# Patient Record
Sex: Male | Born: 1960
Health system: Southern US, Community
[De-identification: ages and names within clinical notes are randomized; demographics above are authoritative.]

## PROBLEM LIST (undated history)

## (undated) DIAGNOSIS — F0390 Unspecified dementia without behavioral disturbance: Secondary | ICD-10-CM

## (undated) DIAGNOSIS — L97509 Non-pressure chronic ulcer of other part of unspecified foot with unspecified severity: Secondary | ICD-10-CM

## (undated) DIAGNOSIS — I10 Essential (primary) hypertension: Secondary | ICD-10-CM

## (undated) DIAGNOSIS — L84 Corns and callosities: Secondary | ICD-10-CM

## (undated) DIAGNOSIS — Z973 Presence of spectacles and contact lenses: Secondary | ICD-10-CM

## (undated) DIAGNOSIS — E1165 Type 2 diabetes mellitus with hyperglycemia: Secondary | ICD-10-CM

## (undated) DIAGNOSIS — J309 Allergic rhinitis, unspecified: Principal | ICD-10-CM

## (undated) DIAGNOSIS — N185 Chronic kidney disease, stage 5: Secondary | ICD-10-CM

## (undated) DIAGNOSIS — E785 Hyperlipidemia, unspecified: Secondary | ICD-10-CM

## (undated) DIAGNOSIS — F1010 Alcohol abuse, uncomplicated: Secondary | ICD-10-CM

## (undated) DIAGNOSIS — D649 Anemia, unspecified: Secondary | ICD-10-CM

## (undated) DIAGNOSIS — J189 Pneumonia, unspecified organism: Secondary | ICD-10-CM

## (undated) DIAGNOSIS — K5730 Diverticulosis of large intestine without perforation or abscess without bleeding: Secondary | ICD-10-CM

## (undated) DIAGNOSIS — K529 Noninfective gastroenteritis and colitis, unspecified: Secondary | ICD-10-CM

## (undated) DIAGNOSIS — R7302 Impaired glucose tolerance (oral): Secondary | ICD-10-CM

## (undated) DIAGNOSIS — I639 Cerebral infarction, unspecified: Secondary | ICD-10-CM

## (undated) DIAGNOSIS — E7800 Pure hypercholesterolemia, unspecified: Secondary | ICD-10-CM

## (undated) DIAGNOSIS — M1990 Unspecified osteoarthritis, unspecified site: Secondary | ICD-10-CM

## (undated) DIAGNOSIS — J45909 Unspecified asthma, uncomplicated: Secondary | ICD-10-CM

## (undated) HISTORY — DX: Pneumonia, unspecified organism: J18.9

## (undated) HISTORY — DX: Alcohol abuse, uncomplicated: F10.10

## (undated) HISTORY — DX: Impaired glucose tolerance (oral): R73.02

## (undated) HISTORY — DX: Noninfective gastroenteritis and colitis, unspecified: K52.9

## (undated) HISTORY — DX: Diverticulosis of large intestine without perforation or abscess without bleeding: K57.30

## (undated) HISTORY — DX: Type 2 diabetes mellitus with hyperglycemia: E11.65

## (undated) HISTORY — DX: Corns and callosities: L84

## (undated) HISTORY — DX: Hyperlipidemia, unspecified: E78.5

## (undated) HISTORY — DX: Unspecified asthma, uncomplicated: J45.909

## (undated) HISTORY — DX: Unspecified dementia without behavioral disturbance: F03.90

## (undated) HISTORY — DX: Essential (primary) hypertension: I10

## (undated) HISTORY — DX: Anemia, unspecified: D64.9

## (undated) HISTORY — DX: Non-pressure chronic ulcer of other part of unspecified foot with unspecified severity: L97.509

## (undated) HISTORY — DX: Allergic rhinitis, unspecified: J30.9

---

## 1983-07-13 HISTORY — PX: TONSILLECTOMY: SUR1361

## 2009-10-28 ENCOUNTER — Inpatient Hospital Stay (HOSPITAL_COMMUNITY): Admission: EM | Admit: 2009-10-28 | Discharge: 2009-11-11 | Payer: Self-pay | Admitting: Emergency Medicine

## 2010-09-24 ENCOUNTER — Emergency Department: Payer: Self-pay | Admitting: Emergency Medicine

## 2010-09-29 LAB — GLUCOSE, CAPILLARY
Glucose-Capillary: 126 mg/dL — ABNORMAL HIGH (ref 70–99)
Glucose-Capillary: 130 mg/dL — ABNORMAL HIGH (ref 70–99)
Glucose-Capillary: 142 mg/dL — ABNORMAL HIGH (ref 70–99)
Glucose-Capillary: 146 mg/dL — ABNORMAL HIGH (ref 70–99)
Glucose-Capillary: 95 mg/dL (ref 70–99)
Glucose-Capillary: 100 mg/dL — ABNORMAL HIGH (ref 70–99)
Glucose-Capillary: 102 mg/dL — ABNORMAL HIGH (ref 70–99)
Glucose-Capillary: 107 mg/dL — ABNORMAL HIGH (ref 70–99)
Glucose-Capillary: 108 mg/dL — ABNORMAL HIGH (ref 70–99)
Glucose-Capillary: 109 mg/dL — ABNORMAL HIGH (ref 70–99)
Glucose-Capillary: 112 mg/dL — ABNORMAL HIGH (ref 70–99)
Glucose-Capillary: 114 mg/dL — ABNORMAL HIGH (ref 70–99)
Glucose-Capillary: 123 mg/dL — ABNORMAL HIGH (ref 70–99)
Glucose-Capillary: 129 mg/dL — ABNORMAL HIGH (ref 70–99)
Glucose-Capillary: 130 mg/dL — ABNORMAL HIGH (ref 70–99)
Glucose-Capillary: 133 mg/dL — ABNORMAL HIGH (ref 70–99)
Glucose-Capillary: 142 mg/dL — ABNORMAL HIGH (ref 70–99)
Glucose-Capillary: 143 mg/dL — ABNORMAL HIGH (ref 70–99)
Glucose-Capillary: 168 mg/dL — ABNORMAL HIGH (ref 70–99)
Glucose-Capillary: 187 mg/dL — ABNORMAL HIGH (ref 70–99)
Glucose-Capillary: 188 mg/dL — ABNORMAL HIGH (ref 70–99)
Glucose-Capillary: 62 mg/dL — ABNORMAL LOW (ref 70–99)
Glucose-Capillary: 93 mg/dL (ref 70–99)
Glucose-Capillary: 95 mg/dL (ref 70–99)
Glucose-Capillary: 96 mg/dL (ref 70–99)
Glucose-Capillary: 96 mg/dL (ref 70–99)
Glucose-Capillary: 97 mg/dL (ref 70–99)
Glucose-Capillary: 97 mg/dL (ref 70–99)
Glucose-Capillary: 99 mg/dL (ref 70–99)
Glucose-Capillary: 99 mg/dL (ref 70–99)

## 2010-09-29 LAB — LIPID PANEL
Cholesterol: 144 mg/dL (ref 0–200)
HDL: 27 mg/dL — ABNORMAL LOW (ref >39)
Total CHOL/HDL Ratio: 5.3 RATIO
Triglycerides: 145 mg/dL (ref <150)

## 2010-09-29 LAB — BASIC METABOLIC PANEL
BUN: 6 mg/dL (ref 6–23)
BUN: 7 mg/dL (ref 6–23)
BUN: 8 mg/dL (ref 6–23)
BUN: 8 mg/dL (ref 6–23)
CO2: 25 mEq/L (ref 19–32)
CO2: 25 mEq/L (ref 19–32)
CO2: 28 mEq/L (ref 19–32)
Calcium: 9.1 mg/dL (ref 8.4–10.5)
Calcium: 8.3 mg/dL — ABNORMAL LOW (ref 8.4–10.5)
Calcium: 9 mg/dL (ref 8.4–10.5)
Chloride: 105 mEq/L (ref 96–112)
Chloride: 101 mEq/L (ref 96–112)
Chloride: 102 mEq/L (ref 96–112)
Chloride: 104 mEq/L (ref 96–112)
Chloride: 105 mEq/L (ref 96–112)
Creatinine, Ser: 1.16 mg/dL (ref 0.4–1.5)
Creatinine, Ser: 1.37 mg/dL (ref 0.4–1.5)
Creatinine, Ser: 1.43 mg/dL (ref 0.4–1.5)
GFR calc Af Amer: >60 mL/min (ref >60)
GFR calc non Af Amer: 52 mL/min — ABNORMAL LOW (ref >60)
GFR calc non Af Amer: >60 mL/min (ref >60)
Glucose, Bld: 88 mg/dL (ref 70–99)
Glucose, Bld: 103 mg/dL — ABNORMAL HIGH (ref 70–99)
Glucose, Bld: 127 mg/dL — ABNORMAL HIGH (ref 70–99)
Glucose, Bld: 91 mg/dL (ref 70–99)
Potassium: 3.4 mEq/L — ABNORMAL LOW (ref 3.5–5.1)
Potassium: 3.3 mEq/L — ABNORMAL LOW (ref 3.5–5.1)
Potassium: 3.9 mEq/L (ref 3.5–5.1)
Sodium: 135 mEq/L (ref 135–145)
Sodium: 137 mEq/L (ref 135–145)

## 2010-09-29 LAB — FERRITIN: Ferritin: 1607 ng/mL — ABNORMAL HIGH (ref 22–322)

## 2010-09-29 LAB — RETICULOCYTES
RBC.: 3.01 MIL/uL — ABNORMAL LOW (ref 4.22–5.81)
Retic Count, Absolute: 39.1 10*3/uL (ref 19.0–186.0)
Retic Ct Pct: 1.3 % (ref 0.4–3.1)

## 2010-09-29 LAB — CULTURE, BLOOD (ROUTINE X 2)

## 2010-09-29 LAB — URINALYSIS, ROUTINE W REFLEX MICROSCOPIC
Glucose, UA: NEGATIVE mg/dL
Ketones, ur: 15 mg/dL — AB
pH: 5.5 (ref 5.0–8.0)

## 2010-09-29 LAB — CBC
HCT: 25.6 % — ABNORMAL LOW (ref 39.0–52.0)
HCT: 28 % — ABNORMAL LOW (ref 39.0–52.0)
HCT: 28.2 % — ABNORMAL LOW (ref 39.0–52.0)
Hemoglobin: 8.9 g/dL — ABNORMAL LOW (ref 13.0–17.0)
Hemoglobin: 9.8 g/dL — ABNORMAL LOW (ref 13.0–17.0)
MCHC: 34.4 g/dL (ref 30.0–36.0)
MCV: 104 fL — ABNORMAL HIGH (ref 78.0–100.0)
MCV: 104.7 fL — ABNORMAL HIGH (ref 78.0–100.0)
MCV: 105 fL — ABNORMAL HIGH (ref 78.0–100.0)
MCV: 106 fL — ABNORMAL HIGH (ref 78.0–100.0)
Platelets: 413 10*3/uL — ABNORMAL HIGH (ref 150–400)
Platelets: 459 10*3/uL — ABNORMAL HIGH (ref 150–400)
RBC: 2.44 MIL/uL — ABNORMAL LOW (ref 4.22–5.81)
RBC: 2.66 MIL/uL — ABNORMAL LOW (ref 4.22–5.81)
RDW: 15.6 % — ABNORMAL HIGH (ref 11.5–15.5)
WBC: 10.7 10*3/uL — ABNORMAL HIGH (ref 4.0–10.5)
WBC: 12.4 10*3/uL — ABNORMAL HIGH (ref 4.0–10.5)
WBC: 12.7 10*3/uL — ABNORMAL HIGH (ref 4.0–10.5)

## 2010-09-29 LAB — DIFFERENTIAL
Basophils Absolute: 0.1 10*3/uL (ref 0.0–0.1)
Eosinophils Absolute: 0.5 10*3/uL (ref 0.0–0.7)
Eosinophils Absolute: 0.5 10*3/uL (ref 0.0–0.7)
Lymphocytes Relative: 20 % (ref 12–46)
Lymphs Abs: 2.4 10*3/uL (ref 0.7–4.0)
Lymphs Abs: 2.4 10*3/uL (ref 0.7–4.0)
Monocytes Absolute: 1.1 10*3/uL — ABNORMAL HIGH (ref 0.1–1.0)
Monocytes Relative: 9 % (ref 3–12)
Neutro Abs: 7.2 10*3/uL (ref 1.7–7.7)
Neutro Abs: 8.3 10*3/uL — ABNORMAL HIGH (ref 1.7–7.7)
Neutrophils Relative %: 67 % (ref 43–77)
Neutrophils Relative %: 67 % (ref 43–77)

## 2010-09-29 LAB — URINE MICROSCOPIC-ADD ON

## 2010-09-29 LAB — HEMOGLOBIN A1C: Mean Plasma Glucose: 94 mg/dL (ref <117)

## 2010-09-29 LAB — FOLATE: Folate: 7.1 ng/mL

## 2010-09-29 LAB — IRON AND TIBC
Iron: 72 ug/dL (ref 42–135)
UIBC: <55 ug/dL

## 2011-08-16 ENCOUNTER — Encounter (HOSPITAL_COMMUNITY): Payer: Self-pay

## 2011-08-16 ENCOUNTER — Emergency Department (INDEPENDENT_AMBULATORY_CARE_PROVIDER_SITE_OTHER)
Admission: EM | Admit: 2011-08-16 | Discharge: 2011-08-16 | Disposition: A | Discharge status: Home / Self Care | Payer: Self-pay | Source: Home / Self Care | Attending: Family Medicine | Admitting: Family Medicine

## 2011-08-16 DIAGNOSIS — I10 Essential (primary) hypertension: Secondary | ICD-10-CM

## 2011-08-16 HISTORY — DX: Essential (primary) hypertension: I10

## 2011-08-16 MED ORDER — LISINOPRIL 40 MG PO TABS: 40.0000 mg | ORAL_TABLET | Freq: Every day | ORAL | Status: DC

## 2011-08-16 NOTE — ED Provider Notes (Signed)
History     CSN: [id number]  Arrival date & time 08/16/11  45   First MD Initiated Contact with Patient 08/16/11 1410      Chief Complaint  Patient presents with  . Medication Refill    (Consider location/radiation/quality/duration/timing/severity/associated sxs/prior treatment) Patient is a 51 y.o. male presenting with hypertension. The history is provided by the patient and a relative.  Hypertension This is a chronic problem. Episode onset: released from extended care and needs med refill until seeing lmd in 1 mo., has been on med for 2 yrs. The problem has not changed since onset.Pertinent negatives include no chest pain, no headaches and no shortness of breath.    Past Medical History  Diagnosis Date  . Diabetes mellitus   . Hypertension     History reviewed. No pertinent past surgical history.  No family history on file.  History  Substance Use Topics  . Smoking status: Current Everyday Smoker  . Smokeless tobacco: Not on file  . Alcohol Use: Yes      Review of Systems  Constitutional: Negative.   Respiratory: Negative for shortness of breath.   Cardiovascular: Negative for chest pain, palpitations and leg swelling.  Neurological: Negative for headaches.    Allergies  Review of patient's allergies indicates no known allergies.  Home Medications   Current Outpatient Rx  Name Route Sig Dispense Refill  . DILTIAZEM HCL 30 MG PO TABS Oral Take 30 mg by mouth 3 (three) times daily.    Marland Kitchen LISINOPRIL 40 MG PO TABS Oral Take 40 mg by mouth daily.    Marland Kitchen METFORMIN HCL 500 MG PO TABS Oral Take 500 mg by mouth 2 (two) times daily with a meal.    . ONE-DAILY MULTI VITAMINS PO TABS Oral Take 1 tablet by mouth daily.    Marland Kitchen LISINOPRIL 40 MG PO TABS Oral Take 1 tablet (40 mg total) by mouth daily. 30 tablet 1    BP 162/90  Pulse 83  Temp(Src) 98.4 F (36.9 C) (Oral)  Resp 20  SpO2 98%  Physical Exam  Nursing note and vitals reviewed. Constitutional: He is  oriented to person, place, and time. He appears well-developed and well-nourished.  Cardiovascular: Normal rate, regular rhythm, normal heart sounds and intact distal pulses.   Pulmonary/Chest: Effort normal and breath sounds normal.  Musculoskeletal: He exhibits no edema.  Neurological: He is alert and oriented to person, place, and time.  Skin: Skin is warm and dry.    ED Course  Procedures (including critical care time)  Labs Reviewed - No data to display No results found.   1. Hypertension       MDM          Pauline Good, MD 08/16/11 [phone]

## 2011-08-16 NOTE — ED Notes (Signed)
Needs refill on lisinopril.  Has appt with Dr Jenny Reichmann on March 11th- just moving out of assisted living facility.

## 2011-09-18 ENCOUNTER — Encounter: Payer: Self-pay | Admitting: Internal Medicine

## 2011-09-18 DIAGNOSIS — Z0000 Encounter for general adult medical examination without abnormal findings: Secondary | ICD-10-CM | POA: Insufficient documentation

## 2011-09-18 DIAGNOSIS — I10 Essential (primary) hypertension: Secondary | ICD-10-CM

## 2011-09-18 DIAGNOSIS — R7302 Impaired glucose tolerance (oral): Secondary | ICD-10-CM

## 2011-09-18 DIAGNOSIS — Z111 Encounter for screening for respiratory tuberculosis: Secondary | ICD-10-CM | POA: Insufficient documentation

## 2011-09-18 DIAGNOSIS — D649 Anemia, unspecified: Secondary | ICD-10-CM

## 2011-09-18 DIAGNOSIS — K529 Noninfective gastroenteritis and colitis, unspecified: Secondary | ICD-10-CM

## 2011-09-18 DIAGNOSIS — L97509 Non-pressure chronic ulcer of other part of unspecified foot with unspecified severity: Secondary | ICD-10-CM

## 2011-09-18 DIAGNOSIS — J189 Pneumonia, unspecified organism: Secondary | ICD-10-CM

## 2011-09-18 DIAGNOSIS — F1010 Alcohol abuse, uncomplicated: Secondary | ICD-10-CM

## 2011-09-18 DIAGNOSIS — F0390 Unspecified dementia without behavioral disturbance: Secondary | ICD-10-CM

## 2011-09-18 HISTORY — DX: Pneumonia, unspecified organism: J18.9

## 2011-09-18 HISTORY — DX: Unspecified dementia, unspecified severity, without behavioral disturbance, psychotic disturbance, mood disturbance, and anxiety: F03.90

## 2011-09-18 HISTORY — DX: Essential (primary) hypertension: I10

## 2011-09-18 HISTORY — DX: Noninfective gastroenteritis and colitis, unspecified: K52.9

## 2011-09-18 HISTORY — DX: Non-pressure chronic ulcer of other part of unspecified foot with unspecified severity: L97.509

## 2011-09-18 HISTORY — DX: Impaired glucose tolerance (oral): R73.02

## 2011-09-18 HISTORY — DX: Alcohol abuse, uncomplicated: F10.10

## 2011-09-18 HISTORY — DX: Anemia, unspecified: D64.9

## 2011-09-20 ENCOUNTER — Encounter: Payer: Self-pay | Admitting: Internal Medicine

## 2011-09-20 ENCOUNTER — Other Ambulatory Visit (INDEPENDENT_AMBULATORY_CARE_PROVIDER_SITE_OTHER): Payer: Self-pay

## 2011-09-20 ENCOUNTER — Ambulatory Visit (INDEPENDENT_AMBULATORY_CARE_PROVIDER_SITE_OTHER): Payer: Self-pay | Admitting: Internal Medicine

## 2011-09-20 VITALS — BP 132/82 | HR 83 | Temp 97.7°F | Ht 72.0 in | Wt 164.0 lb

## 2011-09-20 DIAGNOSIS — J309 Allergic rhinitis, unspecified: Secondary | ICD-10-CM

## 2011-09-20 DIAGNOSIS — J45909 Unspecified asthma, uncomplicated: Secondary | ICD-10-CM | POA: Insufficient documentation

## 2011-09-20 DIAGNOSIS — IMO0001 Reserved for inherently not codable concepts without codable children: Secondary | ICD-10-CM

## 2011-09-20 DIAGNOSIS — E1129 Type 2 diabetes mellitus with other diabetic kidney complication: Secondary | ICD-10-CM | POA: Insufficient documentation

## 2011-09-20 DIAGNOSIS — I10 Essential (primary) hypertension: Secondary | ICD-10-CM

## 2011-09-20 HISTORY — DX: Allergic rhinitis, unspecified: J30.9

## 2011-09-20 HISTORY — DX: Unspecified asthma, uncomplicated: J45.909

## 2011-09-20 HISTORY — DX: Reserved for inherently not codable concepts without codable children: IMO0001

## 2011-09-20 LAB — BASIC METABOLIC PANEL
Chloride: 100 mEq/L (ref 96–112)
Potassium: 4.2 mEq/L (ref 3.5–5.1)

## 2011-09-20 LAB — LIPID PANEL
Total CHOL/HDL Ratio: 8
VLDL: 44.6 mg/dL — ABNORMAL HIGH (ref 0.0–40.0)

## 2011-09-20 MED ORDER — LISINOPRIL 40 MG PO TABS: 40.0000 mg | ORAL_TABLET | Freq: Every day | ORAL | Status: DC

## 2011-09-20 MED ORDER — DILTIAZEM HCL 30 MG PO TABS: 30.0000 mg | ORAL_TABLET | Freq: Three times a day (TID) | ORAL | Status: DC

## 2011-09-20 MED ORDER — METFORMIN HCL 500 MG PO TABS: 500.0000 mg | ORAL_TABLET | Freq: Two times a day (BID) | ORAL | Status: DC

## 2011-09-20 NOTE — Assessment & Plan Note (Signed)
stable overall by hx and exam, most recent data reviewed with pt, and pt to continue medical treatment as before  BP Readings from Last 3 Encounters:  09/20/11 132/82  08/16/11 162/90

## 2011-09-20 NOTE — Assessment & Plan Note (Signed)
stable overall by hx and exam, most recent data reviewed with pt, and pt to continue medical treatment as before Lab Results  Component Value Date   HGBA1C  Value: 4.9 (NOTE)                                                                       According to the ADA Clinical Practice Recommendations for 2011, when HbA1c is used as a screening test:   >=6.5%   Diagnostic of Diabetes Mellitus           (if abnormal result  is confirmed)  5.7-6.4%   Increased risk of developing Diabetes Mellitus  References:Diagnosis and Classification of Diabetes Mellitus,Diabetes [id number] 1):S62-S69 and Standards of Medical Care in         Diabetes - 2011,Diabetes [id number]  (Suppl 1):S11-S61. 11/02/2009

## 2011-09-20 NOTE — Patient Instructions (Signed)
Please go to the Disability office on [facility], to see if you are eligible for Medicare  Please try the Dymista samples for nasal allergies at one spray to each side of the nose  - Twice per day After that , please take Allegra OTC 180 mg per day for allergies Continue all other medications as before Your metformin was sent to the [facility] Please go to LAB in the Basement for the blood tests to be done today Please call the phone number [phone] (the [facility]) for results of testing in 2-3 days;  When calling, simply dial the number, and when prompted enter the MRN number above (the Medical Record Number) and the # key, then the message should start. Please return in 6 months, or sooner if needed

## 2011-09-20 NOTE — Progress Notes (Signed)
  Subjective:    Patient ID: Christopher Lopez, male    DOB: [birth year], 51 y.o.   MRN: [id number]  HPI  Here as new pt to establish, hx limited due to dementia, here alone;  Does have several wks ongoing nasal allergy symptoms with clear congestion, itch and sneeze, without fever, pain, ST, cough or wheezing.  Pt denies chest pain, increased sob or doe, wheezing, orthopnea, PND, increased LE swelling, palpitations, dizziness or syncope.  Pt denies new neurological symptoms such as new headache, or facial or extremity weakness or numbness   Pt denies polydipsia, polyuria, or low sugar symptoms such as weakness or confusion improved with po intake.  Pt states overall good compliance with meds, trying to follow lower cholesterol, diabetic diet, wt overall stable but little exercise however.    Past Medical History  Diagnosis Date  . Diabetes mellitus   . Hypertension   . Pneumonia 09/18/2011  . Anemia, unspecified 09/18/2011  . HTN (hypertension) 09/18/2011  . Impaired glucose tolerance 09/18/2011  . Colitis 09/18/2011  . Dementia 09/18/2011  . Alcohol abuse 09/18/2011  . Foot ulcer 09/18/2011  . Allergic rhinitis, cause unspecified 09/20/2011  . Type II or unspecified type diabetes mellitus without mention of complication, uncontrolled 09/20/2011  . Childhood asthma 09/20/2011   Past Surgical History  Procedure Date  . Tonsillectomy     reports that he has been smoking.  He does not have any smokeless tobacco history on file. He reports that he drinks alcohol. He reports that he does not use illicit drugs. family history is not on file. No Known Allergies Current Outpatient Prescriptions on File Prior to Visit  Medication Sig Dispense Refill  . Multiple Vitamin (MULTIVITAMIN) tablet Take 1 tablet by mouth daily.       Review of Systems Review of Systems  Constitutional: Negative for diaphoresis and unexpected weight change.  HENT: Negative for drooling and tinnitus.   Eyes: Negative for photophobia and  visual disturbance.  Respiratory: Negative for choking and stridor.   Gastrointestinal: Negative for vomiting and blood in stool.  Genitourinary: Negative for hematuria and decreased urine volume.       Objective:   Physical Exam BP 132/82  Pulse 83  Temp(Src) 97.7 F (36.5 C) (Oral)  Ht 6' (1.829 m)  Wt 164 lb (74.39 kg)  BMI 22.24 kg/m2  SpO2 99% Physical Exam  VS noted Constitutional: Pt appears well-developed and well-nourished.  HENT: Head: Normocephalic.  Right Ear: External ear normal.  Left Ear: External ear normal.  Bilat tm's mild erythema.  Sinus nontender.  Pharynx mild erythema Eyes: Conjunctivae and EOM are normal. Pupils are equal, round, and reactive to light.  Neck: Normal range of motion. Neck supple.  Cardiovascular: Normal rate and regular rhythm.   Pulmonary/Chest: Effort normal and breath sounds normal.  Abd:  Soft, NT, non-distended, + BS Neurological: Pt is alert. No cranial nerve deficit.  Skin: Skin is warm. No erythema.     Assessment & Plan:

## 2011-09-20 NOTE — Assessment & Plan Note (Signed)
Gave sample dymista, and to use allegra after that,  to f/u any worsening symptoms or concerns

## 2011-09-21 ENCOUNTER — Telehealth: Payer: Self-pay | Admitting: Internal Medicine

## 2011-09-21 LAB — LDL CHOLESTEROL, DIRECT: Direct LDL: 214.6 mg/dL

## 2011-09-21 MED ORDER — LOVASTATIN 40 MG PO TABS: 40.0000 mg | ORAL_TABLET | Freq: Every day | ORAL | Status: DC

## 2011-09-21 MED ORDER — GLIPIZIDE 5 MG PO TABS: 5.0000 mg | ORAL_TABLET | Freq: Two times a day (BID) | ORAL | Status: DC

## 2011-09-21 NOTE — Telephone Encounter (Signed)
Message copied by Biagio Borg on Tue Sep 21, 2011  5:20 PM ------      Message from: Jamesetta Orleans      Created: Tue Sep 21, 2011  3:14 PM       Called the patient informed of results.  The patient informed he has been taking his metformin daily

## 2011-09-21 NOTE — Telephone Encounter (Signed)
Called informed the patient of medication additions we well as ROV in 2 months with labs.

## 2011-09-21 NOTE — Telephone Encounter (Signed)
If he has been taking all of his medication, he will need further med, both for DM and elevated cholesterol     1)  To start glipizide 5 mg twice per day    2)   Start lovastatin 40 mg per day  ROV 2 months to re-check labs

## 2011-11-08 ENCOUNTER — Encounter: Payer: Self-pay | Admitting: Internal Medicine

## 2011-11-08 ENCOUNTER — Ambulatory Visit (INDEPENDENT_AMBULATORY_CARE_PROVIDER_SITE_OTHER): Payer: Self-pay | Admitting: Internal Medicine

## 2011-11-08 ENCOUNTER — Other Ambulatory Visit: Payer: Self-pay | Admitting: Internal Medicine

## 2011-11-08 ENCOUNTER — Other Ambulatory Visit (INDEPENDENT_AMBULATORY_CARE_PROVIDER_SITE_OTHER): Payer: Self-pay

## 2011-11-08 VITALS — BP 142/92 | HR 81 | Ht 72.0 in | Wt 167.8 lb

## 2011-11-08 DIAGNOSIS — IMO0001 Reserved for inherently not codable concepts without codable children: Secondary | ICD-10-CM

## 2011-11-08 DIAGNOSIS — I10 Essential (primary) hypertension: Secondary | ICD-10-CM

## 2011-11-08 DIAGNOSIS — E785 Hyperlipidemia, unspecified: Secondary | ICD-10-CM

## 2011-11-08 HISTORY — DX: Hyperlipidemia, unspecified: E78.5

## 2011-11-08 LAB — BASIC METABOLIC PANEL
CO2: 29 mEq/L (ref 19–32)
Calcium: 9.4 mg/dL (ref 8.4–10.5)
Creatinine, Ser: 1.2 mg/dL (ref 0.4–1.5)
Glucose, Bld: 195 mg/dL — ABNORMAL HIGH (ref 70–99)

## 2011-11-08 LAB — HEPATIC FUNCTION PANEL
Albumin: 3.6 g/dL (ref 3.5–5.2)
Alkaline Phosphatase: 77 U/L (ref 39–117)
Total Protein: 8.1 g/dL (ref 6.0–8.3)

## 2011-11-08 LAB — HEMOGLOBIN A1C: Hgb A1c MFr Bld: 9.7 % — ABNORMAL HIGH (ref 4.6–6.5)

## 2011-11-08 LAB — LIPID PANEL
Cholesterol: 175 mg/dL (ref 0–200)
HDL: 45.6 mg/dL (ref >39.00)
Triglycerides: 91 mg/dL (ref 0.0–149.0)

## 2011-11-08 MED ORDER — HYDROCHLOROTHIAZIDE 12.5 MG PO TABS: 12.5000 mg | ORAL_TABLET | Freq: Every day | ORAL | Status: DC

## 2011-11-08 MED ORDER — ASPIRIN 81 MG PO TBEC: 81.0000 mg | DELAYED_RELEASE_TABLET | Freq: Every day | ORAL | Status: AC

## 2011-11-08 MED ORDER — METFORMIN HCL 1000 MG PO TABS: 1000.0000 mg | ORAL_TABLET | Freq: Two times a day (BID) | ORAL | Status: DC

## 2011-11-08 NOTE — Assessment & Plan Note (Signed)
Severe uncontroled last visit, now with better diet, on mevacor, to cont as is, goal ldl < 70, for lipids today

## 2011-11-08 NOTE — Patient Instructions (Addendum)
Take all new medications as prescribed  - the hydrochlorothiazide 12.5 mg per day, for Blood Pressure Please also start Aspirin 81 mg - 1 per day - COATED only, to reduce risk of stroke and heart attack Continue all other medications as before Please go to LAB in the Basement for the blood and/or urine tests to be done today You will be contacted by phone if any changes need to be made immediately.  Otherwise, you will receive a letter about your results with an explanation. Please return in 6 months with Lab testing done 3-5 days before

## 2011-11-08 NOTE — Progress Notes (Signed)
Subjective:    Patient ID: Christopher Lopez, male    DOB: [birth year], 51 y.o.   MRN: [id number]  HPI  Here to f/u with severe uncontrolled DM/chol/BP last visit when re-established;  Now back to usual meds and Overall good compliance with treatment, and good medicine tolerability, including the statin.  Pt denies chest pain, increased sob or doe, wheezing, orthopnea, PND, increased LE swelling, palpitations, dizziness or syncope.  Pt denies new neurological symptoms such as new headache, or facial or extremity weakness or numbness   Pt denies polydipsia, polyuria, and no low sugars.   Pt states overall good compliance with meds, trying to follow lower cholesterol, diabetic diet, wt overall stable but little exercise however. No new complaints.  Not taking ASA, No recent fever Past Medical History  Diagnosis Date  . Diabetes mellitus   . Hypertension   . Pneumonia 09/18/2011  . Anemia, unspecified 09/18/2011  . HTN (hypertension) 09/18/2011  . Impaired glucose tolerance 09/18/2011  . Colitis 09/18/2011  . Dementia 09/18/2011  . Alcohol abuse 09/18/2011  . Foot ulcer 09/18/2011  . Allergic rhinitis, cause unspecified 09/20/2011  . Type II or unspecified type diabetes mellitus without mention of complication, uncontrolled 09/20/2011  . Childhood asthma 09/20/2011   Past Surgical History  Procedure Date  . Tonsillectomy     reports that he has been smoking.  He does not have any smokeless tobacco history on file. He reports that he drinks alcohol. He reports that he does not use illicit drugs. family history is not on file. No Known Allergies Current Outpatient Prescriptions on File Prior to Visit  Medication Sig Dispense Refill  . diltiazem (CARDIZEM) 30 MG tablet Take 1 tablet (30 mg total) by mouth 3 (three) times daily.  270 tablet  3  . glipiZIDE (GLUCOTROL) 5 MG tablet Take 1 tablet (5 mg total) by mouth 2 (two) times daily before a meal.  180 tablet  3  . lisinopril (PRINIVIL,ZESTRIL) 40 MG tablet Take  1 tablet (40 mg total) by mouth daily.  90 tablet  3  . lovastatin (MEVACOR) 40 MG tablet Take 1 tablet (40 mg total) by mouth daily.  90 tablet  3  . metFORMIN (GLUCOPHAGE) 500 MG tablet Take 1 tablet (500 mg total) by mouth 2 (two) times daily with a meal.  180 tablet  3  . Multiple Vitamin (MULTIVITAMIN) tablet Take 1 tablet by mouth daily.       Review of Systems Review of Systems  Constitutional: Negative for diaphoresis and unexpected weight change.  Eyes: Negative for photophobia and visual disturbance.  Respiratory: Negative for choking and stridor.   Gastrointestinal: Negative for vomiting and blood in stool.  Genitourinary: Negative for hematuria and decreased urine volume.  Musculoskeletal: Negative for gait problem.  Skin: Negative for color change and wound.  Neurological: Negative for tremors and numbness.  Psychiatric/Behavioral: Negative for decreased concentration. The patient is not hyperactive.       Objective:   Physical Exam BP 142/92  Pulse 81  Ht 6' (1.829 m)  Wt 167 lb 12.8 oz (76.114 kg)  BMI 22.76 kg/m2  SpO2 98% Physical Exam  VS noted Constitutional: Pt appears well-developed and well-nourished.  HENT: Head: Normocephalic.  Right Ear: External ear normal.  Left Ear: External ear normal.  Eyes: Conjunctivae and EOM are normal. Pupils are equal, round, and reactive to light.  Neck: Normal range of motion. Neck supple.  Cardiovascular: Normal rate and regular rhythm.   Pulmonary/Chest: Effort normal  and breath sounds normal.  Neurological: Pt is alert. Not confused Skin: Skin is warm. No erythema.  Psychiatric: Pt behavior is normal. Thought content normal. not depressed affect or nervous    Assessment & Plan:

## 2011-11-08 NOTE — Assessment & Plan Note (Signed)
stable overall by hx and exam, most recent data reviewed with pt, and pt to continue medical treatment as before  Lab Results  Component Value Date   HGBA1C 11.5* 09/20/2011   For f/u lab today

## 2011-11-08 NOTE — Assessment & Plan Note (Addendum)
Still mild uncontrolled, to add HCTZ 12.5 qd, follwup  BP at home and next visit, to also start asa 81 mg per day  BP Readings from Last 3 Encounters:  09/20/11 132/82  08/16/11 162/90

## 2011-11-19 ENCOUNTER — Ambulatory Visit (INDEPENDENT_AMBULATORY_CARE_PROVIDER_SITE_OTHER): Payer: Self-pay | Admitting: Internal Medicine

## 2011-11-19 VITALS — BP 150/90 | HR 93 | Temp 98.1°F | Wt 164.0 lb

## 2011-11-19 DIAGNOSIS — L84 Corns and callosities: Secondary | ICD-10-CM

## 2011-11-19 DIAGNOSIS — E785 Hyperlipidemia, unspecified: Secondary | ICD-10-CM

## 2011-11-19 DIAGNOSIS — IMO0001 Reserved for inherently not codable concepts without codable children: Secondary | ICD-10-CM

## 2011-11-19 DIAGNOSIS — I10 Essential (primary) hypertension: Secondary | ICD-10-CM

## 2011-11-19 DIAGNOSIS — M79662 Pain in left lower leg: Secondary | ICD-10-CM

## 2011-11-19 MED ORDER — HYDROCHLOROTHIAZIDE 25 MG PO TABS: 25.0000 mg | ORAL_TABLET | Freq: Every day | ORAL | Status: DC

## 2011-11-19 MED ORDER — SITAGLIPTIN PHOSPHATE 100 MG PO TABS: 100.0000 mg | ORAL_TABLET | Freq: Every day | ORAL | Status: DC

## 2011-11-19 MED ORDER — GLIPIZIDE 10 MG PO TABS: 10.0000 mg | ORAL_TABLET | Freq: Two times a day (BID) | ORAL | Status: DC

## 2011-11-19 NOTE — Patient Instructions (Addendum)
OK to stop the hydrochlorothiazide 12.5 mg fluid pill Please start the hydrochlorothiazide 25 mg fluid pill Increase the glipizide to 10 mg twice per day Please start the Januvia 100 mg - 1 per day; start with the samples today, and the 30 and 90 day prescriptions are given as well as the form filled out for the [facility] patient assistance program If you are not approved for Januvia, please let us know so that we can another direction such as Lantus with their assistance program Continue all other medications as before Please return in 3 mo with Lab testing done 3-5 days before Please consider going to the [facility] for your foot exam and care

## 2011-11-20 ENCOUNTER — Encounter: Payer: Self-pay | Admitting: Internal Medicine

## 2011-11-20 DIAGNOSIS — M79662 Pain in left lower leg: Secondary | ICD-10-CM | POA: Insufficient documentation

## 2011-11-20 DIAGNOSIS — L84 Corns and callosities: Secondary | ICD-10-CM

## 2011-11-20 HISTORY — DX: Corns and callosities: L84

## 2011-11-20 NOTE — Assessment & Plan Note (Signed)
Improved but a1c still 9.7, for glipizide incr to 10 bid, add januvia 100 qd (with form filled out for [place] pt assist program, cont all other meds, gave 3 wks sample januvia as well Lab Results  Component Value Date   HGBA1C 9.7* 11/08/2011

## 2011-11-20 NOTE — Assessment & Plan Note (Signed)
No ulcer, ideally needs referral to podiatry but declines due to cost

## 2011-11-20 NOTE — Assessment & Plan Note (Addendum)
stable overall by hx and exam, most recent data reviewed with pt, and pt to increase the hctz to 25 mg qd  BP Readings from Last 3 Encounters:  11/20/11 150/90  11/08/11 142/92  09/20/11 132/82

## 2011-11-20 NOTE — Progress Notes (Signed)
Subjective:    Patient ID: Christopher Lopez, male    DOB: Jul 26, 1960, 51 y.o.   MRN: [id number]  HPI  Here to f/u with family member present;  Pt c/o pain to left calf for 1 wk, mild, sharp, persistent but worse to walk, better to sit, not assoc with trauma, fever,redness or swelling, not sure how started. Also has very dry skin to the LE's with severe dryness and cracking of skin but no redness, swelling, drainage.  Has callous to the left great toe but no ulcers.  Pt denies chest pain, increased sob or doe, wheezing, orthopnea, PND, increased LE swelling, palpitations, dizziness or syncope.  Pt denies new neurological symptoms such as new headache, or facial or extremity weakness or numbness  Pt denies polydipsia, polyuria, or low sugar symptoms such as weakness or confusion improved with po intake.  Pt states overall good compliance with meds, trying to follow lower cholesterol, diabetic diet, wt overall stable but little exercise however.    Past Medical History  Diagnosis Date  . Diabetes mellitus   . Hypertension   . Pneumonia 09/18/2011  . Anemia, unspecified 09/18/2011  . HTN (hypertension) 09/18/2011  . Impaired glucose tolerance 09/18/2011  . Colitis 09/18/2011  . Dementia 09/18/2011  . Alcohol abuse 09/18/2011  . Foot ulcer 09/18/2011  . Allergic rhinitis, cause unspecified 09/20/2011  . Type II or unspecified type diabetes mellitus without mention of complication, uncontrolled 09/20/2011  . Childhood asthma 09/20/2011  . Hyperlipidemia 11/08/2011   Past Surgical History  Procedure Date  . Tonsillectomy     reports that he has been smoking.  He does not have any smokeless tobacco history on file. He reports that he drinks alcohol. He reports that he does not use illicit drugs. family history is not on file. No Known Allergies Current Outpatient Prescriptions on File Prior to Visit  Medication Sig Dispense Refill  . aspirin 81 MG EC tablet Take 1 tablet (81 mg total) by mouth daily. Swallow whole.   30 tablet  12  . diltiazem (CARDIZEM) 30 MG tablet Take 1 tablet (30 mg total) by mouth 3 (three) times daily.  270 tablet  3  . lisinopril (PRINIVIL,ZESTRIL) 40 MG tablet Take 1 tablet (40 mg total) by mouth daily.  90 tablet  3  . lovastatin (MEVACOR) 40 MG tablet Take 1 tablet (40 mg total) by mouth daily.  90 tablet  3  . metFORMIN (GLUCOPHAGE) 1000 MG tablet Take 1 tablet (1,000 mg total) by mouth 2 (two) times daily with a meal.  180 tablet  3  . Multiple Vitamin (MULTIVITAMIN) tablet Take 1 tablet by mouth daily.      Marland Kitchen glipiZIDE (GLUCOTROL) 10 MG tablet Take 1 tablet (10 mg total) by mouth 2 (two) times daily before a meal.  180 tablet  3  . sitaGLIPtin (JANUVIA) 100 MG tablet Take 1 tablet (100 mg total) by mouth daily.  30 tablet  11   Review of Systems Review of Systems  Constitutional: Negative for diaphoresis and unexpected weight change.  HENT: Negative for drooling and tinnitus.   Eyes: Negative for photophobia and visual disturbance.  Respiratory: Negative for choking and stridor.   Gastrointestinal: Negative for vomiting and blood in stool.  Genitourinary: Negative for hematuria and decreased urine volume.  Musculoskeletal: Negative for gait problem.  Neurological: Negative for tremors and numbness.     Objective:   Physical Exam BP 150/90  Pulse 93  Temp 98.1 F (36.7 C)  Wt  164 lb (74.39 kg)  SpO2 95% Physical Exam  VS noted, not ill appearing Constitutional: Pt appears well-developed and well-nourished.  HENT: Head: Normocephalic.  Right Ear: External ear normal.  Left Ear: External ear normal.  Eyes: Conjunctivae and EOM are normal. Pupils are equal, round, and reactive to light.  Neck: Normal range of motion. Neck supple.  Cardiovascular: Normal rate and regular rhythm.   Pulmonary/Chest: Effort normal and breath sounds normal.  Neurological: Pt is alert. Left calf mild tender without cord, neg homans, no swelling, no skin change Skin: Skin is warm. No  erythema.Marked callous to right medial great toe, nontender , no ulcer , marked dryness to distal LE's    Assessment & Plan:

## 2011-11-20 NOTE — Assessment & Plan Note (Signed)
C/w strain, for tylenol prn,  to f/u any worsening symptoms or concerns

## 2011-11-20 NOTE — Assessment & Plan Note (Signed)
Lab Results  Component Value Date   LDLCALC 111* 11/08/2011   Still elev, to cont statin as is, should improve as well with better DM control

## 2012-01-14 ENCOUNTER — Other Ambulatory Visit: Payer: Self-pay | Admitting: *Deleted

## 2012-01-14 MED ORDER — LOVASTATIN 40 MG PO TABS: 40.0000 mg | ORAL_TABLET | Freq: Every day | ORAL | Status: DC

## 2012-01-14 NOTE — Telephone Encounter (Signed)
Medication Rx refilled losartent sent to [facility]

## 2012-02-17 ENCOUNTER — Other Ambulatory Visit (INDEPENDENT_AMBULATORY_CARE_PROVIDER_SITE_OTHER): Payer: Self-pay

## 2012-02-17 LAB — BASIC METABOLIC PANEL
Chloride: 100 mEq/L (ref 96–112)
GFR: 64.58 mL/min (ref >60.00)
Potassium: 3.4 mEq/L — ABNORMAL LOW (ref 3.5–5.1)
Sodium: 138 mEq/L (ref 135–145)

## 2012-02-17 LAB — LIPID PANEL
LDL Cholesterol: 90 mg/dL (ref 0–99)
Total CHOL/HDL Ratio: 4

## 2012-02-22 ENCOUNTER — Encounter: Payer: Self-pay | Admitting: Internal Medicine

## 2012-02-22 ENCOUNTER — Ambulatory Visit (INDEPENDENT_AMBULATORY_CARE_PROVIDER_SITE_OTHER): Payer: Self-pay | Admitting: Internal Medicine

## 2012-02-22 VITALS — BP 120/92 | HR 81 | Temp 98.6°F | Ht 72.0 in | Wt 168.2 lb

## 2012-02-22 DIAGNOSIS — M79662 Pain in left lower leg: Secondary | ICD-10-CM

## 2012-02-22 DIAGNOSIS — M79609 Pain in unspecified limb: Secondary | ICD-10-CM

## 2012-02-22 DIAGNOSIS — E785 Hyperlipidemia, unspecified: Secondary | ICD-10-CM

## 2012-02-22 DIAGNOSIS — IMO0001 Reserved for inherently not codable concepts without codable children: Secondary | ICD-10-CM

## 2012-02-22 DIAGNOSIS — I10 Essential (primary) hypertension: Secondary | ICD-10-CM

## 2012-02-22 NOTE — Patient Instructions (Addendum)
Continue all other medications as before You will be contacted regarding the referral for: leg circulation test (Bilateral lower extremity Dopplers) Please stop smoking Please return in 6 months, or sooner if needed

## 2012-02-22 NOTE — Assessment & Plan Note (Signed)
stable overall by hx and exam, most recent data reviewed with pt, and pt to continue medical treatment as before Lab Results  Component Value Date   LDLCALC 90 02/17/2012

## 2012-02-22 NOTE — Assessment & Plan Note (Signed)
?   cluadication vs [facility] - for LE art dopplers after sept 1 due to insurance

## 2012-02-22 NOTE — Assessment & Plan Note (Signed)
stable overall by hx and exam, most recent data reviewed with pt, and pt to continue medical treatment as before BP Readings from Last 3 Encounters:  02/22/12 120/92  11/20/11 150/90  11/08/11 142/92

## 2012-02-22 NOTE — Progress Notes (Signed)
Subjective:    Patient ID: Christopher Lopez, male    DOB: [birth year], 51 y.o.   MRN: [id number]  HPI  Here to f/u; overall doing ok,  Pt denies chest pain, increased sob or doe, wheezing, orthopnea, PND, increased LE swelling, palpitations, dizziness or syncope.  Pt denies new neurological symptoms such as new headache, or facial or extremity weakness or numbness   Pt denies polydipsia, polyuria, or low sugar symptoms such as weakness or confusion improved with po intake.  Pt states overall good compliance with meds, trying to follow lower cholesterol, diabetic diet, wt overall stable but little exercise however  Here with mother. Also mentions recurring maybe reproducible pain to the left calf on walking, without back, knee or other pain/radicular.  No falls, but maybe improved with walking with the 4 prong cane he brings today, obtained by his mother.  No leg swelling, red, tender, or trauma. Does not have insurance at this time, but mother states will have medicare after sept 1, cannot afford LE dopplers un til that time Past Medical History  Diagnosis Date  . Diabetes mellitus   . Hypertension   . Pneumonia 09/18/2011  . Anemia, unspecified 09/18/2011  . HTN (hypertension) 09/18/2011  . Impaired glucose tolerance 09/18/2011  . Colitis 09/18/2011  . Dementia 09/18/2011  . Alcohol abuse 09/18/2011  . Foot ulcer 09/18/2011  . Allergic rhinitis, cause unspecified 09/20/2011  . Type II or unspecified type diabetes mellitus without mention of complication, uncontrolled 09/20/2011  . Childhood asthma 09/20/2011  . Hyperlipidemia 11/08/2011  . Pre-ulcerative corn or callous 11/20/2011   Past Surgical History  Procedure Date  . Tonsillectomy     reports that he has been smoking.  He does not have any smokeless tobacco history on file. He reports that he drinks alcohol. He reports that he does not use illicit drugs. family history is not on file. No Known Allergies Current Outpatient Prescriptions on File Prior to  Visit  Medication Sig Dispense Refill  . aspirin 81 MG EC tablet Take 1 tablet (81 mg total) by mouth daily. Swallow whole.  30 tablet  12  . diltiazem (CARDIZEM) 30 MG tablet Take 1 tablet (30 mg total) by mouth 3 (three) times daily.  270 tablet  3  . glipiZIDE (GLUCOTROL) 10 MG tablet Take 1 tablet (10 mg total) by mouth 2 (two) times daily before a meal.  180 tablet  3  . hydrochlorothiazide (HYDRODIURIL) 25 MG tablet Take 1 tablet (25 mg total) by mouth daily.  90 tablet  3  . lisinopril (PRINIVIL,ZESTRIL) 40 MG tablet Take 1 tablet (40 mg total) by mouth daily.  90 tablet  3  . lovastatin (MEVACOR) 40 MG tablet Take 1 tablet (40 mg total) by mouth daily.  90 tablet  3  . metFORMIN (GLUCOPHAGE) 1000 MG tablet Take 1 tablet (1,000 mg total) by mouth 2 (two) times daily with a meal.  180 tablet  3  . Multiple Vitamin (MULTIVITAMIN) tablet Take 1 tablet by mouth daily.      . sitaGLIPtin (JANUVIA) 100 MG tablet Take 1 tablet (100 mg total) by mouth daily.  30 tablet  11   Review of Systems Constitutional: Negative for diaphoresis and unexpected weight change.  HENT: Negative for drooling and tinnitus.   Eyes: Negative for photophobia and visual disturbance.  Respiratory: Negative for stridor.   Gastrointestinal: Negative for vomiting and blood in stool.  Genitourinary: Negative for hematuria and decreased urine volume.  Musculoskeletal: Negative for  acute joint swelling Skin: Negative for color change and wound.  Neurological: Negative for tremors and numbness.  Psychiatric/Behavioral: Negative for decreased concentration. The patient is not hyperactive.      Objective:   Physical Exam BP 120/92  Pulse 81  Temp 98.6 F (37 C) (Oral)  Ht 6' (1.829 m)  Wt 168 lb 4 oz (76.318 kg)  BMI 22.82 kg/m2  SpO2 99% Physical Exam  VS noted Constitutional: Pt appears well-developed and well-nourished.  HENT: Head: Normocephalic.  Right Ear: External ear normal.  Left Ear: External ear  normal.  Eyes: Conjunctivae and EOM are normal. Pupils are equal, round, and reactive to light.  Neck: Normal range of motion. Neck supple.  Cardiovascular: Normal rate and regular rhythm.   Pulmonary/Chest: Effort normal and breath sounds normal.  Neurological: Pt is alert. Not confused.  Skin: Skin is warm. No erythema. No rash Psychiatric: Pt behavior is normal.    Assessment & Plan:

## 2012-02-22 NOTE — Assessment & Plan Note (Signed)
stable overall by hx and exam, most recent data reviewed with pt, and pt to continue medical treatment as before Lab Results  Component Value Date   HGBA1C 6.5 02/17/2012

## 2012-03-16 ENCOUNTER — Other Ambulatory Visit: Payer: Self-pay | Admitting: *Deleted

## 2012-03-16 DIAGNOSIS — I739 Peripheral vascular disease, unspecified: Secondary | ICD-10-CM

## 2012-03-17 ENCOUNTER — Encounter (INDEPENDENT_AMBULATORY_CARE_PROVIDER_SITE_OTHER): Payer: Medicare Other

## 2012-03-17 DIAGNOSIS — I739 Peripheral vascular disease, unspecified: Secondary | ICD-10-CM

## 2012-03-22 ENCOUNTER — Encounter: Payer: Self-pay | Admitting: Internal Medicine

## 2012-05-08 ENCOUNTER — Ambulatory Visit: Payer: Self-pay | Admitting: Internal Medicine

## 2012-08-24 ENCOUNTER — Ambulatory Visit: Payer: Self-pay | Admitting: Internal Medicine

## 2012-09-05 ENCOUNTER — Other Ambulatory Visit (INDEPENDENT_AMBULATORY_CARE_PROVIDER_SITE_OTHER): Payer: Medicare Other

## 2012-09-05 ENCOUNTER — Ambulatory Visit (INDEPENDENT_AMBULATORY_CARE_PROVIDER_SITE_OTHER): Payer: Medicare Other | Admitting: Internal Medicine

## 2012-09-05 ENCOUNTER — Encounter: Payer: Self-pay | Admitting: Internal Medicine

## 2012-09-05 VITALS — BP 142/90 | HR 93 | Temp 97.9°F | Ht 69.5 in | Wt 173.1 lb

## 2012-09-05 DIAGNOSIS — I10 Essential (primary) hypertension: Secondary | ICD-10-CM

## 2012-09-05 DIAGNOSIS — Z0000 Encounter for general adult medical examination without abnormal findings: Secondary | ICD-10-CM

## 2012-09-05 DIAGNOSIS — IMO0001 Reserved for inherently not codable concepts without codable children: Secondary | ICD-10-CM

## 2012-09-05 LAB — BASIC METABOLIC PANEL
BUN: 11 mg/dL (ref 6–23)
Chloride: 101 mEq/L (ref 96–112)
Glucose, Bld: 79 mg/dL (ref 70–99)
Potassium: 4.2 mEq/L (ref 3.5–5.1)

## 2012-09-05 LAB — HEPATIC FUNCTION PANEL
ALT: 15 U/L (ref 0–53)
AST: 18 U/L (ref 0–37)
Albumin: 3.6 g/dL (ref 3.5–5.2)
Alkaline Phosphatase: 73 U/L (ref 39–117)

## 2012-09-05 LAB — URINALYSIS, ROUTINE W REFLEX MICROSCOPIC
Leukocytes, UA: NEGATIVE
Nitrite: NEGATIVE
Urobilinogen, UA: 1 (ref 0.0–1.0)
pH: 6 (ref 5.0–8.0)

## 2012-09-05 LAB — LIPID PANEL
HDL: 44.7 mg/dL (ref >39.00)
Total CHOL/HDL Ratio: 4
Triglycerides: 87 mg/dL (ref 0.0–149.0)

## 2012-09-05 LAB — CBC WITH DIFFERENTIAL/PLATELET
Basophils Absolute: 0 10*3/uL (ref 0.0–0.1)
Eosinophils Relative: 1.6 % (ref 0.0–5.0)
MCV: 94.8 fl (ref 78.0–100.0)
Monocytes Absolute: 0.5 10*3/uL (ref 0.1–1.0)
Monocytes Relative: 4.2 % (ref 3.0–12.0)
Neutrophils Relative %: 76.9 % (ref 43.0–77.0)
Platelets: 269 10*3/uL (ref 150.0–400.0)
RDW: 13.9 % (ref 11.5–14.6)
WBC: 11.6 10*3/uL — ABNORMAL HIGH (ref 4.5–10.5)

## 2012-09-05 LAB — MICROALBUMIN / CREATININE URINE RATIO: Microalb Creat Ratio: 163.4 mg/g — ABNORMAL HIGH (ref 0.0–30.0)

## 2012-09-05 LAB — HEMOGLOBIN A1C: Hgb A1c MFr Bld: 6.5 % (ref 4.6–6.5)

## 2012-09-05 LAB — TSH: TSH: 0.93 u[IU]/mL (ref 0.35–5.50)

## 2012-09-05 MED ORDER — SITAGLIPTIN PHOSPHATE 100 MG PO TABS: 100.0000 mg | ORAL_TABLET | Freq: Every day | ORAL | Status: DC

## 2012-09-05 MED ORDER — GLIPIZIDE 10 MG PO TABS: 10.0000 mg | ORAL_TABLET | Freq: Two times a day (BID) | ORAL | Status: DC

## 2012-09-05 MED ORDER — METFORMIN HCL 1000 MG PO TABS: 1000.0000 mg | ORAL_TABLET | Freq: Two times a day (BID) | ORAL | Status: DC

## 2012-09-05 MED ORDER — LOVASTATIN 40 MG PO TABS: 40.0000 mg | ORAL_TABLET | Freq: Every day | ORAL | Status: DC

## 2012-09-05 MED ORDER — HYDROCHLOROTHIAZIDE 25 MG PO TABS: 25.0000 mg | ORAL_TABLET | Freq: Every day | ORAL | Status: DC

## 2012-09-05 MED ORDER — DILTIAZEM HCL 30 MG PO TABS: 30.0000 mg | ORAL_TABLET | Freq: Three times a day (TID) | ORAL | Status: DC

## 2012-09-05 MED ORDER — LISINOPRIL 40 MG PO TABS: 40.0000 mg | ORAL_TABLET | Freq: Every day | ORAL | Status: DC

## 2012-09-05 NOTE — Assessment & Plan Note (Signed)
stable overall by history and exam, recent data reviewed with pt, and pt to continue medical treatment as before,  to f/u any worsening symptoms or concerns BP Readings from Last 3 Encounters:  09/05/12 142/90  02/22/12 120/92  11/20/11 150/90

## 2012-09-05 NOTE — Progress Notes (Signed)
Subjective:    Patient ID: Christopher Lopez, male    DOB: 09-03-1960, 52 y.o.   MRN: [id number]  HPI  Pt here with mother, has hx of dementia and hx difficult, but Here for wellness and f/u;  Overall doing ok;  Pt denies CP, worsening SOB, DOE, wheezing, orthopnea, PND, worsening LE edema, palpitations, dizziness or syncope.  Pt denies neurological change such as new headache, facial or extremity weakness.  Pt denies polydipsia, polyuria, or low sugar symptoms. Pt states overall good compliance with treatment and medications, good tolerability, and has been trying to follow lower cholesterol diet.  Pt denies worsening depressive symptoms, suicidal ideation or panic. No fever, night sweats, wt loss, loss of appetite, or other constitutional symptoms.  Pt states good ability with ADL's, has low fall risk, home safety reviewed and adequate, no other significant changes in hearing or vision, and only occasionally active with exercise.  No acute complaints Past Medical History  Diagnosis Date  . Diabetes mellitus   . Hypertension   . Pneumonia 09/18/2011  . Anemia, unspecified 09/18/2011  . HTN (hypertension) 09/18/2011  . Impaired glucose tolerance 09/18/2011  . Colitis 09/18/2011  . Dementia 09/18/2011  . Alcohol abuse 09/18/2011  . Foot ulcer 09/18/2011  . Allergic rhinitis, cause unspecified 09/20/2011  . Type II or unspecified type diabetes mellitus without mention of complication, uncontrolled 09/20/2011  . Childhood asthma 09/20/2011  . Hyperlipidemia 11/08/2011  . Pre-ulcerative corn or callous 11/20/2011   Past Surgical History  Procedure Laterality Date  . Tonsillectomy      reports that he has been smoking.  He does not have any smokeless tobacco history on file. He reports that  drinks alcohol. He reports that he does not use illicit drugs. family history is not on file. No Known Allergies Current Outpatient Prescriptions on File Prior to Visit  Medication Sig Dispense Refill  . aspirin 81 MG EC  tablet Take 1 tablet (81 mg total) by mouth daily. Swallow whole.  30 tablet  12  . Multiple Vitamin (MULTIVITAMIN) tablet Take 1 tablet by mouth daily.       No current facility-administered medications on file prior to visit.   Review of Systems Constitutional: Negative for diaphoresis, activity change, appetite change or unexpected weight change.  HENT: Negative for hearing loss, ear pain, facial swelling, mouth sores and neck stiffness.   Eyes: Negative for pain, redness and visual disturbance.  Respiratory: Negative for shortness of breath and wheezing.   Cardiovascular: Negative for chest pain and palpitations.  Gastrointestinal: Negative for diarrhea, blood in stool, abdominal distention or other pain Genitourinary: Negative for hematuria, flank pain or change in urine volume.  Musculoskeletal: Negative for myalgias and joint swelling.  Skin: Negative for color change and wound.  Neurological: Negative for syncope and numbness. other than noted Hematological: Negative for adenopathy.  Psychiatric/Behavioral: Negative for hallucinations, self-injury, decreased concentration and agitation.      Objective:   Physical Exam BP 142/90  Pulse 93  Temp(Src) 97.9 F (36.6 C) (Oral)  Ht 5' 9.5" (1.765 m)  Wt 173 lb 2 oz (78.529 kg)  BMI 25.21 kg/m2  SpO2 98% VS noted,  Constitutional: Pt is oriented to person, place, and time. Appears well-developed and well-nourished.  Head: Normocephalic and atraumatic.  Right Ear: External ear normal.  Left Ear: External ear normal.  Nose: Nose normal.  Mouth/Throat: Oropharynx is clear and moist.  Eyes: Conjunctivae and EOM are normal. Pupils are equal, round, and reactive to light.  Neck: Normal range of motion. Neck supple. No JVD present. No tracheal deviation present.  Cardiovascular: Normal rate, regular rhythm, normal heart sounds and intact distal pulses.   Pulmonary/Chest: Effort normal and breath sounds normal.  Abdominal: Soft.  Bowel sounds are normal. There is no tenderness. No HSM  Musculoskeletal: Normal range of motion. Exhibits no edema.  Lymphadenopathy:  Has no cervical adenopathy.  Neurological: Pt is alert and oriented to person, place, and time. Pt has normal reflexes. No cranial nerve deficit.  Skin: Skin is warm and dry. No rash noted.  Psychiatric:  Has  normal mood and affect. Behavior is normal.     Assessment & Plan:

## 2012-09-05 NOTE — Assessment & Plan Note (Signed)

## 2012-09-05 NOTE — Assessment & Plan Note (Signed)
stable overall by history and exam, recent data reviewed with pt, and pt to continue medical treatment as before,  to f/u any worsening symptoms or concerns Lab Results  Component Value Date   HGBA1C 6.5 09/05/2012

## 2012-09-05 NOTE — Patient Instructions (Addendum)
You had the tetanus shot today Please continue all other medications as before, and refills have been done if requested. Please have the pharmacy call with any other refills you may need. Please go to the LAB in the Basement (turn left off the elevator) for the tests to be done today You will be contacted by phone if any changes need to be made immediately.  Otherwise, you will receive a letter about your results with an explanation, but please check with MyChart first. Please continue your efforts at being more active, low cholesterol diet, and weight control. You will be contacted regarding the referral for: colonoscopy You are otherwise up to date with prevention measures today. Thank you for enrolling in [facility]. Please follow the instructions below to securely access your online medical record. MyChart allows you to send messages to your doctor, view your test results, renew your prescriptions, schedule appointments, and more. To Log into My Chart online, please go by [employer] or [employer] to [email or web address], or download the MyChart App from the [employer].  Your Username is: Media planner (password; [email or web address]) Please return in 6 months, or sooner if needed, with Lab testing done 3-5 days before

## 2013-01-04 ENCOUNTER — Telehealth: Payer: Self-pay | Admitting: *Deleted

## 2013-01-04 MED ORDER — SITAGLIPTIN PHOSPHATE 100 MG PO TABS: 100.0000 mg | ORAL_TABLET | Freq: Every day | ORAL | Status: DC

## 2013-01-04 NOTE — Telephone Encounter (Signed)
Enid Derry, pt's mother called requesting Januvia Rx be changed to 30 day supply instead of 90 day supply.  Change updated and sent to pharmacy on file.

## 2013-02-13 ENCOUNTER — Ambulatory Visit (INDEPENDENT_AMBULATORY_CARE_PROVIDER_SITE_OTHER): Payer: Medicare Other | Admitting: Internal Medicine

## 2013-02-13 ENCOUNTER — Encounter: Payer: Self-pay | Admitting: Internal Medicine

## 2013-02-13 ENCOUNTER — Other Ambulatory Visit (INDEPENDENT_AMBULATORY_CARE_PROVIDER_SITE_OTHER): Payer: Medicare Other

## 2013-02-13 VITALS — BP 132/90 | HR 106 | Temp 97.9°F | Wt 174.4 lb

## 2013-02-13 DIAGNOSIS — Z0000 Encounter for general adult medical examination without abnormal findings: Secondary | ICD-10-CM

## 2013-02-13 DIAGNOSIS — IMO0001 Reserved for inherently not codable concepts without codable children: Secondary | ICD-10-CM

## 2013-02-13 DIAGNOSIS — K5900 Constipation, unspecified: Secondary | ICD-10-CM

## 2013-02-13 DIAGNOSIS — R6881 Early satiety: Secondary | ICD-10-CM

## 2013-02-13 DIAGNOSIS — R110 Nausea: Secondary | ICD-10-CM

## 2013-02-13 LAB — BASIC METABOLIC PANEL
CO2: 28 mEq/L (ref 19–32)
Chloride: 101 mEq/L (ref 96–112)
Sodium: 138 mEq/L (ref 135–145)

## 2013-02-13 LAB — H. PYLORI ANTIBODY, IGG: H Pylori IgG: NEGATIVE

## 2013-02-13 LAB — LIPID PANEL: Total CHOL/HDL Ratio: 4

## 2013-02-13 LAB — HEPATIC FUNCTION PANEL
ALT: 16 U/L (ref 0–53)
Alkaline Phosphatase: 51 U/L (ref 39–117)
Bilirubin, Direct: 0.1 mg/dL (ref 0.0–0.3)
Total Protein: 7.9 g/dL (ref 6.0–8.3)

## 2013-02-13 NOTE — Assessment & Plan Note (Signed)
?   Peptic vs constipation vs other - for prilosec 40 qd trial x 1 wk, wife to call for rx if helps

## 2013-02-13 NOTE — Assessment & Plan Note (Signed)
For miralax dialy asd

## 2013-02-13 NOTE — Assessment & Plan Note (Signed)
stable overall by history and exam, recent data reviewed with pt, and pt to continue medical treatment as before,  to f/u any worsening symptoms or concerns Lab Results  Component Value Date   HGBA1C 6.5 09/05/2012

## 2013-02-13 NOTE — Assessment & Plan Note (Signed)
No recent wt loss or vomiting, ok to follow for now, tx o/w as above

## 2013-02-13 NOTE — Patient Instructions (Signed)
Please take all new medication as prescribed  - the prilosec otc at 40 mg per day (the 1 wk samples given today) Please call if this helps for prescription OK to take Daily Miralax as directed Please go to the LAB in the Basement (turn left off the elevator) for the tests to be done today You will be contacted by phone if any changes need to be made immediately.  Otherwise, you will receive a letter about your results with an explanation, but please check with MyChart first.  Please remember to sign up for My Chart if you have not done so, as this will be important to you in the future with finding out test results, communicating by private email, and scheduling acute appointments online when needed.  Please call for vomiting, fever, weight loss or blood, or pain as you may need to see Gastroenterology  Please return in 6 months, or sooner if needed, with Lab testing done 3-5 days before

## 2013-02-13 NOTE — Progress Notes (Signed)
  Subjective:    Patient ID: Christopher Lopez, male    DOB: 01-20-1961, 52 y.o.   MRN: [id number]  HPI   Here to f/u; overall doing ok,  Pt denies chest pain, increased sob or doe, wheezing, orthopnea, PND, increased LE swelling, palpitations, dizziness or syncope.  Pt denies polydipsia, polyuria, or low sugar symptoms such as weakness or confusion improved with po intake.  Pt denies new neurological symptoms such as new headache, or facial or extremity weakness or numbness.   Pt states overall good compliance with meds, has been trying to follow lower cholesterol, diabetic diet, with wt overall stable,  but little exercise however.  Has intermittent constipation, as well 1 wk worsening nausea without vomiting, wt loss, dysphagia, pain, fever or blood.   Past Medical History  Diagnosis Date  . Diabetes mellitus   . Hypertension   . Pneumonia 09/18/2011  . Anemia, unspecified 09/18/2011  . HTN (hypertension) 09/18/2011  . Impaired glucose tolerance 09/18/2011  . Colitis 09/18/2011  . Dementia 09/18/2011  . Alcohol abuse 09/18/2011  . Foot ulcer 09/18/2011  . Allergic rhinitis, cause unspecified 09/20/2011  . Type II or unspecified type diabetes mellitus without mention of complication, uncontrolled 09/20/2011  . Childhood asthma 09/20/2011  . Hyperlipidemia 11/08/2011  . Pre-ulcerative corn or callous 11/20/2011   Past Surgical History  Procedure Laterality Date  . Tonsillectomy      reports that he has been smoking.  He does not have any smokeless tobacco history on file. He reports that  drinks alcohol. He reports that he does not use illicit drugs. family history is not on file. No Known Allergies  Review of Systems  Constitutional: Negative for unexpected weight change, or unusual diaphoresis  HENT: Negative for tinnitus.   Eyes: Negative for photophobia and visual disturbance.  Respiratory: Negative for choking and stridor.   Gastrointestinal: Negative for vomiting and blood in stool.  Genitourinary:  Negative for hematuria and decreased urine volume.  Musculoskeletal: Negative for acute joint swelling Skin: Negative for color change and wound.  Neurological: Negative for tremors and numbness other than noted  Psychiatric/Behavioral: Negative for decreased concentration or  hyperactivity.       Objective:   Physical Exam BP 132/90  Pulse 106  Temp(Src) 97.9 F (36.6 C) (Oral)  Wt 174 lb 6 oz (79.096 kg)  BMI 25.39 kg/m2  SpO2 98% VS noted, not ill but mild uncomfortable Constitutional: Pt appears well-developed and well-nourished.  HENT: Head: NCAT.  Right Ear: External ear normal.  Left Ear: External ear normal.  Eyes: Conjunctivae and EOM are normal. Pupils are equal, round, and reactive to light.  Neck: Normal range of motion. Neck supple.  Cardiovascular: Normal rate and regular rhythm.   Pulmonary/Chest: Effort normal and breath sounds normal.  Abd:  Soft, NT, non-distended, + BS- benign exam Neurological: Pt is alert.At baseline confused  Skin: Skin is warm. No erythema.  Psychiatric: Pt behavior is normal. Not nervous or depressed affect    Assessment & Plan:

## 2013-03-07 ENCOUNTER — Ambulatory Visit: Payer: Medicare Other | Admitting: Internal Medicine

## 2013-05-17 ENCOUNTER — Other Ambulatory Visit: Payer: Self-pay

## 2013-06-11 ENCOUNTER — Ambulatory Visit: Payer: Self-pay | Admitting: Podiatry

## 2013-07-23 ENCOUNTER — Ambulatory Visit: Payer: Self-pay | Admitting: Podiatry

## 2013-08-01 ENCOUNTER — Ambulatory Visit (INDEPENDENT_AMBULATORY_CARE_PROVIDER_SITE_OTHER): Payer: Medicare Other | Admitting: Podiatrist

## 2013-08-01 ENCOUNTER — Encounter: Payer: Self-pay | Admitting: Podiatrist

## 2013-08-01 VITALS — BP 165/106 | HR 95 | Resp 18

## 2013-08-01 DIAGNOSIS — M216X9 Other acquired deformities of unspecified foot: Secondary | ICD-10-CM

## 2013-08-01 DIAGNOSIS — M79609 Pain in unspecified limb: Secondary | ICD-10-CM

## 2013-08-01 DIAGNOSIS — B351 Tinea unguium: Secondary | ICD-10-CM

## 2013-08-01 DIAGNOSIS — L84 Corns and callosities: Secondary | ICD-10-CM

## 2013-08-01 DIAGNOSIS — E1149 Type 2 diabetes mellitus with other diabetic neurological complication: Secondary | ICD-10-CM

## 2013-08-01 DIAGNOSIS — Q828 Other specified congenital malformations of skin: Secondary | ICD-10-CM

## 2013-08-01 NOTE — Patient Instructions (Signed)
Diabetes and Foot Care Diabetes may cause you to have problems because of poor blood supply (circulation) to your feet and legs. This may cause the skin on your feet to become thinner, break easier, and heal more slowly. Your skin may become dry, and the skin may peel and crack. You may also have nerve damage in your legs and feet causing decreased feeling in them. You may not notice minor injuries to your feet that could lead to infections or more serious problems. Taking care of your feet is one of the most important things you can do for yourself.  HOME CARE INSTRUCTIONS  Wear shoes at all times, even in the house. Do not go barefoot. Bare feet are easily injured.  Check your feet daily for blisters, cuts, and redness. If you cannot see the bottom of your feet, use a mirror or ask someone for help.  Wash your feet with warm water (do not use hot water) and mild soap. Then pat your feet and the areas between your toes until they are completely dry. Do not soak your feet as this can dry your skin.  Apply a moisturizing lotion or petroleum jelly (that does not contain alcohol and is unscented) to the skin on your feet and to dry, brittle toenails. Do not apply lotion between your toes.  Trim your toenails straight across. Do not dig under them or around the cuticle. File the edges of your nails with an emery board or nail file.  Do not cut corns or calluses or try to remove them with medicine.  Wear clean socks or stockings every day. Make sure they are not too tight. Do not wear knee-high stockings since they may decrease blood flow to your legs.  Wear shoes that fit properly and have enough cushioning. To break in new shoes, wear them for just a few hours a day. This prevents you from injuring your feet. Always look in your shoes before you put them on to be sure there are no objects inside.  Do not cross your legs. This may decrease the blood flow to your feet.  If you find a minor scrape,  cut, or break in the skin on your feet, keep it and the skin around it clean and dry. These areas may be cleansed with mild soap and water. Do not cleanse the area with peroxide, alcohol, or iodine.  When you remove an adhesive bandage, be sure not to damage the skin around it.  If you have a wound, look at it several times a day to make sure it is healing.  Do not use heating pads or hot water bottles. They may burn your skin. If you have lost feeling in your feet or legs, you may not know it is happening until it is too late.  Make sure your health care provider performs a complete foot exam at least annually or more often if you have foot problems. Report any cuts, sores, or bruises to your health care provider immediately. SEEK MEDICAL CARE IF:   You have an injury that is not healing.  You have cuts or breaks in the skin.  You have an ingrown nail.  You notice redness on your legs or feet.  You feel burning or tingling in your legs or feet.  You have pain or cramps in your legs and feet.  Your legs or feet are numb.  Your feet always feel cold. SEEK IMMEDIATE MEDICAL CARE IF:   There is increasing redness,   swelling, or pain in or around a wound.  There is a red line that goes up your leg.  Pus is coming from a wound.  You develop a fever or as directed by your health care provider.  You notice a bad smell coming from an ulcer or wound. Document Released: 06/25/2000 Document Revised: 02/28/2013 Document Reviewed: 12/05/2012 Exit[employer] Patient Information 2014 [employer].  

## 2013-08-01 NOTE — Progress Notes (Signed)
  HPI: Patient presents today for follow up of diabetic foot and nail care. No new complaints today  Objective:  Neurovascular status unchanged with palpable pedal pulses and neurological sensation decreased to bilateral feet.  Severely Thick  nails  Which are elongated, thickened, discolored, dystrophic with ingrown deformity present x 10.  + hyperkeratotic and pre-ulcerative lesions present submetatarsal 4 bilateral.  Significant scaley xerosis is noted to bilateral feet.  + interdigital maceration present   Assessment: Diabetes with Neuropathy , Ingrown nail deformity, hyperkeratotic lesion x 2  Plan: Discussed treatment options and alternatives. Debrided nails as best as I could considering the severity and thickness.  Debrided hyperkeratotic lesions without complication.  Return appointment recommended at routine intervals of 3 months.or per patients request   Trudie Buckler, DPM

## 2013-08-02 ENCOUNTER — Ambulatory Visit: Payer: Self-pay | Admitting: Podiatry

## 2013-08-16 ENCOUNTER — Other Ambulatory Visit (INDEPENDENT_AMBULATORY_CARE_PROVIDER_SITE_OTHER): Payer: Medicare Other

## 2013-08-16 ENCOUNTER — Encounter: Payer: Self-pay | Admitting: Internal Medicine

## 2013-08-16 ENCOUNTER — Ambulatory Visit (INDEPENDENT_AMBULATORY_CARE_PROVIDER_SITE_OTHER): Payer: Medicare Other | Admitting: Internal Medicine

## 2013-08-16 VITALS — BP 160/98 | HR 102 | Temp 98.5°F | Wt 181.2 lb

## 2013-08-16 DIAGNOSIS — E1165 Type 2 diabetes mellitus with hyperglycemia: Secondary | ICD-10-CM

## 2013-08-16 DIAGNOSIS — IMO0001 Reserved for inherently not codable concepts without codable children: Secondary | ICD-10-CM

## 2013-08-16 DIAGNOSIS — Z0000 Encounter for general adult medical examination without abnormal findings: Secondary | ICD-10-CM

## 2013-08-16 LAB — CBC WITH DIFFERENTIAL/PLATELET
Basophils Absolute: 0 10*3/uL (ref 0.0–0.1)
Basophils Relative: 0.3 % (ref 0.0–3.0)
Eosinophils Absolute: 0.2 10*3/uL (ref 0.0–0.7)
Eosinophils Relative: 2 % (ref 0.0–5.0)
HEMATOCRIT: 44.2 % (ref 39.0–52.0)
Hemoglobin: 14.3 g/dL (ref 13.0–17.0)
LYMPHS ABS: 3.3 10*3/uL (ref 0.7–4.0)
Lymphocytes Relative: 25.8 % (ref 12.0–46.0)
MCHC: 32.5 g/dL (ref 30.0–36.0)
MCV: 97.6 fl (ref 78.0–100.0)
MONO ABS: 0.5 10*3/uL (ref 0.1–1.0)
Monocytes Relative: 4.3 % (ref 3.0–12.0)
NEUTROS PCT: 67.6 % (ref 43.0–77.0)
Neutro Abs: 8.6 10*3/uL — ABNORMAL HIGH (ref 1.4–7.7)
Platelets: 230 10*3/uL (ref 150.0–400.0)
RBC: 4.53 Mil/uL (ref 4.22–5.81)
RDW: 13.7 % (ref 11.5–14.6)
WBC: 12.6 10*3/uL — ABNORMAL HIGH (ref 4.5–10.5)

## 2013-08-16 LAB — TSH: TSH: 1.06 u[IU]/mL (ref 0.35–5.50)

## 2013-08-16 LAB — PSA: PSA: 0.49 ng/mL (ref 0.10–4.00)

## 2013-08-16 MED ORDER — SITAGLIPTIN PHOSPHATE 100 MG PO TABS: 100.0000 mg | ORAL_TABLET | Freq: Every day | ORAL | Status: DC

## 2013-08-16 NOTE — Progress Notes (Signed)
Subjective:    Patient ID: Christopher Lopez, male    DOB: 01-20-1961, 53 y.o.   MRN: [id number]  HPI  Here for wellness and f/u;  Overall doing ok;  Pt denies CP, worsening SOB, DOE, wheezing, orthopnea, PND, worsening LE edema, palpitations, dizziness or syncope.  Pt denies neurological change such as new headache, facial or extremity weakness.  Pt denies polydipsia, polyuria, or low sugar symptoms. Pt states overall good compliance with treatment and medications, good tolerability, and has been trying to follow lower cholesterol diet.  Pt denies worsening depressive symptoms, suicidal ideation or panic. No fever, night sweats, wt loss, loss of appetite, or other constitutional symptoms.  Pt states good ability with ADL's, has low fall risk, home safety reviewed and adequate, no other significant changes in hearing or vision, and only occasionally active with exercise. Wlalks with cane for balance. No acute complaints Past Medical History  Diagnosis Date  . Diabetes mellitus   . Hypertension   . Pneumonia 09/18/2011  . Anemia, unspecified 09/18/2011  . HTN (hypertension) 09/18/2011  . Impaired glucose tolerance 09/18/2011  . Colitis 09/18/2011  . Dementia 09/18/2011  . Alcohol abuse 09/18/2011  . Foot ulcer 09/18/2011  . Allergic rhinitis, cause unspecified 09/20/2011  . Type II or unspecified type diabetes mellitus without mention of complication, uncontrolled 09/20/2011  . Childhood asthma 09/20/2011  . Hyperlipidemia 11/08/2011  . Pre-ulcerative corn or callous 11/20/2011   Past Surgical History  Procedure Laterality Date  . Tonsillectomy      reports that he has been smoking.  He has never used smokeless tobacco. He reports that he drinks alcohol. He reports that he does not use illicit drugs. family history is not on file. No Known Allergies Current Outpatient Prescriptions on File Prior to Visit  Medication Sig Dispense Refill  . diltiazem (CARDIZEM) 30 MG tablet Take 1 tablet (30 mg total) by  mouth 3 (three) times daily.  270 tablet  3  . glipiZIDE (GLUCOTROL) 10 MG tablet Take 1 tablet (10 mg total) by mouth 2 (two) times daily before a meal.  180 tablet  3  . hydrochlorothiazide (HYDRODIURIL) 25 MG tablet Take 1 tablet (25 mg total) by mouth daily.  90 tablet  3  . lisinopril (PRINIVIL,ZESTRIL) 40 MG tablet Take 1 tablet (40 mg total) by mouth daily.  90 tablet  3  . lovastatin (MEVACOR) 40 MG tablet Take 1 tablet (40 mg total) by mouth daily.  90 tablet  3  . metFORMIN (GLUCOPHAGE) 1000 MG tablet Take 1 tablet (1,000 mg total) by mouth 2 (two) times daily with a meal.  180 tablet  3  . Multiple Vitamin (MULTIVITAMIN) tablet Take 1 tablet by mouth daily.       No current facility-administered medications on file prior to visit.   Out of januvia for one month due to cost - over $300 Review of Systems - hx limited due to dementia Constitutional: Negative for diaphoresis, activity change, appetite change or unexpected weight change.  HENT: Negative for hearing loss, ear pain, facial swelling, mouth sores and neck stiffness.   Eyes: Negative for pain, redness and visual disturbance.  Respiratory: Negative for shortness of breath and wheezing.   Cardiovascular: Negative for chest pain and palpitations.  Gastrointestinal: Negative for diarrhea, blood in stool, abdominal distention or other pain Genitourinary: Negative for hematuria, flank pain or change in urine volume.  Musculoskeletal: Negative for myalgias and joint swelling.  Skin: Negative for color change and wound.  Neurological: Negative for syncope and numbness. other than noted Hematological: Negative for adenopathy.  Psychiatric/Behavioral: Negative for hallucinations, self-injury, decreased concentration and agitation.      Objective:   Physical Exam BP 160/98  Pulse 102  Temp(Src) 98.5 F (36.9 C) (Oral)  Wt 181 lb 4 oz (82.214 kg)  SpO2 98% VS noted,  Constitutional: Pt is oriented to person, place, and time.  Appears well-developed and well-nourished.  Head: Normocephalic and atraumatic.  Right Ear: External ear normal.  Left Ear: External ear normal.  Nose: Nose normal.  Mouth/Throat: Oropharynx is clear and moist.  Eyes: Conjunctivae and EOM are normal. Pupils are equal, round, and reactive to light.  Neck: Normal range of motion. Neck supple. No JVD present. No tracheal deviation present.  Cardiovascular: Normal rate, regular rhythm, normal heart sounds and intact distal pulses.   Pulmonary/Chest: Effort normal and breath sounds normal.  Abdominal: Soft. Bowel sounds are normal. There is no tenderness. No HSM  Musculoskeletal: Normal range of motion. Exhibits no edema.  Lymphadenopathy:  Has no cervical adenopathy.  Neurological: Pt is alert and oriented to person, place, and time. Pt has normal reflexes. No cranial nerve deficit.  Skin: Skin is warm and dry. No rash noted.  Psychiatric:  Has  normal mood and affect. Behavior is normal.     Assessment & Plan:

## 2013-08-16 NOTE — Progress Notes (Signed)
Pre-visit discussion using our clinic review tool. No additional management support is needed unless otherwise documented below in the visit note.  

## 2013-08-16 NOTE — Patient Instructions (Addendum)
Please continue all other medications as before, and refills have been done if requested. Please have the pharmacy call with any other refills you may need. We will try to fill out the [facility] Patient Assistance form so that hopefully you can get the [place] for free If this does not work out, please let me know at [phone] as we will need to try a different medication Please go to the LAB in the Basement (turn left off the elevator) for the tests to be done today You will be contacted by phone if any changes need to be made immediately.  Otherwise, you will receive a letter about your results with an explanation, but please check with MyChart first.  You will be contacted regarding the referral for: colonoscopy  Please remember to sign up for MyChart if you have not done so, as this will be important to you in the future with finding out test results, communicating by private email, and scheduling acute appointments online when needed.  Please return in 6 months, or sooner if needed

## 2013-08-16 NOTE — Assessment & Plan Note (Signed)

## 2013-08-17 LAB — URINALYSIS, ROUTINE W REFLEX MICROSCOPIC
BILIRUBIN URINE: NEGATIVE
Hgb urine dipstick: NEGATIVE
LEUKOCYTES UA: NEGATIVE
Nitrite: NEGATIVE
Specific Gravity, Urine: >=1.03 — AB (ref 1.000–1.030)
Total Protein, Urine: >=300 — AB
UROBILINOGEN UA: 0.2 (ref 0.0–1.0)
Urine Glucose: >=1000 — AB
pH: 5.5 (ref 5.0–8.0)

## 2013-09-03 ENCOUNTER — Encounter: Payer: Self-pay | Admitting: Gastroenterology

## 2013-09-12 ENCOUNTER — Other Ambulatory Visit: Payer: Self-pay | Admitting: Internal Medicine

## 2013-09-26 ENCOUNTER — Ambulatory Visit (AMBULATORY_SURGERY_CENTER): Payer: Self-pay | Admitting: *Deleted

## 2013-09-26 VITALS — Ht 72.0 in | Wt 181.8 lb

## 2013-09-26 DIAGNOSIS — Z1211 Encounter for screening for malignant neoplasm of colon: Secondary | ICD-10-CM

## 2013-09-26 MED ORDER — NA SULFATE-K SULFATE-MG SULF 17.5-3.13-1.6 GM/177ML PO SOLN: 1.0000 | Freq: Once | ORAL | Status: DC

## 2013-09-26 NOTE — Progress Notes (Signed)
No allergies to eggs or soy. No problems with anesthesia.  Pt has dementia; mother with pt during PV.  Pt verbalizes understanding of why he is here and is able to tell me that he is having a colonoscopy

## 2013-10-05 ENCOUNTER — Encounter: Payer: Self-pay | Admitting: Gastroenterology

## 2013-10-11 ENCOUNTER — Ambulatory Visit (AMBULATORY_SURGERY_CENTER): Payer: Medicare Other | Admitting: Gastroenterology

## 2013-10-11 ENCOUNTER — Encounter: Payer: Self-pay | Admitting: Gastroenterology

## 2013-10-11 VITALS — BP 155/96 | HR 77 | Temp 99.3°F | Resp 12 | Ht 72.0 in | Wt 181.0 lb

## 2013-10-11 DIAGNOSIS — K5730 Diverticulosis of large intestine without perforation or abscess without bleeding: Secondary | ICD-10-CM

## 2013-10-11 DIAGNOSIS — Z1211 Encounter for screening for malignant neoplasm of colon: Secondary | ICD-10-CM

## 2013-10-11 LAB — GLUCOSE, CAPILLARY
Glucose-Capillary: 193 mg/dL — ABNORMAL HIGH (ref 70–99)
Glucose-Capillary: 217 mg/dL — ABNORMAL HIGH (ref 70–99)

## 2013-10-11 MED ORDER — SODIUM CHLORIDE 0.9 % IV SOLN: 500.0000 mL | INTRAVENOUS | Status: DC

## 2013-10-11 NOTE — Patient Instructions (Signed)
Discharge instructions given with verbal understanding. Handouts on diverticulosis and a high fiber diet. \resume previous medications. YOU HAD AN ENDOSCOPIC PROCEDURE TODAY AT [facility]: Refer to the procedure report that was given to you for any specific questions about what was found during the examination.  If the procedure report does not answer your questions, please call your gastroenterologist to clarify.  If you requested that your care partner not be given the details of your procedure findings, then the procedure report has been included in a sealed envelope for you to review at your convenience later.  YOU SHOULD EXPECT: Some feelings of bloating in the abdomen. Passage of more gas than usual.  Walking can help get rid of the air that was put into your GI tract during the procedure and reduce the bloating. If you had a lower endoscopy (such as a colonoscopy or flexible sigmoidoscopy) you may notice spotting of blood in your stool or on the toilet paper. If you underwent a bowel prep for your procedure, then you may not have a normal bowel movement for a few days.  DIET: Your first meal following the procedure should be a light meal and then it is ok to progress to your normal diet.  A half-sandwich or bowl of soup is an example of a good first meal.  Heavy or fried foods are harder to digest and may make you feel nauseous or bloated.  Likewise meals heavy in dairy and vegetables can cause extra gas to form and this can also increase the bloating.  Drink plenty of fluids but you should avoid alcoholic beverages for 24 hours.  ACTIVITY: Your care partner should take you home directly after the procedure.  You should plan to take it easy, moving slowly for the rest of the day.  You can resume normal activity the day after the procedure however you should NOT DRIVE or use heavy machinery for 24 hours (because of the sedation medicines used during the test).    SYMPTOMS TO REPORT  IMMEDIATELY: A gastroenterologist can be reached at any hour.  During normal business hours, 8:30 AM to 5:00 PM Monday through Friday, call [phone].  After hours and on weekends, please call the GI answering service at [phone] who will take a message and have the physician on call contact you.   Following lower endoscopy (colonoscopy or flexible sigmoidoscopy):  Excessive amounts of blood in the stool  Significant tenderness or worsening of abdominal pains  Swelling of the abdomen that is new, acute  Fever of 100F or higher FOLLOW UP: If any biopsies were taken you will be contacted by phone or by letter within the next 1-3 weeks.  Call your gastroenterologist if you have not heard about the biopsies in 3 weeks.  Our staff will call the home number listed on your records the next business day following your procedure to check on you and address any questions or concerns that you may have at that time regarding the information given to you following your procedure. This is a courtesy call and so if there is no answer at the home number and we have not heard from you through the emergency physician on call, we will assume that you have returned to your regular daily activities without incident.  SIGNATURES/CONFIDENTIALITY: You and/or your care partner have signed paperwork which will be entered into your electronic medical record.  These signatures attest to the fact that that the information above on your After  Visit Summary has been reviewed and is understood.  Full responsibility of the confidentiality of this discharge information lies with you and/or your care-partner. 

## 2013-10-11 NOTE — Op Note (Signed)
[place]  [address] [place], [id number]   COLONOSCOPY PROCEDURE REPORT  PATIENT: Christopher Lopez, Christopher Lopez  MR#: [id number] BIRTHDATE: 12-Jan-1961 , 40  yrs. old GENDER: Male ENDOSCOPIST: Inda Castle, MD REFERRED [id number] Braidon, M.D. PROCEDURE DATE:  10/11/2013 PROCEDURE:   Colonoscopy, diagnostic First Screening Colonoscopy - Avg.  risk and is 50 yrs.  old or older Yes.  Prior Negative Screening - Now for repeat screening. N/A  History of Adenoma - Now for follow-up colonoscopy & has been > or = to 3 yrs.  N/A  Polyps Removed Today? No.  Recommend repeat exam, <10 yrs? No. ASA CLASS:   Class II INDICATIONS:average risk screening. MEDICATIONS: MAC sedation, administered by CRNA and Propofol (Diprivan) 230 mg IV , labetolol 10mg IV  DESCRIPTION OF PROCEDURE:   After the risks benefits and alternatives of the procedure were thoroughly explained, informed consent was obtained.  A digital rectal exam revealed no abnormalities of the rectum.   The LB [id number]  endoscope was introduced through the anus and advanced to the cecum, which was identified by both the appendix and ileocecal valve. No adverse events experienced.   The quality of the prep was excellent using Suprep  The instrument was then slowly withdrawn as the colon was fully examined.      COLON FINDINGS: Moderate diverticulosis was noted in the ascending colon and transverse colon.   The colon was otherwise normal. There was no diverticulosis, inflammation, polyps or cancers unless previously stated.  Retroflexed views revealed no abnormalities. The time to cecum=5 minutes 49 seconds.  Withdrawal time=8 minutes 44 seconds.  The scope was withdrawn and the procedure completed. COMPLICATIONS: There were no complications.  ENDOSCOPIC IMPRESSION: 1.   Moderate diverticulosis was noted in the ascending colon and transverse colon 2.   The colon was otherwise normal  RECOMMENDATIONS: Continue  current colorectal screening recommendations for "routine risk" patients with a repeat colonoscopy in 10 years. Followup with primary care physician for hypertension management   eSigned:  Inda Castle, MD 10/11/2013 11:45 AM   cc:   PATIENT NAME:  Christopher Lopez, Christopher Lopez MR#: [id number]

## 2013-10-11 NOTE — Progress Notes (Signed)
Report to RN, in pacu, vss, BBS=clear

## 2013-10-11 NOTE — Progress Notes (Signed)
I spoke to the CRNA and Dr. Deatra Ina regarding the patient's BP  160/115L  R 160/118R.  They both stated that he is okay to go through with the procedure.

## 2013-10-11 NOTE — Progress Notes (Signed)
1120 Discussed HTN with Dr. Deatra Ina, patient anxious, but with dementia, given Labetalol, bp responding well, along with anesthetic controlled well for procedure. Will monitor closely postop and Dr.kaplan will refer back to PCP.

## 2013-10-14 ENCOUNTER — Other Ambulatory Visit: Payer: Self-pay | Admitting: Internal Medicine

## 2013-10-15 ENCOUNTER — Telehealth: Payer: Self-pay | Admitting: *Deleted

## 2013-10-15 NOTE — Telephone Encounter (Signed)
  Follow up Call-  Call back number 10/11/2013  Post procedure Call Back phone  # [phone]  Permission to leave phone message Yes     Patient questions:  Do you have a fever, pain , or abdominal swelling? no Pain Score  0 *  Have you tolerated food without any problems? yes  Have you been able to return to your normal activities? yes  Do you have any questions about your discharge instructions: Diet   no Medications  no Follow up visit  no  Do you have questions or concerns about your Care? no  Actions: * If pain score is 4 or above: No action needed, pain <4.

## 2013-10-19 ENCOUNTER — Ambulatory Visit: Payer: Medicare Other | Admitting: Podiatry

## 2013-10-22 ENCOUNTER — Encounter: Payer: Self-pay | Admitting: Internal Medicine

## 2013-10-30 ENCOUNTER — Ambulatory Visit: Payer: Medicare Other | Admitting: Podiatry

## 2013-11-12 ENCOUNTER — Other Ambulatory Visit: Payer: Self-pay | Admitting: Internal Medicine

## 2013-11-20 ENCOUNTER — Encounter: Payer: Self-pay | Admitting: Podiatry

## 2013-11-20 ENCOUNTER — Ambulatory Visit (INDEPENDENT_AMBULATORY_CARE_PROVIDER_SITE_OTHER): Payer: Medicare Other | Admitting: Podiatry

## 2013-11-20 VITALS — BP 168/100 | HR 108 | Resp 16

## 2013-11-20 DIAGNOSIS — B351 Tinea unguium: Secondary | ICD-10-CM

## 2013-11-20 DIAGNOSIS — E1149 Type 2 diabetes mellitus with other diabetic neurological complication: Secondary | ICD-10-CM

## 2013-11-20 DIAGNOSIS — L84 Corns and callosities: Secondary | ICD-10-CM

## 2013-11-20 DIAGNOSIS — M79609 Pain in unspecified limb: Secondary | ICD-10-CM

## 2013-11-20 NOTE — Progress Notes (Signed)
To chief complaint of painful elongated toenails one through 5 bilateral.  Objective: Nails are thick yellow dystrophic with mycotic and painful palpation. Pulses are palpable bilateral.  Assessment: Pain in limb secondary to onychomycosis 1 through 5 bilateral.  Plan: Debridement nails 1 through 5 bilateral covered service secondary to pain.

## 2013-11-20 NOTE — Patient Instructions (Signed)
Diabetes and Foot Care Diabetes may cause you to have problems because of poor blood supply (circulation) to your feet and legs. This may cause the skin on your feet to become thinner, break easier, and heal more slowly. Your skin may become dry, and the skin may peel and crack. You may also have nerve damage in your legs and feet causing decreased feeling in them. You may not notice minor injuries to your feet that could lead to infections or more serious problems. Taking care of your feet is one of the most important things you can do for yourself.  HOME CARE INSTRUCTIONS  Wear shoes at all times, even in the house. Do not go barefoot. Bare feet are easily injured.  Check your feet daily for blisters, cuts, and redness. If you cannot see the bottom of your feet, use a mirror or ask someone for help.  Wash your feet with warm water (do not use hot water) and mild soap. Then pat your feet and the areas between your toes until they are completely dry. Do not soak your feet as this can dry your skin.  Apply a moisturizing lotion or petroleum jelly (that does not contain alcohol and is unscented) to the skin on your feet and to dry, brittle toenails. Do not apply lotion between your toes.  Trim your toenails straight across. Do not dig under them or around the cuticle. File the edges of your nails with an emery board or nail file.  Do not cut corns or calluses or try to remove them with medicine.  Wear clean socks or stockings every day. Make sure they are not too tight. Do not wear knee-high stockings since they may decrease blood flow to your legs.  Wear shoes that fit properly and have enough cushioning. To break in new shoes, wear them for just a few hours a day. This prevents you from injuring your feet. Always look in your shoes before you put them on to be sure there are no objects inside.  Do not cross your legs. This may decrease the blood flow to your feet.  If you find a minor scrape,  cut, or break in the skin on your feet, keep it and the skin around it clean and dry. These areas may be cleansed with mild soap and water. Do not cleanse the area with peroxide, alcohol, or iodine.  When you remove an adhesive bandage, be sure not to damage the skin around it.  If you have a wound, look at it several times a day to make sure it is healing.  Do not use heating pads or hot water bottles. They may burn your skin. If you have lost feeling in your feet or legs, you may not know it is happening until it is too late.  Make sure your health care provider performs a complete foot exam at least annually or more often if you have foot problems. Report any cuts, sores, or bruises to your health care provider immediately. SEEK MEDICAL CARE IF:   You have an injury that is not healing.  You have cuts or breaks in the skin.  You have an ingrown nail.  You notice redness on your legs or feet.  You feel burning or tingling in your legs or feet.  You have pain or cramps in your legs and feet.  Your legs or feet are numb.  Your feet always feel cold. SEEK IMMEDIATE MEDICAL CARE IF:   There is increasing redness,   swelling, or pain in or around a wound.  There is a red line that goes up your leg.  Pus is coming from a wound.  You develop a fever or as directed by your health care provider.  You notice a bad smell coming from an ulcer or wound. Document Released: 06/25/2000 Document Revised: 02/28/2013 Document Reviewed: 12/05/2012 Exit[employer] Patient Information 2014 [employer].  

## 2013-12-02 ENCOUNTER — Other Ambulatory Visit: Payer: Self-pay | Admitting: Internal Medicine

## 2013-12-25 ENCOUNTER — Other Ambulatory Visit: Payer: Self-pay | Admitting: Internal Medicine

## 2014-01-14 ENCOUNTER — Telehealth: Payer: Self-pay | Admitting: *Deleted

## 2014-01-14 DIAGNOSIS — IMO0001 Reserved for inherently not codable concepts without codable children: Secondary | ICD-10-CM

## 2014-01-14 DIAGNOSIS — E1165 Type 2 diabetes mellitus with hyperglycemia: Principal | ICD-10-CM

## 2014-01-14 NOTE — Telephone Encounter (Signed)
Patient has an upcoming appointment. Lipid, a1c, bmet ordered. Diabetic bundle

## 2014-01-16 ENCOUNTER — Other Ambulatory Visit: Payer: Self-pay | Admitting: Internal Medicine

## 2014-01-29 ENCOUNTER — Ambulatory Visit: Payer: Medicare Other | Admitting: Podiatry

## 2014-02-14 ENCOUNTER — Encounter: Payer: Self-pay | Admitting: Internal Medicine

## 2014-02-14 ENCOUNTER — Ambulatory Visit (INDEPENDENT_AMBULATORY_CARE_PROVIDER_SITE_OTHER): Payer: Medicare Other | Admitting: Internal Medicine

## 2014-02-14 ENCOUNTER — Other Ambulatory Visit (INDEPENDENT_AMBULATORY_CARE_PROVIDER_SITE_OTHER): Payer: Medicare Other

## 2014-02-14 VITALS — BP 132/86 | HR 99 | Temp 98.6°F | Wt 185.5 lb

## 2014-02-14 DIAGNOSIS — E1165 Type 2 diabetes mellitus with hyperglycemia: Principal | ICD-10-CM

## 2014-02-14 DIAGNOSIS — F0390 Unspecified dementia without behavioral disturbance: Secondary | ICD-10-CM

## 2014-02-14 DIAGNOSIS — I10 Essential (primary) hypertension: Secondary | ICD-10-CM

## 2014-02-14 DIAGNOSIS — M25562 Pain in left knee: Secondary | ICD-10-CM

## 2014-02-14 DIAGNOSIS — M25569 Pain in unspecified knee: Secondary | ICD-10-CM

## 2014-02-14 DIAGNOSIS — Z0000 Encounter for general adult medical examination without abnormal findings: Secondary | ICD-10-CM

## 2014-02-14 DIAGNOSIS — E785 Hyperlipidemia, unspecified: Secondary | ICD-10-CM

## 2014-02-14 DIAGNOSIS — IMO0001 Reserved for inherently not codable concepts without codable children: Secondary | ICD-10-CM

## 2014-02-14 DIAGNOSIS — Z125 Encounter for screening for malignant neoplasm of prostate: Secondary | ICD-10-CM

## 2014-02-14 LAB — LIPID PANEL
CHOLESTEROL: 180 mg/dL (ref 0–200)
HDL: 33.8 mg/dL — AB (ref >39.00)
LDL Cholesterol: 116 mg/dL — ABNORMAL HIGH (ref 0–99)
NonHDL: 146.2
Total CHOL/HDL Ratio: 5
Triglycerides: 151 mg/dL — ABNORMAL HIGH (ref 0.0–149.0)
VLDL: 30.2 mg/dL (ref 0.0–40.0)

## 2014-02-14 LAB — CBC WITH DIFFERENTIAL/PLATELET
Basophils Absolute: 0.1 10*3/uL (ref 0.0–0.1)
Basophils Relative: 0.5 % (ref 0.0–3.0)
EOS PCT: 1.9 % (ref 0.0–5.0)
Eosinophils Absolute: 0.2 10*3/uL (ref 0.0–0.7)
HCT: 42.4 % (ref 39.0–52.0)
Hemoglobin: 14.5 g/dL (ref 13.0–17.0)
LYMPHS PCT: 19.6 % (ref 12.0–46.0)
Lymphs Abs: 2.5 10*3/uL (ref 0.7–4.0)
MCHC: 34.3 g/dL (ref 30.0–36.0)
MCV: 93.3 fl (ref 78.0–100.0)
MONO ABS: 0.5 10*3/uL (ref 0.1–1.0)
Monocytes Relative: 3.7 % (ref 3.0–12.0)
NEUTROS PCT: 74.3 % (ref 43.0–77.0)
Neutro Abs: 9.3 10*3/uL — ABNORMAL HIGH (ref 1.4–7.7)
PLATELETS: 262 10*3/uL (ref 150.0–400.0)
RBC: 4.55 Mil/uL (ref 4.22–5.81)
RDW: 13.7 % (ref 11.5–15.5)
WBC: 12.5 10*3/uL — ABNORMAL HIGH (ref 4.0–10.5)

## 2014-02-14 LAB — HEPATIC FUNCTION PANEL
ALT: 14 U/L (ref 0–53)
AST: 20 U/L (ref 0–37)
Albumin: 4.2 g/dL (ref 3.5–5.2)
Alkaline Phosphatase: 62 U/L (ref 39–117)
Bilirubin, Direct: 0.1 mg/dL (ref 0.0–0.3)
TOTAL PROTEIN: 8.2 g/dL (ref 6.0–8.3)
Total Bilirubin: 0.8 mg/dL (ref 0.2–1.2)

## 2014-02-14 LAB — BASIC METABOLIC PANEL
BUN: 18 mg/dL (ref 6–23)
CALCIUM: 9.7 mg/dL (ref 8.4–10.5)
CO2: 27 meq/L (ref 19–32)
CREATININE: 2 mg/dL — AB (ref 0.4–1.5)
Chloride: 98 mEq/L (ref 96–112)
GFR: 44.67 mL/min — ABNORMAL LOW (ref >60.00)
GLUCOSE: 160 mg/dL — AB (ref 70–99)
Potassium: 3.8 mEq/L (ref 3.5–5.1)
Sodium: 134 mEq/L — ABNORMAL LOW (ref 135–145)

## 2014-02-14 LAB — VITAMIN B12: Vitamin B-12: 387 pg/mL (ref 211–911)

## 2014-02-14 LAB — TSH: TSH: 1.01 u[IU]/mL (ref 0.35–4.50)

## 2014-02-14 LAB — PSA: PSA: 0.5 ng/mL (ref 0.10–4.00)

## 2014-02-14 LAB — HEMOGLOBIN A1C: Hgb A1c MFr Bld: 8.4 % — ABNORMAL HIGH (ref 4.6–6.5)

## 2014-02-14 NOTE — Progress Notes (Signed)
Pre visit review using our clinic review tool, if applicable. No additional management support is needed unless otherwise documented below in the visit note. 

## 2014-02-14 NOTE — Patient Instructions (Signed)
Please continue all other medications as before, and refills have been done if requested.  Please have the pharmacy call with any other refills you may need.  Please continue your efforts at being more active, low cholesterol diet, and weight control.  You are otherwise up to date with prevention measures today.  Please keep your appointments with your specialists as you may have planned  .You will be contacted regarding the referral for: Dr Tamala Julian, Sports medicine  Please go to the LAB in the Basement (turn left off the elevator) for the tests to be done today  You will be contacted by phone if any changes need to be made immediately.  Otherwise, you will receive a letter about your results with an explanation, but please check with MyChart first.  Please remember to sign up for MyChart if you have not done so, as this will be important to you in the future with finding out test results, communicating by private email, and scheduling acute appointments online when needed.  Please return in 6 months, or sooner if needed, with Lab testing done 3-5 days before

## 2014-02-14 NOTE — Assessment & Plan Note (Signed)
Overall stable, for B12 f/u

## 2014-02-14 NOTE — Progress Notes (Signed)
Subjective:    Patient ID: Dagoberto Reef, male    DOB: [birth year], 53 y.o.   MRN: [id number]  HPI  Here to f/u; overall doing ok,  Pt denies chest pain, increased sob or doe, wheezing, orthopnea, PND, increased LE swelling, palpitations, dizziness or syncope.  Pt denies polydipsia, polyuria, or low sugar symptoms such as weakness or confusion improved with po intake.  Pt denies new neurological symptoms such as new headache, or facial or extremity weakness or numbness.   Pt states overall good compliance with meds, has been trying to follow lower cholesterol, diabetic diet, with wt overall stable,  but little exercise however.  Wife mentions memory ability seems to be up and down, wax and wane, but Dementia overall stable symptomatically,, and not assoc with behavioral changes such as hallucinations, paranoia, or agitation.   No recent b12 check. Also with ongoing ? Worse left knee pain, has been walking with cane for several yrs, no prior evaluation, but no prior evaluation or tx, makes him off balance at times to ambulate, and higher risk of fall with standing. Past Medical History  Diagnosis Date  . Diabetes mellitus   . Hypertension   . Pneumonia 09/18/2011  . Anemia, unspecified 09/18/2011  . HTN (hypertension) 09/18/2011  . Impaired glucose tolerance 09/18/2011  . Colitis 09/18/2011  . Dementia 09/18/2011  . Alcohol abuse 09/18/2011  . Foot ulcer 09/18/2011  . Allergic rhinitis, cause unspecified 09/20/2011  . Type II or unspecified type diabetes mellitus without mention of complication, uncontrolled 09/20/2011  . Childhood asthma 09/20/2011  . Hyperlipidemia 11/08/2011  . Pre-ulcerative corn or callous 11/20/2011   Past Surgical History  Procedure Laterality Date  . Tonsillectomy  1985    reports that he has been smoking Cigarettes.  He has been smoking about 0.50 packs per day. He has never used smokeless tobacco. He reports that he drinks about 1.2 ounces of alcohol per week. He reports that he  does not use illicit drugs. family history includes Diabetes in his maternal grandfather, mother, and sister; Heart disease in his mother; Prostate cancer in his paternal grandfather; Transient ischemic attack in his mother. There is no history of Colon cancer. No Known Allergies Current Outpatient Prescriptions on File Prior to Visit  Medication Sig Dispense Refill  . diltiazem (CARDIZEM) 30 MG tablet TAKE ONE TABLET BY MOUTH THREE TIMES DAILY  270 tablet  3  . glipiZIDE (GLUCOTROL) 10 MG tablet Take 10 mg by mouth 2 (two) times daily before a meal.      . glipiZIDE (GLUCOTROL) 10 MG tablet TAKE ONE TABLET BY MOUTH TWICE DAILY BEFORE MEALS  180 tablet  2  . hydrochlorothiazide (HYDRODIURIL) 25 MG tablet       . hydrochlorothiazide (HYDRODIURIL) 25 MG tablet TAKE ONE TABLET BY MOUTH ONCE DAILY  90 tablet  3  . lisinopril (PRINIVIL,ZESTRIL) 20 MG tablet TAKE TWO TABLETS BY MOUTH ONCE DAILY  180 tablet  1  . lisinopril (PRINIVIL,ZESTRIL) 40 MG tablet Take 40 mg by mouth daily.      Marland Kitchen lovastatin (MEVACOR) 20 MG tablet TAKE TWO TABLETS BY MOUTH ONCE DAILY  180 tablet  3  . lovastatin (MEVACOR) 40 MG tablet Take 40 mg by mouth at bedtime.      . metFORMIN (GLUCOPHAGE) 1000 MG tablet       . metFORMIN (GLUCOPHAGE) 1000 MG tablet Take 1,000 mg by mouth 2 (two) times daily with a meal.      . metFORMIN (GLUCOPHAGE)  1000 MG tablet TAKE ONE TABLET BY MOUTH TWICE DAILY WITH MEALS  180 tablet  2  . Multiple Vitamin (MULTIVITAMIN) tablet Take 1 tablet by mouth daily.      . sitaGLIPtin (JANUVIA) 100 MG tablet Take 1 tablet (100 mg total) by mouth daily.  90 tablet  3   No current facility-administered medications on file prior to visit.   Review of Systems  Constitutional: Negative for unusual diaphoresis or other sweats  HENT: Negative for ringing in ear Eyes: Negative for double vision or worsening visual disturbance.  Respiratory: Negative for choking and stridor.   Gastrointestinal: Negative for  vomiting or other signifcant bowel change Genitourinary: Negative for hematuria or decreased urine volume.  Musculoskeletal: Negative for other MSK pain or swelling Skin: Negative for color change and worsening wound.  Neurological: Negative for tremors and numbness other than noted  Psychiatric/Behavioral: Negative for decreased concentration or agitation other than above       Objective:   Physical Exam BP 132/86  Pulse 99  Temp(Src) 98.6 F (37 C) (Oral)  Wt 185 lb 8 oz (84.142 kg)  SpO2 98% VS noted,  Constitutional: Pt appears well-developed, well-nourished.  HENT: Head: NCAT.  Right Ear: External ear normal.  Left Ear: External ear normal.  Eyes: . Pupils are equal, round, and reactive to light. Conjunctivae and EOM are normal Neck: Normal range of motion. Neck supple.  Cardiovascular: Normal rate and regular rhythm.   Pulmonary/Chest: Effort normal and breath sounds normal.  Abd:  Soft, NT, ND, + BS Neurological: Pt is alert. Not confused , motor grossly intact Skin: Skin is warm. No rash Psychiatric: Pt behavior is normal. No agitation. + dementia moderate Left knee with bony deg changes    Assessment & Plan:

## 2014-02-16 NOTE — Assessment & Plan Note (Signed)
stable overall by history and exam, recent data reviewed with pt, and pt to continue medical treatment as before,  to f/u any worsening symptoms or concerns Lab Results  Component Value Date   HGBA1C 6.2 02/13/2013

## 2014-02-16 NOTE — Assessment & Plan Note (Signed)
For Dr Smith/sport med referral for further eval and tx

## 2014-02-16 NOTE — Assessment & Plan Note (Signed)
stable overall by history and exam, recent data reviewed with pt, and pt to continue medical treatment as before,  to f/u any worsening symptoms or concerns BP Readings from Last 3 Encounters:  02/14/14 132/86  11/20/13 168/100  10/11/13 155/96

## 2014-02-16 NOTE — Assessment & Plan Note (Signed)
stable overall by history and exam, recent data reviewed with pt, and pt to continue medical treatment as before,  to f/u any worsening symptoms or concerns Lab Results  Component Value Date   LDLCALC 106* 02/13/2013

## 2014-02-19 ENCOUNTER — Other Ambulatory Visit: Payer: Self-pay | Admitting: Internal Medicine

## 2014-02-19 DIAGNOSIS — N289 Disorder of kidney and ureter, unspecified: Secondary | ICD-10-CM

## 2014-02-27 ENCOUNTER — Telehealth: Payer: Self-pay | Admitting: Internal Medicine

## 2014-02-27 ENCOUNTER — Ambulatory Visit (INDEPENDENT_AMBULATORY_CARE_PROVIDER_SITE_OTHER): Payer: Medicare Other | Admitting: Internal Medicine

## 2014-02-27 ENCOUNTER — Encounter: Payer: Self-pay | Admitting: Internal Medicine

## 2014-02-27 VITALS — BP 152/90 | HR 101 | Temp 98.2°F | Wt 185.0 lb

## 2014-02-27 DIAGNOSIS — N185 Chronic kidney disease, stage 5: Secondary | ICD-10-CM | POA: Insufficient documentation

## 2014-02-27 DIAGNOSIS — I10 Essential (primary) hypertension: Secondary | ICD-10-CM

## 2014-02-27 DIAGNOSIS — N183 Chronic kidney disease, stage 3 unspecified: Secondary | ICD-10-CM

## 2014-02-27 DIAGNOSIS — K5730 Diverticulosis of large intestine without perforation or abscess without bleeding: Secondary | ICD-10-CM

## 2014-02-27 DIAGNOSIS — E785 Hyperlipidemia, unspecified: Secondary | ICD-10-CM

## 2014-02-27 DIAGNOSIS — Z23 Encounter for immunization: Secondary | ICD-10-CM

## 2014-02-27 DIAGNOSIS — E1165 Type 2 diabetes mellitus with hyperglycemia: Secondary | ICD-10-CM

## 2014-02-27 DIAGNOSIS — IMO0001 Reserved for inherently not codable concepts without codable children: Secondary | ICD-10-CM

## 2014-02-27 HISTORY — DX: Diverticulosis of large intestine without perforation or abscess without bleeding: K57.30

## 2014-02-27 MED ORDER — ASPIRIN EC 81 MG PO TBEC: 81.0000 mg | DELAYED_RELEASE_TABLET | Freq: Every day | ORAL | Status: DC

## 2014-02-27 NOTE — Assessment & Plan Note (Signed)
Mild elev today, worried about starting insulin, declines med change for now BP Readings from Last 3 Encounters:  02/27/14 152/90  02/14/14 132/86  11/20/13 168/100

## 2014-02-27 NOTE — Assessment & Plan Note (Signed)
For better DM diet, cont same meds for now,  to f/u any worsening symptoms or concerns

## 2014-02-27 NOTE — Addendum Note (Signed)
Addended by: Sharon Seller B on: 02/27/2014 10:21 AM   Modules accepted: Orders

## 2014-02-27 NOTE — Progress Notes (Signed)
Pre visit review using our clinic review tool, if applicable. No additional management support is needed unless otherwise documented below in the visit note. 

## 2014-02-27 NOTE — Telephone Encounter (Signed)
Relevant patient education assigned to patient using Emmi. ° °

## 2014-02-27 NOTE — Progress Notes (Signed)
Subjective:    Patient ID: Christopher Lopez, male    DOB: [birth year], 53 y.o.   MRN: [id number]  HPI  Here to f/u; overall doing ok,  Pt denies chest pain, increased sob or doe, wheezing, orthopnea, PND, increased LE swelling, palpitations, dizziness or syncope.  Pt denies polydipsia, polyuria, or low sugar symptoms such as weakness or confusion improved with po intake.  Pt denies new neurological symptoms such as new headache, or facial or extremity weakness or numbness.   Pt states overall good compliance with meds, but now admits not been trying to follow lower cholesterol, diabetic diet prior to recent labs. Wife has allowed him to eat much sweets recent, but they both state they will work on diet, as this is preferable to insulin shots.  Declines DM education class for now.  Due for flu shot Past Medical History  Diagnosis Date  . Diabetes mellitus   . Hypertension   . Pneumonia 09/18/2011  . Anemia, unspecified 09/18/2011  . HTN (hypertension) 09/18/2011  . Impaired glucose tolerance 09/18/2011  . Colitis 09/18/2011  . Dementia 09/18/2011  . Alcohol abuse 09/18/2011  . Foot ulcer 09/18/2011  . Allergic rhinitis, cause unspecified 09/20/2011  . Type II or unspecified type diabetes mellitus without mention of complication, uncontrolled 09/20/2011  . Childhood asthma 09/20/2011  . Hyperlipidemia 11/08/2011  . Pre-ulcerative corn or callous 11/20/2011  . Diverticulosis of colon without hemorrhage 02/27/2014   Past Surgical History  Procedure Laterality Date  . Tonsillectomy  1985    reports that he has been smoking Cigarettes.  He has been smoking about 0.50 packs per day. He has never used smokeless tobacco. He reports that he drinks about 1.2 ounces of alcohol per week. He reports that he does not use illicit drugs. family history includes Diabetes in his maternal grandfather, mother, and sister; Heart disease in his mother; Prostate cancer in his paternal grandfather; Transient ischemic attack in his  mother. There is no history of Colon cancer. No Known Allergies Current Outpatient Prescriptions on File Prior to Visit  Medication Sig Dispense Refill  . diltiazem (CARDIZEM) 30 MG tablet TAKE ONE TABLET BY MOUTH THREE TIMES DAILY  270 tablet  3  . glipiZIDE (GLUCOTROL) 10 MG tablet TAKE ONE TABLET BY MOUTH TWICE DAILY BEFORE MEALS  180 tablet  2  . hydrochlorothiazide (HYDRODIURIL) 25 MG tablet TAKE ONE TABLET BY MOUTH ONCE DAILY  90 tablet  3  . lisinopril (PRINIVIL,ZESTRIL) 40 MG tablet Take 40 mg by mouth daily.      Marland Kitchen lovastatin (MEVACOR) 40 MG tablet Take 40 mg by mouth at bedtime.      . metFORMIN (GLUCOPHAGE) 1000 MG tablet Take 1,000 mg by mouth 2 (two) times daily with a meal.      . Multiple Vitamin (MULTIVITAMIN) tablet Take 1 tablet by mouth daily.      . sitaGLIPtin (JANUVIA) 100 MG tablet Take 1 tablet (100 mg total) by mouth daily.  90 tablet  3   No current facility-administered medications on file prior to visit.   Review of Systems  Constitutional: Negative for unusual diaphoresis or other sweats  HENT: Negative for ringing in ear Eyes: Negative for double vision or worsening visual disturbance.  Respiratory: Negative for choking and stridor.   Gastrointestinal: Negative for vomiting or other signifcant bowel change Genitourinary: Negative for hematuria or decreased urine volume.  Musculoskeletal: Negative for other MSK pain or swelling Skin: Negative for color change and worsening wound.  Neurological:  Negative for tremors and numbness other than noted  Psychiatric/Behavioral: Negative for decreased concentration or agitation other than above       Objective:   Physical Exam BP 152/90  Pulse 101  Temp(Src) 98.2 F (36.8 C) (Oral)  Wt 185 lb (83.915 kg)  SpO2 97% VS noted,  Constitutional: Pt appears well-developed, well-nourished.  HENT: Head: NCAT.  Right Ear: External ear normal.  Left Ear: External ear normal.  Eyes: . Pupils are equal, round, and  reactive to light. Conjunctivae and EOM are normal Neck: Normal range of motion. Neck supple.  Cardiovascular: Normal rate and regular rhythm.   Pulmonary/Chest: Effort normal and breath sounds normal.  Neurological: Pt is alert. Not confused , motor grossly intact Skin: Skin is warm. No rash Psychiatric: Pt behavior is normal. No agitation.     Assessment & Plan:   BP Readings from Last 3 Encounters:  02/27/14 152/90  02/14/14 132/86  11/20/13 168/100

## 2014-02-27 NOTE — Patient Instructions (Signed)
You had the flu shot today  Please continue all other medications as before, and refills have been done if requested.  Please have the pharmacy call with any other refills you may need.  Please continue your efforts at being more active, low cholesterol diet, and weight control.  You are otherwise up to date with prevention measures today.  Please keep your appointments with your specialists as you may have planned - nephrology (you should be called soon)  Please call if you change your mind about the Diabetes Education Class

## 2014-02-27 NOTE — Assessment & Plan Note (Signed)
Lab Results  Component Value Date   LDLCALC 116* 02/14/2014   Goal ldl < 70, delcines change in statin for now, wants to try diet

## 2014-02-27 NOTE — Assessment & Plan Note (Signed)
voilume stable, for renal referral new pt soon

## 2014-03-01 ENCOUNTER — Other Ambulatory Visit: Payer: Self-pay | Admitting: Nephrology

## 2014-03-01 DIAGNOSIS — N183 Chronic kidney disease, stage 3 unspecified: Secondary | ICD-10-CM

## 2014-03-05 ENCOUNTER — Encounter: Payer: Self-pay | Admitting: Family Medicine

## 2014-03-05 ENCOUNTER — Ambulatory Visit (INDEPENDENT_AMBULATORY_CARE_PROVIDER_SITE_OTHER)
Admission: RE | Admit: 2014-03-05 | Discharge: 2014-03-05 | Disposition: A | Discharge status: Home / Self Care | Payer: Medicare Other | Source: Ambulatory Visit | Attending: Family Medicine | Admitting: Family Medicine

## 2014-03-05 ENCOUNTER — Other Ambulatory Visit (INDEPENDENT_AMBULATORY_CARE_PROVIDER_SITE_OTHER): Payer: Medicare Other

## 2014-03-05 ENCOUNTER — Ambulatory Visit (INDEPENDENT_AMBULATORY_CARE_PROVIDER_SITE_OTHER): Payer: Medicare Other | Admitting: Family Medicine

## 2014-03-05 VITALS — BP 132/76 | HR 106 | Ht 72.0 in | Wt 186.0 lb

## 2014-03-05 DIAGNOSIS — M25562 Pain in left knee: Secondary | ICD-10-CM

## 2014-03-05 DIAGNOSIS — M216X9 Other acquired deformities of unspecified foot: Secondary | ICD-10-CM

## 2014-03-05 DIAGNOSIS — M25569 Pain in unspecified knee: Secondary | ICD-10-CM

## 2014-03-05 DIAGNOSIS — M21372 Foot drop, left foot: Secondary | ICD-10-CM | POA: Insufficient documentation

## 2014-03-05 NOTE — Progress Notes (Signed)
[facility] [address] [place], [place] [address] Phone: [phone] Subjective:    I'm seeing this patient by the request  of:  Cathlean Cower, MD   CC: Left knee pain  [id number] Christopher Lopez is a 53 y.o. male coming in with complaint of left knee pain. Patient states that he has had this pain since 2011 but it seems to be getting worse especially over the course last week. Patient does not remember any fall but states that the pain seems to be worse especially with extension of the knee. Patient has been walking and has noticed some mild weakness overall. Denies though any following, any numbness, any radiation down the leg. Patient is accompanied with his mother who helps him on a daily basis. Patient has been walking with a cane for the last 4 years since returning from [place] states his mother. Patient describes the pain as more of a dull aching pain. States that the pain is 5/10. Patient states that he was an alcoholic but he has not been drinking any more so he denies any type of falls. Denies any nighttime awakening.     Past medical history, social, surgical and family history all reviewed in electronic medical record.   Review of Systems: No headache, visual changes, nausea, vomiting, diarrhea, constipation, dizziness, abdominal pain, skin rash, fevers, chills, night sweats, weight loss, swollen lymph nodes, body aches, joint swelling, muscle aches, chest pain, shortness of breath, mood changes.   Objective Blood pressure 132/76, pulse 106, height 6' (1.829 m), weight 186 lb (84.369 kg), SpO2 98.00%.  General: No apparent distress alert and oriented x3 mood and affect normal, dressed appropriately.  HEENT: Pupils equal, extraocular movements intact  Respiratory: Patient's speak in full sentences and does not appear short of breath  Cardiovascular: No lower extremity edema, non tender, no erythema  Skin: Warm dry intact with no signs of  infection or rash on extremities or on axial skeleton.  Abdomen: Soft nontender  Neuro: Cranial nerves II through XII are intact, neurovascularly intact in all extremities with 2+ DTRs and 2+ pulses.  Lymph: No lymphadenopathy of posterior or anterior cervical chain or axillae bilaterally.  Gait ambulates with a cane and does have foot drop on the left side MSK:  Non tender with full range of motion and good stability and symmetric strength and tone of shoulders, elbows, wrist, hip, and ankles bilaterally.  Knee: Left Normal to inspection with no erythema or effusion or obvious bony abnormalities. Mild atrophy of the quadriceps compared to the contralateral side Minimal joint line tenderness ROM full in flexion and extension and lower leg rotation. Ligaments with solid consistent endpoints including ACL, PCL, LCL, MCL. Minorly positive Mcmurray's, Apley's, and Thessalonian tests. Non painful patellar compression. Patellar glide with minimal crepitus. Patellar and quadriceps tendons unremarkable. Hamstring and quadriceps strength is normal.  Patient though does have weakness with ankle extension mild foot drop noted Contralateral knee unremarkable  MSK US performed of: Left This study was ordered, performed, and interpreted by Charlann Boxer D.O.  Knee: All structures visualized. Anteromedial, anterolateral, posteromedial, and posterolateral menisci unremarkable without tearing, fraying, effusion, or displacement. Minimal narrowing of the medial and lateral joint lines Patellar Tendon unremarkable on long and transverse views without effusion. No abnormality of prepatellar bursa. LCL and MCL unremarkable on long and transverse views. No abnormality of origin of medial or lateral head of the gastrocnemius.  IMPRESSION:  Relatively normal with minimal arthritis.  After informed  written and verbal consent, patient was seated on exam table. Right knee was prepped with alcohol swab and  utilizing anterolateral approach, patient's right knee space was injected with 4:1  marcaine 0.5%: Kenalog 40mg /dL. Patient tolerated the procedure well without immediate complications.      Impression and Recommendations:     This case required medical decision making of moderate complexity.

## 2014-03-05 NOTE — Assessment & Plan Note (Signed)
Patient does have foot drop and weakness of the S1 nerve root. Patient denies any back pain that is associated with a. Patient does have labs have normalized over the years but continues to have a mildly increased white blood cell count. Patient states that he is no longer to drinking. Patient does have chronic kidney disease. We may want to consider at some point further workup and will discuss with primary care provider. Other etiology would include his history of alcohol abuse been the most likely but also considering workup for HIV and autoimmune diseases.

## 2014-03-05 NOTE — Patient Instructions (Signed)
Good to meet you You have weakness in your left leg we will  Monitor.  B12 159mcg daily Vitamin D 2000 IU daily.  Ice 10 minutes 2 times a day to the knee.  Exercises 3 times a week.  Xrays downstairs today Come back in 3-4 weeks to make sure you are doing better.

## 2014-03-05 NOTE — Assessment & Plan Note (Signed)
Patient's left knee pain seems to be nondescript button patient is having significant pain over all. Patient is having difficulty with ambulation and is unable to take oral medications secondary to his other comorbidities. Injection was done today which I hope will be helpful. Patient was given a brace as well but I hope also be helpful. Patient does have some mild weakness of the lower extremity that were going to monitor. Think this is likely secondary to his alcohol abuse and possibly exam neurologic or status post CVA has not been worked up. Patient is already on secondary prevention some no need to change care for that though. Patient will try home exercise program, vitamin supplementation, and come back again in 3 weeks.

## 2014-03-08 ENCOUNTER — Ambulatory Visit
Admission: RE | Admit: 2014-03-08 | Discharge: 2014-03-08 | Disposition: A | Discharge status: Home / Self Care | Payer: Medicare Other | Source: Ambulatory Visit | Attending: Nephrology | Admitting: Nephrology

## 2014-03-08 DIAGNOSIS — N183 Chronic kidney disease, stage 3 unspecified: Secondary | ICD-10-CM

## 2014-04-02 ENCOUNTER — Ambulatory Visit: Payer: Medicare Other | Admitting: Family Medicine

## 2014-04-04 ENCOUNTER — Ambulatory Visit: Payer: Medicare Other | Admitting: Family Medicine

## 2014-04-22 ENCOUNTER — Telehealth: Payer: Self-pay

## 2014-04-22 NOTE — Telephone Encounter (Signed)
Spoke with patient's mother, Ms. Shella Spearing, who stated that patient has not had an eye exam this year, but she plans to schedule one before the end of the year.

## 2014-04-25 ENCOUNTER — Ambulatory Visit (INDEPENDENT_AMBULATORY_CARE_PROVIDER_SITE_OTHER): Payer: Medicare Other | Admitting: Podiatry

## 2014-04-25 DIAGNOSIS — E119 Type 2 diabetes mellitus without complications: Secondary | ICD-10-CM

## 2014-04-25 DIAGNOSIS — M79673 Pain in unspecified foot: Secondary | ICD-10-CM

## 2014-04-25 DIAGNOSIS — Q828 Other specified congenital malformations of skin: Secondary | ICD-10-CM

## 2014-04-25 DIAGNOSIS — B351 Tinea unguium: Secondary | ICD-10-CM

## 2014-04-25 NOTE — Progress Notes (Signed)
   Subjective:    Patient ID: Christopher Lopez, male    DOB: 26-Apr-1961, 53 y.o.   MRN: [id number]  HPI  Pt presents for nail debridement  Review of Systems     Objective:   Physical Exam: I have reviewed his past medical history medications allergies surgeries social history and review of systems. Pulses are strongly palpable nails are thick yellow dystrophic clinically mycotic and painful palpation. Porokeratotic lesions plantar aspect of the bilateral foot present.        Assessment & Plan:  Assessment: Pain in limb secondary to diabetes. Pain in limb secondary to onychomycosis 1 through 5 bilateral. Painful porokeratosis bilateral.  Plan: Debridement of nails 1 through 5 bilateral covered service secondary to pain and debridement of lesions 1 through 5 bilateral.

## 2014-04-26 ENCOUNTER — Other Ambulatory Visit: Payer: Self-pay

## 2014-06-02 ENCOUNTER — Other Ambulatory Visit: Payer: Self-pay | Admitting: Internal Medicine

## 2014-07-30 ENCOUNTER — Ambulatory Visit (INDEPENDENT_AMBULATORY_CARE_PROVIDER_SITE_OTHER): Payer: Medicare Other | Admitting: Podiatrist

## 2014-07-30 ENCOUNTER — Encounter: Payer: Self-pay | Admitting: Podiatrist

## 2014-07-30 DIAGNOSIS — M79676 Pain in unspecified toe(s): Secondary | ICD-10-CM | POA: Diagnosis not present

## 2014-07-30 DIAGNOSIS — B351 Tinea unguium: Secondary | ICD-10-CM

## 2014-07-30 DIAGNOSIS — E119 Type 2 diabetes mellitus without complications: Secondary | ICD-10-CM

## 2014-07-30 DIAGNOSIS — Q828 Other specified congenital malformations of skin: Secondary | ICD-10-CM | POA: Diagnosis not present

## 2014-07-30 MED ORDER — UREA 40 % EX CREA: 1.0000 "application " | TOPICAL_CREAM | Freq: Two times a day (BID) | CUTANEOUS | Status: DC

## 2014-07-30 NOTE — Patient Instructions (Signed)
Diabetes and Foot Care Diabetes may cause you to have problems because of poor blood supply (circulation) to your feet and legs. This may cause the skin on your feet to become thinner, break easier, and heal more slowly. Your skin may become dry, and the skin may peel and crack. You may also have nerve damage in your legs and feet causing decreased feeling in them. You may not notice minor injuries to your feet that could lead to infections or more serious problems. Taking care of your feet is one of the most important things you can do for yourself.  HOME CARE INSTRUCTIONS  Wear shoes at all times, even in the house. Do not go barefoot. Bare feet are easily injured.  Check your feet daily for blisters, cuts, and redness. If you cannot see the bottom of your feet, use a mirror or ask someone for help.  Wash your feet with warm water (do not use hot water) and mild soap. Then pat your feet and the areas between your toes until they are completely dry. Do not soak your feet as this can dry your skin.  Apply a moisturizing lotion or petroleum jelly (that does not contain alcohol and is unscented) to the skin on your feet and to dry, brittle toenails. Do not apply lotion between your toes.  Trim your toenails straight across. Do not dig under them or around the cuticle. File the edges of your nails with an emery board or nail file.  Do not cut corns or calluses or try to remove them with medicine.  Wear clean socks or stockings every day. Make sure they are not too tight. Do not wear knee-high stockings since they may decrease blood flow to your legs.  Wear shoes that fit properly and have enough cushioning. To break in new shoes, wear them for just a few hours a day. This prevents you from injuring your feet. Always look in your shoes before you put them on to be sure there are no objects inside.  Do not cross your legs. This may decrease the blood flow to your feet.  If you find a minor scrape,  cut, or break in the skin on your feet, keep it and the skin around it clean and dry. These areas may be cleansed with mild soap and water. Do not cleanse the area with peroxide, alcohol, or iodine.  When you remove an adhesive bandage, be sure not to damage the skin around it.  If you have a wound, look at it several times a day to make sure it is healing.  Do not use heating pads or hot water bottles. They may burn your skin. If you have lost feeling in your feet or legs, you may not know it is happening until it is too late.  Make sure your health care provider performs a complete foot exam at least annually or more often if you have foot problems. Report any cuts, sores, or bruises to your health care provider immediately. SEEK MEDICAL CARE IF:   You have an injury that is not healing.  You have cuts or breaks in the skin.  You have an ingrown nail.  You notice redness on your legs or feet.  You feel burning or tingling in your legs or feet.  You have pain or cramps in your legs and feet.  Your legs or feet are numb.  Your feet always feel cold. SEEK IMMEDIATE MEDICAL CARE IF:   There is increasing redness,   swelling, or pain in or around a wound.  There is a red line that goes up your leg.  Pus is coming from a wound.  You develop a fever or as directed by your health care provider.  You notice a bad smell coming from an ulcer or wound. Document Released: 06/25/2000 Document Revised: 02/28/2013 Document Reviewed: 12/05/2012 ExitCare Patient Information 2015 [employer]. This information is not intended to replace advice given to you by your health care provider. Make sure you discuss any questions you have with your health care provider.  

## 2014-07-30 NOTE — Progress Notes (Signed)
  HPI: Patient presents today for follow up of diabetic foot and nail care. No new complaints today  Objective:  Neurovascular status unchanged with palpable pedal pulses and neurological sensation decreased to bilateral feet.  Severely Thick  nails  Which are elongated, thickened, discolored, dystrophic with ingrown deformity present x 10.  + hyperkeratotic and pre-ulcerative lesions present submetatarsal 4 bilateral.  Significant scaley xerosis is noted to bilateral feet.  + interdigital maceration present   Assessment: Diabetes with Neuropathy , Ingrown nail deformity, hyperkeratotic lesion x 2  Plan: Discussed treatment options and alternatives. Debrided nails as best as I could considering the severity and thickness.  Debrided hyperkeratotic lesions without complication.  Return appointment recommended at routine intervals of 3 months.or per patients request   Trudie Buckler, DPM

## 2014-08-01 ENCOUNTER — Other Ambulatory Visit: Payer: Medicare Other

## 2014-08-15 ENCOUNTER — Other Ambulatory Visit (INDEPENDENT_AMBULATORY_CARE_PROVIDER_SITE_OTHER): Payer: Self-pay

## 2014-08-15 ENCOUNTER — Other Ambulatory Visit: Payer: Self-pay | Admitting: Nephrology

## 2014-08-15 DIAGNOSIS — N183 Chronic kidney disease, stage 3 (moderate): Secondary | ICD-10-CM | POA: Diagnosis not present

## 2014-08-15 DIAGNOSIS — D631 Anemia in chronic kidney disease: Secondary | ICD-10-CM | POA: Diagnosis not present

## 2014-08-15 DIAGNOSIS — I129 Hypertensive chronic kidney disease with stage 1 through stage 4 chronic kidney disease, or unspecified chronic kidney disease: Secondary | ICD-10-CM | POA: Diagnosis not present

## 2014-08-15 DIAGNOSIS — E785 Hyperlipidemia, unspecified: Secondary | ICD-10-CM | POA: Diagnosis not present

## 2014-08-15 DIAGNOSIS — Z0000 Encounter for general adult medical examination without abnormal findings: Secondary | ICD-10-CM

## 2014-08-15 DIAGNOSIS — N189 Chronic kidney disease, unspecified: Secondary | ICD-10-CM | POA: Diagnosis not present

## 2014-08-15 LAB — LIPID PANEL
Cholesterol: 162 mg/dL (ref 0–200)
HDL: 40.6 mg/dL (ref >39.00)
LDL CALC: 101 mg/dL — AB (ref 0–99)
NONHDL: 121.4
Total CHOL/HDL Ratio: 4
Triglycerides: 101 mg/dL (ref 0.0–149.0)
VLDL: 20.2 mg/dL (ref 0.0–40.0)

## 2014-08-15 LAB — BASIC METABOLIC PANEL
BUN: 20 mg/dL (ref 6–23)
CHLORIDE: 104 meq/L (ref 96–112)
CO2: 25 mEq/L (ref 19–32)
Calcium: 9.4 mg/dL (ref 8.4–10.5)
Creatinine, Ser: 1.88 mg/dL — ABNORMAL HIGH (ref 0.40–1.50)
GFR: 48.17 mL/min — AB (ref >60.00)
GLUCOSE: 94 mg/dL (ref 70–99)
POTASSIUM: 3.6 meq/L (ref 3.5–5.1)
Sodium: 137 mEq/L (ref 135–145)

## 2014-08-15 LAB — HEPATIC FUNCTION PANEL
ALBUMIN: 4.1 g/dL (ref 3.5–5.2)
ALK PHOS: 66 U/L (ref 39–117)
ALT: 13 U/L (ref 0–53)
AST: 14 U/L (ref 0–37)
BILIRUBIN DIRECT: 0 mg/dL (ref 0.0–0.3)
Total Bilirubin: 0.3 mg/dL (ref 0.2–1.2)
Total Protein: 8 g/dL (ref 6.0–8.3)

## 2014-08-20 ENCOUNTER — Ambulatory Visit (INDEPENDENT_AMBULATORY_CARE_PROVIDER_SITE_OTHER): Payer: Medicare Other | Admitting: Internal Medicine

## 2014-08-20 ENCOUNTER — Encounter: Payer: Self-pay | Admitting: Internal Medicine

## 2014-08-20 VITALS — BP 140/102 | HR 98 | Temp 98.3°F | Ht 72.0 in | Wt 182.0 lb

## 2014-08-20 DIAGNOSIS — Z0000 Encounter for general adult medical examination without abnormal findings: Secondary | ICD-10-CM | POA: Diagnosis not present

## 2014-08-20 DIAGNOSIS — I10 Essential (primary) hypertension: Secondary | ICD-10-CM

## 2014-08-20 DIAGNOSIS — Z23 Encounter for immunization: Secondary | ICD-10-CM | POA: Diagnosis not present

## 2014-08-20 MED ORDER — LISINOPRIL 20 MG PO TABS
20.0000 mg | ORAL_TABLET | Freq: Two times a day (BID) | ORAL | Status: DC
Start: 2014-08-20 — End: 2014-11-28

## 2014-08-20 MED ORDER — LISINOPRIL 20 MG PO TABS: 40.0000 mg | ORAL_TABLET | Freq: Every day | ORAL | Status: DC

## 2014-08-20 MED ORDER — SITAGLIPTIN PHOSPHATE 100 MG PO TABS: 100.0000 mg | ORAL_TABLET | Freq: Every day | ORAL | Status: DC

## 2014-08-20 NOTE — Progress Notes (Signed)
Subjective:    Patient ID: Christopher Lopez, male    DOB: 1961-04-20, 54 y.o.   MRN: [id number]  HPI  Here for wellness and f/u with wife who helps with hx due to patient dementia;  Overall doing ok;  Pt denies CP, worsening SOB, DOE, wheezing, orthopnea, PND, worsening LE edema, palpitations, dizziness or syncope.  Pt denies neurological change such as new headache, facial or extremity weakness.  Pt denies polydipsia, polyuria, or low sugar symptoms. Pt states overall good compliance with treatment and medications, good tolerability, and has been trying to follow lower cholesterol diet.  Pt denies worsening depressive symptoms, suicidal ideation or panic. No fever, night sweats, wt loss, loss of appetite, or other constitutional symptoms.  Pt states good ability with ADL's, has low fall risk, home safety reviewed and adequate, no other significant changes in hearing or vision, and only occasionally active with exercise. BP being tx per renal - Med changed per renal to dilt ER 240 qd,  BP 140/100 initially last wk, then 132/82 after.   No acute complaints Past Medical History  Diagnosis Date  . Diabetes mellitus   . Hypertension   . Pneumonia 09/18/2011  . Anemia, unspecified 09/18/2011  . HTN (hypertension) 09/18/2011  . Impaired glucose tolerance 09/18/2011  . Colitis 09/18/2011  . Dementia 09/18/2011  . Alcohol abuse 09/18/2011  . Foot ulcer 09/18/2011  . Allergic rhinitis, cause unspecified 09/20/2011  . Type II or unspecified type diabetes mellitus without mention of complication, uncontrolled 09/20/2011  . Childhood asthma 09/20/2011  . Hyperlipidemia 11/08/2011  . Pre-ulcerative corn or callous 11/20/2011  . Diverticulosis of colon without hemorrhage 02/27/2014   Past Surgical History  Procedure Laterality Date  . Tonsillectomy  1985    reports that he has been smoking Cigarettes.  He has been smoking about 0.50 packs per day. He has never used smokeless tobacco. He reports that he drinks about 1.2 oz  of alcohol per week. He reports that he does not use illicit drugs. family history includes Diabetes in his maternal grandfather, mother, and sister; Heart disease in his mother; Prostate cancer in his paternal grandfather; Transient ischemic attack in his mother. There is no history of Colon cancer. No Known Allergies Current Outpatient Prescriptions on File Prior to Visit  Medication Sig Dispense Refill  . aspirin EC 81 MG tablet Take 1 tablet (81 mg total) by mouth daily. 90 tablet 11  . glipiZIDE (GLUCOTROL) 10 MG tablet TAKE ONE TABLET BY MOUTH TWICE DAILY BEFORE MEALS 180 tablet 2  . hydrochlorothiazide (HYDRODIURIL) 25 MG tablet TAKE ONE TABLET BY MOUTH ONCE DAILY 90 tablet 3  . lovastatin (MEVACOR) 40 MG tablet Take 40 mg by mouth at bedtime.    . metFORMIN (GLUCOPHAGE) 1000 MG tablet Take 1,000 mg by mouth 2 (two) times daily with a meal.    . Multiple Vitamin (MULTIVITAMIN) tablet Take 1 tablet by mouth daily.    . urea (CARMOL) 40 % CREA Apply 1 application topically 2 (two) times daily. 1 each 3   No current facility-administered medications on file prior to visit.    Review of Systems Constitutional: Negative for increased diaphoresis, other activity, appetite or other siginficant weight change  HENT: Negative for worsening hearing loss, ear pain, facial swelling, mouth sores and neck stiffness.   Eyes: Negative for other worsening pain, redness or visual disturbance.  Respiratory: Negative for shortness of breath and wheezing.   Cardiovascular: Negative for chest pain and palpitations.  Gastrointestinal: Negative  for diarrhea, blood in stool, abdominal distention or other pain Genitourinary: Negative for hematuria, flank pain or change in urine volume.  Musculoskeletal: Negative for myalgias or other joint complaints.  Skin: Negative for color change and wound.  Neurological: Negative for syncope and numbness. other than noted Hematological: Negative for adenopathy. or other  swelling Psychiatric/Behavioral: Negative for hallucinations, self-injury, decreased concentration or other worsening agitation.      Objective:   Physical Exam BP 140/102 mmHg  Pulse 98  Temp(Src) 98.3 F (36.8 C) (Oral)  Ht 6' (1.829 m)  Wt 182 lb (82.555 kg)  BMI 24.68 kg/m2 VS noted,  Constitutional: Pt is oriented to person, place, and time. Appears well-developed and well-nourished.  Head: Normocephalic and atraumatic.  Right Ear: External ear normal.  Left Ear: External ear normal.  Nose: Nose normal.  Mouth/Throat: Oropharynx is clear and moist.  Eyes: Conjunctivae and EOM are normal. Pupils are equal, round, and reactive to light.  Neck: Normal range of motion. Neck supple. No JVD present. No tracheal deviation present.  Cardiovascular: Normal rate, regular rhythm, normal heart sounds and intact distal pulses.   Pulmonary/Chest: Effort normal and breath sounds without rales or wheezing  Abdominal: Soft. Bowel sounds are normal. NT. No HSM  Musculoskeletal: Normal range of motion. Exhibits no edema.  Lymphadenopathy:  Has no cervical adenopathy.  Neurological: Pt is alert and oriented to person,  Pt has normal reflexes. No cranial nerve deficit. Motor grossly intact, no change in left foot drop, walks with cane Skin: Skin is warm and dry. No rash noted.  Psychiatric:  Has normal mood and affect. Behavior is normal.     Assessment & Plan:

## 2014-08-20 NOTE — Progress Notes (Signed)
Pre visit review using our clinic review tool, if applicable. No additional management support is needed unless otherwise documented below in the visit note. 

## 2014-08-20 NOTE — Patient Instructions (Addendum)
You had the Pneumovax shot today  Please continue all other medications as before, and refills have been done if requested - the enalapril   You are given the hardcopy for the Januvia  Please have the pharmacy call with any other refills you may need.  Please continue your efforts at being more active, low cholesterol diet, and weight control.  You are otherwise up to date with prevention measures today.  Please keep your appointments with your specialists as you may have planned  Your lab work was OK today  Please return in 6 months, or sooner if needed

## 2014-08-21 ENCOUNTER — Telehealth: Payer: Self-pay | Admitting: Internal Medicine

## 2014-08-21 NOTE — Telephone Encounter (Signed)
emmi mailed  °

## 2014-08-26 NOTE — Assessment & Plan Note (Signed)

## 2014-08-26 NOTE — Assessment & Plan Note (Signed)
D/w wife, pt and wife prefer to cont current meds, state they believe he has white coat HTN, will cont to monitor at home and provider visits

## 2014-09-13 ENCOUNTER — Telehealth: Payer: Self-pay | Admitting: Internal Medicine

## 2014-09-13 NOTE — Telephone Encounter (Signed)
Pt' mother called stated he never got his medication for Januvia from pt assistant. Please check

## 2014-09-13 NOTE — Telephone Encounter (Signed)
Mailed form back to [employer] Patient assistance.

## 2014-10-31 ENCOUNTER — Other Ambulatory Visit: Payer: Self-pay

## 2014-10-31 ENCOUNTER — Ambulatory Visit: Payer: Medicare Other

## 2014-11-01 ENCOUNTER — Other Ambulatory Visit: Payer: Self-pay | Admitting: Internal Medicine

## 2014-11-01 MED ORDER — ONETOUCH ULTRA 2 W/DEVICE KIT: PACK | Status: DC

## 2014-11-01 MED ORDER — ONETOUCH ULTRASOFT LANCETS MISC: 1.0000 | Freq: Two times a day (BID) | Status: DC

## 2014-11-01 MED ORDER — GLUCOSE BLOOD VI STRP: 1.0000 | ORAL_STRIP | Freq: Two times a day (BID) | Status: DC

## 2014-11-01 NOTE — Telephone Encounter (Signed)
Faxed BS monitor & supplies to [facility]...Christopher Lopez

## 2014-11-28 ENCOUNTER — Other Ambulatory Visit: Payer: Self-pay

## 2014-11-28 MED ORDER — LISINOPRIL 20 MG PO TABS: 20.0000 mg | ORAL_TABLET | Freq: Two times a day (BID) | ORAL | Status: DC

## 2014-11-28 MED ORDER — METFORMIN HCL 1000 MG PO TABS: 1000.0000 mg | ORAL_TABLET | Freq: Two times a day (BID) | ORAL | Status: DC

## 2014-11-28 MED ORDER — GLIPIZIDE 10 MG PO TABS: ORAL_TABLET | ORAL | Status: DC

## 2014-11-28 MED ORDER — LOVASTATIN 20 MG PO TABS: 40.0000 mg | ORAL_TABLET | Freq: Every day | ORAL | Status: DC

## 2014-11-28 MED ORDER — HYDROCHLOROTHIAZIDE 25 MG PO TABS: 25.0000 mg | ORAL_TABLET | Freq: Every day | ORAL | Status: DC

## 2014-11-28 NOTE — Telephone Encounter (Signed)
Received a fax from [facility] stating that the patient wanted to start receiving his medication from them. I sent over the medications that were requested.

## 2014-11-29 ENCOUNTER — Other Ambulatory Visit: Payer: Self-pay

## 2014-11-29 MED ORDER — LOVASTATIN 40 MG PO TABS: 40.0000 mg | ORAL_TABLET | Freq: Every day | ORAL | Status: DC

## 2014-11-29 NOTE — Telephone Encounter (Signed)
Tried to get the lovastatin 20mg  twice a day approved, insurance denied it. Insurance stated they are willing to cover the 40mg  once a day. New prescription has been sent to pharmacy. PA for the 20mg  has been cancelled.

## 2015-01-06 ENCOUNTER — Other Ambulatory Visit: Payer: Self-pay

## 2015-01-24 DIAGNOSIS — D631 Anemia in chronic kidney disease: Secondary | ICD-10-CM | POA: Diagnosis not present

## 2015-01-24 DIAGNOSIS — I129 Hypertensive chronic kidney disease with stage 1 through stage 4 chronic kidney disease, or unspecified chronic kidney disease: Secondary | ICD-10-CM | POA: Diagnosis not present

## 2015-01-24 DIAGNOSIS — N183 Chronic kidney disease, stage 3 (moderate): Secondary | ICD-10-CM | POA: Diagnosis not present

## 2015-01-24 DIAGNOSIS — E785 Hyperlipidemia, unspecified: Secondary | ICD-10-CM | POA: Diagnosis not present

## 2015-02-20 ENCOUNTER — Encounter: Payer: Self-pay | Admitting: Internal Medicine

## 2015-02-20 ENCOUNTER — Ambulatory Visit (INDEPENDENT_AMBULATORY_CARE_PROVIDER_SITE_OTHER): Payer: Medicare Other | Admitting: Internal Medicine

## 2015-02-20 ENCOUNTER — Other Ambulatory Visit (INDEPENDENT_AMBULATORY_CARE_PROVIDER_SITE_OTHER): Payer: Medicare Other

## 2015-02-20 VITALS — BP 122/86 | HR 95 | Temp 98.1°F | Ht 72.0 in | Wt 167.0 lb

## 2015-02-20 DIAGNOSIS — E119 Type 2 diabetes mellitus without complications: Secondary | ICD-10-CM | POA: Diagnosis not present

## 2015-02-20 DIAGNOSIS — N183 Chronic kidney disease, stage 3 (moderate): Secondary | ICD-10-CM | POA: Diagnosis not present

## 2015-02-20 DIAGNOSIS — I10 Essential (primary) hypertension: Secondary | ICD-10-CM | POA: Diagnosis not present

## 2015-02-20 DIAGNOSIS — E785 Hyperlipidemia, unspecified: Secondary | ICD-10-CM | POA: Diagnosis not present

## 2015-02-20 DIAGNOSIS — Z0189 Encounter for other specified special examinations: Secondary | ICD-10-CM

## 2015-02-20 DIAGNOSIS — Z0000 Encounter for general adult medical examination without abnormal findings: Secondary | ICD-10-CM

## 2015-02-20 LAB — BASIC METABOLIC PANEL
BUN: 26 mg/dL — AB (ref 6–23)
CO2: 28 meq/L (ref 19–32)
CREATININE: 2.18 mg/dL — AB (ref 0.40–1.50)
Calcium: 10.3 mg/dL (ref 8.4–10.5)
Chloride: 102 mEq/L (ref 96–112)
GFR: 40.52 mL/min — ABNORMAL LOW (ref >60.00)
GLUCOSE: 95 mg/dL (ref 70–99)
Potassium: 3.8 mEq/L (ref 3.5–5.1)
Sodium: 141 mEq/L (ref 135–145)

## 2015-02-20 LAB — HEPATIC FUNCTION PANEL
ALK PHOS: 63 U/L (ref 39–117)
ALT: 17 U/L (ref 0–53)
AST: 15 U/L (ref 0–37)
Albumin: 4.4 g/dL (ref 3.5–5.2)
Bilirubin, Direct: 0 mg/dL (ref 0.0–0.3)
Total Bilirubin: 0.3 mg/dL (ref 0.2–1.2)
Total Protein: 8.8 g/dL — ABNORMAL HIGH (ref 6.0–8.3)

## 2015-02-20 LAB — LIPID PANEL
CHOLESTEROL: 175 mg/dL (ref 0–200)
HDL: 43.5 mg/dL (ref >39.00)
LDL Cholesterol: 107 mg/dL — ABNORMAL HIGH (ref 0–99)
NONHDL: 131.65
Total CHOL/HDL Ratio: 4
Triglycerides: 125 mg/dL (ref 0.0–149.0)
VLDL: 25 mg/dL (ref 0.0–40.0)

## 2015-02-20 LAB — HEMOGLOBIN A1C: HEMOGLOBIN A1C: 6.1 % (ref 4.6–6.5)

## 2015-02-20 NOTE — Assessment & Plan Note (Signed)
stable overall by history and exam, recent data reviewed with pt, and pt to continue medical treatment as before,  to f/u any worsening symptoms or concerns Lab Results  Component Value Date   HGBA1C 8.4* 02/14/2014

## 2015-02-20 NOTE — Assessment & Plan Note (Signed)
stable overall by history and exam, recent data reviewed with pt, and pt to continue medical treatment as before,  to f/u any worsening symptoms or concerns BP Readings from Last 3 Encounters:  02/20/15 122/86  08/20/14 140/102  03/05/14 132/76

## 2015-02-20 NOTE — Assessment & Plan Note (Signed)
stable overall by history and exam, recent data reviewed with pt, and pt to continue medical treatment as before,  to f/u any worsening symptoms or concerns Lab Results  Component Value Date   LDLCALC 101* 08/15/2014

## 2015-02-20 NOTE — Assessment & Plan Note (Signed)
stable overall by history and exam, recent data reviewed with pt, and pt to continue medical treatment as before,  to f/u any worsening symptoms or concerns Lab Results  Component Value Date   CREATININE 1.88* 08/15/2014   To f;u with renal as planned

## 2015-02-20 NOTE — Progress Notes (Signed)
Pre visit review using our clinic review tool, if applicable. No additional management support is needed unless otherwise documented below in the visit note. 

## 2015-02-20 NOTE — Patient Instructions (Signed)
Please continue all other medications as before, and refills have been done if requested.  Please have the pharmacy call with any other refills you may need.  Please continue your efforts at being more active, low cholesterol diet, and weight control.  You are otherwise up to date with prevention measures today.  Please keep your appointments with your specialists as you may have planned - Dr Shan Levans  OK to take the tylenol arthritis for the neck pain  Please go to the LAB in the Basement (turn left off the elevator) for the tests to be done today  You will be contacted by phone if any changes need to be made immediately.  Otherwise, you will receive a letter about your results with an explanation, but please check with MyChart first.  Please remember to sign up for MyChart if you have not done so, as this will be important to you in the future with finding out test results, communicating by private email, and scheduling acute appointments online when needed.  Please return in 6 months, or sooner if needed, with Lab testing done 3-5 days before

## 2015-02-20 NOTE — Progress Notes (Signed)
Subjective:    Patient ID: Christopher Lopez, male    DOB: 1961/06/17, 54 y.o.   MRN: [id number]  HPI  Here to f/u; overall doing ok,  Pt denies chest pain, increasing sob or doe, wheezing, orthopnea, PND, increased LE swelling, palpitations, dizziness or syncope.  Pt denies new neurological symptoms such as new headache, or facial or extremity weakness or numbness.  Pt denies polydipsia, polyuria, or low sugar episode.   Pt denies new neurological symptoms such as new headache, or facial or extremity weakness or numbness.   Pt states overall good compliance with meds, mostly trying to follow appropriate diet, with wt overall stable,  but little exercise however.  Sees renal regularly,  Card CD increased to 360 qd per renal/Dr Lorrene Reid.  Does have occas right neck pain in the AM when sleeping on his side overnight, without radicular symptoms or weakness Past Medical History  Diagnosis Date  . Diabetes mellitus   . Hypertension   . Pneumonia 09/18/2011  . Anemia, unspecified 09/18/2011  . HTN (hypertension) 09/18/2011  . Impaired glucose tolerance 09/18/2011  . Colitis 09/18/2011  . Dementia 09/18/2011  . Alcohol abuse 09/18/2011  . Foot ulcer 09/18/2011  . Allergic rhinitis, cause unspecified 09/20/2011  . Type II or unspecified type diabetes mellitus without mention of complication, uncontrolled 09/20/2011  . Childhood asthma 09/20/2011  . Hyperlipidemia 11/08/2011  . Pre-ulcerative corn or callous 11/20/2011  . Diverticulosis of colon without hemorrhage 02/27/2014   Past Surgical History  Procedure Laterality Date  . Tonsillectomy  1985    reports that he has been smoking Cigarettes.  He has been smoking about 0.50 packs per day. He has never used smokeless tobacco. He reports that he drinks about 1.2 oz of alcohol per week. He reports that he does not use illicit drugs. family history includes Diabetes in his maternal grandfather, mother, and sister; Heart disease in his mother; Prostate cancer in his  paternal grandfather; Transient ischemic attack in his mother. There is no history of Colon cancer. No Known Allergies Current Outpatient Prescriptions on File Prior to Visit  Medication Sig Dispense Refill  . aspirin EC 81 MG tablet Take 1 tablet (81 mg total) by mouth daily. 90 tablet 11  . Blood Glucose Monitoring Suppl (ONE TOUCH ULTRA 2) W/DEVICE KIT Use to check blood sugars daily Dx E11.9 1 each 0  . diltiazem (DILACOR XR) 240 MG 24 hr capsule Take 240 mg by mouth daily.    Marland Kitchen glipiZIDE (GLUCOTROL) 10 MG tablet TAKE ONE TABLET BY MOUTH TWICE DAILY BEFORE MEALS 180 tablet 3  . glucose blood test strip 1 each by Other route 2 (two) times daily. Use to check blood sugars twice a day Dx E11.9 100 each 3  . hydrochlorothiazide (HYDRODIURIL) 25 MG tablet Take 1 tablet (25 mg total) by mouth daily. 90 tablet 3  . Lancets (ONETOUCH ULTRASOFT) lancets 1 each by Other route 2 (two) times daily. Use to check blood sugars twice a day Dx e11.9 100 each 3  . lisinopril (PRINIVIL,ZESTRIL) 20 MG tablet Take 1 tablet (20 mg total) by mouth 2 (two) times daily. 180 tablet 3  . lovastatin (MEVACOR) 40 MG tablet Take 1 tablet (40 mg total) by mouth daily. 90 tablet 3  . metFORMIN (GLUCOPHAGE) 1000 MG tablet Take 1 tablet (1,000 mg total) by mouth 2 (two) times daily with a meal. 90 tablet 3  . Multiple Vitamin (MULTIVITAMIN) tablet Take 1 tablet by mouth daily.    Marland Kitchen  sitaGLIPtin (JANUVIA) 100 MG tablet Take 1 tablet (100 mg total) by mouth daily. 90 tablet 3  . urea (CARMOL) 40 % CREA Apply 1 application topically 2 (two) times daily. 1 each 3   No current facility-administered medications on file prior to visit.     Review of Systems  Constitutional: Negative for unusual diaphoresis or night sweats HENT: Negative for ringing in ear or discharge Eyes: Negative for double vision or worsening visual disturbance.  Respiratory: Negative for choking and stridor.   Gastrointestinal: Negative for vomiting or  other signifcant bowel change Genitourinary: Negative for hematuria or change in urine volume.  Musculoskeletal: Negative for other MSK pain or swelling Skin: Negative for color change and worsening wound.  Neurological: Negative for tremors and numbness other than noted  Psychiatric/Behavioral: Negative for decreased concentration or agitation other than above       Objective:   Physical Exam BP 122/86 mmHg  Pulse 95  Temp(Src) 98.1 F (36.7 C) (Oral)  Ht 6' (1.829 m)  Wt 167 lb (75.751 kg)  BMI 22.64 kg/m2  SpO2 98% VS noted,  Constitutional: Pt appears in no significant distress HENT: Head: NCAT.  Right Ear: External ear normal.  Left Ear: External ear normal.  Eyes: . Pupils are equal, round, and reactive to light. Conjunctivae and EOM are normal Neck: Normal range of motion. Neck supple.  Cardiovascular: Normal rate and regular rhythm.   Pulmonary/Chest: Effort normal and breath sounds without rales or wheezing.  Abd:  Soft, NT, ND, + BS Neurological: Pt is alert. Not confused , motor grossly intact Skin: Skin is warm. No rash, no LE edema Psychiatric: Pt behavior is normal. No agitation.     Assessment & Plan:

## 2015-04-18 ENCOUNTER — Other Ambulatory Visit: Payer: Self-pay | Admitting: Internal Medicine

## 2015-06-24 DIAGNOSIS — F0390 Unspecified dementia without behavioral disturbance: Secondary | ICD-10-CM | POA: Diagnosis not present

## 2015-06-24 DIAGNOSIS — N189 Chronic kidney disease, unspecified: Secondary | ICD-10-CM | POA: Diagnosis not present

## 2015-06-24 DIAGNOSIS — D631 Anemia in chronic kidney disease: Secondary | ICD-10-CM | POA: Diagnosis not present

## 2015-06-24 DIAGNOSIS — I129 Hypertensive chronic kidney disease with stage 1 through stage 4 chronic kidney disease, or unspecified chronic kidney disease: Secondary | ICD-10-CM | POA: Diagnosis not present

## 2015-06-24 DIAGNOSIS — E785 Hyperlipidemia, unspecified: Secondary | ICD-10-CM | POA: Diagnosis not present

## 2015-06-24 DIAGNOSIS — N183 Chronic kidney disease, stage 3 (moderate): Secondary | ICD-10-CM | POA: Diagnosis not present

## 2015-07-30 ENCOUNTER — Other Ambulatory Visit: Payer: Self-pay | Admitting: Internal Medicine

## 2015-08-18 ENCOUNTER — Encounter: Payer: Self-pay | Admitting: Sports Medicine

## 2015-08-18 ENCOUNTER — Ambulatory Visit (INDEPENDENT_AMBULATORY_CARE_PROVIDER_SITE_OTHER): Payer: Medicare Other | Admitting: Sports Medicine

## 2015-08-18 VITALS — BP 165/80 | HR 82 | Resp 18

## 2015-08-18 DIAGNOSIS — E119 Type 2 diabetes mellitus without complications: Secondary | ICD-10-CM

## 2015-08-18 DIAGNOSIS — M79676 Pain in unspecified toe(s): Secondary | ICD-10-CM

## 2015-08-18 DIAGNOSIS — E1142 Type 2 diabetes mellitus with diabetic polyneuropathy: Secondary | ICD-10-CM

## 2015-08-18 DIAGNOSIS — Q828 Other specified congenital malformations of skin: Secondary | ICD-10-CM

## 2015-08-18 DIAGNOSIS — B351 Tinea unguium: Secondary | ICD-10-CM | POA: Diagnosis not present

## 2015-08-18 DIAGNOSIS — L280 Lichen simplex chronicus: Secondary | ICD-10-CM

## 2015-08-18 NOTE — Progress Notes (Signed)
Patient ID: Christopher Lopez, male   DOB: 1961-07-05, 55 y.o.   MRN: [id number] Subjective: Christopher Lopez is a 55 y.o. male patient with history of type2 diabetes who presents to office today complaining of callus and long, painful nails  while ambulating in shoes; unable to trim. Patient states that the glucose reading this morning was not recorded; patient is assisted by brother who states that patient suffers from dementia and its difficult to get brother to check sugar or care for himself. Patient denies any new changes in medication or new problems. Patient denies any new cramping, numbness, burning or tingling in the legs.  Patient Active Problem List   Diagnosis Date Noted  . Left foot drop 03/05/2014  . Diverticulosis of colon without hemorrhage 02/27/2014  . CKD (chronic kidney disease) 02/27/2014  . Left knee pain 02/14/2014  . Nausea alone 02/13/2013  . Early satiety 02/13/2013  . Unspecified constipation 02/13/2013  . Pain of left calf 02/22/2012  . Calf pain, left 11/20/2011  . Pre-ulcerative corn or callous 11/20/2011  . Hyperlipidemia 11/08/2011  . Allergic rhinitis, cause unspecified 09/20/2011  . Diabetes ([place]) 09/20/2011  . Childhood asthma 09/20/2011  . Pneumonia 09/18/2011  . Anemia, unspecified 09/18/2011  . HTN (hypertension) 09/18/2011  . Colitis 09/18/2011  . Preventative health care 09/18/2011  . Dementia 09/18/2011  . Alcohol abuse 09/18/2011  . Foot ulcer ([place]) 09/18/2011   Current Outpatient Prescriptions on File Prior to Visit  Medication Sig Dispense Refill  . aspirin EC 81 MG tablet Take 1 tablet (81 mg total) by mouth daily. 90 tablet 11  . Blood Glucose Monitoring Suppl (ONE TOUCH ULTRA 2) W/DEVICE KIT Use to check blood sugars daily Dx E11.9 1 each 0  . diltiazem (DILACOR XR) 240 MG 24 hr capsule Take 240 mg by mouth daily.    Marland Kitchen glipiZIDE (GLUCOTROL) 10 MG tablet TAKE ONE TABLET BY MOUTH TWICE DAILY BEFORE MEALS 180 tablet 3  . glucose blood test  strip 1 each by Other route 2 (two) times daily. Use to check blood sugars twice a day Dx E11.9 100 each 3  . hydrochlorothiazide (HYDRODIURIL) 25 MG tablet Take 1 tablet by mouth  daily 90 tablet 1  . Lancets (ONETOUCH ULTRASOFT) lancets 1 each by Other route 2 (two) times daily. Use to check blood sugars twice a day Dx e11.9 100 each 3  . lisinopril (PRINIVIL,ZESTRIL) 20 MG tablet Take 1 tablet by mouth two  times daily 180 tablet 1  . lovastatin (MEVACOR) 40 MG tablet Take 1 tablet (40 mg total) by mouth daily. 90 tablet 3  . metFORMIN (GLUCOPHAGE) 1000 MG tablet TAKE 1 TABLET BY MOUTH 2  TIMES DAILY WITH  MEALS 180 tablet 1  . Multiple Vitamin (MULTIVITAMIN) tablet Take 1 tablet by mouth daily.    . sitaGLIPtin (JANUVIA) 100 MG tablet Take 1 tablet (100 mg total) by mouth daily. 90 tablet 3  . TAZTIA XT 360 MG 24 hr capsule     . urea (CARMOL) 40 % CREA Apply 1 application topically 2 (two) times daily. 1 each 3   No current facility-administered medications on file prior to visit.   No Known Allergies   Objective: General: Patient is awake, alert, and oriented x 3 and in no acute distress.  Integument: Skin is warm, severely drybilateral. Nails are tender, severely long, thickened and  dystrophic with subungual debris, consistent with onychomycosis, 1-5 bilateral.  + diffuse callus skin with focal keratotic horn like proliferations  of skin plantar aspects of both feet bilateral sub met 4. No signs of infection. No open lesions present bilateral. Remaining integument unremarkable.  Vasculature:  Dorsalis Pedis pulse 1/4 bilateral. Posterior Tibial pulse  1/4 bilateral.  Capillary fill time <4 sec 1-5 bilateral. No hair growth to the level of the digits. Temperature gradient within normal limits. No varicosities present bilateral. No edema present bilateral.   Neurology: The patient has diminished sensation measured with a 5.07/10g Semmes Weinstein Monofilament at all pedal sites  bilateral . Vibratory sensation diminished bilateral with tuning fork. No Babinski sign present bilateral.   Musculoskeletal: Asymptomatic hammertoe pedal deformities noted bilateral. Muscular strength 5/5 in all lower extremity muscular groups bilateral without pain on range of motion . No tenderness with calf compression bilateral.  Assessment and Plan: Problem List Items Addressed This Visit    None    Visit Diagnoses    Pain due to onychomycosis of toenail    -  Primary    Keratosis lichenoides chronica        Diabetic polyneuropathy associated with type 2 diabetes mellitus ([place])          -Examined patient. -Discussed and educated patient on diabetic foot care, especially with  regards to the vascular, neurological and musculoskeletal systems.  -Stressed the importance of good glycemic control and the detriment of not  controlling glucose levels in relation to the foot. -Mechanically debrided severe horn like keratosis x 2 using sterile blade and all nails 1-5 bilateral using sterile nail nipper and filed with dremel without incident  -Recommend daily skin emollients and vinegar soaks; Will consider derm consult if no improvement -Answered all patient questions -Patient to return  in 3 months for foot care -Patient advised to call the office if any problems or questions arise in the meantime.  Landis Martins, DPM

## 2015-08-22 ENCOUNTER — Encounter: Payer: Self-pay | Admitting: Internal Medicine

## 2015-08-22 ENCOUNTER — Ambulatory Visit (INDEPENDENT_AMBULATORY_CARE_PROVIDER_SITE_OTHER): Payer: Medicare Other | Admitting: Internal Medicine

## 2015-08-22 ENCOUNTER — Other Ambulatory Visit: Payer: Self-pay | Admitting: Internal Medicine

## 2015-08-22 ENCOUNTER — Other Ambulatory Visit (INDEPENDENT_AMBULATORY_CARE_PROVIDER_SITE_OTHER): Payer: Medicare Other

## 2015-08-22 VITALS — BP 142/72 | HR 90 | Temp 98.6°F | Resp 20 | Wt 179.0 lb

## 2015-08-22 DIAGNOSIS — Z1159 Encounter for screening for other viral diseases: Secondary | ICD-10-CM

## 2015-08-22 DIAGNOSIS — E119 Type 2 diabetes mellitus without complications: Secondary | ICD-10-CM

## 2015-08-22 DIAGNOSIS — I10 Essential (primary) hypertension: Secondary | ICD-10-CM | POA: Diagnosis not present

## 2015-08-22 DIAGNOSIS — Z0000 Encounter for general adult medical examination without abnormal findings: Secondary | ICD-10-CM | POA: Diagnosis not present

## 2015-08-22 DIAGNOSIS — N183 Chronic kidney disease, stage 3 (moderate): Secondary | ICD-10-CM | POA: Diagnosis not present

## 2015-08-22 DIAGNOSIS — Z23 Encounter for immunization: Secondary | ICD-10-CM | POA: Diagnosis not present

## 2015-08-22 LAB — URINALYSIS, ROUTINE W REFLEX MICROSCOPIC
BILIRUBIN URINE: NEGATIVE
KETONES UR: NEGATIVE
LEUKOCYTES UA: NEGATIVE
NITRITE: NEGATIVE
PH: 5.5 (ref 5.0–8.0)
Specific Gravity, Urine: 1.025 (ref 1.000–1.030)
URINE GLUCOSE: NEGATIVE
UROBILINOGEN UA: 0.2 (ref 0.0–1.0)

## 2015-08-22 LAB — BASIC METABOLIC PANEL
BUN: 17 mg/dL (ref 6–23)
CALCIUM: 9.9 mg/dL (ref 8.4–10.5)
CHLORIDE: 99 meq/L (ref 96–112)
CO2: 30 mEq/L (ref 19–32)
CREATININE: 1.94 mg/dL — AB (ref 0.40–1.50)
GFR: 46.28 mL/min — ABNORMAL LOW (ref >60.00)
Glucose, Bld: 155 mg/dL — ABNORMAL HIGH (ref 70–99)
Potassium: 4.5 mEq/L (ref 3.5–5.1)
Sodium: 137 mEq/L (ref 135–145)

## 2015-08-22 LAB — HEPATIC FUNCTION PANEL
ALT: 16 U/L (ref 0–53)
AST: 16 U/L (ref 0–37)
Albumin: 4.2 g/dL (ref 3.5–5.2)
Alkaline Phosphatase: 73 U/L (ref 39–117)
BILIRUBIN TOTAL: 0.5 mg/dL (ref 0.2–1.2)
Bilirubin, Direct: 0.1 mg/dL (ref 0.0–0.3)
Total Protein: 8.3 g/dL (ref 6.0–8.3)

## 2015-08-22 LAB — LIPID PANEL
CHOL/HDL RATIO: 4
Cholesterol: 201 mg/dL — ABNORMAL HIGH (ref 0–200)
HDL: 52.6 mg/dL (ref >39.00)
LDL Cholesterol: 118 mg/dL — ABNORMAL HIGH (ref 0–99)
NONHDL: 148.35
Triglycerides: 153 mg/dL — ABNORMAL HIGH (ref 0.0–149.0)
VLDL: 30.6 mg/dL (ref 0.0–40.0)

## 2015-08-22 LAB — MICROALBUMIN / CREATININE URINE RATIO
CREATININE, U: 151.2 mg/dL
MICROALB UR: 319.5 mg/dL — AB (ref 0.0–1.9)
MICROALB/CREAT RATIO: 211.3 mg/g — AB (ref 0.0–30.0)

## 2015-08-22 LAB — HEMOGLOBIN A1C: HEMOGLOBIN A1C: 6.2 % (ref 4.6–6.5)

## 2015-08-22 LAB — PSA: PSA: 0.61 ng/mL (ref 0.10–4.00)

## 2015-08-22 LAB — TSH: TSH: 1.14 u[IU]/mL (ref 0.35–4.50)

## 2015-08-22 NOTE — Progress Notes (Signed)
Subjective:    Patient ID: Christopher Lopez, male    DOB: 1960-08-30, 55 y.o.   MRN: [id number]  HPI  Here for wellness and f/u with wife;  Overall doing ok;  Pt denies Chest pain, worsening SOB, DOE, wheezing, orthopnea, PND, worsening LE edema, palpitations, dizziness or syncope.  Pt denies neurological change such as new headache, facial or extremity weakness.  Pt denies polydipsia, polyuria, or low sugar symptoms. Pt states overall good compliance with treatment and medications, good tolerability, and has been trying to follow appropriate diet.  Pt denies worsening depressive symptoms, suicidal ideation or panic. No fever, night sweats, wt loss, loss of appetite, or other constitutional symptoms.  Pt states good ability with ADL's, has low fall risk, home safety reviewed and adequate, no other significant changes in hearing or vision, and only occasionally active with exercise. Walks with cane. Due for flu shot and pneumonia shots.   Past Medical History  Diagnosis Date  . Diabetes mellitus   . Hypertension   . Pneumonia 09/18/2011  . Anemia, unspecified 09/18/2011  . HTN (hypertension) 09/18/2011  . Impaired glucose tolerance 09/18/2011  . Colitis 09/18/2011  . Dementia 09/18/2011  . Alcohol abuse 09/18/2011  . Foot ulcer ([place]) 09/18/2011  . Allergic rhinitis, cause unspecified 09/20/2011  . Type II or unspecified type diabetes mellitus without mention of complication, uncontrolled 09/20/2011  . Childhood asthma 09/20/2011  . Hyperlipidemia 11/08/2011  . Pre-ulcerative corn or callous 11/20/2011  . Diverticulosis of colon without hemorrhage 02/27/2014   Past Surgical History  Procedure Laterality Date  . Tonsillectomy  1985    reports that he has been smoking Cigarettes.  He has been smoking about 0.50 packs per day. He has never used smokeless tobacco. He reports that he drinks about 1.2 oz of alcohol per week. He reports that he does not use illicit drugs. family history includes Diabetes in his  maternal grandfather, mother, and sister; Heart disease in his mother; Prostate cancer in his paternal grandfather; Transient ischemic attack in his mother. There is no history of Colon cancer. No Known Allergies Current Outpatient Prescriptions on File Prior to Visit  Medication Sig Dispense Refill  . aspirin EC 81 MG tablet Take 1 tablet (81 mg total) by mouth daily. 90 tablet 11  . Blood Glucose Monitoring Suppl (ONE TOUCH ULTRA 2) W/DEVICE KIT Use to check blood sugars daily Dx E11.9 1 each 0  . diltiazem (DILACOR XR) 240 MG 24 hr capsule Take 240 mg by mouth daily.    Marland Kitchen glipiZIDE (GLUCOTROL) 10 MG tablet TAKE ONE TABLET BY MOUTH TWICE DAILY BEFORE MEALS 180 tablet 3  . glucose blood test strip 1 each by Other route 2 (two) times daily. Use to check blood sugars twice a day Dx E11.9 100 each 3  . hydrochlorothiazide (HYDRODIURIL) 25 MG tablet Take 1 tablet by mouth  daily 90 tablet 1  . Lancets (ONETOUCH ULTRASOFT) lancets 1 each by Other route 2 (two) times daily. Use to check blood sugars twice a day Dx e11.9 100 each 3  . lisinopril (PRINIVIL,ZESTRIL) 20 MG tablet Take 1 tablet by mouth two  times daily 180 tablet 1  . lovastatin (MEVACOR) 40 MG tablet Take 1 tablet (40 mg total) by mouth daily. 90 tablet 3  . metFORMIN (GLUCOPHAGE) 1000 MG tablet TAKE 1 TABLET BY MOUTH 2  TIMES DAILY WITH  MEALS 180 tablet 1  . Multiple Vitamin (MULTIVITAMIN) tablet Take 1 tablet by mouth daily.    Marland Kitchen  sitaGLIPtin (JANUVIA) 100 MG tablet Take 1 tablet (100 mg total) by mouth daily. 90 tablet 3  . TAZTIA XT 360 MG 24 hr capsule     . urea (CARMOL) 40 % CREA Apply 1 application topically 2 (two) times daily. 1 each 3   No current facility-administered medications on file prior to visit.    Review of Systems Constitutional: Negative for increased diaphoresis, other activity, appetite or siginficant weight change other than noted HENT: Negative for worsening hearing loss, ear pain, facial swelling, mouth  sores and neck stiffness.   Eyes: Negative for other worsening pain, redness or visual disturbance.  Respiratory: Negative for shortness of breath and wheezing  Cardiovascular: Negative for chest pain and palpitations.  Gastrointestinal: Negative for diarrhea, blood in stool, abdominal distention or other pain Genitourinary: Negative for hematuria, flank pain or change in urine volume.  Musculoskeletal: Negative for myalgias or other joint complaints.  Skin: Negative for color change and wound or drainage.  Neurological: Negative for syncope and numbness. other than noted Hematological: Negative for adenopathy. or other swelling Psychiatric/Behavioral: Negative for hallucinations, SI, self-injury, decreased concentration or other worsening agitation.      Objective:   Physical Exam BP 142/72 mmHg  Pulse 90  Temp(Src) 98.6 F (37 C) (Oral)  Resp 20  Wt 179 lb (81.194 kg)  SpO2 98% VS noted,  Constitutional: Pt is oriented to person, place, and time. Appears well-developed and well-nourished, in no significant distress Head: Normocephalic and atraumatic.  Right Ear: External ear normal.  Left Ear: External ear normal.  Nose: Nose normal.  Mouth/Throat: Oropharynx is clear and moist.  Eyes: Conjunctivae and EOM are normal. Pupils are equal, round, and reactive to light.  Neck: Normal range of motion. Neck supple. No JVD present. No tracheal deviation present or significant neck LA or mass Cardiovascular: Normal rate, regular rhythm, normal heart sounds and intact distal pulses.   Pulmonary/Chest: Effort normal and breath sounds without rales or wheezing  Abdominal: Soft. Bowel sounds are normal. NT. No HSM  Musculoskeletal: Normal range of motion. Exhibits no edema.  Lymphadenopathy:  Has no cervical adenopathy.  Neurological: Pt is alert and oriented to person . Pt has normal reflexes. No cranial nerve deficit. Motor grossly intact Skin: Skin is warm and dry. No rash noted.    Psychiatric:  Has normal mood and affect. Behavior is normal.     Assessment & Plan:

## 2015-08-22 NOTE — Telephone Encounter (Signed)
I have not seen this document  Corrine has it crossed your desk?

## 2015-08-22 NOTE — Patient Instructions (Addendum)
You had the flu shot today, and the Pneumovax shots today  Please continue all other medications as before, and refills have been done if requested.  Please have the pharmacy call with any other refills you may need.  Please continue your efforts at being more active, low cholesterol diet, and weight control.  You are otherwise up to date with prevention measures today.  Please keep your appointments with your specialists as you may have planned  Please go to the LAB in the Basement (turn left off the elevator) for the tests to be done today  You will be contacted by phone if any changes need to be made immediately.  Otherwise, you will receive a letter about your results with an explanation, but please check with MyChart first.  Please remember to sign up for MyChart if you have not done so, as this will be important to you in the future with finding out test results, communicating by private email, and scheduling acute appointments online when needed.  Please return in 6 months, or sooner if needed, with Lab testing done 3-5 days before

## 2015-08-22 NOTE — Telephone Encounter (Signed)
Called pt for the # to the patient assistance program.  Spoke to pt mother. The mother does not have any information.   Do either of you have any information regarding the request for pt assistance

## 2015-08-22 NOTE — Progress Notes (Signed)
Pre visit review using our clinic review tool, if applicable. No additional management support is needed unless otherwise documented below in the visit note. 

## 2015-08-22 NOTE — Telephone Encounter (Signed)
Patient gets Januvia through [employer].  He has been doing this for two years.  He is needing script sent to them.

## 2015-08-23 LAB — CBC WITH DIFFERENTIAL/PLATELET
BASOS ABS: 0.1 10*3/uL (ref 0.0–0.1)
BASOS PCT: 0.5 % (ref 0.0–3.0)
EOS ABS: 0.2 10*3/uL (ref 0.0–0.7)
Eosinophils Relative: 1.7 % (ref 0.0–5.0)
HEMATOCRIT: 42.7 % (ref 39.0–52.0)
HEMOGLOBIN: 13.4 g/dL (ref 13.0–17.0)
LYMPHS PCT: 22.2 % (ref 12.0–46.0)
Lymphs Abs: 2.7 10*3/uL (ref 0.7–4.0)
MCHC: 31.4 g/dL (ref 30.0–36.0)
MCV: 99.1 fl (ref 78.0–100.0)
MONOS PCT: 5.3 % (ref 3.0–12.0)
Monocytes Absolute: 0.6 10*3/uL (ref 0.1–1.0)
Neutro Abs: 8.5 10*3/uL — ABNORMAL HIGH (ref 1.4–7.7)
Neutrophils Relative %: 70.3 % (ref 43.0–77.0)
Platelets: 287 10*3/uL (ref 150.0–400.0)
RBC: 4.31 Mil/uL (ref 4.22–5.81)
RDW: 14.6 % (ref 11.5–15.5)
WBC: 12 10*3/uL — AB (ref 4.0–10.5)

## 2015-08-23 LAB — HEPATITIS C ANTIBODY: HCV AB: NEGATIVE

## 2015-08-24 NOTE — Assessment & Plan Note (Signed)

## 2015-08-24 NOTE — Assessment & Plan Note (Signed)
stable overall by history and exam, recent data reviewed with pt, and pt to continue medical treatment as before,  to f/u any worsening symptoms or concerns Lab Results  Component Value Date   HGBA1C 6.2 08/22/2015

## 2015-08-24 NOTE — Assessment & Plan Note (Signed)
stable overall by history and exam, recent data reviewed with pt, and pt to continue medical treatment as before,  to f/u any worsening symptoms or concerns BP Readings from Last 3 Encounters:  08/22/15 142/72  08/18/15 165/80  02/20/15 122/86

## 2015-08-24 NOTE — Assessment & Plan Note (Signed)
stable overall by history and exam, recent data reviewed with pt, and pt to continue medical treatment as before,  to f/u any worsening symptoms or concerns Lab Results  Component Value Date   CREATININE 1.94* 08/22/2015

## 2015-08-27 MED ORDER — SITAGLIPTIN PHOSPHATE 100 MG PO TABS: 100.0000 mg | ORAL_TABLET | Freq: Every day | ORAL | Status: DC

## 2015-09-17 ENCOUNTER — Other Ambulatory Visit: Payer: Self-pay | Admitting: Internal Medicine

## 2015-10-17 ENCOUNTER — Telehealth: Payer: Self-pay | Admitting: Internal Medicine

## 2015-10-17 DIAGNOSIS — D631 Anemia in chronic kidney disease: Secondary | ICD-10-CM | POA: Diagnosis not present

## 2015-10-17 DIAGNOSIS — E785 Hyperlipidemia, unspecified: Secondary | ICD-10-CM | POA: Diagnosis not present

## 2015-10-17 DIAGNOSIS — I129 Hypertensive chronic kidney disease with stage 1 through stage 4 chronic kidney disease, or unspecified chronic kidney disease: Secondary | ICD-10-CM | POA: Diagnosis not present

## 2015-10-17 DIAGNOSIS — N183 Chronic kidney disease, stage 3 (moderate): Secondary | ICD-10-CM | POA: Diagnosis not present

## 2015-10-17 MED ORDER — METFORMIN HCL 1000 MG PO TABS: ORAL_TABLET | ORAL | Status: DC

## 2015-10-17 NOTE — Telephone Encounter (Signed)
Pt called in and needs refill of  metFORMIN (GLUCOPHAGE) 1000 MG tablet [[id number]]       metFORMIN (GLUCOPHAGE) 1000 MG tablet [id number]     Can this be called in for him today.  He is completely out

## 2015-10-17 NOTE — Telephone Encounter (Signed)
Calling to see which pharmacy to send to since mail order will take a few days

## 2015-10-27 ENCOUNTER — Telehealth: Payer: Self-pay | Admitting: Internal Medicine

## 2015-10-27 NOTE — Telephone Encounter (Signed)
Enid Derry called to advise that she was told that a patient assistance form was going to be sent to [facility] for [place] assistance. States that she has not heard anything at this point and that he is out of the medication. i do not see anything listed about this in that chart. Please review and follow up with shirley

## 2015-10-28 ENCOUNTER — Other Ambulatory Visit: Payer: Self-pay | Admitting: Internal Medicine

## 2015-10-30 ENCOUNTER — Telehealth: Payer: Self-pay | Admitting: Internal Medicine

## 2015-10-30 NOTE — Telephone Encounter (Signed)
[employer] is requesting approval for fill of MEVACOR 90 day supply

## 2015-10-31 NOTE — Telephone Encounter (Signed)
Patient is aware that they need to bring form in for [place] to fill out

## 2015-10-31 NOTE — Telephone Encounter (Signed)
Enid Derry is calling back in regards.  States patient is down to six pills of Januvia.  Can you call mother back in regards at [phone]?

## 2015-11-12 ENCOUNTER — Other Ambulatory Visit: Payer: Self-pay | Admitting: Internal Medicine

## 2015-11-12 MED ORDER — SITAGLIPTIN PHOSPHATE 100 MG PO TABS: 100.0000 mg | ORAL_TABLET | Freq: Every day | ORAL | Status: DC

## 2015-11-12 NOTE — Telephone Encounter (Signed)
Done hardcopy for pt assist program application

## 2015-11-17 ENCOUNTER — Ambulatory Visit: Payer: Medicare Other | Admitting: Sports Medicine

## 2015-11-25 DIAGNOSIS — I129 Hypertensive chronic kidney disease with stage 1 through stage 4 chronic kidney disease, or unspecified chronic kidney disease: Secondary | ICD-10-CM | POA: Diagnosis not present

## 2015-11-25 DIAGNOSIS — E785 Hyperlipidemia, unspecified: Secondary | ICD-10-CM | POA: Diagnosis not present

## 2015-11-25 DIAGNOSIS — N183 Chronic kidney disease, stage 3 (moderate): Secondary | ICD-10-CM | POA: Diagnosis not present

## 2015-11-25 DIAGNOSIS — D631 Anemia in chronic kidney disease: Secondary | ICD-10-CM | POA: Diagnosis not present

## 2015-12-01 ENCOUNTER — Telehealth: Payer: Self-pay | Admitting: *Deleted

## 2015-12-01 ENCOUNTER — Encounter: Payer: Self-pay | Admitting: Sports Medicine

## 2015-12-01 ENCOUNTER — Ambulatory Visit (INDEPENDENT_AMBULATORY_CARE_PROVIDER_SITE_OTHER): Payer: Medicare Other | Admitting: Sports Medicine

## 2015-12-01 DIAGNOSIS — B351 Tinea unguium: Secondary | ICD-10-CM

## 2015-12-01 DIAGNOSIS — Q828 Other specified congenital malformations of skin: Secondary | ICD-10-CM | POA: Diagnosis not present

## 2015-12-01 DIAGNOSIS — R234 Changes in skin texture: Secondary | ICD-10-CM | POA: Diagnosis not present

## 2015-12-01 DIAGNOSIS — L858 Other specified epidermal thickening: Secondary | ICD-10-CM | POA: Diagnosis not present

## 2015-12-01 DIAGNOSIS — E1142 Type 2 diabetes mellitus with diabetic polyneuropathy: Secondary | ICD-10-CM | POA: Diagnosis not present

## 2015-12-01 DIAGNOSIS — L280 Lichen simplex chronicus: Secondary | ICD-10-CM

## 2015-12-01 DIAGNOSIS — M79676 Pain in unspecified toe(s): Secondary | ICD-10-CM

## 2015-12-01 DIAGNOSIS — L818 Other specified disorders of pigmentation: Secondary | ICD-10-CM | POA: Diagnosis not present

## 2015-12-01 NOTE — Progress Notes (Signed)
Patient ID: Christopher Lopez, male   DOB: 22-Feb-1961, 55 y.o.   MRN: [id number]   Subjective: Christopher Lopez is a 55 y.o. male patient with history of type2 diabetes who returns to office today complaining of callus and long, painful nails while ambulating in shoes; unable to trim. Patient states that the glucose reading this morning was not recorded; patient is assisted by brother. Patient denies any new changes in medication or new problems. Patient denies any new cramping, numbness, burning or tingling in the legs.  Patient Active Problem List   Diagnosis Date Noted  . Left foot drop 03/05/2014  . Diverticulosis of colon without hemorrhage 02/27/2014  . CKD (chronic kidney disease) 02/27/2014  . Left knee pain 02/14/2014  . Nausea alone 02/13/2013  . Early satiety 02/13/2013  . Unspecified constipation 02/13/2013  . Pain of left calf 02/22/2012  . Calf pain, left 11/20/2011  . Pre-ulcerative corn or callous 11/20/2011  . Hyperlipidemia 11/08/2011  . Allergic rhinitis, cause unspecified 09/20/2011  . Diabetes ([place]) 09/20/2011  . Childhood asthma 09/20/2011  . Pneumonia 09/18/2011  . Anemia, unspecified 09/18/2011  . HTN (hypertension) 09/18/2011  . Colitis 09/18/2011  . Preventative health care 09/18/2011  . Dementia 09/18/2011  . Alcohol abuse 09/18/2011  . Foot ulcer ([place]) 09/18/2011   Current Outpatient Prescriptions on File Prior to Visit  Medication Sig Dispense Refill  . aspirin EC 81 MG tablet Take 1 tablet (81 mg total) by mouth daily. 90 tablet 11  . Blood Glucose Monitoring Suppl (ONE TOUCH ULTRA 2) W/DEVICE KIT Use to check blood sugars daily Dx E11.9 1 each 0  . diltiazem (DILACOR XR) 240 MG 24 hr capsule Take 240 mg by mouth daily.    Marland Kitchen glipiZIDE (GLUCOTROL) 10 MG tablet TAKE ONE TABLET BY MOUTH  TWICE DAILY BEFORE MEALS 180 tablet 0  . glucose blood test strip 1 each by Other route 2 (two) times daily. Use to check blood sugars twice a day Dx E11.9 100 each 3  .  hydrochlorothiazide (HYDRODIURIL) 25 MG tablet Take 1 tablet by mouth  daily 90 tablet 1  . Lancets (ONETOUCH ULTRASOFT) lancets 1 each by Other route 2 (two) times daily. Use to check blood sugars twice a day Dx e11.9 100 each 3  . lisinopril (PRINIVIL,ZESTRIL) 20 MG tablet Take 1 tablet by mouth two  times daily 180 tablet 1  . lovastatin (MEVACOR) 40 MG tablet Take 1 tablet by mouth  daily 90 tablet 0  . metFORMIN (GLUCOPHAGE) 1000 MG tablet TAKE 1 TABLET BY MOUTH 2  TIMES DAILY WITH  MEALS 180 tablet 1  . Multiple Vitamin (MULTIVITAMIN) tablet Take 1 tablet by mouth daily.    . sitaGLIPtin (JANUVIA) 100 MG tablet Take 1 tablet (100 mg total) by mouth daily. 90 tablet 3  . TAZTIA XT 360 MG 24 hr capsule     . urea (CARMOL) 40 % CREA Apply 1 application topically 2 (two) times daily. 1 each 3   No current facility-administered medications on file prior to visit.   No Known Allergies   Objective: General: Patient is awake, alert, and oriented x 3 and in no acute distress.  Integument: Skin is warm, severely drybilateral. Nails are tender, severely long, thickened and  dystrophic with subungual debris, consistent with onychomycosis, 1-5 bilateral.  + diffuse hyperpigmentation in scaly plaques with callus skin with focal keratotic horn like proliferations of skin plantar aspects of both feet bilateral sub met 4. No signs of infection.  No open lesions present bilateral. Remaining integument unremarkable.  Vasculature:  Dorsalis Pedis pulse 1/4 bilateral. Posterior Tibial pulse  1/4 bilateral.  Capillary fill time <4 sec 1-5 bilateral. No hair growth to the level of the digits. Temperature gradient within normal limits. No varicosities present bilateral. No edema present bilateral.   Neurology: The patient has diminished sensation measured with a 5.07/10g Semmes Weinstein Monofilament at all pedal sites bilateral . Vibratory sensation diminished bilateral with tuning fork. No Babinski sign  present bilateral.   Musculoskeletal: Asymptomatic hammertoe pedal deformities noted bilateral. Muscular strength 5/5 in all lower extremity muscular groups bilateral without pain on range of motion . No tenderness with calf compression bilateral.  Assessment and Plan: Problem List Items Addressed This Visit    None    Visit Diagnoses    Pain due to onychomycosis of toenail    -  Primary    Keratosis lichenoides chronica        Relevant Orders    Fungus Culture with Smear    Diabetic polyneuropathy associated with type 2 diabetes mellitus (HCC)        Porokeratosis        Dermatophytosis of nail          -Examined patient. -Discussed and educated patient on diabetic foot care, especially with  regards to the vascular, neurological and musculoskeletal systems.  -Stressed the importance of good glycemic control and the detriment of not  controlling glucose levels in relation to the foot. -Mechanically debrided severe horn like keratosis x 2 using sterile blade and all nails 1-5 bilateral using sterile nail nipper and filed with dremel without incident  -Keratin sample of skin obtained and sent to [place] placed as well due to severity of skin and nail involvement  -Recommend daily skin emollients and vinegar soaks -Answered all patient questions -Patient to return  in 3 months for foot care -Patient advised to call the office if any problems or questions arise in the meantime.  Landis Martins, DPM

## 2015-12-01 NOTE — Telephone Encounter (Signed)
Patient Assistance application required MD Licence number and additional information from patient.  Message left with pt's mother Enid Derry informing her that application has been placed in cabinet for patient pick up

## 2015-12-01 NOTE — Telephone Encounter (Addendum)
-----   Message from Landis Martins, [place] sent at 12/01/2015  1:49 PM EDT ----- Regarding: Derm Consult Derm Consult Bilateral lower extremity rash black with flakes suggestive of lichen chronica Thanks Dr. Cannon Kettle.  Appt with [facility] - Dr. Pablo Ledger 01/16/2016 at #30pm arrive 300pm.  Appt called to pt. 12/02/2015-Faxed referral, pt clinical and demographic to [facility].

## 2016-01-09 ENCOUNTER — Ambulatory Visit (INDEPENDENT_AMBULATORY_CARE_PROVIDER_SITE_OTHER): Payer: Medicare Other | Admitting: Internal Medicine

## 2016-01-09 ENCOUNTER — Encounter: Payer: Self-pay | Admitting: Internal Medicine

## 2016-01-09 ENCOUNTER — Other Ambulatory Visit (INDEPENDENT_AMBULATORY_CARE_PROVIDER_SITE_OTHER): Payer: Medicare Other

## 2016-01-09 VITALS — BP 126/80 | HR 105 | Temp 98.0°F | Resp 20 | Wt 165.0 lb

## 2016-01-09 DIAGNOSIS — I10 Essential (primary) hypertension: Secondary | ICD-10-CM

## 2016-01-09 DIAGNOSIS — E119 Type 2 diabetes mellitus without complications: Secondary | ICD-10-CM

## 2016-01-09 DIAGNOSIS — R6889 Other general symptoms and signs: Secondary | ICD-10-CM

## 2016-01-09 DIAGNOSIS — Z0001 Encounter for general adult medical examination with abnormal findings: Secondary | ICD-10-CM

## 2016-01-09 DIAGNOSIS — F0390 Unspecified dementia without behavioral disturbance: Secondary | ICD-10-CM

## 2016-01-09 DIAGNOSIS — N183 Chronic kidney disease, stage 3 (moderate): Secondary | ICD-10-CM | POA: Diagnosis not present

## 2016-01-09 LAB — BASIC METABOLIC PANEL
BUN: 28 mg/dL — AB (ref 6–23)
CALCIUM: 10.1 mg/dL (ref 8.4–10.5)
CO2: 26 mEq/L (ref 19–32)
Chloride: 103 mEq/L (ref 96–112)
Creatinine, Ser: 2.11 mg/dL — ABNORMAL HIGH (ref 0.40–1.50)
GFR: 41.94 mL/min — AB (ref >60.00)
GLUCOSE: 102 mg/dL — AB (ref 70–99)
Potassium: 4.5 mEq/L (ref 3.5–5.1)
SODIUM: 138 meq/L (ref 135–145)

## 2016-01-09 LAB — HEPATIC FUNCTION PANEL
ALBUMIN: 4.5 g/dL (ref 3.5–5.2)
ALK PHOS: 62 U/L (ref 39–117)
ALT: 12 U/L (ref 0–53)
AST: 12 U/L (ref 0–37)
Bilirubin, Direct: 0.1 mg/dL (ref 0.0–0.3)
Total Bilirubin: 0.4 mg/dL (ref 0.2–1.2)
Total Protein: 8.4 g/dL — ABNORMAL HIGH (ref 6.0–8.3)

## 2016-01-09 LAB — LIPID PANEL
CHOLESTEROL: 160 mg/dL (ref 0–200)
HDL: 38.2 mg/dL — ABNORMAL LOW (ref >39.00)
LDL Cholesterol: 83 mg/dL (ref 0–99)
NonHDL: 121.87
Total CHOL/HDL Ratio: 4
Triglycerides: 193 mg/dL — ABNORMAL HIGH (ref 0.0–149.0)
VLDL: 38.6 mg/dL (ref 0.0–40.0)

## 2016-01-09 LAB — CBC WITH DIFFERENTIAL/PLATELET
Basophils Absolute: 0 10*3/uL (ref 0.0–0.1)
Basophils Relative: 0.2 % (ref 0.0–3.0)
EOS PCT: 1.6 % (ref 0.0–5.0)
Eosinophils Absolute: 0.2 10*3/uL (ref 0.0–0.7)
HEMATOCRIT: 37.4 % — AB (ref 39.0–52.0)
HEMOGLOBIN: 12.7 g/dL — AB (ref 13.0–17.0)
Lymphocytes Relative: 13.3 % (ref 12.0–46.0)
Lymphs Abs: 1.8 10*3/uL (ref 0.7–4.0)
MCHC: 33.9 g/dL (ref 30.0–36.0)
MCV: 93.8 fl (ref 78.0–100.0)
MONOS PCT: 4.5 % (ref 3.0–12.0)
Monocytes Absolute: 0.6 10*3/uL (ref 0.1–1.0)
Neutro Abs: 11.1 10*3/uL — ABNORMAL HIGH (ref 1.4–7.7)
Neutrophils Relative %: 80.4 % — ABNORMAL HIGH (ref 43.0–77.0)
Platelets: 369 10*3/uL (ref 150.0–400.0)
RBC: 3.99 Mil/uL — AB (ref 4.22–5.81)
RDW: 14.4 % (ref 11.5–15.5)
WBC: 13.8 10*3/uL — AB (ref 4.0–10.5)

## 2016-01-09 LAB — TSH: TSH: 1.35 u[IU]/mL (ref 0.35–4.50)

## 2016-01-09 LAB — HEMOGLOBIN A1C: Hgb A1c MFr Bld: 5.8 % (ref 4.6–6.5)

## 2016-01-09 LAB — PSA: PSA: 0.67 ng/mL (ref 0.10–4.00)

## 2016-01-09 NOTE — Assessment & Plan Note (Signed)
stable overall by history and exam, recent data reviewed with pt, and pt to continue medical treatment as before,  to f/u any worsening symptoms or concerns BP Readings from Last 3 Encounters:  01/09/16 126/80  08/22/15 142/72  08/18/15 165/80

## 2016-01-09 NOTE — Assessment & Plan Note (Signed)
stable overall by history and exam, recent data reviewed with pt, and pt to continue medical treatment as before,  to f/u any worsening symptoms or concerns Lab Results  Component Value Date   CREATININE 2.11* 01/09/2016  for f/u nephrology as planned

## 2016-01-09 NOTE — Progress Notes (Signed)
Subjective:    Patient ID: Christopher Lopez, male    DOB: 1961/03/13, 55 y.o.   MRN: [id number]  HPI  Here to f/u; overall doing ok,  Pt denies chest pain, increasing sob or doe, wheezing, orthopnea, PND, increased LE swelling, palpitations, dizziness or syncope.  Pt denies new neurological symptoms such as new headache, or facial or extremity weakness or numbness.  Pt denies polydipsia, polyuria, or low sugar episode.   Pt denies new neurological symptoms such as new headache, or facial or extremity weakness or numbness.   Pt states overall good compliance with meds, mostly trying to follow appropriate diet, with wt overall stable,  but little exercise however. Trying to apply for medicaid, and possibly assissted living. Dementia overall stable symptomatically with gradual worsening at best, and not assoc with behavioral changes such as hallucinations, paranoia, or agitation. Past Medical History  Diagnosis Date  . Diabetes mellitus   . Hypertension   . Pneumonia 09/18/2011  . Anemia, unspecified 09/18/2011  . HTN (hypertension) 09/18/2011  . Impaired glucose tolerance 09/18/2011  . Colitis 09/18/2011  . Dementia 09/18/2011  . Alcohol abuse 09/18/2011  . Foot ulcer ([place]) 09/18/2011  . Allergic rhinitis, cause unspecified 09/20/2011  . Type II or unspecified type diabetes mellitus without mention of complication, uncontrolled 09/20/2011  . Childhood asthma 09/20/2011  . Hyperlipidemia 11/08/2011  . Pre-ulcerative corn or callous 11/20/2011  . Diverticulosis of colon without hemorrhage 02/27/2014   Past Surgical History  Procedure Laterality Date  . Tonsillectomy  1985    reports that he has been smoking Cigarettes.  He has been smoking about 0.50 packs per day. He has never used smokeless tobacco. He reports that he drinks about 1.2 oz of alcohol per week. He reports that he does not use illicit drugs. family history includes Diabetes in his maternal grandfather, mother, and sister; Heart disease in his  mother; Prostate cancer in his paternal grandfather; Transient ischemic attack in his mother. There is no history of Colon cancer. No Known Allergies Current Outpatient Prescriptions on File Prior to Visit  Medication Sig Dispense Refill  . aspirin EC 81 MG tablet Take 1 tablet (81 mg total) by mouth daily. 90 tablet 11  . Blood Glucose Monitoring Suppl (ONE TOUCH ULTRA 2) W/DEVICE KIT Use to check blood sugars daily Dx E11.9 1 each 0  . diltiazem (DILACOR XR) 240 MG 24 hr capsule Take 240 mg by mouth daily.    Marland Kitchen glipiZIDE (GLUCOTROL) 10 MG tablet TAKE ONE TABLET BY MOUTH  TWICE DAILY BEFORE MEALS 180 tablet 0  . glucose blood test strip 1 each by Other route 2 (two) times daily. Use to check blood sugars twice a day Dx E11.9 100 each 3  . hydrochlorothiazide (HYDRODIURIL) 25 MG tablet Take 1 tablet by mouth  daily 90 tablet 1  . Lancets (ONETOUCH ULTRASOFT) lancets 1 each by Other route 2 (two) times daily. Use to check blood sugars twice a day Dx e11.9 100 each 3  . lisinopril (PRINIVIL,ZESTRIL) 20 MG tablet Take 1 tablet by mouth two  times daily 180 tablet 1  . lovastatin (MEVACOR) 40 MG tablet Take 1 tablet by mouth  daily 90 tablet 0  . metFORMIN (GLUCOPHAGE) 1000 MG tablet TAKE 1 TABLET BY MOUTH 2  TIMES DAILY WITH  MEALS 180 tablet 1  . Multiple Vitamin (MULTIVITAMIN) tablet Take 1 tablet by mouth daily.    . sitaGLIPtin (JANUVIA) 100 MG tablet Take 1 tablet (100 mg total) by  mouth daily. 90 tablet 3  . TAZTIA XT 360 MG 24 hr capsule     . urea (CARMOL) 40 % CREA Apply 1 application topically 2 (two) times daily. 1 each 3   No current facility-administered medications on file prior to visit.   Review of Systems - limited due to dementia, mother helps  Constitutional: Negative for unusual diaphoresis or night sweats HENT: Negative for ear swelling or discharge Eyes: Negative for worsening visual haziness  Respiratory: Negative for choking and stridor.   Gastrointestinal: Negative  for distension or worsening eructation Genitourinary: Negative for retention or change in urine volume.  Musculoskeletal: Negative for other MSK pain or swelling Skin: Negative for color change and worsening wound Neurological: Negative for tremors and numbness other than noted  Psychiatric/Behavioral: Negative for decreased concentration or agitation other than above       Objective:   Physical Exam BP 126/80 mmHg  Pulse 105  Temp(Src) 98 F (36.7 C) (Oral)  Resp 20  Wt 165 lb (74.844 kg)  SpO2 98% VS noted,  Constitutional: Pt appears in no apparent distress HENT: Head: NCAT.  Right Ear: External ear normal.  Left Ear: External ear normal.  Eyes: . Pupils are equal, round, and reactive to light. Conjunctivae and EOM are normal Neck: Normal range of motion. Neck supple.  Cardiovascular: Normal rate and regular rhythm.   Pulmonary/Chest: Effort normal and breath sounds without rales or wheezing.  Abd:  Soft, NT, ND, + BS Neurological: Pt is alert. At baseline confused , motor grossly intact Skin: Skin is warm. No rash, no LE edema Psychiatric: Pt behavior is normal. No agitation.     Assessment & Plan:

## 2016-01-09 NOTE — Progress Notes (Signed)
Pre visit review using our clinic review tool, if applicable. No additional management support is needed unless otherwise documented below in the visit note. 

## 2016-01-09 NOTE — Assessment & Plan Note (Signed)
stable overall by history and exam, recent data reviewed with pt, and pt to continue medical treatment as before,  to f/u any worsening symptoms or concerns Lab Results  Component Value Date   HGBA1C 5.8 01/09/2016

## 2016-01-09 NOTE — Assessment & Plan Note (Signed)
stable overall by history and exam, recent data reviewed with pt, and pt to continue medical treatment as before,  to f/u any worsening symptoms or concerns, forms to be completed related to [facility] application, to help with transition to assisted living

## 2016-01-09 NOTE — Patient Instructions (Signed)
Your forms were partially filled out today; the rest will be given the [facility] to help finish the forms  Please continue all other medications as before, and refills have been done if requested.  Please have the pharmacy call with any other refills you may need.  Please continue your efforts at being more active, low cholesterol diet, and weight control.  You are otherwise up to date with prevention measures today.  Please keep your appointments with your specialists as you may have planned  Please go to the LAB in the Basement (turn left off the elevator) for the tests to be done today  You will be contacted by phone if any changes need to be made immediately.  Otherwise, you will receive a letter about your results with an explanation, but please check with MyChart first.  Please remember to sign up for MyChart if you have not done so, as this will be important to you in the future with finding out test results, communicating by private email, and scheduling acute appointments online when needed.  Please return in Jan 2018, or sooner if needed, with Lab testing done 3-5 days before

## 2016-01-12 ENCOUNTER — Encounter: Payer: Self-pay | Admitting: Sports Medicine

## 2016-01-14 ENCOUNTER — Telehealth: Payer: Self-pay

## 2016-01-14 NOTE — Telephone Encounter (Signed)
[facility] Long Term and Adult Care form received, completed,and placed on MD's desk for signature

## 2016-01-16 NOTE — Telephone Encounter (Signed)
Paperwork signed, copy sent to scan, original mailed per pt's mother's request

## 2016-01-20 DIAGNOSIS — B351 Tinea unguium: Secondary | ICD-10-CM | POA: Diagnosis not present

## 2016-01-20 DIAGNOSIS — B353 Tinea pedis: Secondary | ICD-10-CM | POA: Diagnosis not present

## 2016-01-23 ENCOUNTER — Telehealth: Payer: Self-pay | Admitting: Internal Medicine

## 2016-01-23 NOTE — Telephone Encounter (Signed)
Pt wife called and said that [facility] is needing the last labs for this pt.  He is needing to give me a medication today.    Fax number [phone]

## 2016-01-23 NOTE — Telephone Encounter (Signed)
Labs faxed

## 2016-02-19 ENCOUNTER — Ambulatory Visit: Payer: Medicare Other | Admitting: Internal Medicine

## 2016-02-23 DIAGNOSIS — B353 Tinea pedis: Secondary | ICD-10-CM | POA: Diagnosis not present

## 2016-02-24 ENCOUNTER — Ambulatory Visit: Payer: Medicare Other | Admitting: Internal Medicine

## 2016-03-08 ENCOUNTER — Ambulatory Visit (INDEPENDENT_AMBULATORY_CARE_PROVIDER_SITE_OTHER): Payer: Medicare Other | Admitting: Sports Medicine

## 2016-03-08 ENCOUNTER — Encounter: Payer: Self-pay | Admitting: Sports Medicine

## 2016-03-08 DIAGNOSIS — Q828 Other specified congenital malformations of skin: Secondary | ICD-10-CM

## 2016-03-08 DIAGNOSIS — M79676 Pain in unspecified toe(s): Secondary | ICD-10-CM

## 2016-03-08 DIAGNOSIS — E1142 Type 2 diabetes mellitus with diabetic polyneuropathy: Secondary | ICD-10-CM

## 2016-03-08 DIAGNOSIS — L280 Lichen simplex chronicus: Secondary | ICD-10-CM

## 2016-03-08 DIAGNOSIS — B351 Tinea unguium: Secondary | ICD-10-CM | POA: Diagnosis not present

## 2016-03-08 NOTE — Progress Notes (Signed)
Patient ID: TEDFORD BERG, male   DOB: January 09, 1961, 55 y.o.   MRN: [id number]   Subjective: Christopher Lopez is a 55 y.o. male patient with history of type2 diabetes who returns to office today complaining of callus and long, painful nails while ambulating in shoes; unable to trim. Patient states that the glucose reading this morning was not recorded; patient is assisted by brother who reports that he fails to soak his feet and uses cream from dermatologist but does not wash or shower. Patient denies any new changes in medication or new problems. Patient denies any new cramping, numbness, burning or tingling in the legs.  Patient Active Problem List   Diagnosis Date Noted  . Left foot drop 03/05/2014  . Diverticulosis of colon without hemorrhage 02/27/2014  . CKD (chronic kidney disease) 02/27/2014  . Left knee pain 02/14/2014  . Nausea alone 02/13/2013  . Early satiety 02/13/2013  . Unspecified constipation 02/13/2013  . Pain of left calf 02/22/2012  . Calf pain, left 11/20/2011  . Pre-ulcerative corn or callous 11/20/2011  . Hyperlipidemia 11/08/2011  . Allergic rhinitis, cause unspecified 09/20/2011  . Diabetes ([place]) 09/20/2011  . Childhood asthma 09/20/2011  . Pneumonia 09/18/2011  . Anemia, unspecified 09/18/2011  . HTN (hypertension) 09/18/2011  . Colitis 09/18/2011  . Preventative health care 09/18/2011  . Dementia 09/18/2011  . Alcohol abuse 09/18/2011  . Foot ulcer ([place]) 09/18/2011   Current Outpatient Prescriptions on File Prior to Visit  Medication Sig Dispense Refill  . aspirin EC 81 MG tablet Take 1 tablet (81 mg total) by mouth daily. 90 tablet 11  . Blood Glucose Monitoring Suppl (ONE TOUCH ULTRA 2) W/DEVICE KIT Use to check blood sugars daily Dx E11.9 1 each 0  . diltiazem (DILACOR XR) 240 MG 24 hr capsule Take 240 mg by mouth daily.    Marland Kitchen glipiZIDE (GLUCOTROL) 10 MG tablet TAKE ONE TABLET BY MOUTH  TWICE DAILY BEFORE MEALS 180 tablet 0  . glucose blood test strip 1  each by Other route 2 (two) times daily. Use to check blood sugars twice a day Dx E11.9 100 each 3  . hydrochlorothiazide (HYDRODIURIL) 25 MG tablet Take 1 tablet by mouth  daily 90 tablet 1  . Lancets (ONETOUCH ULTRASOFT) lancets 1 each by Other route 2 (two) times daily. Use to check blood sugars twice a day Dx e11.9 100 each 3  . lisinopril (PRINIVIL,ZESTRIL) 20 MG tablet Take 1 tablet by mouth two  times daily 180 tablet 1  . lovastatin (MEVACOR) 40 MG tablet Take 1 tablet by mouth  daily 90 tablet 0  . metFORMIN (GLUCOPHAGE) 1000 MG tablet TAKE 1 TABLET BY MOUTH 2  TIMES DAILY WITH  MEALS 180 tablet 1  . Multiple Vitamin (MULTIVITAMIN) tablet Take 1 tablet by mouth daily.    . sitaGLIPtin (JANUVIA) 100 MG tablet Take 1 tablet (100 mg total) by mouth daily. 90 tablet 3  . TAZTIA XT 360 MG 24 hr capsule     . urea (CARMOL) 40 % CREA Apply 1 application topically 2 (two) times daily. 1 each 3   No current facility-administered medications on file prior to visit.    No Known Allergies   Objective: General: Patient is awake, alert, and oriented x 3 and in no acute distress.  Integument: Skin is warm, severely dry bilateral. Nails are tender, severely long, thickened and dystrophic with subungual debris, consistent with onychomycosis, 1-5 bilateral.  + diffuse hyperpigmentation in scaly plaques with callus skin  with focal keratotic horn like proliferations of skin plantar aspects of both feet bilateral sub met 4. No signs of infection. No open lesions present bilateral. Remaining integument unremarkable.  Vasculature:  Dorsalis Pedis pulse 1/4 bilateral. Posterior Tibial pulse  1/4 bilateral. Capillary fill time <4 sec 1-5 bilateral. No hair growth to the level of the digits.Temperature gradient within normal limits. No varicosities present bilateral. No edema present bilateral.   Neurology: The patient has diminished sensation measured with a 5.07/10g Semmes Weinstein Monofilament at all  pedal sites bilateral . Vibratory sensation diminished bilateral with tuning fork. No Babinski sign present bilateral.   Musculoskeletal: Asymptomatic hammertoe pedal deformities noted bilateral. Muscular strength 5/5 in all lower extremity muscular groups bilateral without pain on range of motion . No tenderness with calf compression bilateral.  Assessment and Plan: Problem List Items Addressed This Visit    None    Visit Diagnoses    Keratosis lichenoides chronica    -  Primary   Pain due to onychomycosis of toenail       Diabetic polyneuropathy associated with type 2 diabetes mellitus ([place])       Porokeratosis         -Examined patient. -Discussed and educated patient on diabetic foot care, especially with  regards to the vascular, neurological and musculoskeletal systems.  -Stressed the importance of good glycemic control and the detriment of not  controlling glucose levels in relation to the foot. -Mechanically debrided severe horn like keratosis x 2 using sterile blade and all nails 1-5 bilateral using sterile nail nipper and filed with dremel without incident  -Continue with creams recommended by dermatologist; May consider Fluconazole  -Recommend daily skin emollients and vinegar soaks -Recommend good hygiene habits -Answered all patient questions -Patient to return  in 3 months for foot care -Patient advised to call the office if any problems or questions arise in the meantime.  Landis Martins, DPM

## 2016-03-10 DIAGNOSIS — N183 Chronic kidney disease, stage 3 (moderate): Secondary | ICD-10-CM | POA: Diagnosis not present

## 2016-03-10 DIAGNOSIS — D631 Anemia in chronic kidney disease: Secondary | ICD-10-CM | POA: Diagnosis not present

## 2016-03-10 DIAGNOSIS — E1129 Type 2 diabetes mellitus with other diabetic kidney complication: Secondary | ICD-10-CM | POA: Diagnosis not present

## 2016-03-10 DIAGNOSIS — I129 Hypertensive chronic kidney disease with stage 1 through stage 4 chronic kidney disease, or unspecified chronic kidney disease: Secondary | ICD-10-CM | POA: Diagnosis not present

## 2016-04-13 ENCOUNTER — Other Ambulatory Visit: Payer: Self-pay | Admitting: *Deleted

## 2016-04-13 MED ORDER — SITAGLIPTIN PHOSPHATE 100 MG PO TABS: 100.0000 mg | ORAL_TABLET | Freq: Every day | ORAL | 1 refills | Status: DC

## 2016-04-13 MED ORDER — LOVASTATIN 40 MG PO TABS: 40.0000 mg | ORAL_TABLET | Freq: Every day | ORAL | 1 refills | Status: DC

## 2016-04-13 MED ORDER — LISINOPRIL 20 MG PO TABS: 20.0000 mg | ORAL_TABLET | Freq: Two times a day (BID) | ORAL | 1 refills | Status: DC

## 2016-04-13 MED ORDER — GLIPIZIDE 10 MG PO TABS: ORAL_TABLET | ORAL | 1 refills | Status: DC

## 2016-04-13 MED ORDER — METFORMIN HCL 1000 MG PO TABS: ORAL_TABLET | ORAL | 1 refills | Status: DC

## 2016-04-13 MED ORDER — HYDROCHLOROTHIAZIDE 25 MG PO TABS: 25.0000 mg | ORAL_TABLET | Freq: Every day | ORAL | 1 refills | Status: DC

## 2016-04-13 NOTE — Telephone Encounter (Signed)
Pt mom left msg on triage stating [facility] has fax over refill request for medication last week, and they have not receive response back. Per chanrt no request has came electronically. Sent all pt maintenance meds to [facility]...Johny Chess

## 2016-05-17 DIAGNOSIS — B351 Tinea unguium: Secondary | ICD-10-CM | POA: Diagnosis not present

## 2016-05-17 DIAGNOSIS — Z79899 Other long term (current) drug therapy: Secondary | ICD-10-CM | POA: Diagnosis not present

## 2016-05-17 DIAGNOSIS — B353 Tinea pedis: Secondary | ICD-10-CM | POA: Diagnosis not present

## 2016-06-15 ENCOUNTER — Encounter: Payer: Self-pay | Admitting: Sports Medicine

## 2016-06-15 ENCOUNTER — Ambulatory Visit (INDEPENDENT_AMBULATORY_CARE_PROVIDER_SITE_OTHER): Payer: Medicare Other | Admitting: Sports Medicine

## 2016-06-15 DIAGNOSIS — E1142 Type 2 diabetes mellitus with diabetic polyneuropathy: Secondary | ICD-10-CM

## 2016-06-15 DIAGNOSIS — Q828 Other specified congenital malformations of skin: Secondary | ICD-10-CM

## 2016-06-15 DIAGNOSIS — B351 Tinea unguium: Secondary | ICD-10-CM

## 2016-06-15 DIAGNOSIS — M79676 Pain in unspecified toe(s): Secondary | ICD-10-CM

## 2016-06-15 DIAGNOSIS — L280 Lichen simplex chronicus: Secondary | ICD-10-CM

## 2016-06-15 NOTE — Progress Notes (Signed)
Patient ID: Christopher Lopez, male   DOB: Jun 18, 1961, 55 y.o.   MRN: [id number]   Subjective: Christopher Lopez is a 55 y.o. male patient Lopez history of type2 diabetes who returns to office today complaining of callus and long, painful nails while ambulating in shoes; unable to trim. Patient states that the glucose reading this morning was not recorded; patient is assisted by brother who reports that he has been more compliant Lopez soaking and apply skin cream. Reports has nephrology appt tomorrow. Patient denies any new changes in medication or new problems. Patient denies any new cramping, numbness, burning or tingling in the legs.  Patient Active Problem List   Diagnosis Date Noted  . Left foot drop 03/05/2014  . Diverticulosis of colon without hemorrhage 02/27/2014  . CKD (chronic kidney disease) 02/27/2014  . Left knee pain 02/14/2014  . Nausea alone 02/13/2013  . Early satiety 02/13/2013  . Unspecified constipation 02/13/2013  . Pain of left calf 02/22/2012  . Calf pain, left 11/20/2011  . Pre-ulcerative corn or callous 11/20/2011  . Hyperlipidemia 11/08/2011  . Allergic rhinitis, cause unspecified 09/20/2011  . Diabetes ([place]) 09/20/2011  . Childhood asthma 09/20/2011  . Pneumonia 09/18/2011  . Anemia, unspecified 09/18/2011  . HTN (hypertension) 09/18/2011  . Colitis 09/18/2011  . Preventative health care 09/18/2011  . Dementia 09/18/2011  . Alcohol abuse 09/18/2011  . Foot ulcer ([place]) 09/18/2011   Current Outpatient Prescriptions on File Prior to Visit  Medication Sig Dispense Refill  . aspirin EC 81 MG tablet Take 1 tablet (81 mg total) by mouth daily. 90 tablet 11  . Blood Glucose Monitoring Suppl (ONE TOUCH ULTRA 2) W/DEVICE KIT Use to check blood sugars daily Dx E11.9 1 each 0  . diltiazem (DILACOR XR) 240 MG 24 hr capsule Take 240 mg by mouth daily.    Marland Kitchen glipiZIDE (GLUCOTROL) 10 MG tablet TAKE ONE TABLET BY MOUTH  TWICE DAILY BEFORE MEALS 180 tablet 1  . glucose blood  test strip 1 each by Other route 2 (two) times daily. Use to check blood sugars twice a day Dx E11.9 100 each 3  . hydrochlorothiazide (HYDRODIURIL) 25 MG tablet Take 1 tablet (25 mg total) by mouth daily. 90 tablet 1  . Lancets (ONETOUCH ULTRASOFT) lancets 1 each by Other route 2 (two) times daily. Use to check blood sugars twice a day Dx e11.9 100 each 3  . lisinopril (PRINIVIL,ZESTRIL) 20 MG tablet Take 1 tablet (20 mg total) by mouth 2 (two) times daily. 180 tablet 1  . lovastatin (MEVACOR) 40 MG tablet Take 1 tablet (40 mg total) by mouth daily. 90 tablet 1  . metFORMIN (GLUCOPHAGE) 1000 MG tablet TAKE 1 TABLET BY MOUTH 2  TIMES DAILY Lopez  MEALS 180 tablet 1  . Multiple Vitamin (MULTIVITAMIN) tablet Take 1 tablet by mouth daily.    . sitaGLIPtin (JANUVIA) 100 MG tablet Take 1 tablet (100 mg total) by mouth daily. 90 tablet 1  . TAZTIA XT 360 MG 24 hr capsule     . urea (CARMOL) 40 % CREA Apply 1 application topically 2 (two) times daily. 1 each 3   No current facility-administered medications on file prior to visit.    No Known Allergies   Objective: General: Patient is awake, alert, and oriented x 3 and in no acute distress.  Integument: Skin is warm, severely dry bilateral. Nails are tender, severely long, thickened and dystrophic Lopez subungual debris, consistent Lopez onychomycosis and onychogyphosis, 1-5 bilateral.  +  diffuse hyperpigmentation in scaly plaques Lopez callus skin Lopez focal keratotic horn like proliferations of skin plantar aspects of both feet bilateral sub met 4. No signs of infection. No open lesions present bilateral. Remaining integument unremarkable.  Vasculature:  Dorsalis Pedis pulse 1/4 bilateral. Posterior Tibial pulse  1/4 bilateral. Capillary fill time <4 sec 1-5 bilateral. No hair growth to the level of the digits.Temperature gradient within normal limits. No varicosities present bilateral. No edema present bilateral.   Neurology: The patient has  diminished sensation measured Lopez a 5.07/10g Semmes Weinstein Monofilament at all pedal sites bilateral . Vibratory sensation diminished bilateral Lopez tuning fork. No Babinski sign present bilateral.   Musculoskeletal: Asymptomatic hammertoe pedal deformities noted bilateral. Muscular strength 5/5 in all lower extremity muscular groups bilateral without pain on range of motion . No tenderness Lopez calf compression bilateral.  Assessment and Plan: Problem List Items Addressed This Visit    None    Visit Diagnoses    Pain due to onychomycosis of toenail    -  Primary   Porokeratosis       Keratosis lichenoides chronica       Diabetic polyneuropathy associated Lopez type 2 diabetes mellitus ([place])         -Examined patient. -Discussed and educated patient on diabetic foot care, especially Lopez  regards to the vascular, neurological and musculoskeletal systems.  -Stressed the importance of good glycemic control and the detriment of not  controlling glucose levels in relation to the foot. -Mechanically debrided severe horn like keratosis x 2 using sterile blade and all nails 1-5 bilateral using sterile nail nipper and filed Lopez dremel without incident  -Continue Lopez creams recommended by dermatologist and urea; May consider Fluconazole if fails to continue to improve -Recommend continue Lopez daily skin emollients and vinegar soaks -Recommend continue Lopez good hygiene habits -Answered all patient questions -Patient to return  in 3 months for foot care -Patient advised to call the office if any problems or questions arise in the meantime.  Landis Martins, DPM

## 2016-06-16 DIAGNOSIS — I129 Hypertensive chronic kidney disease with stage 1 through stage 4 chronic kidney disease, or unspecified chronic kidney disease: Secondary | ICD-10-CM | POA: Diagnosis not present

## 2016-06-16 DIAGNOSIS — N183 Chronic kidney disease, stage 3 (moderate): Secondary | ICD-10-CM | POA: Diagnosis not present

## 2016-06-16 DIAGNOSIS — D631 Anemia in chronic kidney disease: Secondary | ICD-10-CM | POA: Diagnosis not present

## 2016-06-16 DIAGNOSIS — E1129 Type 2 diabetes mellitus with other diabetic kidney complication: Secondary | ICD-10-CM | POA: Diagnosis not present

## 2016-07-16 ENCOUNTER — Other Ambulatory Visit: Payer: Self-pay | Admitting: Internal Medicine

## 2016-08-05 ENCOUNTER — Other Ambulatory Visit: Payer: Self-pay | Admitting: Internal Medicine

## 2016-08-05 ENCOUNTER — Other Ambulatory Visit (INDEPENDENT_AMBULATORY_CARE_PROVIDER_SITE_OTHER): Payer: Medicare Other

## 2016-08-05 ENCOUNTER — Ambulatory Visit (INDEPENDENT_AMBULATORY_CARE_PROVIDER_SITE_OTHER): Payer: Medicare Other | Admitting: Internal Medicine

## 2016-08-05 ENCOUNTER — Encounter: Payer: Self-pay | Admitting: Internal Medicine

## 2016-08-05 VITALS — BP 138/80 | HR 85 | Temp 98.4°F | Resp 20 | Wt 165.0 lb

## 2016-08-05 DIAGNOSIS — E119 Type 2 diabetes mellitus without complications: Secondary | ICD-10-CM

## 2016-08-05 DIAGNOSIS — Z0001 Encounter for general adult medical examination with abnormal findings: Secondary | ICD-10-CM

## 2016-08-05 DIAGNOSIS — N179 Acute kidney failure, unspecified: Secondary | ICD-10-CM

## 2016-08-05 DIAGNOSIS — I10 Essential (primary) hypertension: Secondary | ICD-10-CM | POA: Diagnosis not present

## 2016-08-05 DIAGNOSIS — H9191 Unspecified hearing loss, right ear: Secondary | ICD-10-CM | POA: Diagnosis not present

## 2016-08-05 LAB — HEPATIC FUNCTION PANEL
ALK PHOS: 63 U/L (ref 39–117)
ALT: 17 U/L (ref 0–53)
AST: 15 U/L (ref 0–37)
Albumin: 4.4 g/dL (ref 3.5–5.2)
BILIRUBIN DIRECT: 0.1 mg/dL (ref 0.0–0.3)
TOTAL PROTEIN: 8.2 g/dL (ref 6.0–8.3)
Total Bilirubin: 0.3 mg/dL (ref 0.2–1.2)

## 2016-08-05 LAB — CBC WITH DIFFERENTIAL/PLATELET
BASOS ABS: 0 10*3/uL (ref 0.0–0.1)
Basophils Relative: 0.2 % (ref 0.0–3.0)
EOS ABS: 0.2 10*3/uL (ref 0.0–0.7)
Eosinophils Relative: 1.4 % (ref 0.0–5.0)
HCT: 38.6 % — ABNORMAL LOW (ref 39.0–52.0)
Hemoglobin: 13.1 g/dL (ref 13.0–17.0)
LYMPHS ABS: 1.9 10*3/uL (ref 0.7–4.0)
LYMPHS PCT: 15.4 % (ref 12.0–46.0)
MCHC: 34 g/dL (ref 30.0–36.0)
MCV: 94.4 fl (ref 78.0–100.0)
MONO ABS: 0.3 10*3/uL (ref 0.1–1.0)
Monocytes Relative: 2.7 % — ABNORMAL LOW (ref 3.0–12.0)
NEUTROS ABS: 9.9 10*3/uL — AB (ref 1.4–7.7)
Neutrophils Relative %: 80.3 % — ABNORMAL HIGH (ref 43.0–77.0)
PLATELETS: 283 10*3/uL (ref 150.0–400.0)
RBC: 4.09 Mil/uL — ABNORMAL LOW (ref 4.22–5.81)
RDW: 14.4 % (ref 11.5–15.5)
WBC: 12.3 10*3/uL — ABNORMAL HIGH (ref 4.0–10.5)

## 2016-08-05 LAB — LIPID PANEL
Cholesterol: 150 mg/dL (ref 0–200)
HDL: 40.8 mg/dL (ref >39.00)
LDL Cholesterol: 84 mg/dL (ref 0–99)
NONHDL: 108.71
Total CHOL/HDL Ratio: 4
Triglycerides: 125 mg/dL (ref 0.0–149.0)
VLDL: 25 mg/dL (ref 0.0–40.0)

## 2016-08-05 LAB — URINALYSIS, ROUTINE W REFLEX MICROSCOPIC
BILIRUBIN URINE: NEGATIVE
Hgb urine dipstick: NEGATIVE
KETONES UR: NEGATIVE
Leukocytes, UA: NEGATIVE
Nitrite: NEGATIVE
PH: 6 (ref 5.0–8.0)
Specific Gravity, Urine: 1.02 (ref 1.000–1.030)
TOTAL PROTEIN, URINE-UPE24: 100 — AB
UROBILINOGEN UA: 0.2 (ref 0.0–1.0)
Urine Glucose: NEGATIVE

## 2016-08-05 LAB — TSH: TSH: 1.24 u[IU]/mL (ref 0.35–4.50)

## 2016-08-05 LAB — MICROALBUMIN / CREATININE URINE RATIO
Creatinine,U: 129.1 mg/dL
MICROALB/CREAT RATIO: 49.4 mg/g — AB (ref 0.0–30.0)
Microalb, Ur: 63.8 mg/dL — ABNORMAL HIGH (ref 0.0–1.9)

## 2016-08-05 LAB — BASIC METABOLIC PANEL
BUN: 31 mg/dL — ABNORMAL HIGH (ref 6–23)
CHLORIDE: 107 meq/L (ref 96–112)
CO2: 23 meq/L (ref 19–32)
CREATININE: 2.64 mg/dL — AB (ref 0.40–1.50)
Calcium: 9.5 mg/dL (ref 8.4–10.5)
GFR: 32.32 mL/min — ABNORMAL LOW (ref >60.00)
Glucose, Bld: 179 mg/dL — ABNORMAL HIGH (ref 70–99)
Potassium: 4.5 mEq/L (ref 3.5–5.1)
Sodium: 139 mEq/L (ref 135–145)

## 2016-08-05 LAB — PSA: PSA: 0.64 ng/mL (ref 0.10–4.00)

## 2016-08-05 LAB — HEMOGLOBIN A1C: HEMOGLOBIN A1C: 5.5 % (ref 4.6–6.5)

## 2016-08-05 MED ORDER — SITAGLIPTIN PHOSPHATE 100 MG PO TABS: 100.0000 mg | ORAL_TABLET | Freq: Every day | ORAL | 3 refills | Status: DC

## 2016-08-05 NOTE — Patient Instructions (Addendum)
You had Prevnar pneumonia shot today  Your right ear was cleared of wax today  You are given the [place] patient assist form filled out today with the prescription  Please continue all other medications as before, and refills have been done if requested.  Please have the pharmacy call with any other refills you may need.  Please continue your efforts at being more active, low cholesterol diet, and weight control.  You are otherwise up to date with prevention measures today.  Please keep your appointments with your specialists as you may have planned  Please go to the LAB in the Basement (turn left off the elevator) for the tests to be done today  You will be contacted by phone if any changes need to be made immediately.  Otherwise, you will receive a letter about your results with an explanation, but please check with MyChart first.  Please remember to sign up for MyChart if you have not done so, as this will be important to you in the future with finding out test results, communicating by private email, and scheduling acute appointments online when needed.  If you have Medicare related insurance (such as traditoinal Medicare, [employer] or [employer], or similar), Please make an appointment at the Scheduling desk with Maudie Mercury, the [employer], for your Wellness Visit in this office, which is a benefit with your insurance.  Please return in 6 months, or sooner if needed, with Lab testing done 3-5 days before

## 2016-08-05 NOTE — Progress Notes (Signed)
Subjective:    Patient ID: Christopher Lopez, male    DOB: October 31, 1960, 56 y.o.   MRN: [id number]  HPI  Here for wellness and f/u;  Overall doing ok;  Pt denies Chest pain, worsening SOB, DOE, wheezing, orthopnea, PND, worsening LE edema, palpitations, dizziness or syncope.  Pt denies neurological change such as new headache, facial or extremity weakness.  Pt denies polydipsia, polyuria, or low sugar symptoms. Pt states overall good compliance with treatment and medications, good tolerability, and has been trying to follow appropriate diet.  Pt denies worsening depressive symptoms, suicidal ideation or panic. No fever, night sweats, wt loss, loss of appetite, or other constitutional symptoms.  Pt states good ability with ADL's, has low fall risk, home safety reviewed and adequate, no other significant changes in hearing or vision, and only occasionally active with exercise. Sees podiatry for tinea pedis.  Mother plans to help with eye doctor appt - Dr Katy Fitch.  Decliens flu shot but ok for prevnar.  Right ear hearing loss worse in the past wk - ? Wax impaction.  No other new hx Past Medical History:  Diagnosis Date  . Alcohol abuse 09/18/2011  . Allergic rhinitis, cause unspecified 09/20/2011  . Anemia, unspecified 09/18/2011  . Childhood asthma 09/20/2011  . Colitis 09/18/2011  . Dementia 09/18/2011  . Diabetes mellitus   . Diverticulosis of colon without hemorrhage 02/27/2014  . Foot ulcer ([place]) 09/18/2011  . HTN (hypertension) 09/18/2011  . Hyperlipidemia 11/08/2011  . Hypertension   . Impaired glucose tolerance 09/18/2011  . Pneumonia 09/18/2011  . Pre-ulcerative corn or callous 11/20/2011  . Type II or unspecified type diabetes mellitus without mention of complication, uncontrolled 09/20/2011   Past Surgical History:  Procedure Laterality Date  . TONSILLECTOMY  1985    reports that he has been smoking Cigarettes.  He has been smoking about 0.50 packs per day. He has never used smokeless tobacco. He reports  that he drinks about 1.2 oz of alcohol per week . He reports that he does not use drugs. family history includes Diabetes in his maternal grandfather, mother, and sister; Heart disease in his mother; Prostate cancer in his paternal grandfather; Transient ischemic attack in his mother. No Known Allergies Current Outpatient Prescriptions on File Prior to Visit  Medication Sig Dispense Refill  . aspirin EC 81 MG tablet Take 1 tablet (81 mg total) by mouth daily. 90 tablet 11  . Blood Glucose Monitoring Suppl (ONE TOUCH ULTRA 2) W/DEVICE KIT Use to check blood sugars daily Dx E11.9 1 each 0  . diltiazem (DILACOR XR) 240 MG 24 hr capsule Take 240 mg by mouth daily.    Marland Kitchen glipiZIDE (GLUCOTROL) 10 MG tablet Take 1 tablet (10 mg total) by mouth 2 (two) times daily before a meal. Yearly physical w/labs due in February must see MD for refills 180 tablet 0  . glucose blood test strip 1 each by Other route 2 (two) times daily. Use to check blood sugars twice a day Dx E11.9 100 each 3  . hydrochlorothiazide (HYDRODIURIL) 25 MG tablet Take 1 tablet (25 mg total) by mouth daily. Yearly physical due in February must see MD for refills 90 tablet 0  . Lancets (ONETOUCH ULTRASOFT) lancets 1 each by Other route 2 (two) times daily. Use to check blood sugars twice a day Dx e11.9 100 each 3  . lisinopril (PRINIVIL,ZESTRIL) 20 MG tablet Take 1 tablet (20 mg total) by mouth 2 (two) times daily. Yearly physical due in  February must see MD for future refill 180 tablet 0  . lovastatin (MEVACOR) 40 MG tablet Take 1 tablet (40 mg total) by mouth daily. Yearly physical due in Feb must see MD for future refills 90 tablet 0  . metFORMIN (GLUCOPHAGE) 1000 MG tablet TAKE 1 TABLET BY MOUTH TWO  TIMES DAILY WITH MEALS 180 tablet 0  . Multiple Vitamin (MULTIVITAMIN) tablet Take 1 tablet by mouth daily.    Marland Kitchen TAZTIA XT 360 MG 24 hr capsule     . urea (CARMOL) 40 % CREA Apply 1 application topically 2 (two) times daily. 1 each 3   No  current facility-administered medications on file prior to visit.     Review of Systems Constitutional: Negative for increased diaphoresis, or other activity, appetite or siginficant weight change other than noted HENT: Negative for worsening hearing loss, ear pain, facial swelling, mouth sores and neck stiffness.   Eyes: Negative for other worsening pain, redness or visual disturbance.  Respiratory: Negative for choking or stridor Cardiovascular: Negative for other chest pain and palpitations.  Gastrointestinal: Negative for worsening diarrhea, blood in stool, or abdominal distention Genitourinary: Negative for hematuria, flank pain or change in urine volume.  Musculoskeletal: Negative for myalgias or other joint complaints.  Skin: Negative for other color change and wound or drainage.  Neurological: Negative for syncope and numbness. other than noted Hematological: Negative for adenopathy. or other swelling Psychiatric/Behavioral: Negative for hallucinations, SI, self-injury, decreased concentration or other worsening agitation.  All other system neg per pt, limited due to dementia    Objective:   Physical Exam BP 138/80   Pulse 85   Temp 98.4 F (36.9 C) (Oral)   Resp 20   Wt 165 lb (74.8 kg)   SpO2 98%   BMI 22.38 kg/m  VS noted,  Constitutional: Pt is oriented to person, place, and time. Appears well-developed and well-nourished, in no significant distress Head: Normocephalic and atraumatic  Eyes: Conjunctivae and EOM are normal. Pupils are equal, round, and reactive to light Right Ear: External ear normal.  Left Ear: External ear normal Nose: Nose normal.  Mouth/Throat: Oropharynx is clear and moist  Neck: Normal range of motion. Neck supple. No JVD present. No tracheal deviation present or significant neck LA or mass Cardiovascular: Normal rate, regular rhythm, normal heart sounds and intact distal pulses.   Pulmonary/Chest: Effort normal and breath sounds without rales  or wheezing  Abdominal: Soft. Bowel sounds are normal. NT. No HSM  Musculoskeletal: Normal range of motion. Exhibits no edema Lymphadenopathy: Has no cervical adenopathy.  Neurological: Pt is alert and oriented to person only, at baseline confused. Pt has normal reflexes. No cranial nerve deficit. Motor grossly intact Skin: Skin is warm and dry. No rash noted or new ulcers Psychiatric:  Has normal mood and affect. Behavior is normal.  No other new exam findings    Assessment & Plan:

## 2016-08-05 NOTE — Progress Notes (Signed)
Pre visit review using our clinic review tool, if applicable. No additional management support is needed unless otherwise documented below in the visit note. 

## 2016-08-08 NOTE — Assessment & Plan Note (Signed)

## 2016-08-08 NOTE — Assessment & Plan Note (Signed)
stable overall by history and exam, recent data reviewed with pt, and pt to continue medical treatment as before,  to f/u any worsening symptoms or concerns BP Readings from Last 3 Encounters:  08/05/16 138/80  01/09/16 126/80  08/22/15 (!) 142/72

## 2016-08-08 NOTE — Assessment & Plan Note (Signed)
Improved with irrigation, cont to follow

## 2016-08-08 NOTE — Assessment & Plan Note (Signed)
stable overall by history and exam, recent data reviewed with pt, and pt to continue medical treatment as before,  to f/u any worsening symptoms or concerns Lab Results  Component Value Date   HGBA1C 5.5 08/05/2016

## 2016-08-10 ENCOUNTER — Ambulatory Visit
Admission: RE | Admit: 2016-08-10 | Discharge: 2016-08-10 | Disposition: A | Discharge status: Home / Self Care | Payer: Medicare Other | Source: Ambulatory Visit | Attending: Internal Medicine | Admitting: Internal Medicine

## 2016-08-10 DIAGNOSIS — N189 Chronic kidney disease, unspecified: Secondary | ICD-10-CM | POA: Diagnosis not present

## 2016-08-10 DIAGNOSIS — C189 Malignant neoplasm of colon, unspecified: Secondary | ICD-10-CM | POA: Diagnosis not present

## 2016-08-10 DIAGNOSIS — N179 Acute kidney failure, unspecified: Secondary | ICD-10-CM

## 2016-08-12 ENCOUNTER — Other Ambulatory Visit (INDEPENDENT_AMBULATORY_CARE_PROVIDER_SITE_OTHER): Payer: Medicare Other

## 2016-08-12 ENCOUNTER — Encounter: Payer: Self-pay | Admitting: Internal Medicine

## 2016-08-12 ENCOUNTER — Ambulatory Visit (INDEPENDENT_AMBULATORY_CARE_PROVIDER_SITE_OTHER): Payer: Medicare Other | Admitting: Internal Medicine

## 2016-08-12 VITALS — BP 140/80 | HR 99 | Temp 98.0°F | Resp 20 | Wt 166.0 lb

## 2016-08-12 DIAGNOSIS — N189 Chronic kidney disease, unspecified: Secondary | ICD-10-CM

## 2016-08-12 DIAGNOSIS — N184 Chronic kidney disease, stage 4 (severe): Secondary | ICD-10-CM | POA: Diagnosis not present

## 2016-08-12 DIAGNOSIS — N179 Acute kidney failure, unspecified: Secondary | ICD-10-CM

## 2016-08-12 DIAGNOSIS — I10 Essential (primary) hypertension: Secondary | ICD-10-CM | POA: Diagnosis not present

## 2016-08-12 DIAGNOSIS — E119 Type 2 diabetes mellitus without complications: Secondary | ICD-10-CM

## 2016-08-12 LAB — BASIC METABOLIC PANEL
BUN: 27 mg/dL — ABNORMAL HIGH (ref 6–23)
CALCIUM: 9.4 mg/dL (ref 8.4–10.5)
CHLORIDE: 107 meq/L (ref 96–112)
CO2: 23 meq/L (ref 19–32)
Creatinine, Ser: 2.42 mg/dL — ABNORMAL HIGH (ref 0.40–1.50)
GFR: 35.73 mL/min — ABNORMAL LOW (ref >60.00)
Glucose, Bld: 191 mg/dL — ABNORMAL HIGH (ref 70–99)
Potassium: 4.8 mEq/L (ref 3.5–5.1)
SODIUM: 137 meq/L (ref 135–145)

## 2016-08-12 MED ORDER — METOPROLOL SUCCINATE ER 50 MG PO TB24: 50.0000 mg | ORAL_TABLET | Freq: Every day | ORAL | 3 refills | Status: DC

## 2016-08-12 NOTE — Progress Notes (Signed)
Pre visit review using our clinic review tool, if applicable. No additional management support is needed unless otherwise documented below in the visit note. 

## 2016-08-12 NOTE — Assessment & Plan Note (Signed)
Ok to start toprol XL 50 qd for better control, o/w stable overall by history and exam, recent data reviewed with pt, and pt to continue medical treatment as before,  to f/u any worsening symptoms or concerns BP Readings from Last 3 Encounters:  08/12/16 140/80  08/05/16 138/80  01/09/16 126/80

## 2016-08-12 NOTE — Assessment & Plan Note (Signed)
stable overall by history and exam, recent data reviewed with pt, and pt to continue medical treatment as before,  to f/u any worsening symptoms or concerns Lab Results  Component Value Date   HGBA1C 5.5 08/05/2016

## 2016-08-12 NOTE — Assessment & Plan Note (Signed)
[place] for f/u lab today, f/u renal as planned, avoid nephrotoxic meds, to stay off the hct and ACE for now

## 2016-08-12 NOTE — Progress Notes (Signed)
Subjective:    Patient ID: Christopher Lopez, male    DOB: 08/17/1960, 56 y.o.   MRN: [id number]  HPI  Here to fu with mother present, with recent mild AKI on CKD with GFR to 32 last wk.  I had him stop the hct and lsinopril, renal u/s proved neg for hydronephrosis, and asked him to return today for fu. Sees Dr Lorrene Reid as well, last seen dec 2017.  Pt denies chest pain, increased sob or doe, wheezing, orthopnea, PND, increased LE swelling, palpitations, dizziness or syncope.  Pt denies new neurological symptoms such as new headache, or facial or extremity weakness or numbness   Pt denies polydipsia, polyuria  No new complaints  , no new history Past Medical History:  Diagnosis Date  . Alcohol abuse 09/18/2011  . Allergic rhinitis, cause unspecified 09/20/2011  . Anemia, unspecified 09/18/2011  . Childhood asthma 09/20/2011  . Colitis 09/18/2011  . Dementia 09/18/2011  . Diabetes mellitus   . Diverticulosis of colon without hemorrhage 02/27/2014  . Foot ulcer ([place]) 09/18/2011  . HTN (hypertension) 09/18/2011  . Hyperlipidemia 11/08/2011  . Hypertension   . Impaired glucose tolerance 09/18/2011  . Pneumonia 09/18/2011  . Pre-ulcerative corn or callous 11/20/2011  . Type II or unspecified type diabetes mellitus without mention of complication, uncontrolled 09/20/2011   Past Surgical History:  Procedure Laterality Date  . TONSILLECTOMY  1985    reports that he has been smoking Cigarettes.  He has been smoking about 0.50 packs per day. He has never used smokeless tobacco. He reports that he drinks about 1.2 oz of alcohol per week . He reports that he does not use drugs. family history includes Diabetes in his maternal grandfather, mother, and sister; Heart disease in his mother; Prostate cancer in his paternal grandfather; Transient ischemic attack in his mother. No Known Allergies / Current Outpatient Prescriptions on File Prior to Visit  Medication Sig Dispense Refill  . aspirin EC 81 MG tablet Take 1 tablet  (81 mg total) by mouth daily. 90 tablet 11  . Blood Glucose Monitoring Suppl (ONE TOUCH ULTRA 2) W/DEVICE KIT Use to check blood sugars daily Dx E11.9 1 each 0  . diltiazem (DILACOR XR) 240 MG 24 hr capsule Take 240 mg by mouth daily.    Marland Kitchen glipiZIDE (GLUCOTROL) 10 MG tablet Take 1 tablet (10 mg total) by mouth 2 (two) times daily before a meal. Yearly physical w/labs due in February must see MD for refills 180 tablet 0  . glucose blood test strip 1 each by Other route 2 (two) times daily. Use to check blood sugars twice a day Dx E11.9 100 each 3  . Lancets (ONETOUCH ULTRASOFT) lancets 1 each by Other route 2 (two) times daily. Use to check blood sugars twice a day Dx e11.9 100 each 3  . lovastatin (MEVACOR) 40 MG tablet Take 1 tablet (40 mg total) by mouth daily. Yearly physical due in Feb must see MD for future refills 90 tablet 0  . metFORMIN (GLUCOPHAGE) 1000 MG tablet TAKE 1 TABLET BY MOUTH TWO  TIMES DAILY WITH MEALS 180 tablet 0  . Multiple Vitamin (MULTIVITAMIN) tablet Take 1 tablet by mouth daily.    . sitaGLIPtin (JANUVIA) 100 MG tablet Take 1 tablet (100 mg total) by mouth daily. 90 tablet 3  . urea (CARMOL) 40 % CREA Apply 1 application topically 2 (two) times daily. 1 each 3   No current facility-administered medications on file prior to visit.  Review of Systems  Constitutional: Negative for unusual diaphoresis or night sweats HENT: Negative for ear swelling or discharge Eyes: Negative for worsening visual haziness  Respiratory: Negative for choking and stridor.   Gastrointestinal: Negative for distension or worsening eructation Genitourinary: Negative for retention or change in urine volume.  Musculoskeletal: Negative for other MSK pain or swelling Skin: Negative for color change and worsening wound Neurological: Negative for tremors and numbness other than noted  Psychiatric/Behavioral: Negative for decreased concentration or agitation other than above   All other system  neg per pt and mother   Objective:   Physical Exam BP 140/80   Pulse 99   Temp 98 F (36.7 C) (Oral)   Resp 20   Wt 166 lb (75.3 kg)   SpO2 96%   BMI 22.51 kg/m  VS noted,  Constitutional: Pt appears in no apparent distress HENT: Head: NCAT.  Right Ear: External ear normal.  Left Ear: External ear normal.  Eyes: . Pupils are equal, round, and reactive to light. Conjunctivae and EOM are normal Neck: Normal range of motion. Neck supple.  Cardiovascular: Normal rate and regular rhythm.   Pulmonary/Chest: Effort normal and breath sounds without rales or wheezing.  Abd:  Soft, NT, ND, + BS Neurological: Pt is alert. At baseline confused , motor grossly intact Skin: Skin is warm. No rash, no LE edema Psychiatric: Pt behavior is normal. No agitation.  Npo other new exam findings    Assessment & Plan:

## 2016-08-12 NOTE — Patient Instructions (Signed)
Please take all new medication as prescribed - the metoprolol XL 50 mg per day for blood pressure  OK to stay off the HCT and lisinopril for now  Please continue all other medications as before, and refills have been done if requested.  Please have the pharmacy call with any other refills you may need.  Please keep your appointments with your specialists as you may have planned - Dr Lorrene Reid  Please go to the LAB in the Basement (turn left off the elevator) for the tests to be done today  You will be contacted by phone if any changes need to be made immediately.  Otherwise, you will receive a letter about your results with an explanation, but please check with MyChart first.  Please remember to sign up for MyChart if you have not done so, as this will be important to you in the future with finding out test results, communicating by private email, and scheduling acute appointments online when needed.  Marland Kitchen

## 2016-08-13 ENCOUNTER — Telehealth: Payer: Self-pay | Admitting: Internal Medicine

## 2016-08-13 ENCOUNTER — Telehealth: Payer: Self-pay

## 2016-08-13 MED ORDER — METOPROLOL SUCCINATE ER 50 MG PO TB24: 50.0000 mg | ORAL_TABLET | Freq: Every day | ORAL | 3 refills | Status: DC

## 2016-08-13 NOTE — Telephone Encounter (Signed)
This has been corrected.

## 2016-08-13 NOTE — Telephone Encounter (Signed)
States that BP med sent yesterday went to incorrect pharmacy.  Needs to go to [facility] on [address]

## 2016-08-13 NOTE — Telephone Encounter (Signed)
Medication sent to correct pharmacy  

## 2016-08-17 DIAGNOSIS — L850 Acquired ichthyosis: Secondary | ICD-10-CM | POA: Diagnosis not present

## 2016-08-17 DIAGNOSIS — B353 Tinea pedis: Secondary | ICD-10-CM | POA: Diagnosis not present

## 2016-08-17 DIAGNOSIS — L602 Onychogryphosis: Secondary | ICD-10-CM | POA: Diagnosis not present

## 2016-09-14 ENCOUNTER — Ambulatory Visit (INDEPENDENT_AMBULATORY_CARE_PROVIDER_SITE_OTHER): Payer: Medicare Other | Admitting: Sports Medicine

## 2016-09-14 DIAGNOSIS — L280 Lichen simplex chronicus: Secondary | ICD-10-CM | POA: Diagnosis not present

## 2016-09-14 DIAGNOSIS — E1142 Type 2 diabetes mellitus with diabetic polyneuropathy: Secondary | ICD-10-CM | POA: Diagnosis not present

## 2016-09-14 DIAGNOSIS — M79676 Pain in unspecified toe(s): Secondary | ICD-10-CM

## 2016-09-14 DIAGNOSIS — B351 Tinea unguium: Secondary | ICD-10-CM | POA: Diagnosis not present

## 2016-09-14 DIAGNOSIS — Q828 Other specified congenital malformations of skin: Secondary | ICD-10-CM

## 2016-09-14 NOTE — Progress Notes (Signed)
Patient ID: [name], male   DOB: [birth year], 56 y.o.   MRN: [id number]   Subjective: Christopher Lopez is a 56 y.o. male patient with history of type2 diabetes who returns to office today complaining of callus and long, painful nails while ambulating in shoes; unable to trim. Patient states that the glucose reading this morning was not recorded; patient is assisted by brother who reports that he has been more compliant with soaking and apply skin cream with improvement. Patient denies any new changes in medication or new problems. Patient denies any new cramping, numbness, burning or tingling in the legs.  Patient Active Problem List   Diagnosis Date Noted  . Acute renal failure superimposed on chronic kidney disease ([place]) 08/12/2016  . Acute hearing loss, right 08/05/2016  . Left foot drop 03/05/2014  . Diverticulosis of colon without hemorrhage 02/27/2014  . CKD (chronic kidney disease) 02/27/2014  . Left knee pain 02/14/2014  . Nausea alone 02/13/2013  . Early satiety 02/13/2013  . Unspecified constipation 02/13/2013  . Pain of left calf 02/22/2012  . Calf pain, left 11/20/2011  . Pre-ulcerative corn or callous 11/20/2011  . Hyperlipidemia 11/08/2011  . Allergic rhinitis, cause unspecified 09/20/2011  . Diabetes ([place]) 09/20/2011  . Childhood asthma 09/20/2011  . Pneumonia 09/18/2011  . Anemia, unspecified 09/18/2011  . HTN (hypertension) 09/18/2011  . Colitis 09/18/2011  . Encounter for well adult exam with abnormal findings 09/18/2011  . Dementia 09/18/2011  . Alcohol abuse 09/18/2011  . Foot ulcer ([place]) 09/18/2011   Current Outpatient Prescriptions on File Prior to Visit  Medication Sig Dispense Refill  . aspirin EC 81 MG tablet Take 1 tablet (81 mg total) by mouth daily. 90 tablet 11  . Blood Glucose Monitoring Suppl (ONE TOUCH ULTRA 2) W/DEVICE KIT Use to check blood sugars daily Dx E11.9 1 each 0  . diltiazem (DILACOR XR) 240 MG 24 hr capsule Take 240 mg by mouth  daily.    Marland Kitchen glipiZIDE (GLUCOTROL) 10 MG tablet Take 1 tablet (10 mg total) by mouth 2 (two) times daily before a meal. Yearly physical w/labs due in February must see MD for refills 180 tablet 0  . glucose blood test strip 1 each by Other route 2 (two) times daily. Use to check blood sugars twice a day Dx E11.9 100 each 3  . Lancets (ONETOUCH ULTRASOFT) lancets 1 each by Other route 2 (two) times daily. Use to check blood sugars twice a day Dx e11.9 100 each 3  . lovastatin (MEVACOR) 40 MG tablet Take 1 tablet (40 mg total) by mouth daily. Yearly physical due in Feb must see MD for future refills 90 tablet 0  . metFORMIN (GLUCOPHAGE) 1000 MG tablet TAKE 1 TABLET BY MOUTH TWO  TIMES DAILY WITH MEALS 180 tablet 0  . metoprolol succinate (TOPROL-XL) 50 MG 24 hr tablet Take 1 tablet (50 mg total) by mouth daily. Take with or immediately following a meal. 90 tablet 3  . Multiple Vitamin (MULTIVITAMIN) tablet Take 1 tablet by mouth daily.    . sitaGLIPtin (JANUVIA) 100 MG tablet Take 1 tablet (100 mg total) by mouth daily. 90 tablet 3  . urea (CARMOL) 40 % CREA Apply 1 application topically 2 (two) times daily. 1 each 3   No current facility-administered medications on file prior to visit.    No Known Allergies   Objective: General: Patient is awake, alert, and oriented x 3 and in no acute distress.  Integument: Skin is warm,  severely dry bilateral. Nails are tender, severely long, thickened and dystrophic with subungual debris, consistent with onychomycosis and onychogyphosis, 1-5 bilateral.  + diffuse hyperpigmentation in scaly plaques with callus skin with focal keratotic horn like proliferations of skin plantar aspects of both feet bilateral sub met 4. No signs of infection. No open lesions present bilateral. Remaining integument unremarkable.  Vasculature:  Dorsalis Pedis pulse 1/4 bilateral. Posterior Tibial pulse  1/4 bilateral. Capillary fill time <4 sec 1-5 bilateral. No hair growth to the  level of the digits.Temperature gradient within normal limits. No varicosities present bilateral. No edema present bilateral.   Neurology: The patient has diminished sensation measured with a 5.07/10g Semmes Weinstein Monofilament at all pedal sites bilateral . Vibratory sensation diminished bilateral with tuning fork. No Babinski sign present bilateral.   Musculoskeletal: Asymptomatic hammertoe pedal deformities noted bilateral. Muscular strength 5/5 in all lower extremity muscular groups bilateral without pain on range of motion . No tenderness with calf compression bilateral.  Assessment and Plan: Problem List Items Addressed This Visit    None    Visit Diagnoses    Pain due to onychomycosis of toenail    -  Primary   Porokeratosis       Keratosis lichenoides chronica       Diabetic polyneuropathy associated with type 2 diabetes mellitus ([place])         -Examined patient. -Discussed and educated patient on diabetic foot care, especially with  regards to the vascular, neurological and musculoskeletal systems.  -Stressed the importance of good glycemic control and the detriment of not  controlling glucose levels in relation to the foot. -Mechanically debrided severe horn like keratosis x 2 using sterile blade and all nails 1-5 bilateral using sterile nail nipper and filed with dremel without incident  -Continue with creams recommended by dermatologist and urea; May consider adding Fluconazole if fails to continue to improve -Recommend continue with daily skin emollients and vinegar soaks -Recommend continue with good hygiene habits -Answered all patient questions -Patient to return  in 3 months for foot care -Patient advised to call the office if any problems or questions arise in the meantime.  Landis Martins, DPM

## 2016-10-20 DIAGNOSIS — D631 Anemia in chronic kidney disease: Secondary | ICD-10-CM | POA: Diagnosis not present

## 2016-10-20 DIAGNOSIS — N183 Chronic kidney disease, stage 3 (moderate): Secondary | ICD-10-CM | POA: Diagnosis not present

## 2016-10-20 DIAGNOSIS — I129 Hypertensive chronic kidney disease with stage 1 through stage 4 chronic kidney disease, or unspecified chronic kidney disease: Secondary | ICD-10-CM | POA: Diagnosis not present

## 2016-10-20 DIAGNOSIS — E1129 Type 2 diabetes mellitus with other diabetic kidney complication: Secondary | ICD-10-CM | POA: Diagnosis not present

## 2016-11-16 DIAGNOSIS — B353 Tinea pedis: Secondary | ICD-10-CM | POA: Diagnosis not present

## 2016-11-16 DIAGNOSIS — L850 Acquired ichthyosis: Secondary | ICD-10-CM | POA: Diagnosis not present

## 2016-12-02 ENCOUNTER — Other Ambulatory Visit: Payer: Self-pay | Admitting: Internal Medicine

## 2016-12-21 ENCOUNTER — Ambulatory Visit (INDEPENDENT_AMBULATORY_CARE_PROVIDER_SITE_OTHER): Payer: Medicare Other | Admitting: Sports Medicine

## 2016-12-21 ENCOUNTER — Encounter: Payer: Self-pay | Admitting: Sports Medicine

## 2016-12-21 DIAGNOSIS — B351 Tinea unguium: Secondary | ICD-10-CM

## 2016-12-21 DIAGNOSIS — L280 Lichen simplex chronicus: Secondary | ICD-10-CM | POA: Diagnosis not present

## 2016-12-21 DIAGNOSIS — E1142 Type 2 diabetes mellitus with diabetic polyneuropathy: Secondary | ICD-10-CM

## 2016-12-21 DIAGNOSIS — Q828 Other specified congenital malformations of skin: Secondary | ICD-10-CM | POA: Diagnosis not present

## 2016-12-21 DIAGNOSIS — M79676 Pain in unspecified toe(s): Secondary | ICD-10-CM

## 2016-12-21 DIAGNOSIS — L820 Inflamed seborrheic keratosis: Secondary | ICD-10-CM

## 2016-12-21 MED ORDER — UREA 10 % EX CREA: TOPICAL_CREAM | CUTANEOUS | 0 refills | Status: DC | PRN

## 2016-12-21 MED ORDER — UREA 40 % EX CREA: 1.0000 "application " | TOPICAL_CREAM | Freq: Two times a day (BID) | CUTANEOUS | 3 refills | Status: DC

## 2016-12-21 MED ORDER — FLUCONAZOLE POWD: 1.0000 "application " | Freq: Every day | 1 refills | Status: DC

## 2016-12-21 NOTE — Progress Notes (Signed)
Patient ID: Christopher Lopez, male   DOB: 1961/02/21, 56 y.o.   MRN: [id number]   Subjective: Christopher Lopez is a 56 y.o. male patient with history of type2 diabetes who returns to office today complaining of callus and long, painful nails while ambulating in shoes; unable to trim. Patient states that the glucose reading this morning was not recorded; patient is assisted by brother who reports that he has been doing well. Patient denies any new changes in medication or new problems. Patient denies any new cramping, numbness, burning or tingling in the legs.  Patient Active Problem List   Diagnosis Date Noted  . Acute renal failure superimposed on chronic kidney disease ([place]) 08/12/2016  . Acute hearing loss, right 08/05/2016  . Left foot drop 03/05/2014  . Diverticulosis of colon without hemorrhage 02/27/2014  . CKD (chronic kidney disease) 02/27/2014  . Left knee pain 02/14/2014  . Nausea alone 02/13/2013  . Early satiety 02/13/2013  . Unspecified constipation 02/13/2013  . Pain of left calf 02/22/2012  . Calf pain, left 11/20/2011  . Pre-ulcerative corn or callous 11/20/2011  . Hyperlipidemia 11/08/2011  . Allergic rhinitis, cause unspecified 09/20/2011  . Diabetes ([place]) 09/20/2011  . Childhood asthma 09/20/2011  . Pneumonia 09/18/2011  . Anemia, unspecified 09/18/2011  . HTN (hypertension) 09/18/2011  . Colitis 09/18/2011  . Encounter for well adult exam with abnormal findings 09/18/2011  . Dementia 09/18/2011  . Alcohol abuse 09/18/2011  . Foot ulcer ([place]) 09/18/2011   Current Outpatient Prescriptions on File Prior to Visit  Medication Sig Dispense Refill  . aspirin EC 81 MG tablet Take 1 tablet (81 mg total) by mouth daily. 90 tablet 11  . Blood Glucose Monitoring Suppl (ONE TOUCH ULTRA 2) W/DEVICE KIT Use to check blood sugars daily Dx E11.9 1 each 0  . diltiazem (DILACOR XR) 240 MG 24 hr capsule Take 240 mg by mouth daily.    Marland Kitchen glipiZIDE (GLUCOTROL) 10 MG tablet TAKE 1  TABLET BY MOUTH 2  TIMES DAILY BEFORE A MEAL. 180 tablet 2  . glucose blood test strip 1 each by Other route 2 (two) times daily. Use to check blood sugars twice a day Dx E11.9 100 each 3  . Lancets (ONETOUCH ULTRASOFT) lancets 1 each by Other route 2 (two) times daily. Use to check blood sugars twice a day Dx e11.9 100 each 3  . lovastatin (MEVACOR) 40 MG tablet TAKE 1 TABLET BY MOUTH  DAILY. 90 tablet 2  . metFORMIN (GLUCOPHAGE) 1000 MG tablet TAKE 1 TABLET BY MOUTH TWO  TIMES DAILY WITH MEALS 180 tablet 0  . metoprolol succinate (TOPROL-XL) 50 MG 24 hr tablet Take 1 tablet (50 mg total) by mouth daily. Take with or immediately following a meal. 90 tablet 3  . Multiple Vitamin (MULTIVITAMIN) tablet Take 1 tablet by mouth daily.    . sitaGLIPtin (JANUVIA) 100 MG tablet Take 1 tablet (100 mg total) by mouth daily. 90 tablet 3   No current facility-administered medications on file prior to visit.    No Known Allergies   Objective: General: Patient is awake, alert, and oriented x 3 and in no acute distress.  Integument: Skin is warm, severely dry bilateral. Nails are tender, severely long, thickened and dystrophic with subungual debris, consistent with onychomycosis and onychogyphosis, 1-5 bilateral.  + diffuse hyperpigmentation in scaly plaques with callus skin with focal keratotic horn like proliferations of skin plantar aspects of both feet bilateral sub met 4. No signs of infection. No  open lesions present bilateral. Remaining integument unremarkable   Vasculature:  Dorsalis Pedis pulse 1/4 bilateral. Posterior Tibial pulse  1/4 bilateral. Capillary fill time <4 sec 1-5 bilateral. No hair growth to the level of the digits.Temperature gradient within normal limits. No varicosities present bilateral. No edema present bilateral.   Neurology: The patient has diminished sensation measured with a 5.07/10g Semmes Weinstein Monofilament at all pedal sites bilateral . Vibratory sensation diminished  bilateral with tuning fork. No Babinski sign present bilateral.   Musculoskeletal: Asymptomatic hammertoe pedal deformities noted bilateral. Muscular strength 5/5 in all lower extremity muscular groups bilateral without pain on range of motion . No tenderness with calf compression bilateral.  Assessment and Plan: Problem List Items Addressed This Visit    None    Visit Diagnoses    Pain due to onychomycosis of toenail    -  Primary   Porokeratosis       Keratosis lichenoides chronica       Relevant Medications   Fluconazole POWD   Diabetic polyneuropathy associated with type 2 diabetes mellitus ([place])         -Examined patient. -Discussed and educated patient on diabetic foot care, especially with  regards to the vascular, neurological and musculoskeletal systems.  -Stressed the importance of good glycemic control and the detriment of not  controlling glucose levels in relation to the foot. -Mechanically debrided severe horn like keratosis x 2 using sterile blade and all nails 1-5 bilateral using sterile nail nipper and filed with dremel without incident  -Continue with creams recommended by dermatologist and urea; Rx Fluconazole powder  -Recommend continue with daily skin emollients and vinegar soaks -Recommend continue with good hygiene habits -Answered all patient questions -Patient to return  in 3 months for foot care -Patient advised to call the office if any problems or questions arise in the meantime.  Landis Martins, DPM

## 2017-02-02 ENCOUNTER — Encounter: Payer: Self-pay | Admitting: Internal Medicine

## 2017-02-02 ENCOUNTER — Ambulatory Visit (INDEPENDENT_AMBULATORY_CARE_PROVIDER_SITE_OTHER): Payer: Medicare Other | Admitting: Internal Medicine

## 2017-02-02 VITALS — BP 140/92 | HR 68 | Ht 72.0 in | Wt 168.0 lb

## 2017-02-02 DIAGNOSIS — N183 Chronic kidney disease, stage 3 unspecified: Secondary | ICD-10-CM

## 2017-02-02 DIAGNOSIS — M7989 Other specified soft tissue disorders: Secondary | ICD-10-CM

## 2017-02-02 DIAGNOSIS — Z0000 Encounter for general adult medical examination without abnormal findings: Secondary | ICD-10-CM | POA: Diagnosis not present

## 2017-02-02 DIAGNOSIS — I10 Essential (primary) hypertension: Secondary | ICD-10-CM | POA: Diagnosis not present

## 2017-02-02 DIAGNOSIS — E119 Type 2 diabetes mellitus without complications: Secondary | ICD-10-CM

## 2017-02-02 LAB — POCT GLYCOSYLATED HEMOGLOBIN (HGB A1C): HEMOGLOBIN A1C: 6.9

## 2017-02-02 NOTE — Assessment & Plan Note (Addendum)
To cont to f/u renal as planned next month with labs, o/w stable overall by history and exam, recent data reviewed with pt, and pt to continue medical treatment as before,  to f/u any worsening symptoms or concerns Lab Results  Component Value Date   CREATININE 2.42 (H) 08/12/2016

## 2017-02-02 NOTE — Assessment & Plan Note (Addendum)
stable overall by history and exam, recent data reviewed with pt, and pt to continue medical treatment as before,  to f/u any worsening symptoms or concerns  

## 2017-02-02 NOTE — Patient Instructions (Addendum)
Your A1c was OK today  Please continue all other medications as before, and refills have been done if requested.  Please have the pharmacy call with any other refills you may need.  Please continue your efforts at being more active, low cholesterol diet, and weight control.  Please keep your appointments with your specialists as you may have planned  You will be contacted regarding the referral for: Left leg ultrasound to rule out blood clot  Please return in 6 months, or sooner if needed, with Lab testing done 3-5 days before

## 2017-02-02 NOTE — Progress Notes (Signed)
Subjective:    Patient ID: Christopher Lopez, male    DOB: 06/26/1961, 56 y.o.   MRN: [id number]  HPI  Here to f/u; overall doing ok,  Pt denies chest pain, increasing sob or doe, wheezing, orthopnea, PND, increased LE swelling, palpitations, dizziness or syncope.  Pt denies new neurological symptoms such as new headache, or facial or extremity weakness or numbness.  Pt denies polydipsia, polyuria, or low sugar episode.  Pt states overall good compliance with meds, mostly trying to follow appropriate diet, with wt overall stable.  Pt is not sure how long, but has new LLE swelling, mother is unaware, and he c/o pain to whole leg, has chronic left knee pain but no effusion or giveaways, has seen Dr Tamala Julian for this Past Medical History:  Diagnosis Date  . Alcohol abuse 09/18/2011  . Allergic rhinitis, cause unspecified 09/20/2011  . Anemia, unspecified 09/18/2011  . Childhood asthma 09/20/2011  . Colitis 09/18/2011  . Dementia 09/18/2011  . Diabetes mellitus   . Diverticulosis of colon without hemorrhage 02/27/2014  . Foot ulcer ([place]) 09/18/2011  . HTN (hypertension) 09/18/2011  . Hyperlipidemia 11/08/2011  . Hypertension   . Impaired glucose tolerance 09/18/2011  . Pneumonia 09/18/2011  . Pre-ulcerative corn or callous 11/20/2011  . Type II or unspecified type diabetes mellitus without mention of complication, uncontrolled 09/20/2011   Past Surgical History:  Procedure Laterality Date  . TONSILLECTOMY  1985    reports that he has been smoking Cigarettes.  He has been smoking about 0.50 packs per day. He has never used smokeless tobacco. He reports that he drinks about 1.2 oz of alcohol per week . He reports that he does not use drugs. family history includes Diabetes in his maternal grandfather, mother, and sister; Heart disease in his mother; Prostate cancer in his paternal grandfather; Transient ischemic attack in his mother. No Known Allergies Current Outpatient Prescriptions on File Prior to Visit    Medication Sig Dispense Refill  . aspirin EC 81 MG tablet Take 1 tablet (81 mg total) by mouth daily. 90 tablet 11  . Blood Glucose Monitoring Suppl (ONE TOUCH ULTRA 2) W/DEVICE KIT Use to check blood sugars daily Dx E11.9 1 each 0  . diltiazem (DILACOR XR) 240 MG 24 hr capsule Take 240 mg by mouth daily.    . Fluconazole POWD 1 application by Does not apply route daily. 500 g 1  . glipiZIDE (GLUCOTROL) 10 MG tablet TAKE 1 TABLET BY MOUTH 2  TIMES DAILY BEFORE A MEAL. 180 tablet 2  . glucose blood test strip 1 each by Other route 2 (two) times daily. Use to check blood sugars twice a day Dx E11.9 100 each 3  . Lancets (ONETOUCH ULTRASOFT) lancets 1 each by Other route 2 (two) times daily. Use to check blood sugars twice a day Dx e11.9 100 each 3  . lovastatin (MEVACOR) 40 MG tablet TAKE 1 TABLET BY MOUTH  DAILY. 90 tablet 2  . metFORMIN (GLUCOPHAGE) 1000 MG tablet TAKE 1 TABLET BY MOUTH TWO  TIMES DAILY WITH MEALS 180 tablet 0  . metoprolol succinate (TOPROL-XL) 50 MG 24 hr tablet Take 1 tablet (50 mg total) by mouth daily. Take with or immediately following a meal. 90 tablet 3  . Multiple Vitamin (MULTIVITAMIN) tablet Take 1 tablet by mouth daily.    . sitaGLIPtin (JANUVIA) 100 MG tablet Take 1 tablet (100 mg total) by mouth daily. 90 tablet 3  . urea (CARMOL) 10 % cream Apply  topically as needed. 71 g 0  . urea (CARMOL) 40 % CREA Apply 1 application topically 2 (two) times daily. 1 each 3   No current facility-administered medications on file prior to visit.     Review of Systems  Constitutional: Negative for other unusual diaphoresis or sweats HENT: Negative for ear discharge or swelling Eyes: Negative for other worsening visual disturbances Respiratory: Negative for stridor or other swelling  Gastrointestinal: Negative for worsening distension or other blood Genitourinary: Negative for retention or other urinary change Musculoskeletal: Negative for other MSK pain or swelling Skin:  Negative for color change or other new lesions Neurological: Negative for worsening tremors and other numbness  Psychiatric/Behavioral: Negative for worsening agitation or other fatigue All other system neg per pt    Objective:   Physical Exam BP (!) 140/92   Pulse 68   Ht 6' (1.829 m)   Wt 168 lb (76.2 kg)   SpO2 99%   BMI 22.78 kg/m  VS noted,  Constitutional: Pt appears in NAD HENT: Head: NCAT.  Right Ear: External ear normal.  Left Ear: External ear normal.  Eyes: . Pupils are equal, round, and reactive to light. Conjunctivae and EOM are normal Nose: without d/c or deformity Neck: Neck supple. Gross normal ROM Cardiovascular: Normal rate and regular rhythm.   Pulmonary/Chest: Effort normal and breath sounds without rales or wheezing.  Neurological: Pt is alert. At baseline orientation, motor grossly intact Skin: Skin is warm. No rashes, other new lesions, LLE edema 1-2+ below the knee with mild calf tender, equivocal Homans, o/w neurovasc intact Psychiatric: Pt behavior is normal without agitation  No other exam findings   POCT glycosylated hemoglobin (Hb A1C)  Order: [id number]  Status:  Final result Visible to patient:  No (Not Released) Dx:  Type 2 diabetes mellitus without comp...  Component 11:08  Hemoglobin A1C 6.9            Assessment & Plan:

## 2017-02-05 NOTE — Assessment & Plan Note (Signed)
stable overall by history and exam, recent data reviewed with pt, and pt to continue medical treatment as before,  to f/u any worsening symptoms or concerns BP Readings from Last 3 Encounters:  02/02/17 (!) 140/92  08/12/16 140/80  08/05/16 138/80

## 2017-02-05 NOTE — Assessment & Plan Note (Signed)
Cant r/o dvt - for venous doppler LLE asap, decilnes ER  referral

## 2017-02-07 ENCOUNTER — Telehealth: Payer: Self-pay | Admitting: Internal Medicine

## 2017-02-07 ENCOUNTER — Ambulatory Visit (HOSPITAL_COMMUNITY)
Admission: RE | Admit: 2017-02-07 | Discharge: 2017-02-07 | Disposition: A | Discharge status: Home / Self Care | Payer: Medicare Other | Source: Ambulatory Visit | Attending: Internal Medicine | Admitting: Internal Medicine

## 2017-02-07 DIAGNOSIS — M7989 Other specified soft tissue disorders: Secondary | ICD-10-CM | POA: Diagnosis not present

## 2017-02-07 NOTE — Progress Notes (Signed)
*  PRELIMINARY RESULTS* Vascular Ultrasound Left lower extremity venous duplex has been completed.  Preliminary findings: No evidence of DVT or baker's cyst.   Called results to Baron Hamper, RDMS, RVT  02/07/2017, 10:01 AM

## 2017-02-07 NOTE — Telephone Encounter (Signed)
Negative For DVT in left leg

## 2017-02-25 ENCOUNTER — Other Ambulatory Visit: Payer: Self-pay | Admitting: Internal Medicine

## 2017-03-09 DIAGNOSIS — D631 Anemia in chronic kidney disease: Secondary | ICD-10-CM | POA: Diagnosis not present

## 2017-03-09 DIAGNOSIS — N183 Chronic kidney disease, stage 3 (moderate): Secondary | ICD-10-CM | POA: Diagnosis not present

## 2017-03-09 DIAGNOSIS — I129 Hypertensive chronic kidney disease with stage 1 through stage 4 chronic kidney disease, or unspecified chronic kidney disease: Secondary | ICD-10-CM | POA: Diagnosis not present

## 2017-03-09 DIAGNOSIS — E1129 Type 2 diabetes mellitus with other diabetic kidney complication: Secondary | ICD-10-CM | POA: Diagnosis not present

## 2017-03-29 ENCOUNTER — Ambulatory Visit: Payer: Medicare Other | Admitting: Sports Medicine

## 2017-04-12 ENCOUNTER — Ambulatory Visit (INDEPENDENT_AMBULATORY_CARE_PROVIDER_SITE_OTHER): Payer: Medicare Other | Admitting: Sports Medicine

## 2017-04-12 ENCOUNTER — Encounter: Payer: Self-pay | Admitting: Sports Medicine

## 2017-04-12 VITALS — BP 179/99 | HR 90 | Resp 16

## 2017-04-12 DIAGNOSIS — Q828 Other specified congenital malformations of skin: Secondary | ICD-10-CM | POA: Diagnosis not present

## 2017-04-12 DIAGNOSIS — B351 Tinea unguium: Secondary | ICD-10-CM | POA: Diagnosis not present

## 2017-04-12 DIAGNOSIS — E1142 Type 2 diabetes mellitus with diabetic polyneuropathy: Secondary | ICD-10-CM | POA: Diagnosis not present

## 2017-04-12 DIAGNOSIS — M79676 Pain in unspecified toe(s): Secondary | ICD-10-CM | POA: Diagnosis not present

## 2017-04-12 DIAGNOSIS — L280 Lichen simplex chronicus: Secondary | ICD-10-CM

## 2017-04-12 MED ORDER — KETOCONAZOLE 2 % EX CREA: 1.0000 "application " | TOPICAL_CREAM | Freq: Every day | CUTANEOUS | 5 refills | Status: DC

## 2017-04-12 NOTE — Progress Notes (Signed)
Patient ID: [name], male   DOB: 19-Jun-1961, 56 y.o.   MRN: [id number]   Subjective: Christopher Lopez is a 56 y.o. male patient with history of type2 diabetes who returns to office today complaining of callus and long, painful nails while ambulating in shoes; unable to trim. Patient states that the glucose reading this morning was not recorded; patient is assisted by brother who reports that he has been doing well. Patient denies any new changes in medication or new problems. Need refill on cream. Patient denies any new cramping, numbness, burning or tingling in the legs.  Patient Active Problem List   Diagnosis Date Noted  . Left leg swelling 02/02/2017  . Acute hearing loss, right 08/05/2016  . Left foot drop 03/05/2014  . Diverticulosis of colon without hemorrhage 02/27/2014  . CKD (chronic kidney disease) 02/27/2014  . Left knee pain 02/14/2014  . Nausea alone 02/13/2013  . Early satiety 02/13/2013  . Unspecified constipation 02/13/2013  . Pain of left calf 02/22/2012  . Calf pain, left 11/20/2011  . Pre-ulcerative corn or callous 11/20/2011  . Hyperlipidemia 11/08/2011  . Allergic rhinitis, cause unspecified 09/20/2011  . Diabetes ([place]) 09/20/2011  . Childhood asthma 09/20/2011  . Pneumonia 09/18/2011  . Anemia, unspecified 09/18/2011  . HTN (hypertension) 09/18/2011  . Colitis 09/18/2011  . Encounter for well adult exam with abnormal findings 09/18/2011  . Dementia 09/18/2011  . Alcohol abuse 09/18/2011  . Foot ulcer ([place]) 09/18/2011   Current Outpatient Prescriptions on File Prior to Visit  Medication Sig Dispense Refill  . aspirin EC 81 MG tablet Take 1 tablet (81 mg total) by mouth daily. 90 tablet 11  . Blood Glucose Monitoring Suppl (ONE TOUCH ULTRA 2) W/DEVICE KIT Use to check blood sugars daily Dx E11.9 1 each 0  . diltiazem (DILACOR XR) 240 MG 24 hr capsule Take 240 mg by mouth daily.    . Fluconazole POWD 1 application by Does not apply route daily. 500 g 1   . glipiZIDE (GLUCOTROL) 10 MG tablet TAKE 1 TABLET BY MOUTH 2  TIMES DAILY BEFORE A MEAL. 180 tablet 2  . glucose blood test strip 1 each by Other route 2 (two) times daily. Use to check blood sugars twice a day Dx E11.9 100 each 3  . Lancets (ONETOUCH ULTRASOFT) lancets 1 each by Other route 2 (two) times daily. Use to check blood sugars twice a day Dx e11.9 100 each 3  . lovastatin (MEVACOR) 40 MG tablet TAKE 1 TABLET BY MOUTH  DAILY. 90 tablet 2  . metFORMIN (GLUCOPHAGE) 1000 MG tablet TAKE 1 TABLET BY MOUTH TWO  TIMES DAILY WITH MEALS 180 tablet 0  . metoprolol succinate (TOPROL-XL) 50 MG 24 hr tablet Take 1 tablet (50 mg total) by mouth daily. Take with or immediately following a meal. 90 tablet 3  . Multiple Vitamin (MULTIVITAMIN) tablet Take 1 tablet by mouth daily.    . sitaGLIPtin (JANUVIA) 100 MG tablet Take 1 tablet (100 mg total) by mouth daily. 90 tablet 3  . urea (CARMOL) 10 % cream Apply topically as needed. 71 g 0  . urea (CARMOL) 40 % CREA Apply 1 application topically 2 (two) times daily. 1 each 3   No current facility-administered medications on file prior to visit.    No Known Allergies   Objective: General: Patient is awake, alert, and oriented x 3 and in no acute distress.  Integument: Skin is warm, severely dry bilateral. Nails are tender, severely long,  thickened and dystrophic with subungual debris, consistent with onychomycosis and onychogyphosis, 1-5 bilateral.  + diffuse hyperpigmentation in scaly plaques with callus skin with focal keratotic horn like proliferations of skin plantar aspects of both feet bilateral sub met 4. No signs of infection. No open lesions present bilateral. Remaining integument unremarkable   Vasculature:  Dorsalis Pedis pulse 1/4 bilateral. Posterior Tibial pulse  1/4 bilateral. Capillary fill time <4 sec 1-5 bilateral. No hair growth to the level of the digits.Temperature gradient within normal limits. No varicosities present bilateral.  No edema present bilateral.   Neurology: The patient has diminished sensation measured with a 5.07/10g Semmes Weinstein Monofilament at all pedal sites bilateral . Vibratory sensation diminished bilateral with tuning fork. No Babinski sign present bilateral.   Musculoskeletal: Asymptomatic hammertoe pedal deformities noted bilateral. Muscular strength 5/5 in all lower extremity muscular groups bilateral without pain on range of motion . No tenderness with calf compression bilateral.  Assessment and Plan: Problem List Items Addressed This Visit    None    Visit Diagnoses    Pain due to onychomycosis of toenail    -  Primary   Relevant Medications   ketoconazole (NIZORAL) 2 % cream   Porokeratosis       Relevant Medications   ketoconazole (NIZORAL) 2 % cream   Keratosis lichenoides chronica       Relevant Medications   ketoconazole (NIZORAL) 2 % cream   Diabetic polyneuropathy associated with type 2 diabetes mellitus ([facility])       Relevant Medications   ketoconazole (NIZORAL) 2 % cream     -Examined patient. -Discussed and educated patient on diabetic foot care, especially with  regards to the vascular, neurological and musculoskeletal systems.  -Stressed the importance of good glycemic control and the detriment of not  controlling glucose levels in relation to the foot. -Mechanically debrided severe horn like keratosis x 2 using sterile blade and all nails 1-5 bilateral using sterile nail nipper and filed with dremel without incident  -Continue with creams recommended by dermatologist and urea; Refilled Ketoconazole cream  -Recommend continue with daily skin emollients and vinegar soaks -Recommend continue with good hygiene habits -Answered all patient questions -Patient to return  in 3 months for foot care -Patient advised to call the office if any problems or questions arise in the meantime.  Landis Martins, DPM

## 2017-06-22 ENCOUNTER — Other Ambulatory Visit: Payer: Self-pay | Admitting: Internal Medicine

## 2017-06-22 MED ORDER — SITAGLIPTIN PHOSPHATE 100 MG PO TABS: 100.0000 mg | ORAL_TABLET | Freq: Every day | ORAL | 3 refills | Status: DC

## 2017-06-22 NOTE — Telephone Encounter (Signed)
[place] rx done for patient assist progrm application

## 2017-06-29 ENCOUNTER — Telehealth: Payer: Self-pay | Admitting: Internal Medicine

## 2017-06-29 MED ORDER — METFORMIN HCL 1000 MG PO TABS: ORAL_TABLET | ORAL | 0 refills | Status: DC

## 2017-06-29 MED ORDER — METOPROLOL SUCCINATE ER 50 MG PO TB24: 50.0000 mg | ORAL_TABLET | Freq: Every day | ORAL | 0 refills | Status: DC

## 2017-06-29 NOTE — Telephone Encounter (Signed)
Copied from [place] [phone]. Topic: Quick Communication - Rx Refill/Question >> Jun 29, 2017  3:00 PM Robina Ade, Helene Kelp D wrote: Has the patient contacted their pharmacy? Yes (Agent: If no, request that the patient contact the pharmacy for the refill.) Preferred Pharmacy (with phone number or street name): [facility] [place] [facility] Agent: Please be advised that RX refills may take up to 3 business days. We ask that you follow-up with your pharmacy. Sabino Donovan from [facility] called requesting refill on patients metFORMIN (GLUCOPHAGE) 1000 MG tablet and metoprolol succinate (TOPROL-XL) 50 MG 24 hr tablet.

## 2017-07-19 ENCOUNTER — Ambulatory Visit: Payer: Medicare Other | Admitting: Sports Medicine

## 2017-08-02 ENCOUNTER — Ambulatory Visit: Payer: Medicare Other | Admitting: Sports Medicine

## 2017-08-02 ENCOUNTER — Encounter: Payer: Self-pay | Admitting: Sports Medicine

## 2017-08-02 DIAGNOSIS — Q828 Other specified congenital malformations of skin: Secondary | ICD-10-CM

## 2017-08-02 DIAGNOSIS — M79676 Pain in unspecified toe(s): Secondary | ICD-10-CM | POA: Diagnosis not present

## 2017-08-02 DIAGNOSIS — E1142 Type 2 diabetes mellitus with diabetic polyneuropathy: Secondary | ICD-10-CM

## 2017-08-02 DIAGNOSIS — L280 Lichen simplex chronicus: Secondary | ICD-10-CM

## 2017-08-02 DIAGNOSIS — B351 Tinea unguium: Secondary | ICD-10-CM | POA: Diagnosis not present

## 2017-08-02 NOTE — Progress Notes (Signed)
Patient ID: Christopher Lopez, male   DOB: 1961-01-21, 57 y.o.   MRN: [id number]   Subjective: Christopher Lopez is a 57 y.o. male patient with history of type2 diabetes who returns to office today complaining of callus and long, painful nails while ambulating in shoes; unable to trim. Patient states that the glucose reading this morning was not recorded; patient is assisted by brother who reports that he has been doing well with no issues.   Patient Active Problem List   Diagnosis Date Noted  . Left leg swelling 02/02/2017  . Acute hearing loss, right 08/05/2016  . Left foot drop 03/05/2014  . Diverticulosis of colon without hemorrhage 02/27/2014  . CKD (chronic kidney disease) 02/27/2014  . Left knee pain 02/14/2014  . Nausea alone 02/13/2013  . Early satiety 02/13/2013  . Unspecified constipation 02/13/2013  . Pain of left calf 02/22/2012  . Calf pain, left 11/20/2011  . Pre-ulcerative corn or callous 11/20/2011  . Hyperlipidemia 11/08/2011  . Allergic rhinitis, cause unspecified 09/20/2011  . Diabetes ([place]) 09/20/2011  . Childhood asthma 09/20/2011  . Pneumonia 09/18/2011  . Anemia, unspecified 09/18/2011  . HTN (hypertension) 09/18/2011  . Colitis 09/18/2011  . Encounter for well adult exam with abnormal findings 09/18/2011  . Dementia 09/18/2011  . Alcohol abuse 09/18/2011  . Foot ulcer ([place]) 09/18/2011   Current Outpatient Medications on File Prior to Visit  Medication Sig Dispense Refill  . aspirin EC 81 MG tablet Take 1 tablet (81 mg total) by mouth daily. 90 tablet 11  . Blood Glucose Monitoring Suppl (ONE TOUCH ULTRA 2) W/DEVICE KIT Use to check blood sugars daily Dx E11.9 1 each 0  . diltiazem (DILACOR XR) 240 MG 24 hr capsule Take 240 mg by mouth daily.    . Fluconazole POWD 1 application by Does not apply route daily. 500 g 1  . glipiZIDE (GLUCOTROL) 10 MG tablet TAKE 1 TABLET BY MOUTH 2  TIMES DAILY BEFORE A MEAL. 180 tablet 2  . glucose blood test strip 1 each by  Other route 2 (two) times daily. Use to check blood sugars twice a day Dx E11.9 100 each 3  . ketoconazole (NIZORAL) 2 % cream Apply 1 application topically daily. 30 g 5  . Lancets (ONETOUCH ULTRASOFT) lancets 1 each by Other route 2 (two) times daily. Use to check blood sugars twice a day Dx e11.9 100 each 3  . lovastatin (MEVACOR) 40 MG tablet TAKE 1 TABLET BY MOUTH  DAILY. 90 tablet 2  . metFORMIN (GLUCOPHAGE) 1000 MG tablet TAKE 1 TABLET BY MOUTH TWO  TIMES DAILY WITH MEALS 180 tablet 0  . metoprolol succinate (TOPROL-XL) 50 MG 24 hr tablet Take 1 tablet (50 mg total) by mouth daily. Take with or immediately following a meal. 90 tablet 0  . Multiple Vitamin (MULTIVITAMIN) tablet Take 1 tablet by mouth daily.    . sitaGLIPtin (JANUVIA) 100 MG tablet Take 1 tablet (100 mg total) by mouth daily. 90 tablet 3  . urea (CARMOL) 10 % cream Apply topically as needed. 71 g 0  . urea (CARMOL) 40 % CREA Apply 1 application topically 2 (two) times daily. 1 each 3   No current facility-administered medications on file prior to visit.    No Known Allergies   Objective: General: Patient is awake, alert, and oriented x 3 and in no acute distress.  Integument: Skin is warm, severely dry bilateral. Nails are tender, severely long, thickened and dystrophic with subungual debris, consistent  with onychomycosis and onychogyphosis, 1-5 bilateral.  + diffuse hyperpigmentation in scaly plaques with callus skin with focal keratotic horn like proliferations of skin plantar aspects of both feet bilateral sub met 4. No signs of infection. No open lesions present bilateral. Remaining integument unremarkable   Vasculature:  Dorsalis Pedis pulse 1/4 bilateral. Posterior Tibial pulse  1/4 bilateral. Capillary fill time <4 sec 1-5 bilateral. No hair growth to the level of the digits.Temperature gradient within normal limits. No varicosities present bilateral. No edema present bilateral.   Neurology: The patient has  diminished sensation measured with a 5.07/10g Semmes Weinstein Monofilament at all pedal sites bilateral . Vibratory sensation diminished bilateral with tuning fork. No Babinski sign present bilateral.   Musculoskeletal: Asymptomatic hammertoe pedal deformities noted bilateral. Muscular strength 5/5 in all lower extremity muscular groups bilateral without pain on range of motion . No tenderness with calf compression bilateral.  Assessment and Plan: Problem List Items Addressed This Visit    None    Visit Diagnoses    Pain due to onychomycosis of toenail    -  Primary   Porokeratosis       Keratosis lichenoides chronica       Diabetic polyneuropathy associated with type 2 diabetes mellitus ([place])         -Examined patient. -Discussed and educated patient on diabetic foot care, especially with  regards to the vascular, neurological and musculoskeletal systems.  -Stressed the importance of good glycemic control and the detriment of not  controlling glucose levels in relation to the foot. -Mechanically debrided severe horn like keratosis x 2 using sterile blade and all nails 1-5 bilateral using sterile nail nipper and filed with dremel without incident  -Continue with creams recommended by dermatologist and urea; Refilled Ketoconazole cream  -Recommend continue with daily skin emollients and vinegar soaks -Recommend continue with good hygiene habits -Answered all patient questions -Patient to return  in 3 months for foot care -Patient advised to call the office if any problems or questions arise in the meantime.  Christopher Lopez, DPM

## 2017-08-11 ENCOUNTER — Other Ambulatory Visit: Payer: Self-pay

## 2017-08-11 ENCOUNTER — Ambulatory Visit: Payer: Medicare Other | Admitting: Internal Medicine

## 2017-08-11 ENCOUNTER — Encounter: Payer: Self-pay | Admitting: Internal Medicine

## 2017-08-11 ENCOUNTER — Other Ambulatory Visit (INDEPENDENT_AMBULATORY_CARE_PROVIDER_SITE_OTHER): Payer: Medicare Other

## 2017-08-11 VITALS — BP 132/88 | HR 84 | Temp 97.7°F | Ht 72.0 in | Wt 163.0 lb

## 2017-08-11 DIAGNOSIS — E119 Type 2 diabetes mellitus without complications: Secondary | ICD-10-CM | POA: Diagnosis not present

## 2017-08-11 DIAGNOSIS — Z114 Encounter for screening for human immunodeficiency virus [HIV]: Secondary | ICD-10-CM

## 2017-08-11 DIAGNOSIS — Z0000 Encounter for general adult medical examination without abnormal findings: Secondary | ICD-10-CM

## 2017-08-11 LAB — CBC WITH DIFFERENTIAL/PLATELET
BASOS ABS: 0.1 10*3/uL (ref 0.0–0.1)
BASOS PCT: 0.8 % (ref 0.0–3.0)
EOS ABS: 0.1 10*3/uL (ref 0.0–0.7)
Eosinophils Relative: 1.4 % (ref 0.0–5.0)
HEMATOCRIT: 38.9 % — AB (ref 39.0–52.0)
Hemoglobin: 13.1 g/dL (ref 13.0–17.0)
LYMPHS ABS: 2.2 10*3/uL (ref 0.7–4.0)
LYMPHS PCT: 22.5 % (ref 12.0–46.0)
MCHC: 33.7 g/dL (ref 30.0–36.0)
MCV: 96.9 fl (ref 78.0–100.0)
Monocytes Absolute: 0.4 10*3/uL (ref 0.1–1.0)
Monocytes Relative: 4 % (ref 3.0–12.0)
NEUTROS ABS: 7.1 10*3/uL (ref 1.4–7.7)
NEUTROS PCT: 71.3 % (ref 43.0–77.0)
PLATELETS: 312 10*3/uL (ref 150.0–400.0)
RBC: 4.02 Mil/uL — ABNORMAL LOW (ref 4.22–5.81)
RDW: 14.5 % (ref 11.5–15.5)
WBC: 9.9 10*3/uL (ref 4.0–10.5)

## 2017-08-11 LAB — URINALYSIS, ROUTINE W REFLEX MICROSCOPIC
BILIRUBIN URINE: NEGATIVE
LEUKOCYTES UA: NEGATIVE
NITRITE: NEGATIVE
PH: 6 (ref 5.0–8.0)
Specific Gravity, Urine: 1.025 (ref 1.000–1.030)
URINE GLUCOSE: NEGATIVE
UROBILINOGEN UA: 0.2 (ref 0.0–1.0)

## 2017-08-11 LAB — HEMOGLOBIN A1C: Hgb A1c MFr Bld: 6.1 % (ref 4.6–6.5)

## 2017-08-11 LAB — MICROALBUMIN / CREATININE URINE RATIO
Creatinine,U: 153.7 mg/dL
Microalb Creat Ratio: 64 mg/g — ABNORMAL HIGH (ref 0.0–30.0)
Microalb, Ur: 98.3 mg/dL — ABNORMAL HIGH (ref 0.0–1.9)

## 2017-08-11 LAB — LIPID PANEL
CHOL/HDL RATIO: 6
CHOLESTEROL: 220 mg/dL — AB (ref 0–200)
HDL: 38.5 mg/dL — ABNORMAL LOW (ref >39.00)
NonHDL: 181.71
Triglycerides: 251 mg/dL — ABNORMAL HIGH (ref 0.0–149.0)
VLDL: 50.2 mg/dL — ABNORMAL HIGH (ref 0.0–40.0)

## 2017-08-11 LAB — BASIC METABOLIC PANEL
BUN: 32 mg/dL — AB (ref 6–23)
CHLORIDE: 103 meq/L (ref 96–112)
CO2: 26 meq/L (ref 19–32)
Calcium: 9.4 mg/dL (ref 8.4–10.5)
Creatinine, Ser: 4.82 mg/dL (ref 0.40–1.50)
GFR: 16.08 mL/min — ABNORMAL LOW (ref >60.00)
Glucose, Bld: 156 mg/dL — ABNORMAL HIGH (ref 70–99)
Potassium: 4.2 mEq/L (ref 3.5–5.1)
Sodium: 139 mEq/L (ref 135–145)

## 2017-08-11 LAB — HEPATIC FUNCTION PANEL
ALBUMIN: 3.8 g/dL (ref 3.5–5.2)
ALT: 7 U/L (ref 0–53)
AST: 11 U/L (ref 0–37)
Alkaline Phosphatase: 59 U/L (ref 39–117)
BILIRUBIN DIRECT: 0.1 mg/dL (ref 0.0–0.3)
TOTAL PROTEIN: 7.8 g/dL (ref 6.0–8.3)
Total Bilirubin: 0.4 mg/dL (ref 0.2–1.2)

## 2017-08-11 LAB — TSH: TSH: 0.73 u[IU]/mL (ref 0.35–4.50)

## 2017-08-11 LAB — PSA: PSA: 0.55 ng/mL (ref 0.10–4.00)

## 2017-08-11 LAB — LDL CHOLESTEROL, DIRECT: Direct LDL: 131 mg/dL

## 2017-08-11 MED ORDER — ZOSTER VAC RECOMB ADJUVANTED 50 MCG/0.5ML IM SUSR: 0.5000 mL | Freq: Once | INTRAMUSCULAR | 1 refills | Status: AC

## 2017-08-11 MED ORDER — SITAGLIPTIN PHOSPHATE 100 MG PO TABS: 100.0000 mg | ORAL_TABLET | Freq: Every day | ORAL | 3 refills | Status: DC

## 2017-08-11 NOTE — Patient Instructions (Addendum)
Your shingles shot was sent to the pharmacy  Please continue all other medications as before, and refills have been done if requested.  Please have the pharmacy call with any other refills you may need.  Please continue your efforts at being more active, low cholesterol diet, and weight control.  You are otherwise up to date with prevention measures today.  Please keep your appointments with your specialists as you may have planned  Please go to the LAB in the Basement (turn left off the elevator) for the tests to be done today  You will be contacted by phone if any changes need to be made immediately.  Otherwise, you will receive a letter about your results with an explanation, but please check with MyChart first.  Please remember to sign up for MyChart if you have not done so, as this will be important to you in the future with finding out test results, communicating by private email, and scheduling acute appointments online when needed.  Please return in 6 months, or sooner if needed

## 2017-08-11 NOTE — Progress Notes (Signed)
Subjective:    Patient ID: Christopher Lopez, male    DOB: 1961/05/30, 57 y.o.   MRN: [id number]  HPI  Here for wellness and f/u;  Overall doing ok;  Pt denies Chest pain, worsening SOB, DOE, wheezing, orthopnea, PND, worsening LE edema, palpitations, dizziness or syncope.  Pt denies neurological change such as new headache, facial or extremity weakness.  Pt denies polydipsia, polyuria, or low sugar symptoms. Pt states overall good compliance with treatment and medications, good tolerability, and has been trying to follow appropriate diet.  Pt denies worsening depressive symptoms, suicidal ideation or panic. No fever, night sweats, wt loss, loss of appetite, or other constitutional symptoms.  Pt states good ability with ADL's, has low fall risk, home safety reviewed and adequate, no other significant changes in hearing or vision, and only occasionally active with exercise.  Mother plans to make appt with Dr Katy Fitch for him.  To see Dr Shan Levans next wk feb 4.  Conts to smoke, has no intention or concern for quitting  No other interval change or complaints Past Medical History:  Diagnosis Date  . Alcohol abuse 09/18/2011  . Allergic rhinitis, cause unspecified 09/20/2011  . Anemia, unspecified 09/18/2011  . Childhood asthma 09/20/2011  . Colitis 09/18/2011  . Dementia 09/18/2011  . Diabetes mellitus   . Diverticulosis of colon without hemorrhage 02/27/2014  . Foot ulcer ([place]) 09/18/2011  . HTN (hypertension) 09/18/2011  . Hyperlipidemia 11/08/2011  . Hypertension   . Impaired glucose tolerance 09/18/2011  . Pneumonia 09/18/2011  . Pre-ulcerative corn or callous 11/20/2011  . Type II or unspecified type diabetes mellitus without mention of complication, uncontrolled 09/20/2011   Past Surgical History:  Procedure Laterality Date  . TONSILLECTOMY  1985    reports that he has been smoking cigarettes.  He has been smoking about 0.50 packs per day. he has never used smokeless tobacco. He reports that he drinks about  1.2 oz of alcohol per week. He reports that he does not use drugs. family history includes Diabetes in his maternal grandfather, mother, and sister; Heart disease in his mother; Prostate cancer in his paternal grandfather; Transient ischemic attack in his mother. No Known Allergies Current Outpatient Medications on File Prior to Visit  Medication Sig Dispense Refill  . aspirin EC 81 MG tablet Take 1 tablet (81 mg total) by mouth daily. 90 tablet 11  . Blood Glucose Monitoring Suppl (ONE TOUCH ULTRA 2) W/DEVICE KIT Use to check blood sugars daily Dx E11.9 1 each 0  . diltiazem (DILACOR XR) 240 MG 24 hr capsule Take 240 mg by mouth daily.    . Fluconazole POWD 1 application by Does not apply route daily. 500 g 1  . glipiZIDE (GLUCOTROL) 10 MG tablet TAKE 1 TABLET BY MOUTH 2  TIMES DAILY BEFORE A MEAL. 180 tablet 2  . glucose blood test strip 1 each by Other route 2 (two) times daily. Use to check blood sugars twice a day Dx E11.9 100 each 3  . ketoconazole (NIZORAL) 2 % cream Apply 1 application topically daily. 30 g 5  . Lancets (ONETOUCH ULTRASOFT) lancets 1 each by Other route 2 (two) times daily. Use to check blood sugars twice a day Dx e11.9 100 each 3  . lovastatin (MEVACOR) 40 MG tablet TAKE 1 TABLET BY MOUTH  DAILY. 90 tablet 2  . metFORMIN (GLUCOPHAGE) 1000 MG tablet TAKE 1 TABLET BY MOUTH TWO  TIMES DAILY WITH MEALS 180 tablet 0  . metoprolol succinate (TOPROL-XL)  50 MG 24 hr tablet Take 1 tablet (50 mg total) by mouth daily. Take with or immediately following a meal. 90 tablet 0  . Multiple Vitamin (MULTIVITAMIN) tablet Take 1 tablet by mouth daily.    . urea (CARMOL) 10 % cream Apply topically as needed. 71 g 0  . urea (CARMOL) 40 % CREA Apply 1 application topically 2 (two) times daily. 1 each 3   No current facility-administered medications on file prior to visit.    Review of Systems  - per mother and pt Constitutional: Negative for other unusual diaphoresis, sweats, appetite  or weight changes HENT: Negative for other worsening hearing loss, ear pain, facial swelling, mouth sores or neck stiffness.   Eyes: Negative for other worsening pain, redness or other visual disturbance.  Respiratory: Negative for other stridor or swelling Cardiovascular: Negative for other palpitations or other chest pain  Gastrointestinal: Negative for worsening diarrhea or loose stools, blood in stool, distention or other pain Genitourinary: Negative for hematuria, flank pain or other change in urine volume.  Musculoskeletal: Negative for myalgias or other joint swelling.  Skin: Negative for other color change, or other wound or worsening drainage.  Neurological: Negative for other syncope or numbness. Hematological: Negative for other adenopathy or swelling Psychiatric/Behavioral: Negative for hallucinations, other worsening agitation, SI, self-injury, or new decreased concentration All other system neg per pt    Objective:   Physical Exam BP 132/88   Pulse 84   Temp 97.7 F (36.5 C) (Oral)   Ht 6' (1.829 m)   Wt 163 lb (73.9 kg)   SpO2 98%   BMI 22.11 kg/m  VS noted,  Constitutional: Pt is oriented to person, place, and time. Appears well-developed and well-nourished, in no significant distress and comfortable Head: Normocephalic and atraumatic  Eyes: Conjunctivae and EOM are normal. Pupils are equal, round, and reactive to light Right Ear: External ear normal without discharge Left Ear: External ear normal without discharge Nose: Nose without discharge or deformity Mouth/Throat: Oropharynx is without other ulcerations and moist  Neck: Normal range of motion. Neck supple. No JVD present. No tracheal deviation present or significant neck LA or mass Cardiovascular: Normal rate, regular rhythm, normal heart sounds and intact distal pulses.   Pulmonary/Chest: WOB normal and breath sounds without rales or wheezing  Abdominal: Soft. Bowel sounds are normal. NT. No HSM    Musculoskeletal: Normal range of motion. Exhibits no edema Lymphadenopathy: Has no other cervical adenopathy.  Neurological: Pt is alert and oriented to person, place, and time. Pt has normal reflexes. No cranial nerve deficit. Motor grossly intact, Gait intact Skin: Skin is warm and dry. No rash noted or new ulcerations Psychiatric:  Has normal mood and affect. Behavior is normal without agitation No other exam findings    Assessment & Plan:

## 2017-08-12 ENCOUNTER — Telehealth: Payer: Self-pay

## 2017-08-12 LAB — HIV ANTIBODY (ROUTINE TESTING W REFLEX): HIV 1&2 Ab, 4th Generation: NONREACTIVE

## 2017-08-12 NOTE — Telephone Encounter (Signed)
Pt's mother has been informed and expressed understanding.  

## 2017-08-12 NOTE — Telephone Encounter (Signed)
-----   Message from Biagio Borg, MD sent at 08/11/2017  7:00 PM EST ----- Left message on MyChart, pt to cont same tx except  The test results show that your current treatment is OK, except the kidney function is much worse compared to one year ago. You may be nearing the time for dialysis.  Please continue to follow up with your kidney doctor as planned.     Khyla Mccumbers to please inform pt's mother, and forward labs to Dr Shan Levans

## 2017-08-14 NOTE — Assessment & Plan Note (Signed)
stable overall by history and exam, recent data reviewed with pt, and pt to continue medical treatment as before,  to f/u any worsening symptoms or concerns Lab Results  Component Value Date   HGBA1C 6.1 08/11/2017

## 2017-08-14 NOTE — Assessment & Plan Note (Signed)

## 2017-08-15 DIAGNOSIS — N2581 Secondary hyperparathyroidism of renal origin: Secondary | ICD-10-CM | POA: Diagnosis not present

## 2017-08-15 DIAGNOSIS — E1122 Type 2 diabetes mellitus with diabetic chronic kidney disease: Secondary | ICD-10-CM | POA: Diagnosis not present

## 2017-08-15 DIAGNOSIS — I129 Hypertensive chronic kidney disease with stage 1 through stage 4 chronic kidney disease, or unspecified chronic kidney disease: Secondary | ICD-10-CM | POA: Diagnosis not present

## 2017-08-15 DIAGNOSIS — D631 Anemia in chronic kidney disease: Secondary | ICD-10-CM | POA: Diagnosis not present

## 2017-08-15 DIAGNOSIS — N184 Chronic kidney disease, stage 4 (severe): Secondary | ICD-10-CM | POA: Diagnosis not present

## 2017-08-17 ENCOUNTER — Telehealth: Payer: Self-pay | Admitting: Internal Medicine

## 2017-08-17 ENCOUNTER — Other Ambulatory Visit: Payer: Self-pay

## 2017-08-17 ENCOUNTER — Other Ambulatory Visit: Payer: Self-pay | Admitting: Internal Medicine

## 2017-08-17 DIAGNOSIS — N183 Chronic kidney disease, stage 3 unspecified: Secondary | ICD-10-CM

## 2017-08-17 DIAGNOSIS — F0390 Unspecified dementia without behavioral disturbance: Secondary | ICD-10-CM

## 2017-08-17 DIAGNOSIS — N186 End stage renal disease: Secondary | ICD-10-CM

## 2017-08-17 DIAGNOSIS — Z48812 Encounter for surgical aftercare following surgery on the circulatory system: Secondary | ICD-10-CM

## 2017-08-17 NOTE — Telephone Encounter (Signed)
If I recall correctly, I do not believe pt is being seen per [facility].  He was just here jan 31, and I think I can refer based on this visit   He will need HH with RN, and Social services - done today  OK to let mother know

## 2017-08-17 NOTE — Telephone Encounter (Signed)
Copied from [place] [id number]. Topic: Quick Communication - See Telephone Encounter >> Aug 17, 2017  9:28 AM Arletha Grippe wrote: CRM for notification. See Telephone encounter for:   08/17/17. Mom shirley called - it has been determined that pt needs to go on dialysis.  (Dr Lorrene Reid) . Pt is refusing to go to dialysis - mom is not able to care for pt, and would like to know what the process is for getting him placed somewhere.  Pt is also refusing to properly take his medication.  Pt diagnosed with dementia.  Cb# [phone] or [phone].

## 2017-08-17 NOTE — Telephone Encounter (Signed)
Pt's mother has been informed and expressed understanding.  

## 2017-09-02 ENCOUNTER — Other Ambulatory Visit: Payer: Self-pay | Admitting: Internal Medicine

## 2017-09-12 ENCOUNTER — Telehealth: Payer: Self-pay | Admitting: Internal Medicine

## 2017-09-12 DIAGNOSIS — D631 Anemia in chronic kidney disease: Secondary | ICD-10-CM | POA: Diagnosis not present

## 2017-09-12 DIAGNOSIS — Z7982 Long term (current) use of aspirin: Secondary | ICD-10-CM | POA: Diagnosis not present

## 2017-09-12 DIAGNOSIS — E785 Hyperlipidemia, unspecified: Secondary | ICD-10-CM | POA: Diagnosis not present

## 2017-09-12 DIAGNOSIS — K5730 Diverticulosis of large intestine without perforation or abscess without bleeding: Secondary | ICD-10-CM | POA: Diagnosis not present

## 2017-09-12 DIAGNOSIS — N186 End stage renal disease: Secondary | ICD-10-CM | POA: Diagnosis not present

## 2017-09-12 DIAGNOSIS — I120 Hypertensive chronic kidney disease with stage 5 chronic kidney disease or end stage renal disease: Secondary | ICD-10-CM | POA: Diagnosis not present

## 2017-09-12 DIAGNOSIS — E1142 Type 2 diabetes mellitus with diabetic polyneuropathy: Secondary | ICD-10-CM | POA: Diagnosis not present

## 2017-09-12 DIAGNOSIS — Z7984 Long term (current) use of oral hypoglycemic drugs: Secondary | ICD-10-CM | POA: Diagnosis not present

## 2017-09-12 DIAGNOSIS — Z9181 History of falling: Secondary | ICD-10-CM | POA: Diagnosis not present

## 2017-09-12 DIAGNOSIS — J45909 Unspecified asthma, uncomplicated: Secondary | ICD-10-CM | POA: Diagnosis not present

## 2017-09-12 DIAGNOSIS — E1122 Type 2 diabetes mellitus with diabetic chronic kidney disease: Secondary | ICD-10-CM | POA: Diagnosis not present

## 2017-09-12 NOTE — Telephone Encounter (Signed)
Ok for verbals 

## 2017-09-12 NOTE — Telephone Encounter (Signed)
Copied from [place]. Topic: Quick Communication - See Telephone Encounter >> Sep 12, 2017  2:37 PM Percell Belt A wrote: CRM for notification. See Telephone encounter for:  Christopher Lopez with [facility] home health [phone] Need verbals for PT and OT Eval  She needs to ask about the metformin   09/12/17.

## 2017-09-13 MED ORDER — DULAGLUTIDE 0.75 MG/0.5ML ~~LOC~~ SOAJ: 0.5000 mL | SUBCUTANEOUS | 3 refills | Status: DC

## 2017-09-13 NOTE — Telephone Encounter (Signed)
Notified Katie w/MD response. Joellen Jersey also states mom is questioning if pt should continue taking the metformin. Pt dx w/ renail failure, and was told that the metformin is bad on his kidneys. Should he continue taking or should MD rx something else.Marland KitchenJohny Chess

## 2017-09-13 NOTE — Telephone Encounter (Signed)
Since the kidney failure appears to be persistent, we should:  D/c metformin D/c glipizide  Start Trulicity 5.83 mg weekly  Ok to continue [place] as is  Please continue to check sugars and call in 2 wks if greater than 200 more often than not

## 2017-09-14 NOTE — Telephone Encounter (Signed)
Notified Katie w/MD response../lmb 

## 2017-09-19 ENCOUNTER — Telehealth: Payer: Self-pay | Admitting: Internal Medicine

## 2017-09-19 NOTE — Telephone Encounter (Signed)
Ok for verbal 

## 2017-09-19 NOTE — Telephone Encounter (Signed)
Caller name: Nicollette B Relation to pt: lpn from [facility] Call back number: [phone]  Pharmacy:  Reason for call:  Requesting verbal orders for home health, skilled nursing for disease, medication, education management for  1x 1 2x 2 1x 4 1 PRN

## 2017-09-20 ENCOUNTER — Encounter (HOSPITAL_COMMUNITY): Payer: Medicare Other

## 2017-09-20 ENCOUNTER — Encounter: Payer: Medicare Other | Admitting: Vascular Surgery

## 2017-09-20 ENCOUNTER — Other Ambulatory Visit (HOSPITAL_COMMUNITY): Payer: Medicare Other

## 2017-09-20 NOTE — Telephone Encounter (Signed)
Notified Nicolette w/MD response../lmb 

## 2017-09-21 ENCOUNTER — Telehealth: Payer: Self-pay

## 2017-09-21 ENCOUNTER — Telehealth: Payer: Self-pay | Admitting: Internal Medicine

## 2017-09-21 DIAGNOSIS — E1122 Type 2 diabetes mellitus with diabetic chronic kidney disease: Secondary | ICD-10-CM | POA: Diagnosis not present

## 2017-09-21 DIAGNOSIS — Z7984 Long term (current) use of oral hypoglycemic drugs: Secondary | ICD-10-CM | POA: Diagnosis not present

## 2017-09-21 DIAGNOSIS — J45909 Unspecified asthma, uncomplicated: Secondary | ICD-10-CM | POA: Diagnosis not present

## 2017-09-21 DIAGNOSIS — I120 Hypertensive chronic kidney disease with stage 5 chronic kidney disease or end stage renal disease: Secondary | ICD-10-CM | POA: Diagnosis not present

## 2017-09-21 DIAGNOSIS — K5730 Diverticulosis of large intestine without perforation or abscess without bleeding: Secondary | ICD-10-CM | POA: Diagnosis not present

## 2017-09-21 DIAGNOSIS — E1142 Type 2 diabetes mellitus with diabetic polyneuropathy: Secondary | ICD-10-CM | POA: Diagnosis not present

## 2017-09-21 DIAGNOSIS — N186 End stage renal disease: Secondary | ICD-10-CM | POA: Diagnosis not present

## 2017-09-21 DIAGNOSIS — Z7982 Long term (current) use of aspirin: Secondary | ICD-10-CM | POA: Diagnosis not present

## 2017-09-21 DIAGNOSIS — Z9181 History of falling: Secondary | ICD-10-CM | POA: Diagnosis not present

## 2017-09-21 DIAGNOSIS — E785 Hyperlipidemia, unspecified: Secondary | ICD-10-CM | POA: Diagnosis not present

## 2017-09-21 DIAGNOSIS — D631 Anemia in chronic kidney disease: Secondary | ICD-10-CM | POA: Diagnosis not present

## 2017-09-21 NOTE — Telephone Encounter (Signed)
Copied from [place] [phone]. Topic: Quick Communication - See Telephone Encounter >> Sep 21, 2017  1:09 PM Aurelio Brash B wrote: CRM for notification. See Telephone encounter for:  Katie, RN from [facility] is asking for a call back regarding  pts DM med changes and order for glucometer, as pt does not currently have one. Her contact number is [phone] 09/21/17.

## 2017-09-21 NOTE — Telephone Encounter (Signed)
Spoke with [facility] Jersey, they needed verbal orders to start services with the patient. They will be sending over updates along the way.

## 2017-09-21 NOTE — Telephone Encounter (Signed)
Called [place], LVM stating once the family has decided on which facility they would like to have the patient placed in have the facility fax over the necessary paperwork and FL2 form for PCP to fill out.  Copied from [place] [phone]. Topic: Quick Communication - See Telephone Encounter >> Sep 21, 2017  3:27 PM Vernona Rieger wrote: CRM for notification. See Telephone encounter for:   09/21/17.  Sharyn Lull Social Worker from [facility] called and said that he needs placement & they need an FL2 filled out.  [phone]

## 2017-09-22 DIAGNOSIS — E785 Hyperlipidemia, unspecified: Secondary | ICD-10-CM | POA: Diagnosis not present

## 2017-09-22 DIAGNOSIS — Z7982 Long term (current) use of aspirin: Secondary | ICD-10-CM | POA: Diagnosis not present

## 2017-09-22 DIAGNOSIS — E1122 Type 2 diabetes mellitus with diabetic chronic kidney disease: Secondary | ICD-10-CM | POA: Diagnosis not present

## 2017-09-22 DIAGNOSIS — N186 End stage renal disease: Secondary | ICD-10-CM | POA: Diagnosis not present

## 2017-09-22 DIAGNOSIS — E1142 Type 2 diabetes mellitus with diabetic polyneuropathy: Secondary | ICD-10-CM | POA: Diagnosis not present

## 2017-09-22 DIAGNOSIS — K5730 Diverticulosis of large intestine without perforation or abscess without bleeding: Secondary | ICD-10-CM | POA: Diagnosis not present

## 2017-09-22 DIAGNOSIS — I120 Hypertensive chronic kidney disease with stage 5 chronic kidney disease or end stage renal disease: Secondary | ICD-10-CM | POA: Diagnosis not present

## 2017-09-22 DIAGNOSIS — Z7984 Long term (current) use of oral hypoglycemic drugs: Secondary | ICD-10-CM | POA: Diagnosis not present

## 2017-09-22 DIAGNOSIS — Z9181 History of falling: Secondary | ICD-10-CM | POA: Diagnosis not present

## 2017-09-22 DIAGNOSIS — D631 Anemia in chronic kidney disease: Secondary | ICD-10-CM | POA: Diagnosis not present

## 2017-09-22 DIAGNOSIS — J45909 Unspecified asthma, uncomplicated: Secondary | ICD-10-CM | POA: Diagnosis not present

## 2017-09-23 ENCOUNTER — Emergency Department (HOSPITAL_COMMUNITY)
Admission: EM | Admit: 2017-09-23 | Discharge: 2017-09-23 | Disposition: A | Discharge status: Home / Self Care | Payer: Medicare Other | Attending: Emergency Medicine | Admitting: Emergency Medicine

## 2017-09-23 ENCOUNTER — Ambulatory Visit: Payer: Self-pay | Admitting: *Deleted

## 2017-09-23 ENCOUNTER — Other Ambulatory Visit: Payer: Self-pay

## 2017-09-23 DIAGNOSIS — I129 Hypertensive chronic kidney disease with stage 1 through stage 4 chronic kidney disease, or unspecified chronic kidney disease: Secondary | ICD-10-CM | POA: Insufficient documentation

## 2017-09-23 DIAGNOSIS — Z7982 Long term (current) use of aspirin: Secondary | ICD-10-CM | POA: Insufficient documentation

## 2017-09-23 DIAGNOSIS — Z79899 Other long term (current) drug therapy: Secondary | ICD-10-CM | POA: Insufficient documentation

## 2017-09-23 DIAGNOSIS — I120 Hypertensive chronic kidney disease with stage 5 chronic kidney disease or end stage renal disease: Secondary | ICD-10-CM | POA: Diagnosis not present

## 2017-09-23 DIAGNOSIS — R1310 Dysphagia, unspecified: Secondary | ICD-10-CM | POA: Insufficient documentation

## 2017-09-23 DIAGNOSIS — R030 Elevated blood-pressure reading, without diagnosis of hypertension: Secondary | ICD-10-CM | POA: Diagnosis not present

## 2017-09-23 DIAGNOSIS — K5730 Diverticulosis of large intestine without perforation or abscess without bleeding: Secondary | ICD-10-CM | POA: Diagnosis not present

## 2017-09-23 DIAGNOSIS — E1122 Type 2 diabetes mellitus with diabetic chronic kidney disease: Secondary | ICD-10-CM | POA: Diagnosis not present

## 2017-09-23 DIAGNOSIS — Z7984 Long term (current) use of oral hypoglycemic drugs: Secondary | ICD-10-CM | POA: Diagnosis not present

## 2017-09-23 DIAGNOSIS — F0390 Unspecified dementia without behavioral disturbance: Secondary | ICD-10-CM | POA: Diagnosis not present

## 2017-09-23 DIAGNOSIS — E785 Hyperlipidemia, unspecified: Secondary | ICD-10-CM | POA: Diagnosis not present

## 2017-09-23 DIAGNOSIS — E1142 Type 2 diabetes mellitus with diabetic polyneuropathy: Secondary | ICD-10-CM | POA: Diagnosis not present

## 2017-09-23 DIAGNOSIS — F17210 Nicotine dependence, cigarettes, uncomplicated: Secondary | ICD-10-CM | POA: Insufficient documentation

## 2017-09-23 DIAGNOSIS — D631 Anemia in chronic kidney disease: Secondary | ICD-10-CM | POA: Diagnosis not present

## 2017-09-23 DIAGNOSIS — N189 Chronic kidney disease, unspecified: Secondary | ICD-10-CM | POA: Insufficient documentation

## 2017-09-23 DIAGNOSIS — Z9181 History of falling: Secondary | ICD-10-CM | POA: Diagnosis not present

## 2017-09-23 DIAGNOSIS — N186 End stage renal disease: Secondary | ICD-10-CM | POA: Diagnosis not present

## 2017-09-23 DIAGNOSIS — J45909 Unspecified asthma, uncomplicated: Secondary | ICD-10-CM | POA: Diagnosis not present

## 2017-09-23 MED ORDER — DILTIAZEM HCL ER 120 MG PO CP12: 240.0000 mg | ORAL_CAPSULE | Freq: Two times a day (BID) | ORAL | 0 refills | Status: DC

## 2017-09-23 MED ORDER — METOPROLOL TARTRATE 50 MG PO TABS: 50.0000 mg | ORAL_TABLET | Freq: Two times a day (BID) | ORAL | 0 refills | Status: DC

## 2017-09-23 NOTE — ED Provider Notes (Signed)
[facility] DEPT Provider Note   CSN: [id number] Arrival date & time: 09/23/17  1316     History   Chief Complaint Chief Complaint  Patient presents with  . Hypertension    HPI Christopher Lopez is a 57 y.o. male.  HPI Patient is a 57 year old male who was brought to the emergency department after his occupational therapist noted that his blood pressure was elevated today.  Patient states he has had difficulty swallowing his blood pressure pills over the past several months.  He states he normally puts them in his mouth until they dissolve because when he tries to swallow them they sometimes feel like they get stuck.  He is able to drink fluids.  He was able to eat a sandwich yesterday.  He states he does have to chew his food extensively in order to get it to go down.  He is currently tolerating secretions.  He is never had an endoscopy or esophageal dilation.  No fevers or chills.  Denies chest pain shortness of breath.  Denies abdominal pain.  Denies weakness.  States he feels fine.  No headache.   Past Medical History:  Diagnosis Date  . Alcohol abuse 09/18/2011  . Allergic rhinitis, cause unspecified 09/20/2011  . Anemia, unspecified 09/18/2011  . Childhood asthma 09/20/2011  . Colitis 09/18/2011  . Dementia 09/18/2011  . Diabetes mellitus   . Diverticulosis of colon without hemorrhage 02/27/2014  . Foot ulcer ([place]) 09/18/2011  . HTN (hypertension) 09/18/2011  . Hyperlipidemia 11/08/2011  . Hypertension   . Impaired glucose tolerance 09/18/2011  . Pneumonia 09/18/2011  . Pre-ulcerative corn or callous 11/20/2011  . Type II or unspecified type diabetes mellitus without mention of complication, uncontrolled 09/20/2011    Patient Active Problem List   Diagnosis Date Noted  . Left leg swelling 02/02/2017  . Acute hearing loss, right 08/05/2016  . Left foot drop 03/05/2014  . Diverticulosis of colon without hemorrhage 02/27/2014  . CKD (chronic kidney disease)  02/27/2014  . Left knee pain 02/14/2014  . Nausea alone 02/13/2013  . Early satiety 02/13/2013  . Unspecified constipation 02/13/2013  . Pain of left calf 02/22/2012  . Calf pain, left 11/20/2011  . Pre-ulcerative corn or callous 11/20/2011  . Hyperlipidemia 11/08/2011  . Allergic rhinitis, cause unspecified 09/20/2011  . Diabetes ([place]) 09/20/2011  . Childhood asthma 09/20/2011  . Pneumonia 09/18/2011  . Anemia, unspecified 09/18/2011  . HTN (hypertension) 09/18/2011  . Colitis 09/18/2011  . Preventative health care 09/18/2011  . Dementia 09/18/2011  . Alcohol abuse 09/18/2011  . Foot ulcer ([place]) 09/18/2011    Past Surgical History:  Procedure Laterality Date  . TONSILLECTOMY  1985       Home Medications    Prior to Admission medications   Medication Sig Start Date End Date Taking? Authorizing Provider  aspirin EC 81 MG tablet Take 1 tablet (81 mg total) by mouth daily. 02/27/14   Biagio Borg, MD  Blood Glucose Monitoring Suppl (ONE TOUCH ULTRA 2) W/DEVICE KIT Use to check blood sugars daily Dx E11.9 11/01/14   Biagio Borg, MD  diltiazem (CARDIZEM SR) 120 MG 12 hr capsule Take 2 capsules (240 mg total) by mouth 2 (two) times daily. 09/23/17   Jola Schmidt, MD  Dulaglutide (TRULICITY) 2.72 ZD/6.6YQ SOPN Inject 0.5 mLs into the skin once a week. 09/13/17   Biagio Borg, MD  Fluconazole POWD 1 application by Does not apply route daily. 12/21/16   Landis Martins,  DPM  glucose blood test strip 1 each by Other route 2 (two) times daily. Use to check blood sugars twice a day Dx E11.9 11/01/14   Biagio Borg, MD  ketoconazole (NIZORAL) 2 % cream Apply 1 application topically daily. 04/12/17   Landis Martins, DPM  Lancets (ONETOUCH ULTRASOFT) lancets 1 each by Other route 2 (two) times daily. Use to check blood sugars twice a day Dx e11.9 11/01/14   Biagio Borg, MD  lovastatin (MEVACOR) 40 MG tablet TAKE 1 TABLET BY MOUTH  DAILY. 09/02/17   Biagio Borg, MD  metoprolol tartrate  (LOPRESSOR) 50 MG tablet Take 1 tablet (50 mg total) by mouth 2 (two) times daily. 09/23/17   Jola Schmidt, MD  Multiple Vitamin (MULTIVITAMIN) tablet Take 1 tablet by mouth daily.    [provider]  sitaGLIPtin (JANUVIA) 100 MG tablet Take 1 tablet (100 mg total) by mouth daily. 08/11/17 08/11/19  Biagio Borg, MD  urea (CARMOL) 10 % cream Apply topically as needed. 12/21/16   Stover, Titorya, DPM  urea (CARMOL) 40 % CREA Apply 1 application topically 2 (two) times daily. 12/21/16   Landis Martins, DPM    Family History Family History  Problem Relation Age of Onset  . Prostate cancer Paternal Grandfather   . Diabetes Mother   . Heart disease Mother   . Transient ischemic attack Mother   . Diabetes Sister   . Diabetes Maternal Grandfather   . Colon cancer Neg Hx     Social History Social History   Tobacco Use  . Smoking status: Current Every Day Smoker    Packs/day: 0.50    Types: Cigarettes  . Smokeless tobacco: Never Used  Substance Use Topics  . Alcohol use: Yes    Alcohol/week: 1.2 oz    Types: 2 Cans of beer per week  . Drug use: No     Allergies   Patient has no known allergies.   Review of Systems Review of Systems  All other systems reviewed and are negative.    Physical Exam Updated Vital Signs BP (!) 193/109   Pulse 94   Temp 98.4 F (36.9 C)   Resp 18   Ht 6' (1.829 m)   Wt 63.5 kg (140 lb)   SpO2 100%   BMI 18.99 kg/m   Physical Exam  Constitutional: He is oriented to person, place, and time. He appears well-developed and well-nourished.  HENT:  Head: Normocephalic and atraumatic.  Eyes: EOM are normal.  Neck: Normal range of motion.  Cardiovascular: Normal rate, regular rhythm, normal heart sounds and intact distal pulses.  Pulmonary/Chest: Effort normal and breath sounds normal. No respiratory distress.  Abdominal: Soft. He exhibits no distension. There is no tenderness.  Musculoskeletal: Normal range of motion.  Neurological:  He is alert and oriented to person, place, and time.  Skin: Skin is warm and dry.  Psychiatric: He has a normal mood and affect. Judgment normal.  Nursing note and vitals reviewed.    ED Treatments / Results  Labs (all labs ordered are listed, but only abnormal results are displayed) Labs Reviewed - No data to display  EKG  EKG Interpretation None       Radiology No results found.  Procedures Procedures (including critical care time)  Medications Ordered in ED Medications - No data to display   Initial Impression / Assessment and Plan / ED Course  I have reviewed the triage vital signs and the nursing notes.  Pertinent labs &  imaging results that were available during my care of the patient were reviewed by me and considered in my medical decision making (see chart for details).     Patient is overall well-appearing.  He will need outpatient GI follow-up and endoscopy.  The majority of his medications can be crushed except for his extended release blood pressure medicines.  I discussed these with pharmacy.  I will switch out his Cardizem and his metoprolol to short acting so they can be crushed and added applesauce.  He will continue outpatient follow-up with a gastroenterologist as he likely will benefit from endoscopy.  His primary care has been made aware as well via electronic medical record communication tools.  Patient understands return to the ER for new or worsening symptoms  Final Clinical Impressions(s) / ED Diagnoses   Final diagnoses:  Dysphagia, unspecified type    ED Discharge Orders        Ordered    metoprolol tartrate (LOPRESSOR) 50 MG tablet  2 times daily     09/23/17 1418    diltiazem (CARDIZEM SR) 120 MG 12 hr capsule  2 times daily     09/23/17 [place], Estanislado Surgeon, MD 09/23/17 [phone]

## 2017-09-23 NOTE — Telephone Encounter (Signed)
Christopher Lopez, OT from [facility] called stating that the pt BP is elevated; yesterday it was 140/68 per the home health nurse; today at 1150 it is 210/90 on left arm and 210/100 right arm at 1210; pt has not not had taking medication since Wednesday and he has trouble swallowing for at least a year but has been worsening for a few months; pt triaged to ED per nurse triage protocol; Christopher Lopez verbalizes understanding and will relay information to pt; spoke with Christopher Lopez at [facility] regarding this issue; also please call Christopher Lopez at [phone] for orders to cancel OT today; will alos route to office for notification of this encounter and pt triaged to ED Reason for Disposition . [8] Systolic BP  >= 309 OR Diastolic >= 407  AND [6] having NO cardiac or neurologic symptoms  Answer Assessment - Initial Assessment Questions 1. BLOOD PRESSURE: "What is the blood pressure?" "Did you take at least two measurements 5 minutes apart?"     210/100 right arm and 210/90 left arm   2. ONSET: "When did you take your blood pressure?"     Yes today at 1150 (left arm) and 1210 (right arm) 3. HOW: "How did you obtain the blood pressure?" (e.g., visiting nurse, automatic home BP monitor)     Manual BP cuff per OT 4. HISTORY: "Do you have a history of high blood pressure?"     yes 5. MEDICATIONS: "Are you taking any medications for blood pressure?" "Have you missed any doses recently?"     Yes not taken meds since Wed 09/21/17 6. OTHER SYMPTOMS: "Do you have any symptoms?" (e.g., headache, chest pain, blurred vision, difficulty breathing, weakness)     no 7. PREGNANCY: "Is there any chance you are pregnant?" "When was your last menstrual period?"     n/a  Protocols used: HIGH BLOOD PRESSURE-A-AH

## 2017-09-23 NOTE — ED Triage Notes (Signed)
Per [facility], patient comes from home. His occupational therapist called for htn and nausea. Bps 200/110 initially down to 178/110, hasnt taken medication past 4 days due to an increased difficulty swallowing pills. Progressively worse over last 4 months. Took one dose of crushed metroporol. Hx stroke 2011 which affected left side. A/o. 4 Zofran given.

## 2017-09-23 NOTE — Discharge Instructions (Signed)
Please crush your pills  Follow up with the gastroenterologist as discussed

## 2017-09-23 NOTE — ED Notes (Signed)
Bed: WA08 Expected date:  Expected time:  Means of arrival:  Comments: EMS 

## 2017-09-29 NOTE — Telephone Encounter (Signed)
Sharyn Lull called back stating she had not heard from [place]. Advised her of note below and that msg was left for her. She will talk to family and get an [facility] faxed over.

## 2017-09-30 DIAGNOSIS — E1142 Type 2 diabetes mellitus with diabetic polyneuropathy: Secondary | ICD-10-CM | POA: Diagnosis not present

## 2017-09-30 DIAGNOSIS — I120 Hypertensive chronic kidney disease with stage 5 chronic kidney disease or end stage renal disease: Secondary | ICD-10-CM | POA: Diagnosis not present

## 2017-09-30 DIAGNOSIS — E785 Hyperlipidemia, unspecified: Secondary | ICD-10-CM | POA: Diagnosis not present

## 2017-09-30 DIAGNOSIS — D631 Anemia in chronic kidney disease: Secondary | ICD-10-CM | POA: Diagnosis not present

## 2017-09-30 DIAGNOSIS — N186 End stage renal disease: Secondary | ICD-10-CM | POA: Diagnosis not present

## 2017-09-30 DIAGNOSIS — E1122 Type 2 diabetes mellitus with diabetic chronic kidney disease: Secondary | ICD-10-CM | POA: Diagnosis not present

## 2017-09-30 DIAGNOSIS — J45909 Unspecified asthma, uncomplicated: Secondary | ICD-10-CM | POA: Diagnosis not present

## 2017-09-30 DIAGNOSIS — Z7982 Long term (current) use of aspirin: Secondary | ICD-10-CM | POA: Diagnosis not present

## 2017-09-30 DIAGNOSIS — Z9181 History of falling: Secondary | ICD-10-CM | POA: Diagnosis not present

## 2017-09-30 DIAGNOSIS — Z7984 Long term (current) use of oral hypoglycemic drugs: Secondary | ICD-10-CM | POA: Diagnosis not present

## 2017-09-30 DIAGNOSIS — K5730 Diverticulosis of large intestine without perforation or abscess without bleeding: Secondary | ICD-10-CM | POA: Diagnosis not present

## 2017-10-04 NOTE — Telephone Encounter (Signed)
Sharyn Lull calling back, FL2 was faxed over on 09/29/17, need FL2 sent to [facility] [phone]

## 2017-10-04 NOTE — Telephone Encounter (Signed)
I do not spefically recall this, I think will need to be faxed again

## 2017-10-04 NOTE — Telephone Encounter (Signed)
I do not recall any FL2 for being faxed. I called Sharyn Lull back LVM providing main and side b fax numbers for her to send again.  Dr Jenny Reichmann do you recall seeing this form?

## 2017-10-06 ENCOUNTER — Telehealth: Payer: Self-pay | Admitting: Internal Medicine

## 2017-10-06 DIAGNOSIS — E785 Hyperlipidemia, unspecified: Secondary | ICD-10-CM | POA: Diagnosis not present

## 2017-10-06 DIAGNOSIS — K5730 Diverticulosis of large intestine without perforation or abscess without bleeding: Secondary | ICD-10-CM | POA: Diagnosis not present

## 2017-10-06 DIAGNOSIS — J45909 Unspecified asthma, uncomplicated: Secondary | ICD-10-CM | POA: Diagnosis not present

## 2017-10-06 DIAGNOSIS — N186 End stage renal disease: Secondary | ICD-10-CM | POA: Diagnosis not present

## 2017-10-06 DIAGNOSIS — D631 Anemia in chronic kidney disease: Secondary | ICD-10-CM | POA: Diagnosis not present

## 2017-10-06 DIAGNOSIS — E1122 Type 2 diabetes mellitus with diabetic chronic kidney disease: Secondary | ICD-10-CM | POA: Diagnosis not present

## 2017-10-06 DIAGNOSIS — Z9181 History of falling: Secondary | ICD-10-CM | POA: Diagnosis not present

## 2017-10-06 DIAGNOSIS — E1142 Type 2 diabetes mellitus with diabetic polyneuropathy: Secondary | ICD-10-CM | POA: Diagnosis not present

## 2017-10-06 DIAGNOSIS — I120 Hypertensive chronic kidney disease with stage 5 chronic kidney disease or end stage renal disease: Secondary | ICD-10-CM | POA: Diagnosis not present

## 2017-10-06 DIAGNOSIS — Z7984 Long term (current) use of oral hypoglycemic drugs: Secondary | ICD-10-CM | POA: Diagnosis not present

## 2017-10-06 DIAGNOSIS — Z7982 Long term (current) use of aspirin: Secondary | ICD-10-CM | POA: Diagnosis not present

## 2017-10-06 NOTE — Telephone Encounter (Signed)
FL2 was received and given to PCP. Once PCP has completed form and returns it to me it will be faxed to her attention.

## 2017-10-06 NOTE — Telephone Encounter (Signed)
Done hardcopy to shirron 

## 2017-10-06 NOTE — Telephone Encounter (Signed)
Copied from [place] [id number]. Topic: Quick Communication - Rx Refill/Question >> Oct 06, 2017 10:03 AM Celedonio Savage L wrote: Medication: glucose blood test strip Lancets (ONETOUCH ULTRASOFT) lancet  Blood Glucose Monitoring Suppl (ONE TOUCH ULTRA 2) W/DEVICE KIT      Has the patient contacted their pharmacy? Yes.   (Agent: If no, request that the patient contact the pharmacy for the refill.) Preferred Pharmacy (with phone number or street name):   [facility], [place] [phone] (Phone) [phone] (Fax)   Rudi Rummage from [facility] [phone] calling for pt to get these filled  Agent: Please be advised that RX refills may take up to 3 business days. We ask that you follow-up with your pharmacy.

## 2017-10-06 NOTE — Telephone Encounter (Signed)
Sharyn Lull called back and states the form has been faxed 7x, contact Sharyn Lull if needed, a website to get the form is [email or web address] look for "adult care home FL2 form". Sharyn Lull states you can give a call to her so that she can send it to a provided email if that's better, contact asap

## 2017-10-06 NOTE — Telephone Encounter (Signed)
Left message for Asha to call back regarding glucose supplies for patient. They have been sent to [facility]- we can send them to her if that is required.

## 2017-10-07 NOTE — Telephone Encounter (Signed)
Faxed

## 2017-10-11 ENCOUNTER — Telehealth: Payer: Self-pay | Admitting: Internal Medicine

## 2017-10-11 NOTE — Telephone Encounter (Signed)
Copied from [place] [phone]. Topic: Quick Communication - See Telephone Encounter >> Oct 11, 2017  1:56 PM Boyd Kerbs wrote: CRM for notification.   Verbal orders needed Placement for pt. Long term care  [facility] [phone]  See Telephone encounter for: 10/11/17.

## 2017-10-11 NOTE — Telephone Encounter (Signed)
Ok for verbals 

## 2017-10-12 NOTE — Telephone Encounter (Signed)
Notified Michelle w/MD response../lmb 

## 2017-10-13 DIAGNOSIS — N186 End stage renal disease: Secondary | ICD-10-CM | POA: Diagnosis not present

## 2017-10-13 DIAGNOSIS — E785 Hyperlipidemia, unspecified: Secondary | ICD-10-CM | POA: Diagnosis not present

## 2017-10-13 DIAGNOSIS — E1122 Type 2 diabetes mellitus with diabetic chronic kidney disease: Secondary | ICD-10-CM | POA: Diagnosis not present

## 2017-10-13 DIAGNOSIS — Z7982 Long term (current) use of aspirin: Secondary | ICD-10-CM | POA: Diagnosis not present

## 2017-10-13 DIAGNOSIS — Z7984 Long term (current) use of oral hypoglycemic drugs: Secondary | ICD-10-CM | POA: Diagnosis not present

## 2017-10-13 DIAGNOSIS — E1142 Type 2 diabetes mellitus with diabetic polyneuropathy: Secondary | ICD-10-CM | POA: Diagnosis not present

## 2017-10-13 DIAGNOSIS — K5730 Diverticulosis of large intestine without perforation or abscess without bleeding: Secondary | ICD-10-CM | POA: Diagnosis not present

## 2017-10-13 DIAGNOSIS — J45909 Unspecified asthma, uncomplicated: Secondary | ICD-10-CM | POA: Diagnosis not present

## 2017-10-13 DIAGNOSIS — D631 Anemia in chronic kidney disease: Secondary | ICD-10-CM | POA: Diagnosis not present

## 2017-10-13 DIAGNOSIS — I120 Hypertensive chronic kidney disease with stage 5 chronic kidney disease or end stage renal disease: Secondary | ICD-10-CM | POA: Diagnosis not present

## 2017-10-15 DIAGNOSIS — N186 End stage renal disease: Secondary | ICD-10-CM | POA: Diagnosis not present

## 2017-10-15 DIAGNOSIS — Z7982 Long term (current) use of aspirin: Secondary | ICD-10-CM | POA: Diagnosis not present

## 2017-10-15 DIAGNOSIS — J45909 Unspecified asthma, uncomplicated: Secondary | ICD-10-CM | POA: Diagnosis not present

## 2017-10-15 DIAGNOSIS — I120 Hypertensive chronic kidney disease with stage 5 chronic kidney disease or end stage renal disease: Secondary | ICD-10-CM | POA: Diagnosis not present

## 2017-10-15 DIAGNOSIS — E785 Hyperlipidemia, unspecified: Secondary | ICD-10-CM | POA: Diagnosis not present

## 2017-10-15 DIAGNOSIS — K5730 Diverticulosis of large intestine without perforation or abscess without bleeding: Secondary | ICD-10-CM | POA: Diagnosis not present

## 2017-10-15 DIAGNOSIS — D631 Anemia in chronic kidney disease: Secondary | ICD-10-CM | POA: Diagnosis not present

## 2017-10-15 DIAGNOSIS — Z7984 Long term (current) use of oral hypoglycemic drugs: Secondary | ICD-10-CM | POA: Diagnosis not present

## 2017-10-15 DIAGNOSIS — E1122 Type 2 diabetes mellitus with diabetic chronic kidney disease: Secondary | ICD-10-CM | POA: Diagnosis not present

## 2017-10-15 DIAGNOSIS — E1142 Type 2 diabetes mellitus with diabetic polyneuropathy: Secondary | ICD-10-CM | POA: Diagnosis not present

## 2017-10-18 ENCOUNTER — Ambulatory Visit: Payer: Self-pay | Admitting: *Deleted

## 2017-10-18 ENCOUNTER — Telehealth: Payer: Self-pay | Admitting: Internal Medicine

## 2017-10-18 ENCOUNTER — Ambulatory Visit: Payer: Self-pay

## 2017-10-18 DIAGNOSIS — E1122 Type 2 diabetes mellitus with diabetic chronic kidney disease: Secondary | ICD-10-CM | POA: Diagnosis not present

## 2017-10-18 DIAGNOSIS — K5730 Diverticulosis of large intestine without perforation or abscess without bleeding: Secondary | ICD-10-CM | POA: Diagnosis not present

## 2017-10-18 DIAGNOSIS — J45909 Unspecified asthma, uncomplicated: Secondary | ICD-10-CM | POA: Diagnosis not present

## 2017-10-18 DIAGNOSIS — Z7982 Long term (current) use of aspirin: Secondary | ICD-10-CM | POA: Diagnosis not present

## 2017-10-18 DIAGNOSIS — Z7984 Long term (current) use of oral hypoglycemic drugs: Secondary | ICD-10-CM | POA: Diagnosis not present

## 2017-10-18 DIAGNOSIS — E1142 Type 2 diabetes mellitus with diabetic polyneuropathy: Secondary | ICD-10-CM | POA: Diagnosis not present

## 2017-10-18 DIAGNOSIS — I120 Hypertensive chronic kidney disease with stage 5 chronic kidney disease or end stage renal disease: Secondary | ICD-10-CM | POA: Diagnosis not present

## 2017-10-18 DIAGNOSIS — N186 End stage renal disease: Secondary | ICD-10-CM | POA: Diagnosis not present

## 2017-10-18 DIAGNOSIS — D631 Anemia in chronic kidney disease: Secondary | ICD-10-CM | POA: Diagnosis not present

## 2017-10-18 DIAGNOSIS — E785 Hyperlipidemia, unspecified: Secondary | ICD-10-CM | POA: Diagnosis not present

## 2017-10-18 NOTE — Telephone Encounter (Signed)
Talked to the patient's mom this morning and she stated that the home health nurse checks her son's b/p and not sure when they are coming back. His b/p has been elevated over the weekend.  She states that he is not complaining of any thing, no headache, burred vision or cardiac symptoms. Asked her to tell the nurse to call us back if his b/p is elevated at the next check. She voiced understanding.

## 2017-10-18 NOTE — Telephone Encounter (Signed)
  Reason for Disposition . Systolic BP  >= 258 OR Diastolic >= 527  Answer Assessment - Initial Assessment Questions 1. BLOOD PRESSURE: "What is the blood pressure?" "Did you take at least two measurements 5 minutes apart?"     184/90 2. ONSET: "When did you take your blood pressure?"     0915 3. HOW: "How did you obtain the blood pressure?" (e.g., visiting nurse, automatic home BP monitor)     Manuel cuff 4. HISTORY: "Do you have a history of high blood pressure?"     Yes 5. MEDICATIONS: "Are you taking any medications for blood pressure?" "Have you missed any doses recently?"     Just took morning meds 6. OTHER SYMPTOMS: "Do you have any symptoms?" (e.g., headache, chest pain, blurred vision, difficulty breathing, weakness)     No 7. PREGNANCY: "Is there any chance you are pregnant?" "When was your last menstrual period?"     No  Protocols used: HIGH BLOOD PRESSURE-A-AH

## 2017-10-18 NOTE — Telephone Encounter (Signed)
Ok for next available?

## 2017-10-18 NOTE — Telephone Encounter (Signed)
Requesting an appointment for elevated BP. Pt. Taking medications as ordered. Asymptomatic with elevated BP.

## 2017-10-18 NOTE — Telephone Encounter (Signed)
Pt's mom called with wanting to make an appointment for a TB skin test. Appointment was made for this week.

## 2017-10-18 NOTE — Telephone Encounter (Signed)
Dr. Jenny Reichmann Please advise.  Tammy please schedule an OV in the meantime to make sure pt came be seen.

## 2017-10-18 NOTE — Telephone Encounter (Signed)
Noted. Thanks.

## 2017-10-18 NOTE — Telephone Encounter (Signed)
Copied from [place] [id number]. Topic: Quick Communication - See Telephone Encounter >> Oct 18, 2017  8:25 AM Boyd Kerbs wrote: CRM for notification.   Asha from [facility] [phone] Reporting BP (2:00) 180/106 ;  an hour later was 180/108 this Saturday 4/6 he says he was feeling well and did not;have any chest pain, dizziness, headaches,  he had mild swelling to lower extremities. Other than that he was fine.   She said he needs an appt. Will call back and follow up with him   See Telephone encounter for: 10/18/17.

## 2017-10-18 NOTE — Telephone Encounter (Signed)
Patient scheduled 4/10

## 2017-10-19 ENCOUNTER — Encounter: Payer: Self-pay | Admitting: Internal Medicine

## 2017-10-19 ENCOUNTER — Ambulatory Visit (INDEPENDENT_AMBULATORY_CARE_PROVIDER_SITE_OTHER): Payer: Medicare Other | Admitting: Internal Medicine

## 2017-10-19 ENCOUNTER — Ambulatory Visit: Payer: Medicare Other

## 2017-10-19 VITALS — BP 118/76 | HR 78 | Temp 97.6°F | Ht 72.0 in | Wt 168.0 lb

## 2017-10-19 DIAGNOSIS — I10 Essential (primary) hypertension: Secondary | ICD-10-CM

## 2017-10-19 DIAGNOSIS — E785 Hyperlipidemia, unspecified: Secondary | ICD-10-CM

## 2017-10-19 DIAGNOSIS — N183 Chronic kidney disease, stage 3 unspecified: Secondary | ICD-10-CM

## 2017-10-19 DIAGNOSIS — E119 Type 2 diabetes mellitus without complications: Secondary | ICD-10-CM

## 2017-10-19 MED ORDER — HYDROCHLOROTHIAZIDE 25 MG PO TABS: 25.0000 mg | ORAL_TABLET | Freq: Every day | ORAL | 3 refills | Status: DC

## 2017-10-19 NOTE — Patient Instructions (Signed)
Please take all new medication as prescribed - the fluid pill for blood pressure and swelling  Please continue all other medications as before, and refills have been done if requested.  Please have the pharmacy call with any other refills you may need.  Please continue your efforts at being more active, low cholesterol diet, and weight control.  Please keep your appointments with your specialists as you may have planned

## 2017-10-19 NOTE — Progress Notes (Signed)
Subjective:    Patient ID: Christopher Lopez, male    DOB: [birth year], 57 y.o.   MRN: [id number]  HPI  Here to f/u; overall doing ok,  Pt denies chest pain, increasing sob or doe, wheezing, orthopnea, PND, palpitations, dizziness or syncope though does have mild worsening LLE swelling better in the AM, worse in the evenings. .  Pt denies new neurological symptoms such as new headache, or facial or extremity weakness or numbness.  Pt denies polydipsia, polyuria, or low sugar episode.  Pt states overall good compliance with meds, mostly trying to follow appropriate diet, with wt overall stable,  but little exercise however. BP Readings from Last 3 Encounters:  10/19/17 118/76  09/23/17 (!) 193/109  08/11/17 132/88   Past Medical History:  Diagnosis Date  . Alcohol abuse 09/18/2011  . Allergic rhinitis, cause unspecified 09/20/2011  . Anemia, unspecified 09/18/2011  . Childhood asthma 09/20/2011  . Colitis 09/18/2011  . Dementia 09/18/2011  . Diabetes mellitus   . Diverticulosis of colon without hemorrhage 02/27/2014  . Foot ulcer ([place]) 09/18/2011  . HTN (hypertension) 09/18/2011  . Hyperlipidemia 11/08/2011  . Hypertension   . Impaired glucose tolerance 09/18/2011  . Pneumonia 09/18/2011  . Pre-ulcerative corn or callous 11/20/2011  . Type II or unspecified type diabetes mellitus without mention of complication, uncontrolled 09/20/2011   Past Surgical History:  Procedure Laterality Date  . TONSILLECTOMY  1985    reports that he has been smoking cigarettes.  He has been smoking about 0.50 packs per day. He has never used smokeless tobacco. He reports that he drinks about 1.2 oz of alcohol per week. He reports that he does not use drugs. family history includes Diabetes in his maternal grandfather, mother, and sister; Heart disease in his mother; Prostate cancer in his paternal grandfather; Transient ischemic attack in his mother. No Known Allergies Current Outpatient Medications on File Prior to Visit    Medication Sig Dispense Refill  . aspirin EC 81 MG tablet Take 1 tablet (81 mg total) by mouth daily. 90 tablet 11  . Blood Glucose Monitoring Suppl (ONE TOUCH ULTRA 2) W/DEVICE KIT Use to check blood sugars daily Dx E11.9 1 each 0  . diltiazem (CARDIZEM SR) 120 MG 12 hr capsule Take 2 capsules (240 mg total) by mouth 2 (two) times daily. 120 capsule 0  . Dulaglutide (TRULICITY) 4.00 QQ/7.6PP SOPN Inject 0.5 mLs into the skin once a week. 6 mL 3  . Fluconazole POWD 1 application by Does not apply route daily. 500 g 1  . glucose blood test strip 1 each by Other route 2 (two) times daily. Use to check blood sugars twice a day Dx E11.9 100 each 3  . ketoconazole (NIZORAL) 2 % cream Apply 1 application topically daily. 30 g 5  . Lancets (ONETOUCH ULTRASOFT) lancets 1 each by Other route 2 (two) times daily. Use to check blood sugars twice a day Dx e11.9 100 each 3  . lovastatin (MEVACOR) 40 MG tablet TAKE 1 TABLET BY MOUTH  DAILY. 90 tablet 2  . metoprolol tartrate (LOPRESSOR) 50 MG tablet Take 1 tablet (50 mg total) by mouth 2 (two) times daily. 60 tablet 0  . Multiple Vitamin (MULTIVITAMIN) tablet Take 1 tablet by mouth daily.    . sitaGLIPtin (JANUVIA) 100 MG tablet Take 1 tablet (100 mg total) by mouth daily. 90 tablet 3  . urea (CARMOL) 10 % cream Apply topically as needed. 71 g 0  . urea (CARMOL) 40 %  CREA Apply 1 application topically 2 (two) times daily. 1 each 3   No current facility-administered medications on file prior to visit.    Review of Systems  Constitutional: Negative for other unusual diaphoresis or sweats HENT: Negative for ear discharge or swelling Eyes: Negative for other worsening visual disturbances Respiratory: Negative for stridor or other swelling  Gastrointestinal: Negative for worsening distension or other blood Genitourinary: Negative for retention or other urinary change Musculoskeletal: Negative for other MSK pain or swelling Skin: Negative for color change  or other new lesions Neurological: Negative for worsening tremors and other numbness  Psychiatric/Behavioral: Negative for worsening agitation or other fatigue All other system neg per pt    Objective:   Physical Exam BP 118/76   Pulse 78   Temp 97.6 F (36.4 C) (Oral)   Ht 6' (1.829 m)   Wt 168 lb (76.2 kg)   SpO2 98%   BMI 22.78 kg/m  VS noted,  Constitutional: Pt appears in NAD HENT: Head: NCAT.  Right Ear: External ear normal.  Left Ear: External ear normal.  Eyes: . Pupils are equal, round, and reactive to light. Conjunctivae and EOM are normal Nose: without d/c or deformity Neck: Neck supple. Gross normal ROM Cardiovascular: Normal rate and regular rhythm.   Pulmonary/Chest: Effort normal and breath sounds without rales or wheezing.  Abd:  Soft, NT, ND, + BS, no organomegaly Neurological: Pt is alert. At baseline orientation, motor grossly intact Skin: Skin is warm. No rashes, other new lesions Psychiatric: Pt behavior is normal without agitation  Chronic LLE 1-2+ edema below the knee No other exam findings    Assessment & Plan:

## 2017-10-22 NOTE — Assessment & Plan Note (Signed)
Recently uncontrolled, for add hct for better BP control and LE edema BP Readings from Last 3 Encounters:  10/19/17 118/76  09/23/17 (!) 193/109  08/11/17 132/88

## 2017-10-22 NOTE — Assessment & Plan Note (Signed)
Lab Results  Component Value Date   LDLCALC 84 08/05/2016  stable overall by history and exam, recent data reviewed with pt, and pt to continue medical treatment as before,  to f/u any worsening symptoms or concerns

## 2017-10-22 NOTE — Assessment & Plan Note (Signed)
Lab Results  Component Value Date   CREATININE 4.82 ([place]) 08/11/2017  stable overall by history and exam, recent data reviewed with pt, and pt to continue medical treatment as before,  to f/u any worsening symptoms or concerns

## 2017-10-26 ENCOUNTER — Telehealth: Payer: Self-pay | Admitting: Internal Medicine

## 2017-10-26 DIAGNOSIS — Z7984 Long term (current) use of oral hypoglycemic drugs: Secondary | ICD-10-CM | POA: Diagnosis not present

## 2017-10-26 DIAGNOSIS — N186 End stage renal disease: Secondary | ICD-10-CM | POA: Diagnosis not present

## 2017-10-26 DIAGNOSIS — K5730 Diverticulosis of large intestine without perforation or abscess without bleeding: Secondary | ICD-10-CM | POA: Diagnosis not present

## 2017-10-26 DIAGNOSIS — D631 Anemia in chronic kidney disease: Secondary | ICD-10-CM | POA: Diagnosis not present

## 2017-10-26 DIAGNOSIS — I120 Hypertensive chronic kidney disease with stage 5 chronic kidney disease or end stage renal disease: Secondary | ICD-10-CM | POA: Diagnosis not present

## 2017-10-26 DIAGNOSIS — Z9181 History of falling: Secondary | ICD-10-CM | POA: Diagnosis not present

## 2017-10-26 DIAGNOSIS — Z7982 Long term (current) use of aspirin: Secondary | ICD-10-CM | POA: Diagnosis not present

## 2017-10-26 DIAGNOSIS — E1142 Type 2 diabetes mellitus with diabetic polyneuropathy: Secondary | ICD-10-CM | POA: Diagnosis not present

## 2017-10-26 DIAGNOSIS — E1122 Type 2 diabetes mellitus with diabetic chronic kidney disease: Secondary | ICD-10-CM | POA: Diagnosis not present

## 2017-10-26 DIAGNOSIS — E785 Hyperlipidemia, unspecified: Secondary | ICD-10-CM | POA: Diagnosis not present

## 2017-10-26 DIAGNOSIS — J45909 Unspecified asthma, uncomplicated: Secondary | ICD-10-CM | POA: Diagnosis not present

## 2017-10-26 MED ORDER — ONETOUCH ULTRA 2 W/DEVICE KIT: PACK | 0 refills | Status: DC

## 2017-10-26 MED ORDER — ONETOUCH ULTRASOFT LANCETS MISC: 1.0000 | Freq: Two times a day (BID) | 3 refills | Status: AC

## 2017-10-26 MED ORDER — GLUCOSE BLOOD VI STRP
ORAL_STRIP | 12 refills | Status: AC
Start: 2017-10-26 — End: ?

## 2017-10-26 NOTE — Telephone Encounter (Signed)
Ok, this is done 

## 2017-10-26 NOTE — Telephone Encounter (Signed)
Copied from [place] [id number] [id number]. Topic: Quick Communication - See Telephone Encounter >> Oct 26, 2017  4:02 PM Conception Chancy, NT wrote: CRM for notification. See Telephone encounter for: 10/26/17.  Joellen Jersey is calling from [facility] she is a Therapist, sports and states the patient needs a glucometer called in.  [facility] [place], [place] [address] [place] [address] Phone: [phone] Fax: [phone]

## 2017-10-27 ENCOUNTER — Encounter (HOSPITAL_COMMUNITY): Payer: Self-pay | Admitting: Emergency Medicine

## 2017-10-27 ENCOUNTER — Observation Stay (HOSPITAL_COMMUNITY)
Admission: EM | Admit: 2017-10-27 | Discharge: 2017-10-29 | Disposition: A | Discharge status: Home / Self Care | Payer: Medicare Other | Attending: Family Medicine | Admitting: Family Medicine

## 2017-10-27 DIAGNOSIS — R1111 Vomiting without nausea: Secondary | ICD-10-CM | POA: Diagnosis not present

## 2017-10-27 DIAGNOSIS — F17210 Nicotine dependence, cigarettes, uncomplicated: Secondary | ICD-10-CM | POA: Insufficient documentation

## 2017-10-27 DIAGNOSIS — Z7982 Long term (current) use of aspirin: Secondary | ICD-10-CM | POA: Insufficient documentation

## 2017-10-27 DIAGNOSIS — N185 Chronic kidney disease, stage 5: Secondary | ICD-10-CM | POA: Diagnosis present

## 2017-10-27 DIAGNOSIS — E1122 Type 2 diabetes mellitus with diabetic chronic kidney disease: Secondary | ICD-10-CM | POA: Diagnosis not present

## 2017-10-27 DIAGNOSIS — F1010 Alcohol abuse, uncomplicated: Secondary | ICD-10-CM | POA: Diagnosis present

## 2017-10-27 DIAGNOSIS — R197 Diarrhea, unspecified: Secondary | ICD-10-CM | POA: Insufficient documentation

## 2017-10-27 DIAGNOSIS — I10 Essential (primary) hypertension: Secondary | ICD-10-CM | POA: Diagnosis present

## 2017-10-27 DIAGNOSIS — R112 Nausea with vomiting, unspecified: Secondary | ICD-10-CM | POA: Diagnosis present

## 2017-10-27 DIAGNOSIS — N186 End stage renal disease: Secondary | ICD-10-CM

## 2017-10-27 DIAGNOSIS — R1110 Vomiting, unspecified: Secondary | ICD-10-CM | POA: Diagnosis not present

## 2017-10-27 DIAGNOSIS — I120 Hypertensive chronic kidney disease with stage 5 chronic kidney disease or end stage renal disease: Secondary | ICD-10-CM | POA: Diagnosis not present

## 2017-10-27 DIAGNOSIS — Z720 Tobacco use: Secondary | ICD-10-CM | POA: Diagnosis present

## 2017-10-27 DIAGNOSIS — I160 Hypertensive urgency: Secondary | ICD-10-CM | POA: Diagnosis not present

## 2017-10-27 DIAGNOSIS — E785 Hyperlipidemia, unspecified: Secondary | ICD-10-CM | POA: Diagnosis not present

## 2017-10-27 DIAGNOSIS — E1129 Type 2 diabetes mellitus with other diabetic kidney complication: Secondary | ICD-10-CM | POA: Diagnosis present

## 2017-10-27 DIAGNOSIS — E876 Hypokalemia: Secondary | ICD-10-CM | POA: Diagnosis not present

## 2017-10-27 DIAGNOSIS — F0390 Unspecified dementia without behavioral disturbance: Secondary | ICD-10-CM | POA: Insufficient documentation

## 2017-10-27 DIAGNOSIS — R030 Elevated blood-pressure reading, without diagnosis of hypertension: Secondary | ICD-10-CM | POA: Diagnosis not present

## 2017-10-27 LAB — COMPREHENSIVE METABOLIC PANEL
ALBUMIN: 3.5 g/dL (ref 3.5–5.0)
ALK PHOS: 56 U/L (ref 38–126)
ALT: 9 U/L — AB (ref 17–63)
ANION GAP: 12 (ref 5–15)
AST: 16 U/L (ref 15–41)
BILIRUBIN TOTAL: 0.9 mg/dL (ref 0.3–1.2)
BUN: 25 mg/dL — ABNORMAL HIGH (ref 6–20)
CALCIUM: 8.9 mg/dL (ref 8.9–10.3)
CO2: 25 mmol/L (ref 22–32)
CREATININE: 4.86 mg/dL — AB (ref 0.61–1.24)
Chloride: 100 mmol/L — ABNORMAL LOW (ref 101–111)
GFR calc Af Amer: 14 mL/min — ABNORMAL LOW (ref >60)
GFR calc non Af Amer: 12 mL/min — ABNORMAL LOW (ref >60)
GLUCOSE: 141 mg/dL — AB (ref 65–99)
Potassium: 3.4 mmol/L — ABNORMAL LOW (ref 3.5–5.1)
Sodium: 137 mmol/L (ref 135–145)
TOTAL PROTEIN: 7.5 g/dL (ref 6.5–8.1)

## 2017-10-27 LAB — CBC
HCT: 31.2 % — ABNORMAL LOW (ref 39.0–52.0)
HEMOGLOBIN: 10.4 g/dL — AB (ref 13.0–17.0)
MCH: 31.1 pg (ref 26.0–34.0)
MCHC: 33.3 g/dL (ref 30.0–36.0)
MCV: 93.4 fL (ref 78.0–100.0)
PLATELETS: 271 10*3/uL (ref 150–400)
RBC: 3.34 MIL/uL — ABNORMAL LOW (ref 4.22–5.81)
RDW: 15.2 % (ref 11.5–15.5)
WBC: 10.3 10*3/uL (ref 4.0–10.5)

## 2017-10-27 LAB — URINALYSIS, ROUTINE W REFLEX MICROSCOPIC
Bilirubin Urine: NEGATIVE
Glucose, UA: 50 mg/dL — AB
KETONES UR: NEGATIVE mg/dL
Leukocytes, UA: NEGATIVE
NITRITE: NEGATIVE
PH: 7 (ref 5.0–8.0)
Protein, ur: >=300 mg/dL — AB
SPECIFIC GRAVITY, URINE: 1.011 (ref 1.005–1.030)
Squamous Epithelial / LPF: NONE SEEN

## 2017-10-27 LAB — LIPASE, BLOOD: Lipase: 79 U/L — ABNORMAL HIGH (ref 11–51)

## 2017-10-27 NOTE — ED Triage Notes (Addendum)
Per EMS- pt has had nausea vomiting diarrhea since Monday, has not been able to take his meds for BP. Pt has HX of renal failure but chooses not to go on dialysis. Pt states 1 episode of vomiting and 1 episode of diarrhea in the past 24 hours. Denies abdominal pain. Pt has 20G to L hand placed by ems, 4 of zofran given.

## 2017-10-27 NOTE — Telephone Encounter (Signed)
See below

## 2017-10-28 ENCOUNTER — Emergency Department (HOSPITAL_COMMUNITY): Payer: Medicare Other

## 2017-10-28 ENCOUNTER — Other Ambulatory Visit: Payer: Self-pay

## 2017-10-28 ENCOUNTER — Encounter (HOSPITAL_COMMUNITY): Payer: Self-pay | Admitting: Surgery

## 2017-10-28 DIAGNOSIS — N185 Chronic kidney disease, stage 5: Secondary | ICD-10-CM | POA: Diagnosis not present

## 2017-10-28 DIAGNOSIS — I10 Essential (primary) hypertension: Secondary | ICD-10-CM | POA: Diagnosis not present

## 2017-10-28 DIAGNOSIS — F1010 Alcohol abuse, uncomplicated: Secondary | ICD-10-CM | POA: Diagnosis not present

## 2017-10-28 DIAGNOSIS — E876 Hypokalemia: Secondary | ICD-10-CM | POA: Diagnosis present

## 2017-10-28 DIAGNOSIS — R197 Diarrhea, unspecified: Secondary | ICD-10-CM | POA: Diagnosis not present

## 2017-10-28 DIAGNOSIS — R112 Nausea with vomiting, unspecified: Secondary | ICD-10-CM

## 2017-10-28 DIAGNOSIS — I161 Hypertensive emergency: Secondary | ICD-10-CM | POA: Diagnosis not present

## 2017-10-28 DIAGNOSIS — I160 Hypertensive urgency: Secondary | ICD-10-CM | POA: Diagnosis present

## 2017-10-28 DIAGNOSIS — R1111 Vomiting without nausea: Secondary | ICD-10-CM | POA: Diagnosis not present

## 2017-10-28 DIAGNOSIS — Z720 Tobacco use: Secondary | ICD-10-CM | POA: Diagnosis not present

## 2017-10-28 DIAGNOSIS — R1110 Vomiting, unspecified: Secondary | ICD-10-CM | POA: Diagnosis not present

## 2017-10-28 LAB — CBC
HCT: 28.5 % — ABNORMAL LOW (ref 39.0–52.0)
Hemoglobin: 9.3 g/dL — ABNORMAL LOW (ref 13.0–17.0)
MCH: 30.4 pg (ref 26.0–34.0)
MCHC: 32.6 g/dL (ref 30.0–36.0)
MCV: 93.1 fL (ref 78.0–100.0)
PLATELETS: 252 10*3/uL (ref 150–400)
RBC: 3.06 MIL/uL — AB (ref 4.22–5.81)
RDW: 14.8 % (ref 11.5–15.5)
WBC: 10.4 10*3/uL (ref 4.0–10.5)

## 2017-10-28 LAB — BASIC METABOLIC PANEL
Anion gap: 11 (ref 5–15)
BUN: 26 mg/dL — AB (ref 6–20)
CALCIUM: 8.6 mg/dL — AB (ref 8.9–10.3)
CHLORIDE: 103 mmol/L (ref 101–111)
CO2: 23 mmol/L (ref 22–32)
CREATININE: 4.49 mg/dL — AB (ref 0.61–1.24)
GFR calc non Af Amer: 13 mL/min — ABNORMAL LOW (ref >60)
GFR, EST AFRICAN AMERICAN: 15 mL/min — AB (ref >60)
Glucose, Bld: 150 mg/dL — ABNORMAL HIGH (ref 65–99)
Potassium: 3.2 mmol/L — ABNORMAL LOW (ref 3.5–5.1)
Sodium: 137 mmol/L (ref 135–145)

## 2017-10-28 LAB — GLUCOSE, CAPILLARY
Glucose-Capillary: 155 mg/dL — ABNORMAL HIGH (ref 65–99)
Glucose-Capillary: 230 mg/dL — ABNORMAL HIGH (ref 65–99)

## 2017-10-28 LAB — CBG MONITORING, ED
Glucose-Capillary: 138 mg/dL — ABNORMAL HIGH (ref 65–99)
Glucose-Capillary: 136 mg/dL — ABNORMAL HIGH (ref 65–99)

## 2017-10-28 MED ORDER — HYDRALAZINE HCL 20 MG/ML IJ SOLN: 5.0000 mg | INTRAMUSCULAR | Status: DC | PRN

## 2017-10-28 MED ORDER — LABETALOL HCL 200 MG PO TABS
100.0000 mg | ORAL_TABLET | Freq: Once | ORAL | Status: AC
  Administered 2017-10-28: 100 mg via ORAL
  Filled 2017-10-28: qty 1

## 2017-10-28 MED ORDER — METOPROLOL TARTRATE 25 MG PO TABS
50.0000 mg | ORAL_TABLET | Freq: Two times a day (BID) | ORAL | Status: DC
Start: 2017-10-28 — End: 2017-10-28

## 2017-10-28 MED ORDER — ACETAMINOPHEN 325 MG PO TABS: 650.0000 mg | ORAL_TABLET | Freq: Four times a day (QID) | ORAL | Status: DC | PRN

## 2017-10-28 MED ORDER — DILTIAZEM HCL ER 90 MG PO CP12
240.0000 mg | ORAL_CAPSULE | Freq: Two times a day (BID) | ORAL | Status: DC
  Administered 2017-10-28 – 2017-10-29 (×3): 240 mg via ORAL
  Filled 2017-10-28 (×4): qty 1

## 2017-10-28 MED ORDER — ZOLPIDEM TARTRATE 5 MG PO TABS: 5.0000 mg | ORAL_TABLET | Freq: Every evening | ORAL | Status: DC | PRN

## 2017-10-28 MED ORDER — ADULT MULTIVITAMIN W/MINERALS CH
1.0000 | ORAL_TABLET | Freq: Every day | ORAL | Status: DC
  Administered 2017-10-28 – 2017-10-29 (×2): 1 via ORAL
  Filled 2017-10-28 (×2): qty 1

## 2017-10-28 MED ORDER — PRAVASTATIN SODIUM 40 MG PO TABS
40.0000 mg | ORAL_TABLET | Freq: Every day | ORAL | Status: DC
  Administered 2017-10-28: 40 mg via ORAL
  Filled 2017-10-28: qty 1

## 2017-10-28 MED ORDER — HEPARIN SODIUM (PORCINE) 5000 UNIT/ML IJ SOLN
5000.0000 [IU] | Freq: Three times a day (TID) | INTRAMUSCULAR | Status: DC
  Administered 2017-10-28 – 2017-10-29 (×3): 5000 [IU] via SUBCUTANEOUS
  Filled 2017-10-28 (×3): qty 1

## 2017-10-28 MED ORDER — HYDRALAZINE HCL 20 MG/ML IJ SOLN
10.0000 mg | Freq: Once | INTRAMUSCULAR | Status: AC
  Administered 2017-10-28: 10 mg via INTRAVENOUS
  Filled 2017-10-28: qty 1

## 2017-10-28 MED ORDER — METOPROLOL TARTRATE 50 MG PO TABS
50.0000 mg | ORAL_TABLET | Freq: Two times a day (BID) | ORAL | Status: DC
  Administered 2017-10-28 – 2017-10-29 (×3): 50 mg via ORAL
  Filled 2017-10-28 (×2): qty 1
  Filled 2017-10-28: qty 2

## 2017-10-28 MED ORDER — INSULIN ASPART 100 UNIT/ML ~~LOC~~ SOLN
0.0000 [IU] | Freq: Three times a day (TID) | SUBCUTANEOUS | Status: DC
  Administered 2017-10-28: 1 [IU] via SUBCUTANEOUS
  Administered 2017-10-28 – 2017-10-29 (×2): 2 [IU] via SUBCUTANEOUS
  Administered 2017-10-29: 1 [IU] via SUBCUTANEOUS
  Filled 2017-10-28: qty 1

## 2017-10-28 MED ORDER — ONDANSETRON HCL 4 MG/2ML IJ SOLN
4.0000 mg | Freq: Once | INTRAMUSCULAR | Status: AC
  Administered 2017-10-28: 4 mg via INTRAVENOUS
  Filled 2017-10-28: qty 2

## 2017-10-28 MED ORDER — NICOTINE 21 MG/24HR TD PT24
21.0000 mg | MEDICATED_PATCH | Freq: Every day | TRANSDERMAL | Status: DC
  Administered 2017-10-28 – 2017-10-29 (×2): 21 mg via TRANSDERMAL
  Filled 2017-10-28 (×2): qty 1

## 2017-10-28 MED ORDER — ASPIRIN EC 81 MG PO TBEC
81.0000 mg | DELAYED_RELEASE_TABLET | Freq: Every day | ORAL | Status: DC
  Administered 2017-10-28 – 2017-10-29 (×2): 81 mg via ORAL
  Filled 2017-10-28 (×2): qty 1

## 2017-10-28 MED ORDER — ACETAMINOPHEN 650 MG RE SUPP: 650.0000 mg | Freq: Four times a day (QID) | RECTAL | Status: DC | PRN

## 2017-10-28 MED ORDER — SODIUM CHLORIDE 0.9 % IV BOLUS
500.0000 mL | Freq: Once | INTRAVENOUS | Status: AC
  Administered 2017-10-28: 500 mL via INTRAVENOUS

## 2017-10-28 MED ORDER — HYDROCHLOROTHIAZIDE 25 MG PO TABS: 25.0000 mg | ORAL_TABLET | Freq: Every day | ORAL | Status: DC

## 2017-10-28 MED ORDER — SODIUM CHLORIDE 0.9 % IV SOLN
INTRAVENOUS | Status: AC
  Administered 2017-10-28: 05:00:00 via INTRAVENOUS

## 2017-10-28 MED ORDER — INSULIN ASPART 100 UNIT/ML ~~LOC~~ SOLN
0.0000 [IU] | Freq: Every day | SUBCUTANEOUS | Status: DC
  Administered 2017-10-28: 2 [IU] via SUBCUTANEOUS

## 2017-10-28 MED ORDER — ONDANSETRON HCL 4 MG/2ML IJ SOLN
4.0000 mg | Freq: Three times a day (TID) | INTRAMUSCULAR | Status: DC | PRN
  Administered 2017-10-28: 4 mg via INTRAVENOUS
  Filled 2017-10-28: qty 2

## 2017-10-28 NOTE — ED Notes (Signed)
Patient transported to CT 

## 2017-10-28 NOTE — ED Provider Notes (Signed)
[facility] EMERGENCY DEPARTMENT Provider Note   CSN: [id number] Arrival date & time: 10/27/17  1809     History   Chief Complaint Chief Complaint  Patient presents with  . Emesis  . Diarrhea  . Hypertension    HPI Christopher Lopez is a 57 y.o. male.  Patient is a 57 year old male with past medical history of end-stage renal disease refusing dialysis, type 2 diabetes, dementia, colitis.  He presents today for evaluation of nausea, vomiting, and diarrhea that is worsened over the past several weeks.  He was started 1 week ago on a medicine for his kidneys which she believes may be the cause.  He denies any abdominal pain.  He denies any bloody stool, fevers, or chills.  The history is provided by the patient.  Diarrhea   This is a new problem. Episode onset: Several weeks ago. The problem occurs continuously. The problem has been gradually worsening. There has been no fever. Associated symptoms include vomiting. Pertinent negatives include no chills. He has tried nothing for the symptoms. The treatment provided no relief.    Past Medical History:  Diagnosis Date  . Alcohol abuse 09/18/2011  . Allergic rhinitis, cause unspecified 09/20/2011  . Anemia, unspecified 09/18/2011  . Childhood asthma 09/20/2011  . Colitis 09/18/2011  . Dementia 09/18/2011  . Diabetes mellitus   . Diverticulosis of colon without hemorrhage 02/27/2014  . Foot ulcer ([place]) 09/18/2011  . HTN (hypertension) 09/18/2011  . Hyperlipidemia 11/08/2011  . Hypertension   . Impaired glucose tolerance 09/18/2011  . Pneumonia 09/18/2011  . Pre-ulcerative corn or callous 11/20/2011  . Type II or unspecified type diabetes mellitus without mention of complication, uncontrolled 09/20/2011    Patient Active Problem List   Diagnosis Date Noted  . Left leg swelling 02/02/2017  . Acute hearing loss, right 08/05/2016  . Left foot drop 03/05/2014  . Diverticulosis of colon without hemorrhage 02/27/2014  . CKD (chronic  kidney disease) 02/27/2014  . Left knee pain 02/14/2014  . Nausea alone 02/13/2013  . Early satiety 02/13/2013  . Unspecified constipation 02/13/2013  . Pain of left calf 02/22/2012  . Calf pain, left 11/20/2011  . Pre-ulcerative corn or callous 11/20/2011  . Hyperlipidemia 11/08/2011  . Allergic rhinitis, cause unspecified 09/20/2011  . Diabetes ([place]) 09/20/2011  . Childhood asthma 09/20/2011  . Pneumonia 09/18/2011  . Anemia, unspecified 09/18/2011  . HTN (hypertension) 09/18/2011  . Colitis 09/18/2011  . Preventative health care 09/18/2011  . Dementia 09/18/2011  . Alcohol abuse 09/18/2011  . Foot ulcer ([place]) 09/18/2011    Past Surgical History:  Procedure Laterality Date  . TONSILLECTOMY  1985        Home Medications    Prior to Admission medications   Medication Sig Start Date End Date Taking? Authorizing Provider  aspirin EC 81 MG tablet Take 1 tablet (81 mg total) by mouth daily. 02/27/14   Biagio Borg, MD  Blood Glucose Monitoring Suppl (ONE TOUCH ULTRA 2) w/Device KIT Use to check blood sugars daily Dx E11.9 10/26/17   Biagio Borg, MD  diltiazem (CARDIZEM SR) 120 MG 12 hr capsule Take 2 capsules (240 mg total) by mouth 2 (two) times daily. 09/23/17   Jola Schmidt, MD  Dulaglutide (TRULICITY) 1.68 HF/2.9MS SOPN Inject 0.5 mLs into the skin once a week. 09/13/17   Biagio Borg, MD  Fluconazole POWD 1 application by Does not apply route daily. 12/21/16   Stover, Titorya, DPM  glucose blood (ONE TOUCH  ULTRA TEST) test strip Use as instructed once daily E11.9 10/26/17   Biagio Borg, MD  hydrochlorothiazide (HYDRODIURIL) 25 MG tablet Take 1 tablet (25 mg total) by mouth daily. 10/19/17   Biagio Borg, MD  ketoconazole (NIZORAL) 2 % cream Apply 1 application topically daily. 04/12/17   Landis Martins, DPM  Lancets (ONETOUCH ULTRASOFT) lancets 1 each by Other route 2 (two) times daily. Use to check blood sugars once a day Dx e11.9 10/26/17   Biagio Borg, MD  lovastatin  (MEVACOR) 40 MG tablet TAKE 1 TABLET BY MOUTH  DAILY. 09/02/17   Biagio Borg, MD  metoprolol tartrate (LOPRESSOR) 50 MG tablet Take 1 tablet (50 mg total) by mouth 2 (two) times daily. 09/23/17   Jola Schmidt, MD  Multiple Vitamin (MULTIVITAMIN) tablet Take 1 tablet by mouth daily.    [provider]  sitaGLIPtin (JANUVIA) 100 MG tablet Take 1 tablet (100 mg total) by mouth daily. 08/11/17 08/11/19  Biagio Borg, MD  urea (CARMOL) 10 % cream Apply topically as needed. 12/21/16   Stover, Titorya, DPM  urea (CARMOL) 40 % CREA Apply 1 application topically 2 (two) times daily. 12/21/16   Landis Martins, DPM    Family History Family History  Problem Relation Age of Onset  . Prostate cancer Paternal Grandfather   . Diabetes Mother   . Heart disease Mother   . Transient ischemic attack Mother   . Diabetes Sister   . Diabetes Maternal Grandfather   . Colon cancer Neg Hx     Social History Social History   Tobacco Use  . Smoking status: Current Every Day Smoker    Packs/day: 0.50    Types: Cigarettes  . Smokeless tobacco: Never Used  Substance Use Topics  . Alcohol use: Yes    Alcohol/week: 1.2 oz    Types: 2 Cans of beer per week  . Drug use: No     Allergies   Patient has no known allergies.   Review of Systems Review of Systems  Constitutional: Negative for chills.  Gastrointestinal: Positive for diarrhea and vomiting.  All other systems reviewed and are negative.    Physical Exam Updated Vital Signs BP (!) 192/110 (BP Location: Right Arm)   Pulse 72   Temp 98.9 F (37.2 C) (Oral)   Resp 16   Ht 6' (1.829 m)   Wt 72.6 kg (160 lb)   SpO2 100%   BMI 21.70 kg/m   Physical Exam  Constitutional: He is oriented to person, place, and time. He appears well-developed and well-nourished. No distress.  HENT:  Head: Normocephalic and atraumatic.  Mouth/Throat: Oropharynx is clear and moist.  Neck: Normal range of motion. Neck supple.  Cardiovascular: Normal  rate and regular rhythm. Exam reveals no friction rub.  No murmur heard. Pulmonary/Chest: Effort normal and breath sounds normal. No respiratory distress. He has no wheezes. He has no rales.  Abdominal: Soft. Bowel sounds are normal. He exhibits no distension. There is no tenderness.  Musculoskeletal: Normal range of motion. He exhibits no edema.  Neurological: He is alert and oriented to person, place, and time. Coordination normal.  Skin: Skin is warm and dry. He is not diaphoretic.  Nursing note and vitals reviewed.    ED Treatments / Results  Labs (all labs ordered are listed, but only abnormal results are displayed) Labs Reviewed  LIPASE, BLOOD - Abnormal; Notable for the following components:      Result Value   Lipase 79 (*)  All other components within normal limits  COMPREHENSIVE METABOLIC PANEL - Abnormal; Notable for the following components:   Potassium 3.4 (*)    Chloride 100 (*)    Glucose, Bld 141 (*)    BUN 25 (*)    Creatinine, Ser 4.86 (*)    ALT 9 (*)    GFR calc non Af Amer 12 (*)    GFR calc Af Amer 14 (*)    All other components within normal limits  CBC - Abnormal; Notable for the following components:   RBC 3.34 (*)    Hemoglobin 10.4 (*)    HCT 31.2 (*)    All other components within normal limits  URINALYSIS, ROUTINE W REFLEX MICROSCOPIC - Abnormal; Notable for the following components:   Glucose, UA 50 (*)    Hgb urine dipstick SMALL (*)    Protein, ur >=300 (*)    Bacteria, UA RARE (*)    All other components within normal limits    EKG None  Radiology No results found.  Procedures Procedures (including critical care time)  Medications Ordered in ED Medications  sodium chloride 0.9 % bolus 500 mL (has no administration in time range)  ondansetron (ZOFRAN) injection 4 mg (has no administration in time range)     Initial Impression / Assessment and Plan / ED Course  I have reviewed the triage vital signs and the nursing  notes.  Pertinent labs & imaging results that were available during my care of the patient were reviewed by me and considered in my medical decision making (see chart for details).  Patient presenting with increasing fatigue, weakness, fecal incontinence over the past several weeks.  He has a history of end-stage renal disease, however has refused dialysis.  Today his blood pressure is exceedingly high.  His renal function is consistent with prior study.  Due to the degree of his hypertension, I feel as though he should be admitted for further workup.  He was given labetalol with little improvement.  Dr. Blaine Hamper agrees to admit.  Final Clinical Impressions(s) / ED Diagnoses   Final diagnoses:  None    ED Discharge Orders    None       Veryl Speak, MD 10/28/17 [phone]

## 2017-10-28 NOTE — ED Notes (Signed)
Pt. Went to bathroom in urinal and poured urine into toilet.

## 2017-10-28 NOTE — ED Notes (Signed)
Printed EKG bc pt in hallway

## 2017-10-28 NOTE — ED Triage Notes (Signed)
PT reported nausea , PRN Zofran given IV

## 2017-10-28 NOTE — Progress Notes (Signed)
Patient admitted this am per Dr. Blaine Hamper.  Reviewed H&P and I have seen and examined the patient.  Christopher Lopez is a 57 y.o. male with medical history significant of hypertension, hyperlipidemia, diabetes mellitus, dementia, colitis, alcohol abuse in remission, tobacco abuse, CKD-5 (refused HD), who presents with nausea, vomiting and diarrhea over the past 5 days and was found to have blood pressure 222/115 due to inability to take his home medications.  He has been admitted with hypertensive urgency secondary to inability to take home medications due to intractable nausea and vomiting with diarrhea suspected secondary to viral gastroenteritis.  This is in the setting of stage V CKD with no plans for HD soon as patient refuses.  His blood pressure has demonstrated improvement thus far with measurement of 160/74 noted.  He is still quite nauseous and cannot tolerate much oral intake.  Continue Zofran as needed with no further IV fluid with potassium supplementation given late stage chronic kidney disease.  Advance diet as tolerated with repeat labs in a.m.  Patient would be eligible for discharge in a.m. if blood pressure well controlled with diet tolerated.

## 2017-10-28 NOTE — ED Notes (Signed)
ED Provider at bedside. Notified of BP.

## 2017-10-28 NOTE — ED Notes (Signed)
Pt back in hall from [place]

## 2017-10-28 NOTE — H&P (Signed)
History and Physical    [name] ERX:[id number] DOB: 04/15/1961 DOA: 10/27/2017  Referring MD/NP/PA:   PCP: Biagio Borg, MD   Patient coming from:  The patient is coming from home.  At baseline, pt is independent for most of ADL.   Chief Complaint: Nausea, vomiting, diarrhea  HPI: Christopher Lopez is a 57 y.o. male with medical history significant of hypertension, hyperlipidemia, diabetes mellitus, dementia, colitis, alcohol abuse in remission, tobacco abuse, CKD-5 (refused HD), who presents with nausea, vomiting and diarrhea.  Patient states that he has been having nausea, vomiting and diarrhea in the past 5 days. His diarrhea has improved, did not have diarrhea today. He still has nausea and vomiting, he vomited 3 times today. No blood in the vomitus. Patient denies abdominal pain. No fever or chills. He has a generalized weakness. Denies chest pain, shortness breath, cough. No symptoms of UTI or unilateral weakness. Patient states that he could not take his pressure medications due to severe nausea and vomiting. His blood pressure is elevated.  ED Course: pt was found to have blood pressure 222/115, negative urinalysis, lipase 79, renal function closely baseline, no fever, no tachycardia, no tachypnea, O2 sat 99% on room air. Pt is placed on tele bed for observation  # CT abdomen/pelvis showed: Mild left-sided perinephric stranding, with stranding along the proximal left ureter, concerning for left-sided pyelonephritis. No evidence of hydronephrosis.  Review of Systems:   General: no fevers, chills, no body weight gain, has poor appetite, has fatigue HEENT: no blurry vision, hearing changes or sore throat Respiratory: no dyspnea, coughing, wheezing CV: no chest pain, no palpitations GI: has nausea, vomiting, diarrhea, no constipation or abdominal pain, GU: no dysuria, burning on urination, increased urinary frequency, hematuria  Ext: no leg edema Neuro: no unilateral weakness,  numbness, or tingling, no vision change or hearing loss Skin: no rash, no skin tear. MSK: No muscle spasm, no deformity, no limitation of range of movement in spin Heme: No easy bruising.  Travel history: No recent long distant travel.  Allergy: No Known Allergies  Past Medical History:  Diagnosis Date  . Alcohol abuse 09/18/2011  . Allergic rhinitis, cause unspecified 09/20/2011  . Anemia, unspecified 09/18/2011  . Childhood asthma 09/20/2011  . Colitis 09/18/2011  . Dementia 09/18/2011  . Diabetes mellitus   . Diverticulosis of colon without hemorrhage 02/27/2014  . Foot ulcer ([place]) 09/18/2011  . HTN (hypertension) 09/18/2011  . Hyperlipidemia 11/08/2011  . Hypertension   . Impaired glucose tolerance 09/18/2011  . Pneumonia 09/18/2011  . Pre-ulcerative corn or callous 11/20/2011  . Type II or unspecified type diabetes mellitus without mention of complication, uncontrolled 09/20/2011    Past Surgical History:  Procedure Laterality Date  . TONSILLECTOMY  1985    Social History:  reports that he has been smoking cigarettes.  He has been smoking about 0.50 packs per day. He has never used smokeless tobacco. He reports that he drinks about 1.2 oz of alcohol per week. He reports that he does not use drugs.  Family History:  Family History  Problem Relation Age of Onset  . Prostate cancer Paternal Grandfather   . Diabetes Mother   . Heart disease Mother   . Transient ischemic attack Mother   . Diabetes Sister   . Diabetes Maternal Grandfather   . Colon cancer Neg Hx      Prior to Admission medications   Medication Sig Start Date End Date Taking? Authorizing Provider  aspirin EC 81  MG tablet Take 1 tablet (81 mg total) by mouth daily. 02/27/14  Yes Biagio Borg, MD  diltiazem (CARDIZEM SR) 120 MG 12 hr capsule Take 2 capsules (240 mg total) by mouth 2 (two) times daily. 09/23/17  Yes Jola Schmidt, MD  Dulaglutide (TRULICITY) 6.80 SU/1.1SR SOPN Inject 0.5 mLs into the skin once a week. 09/13/17   Yes Biagio Borg, MD  hydrochlorothiazide (HYDRODIURIL) 25 MG tablet Take 1 tablet (25 mg total) by mouth daily. 10/19/17  Yes Biagio Borg, MD  lovastatin (MEVACOR) 40 MG tablet TAKE 1 TABLET BY MOUTH  DAILY. 09/02/17  Yes Biagio Borg, MD  metoprolol tartrate (LOPRESSOR) 50 MG tablet Take 1 tablet (50 mg total) by mouth 2 (two) times daily. 09/23/17  Yes Jola Schmidt, MD  Multiple Vitamin (MULTIVITAMIN) tablet Take 1 tablet by mouth daily.   Yes [provider]  sitaGLIPtin (JANUVIA) 100 MG tablet Take 1 tablet (100 mg total) by mouth daily. 08/11/17 08/11/19 Yes Biagio Borg, MD  Blood Glucose Monitoring Suppl (ONE TOUCH ULTRA 2) w/Device KIT Use to check blood sugars daily Dx E11.9 10/26/17   Biagio Borg, MD  Fluconazole POWD 1 application by Does not apply route daily. Patient not taking: Reported on 10/28/2017 12/21/16   Landis Martins, DPM  glucose blood (ONE TOUCH ULTRA TEST) test strip Use as instructed once daily E11.9 10/26/17   Biagio Borg, MD  ketoconazole (NIZORAL) 2 % cream Apply 1 application topically daily. Patient not taking: Reported on 10/28/2017 04/12/17   Landis Martins, DPM  Lancets [facility] ULTRASOFT) lancets 1 each by Other route 2 (two) times daily. Use to check blood sugars once a day Dx e11.9 10/26/17   Biagio Borg, MD  urea (CARMOL) 10 % cream Apply topically as needed. Patient not taking: Reported on 10/28/2017 12/21/16   Landis Martins, DPM  urea (CARMOL) 40 % CREA Apply 1 application topically 2 (two) times daily. Patient not taking: Reported on 10/28/2017 12/21/16   Landis Martins, DPM    Physical Exam: Vitals:   10/28/17 0148 10/28/17 0149 10/28/17 0245 10/28/17 0424  BP: (!) 215/109 (!) 213/106 (!) 222/115 (!) 181/100  Pulse: 80  81 85  Resp: 18  18 16   Temp:      TempSrc:      SpO2: 100%  100% 98%  Weight:      Height:       General: Not in acute distress. Dry mucus and membrane HEENT:       Eyes: PERRL, EOMI, no scleral icterus.        ENT: No discharge from the ears and nose, no pharynx injection, no tonsillar enlargement.        Neck: No JVD, no bruit, no mass felt. Heme: No neck lymph node enlargement. Cardiac: S1/S2, RRR, No murmurs, No gallops or rubs. Respiratory:  No rales, wheezing, rhonchi or rubs. GI: Soft, nondistended, nontender, no rebound pain, no organomegaly, BS present. GU: No hematuria Ext: No pitting leg edema bilaterally. 2+DP/PT pulse bilaterally. Musculoskeletal: No joint deformities, No joint redness or warmth, no limitation of ROM in spin. Skin: No rashes.  Neuro: Alert, oriented X3, cranial nerves II-XII grossly intact, moves all extremities normally. Psych: Patient is not psychotic, no suicidal or hemocidal ideation.  Labs on Admission: I have personally reviewed following labs and imaging studies  CBC: Recent Labs  Lab 10/27/17 1828 10/28/17 0411  WBC 10.3 10.4  HGB 10.4* 9.3*  HCT 31.2* 28.5*  MCV  93.4 93.1  PLT 271 078   Basic Metabolic Panel: Recent Labs  Lab 10/27/17 1828 10/28/17 0411  NA 137 137  K 3.4* 3.2*  CL 100* 103  CO2 25 23  GLUCOSE 141* 150*  BUN 25* 26*  CREATININE 4.86* 4.49*  CALCIUM 8.9 8.6*   GFR: Estimated Creatinine Clearance: 18.4 mL/min (A) (by C-G formula based on SCr of 4.49 mg/dL (H)). Liver Function Tests: Recent Labs  Lab 10/27/17 1828  AST 16  ALT 9*  ALKPHOS 56  BILITOT 0.9  PROT 7.5  ALBUMIN 3.5   Recent Labs  Lab 10/27/17 1828  LIPASE 79*   No results for input(s): AMMONIA in the last 168 hours. Coagulation Profile: No results for input(s): INR, PROTIME in the last 168 hours. Cardiac Enzymes: No results for input(s): CKTOTAL, CKMB, CKMBINDEX, TROPONINI in the last 168 hours. BNP (last 3 results) No results for input(s): PROBNP in the last 8760 hours. HbA1C: No results for input(s): HGBA1C in the last 72 hours. CBG: No results for input(s): GLUCAP in the last 168 hours. Lipid Profile: No results for input(s): CHOL, HDL,  LDLCALC, TRIG, CHOLHDL, LDLDIRECT in the last 72 hours. Thyroid Function Tests: No results for input(s): TSH, T4TOTAL, FREET4, T3FREE, THYROIDAB in the last 72 hours. Anemia Panel: No results for input(s): VITAMINB12, FOLATE, FERRITIN, TIBC, IRON, RETICCTPCT in the last 72 hours. Urine analysis:    Component Value Date/Time   COLORURINE YELLOW 10/27/2017 1818   APPEARANCEUR CLEAR 10/27/2017 1818   LABSPEC 1.011 10/27/2017 1818   PHURINE 7.0 10/27/2017 1818   GLUCOSEU 50 (A) 10/27/2017 1818   GLUCOSEU NEGATIVE 08/11/2017 1153   HGBUR SMALL (A) 10/27/2017 1818   BILIRUBINUR NEGATIVE 10/27/2017 1818   KETONESUR NEGATIVE 10/27/2017 1818   PROTEINUR >=300 (A) 10/27/2017 1818   UROBILINOGEN 0.2 08/11/2017 1153   NITRITE NEGATIVE 10/27/2017 1818   LEUKOCYTESUR NEGATIVE 10/27/2017 1818   Sepsis Labs: @LABRCNTIP (procalcitonin:4,lacticidven:4) )No results found for this or any previous visit (from the past 240 hour(s)).   Radiological Exams on Admission: Ct Abdomen Pelvis Wo Contrast  Result Date: 10/28/2017 CLINICAL DATA:  Acute onset of nausea, vomiting and diarrhea. Fever. Chills. EXAM: CT ABDOMEN AND PELVIS WITHOUT CONTRAST TECHNIQUE: Multidetector CT imaging of the abdomen and pelvis was performed following the standard protocol without IV contrast. COMPARISON:  Renal ultrasound performed 08/10/2016 FINDINGS: Lower chest: The visualized lung bases are grossly clear. The visualized portions of the mediastinum are unremarkable. Hepatobiliary: The liver is unremarkable in appearance. The gallbladder is unremarkable in appearance. The common bile duct remains normal in caliber. Pancreas: The pancreas is within normal limits. Spleen: The spleen is unremarkable in appearance. Adrenals/Urinary Tract: The adrenal glands are unremarkable in appearance. Mild left-sided perinephric stranding is noted, with stranding along the proximal left ureter. This is concerning for left-sided pyelonephritis. There  is no evidence of hydronephrosis. No renal or ureteral stones are identified. Minimal right-sided perinephric stranding is noted. Stomach/Bowel: The stomach is unremarkable in appearance. The small bowel is within normal limits. The appendix is borderline normal in caliber, without evidence of appendicitis. The colon is unremarkable in appearance. Vascular/Lymphatic: Scattered calcification is seen along the abdominal aorta and its branches. The abdominal aorta is otherwise grossly unremarkable. The inferior vena cava is grossly unremarkable. No retroperitoneal lymphadenopathy is seen. No pelvic sidewall lymphadenopathy is identified. Reproductive: The bladder is mildly distended and grossly unremarkable in appearance. The prostate is normal in size. Other: No additional soft tissue abnormalities are seen. Musculoskeletal: No acute osseous  abnormalities are identified. Vacuum phenomenon is noted at L5-S1, with underlying facet disease. The visualized musculature is unremarkable in appearance. IMPRESSION: Mild left-sided perinephric stranding, with stranding along the proximal left ureter, concerning for left-sided pyelonephritis. No evidence of hydronephrosis. Aortic Atherosclerosis (ICD10-I70.0). Electronically Signed   By: Garald Balding M.D.   On: 10/28/2017 02:26     EKG:  Not done in ED, will get one.   Assessment/Plan Principal Problem:   Hypertensive urgency Active Problems:   HTN (hypertension)   Alcohol abuse   Type II diabetes mellitus with renal manifestations (HCC)   CKD (chronic kidney disease), stage V (HCC)   Tobacco abuse   Nausea vomiting and diarrhea   Hypokalemia   Hypertensive urgency: Bp 222/115. Due to not able to take blood pressure medications secondary to nausea and vomiting -will place on tele bed for obs -control his nausea and vomiting using Zofran, hoping patient can take oral medications -Continue home medications:Cardizem, metoprolol -Hold HCTZ since patient is  dehydrated -IV hydralazine prn  Nausea vomiting and diarrhea: no abdominal pain, fever and chills. Etiology is not clear, may be due to viral gastroenteritis. Diarrhea has improved. No diarrhea today. CT abdomen/pelvis is not impressive. It showed mild left-sided perinephric stranding, with stranding along the proximal left ureter, concerning for left-sided pyelonephritis. Since pt has negative urinalysis, no fever or leukocytosis, we not treat patient as a pyelonephritis. -Follow up urine culture -iV fluids: 500 mL normal saline bolus, then 75 mL per hour for 6 hours -When necessary Zofran for nausea  Alcohol abuse: -in remission  Type II diabetes mellitus with renal manifestations ([place]): Last A1c 6.1 on 08/11/17, well controled. Patient is taking trulicity and Januvia at home -SSI  CKD (chronic kidney disease), stage V ([place]): pt reached ESRD. He refused HD in the past and still refuses HD now. Cre is close to baseline. He had creatinine 4.82 on 08/11/17. His creatinine is 4.86, BUN 25. -f/u by BMP  Tobacco abuse: -Did counseling about importance of quitting smoking -Nicotine patch  Hypokalemia: mild. K=3.4 -will not treat due to ESRD -f/u by BMP    DVT ppx: SQ Heparin      Code Status: Full code Family Communication:   Yes, patient's 2 brothers at bed side Disposition Plan:  Anticipate discharge back to previous home environment Consults called:  none Admission status: Obs / tele     Date of Service 10/28/2017    Ivor Costa [facility] Pager [phone]  If 7PM-7AM, please contact night-coverage [email or web address] Password [email or web address] 10/28/2017, 5:03 AM

## 2017-10-28 NOTE — ED Notes (Signed)
ED Provider at bedside. 

## 2017-10-28 NOTE — ED Notes (Signed)
Pt. Has not used the bathroom to be able to collect urine culture

## 2017-10-29 DIAGNOSIS — N185 Chronic kidney disease, stage 5: Secondary | ICD-10-CM | POA: Diagnosis not present

## 2017-10-29 DIAGNOSIS — I161 Hypertensive emergency: Secondary | ICD-10-CM | POA: Diagnosis not present

## 2017-10-29 DIAGNOSIS — F1010 Alcohol abuse, uncomplicated: Secondary | ICD-10-CM | POA: Diagnosis not present

## 2017-10-29 DIAGNOSIS — R197 Diarrhea, unspecified: Secondary | ICD-10-CM | POA: Diagnosis not present

## 2017-10-29 DIAGNOSIS — I10 Essential (primary) hypertension: Secondary | ICD-10-CM | POA: Diagnosis not present

## 2017-10-29 DIAGNOSIS — R112 Nausea with vomiting, unspecified: Secondary | ICD-10-CM | POA: Diagnosis not present

## 2017-10-29 LAB — URINE CULTURE

## 2017-10-29 LAB — GLUCOSE, CAPILLARY
Glucose-Capillary: 134 mg/dL — ABNORMAL HIGH (ref 65–99)
Glucose-Capillary: 177 mg/dL — ABNORMAL HIGH (ref 65–99)

## 2017-10-29 LAB — BASIC METABOLIC PANEL
Anion gap: 13 (ref 5–15)
BUN: 24 mg/dL — AB (ref 6–20)
CHLORIDE: 103 mmol/L (ref 101–111)
CO2: 22 mmol/L (ref 22–32)
CREATININE: 4.32 mg/dL — AB (ref 0.61–1.24)
Calcium: 8.5 mg/dL — ABNORMAL LOW (ref 8.9–10.3)
GFR calc Af Amer: 16 mL/min — ABNORMAL LOW (ref >60)
GFR calc non Af Amer: 14 mL/min — ABNORMAL LOW (ref >60)
GLUCOSE: 104 mg/dL — AB (ref 65–99)
Potassium: 3.1 mmol/L — ABNORMAL LOW (ref 3.5–5.1)
Sodium: 138 mmol/L (ref 135–145)

## 2017-10-29 LAB — CBC
HCT: 30.4 % — ABNORMAL LOW (ref 39.0–52.0)
Hemoglobin: 10.1 g/dL — ABNORMAL LOW (ref 13.0–17.0)
MCH: 30.8 pg (ref 26.0–34.0)
MCHC: 33.2 g/dL (ref 30.0–36.0)
MCV: 92.7 fL (ref 78.0–100.0)
PLATELETS: 260 10*3/uL (ref 150–400)
RBC: 3.28 MIL/uL — AB (ref 4.22–5.81)
RDW: 14.6 % (ref 11.5–15.5)
WBC: 10.6 10*3/uL — ABNORMAL HIGH (ref 4.0–10.5)

## 2017-10-29 LAB — POTASSIUM: POTASSIUM: 3.9 mmol/L (ref 3.5–5.1)

## 2017-10-29 MED ORDER — POTASSIUM CHLORIDE 20 MEQ/15ML (10%) PO SOLN
40.0000 meq | Freq: Once | ORAL | Status: AC
  Administered 2017-10-29: 40 meq via ORAL
  Filled 2017-10-29: qty 30

## 2017-10-29 MED ORDER — ONDANSETRON HCL 4 MG PO TABS: 4.0000 mg | ORAL_TABLET | Freq: Three times a day (TID) | ORAL | 0 refills | Status: DC | PRN

## 2017-10-29 NOTE — Progress Notes (Signed)
Patient and family given discharge instructions and all questions answered.   

## 2017-10-29 NOTE — Discharge Summary (Signed)
Discharge Summary  Christopher Lopez MEQ:[id number] DOB: June 30, 1961  PCP: Biagio Borg, MD  Admit date: 10/27/2017 Discharge date: 10/29/2017  Time spent:>30 MINUTES   Recommendations for Outpatient Follow-up:  1. Care provider in 1-2 weeks.  Recheck of potassium  Discharge Diagnoses:  Active Hospital Problems   Diagnosis Date Noted  . Hypertensive urgency 10/28/2017  . Tobacco abuse 10/28/2017  . Nausea vomiting and diarrhea 10/28/2017  . Hypokalemia 10/28/2017  . CKD (chronic kidney disease), stage V ([place]) 02/27/2014  . Type II diabetes mellitus with renal manifestations ([place]) 09/20/2011  . HTN (hypertension) 09/18/2011  . Alcohol abuse 09/18/2011    Resolved Hospital Problems  No resolved problems to display.    Discharge Condition: Improved  Diet recommendation: Cardiac and diabetic diet  Vitals:   10/29/17 0834 10/29/17 1235  BP: (!) 154/79 (!) 169/89  Pulse: 60 (!) 58  Resp:  18  Temp:  98.4 F (36.9 C)  SpO2:  100%    History of present illness:   Christopher Rutigliano Townsendis a 57 y.o.malewith medical history significant ofhypertension, hyperlipidemia, diabetes mellitus, dementia, colitis, alcohol abuse in remission, tobacco abuse, CKD-5 (refused HD), who presents with nausea, vomiting and diarrhea over the past 5 days and was found to have blood pressure 222/115 due to inability to take his home medications.  He has been admitted with hypertensive urgency secondary to inability to take home medications due to intractable nausea and vomiting with diarrhea suspected secondary to viral gastroenteritis.  This is in the setting of stage V CKD with no plans for HD soon as patient refuses.  His blood pressure has demonstrated improvement thus far with measurement of 154/79 noted.  He is no longer nauseous and tolerating his regular food well.  He received a one-time dose of potassium 40 mEq which improved his potassium from 3.0-3.1-second dose of 40 mEq was given.  And repeat  labs this a.m was 3.9.  Patient is ready for discharge he is blood pressure much improved now that he can tolerate his p.o. medication.  His nephew who was at the bedside during my examination stated that patient is being taken care of by his 14 year old mother who also has some medical issues and that they could not take him back home.  They have started the process of placing him in an assisted living facility.  They stated that they had a bed already but as they were getting ready to conclude the process he ended up in the hospital and they lost the bed.  There were 2 brothers and in this nephew in the room but this had there where unable to take care of him because they work.  I have ordered home health care.  They will continue to attempt to place him in the assisted living facility through his primary care provider.  CT abdomen/pelvis is not impressive. It showed mild left-sided perinephric stranding, with stranding along the proximal left ureter, concerning for left-sided pyelonephritis. Since pt has negative urinalysis, no fever or leukocytosis, we  Did  not treat patient as a pyelonephritis.                Hospital Course:  Principal Problem:   Hypertensive urgency Active Problems:   HTN (hypertension)   Alcohol abuse   Type II diabetes mellitus with renal manifestations (HCC)   CKD (chronic kidney disease), stage V (HCC)   Tobacco abuse   Nausea vomiting and diarrhea   Hypokalemia   Procedures:  CT scan of  the abdomen  Consultations:  None  Discharge Exam: BP (!) 169/89 (BP Location: Left Arm)   Pulse (!) 58   Temp 98.4 F (36.9 C) (Oral)   Resp 18   Ht 6' (1.829 m)   Wt 71 kg (156 lb 8 oz) Comment: b scale  SpO2 100%   BMI 21.23 kg/m   General: Alert oriented x3 well-nourished Cardiovascular: Regular rate and rhythm no murmur, no edema Respiratory: No respiratory distress, normal effort CTA bilaterally  Discharge Instructions You were cared for by a  hospitalist during your hospital stay. If you have any questions about your discharge medications or the care you received while you were in the hospital after you are discharged, you can call the unit and asked to speak with the hospitalist on call if the hospitalist that took care of you is not available. Once you are discharged, your primary care physician will handle any further medical issues. Please note that NO REFILLS for any discharge medications will be authorized once you are discharged, as it is imperative that you return to your primary care physician (or establish a relationship with a primary care physician if you do not have one) for your aftercare needs so that they can reassess your need for medications and monitor your lab values.  Discharge Instructions    Call MD for:  persistant nausea and vomiting   Complete by:  As directed    Call MD for:  temperature >100.4   Complete by:  As directed    Diet - low sodium heart healthy   Complete by:  As directed    DIABETIC DIET AND POTASSIUM [place]   Discharge instructions   Complete by:  As directed    FOLLOW UP PCP IN 1 WEEK TO CHECK POTASSIUM LEVEL   Increase activity slowly   Complete by:  As directed      Allergies as of 10/29/2017   No Known Allergies     Medication List    STOP taking these medications   Fluconazole Powd   ketoconazole 2 % cream Commonly known as:  NIZORAL   urea 10 % cream Commonly known as:  CARMOL   urea 40 % Crea Commonly known as:  CARMOL     TAKE these medications   aspirin EC 81 MG tablet Take 1 tablet (81 mg total) by mouth daily.   diltiazem 120 MG 12 hr capsule Commonly known as:  CARDIZEM SR Take 2 capsules (240 mg total) by mouth 2 (two) times daily.   Dulaglutide 0.75 MG/0.5ML Sopn Commonly known as:  TRULICITY Inject 0.5 mLs into the skin once a week.   glucose blood test strip Commonly known as:  ONE TOUCH ULTRA TEST Use as instructed once daily E11.9     hydrochlorothiazide 25 MG tablet Commonly known as:  HYDRODIURIL Take 1 tablet (25 mg total) by mouth daily.   lovastatin 40 MG tablet Commonly known as:  MEVACOR TAKE 1 TABLET BY MOUTH  DAILY.   metoprolol tartrate 50 MG tablet Commonly known as:  LOPRESSOR Take 1 tablet (50 mg total) by mouth 2 (two) times daily.   multivitamin tablet Take 1 tablet by mouth daily.   ondansetron 4 MG tablet Commonly known as:  ZOFRAN Take 1 tablet (4 mg total) by mouth every 8 (eight) hours as needed for nausea or vomiting.   ONE TOUCH ULTRA 2 w/Device Kit Use to check blood sugars daily Dx E11.9   onetouch ultrasoft lancets 1  each by Other route 2 (two) times daily. Use to check blood sugars once a day Dx e11.9   sitaGLIPtin 100 MG tablet Commonly known as:  JANUVIA Take 1 tablet (100 mg total) by mouth daily.      No Known Allergies    The results of significant diagnostics from this hospitalization (including imaging, microbiology, ancillary and laboratory) are listed below for reference.    Significant Diagnostic Studies: Ct Abdomen Pelvis Wo Contrast  Result Date: 10/28/2017 CLINICAL DATA:  Acute onset of nausea, vomiting and diarrhea. Fever. Chills. EXAM: CT ABDOMEN AND PELVIS WITHOUT CONTRAST TECHNIQUE: Multidetector CT imaging of the abdomen and pelvis was performed following the standard protocol without IV contrast. COMPARISON:  Renal ultrasound performed 08/10/2016 FINDINGS: Lower chest: The visualized lung bases are grossly clear. The visualized portions of the mediastinum are unremarkable. Hepatobiliary: The liver is unremarkable in appearance. The gallbladder is unremarkable in appearance. The common bile duct remains normal in caliber. Pancreas: The pancreas is within normal limits. Spleen: The spleen is unremarkable in appearance. Adrenals/Urinary Tract: The adrenal glands are unremarkable in appearance. Mild left-sided perinephric stranding is noted, with stranding along  the proximal left ureter. This is concerning for left-sided pyelonephritis. There is no evidence of hydronephrosis. No renal or ureteral stones are identified. Minimal right-sided perinephric stranding is noted. Stomach/Bowel: The stomach is unremarkable in appearance. The small bowel is within normal limits. The appendix is borderline normal in caliber, without evidence of appendicitis. The colon is unremarkable in appearance. Vascular/Lymphatic: Scattered calcification is seen along the abdominal aorta and its branches. The abdominal aorta is otherwise grossly unremarkable. The inferior vena cava is grossly unremarkable. No retroperitoneal lymphadenopathy is seen. No pelvic sidewall lymphadenopathy is identified. Reproductive: The bladder is mildly distended and grossly unremarkable in appearance. The prostate is normal in size. Other: No additional soft tissue abnormalities are seen. Musculoskeletal: No acute osseous abnormalities are identified. Vacuum phenomenon is noted at L5-S1, with underlying facet disease. The visualized musculature is unremarkable in appearance. IMPRESSION: Mild left-sided perinephric stranding, with stranding along the proximal left ureter, concerning for left-sided pyelonephritis. No evidence of hydronephrosis. Aortic Atherosclerosis (ICD10-I70.0). Electronically Signed   By: Garald Balding M.D.   On: 10/28/2017 02:26    Microbiology: Recent Results (from the past 240 hour(s))  Urine Culture     Status: Abnormal   Collection Time: 10/28/17  3:37 AM  Result Value Ref Range Status   Specimen Description URINE, RANDOM  Final   Special Requests   Final    NONE Performed at [facility], [address], [place], [place] [address]    Culture MULTIPLE SPECIES PRESENT, SUGGEST RECOLLECTION (A)  Final   Report Status 10/29/2017 FINAL  Final     Labs: Basic Metabolic Panel: Recent Labs  Lab 10/27/17 1828 10/28/17 0411 10/29/17 0625 10/29/17 1213  NA 137 137 138  --    K 3.4* 3.2* 3.1* 3.9  CL 100* 103 103  --   CO2 25 23 22   --   GLUCOSE 141* 150* 104*  --   BUN 25* 26* 24*  --   CREATININE 4.86* 4.49* 4.32*  --   CALCIUM 8.9 8.6* 8.5*  --    Liver Function Tests: Recent Labs  Lab 10/27/17 1828  AST 16  ALT 9*  ALKPHOS 56  BILITOT 0.9  PROT 7.5  ALBUMIN 3.5   Recent Labs  Lab 10/27/17 1828  LIPASE 79*   No results for input(s): AMMONIA in the last  168 hours. CBC: Recent Labs  Lab 10/27/17 1828 10/28/17 0411 10/29/17 0625  WBC 10.3 10.4 10.6*  HGB 10.4* 9.3* 10.1*  HCT 31.2* 28.5* 30.4*  MCV 93.4 93.1 92.7  PLT 271 252 260   Cardiac Enzymes: No results for input(s): CKTOTAL, CKMB, CKMBINDEX, TROPONINI in the last 168 hours. BNP: BNP (last 3 results) No results for input(s): BNP in the last 8760 hours.  ProBNP (last 3 results) No results for input(s): PROBNP in the last 8760 hours.  CBG: Recent Labs  Lab 10/28/17 1324 10/28/17 1605 10/28/17 2108 10/29/17 0812 10/29/17 1127  GLUCAP 136* 155* 230* 134* 177*       Signed:  Cristal Deer, MD [facility] 10/29/2017, 2:05 PM

## 2017-10-29 NOTE — Care Management Note (Addendum)
Case Management Note  Patient Details  Name: Christopher Lopez MRN: [id number] Date of Birth: 11-24-1960  Subjective/Objective:     Pt from home with mother and is active with [facility] RN, NA and Ruston from [place] is working toward getting pt placed in a facility.             Action/Plan: Will notify [facility] upon d/c.  MD may place [facility] orders/F2F to resume services.   Expected Discharge Date:  10/29/17               Expected Discharge Plan:  [place]  In-House Referral:  NA  Discharge planning Services  CM Consult  Post Acute Care Choice:  Home Health, Resumption of Svcs/PTA Provider Choice offered to:  Parent  DME Arranged:  N/A DME Agency:  NA  HH Arranged:  RN, NA, Social Work [employer] Agency:  [facility]  Status of Service:  Completed, signed off  If discussed at [employer], dates discussed:    Additional Comments:  Update [place]- Ladell Heads, RN 10/29/2017, 1:03 PM

## 2017-11-01 ENCOUNTER — Ambulatory Visit: Payer: Medicare Other | Admitting: Sports Medicine

## 2017-11-17 ENCOUNTER — Telehealth: Payer: Self-pay | Admitting: Internal Medicine

## 2017-11-17 NOTE — Telephone Encounter (Signed)
Copied from [place] [phone]. Topic: Quick Communication - See Telephone Encounter >> Nov 17, 2017  9:13 AM Percell Belt A wrote: CRM for notification. See Telephone encounter for: 11/17/17.   Pt mother called in stated that vein and vascular can not get pt in til June to put port in and dialysis is needing him to get it put in before then?  Is there anywhere else that he could be referred too?    Best number [phone]

## 2017-11-22 ENCOUNTER — Emergency Department (HOSPITAL_COMMUNITY)
Admission: EM | Admit: 2017-11-22 | Discharge: 2017-11-22 | Disposition: A | Discharge status: Home / Self Care | Payer: Medicare Other | Attending: Emergency Medicine | Admitting: Emergency Medicine

## 2017-11-22 ENCOUNTER — Encounter (HOSPITAL_COMMUNITY): Payer: Self-pay | Admitting: Emergency Medicine

## 2017-11-22 ENCOUNTER — Other Ambulatory Visit: Payer: Self-pay

## 2017-11-22 DIAGNOSIS — Z79899 Other long term (current) drug therapy: Secondary | ICD-10-CM | POA: Diagnosis not present

## 2017-11-22 DIAGNOSIS — I120 Hypertensive chronic kidney disease with stage 5 chronic kidney disease or end stage renal disease: Secondary | ICD-10-CM | POA: Diagnosis not present

## 2017-11-22 DIAGNOSIS — E1122 Type 2 diabetes mellitus with diabetic chronic kidney disease: Secondary | ICD-10-CM | POA: Diagnosis not present

## 2017-11-22 DIAGNOSIS — F0390 Unspecified dementia without behavioral disturbance: Secondary | ICD-10-CM | POA: Insufficient documentation

## 2017-11-22 DIAGNOSIS — Z7984 Long term (current) use of oral hypoglycemic drugs: Secondary | ICD-10-CM | POA: Diagnosis not present

## 2017-11-22 DIAGNOSIS — Z7982 Long term (current) use of aspirin: Secondary | ICD-10-CM | POA: Diagnosis not present

## 2017-11-22 DIAGNOSIS — N185 Chronic kidney disease, stage 5: Secondary | ICD-10-CM

## 2017-11-22 DIAGNOSIS — F17210 Nicotine dependence, cigarettes, uncomplicated: Secondary | ICD-10-CM | POA: Diagnosis not present

## 2017-11-22 DIAGNOSIS — R531 Weakness: Secondary | ICD-10-CM | POA: Diagnosis present

## 2017-11-22 LAB — CBC WITH DIFFERENTIAL/PLATELET
ABS IMMATURE GRANULOCYTES: 0 10*3/uL (ref 0.0–0.1)
BASOS PCT: 1 %
Basophils Absolute: 0.1 10*3/uL (ref 0.0–0.1)
EOS PCT: 2 %
Eosinophils Absolute: 0.2 10*3/uL (ref 0.0–0.7)
HCT: 31.9 % — ABNORMAL LOW (ref 39.0–52.0)
HEMOGLOBIN: 10.4 g/dL — AB (ref 13.0–17.0)
Immature Granulocytes: 0 %
LYMPHS ABS: 1.6 10*3/uL (ref 0.7–4.0)
Lymphocytes Relative: 16 %
MCH: 31.5 pg (ref 26.0–34.0)
MCHC: 32.6 g/dL (ref 30.0–36.0)
MCV: 96.7 fL (ref 78.0–100.0)
Monocytes Absolute: 0.4 10*3/uL (ref 0.1–1.0)
Monocytes Relative: 4 %
NEUTROS PCT: 77 %
Neutro Abs: 7.3 10*3/uL (ref 1.7–7.7)
Platelets: 315 10*3/uL (ref 150–400)
RBC: 3.3 MIL/uL — AB (ref 4.22–5.81)
RDW: 14.8 % (ref 11.5–15.5)
WBC: 9.6 10*3/uL (ref 4.0–10.5)

## 2017-11-22 LAB — URINALYSIS, ROUTINE W REFLEX MICROSCOPIC
Bilirubin Urine: NEGATIVE
Glucose, UA: 50 mg/dL — AB
HGB URINE DIPSTICK: NEGATIVE
KETONES UR: NEGATIVE mg/dL
Leukocytes, UA: NEGATIVE
NITRITE: NEGATIVE
Protein, ur: >=300 mg/dL — AB
SPECIFIC GRAVITY, URINE: 1.011 (ref 1.005–1.030)
pH: 6 (ref 5.0–8.0)

## 2017-11-22 LAB — COMPREHENSIVE METABOLIC PANEL
ALK PHOS: 53 U/L (ref 38–126)
ALT: 9 U/L — AB (ref 17–63)
AST: 14 U/L — AB (ref 15–41)
Albumin: 3.6 g/dL (ref 3.5–5.0)
Anion gap: 11 (ref 5–15)
BUN: 41 mg/dL — AB (ref 6–20)
CALCIUM: 9.3 mg/dL (ref 8.9–10.3)
CO2: 24 mmol/L (ref 22–32)
CREATININE: 6.19 mg/dL — AB (ref 0.61–1.24)
Chloride: 103 mmol/L (ref 101–111)
GFR calc Af Amer: 10 mL/min — ABNORMAL LOW (ref >60)
GFR calc non Af Amer: 9 mL/min — ABNORMAL LOW (ref >60)
GLUCOSE: 170 mg/dL — AB (ref 65–99)
Potassium: 4.2 mmol/L (ref 3.5–5.1)
SODIUM: 138 mmol/L (ref 135–145)
Total Bilirubin: 0.3 mg/dL (ref 0.3–1.2)
Total Protein: 7.7 g/dL (ref 6.5–8.1)

## 2017-11-22 MED ORDER — METOPROLOL TARTRATE 25 MG PO TABS
50.0000 mg | ORAL_TABLET | Freq: Two times a day (BID) | ORAL | Status: DC
  Administered 2017-11-22: 50 mg via ORAL
  Filled 2017-11-22: qty 2

## 2017-11-22 MED ORDER — HYDRALAZINE HCL 20 MG/ML IJ SOLN
10.0000 mg | Freq: Once | INTRAMUSCULAR | Status: AC
  Administered 2017-11-22: 10 mg via INTRAVENOUS
  Filled 2017-11-22: qty 1

## 2017-11-22 MED ORDER — ONDANSETRON HCL 4 MG PO TABS: 4.0000 mg | ORAL_TABLET | Freq: Three times a day (TID) | ORAL | Status: DC | PRN

## 2017-11-22 MED ORDER — HYDROCHLOROTHIAZIDE 25 MG PO TABS
25.0000 mg | ORAL_TABLET | Freq: Every day | ORAL | Status: DC
  Administered 2017-11-22: 25 mg via ORAL
  Filled 2017-11-22: qty 1

## 2017-11-22 MED ORDER — DILTIAZEM HCL ER 60 MG PO CP12
240.0000 mg | ORAL_CAPSULE | Freq: Two times a day (BID) | ORAL | Status: DC
  Administered 2017-11-22: 240 mg via ORAL
  Filled 2017-11-22: qty 1

## 2017-11-22 MED ORDER — HYDRALAZINE HCL 25 MG PO TABS
50.0000 mg | ORAL_TABLET | Freq: Two times a day (BID) | ORAL | Status: DC
  Administered 2017-11-22: 50 mg via ORAL
  Filled 2017-11-22: qty 2

## 2017-11-22 NOTE — Telephone Encounter (Signed)
Yes, that would be best, for pt or wife to contact dialysis or other provider involved in making the referral mentioned

## 2017-11-22 NOTE — Telephone Encounter (Signed)
We did not refer pt to [facility]. Pt probably needs to contact dialysis provider.

## 2017-11-22 NOTE — ED Triage Notes (Addendum)
Pt states nausea since yesterday with generalized weakness. Family states the pt was supposed to get dialysis graft placed in February but refused. Denies cough, fevers, pain anywhere, diarrhea or urinary symptoms. Denies blurred vision. Pt alert to self, thinks this is April. Pt at baseline per family.

## 2017-11-22 NOTE — ED Provider Notes (Signed)
[facility] EMERGENCY DEPARTMENT Provider Note   CSN: [id number] Arrival date & time: 11/22/17  1009     History   Chief Complaint Chief Complaint  Patient presents with  . Weakness  . Nausea    HPI Christopher Lopez is a 57 y.o. male with a history of DM 2, CKD 5 previously refusing HD, who presents today for evaluation of dialysis.  He was supposed to have a dialysis graft placed in February but he refused.  He denies abdominal pain, chest pain, diarrhea, urinary symptoms or blurred vision. He has previously seen [facility] Dr. Lorrene Reid.  He does not have a dialysis catheter or fistula in place.  Of note he has not taken any of his medicines today including all his antihypertensives.   HPI  Past Medical History:  Diagnosis Date  . Alcohol abuse 09/18/2011  . Allergic rhinitis, cause unspecified 09/20/2011  . Anemia, unspecified 09/18/2011  . Childhood asthma 09/20/2011  . Colitis 09/18/2011  . Dementia 09/18/2011  . Diabetes mellitus   . Diverticulosis of colon without hemorrhage 02/27/2014  . Foot ulcer ([place]) 09/18/2011  . HTN (hypertension) 09/18/2011  . Hyperlipidemia 11/08/2011  . Hypertension   . Impaired glucose tolerance 09/18/2011  . Pneumonia 09/18/2011  . Pre-ulcerative corn or callous 11/20/2011  . Type II or unspecified type diabetes mellitus without mention of complication, uncontrolled 09/20/2011    Patient Active Problem List   Diagnosis Date Noted  . Tobacco abuse 10/28/2017  . Nausea vomiting and diarrhea 10/28/2017  . Hypokalemia 10/28/2017  . Hypertensive urgency 10/28/2017  . Left leg swelling 02/02/2017  . Acute hearing loss, right 08/05/2016  . Left foot drop 03/05/2014  . Diverticulosis of colon without hemorrhage 02/27/2014  . CKD (chronic kidney disease), stage V ([place]) 02/27/2014  . Left knee pain 02/14/2014  . Nausea alone 02/13/2013  . Early satiety 02/13/2013  . Unspecified constipation 02/13/2013  . Pain of left calf 02/22/2012  .  Calf pain, left 11/20/2011  . Pre-ulcerative corn or callous 11/20/2011  . Hyperlipidemia 11/08/2011  . Allergic rhinitis, cause unspecified 09/20/2011  . Type II diabetes mellitus with renal manifestations ([place]) 09/20/2011  . Childhood asthma 09/20/2011  . Pneumonia 09/18/2011  . Anemia, unspecified 09/18/2011  . HTN (hypertension) 09/18/2011  . Colitis 09/18/2011  . Preventative health care 09/18/2011  . Dementia 09/18/2011  . Alcohol abuse 09/18/2011  . Foot ulcer ([place]) 09/18/2011    Past Surgical History:  Procedure Laterality Date  . TONSILLECTOMY  1985        Home Medications    Prior to Admission medications   Medication Sig Start Date End Date Taking? Authorizing Provider  aspirin EC 81 MG tablet Take 1 tablet (81 mg total) by mouth daily. 02/27/14  Yes Biagio Borg, MD  diltiazem (CARDIZEM SR) 120 MG 12 hr capsule Take 2 capsules (240 mg total) by mouth 2 (two) times daily. 09/23/17  Yes Jola Schmidt, MD  hydrALAZINE (APRESOLINE) 50 MG tablet Take 50 mg by mouth 2 (two) times daily. 10/25/17  Yes [provider]  hydrochlorothiazide (HYDRODIURIL) 25 MG tablet Take 1 tablet (25 mg total) by mouth daily. 10/19/17  Yes Biagio Borg, MD  lovastatin (MEVACOR) 40 MG tablet TAKE 1 TABLET BY MOUTH  DAILY. 09/02/17  Yes Biagio Borg, MD  metFORMIN (GLUCOPHAGE) 1000 MG tablet Take 1,000 mg by mouth 2 (two) times daily. 11/18/17  Yes [provider]  metoprolol succinate (TOPROL-XL) 50 MG 24 hr tablet  Take 50 mg by mouth daily. 11/18/17  Yes [provider]  metoprolol tartrate (LOPRESSOR) 50 MG tablet Take 1 tablet (50 mg total) by mouth 2 (two) times daily. 09/23/17  Yes Jola Schmidt, MD  Multiple Vitamin (MULTIVITAMIN) tablet Take 1 tablet by mouth daily.   Yes [provider]  ondansetron (ZOFRAN) 4 MG tablet Take 1 tablet (4 mg total) by mouth every 8 (eight) hours as needed for nausea or vomiting. 10/29/17 10/29/18 Yes Cristal Deer, MD    sitaGLIPtin (JANUVIA) 100 MG tablet Take 1 tablet (100 mg total) by mouth daily. 08/11/17 08/11/19 Yes Biagio Borg, MD  Dulaglutide (TRULICITY) 0.01 VC/9.4WH SOPN Inject 0.5 mLs into the skin once a week. Patient not taking: Reported on 11/22/2017 09/13/17   Biagio Borg, MD  glucose blood (ONE TOUCH ULTRA TEST) test strip Use as instructed once daily E11.9 10/26/17   Biagio Borg, MD  Lancets [facility] ULTRASOFT) lancets 1 each by Other route 2 (two) times daily. Use to check blood sugars once a day Dx e11.9 10/26/17   Biagio Borg, MD    Family History Family History  Problem Relation Age of Onset  . Prostate cancer Paternal Grandfather   . Diabetes Mother   . Heart disease Mother   . Transient ischemic attack Mother   . Diabetes Sister   . Diabetes Maternal Grandfather   . Colon cancer Neg Hx     Social History Social History   Tobacco Use  . Smoking status: Current Every Day Smoker    Packs/day: 0.50    Types: Cigarettes  . Smokeless tobacco: Never Used  Substance Use Topics  . Alcohol use: Yes    Alcohol/week: 1.2 oz    Types: 2 Cans of beer per week  . Drug use: No     Allergies   Patient has no known allergies.   Review of Systems Review of Systems  Constitutional: Negative for chills and fever.  Respiratory: Negative for chest tightness and shortness of breath.   Cardiovascular: Positive for leg swelling. Negative for chest pain and palpitations.  Gastrointestinal: Negative for abdominal pain, constipation, diarrhea, nausea and vomiting.  All other systems reviewed and are negative.    Physical Exam Updated Vital Signs BP (!) 176/90   Pulse 66   Temp 97.7 F (36.5 C) (Oral)   Resp 12   SpO2 94%   Physical Exam  Constitutional: He is oriented to person, place, and time. He appears well-developed and well-nourished. No distress.  HENT:  Head: Normocephalic and atraumatic.  Mouth/Throat: Oropharynx is clear and moist.  Eyes: Conjunctivae are  normal. Right eye exhibits no discharge. Left eye exhibits no discharge. No scleral icterus.  Neck: Normal range of motion.  Cardiovascular: Normal rate, regular rhythm, normal heart sounds and intact distal pulses.  No murmur heard. Pulmonary/Chest: Effort normal and breath sounds normal. No stridor. No respiratory distress.  Abdominal: Soft. Bowel sounds are normal. He exhibits no distension. There is no tenderness.  Musculoskeletal: He exhibits no edema or deformity.  Neurological: He is alert and oriented to person, place, and time. He exhibits normal muscle tone.  Skin: Skin is warm and dry. He is not diaphoretic.  Psychiatric: He has a normal mood and affect. His behavior is normal.  Nursing note and vitals reviewed.    ED Treatments / Results  Labs (all labs ordered are listed, but only abnormal results are displayed) Labs Reviewed  URINALYSIS, ROUTINE W REFLEX MICROSCOPIC - Abnormal; Notable  for the following components:      Result Value   Glucose, UA 50 (*)    Protein, ur >=300 (*)    Bacteria, UA RARE (*)    All other components within normal limits  CBC WITH DIFFERENTIAL/PLATELET - Abnormal; Notable for the following components:   RBC 3.30 (*)    Hemoglobin 10.4 (*)    HCT 31.9 (*)    All other components within normal limits  COMPREHENSIVE METABOLIC PANEL - Abnormal; Notable for the following components:   Glucose, Bld 170 (*)    BUN 41 (*)    Creatinine, Ser 6.19 (*)    AST 14 (*)    ALT 9 (*)    GFR calc non Af Amer 9 (*)    GFR calc Af Amer 10 (*)    All other components within normal limits  CBG MONITORING, ED    EKG EKG Interpretation  Date/Time:  Tuesday Nov 22 2017 15:50:24 EDT Ventricular Rate:  66 PR Interval:  154 QRS Duration: 94 QT Interval:  438 QTC Calculation: 459 R Axis:   20 Text Interpretation:  Normal sinus rhythm Normal ECG Confirmed by Tanna Furry [phone]) on 11/22/2017 5:37:54 PM   Radiology No results  found.  Procedures Procedures (including critical care time)  Medications Ordered in ED Medications  hydrALAZINE (APRESOLINE) injection 10 mg (10 mg Intravenous Given 11/22/17 1814)     Initial Impression / Assessment and Plan / ED Course  I have reviewed the triage vital signs and the nursing notes.  Pertinent labs & imaging results that were available during my care of the patient were reviewed by me and considered in my medical decision making (see chart for details).  Clinical Course as of Nov 23 2310  Tue Nov 22, 2017  1737 Spoke with Dr. Jimmy Footman from [facility], He does not feel like patient needs admission for this, can be done as an outpatient.    [EH]  1601 Verified medication dosages with patient and his family.  Informed them that patient does not need to be admitted.  They expressed concern about getting this done in a timely fashion as an outpatient.   [EH]    Clinical Course User Index [EH] Lorin Glass, PA-C    Patient presents today requesting placement of dialysis access and dialysis.  He has had stage V CKD for the past few months however has refused vascular access and dialysis.  He reports that for the past few days he has generally been feeling unwell, now realizes he has no other option, and wishes for dialysis today.  His potassium is not significantly elevated.  He denies shortness of breath and his lungs are clear to auscultation bilaterally, no obvious leg swelling or rales in his lungs.  Patient does not appear significantly fluid overloaded, he is mildly hypertensive, however has not taken his home blood pressure meds today.  I discussed the patient with Dr. Jimmy Footman from [facility] who does not feel like patient requires admission for this or access placement tonight.  He requested patient follow-up as an outpatient with his primary nephrologist.  Patient was given his home blood pressure meds which started to reduce his blood pressure from  205/87 down to 176/90.  Patient is fully asymptomatic at this time.  Expect that his blood pressure will continue to decrease as he was also given an IV dose of hydralazine.  He was instructed on the importance of taking all of his medications at home.  Return precautions were discussed, patient and family state understanding.  Patient discharged home.  Final Clinical Impressions(s) / ED Diagnoses   Final diagnoses:  CKD (chronic kidney disease), stage V [facility])    ED Discharge Orders    None       Ollen Gross 11/22/17 2312    Tanna Furry, MD 11/29/17 [phone]

## 2017-11-22 NOTE — Telephone Encounter (Signed)
Called pt's mother, Enid Derry, LVM with details below.

## 2017-11-22 NOTE — Discharge Instructions (Addendum)
Please call Dr. Lorrene Reid office in the morning.  Please make sure you are taking all your blood pressure medications.

## 2017-11-22 NOTE — Telephone Encounter (Signed)
Dr. Jenny Reichmann please advise. Should I have pt contact dialysis?

## 2017-11-24 ENCOUNTER — Telehealth: Payer: Self-pay | Admitting: Internal Medicine

## 2017-11-24 DIAGNOSIS — N185 Chronic kidney disease, stage 5: Secondary | ICD-10-CM | POA: Diagnosis not present

## 2017-11-24 DIAGNOSIS — E1122 Type 2 diabetes mellitus with diabetic chronic kidney disease: Secondary | ICD-10-CM | POA: Diagnosis not present

## 2017-11-24 DIAGNOSIS — I120 Hypertensive chronic kidney disease with stage 5 chronic kidney disease or end stage renal disease: Secondary | ICD-10-CM | POA: Diagnosis not present

## 2017-11-24 DIAGNOSIS — D631 Anemia in chronic kidney disease: Secondary | ICD-10-CM | POA: Diagnosis not present

## 2017-11-24 DIAGNOSIS — N2581 Secondary hyperparathyroidism of renal origin: Secondary | ICD-10-CM | POA: Diagnosis not present

## 2017-11-24 NOTE — Telephone Encounter (Signed)
Copied from [place] [id number]. Topic: Quick Communication - Rx Refill/Question >> Nov 24, 2017  1:23 PM Neva Seat wrote: Kidney doctor Dr. Eustace Pen is stating Metformin - causing harm to kidneys Dr. Eustace Pen at [facility]  Asking if there is another Rx that can be prescribed to him.

## 2017-11-25 MED ORDER — DULAGLUTIDE 1.5 MG/0.5ML ~~LOC~~ SOAJ: 0.5000 mL | SUBCUTANEOUS | 3 refills | Status: DC

## 2017-11-25 NOTE — Telephone Encounter (Signed)
Ok for mother to inquire at the pharmacy for a medication similar in that family that would be covered by his insurance

## 2017-11-25 NOTE — Telephone Encounter (Signed)
Ok to stop the metformin  OK to increase the trulicity to 1.5 - done erx

## 2017-11-25 NOTE — Telephone Encounter (Signed)
Pt's mother has been informed and expressed understanding.  

## 2017-11-25 NOTE — Telephone Encounter (Signed)
Pt mom is calling back  and will pick up the trulicity . Pt will try the trulicity

## 2017-11-25 NOTE — Telephone Encounter (Signed)
Pt's mother stated that he never started the Trulicity because it was too expensive $140 per month. Please advise.

## 2017-11-30 DIAGNOSIS — F17210 Nicotine dependence, cigarettes, uncomplicated: Secondary | ICD-10-CM | POA: Diagnosis not present

## 2017-11-30 DIAGNOSIS — N186 End stage renal disease: Secondary | ICD-10-CM | POA: Diagnosis not present

## 2017-11-30 DIAGNOSIS — E1122 Type 2 diabetes mellitus with diabetic chronic kidney disease: Secondary | ICD-10-CM | POA: Diagnosis not present

## 2017-11-30 DIAGNOSIS — K5730 Diverticulosis of large intestine without perforation or abscess without bleeding: Secondary | ICD-10-CM | POA: Diagnosis not present

## 2017-11-30 DIAGNOSIS — I120 Hypertensive chronic kidney disease with stage 5 chronic kidney disease or end stage renal disease: Secondary | ICD-10-CM | POA: Diagnosis not present

## 2017-11-30 DIAGNOSIS — E1142 Type 2 diabetes mellitus with diabetic polyneuropathy: Secondary | ICD-10-CM | POA: Diagnosis not present

## 2017-11-30 DIAGNOSIS — Z7982 Long term (current) use of aspirin: Secondary | ICD-10-CM

## 2017-11-30 DIAGNOSIS — Z9181 History of falling: Secondary | ICD-10-CM | POA: Diagnosis not present

## 2017-11-30 DIAGNOSIS — J45909 Unspecified asthma, uncomplicated: Secondary | ICD-10-CM | POA: Diagnosis not present

## 2017-11-30 DIAGNOSIS — Z8701 Personal history of pneumonia (recurrent): Secondary | ICD-10-CM

## 2017-11-30 DIAGNOSIS — F1010 Alcohol abuse, uncomplicated: Secondary | ICD-10-CM | POA: Diagnosis not present

## 2017-11-30 DIAGNOSIS — D631 Anemia in chronic kidney disease: Secondary | ICD-10-CM | POA: Diagnosis not present

## 2017-11-30 DIAGNOSIS — F0390 Unspecified dementia without behavioral disturbance: Secondary | ICD-10-CM | POA: Diagnosis not present

## 2017-11-30 DIAGNOSIS — Z7984 Long term (current) use of oral hypoglycemic drugs: Secondary | ICD-10-CM

## 2017-11-30 DIAGNOSIS — E785 Hyperlipidemia, unspecified: Secondary | ICD-10-CM

## 2017-12-01 DIAGNOSIS — N185 Chronic kidney disease, stage 5: Secondary | ICD-10-CM | POA: Diagnosis not present

## 2017-12-01 DIAGNOSIS — I129 Hypertensive chronic kidney disease with stage 1 through stage 4 chronic kidney disease, or unspecified chronic kidney disease: Secondary | ICD-10-CM | POA: Diagnosis not present

## 2017-12-12 ENCOUNTER — Other Ambulatory Visit: Payer: Self-pay

## 2017-12-12 DIAGNOSIS — N183 Chronic kidney disease, stage 3 unspecified: Secondary | ICD-10-CM

## 2017-12-22 ENCOUNTER — Other Ambulatory Visit: Payer: Self-pay | Admitting: *Deleted

## 2017-12-22 DIAGNOSIS — D631 Anemia in chronic kidney disease: Secondary | ICD-10-CM | POA: Diagnosis not present

## 2017-12-22 DIAGNOSIS — E1122 Type 2 diabetes mellitus with diabetic chronic kidney disease: Secondary | ICD-10-CM | POA: Diagnosis not present

## 2017-12-22 DIAGNOSIS — N2581 Secondary hyperparathyroidism of renal origin: Secondary | ICD-10-CM | POA: Diagnosis not present

## 2017-12-22 DIAGNOSIS — N185 Chronic kidney disease, stage 5: Secondary | ICD-10-CM | POA: Diagnosis not present

## 2017-12-22 DIAGNOSIS — I120 Hypertensive chronic kidney disease with stage 5 chronic kidney disease or end stage renal disease: Secondary | ICD-10-CM | POA: Diagnosis not present

## 2017-12-23 ENCOUNTER — Ambulatory Visit (INDEPENDENT_AMBULATORY_CARE_PROVIDER_SITE_OTHER): Payer: Medicare Other | Admitting: Vascular Surgery

## 2017-12-23 ENCOUNTER — Ambulatory Visit (INDEPENDENT_AMBULATORY_CARE_PROVIDER_SITE_OTHER)
Admission: RE | Admit: 2017-12-23 | Discharge: 2017-12-23 | Disposition: A | Discharge status: Home / Self Care | Payer: Medicare Other | Source: Ambulatory Visit | Attending: Vascular Surgery | Admitting: Vascular Surgery

## 2017-12-23 ENCOUNTER — Other Ambulatory Visit: Payer: Self-pay

## 2017-12-23 ENCOUNTER — Ambulatory Visit (HOSPITAL_COMMUNITY): Payer: Medicare Other | Admitting: Anesthesiology

## 2017-12-23 ENCOUNTER — Encounter (HOSPITAL_COMMUNITY): Payer: Self-pay

## 2017-12-23 ENCOUNTER — Encounter: Payer: Self-pay | Admitting: Vascular Surgery

## 2017-12-23 ENCOUNTER — Encounter (HOSPITAL_COMMUNITY)
Admission: RE | Disposition: A | Discharge status: Home / Self Care | Payer: Self-pay | Source: Ambulatory Visit | Attending: Vascular Surgery

## 2017-12-23 ENCOUNTER — Ambulatory Visit (HOSPITAL_COMMUNITY): Payer: Medicare Other

## 2017-12-23 ENCOUNTER — Ambulatory Visit (HOSPITAL_COMMUNITY)
Admission: RE | Admit: 2017-12-23 | Discharge: 2017-12-23 | Disposition: A | Discharge status: Home / Self Care | Payer: Medicare Other | Source: Ambulatory Visit | Attending: Vascular Surgery | Admitting: Vascular Surgery

## 2017-12-23 VITALS — BP 174/98 | HR 83 | Ht 72.0 in | Wt 158.0 lb

## 2017-12-23 DIAGNOSIS — F17210 Nicotine dependence, cigarettes, uncomplicated: Secondary | ICD-10-CM | POA: Insufficient documentation

## 2017-12-23 DIAGNOSIS — N183 Chronic kidney disease, stage 3 unspecified: Secondary | ICD-10-CM

## 2017-12-23 DIAGNOSIS — Z79899 Other long term (current) drug therapy: Secondary | ICD-10-CM | POA: Diagnosis not present

## 2017-12-23 DIAGNOSIS — Z992 Dependence on renal dialysis: Secondary | ICD-10-CM

## 2017-12-23 DIAGNOSIS — Z7984 Long term (current) use of oral hypoglycemic drugs: Secondary | ICD-10-CM | POA: Insufficient documentation

## 2017-12-23 DIAGNOSIS — I120 Hypertensive chronic kidney disease with stage 5 chronic kidney disease or end stage renal disease: Secondary | ICD-10-CM | POA: Diagnosis not present

## 2017-12-23 DIAGNOSIS — N186 End stage renal disease: Secondary | ICD-10-CM | POA: Diagnosis not present

## 2017-12-23 DIAGNOSIS — Z7982 Long term (current) use of aspirin: Secondary | ICD-10-CM | POA: Diagnosis not present

## 2017-12-23 DIAGNOSIS — E785 Hyperlipidemia, unspecified: Secondary | ICD-10-CM | POA: Insufficient documentation

## 2017-12-23 DIAGNOSIS — F0390 Unspecified dementia without behavioral disturbance: Secondary | ICD-10-CM | POA: Diagnosis not present

## 2017-12-23 DIAGNOSIS — E1122 Type 2 diabetes mellitus with diabetic chronic kidney disease: Secondary | ICD-10-CM | POA: Diagnosis not present

## 2017-12-23 DIAGNOSIS — N185 Chronic kidney disease, stage 5: Secondary | ICD-10-CM | POA: Diagnosis not present

## 2017-12-23 DIAGNOSIS — Z4901 Encounter for fitting and adjustment of extracorporeal dialysis catheter: Secondary | ICD-10-CM

## 2017-12-23 DIAGNOSIS — D649 Anemia, unspecified: Secondary | ICD-10-CM | POA: Insufficient documentation

## 2017-12-23 HISTORY — DX: Pure hypercholesterolemia, unspecified: E78.00

## 2017-12-23 HISTORY — DX: Cerebral infarction, unspecified: I63.9

## 2017-12-23 HISTORY — DX: Chronic kidney disease, stage 5: N18.5

## 2017-12-23 HISTORY — PX: AV FISTULA PLACEMENT: SHX1204

## 2017-12-23 HISTORY — PX: INSERTION OF DIALYSIS CATHETER: SHX1324

## 2017-12-23 HISTORY — DX: Unspecified osteoarthritis, unspecified site: M19.90

## 2017-12-23 LAB — HEMOGLOBIN A1C
HEMOGLOBIN A1C: 5.3 % (ref 4.8–5.6)
Mean Plasma Glucose: 105.41 mg/dL

## 2017-12-23 LAB — GLUCOSE, CAPILLARY
GLUCOSE-CAPILLARY: 91 mg/dL (ref 65–99)
GLUCOSE-CAPILLARY: 103 mg/dL — AB (ref 65–99)

## 2017-12-23 LAB — POCT I-STAT 4, (NA,K, GLUC, HGB,HCT)
Glucose, Bld: 100 mg/dL — ABNORMAL HIGH (ref 65–99)
HCT: 29 % — ABNORMAL LOW (ref 39.0–52.0)
Hemoglobin: 9.9 g/dL — ABNORMAL LOW (ref 13.0–17.0)
Potassium: 3.9 mmol/L (ref 3.5–5.1)
Sodium: 139 mmol/L (ref 135–145)

## 2017-12-23 SURGERY — ARTERIOVENOUS (AV) FISTULA CREATION
Anesthesia: General | Site: Chest | Laterality: Right

## 2017-12-23 MED ORDER — LIDOCAINE HCL (CARDIAC) PF 100 MG/5ML IV SOSY
PREFILLED_SYRINGE | INTRAVENOUS | Status: DC | PRN
  Administered 2017-12-23: 100 mg via INTRAVENOUS

## 2017-12-23 MED ORDER — OXYCODONE-ACETAMINOPHEN 5-325 MG PO TABS
1.0000 | ORAL_TABLET | Freq: Once | ORAL | Status: AC | PRN
  Administered 2017-12-23: 1 via ORAL

## 2017-12-23 MED ORDER — SODIUM CHLORIDE 0.9 % IV SOLN
INTRAVENOUS | Status: DC
  Administered 2017-12-23: 11:00:00 via INTRAVENOUS

## 2017-12-23 MED ORDER — LIDOCAINE-EPINEPHRINE (PF) 1 %-1:200000 IJ SOLN: INTRAMUSCULAR | Status: DC | PRN

## 2017-12-23 MED ORDER — HEPARIN SODIUM (PORCINE) 1000 UNIT/ML IJ SOLN
INTRAMUSCULAR | Status: AC
  Filled 2017-12-23: qty 1

## 2017-12-23 MED ORDER — SODIUM CHLORIDE 0.9 % IV SOLN
INTRAVENOUS | Status: DC | PRN
  Administered 2017-12-23: 14:00:00

## 2017-12-23 MED ORDER — MIDAZOLAM HCL 2 MG/2ML IJ SOLN
INTRAMUSCULAR | Status: AC
  Filled 2017-12-23: qty 2

## 2017-12-23 MED ORDER — FENTANYL CITRATE (PF) 100 MCG/2ML IJ SOLN
INTRAMUSCULAR | Status: DC | PRN
  Administered 2017-12-23: 50 ug via INTRAVENOUS
  Administered 2017-12-23: 25 ug via INTRAVENOUS

## 2017-12-23 MED ORDER — FENTANYL CITRATE (PF) 100 MCG/2ML IJ SOLN
INTRAMUSCULAR | Status: AC
  Filled 2017-12-23: qty 2

## 2017-12-23 MED ORDER — ONDANSETRON HCL 4 MG/2ML IJ SOLN
INTRAMUSCULAR | Status: DC | PRN
  Administered 2017-12-23: 4 mg via INTRAVENOUS

## 2017-12-23 MED ORDER — MIDAZOLAM HCL 5 MG/5ML IJ SOLN
INTRAMUSCULAR | Status: DC | PRN
  Administered 2017-12-23: 2 mg via INTRAVENOUS

## 2017-12-23 MED ORDER — LIDOCAINE-EPINEPHRINE (PF) 1 %-1:200000 IJ SOLN
INTRAMUSCULAR | Status: AC
  Filled 2017-12-23: qty 30

## 2017-12-23 MED ORDER — OXYCODONE-ACETAMINOPHEN 5-325 MG PO TABS
ORAL_TABLET | ORAL | Status: AC
  Filled 2017-12-23: qty 1

## 2017-12-23 MED ORDER — PROMETHAZINE HCL 25 MG/ML IJ SOLN: 6.2500 mg | INTRAMUSCULAR | Status: DC | PRN

## 2017-12-23 MED ORDER — DEXAMETHASONE SODIUM PHOSPHATE 10 MG/ML IJ SOLN
INTRAMUSCULAR | Status: DC | PRN
  Administered 2017-12-23: 5 mg via INTRAVENOUS

## 2017-12-23 MED ORDER — 0.9 % SODIUM CHLORIDE (POUR BTL) OPTIME
TOPICAL | Status: DC | PRN
  Administered 2017-12-23: 1000 mL

## 2017-12-23 MED ORDER — OXYCODONE-ACETAMINOPHEN 5-325 MG PO TABS: 1.0000 | ORAL_TABLET | Freq: Four times a day (QID) | ORAL | 0 refills | Status: DC | PRN

## 2017-12-23 MED ORDER — SODIUM CHLORIDE 0.9 % IV SOLN
INTRAVENOUS | Status: AC
  Filled 2017-12-23: qty 1.2

## 2017-12-23 MED ORDER — ONDANSETRON HCL 4 MG/2ML IJ SOLN
INTRAMUSCULAR | Status: AC
  Filled 2017-12-23: qty 4

## 2017-12-23 MED ORDER — PROPOFOL 10 MG/ML IV BOLUS
INTRAVENOUS | Status: DC | PRN
  Administered 2017-12-23: 160 mg via INTRAVENOUS

## 2017-12-23 MED ORDER — FENTANYL CITRATE (PF) 100 MCG/2ML IJ SOLN
25.0000 ug | INTRAMUSCULAR | Status: DC | PRN
  Administered 2017-12-23: 50 ug via INTRAVENOUS

## 2017-12-23 MED ORDER — LIDOCAINE 2% (20 MG/ML) 5 ML SYRINGE
INTRAMUSCULAR | Status: AC
  Filled 2017-12-23: qty 5

## 2017-12-23 MED ORDER — HEPARIN SODIUM (PORCINE) 1000 UNIT/ML IJ SOLN
INTRAMUSCULAR | Status: DC | PRN
  Administered 2017-12-23: 3000 [IU]

## 2017-12-23 MED ORDER — FENTANYL CITRATE (PF) 250 MCG/5ML IJ SOLN
INTRAMUSCULAR | Status: AC
  Filled 2017-12-23: qty 5

## 2017-12-23 MED ORDER — DEXAMETHASONE SODIUM PHOSPHATE 10 MG/ML IJ SOLN
INTRAMUSCULAR | Status: AC
  Filled 2017-12-23: qty 2

## 2017-12-23 MED ORDER — CEFAZOLIN SODIUM-DEXTROSE 2-4 GM/100ML-% IV SOLN
2.0000 g | INTRAVENOUS | Status: AC
  Administered 2017-12-23: 2 g via INTRAVENOUS
  Filled 2017-12-23: qty 100

## 2017-12-23 MED ORDER — PROPOFOL 10 MG/ML IV BOLUS
INTRAVENOUS | Status: AC
  Filled 2017-12-23: qty 20

## 2017-12-23 SURGICAL SUPPLY — 55 items
ADH SKN CLS APL DERMABOND .7 (GAUZE/BANDAGES/DRESSINGS) ×2
ARMBAND PINK RESTRICT EXTREMIT (MISCELLANEOUS) ×4 IMPLANT
BAG BANDED W/RUBBER/TAPE 36X54 (MISCELLANEOUS) ×2 IMPLANT
BAG DECANTER FOR FLEXI CONT (MISCELLANEOUS) ×2 IMPLANT
BAG EQP BAND 135X91 W/RBR TAPE (MISCELLANEOUS) ×2
BIOPATCH RED 1 DISK 7.0 (GAUZE/BANDAGES/DRESSINGS) ×3 IMPLANT
BIOPATCH RED 1IN DISK 7.0MM (GAUZE/BANDAGES/DRESSINGS) ×1
CANISTER SUCT 3000ML PPV (MISCELLANEOUS) ×4 IMPLANT
CATH PALINDROME RT-P 15FX19CM (CATHETERS) ×2 IMPLANT
CATH PALINDROME RT-P 15FX23CM (CATHETERS) IMPLANT
CATH PALINDROME RT-P 15FX28CM (CATHETERS) IMPLANT
CATH PALINDROME RT-P 15FX55CM (CATHETERS) IMPLANT
CLIP VESOCCLUDE MED 6/CT (CLIP) ×4 IMPLANT
CLIP VESOCCLUDE SM WIDE 6/CT (CLIP) ×4 IMPLANT
COVER DOME SNAP 22 D (MISCELLANEOUS) ×2 IMPLANT
COVER PROBE W GEL 5X96 (DRAPES) ×4 IMPLANT
COVER SURGICAL LIGHT HANDLE (MISCELLANEOUS) ×4 IMPLANT
DERMABOND ADVANCED (GAUZE/BANDAGES/DRESSINGS) ×2
DERMABOND ADVANCED .7 DNX12 (GAUZE/BANDAGES/DRESSINGS) ×2 IMPLANT
DRAPE CHEST BREAST 15X10 FENES (DRAPES) ×4 IMPLANT
ELECT REM PT RETURN 9FT ADLT (ELECTROSURGICAL) ×4
ELECTRODE REM PT RTRN 9FT ADLT (ELECTROSURGICAL) ×2 IMPLANT
GAUZE SPONGE 4X4 16PLY XRAY LF (GAUZE/BANDAGES/DRESSINGS) ×4 IMPLANT
GLOVE BIO SURGEON STRL SZ 6.5 (GLOVE) ×4 IMPLANT
GLOVE BIO SURGEON STRL SZ7.5 (GLOVE) ×4 IMPLANT
GLOVE BIO SURGEONS STRL SZ 6.5 (GLOVE) ×4
GLOVE BIOGEL PI IND STRL 6.5 (GLOVE) IMPLANT
GLOVE BIOGEL PI INDICATOR 6.5 (GLOVE) ×6
GLOVE ECLIPSE 7.5 STRL STRAW (GLOVE) ×4 IMPLANT
GOWN STRL REUS W/ TWL LRG LVL3 (GOWN DISPOSABLE) ×4 IMPLANT
GOWN STRL REUS W/ TWL XL LVL3 (GOWN DISPOSABLE) ×2 IMPLANT
GOWN STRL REUS W/TWL LRG LVL3 (GOWN DISPOSABLE) ×8
GOWN STRL REUS W/TWL XL LVL3 (GOWN DISPOSABLE) ×20
KIT BASIN OR (CUSTOM PROCEDURE TRAY) ×4 IMPLANT
KIT TURNOVER KIT B (KITS) ×4 IMPLANT
NDL 18GX1X1/2 (RX/OR ONLY) (NEEDLE) ×2 IMPLANT
NEEDLE 18GX1X1/2 (RX/OR ONLY) (NEEDLE) ×4 IMPLANT
NS IRRIG 1000ML POUR BTL (IV SOLUTION) ×4 IMPLANT
PACK CV ACCESS (CUSTOM PROCEDURE TRAY) ×4 IMPLANT
PAD ARMBOARD 7.5X6 YLW CONV (MISCELLANEOUS) ×8 IMPLANT
SOAP 2 % CHG 4 OZ (WOUND CARE) ×4 IMPLANT
STOPCOCK 4 WAY LG BORE MALE ST (IV SETS) ×2 IMPLANT
SUT ETHILON 3 0 PS 1 (SUTURE) ×4 IMPLANT
SUT MNCRL AB 4-0 PS2 18 (SUTURE) ×6 IMPLANT
SUT PROLENE 6 0 BV (SUTURE) ×4 IMPLANT
SUT VIC AB 3-0 SH 27 (SUTURE) ×4
SUT VIC AB 3-0 SH 27X BRD (SUTURE) ×2 IMPLANT
SYR 10ML LL (SYRINGE) ×4 IMPLANT
SYR 20CC LL (SYRINGE) ×6 IMPLANT
SYR 5ML LL (SYRINGE) ×4 IMPLANT
TOWEL GREEN STERILE (TOWEL DISPOSABLE) ×4 IMPLANT
TOWEL GREEN STERILE FF (TOWEL DISPOSABLE) ×4 IMPLANT
TOWEL OR 17X26 4PK STRL BLUE (TOWEL DISPOSABLE) ×4 IMPLANT
UNDERPAD 30X30 (UNDERPADS AND DIAPERS) ×4 IMPLANT
WATER STERILE IRR 1000ML POUR (IV SOLUTION) ×4 IMPLANT

## 2017-12-23 NOTE — Op Note (Signed)
    Patient name: Christopher Lopez MRN: [id number] DOB: 1960-12-11 Sex: male  12/23/2017 Pre-operative Diagnosis: End-stage renal disease  Post-operative diagnosis:  Same Surgeon:  Erlene Quan C. Donzetta Matters, MD Assistant: Leontine Locket, PA Procedure Performed: 1.  Right IJ 19 cm tunneled dialysis catheter with ultrasound guidance 2.   Left arm first stage basilic vein transposition fistula  Indications: 57 year old male with aggressive renal failure now indicated for tunneled catheter and permanent access.  There is no suitable vein for fistula creation on preoperative vein mapping.  Findings: Right IJ was patent and compressible and easily cannulated.  A 19 cm catheter was tunneled terminating at the SVC innominate junction.  The left arm there was an identifiable basilic vein which measured 0.38 cm in diameter.  This was suitable for creation of AV fistula which completion there was a strong thrill and a palpable radial pulse at the wrist.   Procedure:  The patient was identified in the holding area and taken to the operating room where is placed supine on the operative table and general anesthesia was induced.  He was sterilely prepped and draped in the bilateral neck and chest area in the left upper extremity.  Timeout was called.  We began with ultrasound-guided evaluation of the right internal jugular vein which was noted to be patent and compressible.  This was cannulated with direct ultrasound guidance with 18-gauge needle and a wire was passed.  A counterincision was made and a 19 cm catheter was tunneled.  The wire tract was serially dilated and introducer sheath was placed under fluoroscopic guidance.  Catheter was then placed to the SVC innominate junction.  It was assembled and flushed with heparinized saline.  It was affixed to the skin with 3-0 nylon suture and the neck incision was closed with 4-0 Monocryl.  Dermabond was placed to both sides.  Catheter was locked with concentrated heparin to  1.5 cc in each port.  We then turned our attention the left arm where ultrasound was used to evaluate what appeared to be a suitable basilic vein measuring 8.29 cm.  There was a palpable brachial pulse above the antecubitum and a curvilinear incision was made between the 2.  We dissected down first of the vein marked it for orientation.  Then dissected out the brachial artery placed a vessel loop around this.  The vein was then transected near the antecubitum dilated up to 4.5 mm and flushed with heparinized saline and clamped.  The artery was clamped distally and proximally opened longitudinally flushed with heparinized saline in both directions.  The vein was then sewn end-to-side with 6-0 Prolene suture.  Prior to completion we flushed from all directions.  I completion there is a strong thrill in the runoff vein palpable radial pulse both confirmed with Doppler.  Satisfied we obtained hemostasis closed with 2 layers of Vicryl Monocryl.  Dermabond was placed to level the skin.  Patient tolerated procedure without immediate complication.  All counts were correct at completion.   EBL: 20cc  Brandonn Capelli C. Donzetta Matters, MD [facility] [place] Office: [phone] Pager: [phone]

## 2017-12-23 NOTE — Discharge Instructions (Signed)
° °  [facility]  Discharge Instructions  AV Fistula or Graft Surgery for Dialysis Access  Please refer to the following instructions for your post-procedure care. Your surgeon or physician assistant will discuss any changes with you.  Activity  You may drive the day following your surgery, if you are comfortable and no longer taking prescription pain medication. Resume full activity as the soreness in your incision resolves.  Bathing/Showering  You may shower after you go home. Keep your incision dry for 48 hours. Do not soak in a bathtub, hot tub, or swim until the incision heals completely. You may not shower if you have a hemodialysis catheter.  Incision Care  Clean your incision with mild soap and water after 48 hours. Pat the area dry with a clean towel. You do not need a bandage unless otherwise instructed. Do not apply any ointments or creams to your incision. You may have skin glue on your incision. Do not peel it off. It will come off on its own in about one week. Your arm may swell a bit after surgery. To reduce swelling use pillows to elevate your arm so it is above your heart. Your doctor will tell you if you need to lightly wrap your arm with an ACE bandage.  Diet  Resume your normal diet. There are not special food restrictions following this procedure. In order to heal from your surgery, it is CRITICAL to get adequate nutrition. Your body requires vitamins, minerals, and protein. Vegetables are the best source of vitamins and minerals. Vegetables also provide the perfect balance of protein. Processed food has little nutritional value, so try to avoid this.  Medications  Resume taking all of your medications. If your incision is causing pain, you may take over-the counter pain relievers such as acetaminophen (Tylenol). If you were prescribed a stronger pain medication, please be aware these medications can cause nausea and constipation. Prevent  nausea by taking the medication with a snack or meal. Avoid constipation by drinking plenty of fluids and eating foods with high amount of fiber, such as fruits, vegetables, and grains.  Do not take Tylenol if you are taking prescription pain medications.  Follow up Your surgeon may want to see you in the office following your access surgery. If so, this will be arranged at the time of your surgery.  Please call us immediately for any of the following conditions:  Increased pain, redness, drainage (pus) from your incision site Fever of 101 degrees or higher Severe or worsening pain at your incision site Hand pain or numbness.  Reduce your risk of vascular disease:  Stop smoking. If you would like help, call [facility] at 1-800-QUIT-NOW 551-384-0763) or [place] at [place] your cholesterol Maintain a desired weight Control your diabetes Keep your blood pressure down  Dialysis  It will take several weeks to several months for your new dialysis access to be ready for use. Your surgeon will determine when it is okay to use it. Your nephrologist will continue to direct your dialysis. You can continue to use your Permcath until your new access is ready for use.   12/23/2017 Christopher Lopez [id number] Dec 07, 1960  Surgeon(s): Waynetta Sandy, MD  Procedure(s): ARTERIOVENOUS (AV) FISTULA CREATION INSERTION OF PALINDROME  DIALYSIS CATHETER  x Do not stick fistula for 12 weeks    If you have any questions, please call the office at [phone].

## 2017-12-23 NOTE — Anesthesia Preprocedure Evaluation (Addendum)
Anesthesia Evaluation  Patient identified by MRN, date of birth, ID band Patient awake    Reviewed: Allergy & Precautions, NPO status , Patient's Chart, lab work & pertinent test results, reviewed documented beta blocker date and time   History of Anesthesia Complications Negative for: history of anesthetic complications  Airway Mallampati: II  TM Distance: >3 FB Neck ROM: Full    Dental no notable dental hx. (+) Dental Advisory Given   Pulmonary asthma , Current Smoker,    Pulmonary exam normal        Cardiovascular hypertension, Pt. on medications and Pt. on home beta blockers negative cardio ROS Normal cardiovascular exam     Neuro/Psych PSYCHIATRIC DISORDERS Dementia CVA negative neurological ROS  negative psych ROS   GI/Hepatic negative GI ROS, Neg liver ROS,   Endo/Other  diabetes  Renal/GU Renal disease  negative genitourinary   Musculoskeletal negative musculoskeletal ROS (+)   Abdominal   Peds negative pediatric ROS (+)  Hematology negative hematology ROS (+)   Anesthesia Other Findings   Reproductive/Obstetrics negative OB ROS                             Anesthesia Physical Anesthesia Plan  ASA: III  Anesthesia Plan: General   Post-op Pain Management:    Induction: Intravenous  PONV Risk Score and Plan: 2 and Ondansetron and Dexamethasone  Airway Management Planned: Oral ETT  Additional Equipment:   Intra-op Plan:   Post-operative Plan: Extubation in OR  Informed Consent: I have reviewed the patients History and Physical, chart, labs and discussed the procedure including the risks, benefits and alternatives for the proposed anesthesia with the patient or authorized representative who has indicated his/her understanding and acceptance.   Dental advisory given  Plan Discussed with: Anesthesiologist and CRNA  Anesthesia Plan Comments:        Anesthesia  Quick Evaluation

## 2017-12-23 NOTE — H&P (Signed)
   History and Physical Update  The patient was interviewed and re-examined.  The patient's previous History and Physical has been reviewed and is unchanged from office visit. Plan for tdc and left arm avf vs avg.  Brandon C. Donzetta Matters, MD [facility] [place] Office: [phone] Pager: [phone]   12/23/2017, 1:44 PM

## 2017-12-23 NOTE — Transfer of Care (Signed)
Immediate Anesthesia Transfer of Care Note  Patient: Christopher Lopez  Procedure(s) Performed: CREATION OF BASILIAC - CEPHALIC  ARTERIOVENOUS (AV) FISTULA  STAGE ONE (Left Arm Upper) INSERTION OF PALINDROME  DIALYSIS CATHETER (Right Chest)  Patient Location: PACU  Anesthesia Type:General  Level of Consciousness: awake, alert  and oriented  Airway & Oxygen Therapy: Patient Spontanous Breathing and Patient connected to nasal cannula oxygen  Post-op Assessment: Report given to RN and Post -op Vital signs reviewed and stable  Post vital signs: Reviewed and stable  Last Vitals:  Vitals Value Taken Time  BP 150/84 12/23/2017  3:55 PM  Temp    Pulse    Resp 11 12/23/2017  3:57 PM  SpO2    Vitals shown include unvalidated device data.  Last Pain:  Vitals:   12/23/17 1126  TempSrc: Oral  PainSc:          Complications: No apparent anesthesia complications

## 2017-12-23 NOTE — Progress Notes (Signed)
Dr. Tobias Alexander made aware of patient's elevated BP.  No new orders received.  Will continue to monitor patient.

## 2017-12-23 NOTE — Progress Notes (Signed)
Patient ID: Christopher Lopez, male   DOB: 06/09/1961, 57 y.o.   MRN: [id number]  Reason for Consult: Follow-up   Referred by Jamal Maes, MD  Subjective:     HPI:  Christopher Lopez is a 57 y.o. male with history of diabetes and now progressive renal failure sent for evaluation placement tunnel catheter and permanent access.  He is right-hand dominant.  He has never had left arm surgery or left chest surgery specifically no pacemaker.  He does not take any blood thinners.  Past Medical History:  Diagnosis Date  . Alcohol abuse 09/18/2011  . Allergic rhinitis, cause unspecified 09/20/2011  . Anemia, unspecified 09/18/2011  . Childhood asthma 09/20/2011  . Colitis 09/18/2011  . Dementia 09/18/2011  . Diabetes mellitus   . Diverticulosis of colon without hemorrhage 02/27/2014  . Foot ulcer ([place]) 09/18/2011  . HTN (hypertension) 09/18/2011  . Hyperlipidemia 11/08/2011  . Hypertension   . Impaired glucose tolerance 09/18/2011  . Pneumonia 09/18/2011  . Pre-ulcerative corn or callous 11/20/2011  . Type II or unspecified type diabetes mellitus without mention of complication, uncontrolled 09/20/2011   Family History  Problem Relation Age of Onset  . Prostate cancer Paternal Grandfather   . Diabetes Mother   . Heart disease Mother   . Transient ischemic attack Mother   . Diabetes Sister   . Diabetes Maternal Grandfather   . Colon cancer Neg Hx    Past Surgical History:  Procedure Laterality Date  . TONSILLECTOMY  1985    Short Social History:  Social History   Tobacco Use  . Smoking status: Current Every Day Smoker    Packs/day: 0.50    Types: Cigarettes  . Smokeless tobacco: Never Used  Substance Use Topics  . Alcohol use: Yes    Alcohol/week: 1.2 oz    Types: 2 Cans of beer per week    No Known Allergies  Current Outpatient Medications  Medication Sig Dispense Refill  . aspirin EC 81 MG tablet Take 1 tablet (81 mg total) by mouth daily. 90 tablet 11  . diltiazem (CARDIZEM SR)  120 MG 12 hr capsule Take 2 capsules (240 mg total) by mouth 2 (two) times daily. 120 capsule 0  . Dulaglutide (TRULICITY) 1.5 BM/8.4XL SOPN Inject 0.5 mLs into the skin once a week. 6 mL 3  . glucose blood (ONE TOUCH ULTRA TEST) test strip Use as instructed once daily E11.9 100 each 12  . hydrALAZINE (APRESOLINE) 50 MG tablet Take 50 mg by mouth 2 (two) times daily.  5  . hydrochlorothiazide (HYDRODIURIL) 25 MG tablet Take 1 tablet (25 mg total) by mouth daily. 90 tablet 3  . Lancets (ONETOUCH ULTRASOFT) lancets 1 each by Other route 2 (two) times daily. Use to check blood sugars once a day Dx e11.9 100 each 3  . lovastatin (MEVACOR) 40 MG tablet TAKE 1 TABLET BY MOUTH  DAILY. 90 tablet 2  . metFORMIN (GLUCOPHAGE) 1000 MG tablet Take 1,000 mg by mouth 2 (two) times daily.    . metoprolol succinate (TOPROL-XL) 50 MG 24 hr tablet Take 50 mg by mouth daily.    . metoprolol tartrate (LOPRESSOR) 50 MG tablet Take 1 tablet (50 mg total) by mouth 2 (two) times daily. 60 tablet 0  . Multiple Vitamin (MULTIVITAMIN) tablet Take 1 tablet by mouth daily.    . ondansetron (ZOFRAN) 4 MG tablet Take 1 tablet (4 mg total) by mouth every 8 (eight) hours as needed for nausea or vomiting. 15  tablet 0  . sitaGLIPtin (JANUVIA) 100 MG tablet Take 1 tablet (100 mg total) by mouth daily. 90 tablet 3   No current facility-administered medications for this visit.     Review of Systems  Constitutional:  Constitutional negative. HENT: HENT negative.  Eyes: Eyes negative.  Respiratory: Respiratory negative.  Cardiovascular: Cardiovascular negative.  GI: Gastrointestinal negative.  Skin: Skin negative.  Neurological: Neurological negative. Hematologic: Hematologic/lymphatic negative.  Psychiatric: Psychiatric negative.        Objective:  Objective   Vitals:   12/23/17 0905  BP: (!) 174/98  Pulse: 83  SpO2: 98%  Weight: 158 lb (71.7 kg)  Height: 6' (1.829 m)   Body mass index is 21.43 kg/m.  Physical  Exam  Constitutional: He is oriented to person, place, and time. He appears well-developed.  HENT:  Head: Normocephalic.  Eyes: Pupils are equal, round, and reactive to light.  Neck: Normal range of motion.  Cardiovascular: Normal rate.  Pulses:      Carotid pulses are 2+ on the right side, and 2+ on the left side.      Radial pulses are 2+ on the right side, and 2+ on the left side.  Pulmonary/Chest: Effort normal.  Abdominal: Soft.  Musculoskeletal: Normal range of motion. He exhibits no edema.  Neurological: He is alert and oriented to person, place, and time.  Skin: Skin is warm and dry.  Psychiatric: He has a normal mood and affect. His behavior is normal. Judgment and thought content normal.    Data: I have independently interpreted his upper extremity vein mapping which demonstrates no suitable vein for dialysis access creation.  I have also independently interpreted his upper extremity arterial duplex which is triphasic waveforms throughout the bilateral upper extremities with brachial arteries measuring 0.57 cm bilaterally.     Assessment/Plan:     57 year old male in need of tunneled dialysis catheter as well as permanent access.  He is right-hand dominant.  We will proceed with tunneled dialysis catheter and left arm likely graft today in the operating room.  Patient has been n.p.o. since last night and does not take blood thinners.     Waynetta Sandy MD [facility]

## 2017-12-23 NOTE — Anesthesia Procedure Notes (Signed)
Procedure Name: LMA Insertion Date/Time: 12/23/2017 2:42 PM Performed by: Candis Shine, CRNA Pre-anesthesia Checklist: Patient identified, Emergency Drugs available, Suction available and Patient being monitored Patient Re-evaluated:Patient Re-evaluated prior to induction Oxygen Delivery Method: Circle System Utilized Preoxygenation: Pre-oxygenation with 100% oxygen Induction Type: IV induction Ventilation: Mask ventilation without difficulty LMA: LMA inserted LMA Size: 4.0 Number of attempts: 1 Placement Confirmation: positive ETCO2 Tube secured with: Tape Dental Injury: Teeth and Oropharynx as per pre-operative assessment

## 2017-12-23 NOTE — Progress Notes (Signed)
DC instructions reviewed with patient and family, patient to call nephrology Monday AM about when dialysis will start.  Rowe Pavy, RN

## 2017-12-24 NOTE — Anesthesia Postprocedure Evaluation (Signed)
Anesthesia Post Note  Patient: KALEM ROCKWELL  Procedure(s) Performed: CREATION OF BASILIAC - CEPHALIC  ARTERIOVENOUS (AV) FISTULA  STAGE ONE (Left Arm Upper) INSERTION OF PALINDROME  DIALYSIS CATHETER (Right Chest)     Patient location during evaluation: PACU Anesthesia Type: General Level of consciousness: sedated Pain management: pain level controlled Vital Signs Assessment: post-procedure vital signs reviewed and stable Respiratory status: spontaneous breathing and respiratory function stable Cardiovascular status: stable Postop Assessment: no apparent nausea or vomiting Anesthetic complications: no    Last Vitals:  Vitals:   12/23/17 1715 12/23/17 1720  BP:  (!) 149/85  Pulse: 67 68  Resp: 11 11  Temp:    SpO2: 95% 95%    Last Pain:  Vitals:   12/23/17 1610  TempSrc:   PainSc: 6                  Philippe Gang DANIEL

## 2017-12-25 ENCOUNTER — Emergency Department (HOSPITAL_COMMUNITY): Payer: Medicare Other

## 2017-12-25 ENCOUNTER — Other Ambulatory Visit: Payer: Self-pay

## 2017-12-25 ENCOUNTER — Encounter (HOSPITAL_COMMUNITY): Payer: Self-pay | Admitting: Vascular Surgery

## 2017-12-25 ENCOUNTER — Emergency Department (HOSPITAL_COMMUNITY)
Admission: EM | Admit: 2017-12-25 | Discharge: 2017-12-25 | Disposition: A | Discharge status: Home / Self Care | Payer: Medicare Other | Attending: Emergency Medicine | Admitting: Emergency Medicine

## 2017-12-25 DIAGNOSIS — F17210 Nicotine dependence, cigarettes, uncomplicated: Secondary | ICD-10-CM | POA: Diagnosis not present

## 2017-12-25 DIAGNOSIS — F0390 Unspecified dementia without behavioral disturbance: Secondary | ICD-10-CM | POA: Insufficient documentation

## 2017-12-25 DIAGNOSIS — Z992 Dependence on renal dialysis: Secondary | ICD-10-CM | POA: Insufficient documentation

## 2017-12-25 DIAGNOSIS — N185 Chronic kidney disease, stage 5: Secondary | ICD-10-CM | POA: Diagnosis not present

## 2017-12-25 DIAGNOSIS — I120 Hypertensive chronic kidney disease with stage 5 chronic kidney disease or end stage renal disease: Secondary | ICD-10-CM | POA: Insufficient documentation

## 2017-12-25 DIAGNOSIS — E1122 Type 2 diabetes mellitus with diabetic chronic kidney disease: Secondary | ICD-10-CM | POA: Insufficient documentation

## 2017-12-25 DIAGNOSIS — R112 Nausea with vomiting, unspecified: Secondary | ICD-10-CM | POA: Diagnosis not present

## 2017-12-25 DIAGNOSIS — R1111 Vomiting without nausea: Secondary | ICD-10-CM | POA: Diagnosis not present

## 2017-12-25 DIAGNOSIS — R109 Unspecified abdominal pain: Secondary | ICD-10-CM

## 2017-12-25 DIAGNOSIS — R140 Abdominal distension (gaseous): Secondary | ICD-10-CM | POA: Diagnosis not present

## 2017-12-25 DIAGNOSIS — R0902 Hypoxemia: Secondary | ICD-10-CM | POA: Diagnosis not present

## 2017-12-25 DIAGNOSIS — R110 Nausea: Secondary | ICD-10-CM

## 2017-12-25 DIAGNOSIS — J45909 Unspecified asthma, uncomplicated: Secondary | ICD-10-CM | POA: Diagnosis not present

## 2017-12-25 DIAGNOSIS — I10 Essential (primary) hypertension: Secondary | ICD-10-CM | POA: Diagnosis not present

## 2017-12-25 LAB — COMPREHENSIVE METABOLIC PANEL
ALBUMIN: 3.4 g/dL — AB (ref 3.5–5.0)
ALK PHOS: 50 U/L (ref 38–126)
ALT: 6 U/L — ABNORMAL LOW (ref 17–63)
ANION GAP: 12 (ref 5–15)
AST: 15 U/L (ref 15–41)
BUN: 39 mg/dL — ABNORMAL HIGH (ref 6–20)
CALCIUM: 9.1 mg/dL (ref 8.9–10.3)
CO2: 23 mmol/L (ref 22–32)
CREATININE: 7.67 mg/dL — AB (ref 0.61–1.24)
Chloride: 100 mmol/L — ABNORMAL LOW (ref 101–111)
GFR calc Af Amer: 8 mL/min — ABNORMAL LOW (ref >60)
GFR calc non Af Amer: 7 mL/min — ABNORMAL LOW (ref >60)
GLUCOSE: 164 mg/dL — AB (ref 65–99)
Potassium: 4.1 mmol/L (ref 3.5–5.1)
SODIUM: 135 mmol/L (ref 135–145)
Total Bilirubin: 0.5 mg/dL (ref 0.3–1.2)
Total Protein: 7.3 g/dL (ref 6.5–8.1)

## 2017-12-25 LAB — CBC
HCT: 27.7 % — ABNORMAL LOW (ref 39.0–52.0)
HEMOGLOBIN: 8.8 g/dL — AB (ref 13.0–17.0)
MCH: 31 pg (ref 26.0–34.0)
MCHC: 31.8 g/dL (ref 30.0–36.0)
MCV: 97.5 fL (ref 78.0–100.0)
Platelets: 329 10*3/uL (ref 150–400)
RBC: 2.84 MIL/uL — ABNORMAL LOW (ref 4.22–5.81)
RDW: 14.3 % (ref 11.5–15.5)
WBC: 18.4 10*3/uL — ABNORMAL HIGH (ref 4.0–10.5)

## 2017-12-25 LAB — LIPASE, BLOOD: Lipase: 67 U/L — ABNORMAL HIGH (ref 11–51)

## 2017-12-25 MED ORDER — ONDANSETRON HCL 4 MG/2ML IJ SOLN: 4.0000 mg | Freq: Once | INTRAMUSCULAR | Status: DC | PRN

## 2017-12-25 MED ORDER — METOCLOPRAMIDE HCL 5 MG/ML IJ SOLN
10.0000 mg | Freq: Once | INTRAMUSCULAR | Status: AC
  Administered 2017-12-25: 10 mg via INTRAVENOUS
  Filled 2017-12-25: qty 2

## 2017-12-25 NOTE — Discharge Instructions (Signed)
You need to return if you start running a fever, have excessive vomiting that is not controlled with the Zofran at home, severe abdominal pain or any change in behavior.

## 2017-12-25 NOTE — ED Notes (Signed)
Patient transported to CT 

## 2017-12-25 NOTE — ED Notes (Signed)
Patient aware that we need a urine specimen 

## 2017-12-25 NOTE — ED Triage Notes (Addendum)
Patient c/o N/V; seen here Friday (oupatient) and had dialysis cath placed. Was given medications to take at home for N/V and has been taking them with no relief.

## 2017-12-25 NOTE — ED Provider Notes (Signed)
Emergency Department Provider Note   I have reviewed the triage vital signs and the nursing notes.   HISTORY  Chief Complaint Nausea   HPI Christopher Lopez is a 57 y.o. male with known stage V chronic kidney disease had a CBC placed on Friday for access to start dialysis and is was to follow-up with his nephrologist this week.  He has had nausea over the last couple days but significantly so this morning at 2:00.  Had a couple episodes of nonbloody nonbilious vomiting followed by one episode of blood-streaked vomiting.  Still feel nauseous now.  No fevers.  Some abdominal discomfort and mild fullness.  No rashes.  No diarrhea today.  No bowel movement yet today.  Has had normal bowel movements recently.  No known sick contacts. No other associated or modifying symptoms.    Past Medical History:  Diagnosis Date  . Alcohol abuse 09/18/2011  . Allergic rhinitis, cause unspecified 09/20/2011  . Anemia, unspecified 09/18/2011  . Arthritis   . Childhood asthma 09/20/2011  . CKD (chronic kidney disease) stage 5, GFR less than 15 ml/min (HCC)   . Colitis 09/18/2011  . Dementia 09/18/2011  . Diabetes mellitus   . Diverticulosis of colon without hemorrhage 02/27/2014  . Foot ulcer ([place]) 09/18/2011  . High cholesterol   . HTN (hypertension) 09/18/2011  . Hyperlipidemia 11/08/2011  . Hypertension   . Impaired glucose tolerance 09/18/2011  . Pneumonia 09/18/2011  . Pre-ulcerative corn or callous 11/20/2011  . Stroke [facility])    left leg deficit  . Type II or unspecified type diabetes mellitus without mention of complication, uncontrolled 09/20/2011    Patient Active Problem List   Diagnosis Date Noted  . Tobacco abuse 10/28/2017  . Nausea vomiting and diarrhea 10/28/2017  . Hypokalemia 10/28/2017  . Hypertensive urgency 10/28/2017  . Left leg swelling 02/02/2017  . Acute hearing loss, right 08/05/2016  . Left foot drop 03/05/2014  . Diverticulosis of colon without hemorrhage 02/27/2014  . CKD  (chronic kidney disease), stage V ([place]) 02/27/2014  . Left knee pain 02/14/2014  . Nausea alone 02/13/2013  . Early satiety 02/13/2013  . Unspecified constipation 02/13/2013  . Pain of left calf 02/22/2012  . Calf pain, left 11/20/2011  . Pre-ulcerative corn or callous 11/20/2011  . Hyperlipidemia 11/08/2011  . Allergic rhinitis, cause unspecified 09/20/2011  . Type II diabetes mellitus with renal manifestations ([employer]) 09/20/2011  . Childhood asthma 09/20/2011  . Pneumonia 09/18/2011  . Anemia, unspecified 09/18/2011  . HTN (hypertension) 09/18/2011  . Colitis 09/18/2011  . Preventative health care 09/18/2011  . Dementia 09/18/2011  . Alcohol abuse 09/18/2011  . Foot ulcer ([place]) 09/18/2011    Past Surgical History:  Procedure Laterality Date  . AV FISTULA PLACEMENT Left 12/23/2017   Procedure: CREATION OF BASILIAC - CEPHALIC  ARTERIOVENOUS (AV) FISTULA  STAGE ONE;  Surgeon: Waynetta Sandy, MD;  Location: [place];  Service: Vascular;  Laterality: Left;  . INSERTION OF DIALYSIS CATHETER Right 12/23/2017   Procedure: INSERTION OF PALINDROME  DIALYSIS CATHETER;  Surgeon: Waynetta Sandy, MD;  Location: [place];  Service: Vascular;  Laterality: Right;  . TONSILLECTOMY  1985    Current Outpatient Rx  . Order #: [id number] Class: Historical Med  . Order #: [id number] Class: No Print  . Order #: [id number] Class: Historical Med  . Order #: [id number] Class: Normal  . Order #: [id number] Class: Historical Med  . Order #: [id number] Class: Normal  . Order #: [id number] Class: Historical Med  . Order #:  [id number] Class: Historical Med  . Order #: [id number] Class: Print  . Order #: [id number] Class: Normal  . Order #: [id number] Class: Historical Med  . Order #: [id number] Class: Print  . Order #: [id number] Class: Normal  . Order #: [id number] Class: Normal  . Order #: [id number] Class: Print  . Order #: [id number] Class: Print  . Order #: [id number] Class: Normal    Allergies Patient has no  known allergies.  Family History  Problem Relation Age of Onset  . Prostate cancer Paternal Grandfather   . Diabetes Mother   . Heart disease Mother   . Transient ischemic attack Mother   . Diabetes Sister   . Diabetes Maternal Grandfather   . Colon cancer Neg Hx     Social History Social History   Tobacco Use  . Smoking status: Current Every Day Smoker    Packs/day: 0.50    Types: Cigarettes  . Smokeless tobacco: Never Used  Substance Use Topics  . Alcohol use: Not Currently    Alcohol/week: 1.2 oz    Types: 2 Cans of beer per week  . Drug use: No    Review of Systems  All other systems negative except as documented in the HPI. All pertinent positives and negatives as reviewed in the HPI. ____________________________________________   PHYSICAL EXAM:  VITAL SIGNS: ED Triage Vitals  Enc Vitals Group     BP 12/25/17 0516 (!) 168/91     Pulse Rate 12/25/17 0516 84     Resp 12/25/17 0516 16     Temp 12/25/17 0516 98 F (36.7 C)     Temp Source 12/25/17 0516 Oral     SpO2 12/25/17 0516 100 %     Weight 12/25/17 0521 171 lb (77.6 kg)     Height 12/25/17 0521 6' (1.829 m)    Constitutional: Alert and oriented. Well appearing and in no acute distress. Eyes: Conjunctivae are normal. PERRL. EOMI. Head: Atraumatic. Nose: No congestion/rhinnorhea. Mouth/Throat: Mucous membranes are moist.  Oropharynx non-erythematous. Neck: No stridor.  No meningeal signs.   Cardiovascular: Normal rate, regular rhythm. Good peripheral circulation. Grossly normal heart sounds.   Respiratory: Normal respiratory effort.  No retractions. Lungs CTAB. Gastrointestinal: Soft and nontender. No distention.  Musculoskeletal: No lower extremity tenderness nor edema. No gross deformities of extremities. Neurologic:  Normal speech and language. No gross focal neurologic deficits are appreciated.  Skin:  Skin is warm, dry and intact. No rash  noted.   ____________________________________________   LABS (all labs ordered are listed, but only abnormal results are displayed)  Labs Reviewed  LIPASE, BLOOD - Abnormal; Notable for the following components:      Result Value   Lipase 67 (*)    All other components within normal limits  COMPREHENSIVE METABOLIC PANEL - Abnormal; Notable for the following components:   Chloride 100 (*)    Glucose, Bld 164 (*)    BUN 39 (*)    Creatinine, Ser 7.67 (*)    Albumin 3.4 (*)    ALT 6 (*)    GFR calc non Af Amer 7 (*)    GFR calc Af Amer 8 (*)    All other components within normal limits  CBC - Abnormal; Notable for the following components:   WBC 18.4 (*)    RBC 2.84 (*)    Hemoglobin 8.8 (*)    HCT 27.7 (*)    All other components within normal limits  URINALYSIS, ROUTINE W REFLEX MICROSCOPIC   ____________________________________________  RADIOLOGY  No results found.  ____________________________________________  PROCEDURES  Procedure(s) performed:   Procedures   ____________________________________________   INITIAL IMPRESSION / ASSESSMENT AND PLAN / ED COURSE  Will treat symptomatically. Abdomen distended and diffusely tender. Will ct scan to ensure no obstruction or other acute causes for symptoms.   Care transferred pending CT to rule out obstruction. Nausea improved with Reglan.      Pertinent labs & imaging results that were available during my care of the patient were reviewed by me and considered in my medical decision making (see chart for details).  ____________________________________________  FINAL CLINICAL IMPRESSION(S) / ED DIAGNOSES  Final diagnoses:  None     MEDICATIONS GIVEN DURING THIS VISIT:  Medications  ondansetron (ZOFRAN) injection 4 mg (has no administration in time range)  metoCLOPramide (REGLAN) injection 10 mg (10 mg Intravenous Given 12/25/17 0604)     NEW OUTPATIENT MEDICATIONS STARTED DURING THIS VISIT:  New  Prescriptions   No medications on file    Note:  This note was prepared with assistance of Dragon voice recognition software. Occasional wrong-word or sound-a-like substitutions may have occurred due to the inherent limitations of voice recognition software.   Merrily Pew, MD 12/25/17 [phone]

## 2017-12-25 NOTE — ED Provider Notes (Signed)
Assumed care from Dr. Dayna Barker at 8 AM.  Patient having nausea vomiting and epigastric distention after recently having graft placed for impending dialysis.  Patient's labs without significant changes except for a leukocytosis of 18,000 which may be reactive due to recent vomiting.  Patient is well-appearing here and has no localized abdominal tenderness.  Patient CT is benign except for may be some slight fluid from enteritis.  Findings discussed with the patient he has been able to p.o. challenge successfully and feel he is safe for discharge home.  He does have Zofran at home.  Discussed with he and his family member return precautions and they are comfortable with this plan.   Blanchie Dessert, MD 12/25/17 1014

## 2017-12-26 ENCOUNTER — Telehealth: Payer: Self-pay | Admitting: Vascular Surgery

## 2017-12-26 LAB — POCT I-STAT 4, (NA,K, GLUC, HGB,HCT)
GLUCOSE: 102 mg/dL — AB (ref 65–99)
HCT: 28 % — ABNORMAL LOW (ref 39.0–52.0)
Hemoglobin: 9.5 g/dL — ABNORMAL LOW (ref 13.0–17.0)
Potassium: 6.9 mmol/L (ref 3.5–5.1)
Sodium: 136 mmol/L (ref 135–145)

## 2017-12-26 NOTE — Telephone Encounter (Signed)
sch appt spk to pt 01/20/18 10am Dialysis Duplex 11am p/o MD

## 2017-12-27 ENCOUNTER — Other Ambulatory Visit: Payer: Self-pay

## 2017-12-27 DIAGNOSIS — N186 End stage renal disease: Secondary | ICD-10-CM

## 2017-12-27 DIAGNOSIS — Z48812 Encounter for surgical aftercare following surgery on the circulatory system: Secondary | ICD-10-CM

## 2017-12-27 DIAGNOSIS — Z992 Dependence on renal dialysis: Principal | ICD-10-CM

## 2017-12-29 DIAGNOSIS — Z4901 Encounter for fitting and adjustment of extracorporeal dialysis catheter: Secondary | ICD-10-CM | POA: Diagnosis not present

## 2017-12-29 DIAGNOSIS — N2581 Secondary hyperparathyroidism of renal origin: Secondary | ICD-10-CM | POA: Insufficient documentation

## 2017-12-29 DIAGNOSIS — D689 Coagulation defect, unspecified: Secondary | ICD-10-CM | POA: Diagnosis not present

## 2017-12-29 DIAGNOSIS — D631 Anemia in chronic kidney disease: Secondary | ICD-10-CM | POA: Insufficient documentation

## 2017-12-29 DIAGNOSIS — E1129 Type 2 diabetes mellitus with other diabetic kidney complication: Secondary | ICD-10-CM | POA: Diagnosis not present

## 2017-12-29 DIAGNOSIS — D509 Iron deficiency anemia, unspecified: Secondary | ICD-10-CM | POA: Insufficient documentation

## 2017-12-29 DIAGNOSIS — N186 End stage renal disease: Secondary | ICD-10-CM | POA: Diagnosis not present

## 2017-12-29 DIAGNOSIS — F1011 Alcohol abuse, in remission: Secondary | ICD-10-CM | POA: Insufficient documentation

## 2017-12-31 DIAGNOSIS — E1129 Type 2 diabetes mellitus with other diabetic kidney complication: Secondary | ICD-10-CM | POA: Diagnosis not present

## 2017-12-31 DIAGNOSIS — N2581 Secondary hyperparathyroidism of renal origin: Secondary | ICD-10-CM | POA: Diagnosis not present

## 2017-12-31 DIAGNOSIS — N186 End stage renal disease: Secondary | ICD-10-CM | POA: Diagnosis not present

## 2017-12-31 DIAGNOSIS — D689 Coagulation defect, unspecified: Secondary | ICD-10-CM | POA: Diagnosis not present

## 2017-12-31 DIAGNOSIS — Z4901 Encounter for fitting and adjustment of extracorporeal dialysis catheter: Secondary | ICD-10-CM | POA: Diagnosis not present

## 2017-12-31 DIAGNOSIS — D509 Iron deficiency anemia, unspecified: Secondary | ICD-10-CM | POA: Diagnosis not present

## 2018-01-04 DIAGNOSIS — D689 Coagulation defect, unspecified: Secondary | ICD-10-CM | POA: Diagnosis not present

## 2018-01-04 DIAGNOSIS — Z4901 Encounter for fitting and adjustment of extracorporeal dialysis catheter: Secondary | ICD-10-CM | POA: Diagnosis not present

## 2018-01-04 DIAGNOSIS — N186 End stage renal disease: Secondary | ICD-10-CM | POA: Diagnosis not present

## 2018-01-04 DIAGNOSIS — E1129 Type 2 diabetes mellitus with other diabetic kidney complication: Secondary | ICD-10-CM | POA: Diagnosis not present

## 2018-01-04 DIAGNOSIS — D509 Iron deficiency anemia, unspecified: Secondary | ICD-10-CM | POA: Diagnosis not present

## 2018-01-04 DIAGNOSIS — N2581 Secondary hyperparathyroidism of renal origin: Secondary | ICD-10-CM | POA: Diagnosis not present

## 2018-01-04 DIAGNOSIS — T8249XA Other complication of vascular dialysis catheter, initial encounter: Secondary | ICD-10-CM | POA: Diagnosis not present

## 2018-01-04 DIAGNOSIS — Z992 Dependence on renal dialysis: Secondary | ICD-10-CM | POA: Diagnosis not present

## 2018-01-05 DIAGNOSIS — N2581 Secondary hyperparathyroidism of renal origin: Secondary | ICD-10-CM | POA: Diagnosis not present

## 2018-01-05 DIAGNOSIS — E1129 Type 2 diabetes mellitus with other diabetic kidney complication: Secondary | ICD-10-CM | POA: Diagnosis not present

## 2018-01-05 DIAGNOSIS — D509 Iron deficiency anemia, unspecified: Secondary | ICD-10-CM | POA: Diagnosis not present

## 2018-01-05 DIAGNOSIS — Z4901 Encounter for fitting and adjustment of extracorporeal dialysis catheter: Secondary | ICD-10-CM | POA: Diagnosis not present

## 2018-01-05 DIAGNOSIS — N186 End stage renal disease: Secondary | ICD-10-CM | POA: Diagnosis not present

## 2018-01-05 DIAGNOSIS — D689 Coagulation defect, unspecified: Secondary | ICD-10-CM | POA: Diagnosis not present

## 2018-01-07 DIAGNOSIS — Z4901 Encounter for fitting and adjustment of extracorporeal dialysis catheter: Secondary | ICD-10-CM | POA: Diagnosis not present

## 2018-01-07 DIAGNOSIS — N2581 Secondary hyperparathyroidism of renal origin: Secondary | ICD-10-CM | POA: Diagnosis not present

## 2018-01-07 DIAGNOSIS — D689 Coagulation defect, unspecified: Secondary | ICD-10-CM | POA: Diagnosis not present

## 2018-01-07 DIAGNOSIS — D509 Iron deficiency anemia, unspecified: Secondary | ICD-10-CM | POA: Diagnosis not present

## 2018-01-07 DIAGNOSIS — E1129 Type 2 diabetes mellitus with other diabetic kidney complication: Secondary | ICD-10-CM | POA: Diagnosis not present

## 2018-01-07 DIAGNOSIS — N186 End stage renal disease: Secondary | ICD-10-CM | POA: Diagnosis not present

## 2018-01-08 DIAGNOSIS — Z992 Dependence on renal dialysis: Secondary | ICD-10-CM | POA: Diagnosis not present

## 2018-01-08 DIAGNOSIS — N186 End stage renal disease: Secondary | ICD-10-CM | POA: Diagnosis not present

## 2018-01-08 DIAGNOSIS — E1122 Type 2 diabetes mellitus with diabetic chronic kidney disease: Secondary | ICD-10-CM | POA: Diagnosis not present

## 2018-01-10 DIAGNOSIS — N2581 Secondary hyperparathyroidism of renal origin: Secondary | ICD-10-CM | POA: Diagnosis not present

## 2018-01-10 DIAGNOSIS — N186 End stage renal disease: Secondary | ICD-10-CM | POA: Diagnosis not present

## 2018-01-10 DIAGNOSIS — Z4901 Encounter for fitting and adjustment of extracorporeal dialysis catheter: Secondary | ICD-10-CM | POA: Diagnosis not present

## 2018-01-10 DIAGNOSIS — D509 Iron deficiency anemia, unspecified: Secondary | ICD-10-CM | POA: Diagnosis not present

## 2018-01-10 DIAGNOSIS — E1129 Type 2 diabetes mellitus with other diabetic kidney complication: Secondary | ICD-10-CM | POA: Diagnosis not present

## 2018-01-10 DIAGNOSIS — D689 Coagulation defect, unspecified: Secondary | ICD-10-CM | POA: Diagnosis not present

## 2018-01-10 DIAGNOSIS — Z23 Encounter for immunization: Secondary | ICD-10-CM | POA: Diagnosis not present

## 2018-01-10 DIAGNOSIS — D631 Anemia in chronic kidney disease: Secondary | ICD-10-CM | POA: Diagnosis not present

## 2018-01-12 DIAGNOSIS — D631 Anemia in chronic kidney disease: Secondary | ICD-10-CM | POA: Diagnosis not present

## 2018-01-12 DIAGNOSIS — N186 End stage renal disease: Secondary | ICD-10-CM | POA: Diagnosis not present

## 2018-01-12 DIAGNOSIS — D689 Coagulation defect, unspecified: Secondary | ICD-10-CM | POA: Diagnosis not present

## 2018-01-12 DIAGNOSIS — Z4901 Encounter for fitting and adjustment of extracorporeal dialysis catheter: Secondary | ICD-10-CM | POA: Diagnosis not present

## 2018-01-12 DIAGNOSIS — N2581 Secondary hyperparathyroidism of renal origin: Secondary | ICD-10-CM | POA: Diagnosis not present

## 2018-01-12 DIAGNOSIS — Z23 Encounter for immunization: Secondary | ICD-10-CM | POA: Diagnosis not present

## 2018-01-12 DIAGNOSIS — E1129 Type 2 diabetes mellitus with other diabetic kidney complication: Secondary | ICD-10-CM | POA: Diagnosis not present

## 2018-01-12 DIAGNOSIS — D509 Iron deficiency anemia, unspecified: Secondary | ICD-10-CM | POA: Diagnosis not present

## 2018-01-14 DIAGNOSIS — D689 Coagulation defect, unspecified: Secondary | ICD-10-CM | POA: Diagnosis not present

## 2018-01-14 DIAGNOSIS — D631 Anemia in chronic kidney disease: Secondary | ICD-10-CM | POA: Diagnosis not present

## 2018-01-14 DIAGNOSIS — D509 Iron deficiency anemia, unspecified: Secondary | ICD-10-CM | POA: Diagnosis not present

## 2018-01-14 DIAGNOSIS — Z4901 Encounter for fitting and adjustment of extracorporeal dialysis catheter: Secondary | ICD-10-CM | POA: Diagnosis not present

## 2018-01-14 DIAGNOSIS — N2581 Secondary hyperparathyroidism of renal origin: Secondary | ICD-10-CM | POA: Diagnosis not present

## 2018-01-14 DIAGNOSIS — N186 End stage renal disease: Secondary | ICD-10-CM | POA: Diagnosis not present

## 2018-01-14 DIAGNOSIS — E1129 Type 2 diabetes mellitus with other diabetic kidney complication: Secondary | ICD-10-CM | POA: Diagnosis not present

## 2018-01-14 DIAGNOSIS — Z23 Encounter for immunization: Secondary | ICD-10-CM | POA: Diagnosis not present

## 2018-01-17 DIAGNOSIS — D509 Iron deficiency anemia, unspecified: Secondary | ICD-10-CM | POA: Diagnosis not present

## 2018-01-17 DIAGNOSIS — Z4901 Encounter for fitting and adjustment of extracorporeal dialysis catheter: Secondary | ICD-10-CM | POA: Diagnosis not present

## 2018-01-17 DIAGNOSIS — N2581 Secondary hyperparathyroidism of renal origin: Secondary | ICD-10-CM | POA: Diagnosis not present

## 2018-01-17 DIAGNOSIS — D689 Coagulation defect, unspecified: Secondary | ICD-10-CM | POA: Diagnosis not present

## 2018-01-17 DIAGNOSIS — E1129 Type 2 diabetes mellitus with other diabetic kidney complication: Secondary | ICD-10-CM | POA: Diagnosis not present

## 2018-01-17 DIAGNOSIS — N186 End stage renal disease: Secondary | ICD-10-CM | POA: Diagnosis not present

## 2018-01-17 DIAGNOSIS — D631 Anemia in chronic kidney disease: Secondary | ICD-10-CM | POA: Diagnosis not present

## 2018-01-17 DIAGNOSIS — Z23 Encounter for immunization: Secondary | ICD-10-CM | POA: Diagnosis not present

## 2018-01-19 DIAGNOSIS — D509 Iron deficiency anemia, unspecified: Secondary | ICD-10-CM | POA: Diagnosis not present

## 2018-01-19 DIAGNOSIS — N2581 Secondary hyperparathyroidism of renal origin: Secondary | ICD-10-CM | POA: Diagnosis not present

## 2018-01-19 DIAGNOSIS — Z4901 Encounter for fitting and adjustment of extracorporeal dialysis catheter: Secondary | ICD-10-CM | POA: Diagnosis not present

## 2018-01-19 DIAGNOSIS — D689 Coagulation defect, unspecified: Secondary | ICD-10-CM | POA: Diagnosis not present

## 2018-01-19 DIAGNOSIS — N186 End stage renal disease: Secondary | ICD-10-CM | POA: Diagnosis not present

## 2018-01-19 DIAGNOSIS — Z23 Encounter for immunization: Secondary | ICD-10-CM | POA: Diagnosis not present

## 2018-01-19 DIAGNOSIS — D631 Anemia in chronic kidney disease: Secondary | ICD-10-CM | POA: Diagnosis not present

## 2018-01-19 DIAGNOSIS — E1129 Type 2 diabetes mellitus with other diabetic kidney complication: Secondary | ICD-10-CM | POA: Diagnosis not present

## 2018-01-20 ENCOUNTER — Ambulatory Visit (INDEPENDENT_AMBULATORY_CARE_PROVIDER_SITE_OTHER): Payer: Self-pay | Admitting: Vascular Surgery

## 2018-01-20 ENCOUNTER — Encounter: Payer: Self-pay | Admitting: Vascular Surgery

## 2018-01-20 ENCOUNTER — Ambulatory Visit (HOSPITAL_COMMUNITY)
Admission: RE | Admit: 2018-01-20 | Discharge: 2018-01-20 | Disposition: A | Discharge status: Home / Self Care | Payer: Medicare Other | Source: Ambulatory Visit | Attending: Vascular Surgery | Admitting: Vascular Surgery

## 2018-01-20 ENCOUNTER — Other Ambulatory Visit: Payer: Self-pay | Admitting: *Deleted

## 2018-01-20 ENCOUNTER — Encounter: Payer: Self-pay | Admitting: *Deleted

## 2018-01-20 ENCOUNTER — Other Ambulatory Visit: Payer: Self-pay

## 2018-01-20 VITALS — BP 158/87 | HR 89 | Temp 98.1°F | Resp 16 | Ht 72.0 in | Wt 154.0 lb

## 2018-01-20 DIAGNOSIS — N186 End stage renal disease: Secondary | ICD-10-CM

## 2018-01-20 DIAGNOSIS — Z992 Dependence on renal dialysis: Secondary | ICD-10-CM

## 2018-01-20 DIAGNOSIS — Z48812 Encounter for surgical aftercare following surgery on the circulatory system: Secondary | ICD-10-CM

## 2018-01-20 NOTE — Progress Notes (Signed)
Patient ID: Christopher Lopez, male   DOB: Dec 06, 1960, 57 y.o.   MRN: [id number]  Reason for Consult: Chronic Kidney Disease (Dialysis Duplex.  Post-Op.  S/p L 1st stage BVT.  Leaking catheter. Pt does not know the name of his Dialysis Center but dialyzes [facility] )   Referred by Biagio Borg, MD  Subjective:     HPI:  Christopher Lopez is a 57 y.o. male presents for follow-up from recent left arm AV fistula creation.  Preoperative vein mapping did not demonstrate any suitable vein but we found this on her own ultrasound, surgery.  She is not having any hand issues.  His wound has healed well.  He is dialyzing on Tuesday Thursdays and Saturdays via right IJ tunnel catheter.  Past Medical History:  Diagnosis Date  . Alcohol abuse 09/18/2011  . Allergic rhinitis, cause unspecified 09/20/2011  . Anemia, unspecified 09/18/2011  . Arthritis   . Childhood asthma 09/20/2011  . CKD (chronic kidney disease) stage 5, GFR less than 15 ml/min (HCC)   . Colitis 09/18/2011  . Dementia 09/18/2011  . Diabetes mellitus   . Diverticulosis of colon without hemorrhage 02/27/2014  . Foot ulcer ([place]) 09/18/2011  . High cholesterol   . HTN (hypertension) 09/18/2011  . Hyperlipidemia 11/08/2011  . Hypertension   . Impaired glucose tolerance 09/18/2011  . Pneumonia 09/18/2011  . Pre-ulcerative corn or callous 11/20/2011  . Stroke [facility])    left leg deficit  . Type II or unspecified type diabetes mellitus without mention of complication, uncontrolled 09/20/2011   Family History  Problem Relation Age of Onset  . Prostate cancer Paternal Grandfather   . Diabetes Mother   . Heart disease Mother   . Transient ischemic attack Mother   . Diabetes Sister   . Diabetes Maternal Grandfather   . Colon cancer Neg Hx    Past Surgical History:  Procedure Laterality Date  . AV FISTULA PLACEMENT Left 12/23/2017   Procedure: CREATION OF BASILIAC - CEPHALIC  ARTERIOVENOUS (AV) FISTULA  STAGE ONE;  Surgeon: Waynetta Sandy,  MD;  Location: [place];  Service: Vascular;  Laterality: Left;  . INSERTION OF DIALYSIS CATHETER Right 12/23/2017   Procedure: INSERTION OF PALINDROME  DIALYSIS CATHETER;  Surgeon: Waynetta Sandy, MD;  Location: [place];  Service: Vascular;  Laterality: Right;  . TONSILLECTOMY  1985    Short Social History:  Social History   Tobacco Use  . Smoking status: Current Every Day Smoker    Packs/day: 0.50    Types: Cigarettes  . Smokeless tobacco: Never Used  Substance Use Topics  . Alcohol use: Not Currently    Alcohol/week: 1.2 oz    Types: 2 Cans of beer per week    No Known Allergies  Current Outpatient Medications  Medication Sig Dispense Refill  . acetaminophen (TYLENOL) 650 MG CR tablet Take 650 mg by mouth daily.    Marland Kitchen aspirin EC 81 MG tablet Take 1 tablet (81 mg total) by mouth daily. 90 tablet 11  . diltiazem (CARDIZEM SR) 120 MG 12 hr capsule Take 2 capsules (240 mg total) by mouth 2 (two) times daily. 120 capsule 0  . diltiazem (TIAZAC) 360 MG 24 hr capsule Take 360 mg by mouth daily.  5  . Dulaglutide (TRULICITY) 1.5 TG/6.2IR SOPN Inject 0.5 mLs into the skin once a week. 6 mL 3  . glucose blood (ONE TOUCH ULTRA TEST) test strip Use as instructed once daily E11.9 100 each  12  . hydrALAZINE (APRESOLINE) 100 MG tablet Take 100 mg by mouth 3 (three) times daily.  3  . hydrochlorothiazide (HYDRODIURIL) 25 MG tablet Take 1 tablet (25 mg total) by mouth daily. 90 tablet 3  . Lancets (ONETOUCH ULTRASOFT) lancets 1 each by Other route 2 (two) times daily. Use to check blood sugars once a day Dx e11.9 100 each 3  . lovastatin (MEVACOR) 40 MG tablet TAKE 1 TABLET BY MOUTH  DAILY. 90 tablet 2  . metoprolol succinate (TOPROL-XL) 50 MG 24 hr tablet Take 50 mg by mouth daily.    . metoprolol tartrate (LOPRESSOR) 50 MG tablet Take 1 tablet (50 mg total) by mouth 2 (two) times daily. 60 tablet 0  . Multiple Vitamin (MULTIVITAMIN) tablet Take 1 tablet by mouth daily.    . ondansetron  (ZOFRAN) 4 MG tablet Take 1 tablet (4 mg total) by mouth every 8 (eight) hours as needed for nausea or vomiting. 15 tablet 0  . oxyCODONE-acetaminophen (PERCOCET) 5-325 MG tablet Take 1 tablet by mouth every 6 (six) hours as needed for severe pain. 8 tablet 0  . sitaGLIPtin (JANUVIA) 100 MG tablet Take 1 tablet (100 mg total) by mouth daily. 90 tablet 3  . vitamin B-12 (CYANOCOBALAMIN) 1000 MCG tablet Take 1,000 mcg by mouth daily.     No current facility-administered medications for this visit.     Review of Systems  Constitutional:  Constitutional negative. HENT: HENT negative.  Eyes: Eyes negative.  Respiratory: Respiratory negative.  Cardiovascular: Cardiovascular negative.  GI: Gastrointestinal negative.  Musculoskeletal: Musculoskeletal negative.  Skin: Skin negative.  Neurological: Neurological negative. Hematologic: Hematologic/lymphatic negative.  Psychiatric: Psychiatric negative.        Objective:  Objective   Vitals:   01/20/18 1100  BP: (!) 158/87  Pulse: 89  Resp: 16  Temp: 98.1 F (36.7 C)  TempSrc: Oral  SpO2: 100%  Weight: 154 lb (69.9 kg)  Height: 6' (1.829 m)   Body mass index is 20.89 kg/m.  Physical Exam  Constitutional: He is oriented to person, place, and time. He appears well-developed.  HENT:  Head: Normocephalic.  Eyes: Pupils are equal, round, and reactive to light.  Neck: Normal range of motion.  Cardiovascular: Normal rate.  Pulses:      Radial pulses are 2+ on the right side, and 1+ on the left side.  Abdominal: Soft.  Musculoskeletal: Normal range of motion. He exhibits no edema.  Left upper arm AV fistula palpable thrill  Neurological: He is alert and oriented to person, place, and time.  Skin: Skin is warm and dry. Rash: I have independently interpreted his left arm dialysis access duplex which demonstrates flow volumes 1491 mm/min.  The diameter the vein is 0.38 cm at the fossa up to 0.84 cm in the proximal forearm.    Psychiatric: He has a normal mood and affect. His behavior is normal. Judgment and thought content normal.    Data: Left upper arm AV fistula with flow volumes 1491 mL/min     Assessment/Plan:     57 year old male follows up after left first stage basilic vein transposition fistula.  He is on dialysis Tuesday Thursday Saturdays.  He does not take blood thinners.  We will get him set up for a left arm second stage fistula creation in the very near future.  He will continue to dialyze via his tunneled dialysis catheter until then.     Waynetta Sandy MD [facility]

## 2018-01-21 DIAGNOSIS — Z4901 Encounter for fitting and adjustment of extracorporeal dialysis catheter: Secondary | ICD-10-CM | POA: Diagnosis not present

## 2018-01-21 DIAGNOSIS — N186 End stage renal disease: Secondary | ICD-10-CM | POA: Diagnosis not present

## 2018-01-21 DIAGNOSIS — D509 Iron deficiency anemia, unspecified: Secondary | ICD-10-CM | POA: Diagnosis not present

## 2018-01-21 DIAGNOSIS — N2581 Secondary hyperparathyroidism of renal origin: Secondary | ICD-10-CM | POA: Diagnosis not present

## 2018-01-21 DIAGNOSIS — D689 Coagulation defect, unspecified: Secondary | ICD-10-CM | POA: Diagnosis not present

## 2018-01-21 DIAGNOSIS — Z23 Encounter for immunization: Secondary | ICD-10-CM | POA: Diagnosis not present

## 2018-01-21 DIAGNOSIS — E1129 Type 2 diabetes mellitus with other diabetic kidney complication: Secondary | ICD-10-CM | POA: Diagnosis not present

## 2018-01-21 DIAGNOSIS — D631 Anemia in chronic kidney disease: Secondary | ICD-10-CM | POA: Diagnosis not present

## 2018-01-24 DIAGNOSIS — E1129 Type 2 diabetes mellitus with other diabetic kidney complication: Secondary | ICD-10-CM | POA: Diagnosis not present

## 2018-01-24 DIAGNOSIS — D689 Coagulation defect, unspecified: Secondary | ICD-10-CM | POA: Diagnosis not present

## 2018-01-24 DIAGNOSIS — D631 Anemia in chronic kidney disease: Secondary | ICD-10-CM | POA: Diagnosis not present

## 2018-01-24 DIAGNOSIS — D509 Iron deficiency anemia, unspecified: Secondary | ICD-10-CM | POA: Diagnosis not present

## 2018-01-24 DIAGNOSIS — N186 End stage renal disease: Secondary | ICD-10-CM | POA: Diagnosis not present

## 2018-01-24 DIAGNOSIS — N2581 Secondary hyperparathyroidism of renal origin: Secondary | ICD-10-CM | POA: Diagnosis not present

## 2018-01-24 DIAGNOSIS — Z4901 Encounter for fitting and adjustment of extracorporeal dialysis catheter: Secondary | ICD-10-CM | POA: Diagnosis not present

## 2018-01-24 DIAGNOSIS — Z23 Encounter for immunization: Secondary | ICD-10-CM | POA: Diagnosis not present

## 2018-01-26 DIAGNOSIS — Z23 Encounter for immunization: Secondary | ICD-10-CM | POA: Diagnosis not present

## 2018-01-26 DIAGNOSIS — D631 Anemia in chronic kidney disease: Secondary | ICD-10-CM | POA: Diagnosis not present

## 2018-01-26 DIAGNOSIS — E1129 Type 2 diabetes mellitus with other diabetic kidney complication: Secondary | ICD-10-CM | POA: Diagnosis not present

## 2018-01-26 DIAGNOSIS — D689 Coagulation defect, unspecified: Secondary | ICD-10-CM | POA: Diagnosis not present

## 2018-01-26 DIAGNOSIS — N2581 Secondary hyperparathyroidism of renal origin: Secondary | ICD-10-CM | POA: Diagnosis not present

## 2018-01-26 DIAGNOSIS — N186 End stage renal disease: Secondary | ICD-10-CM | POA: Diagnosis not present

## 2018-01-26 DIAGNOSIS — Z4901 Encounter for fitting and adjustment of extracorporeal dialysis catheter: Secondary | ICD-10-CM | POA: Diagnosis not present

## 2018-01-26 DIAGNOSIS — D509 Iron deficiency anemia, unspecified: Secondary | ICD-10-CM | POA: Diagnosis not present

## 2018-01-28 DIAGNOSIS — N2581 Secondary hyperparathyroidism of renal origin: Secondary | ICD-10-CM | POA: Diagnosis not present

## 2018-01-28 DIAGNOSIS — D631 Anemia in chronic kidney disease: Secondary | ICD-10-CM | POA: Diagnosis not present

## 2018-01-28 DIAGNOSIS — Z23 Encounter for immunization: Secondary | ICD-10-CM | POA: Diagnosis not present

## 2018-01-28 DIAGNOSIS — N186 End stage renal disease: Secondary | ICD-10-CM | POA: Diagnosis not present

## 2018-01-28 DIAGNOSIS — D689 Coagulation defect, unspecified: Secondary | ICD-10-CM | POA: Diagnosis not present

## 2018-01-28 DIAGNOSIS — D509 Iron deficiency anemia, unspecified: Secondary | ICD-10-CM | POA: Diagnosis not present

## 2018-01-28 DIAGNOSIS — E1129 Type 2 diabetes mellitus with other diabetic kidney complication: Secondary | ICD-10-CM | POA: Diagnosis not present

## 2018-01-28 DIAGNOSIS — Z4901 Encounter for fitting and adjustment of extracorporeal dialysis catheter: Secondary | ICD-10-CM | POA: Diagnosis not present

## 2018-01-31 ENCOUNTER — Ambulatory Visit: Payer: Medicare Other | Admitting: Internal Medicine

## 2018-01-31 DIAGNOSIS — N186 End stage renal disease: Secondary | ICD-10-CM | POA: Diagnosis not present

## 2018-01-31 DIAGNOSIS — E1129 Type 2 diabetes mellitus with other diabetic kidney complication: Secondary | ICD-10-CM | POA: Diagnosis not present

## 2018-01-31 DIAGNOSIS — D509 Iron deficiency anemia, unspecified: Secondary | ICD-10-CM | POA: Diagnosis not present

## 2018-01-31 DIAGNOSIS — N2581 Secondary hyperparathyroidism of renal origin: Secondary | ICD-10-CM | POA: Diagnosis not present

## 2018-01-31 DIAGNOSIS — D631 Anemia in chronic kidney disease: Secondary | ICD-10-CM | POA: Diagnosis not present

## 2018-01-31 DIAGNOSIS — Z23 Encounter for immunization: Secondary | ICD-10-CM | POA: Diagnosis not present

## 2018-01-31 DIAGNOSIS — Z4901 Encounter for fitting and adjustment of extracorporeal dialysis catheter: Secondary | ICD-10-CM | POA: Diagnosis not present

## 2018-01-31 DIAGNOSIS — D689 Coagulation defect, unspecified: Secondary | ICD-10-CM | POA: Diagnosis not present

## 2018-02-02 ENCOUNTER — Ambulatory Visit: Payer: Medicare Other | Admitting: Internal Medicine

## 2018-02-02 DIAGNOSIS — Z23 Encounter for immunization: Secondary | ICD-10-CM | POA: Diagnosis not present

## 2018-02-02 DIAGNOSIS — Z4901 Encounter for fitting and adjustment of extracorporeal dialysis catheter: Secondary | ICD-10-CM | POA: Diagnosis not present

## 2018-02-02 DIAGNOSIS — D509 Iron deficiency anemia, unspecified: Secondary | ICD-10-CM | POA: Diagnosis not present

## 2018-02-02 DIAGNOSIS — N186 End stage renal disease: Secondary | ICD-10-CM | POA: Diagnosis not present

## 2018-02-02 DIAGNOSIS — N2581 Secondary hyperparathyroidism of renal origin: Secondary | ICD-10-CM | POA: Diagnosis not present

## 2018-02-02 DIAGNOSIS — D689 Coagulation defect, unspecified: Secondary | ICD-10-CM | POA: Diagnosis not present

## 2018-02-02 DIAGNOSIS — D631 Anemia in chronic kidney disease: Secondary | ICD-10-CM | POA: Diagnosis not present

## 2018-02-02 DIAGNOSIS — E1129 Type 2 diabetes mellitus with other diabetic kidney complication: Secondary | ICD-10-CM | POA: Diagnosis not present

## 2018-02-03 ENCOUNTER — Ambulatory Visit: Payer: Medicare Other | Admitting: Internal Medicine

## 2018-02-04 DIAGNOSIS — D631 Anemia in chronic kidney disease: Secondary | ICD-10-CM | POA: Diagnosis not present

## 2018-02-04 DIAGNOSIS — D689 Coagulation defect, unspecified: Secondary | ICD-10-CM | POA: Diagnosis not present

## 2018-02-04 DIAGNOSIS — Z4901 Encounter for fitting and adjustment of extracorporeal dialysis catheter: Secondary | ICD-10-CM | POA: Diagnosis not present

## 2018-02-04 DIAGNOSIS — D509 Iron deficiency anemia, unspecified: Secondary | ICD-10-CM | POA: Diagnosis not present

## 2018-02-04 DIAGNOSIS — Z23 Encounter for immunization: Secondary | ICD-10-CM | POA: Diagnosis not present

## 2018-02-04 DIAGNOSIS — N186 End stage renal disease: Secondary | ICD-10-CM | POA: Diagnosis not present

## 2018-02-04 DIAGNOSIS — E1129 Type 2 diabetes mellitus with other diabetic kidney complication: Secondary | ICD-10-CM | POA: Diagnosis not present

## 2018-02-04 DIAGNOSIS — N2581 Secondary hyperparathyroidism of renal origin: Secondary | ICD-10-CM | POA: Diagnosis not present

## 2018-02-07 DIAGNOSIS — E1129 Type 2 diabetes mellitus with other diabetic kidney complication: Secondary | ICD-10-CM | POA: Diagnosis not present

## 2018-02-07 DIAGNOSIS — D689 Coagulation defect, unspecified: Secondary | ICD-10-CM | POA: Diagnosis not present

## 2018-02-07 DIAGNOSIS — Z4901 Encounter for fitting and adjustment of extracorporeal dialysis catheter: Secondary | ICD-10-CM | POA: Diagnosis not present

## 2018-02-07 DIAGNOSIS — Z23 Encounter for immunization: Secondary | ICD-10-CM | POA: Diagnosis not present

## 2018-02-07 DIAGNOSIS — N186 End stage renal disease: Secondary | ICD-10-CM | POA: Diagnosis not present

## 2018-02-07 DIAGNOSIS — N2581 Secondary hyperparathyroidism of renal origin: Secondary | ICD-10-CM | POA: Diagnosis not present

## 2018-02-07 DIAGNOSIS — D631 Anemia in chronic kidney disease: Secondary | ICD-10-CM | POA: Diagnosis not present

## 2018-02-07 DIAGNOSIS — D509 Iron deficiency anemia, unspecified: Secondary | ICD-10-CM | POA: Diagnosis not present

## 2018-02-08 ENCOUNTER — Ambulatory Visit (INDEPENDENT_AMBULATORY_CARE_PROVIDER_SITE_OTHER): Payer: Medicare Other | Admitting: Internal Medicine

## 2018-02-08 ENCOUNTER — Encounter: Payer: Self-pay | Admitting: Internal Medicine

## 2018-02-08 VITALS — BP 126/72 | HR 71 | Temp 98.3°F | Ht 72.0 in | Wt 159.0 lb

## 2018-02-08 DIAGNOSIS — E1122 Type 2 diabetes mellitus with diabetic chronic kidney disease: Secondary | ICD-10-CM | POA: Diagnosis not present

## 2018-02-08 DIAGNOSIS — N186 End stage renal disease: Secondary | ICD-10-CM | POA: Insufficient documentation

## 2018-02-08 DIAGNOSIS — I10 Essential (primary) hypertension: Secondary | ICD-10-CM | POA: Diagnosis not present

## 2018-02-08 DIAGNOSIS — E1129 Type 2 diabetes mellitus with other diabetic kidney complication: Secondary | ICD-10-CM

## 2018-02-08 DIAGNOSIS — Z992 Dependence on renal dialysis: Secondary | ICD-10-CM

## 2018-02-08 DIAGNOSIS — H9193 Unspecified hearing loss, bilateral: Secondary | ICD-10-CM

## 2018-02-08 DIAGNOSIS — Z0000 Encounter for general adult medical examination without abnormal findings: Secondary | ICD-10-CM

## 2018-02-08 NOTE — Patient Instructions (Signed)
Your ears were cleared of wax today  Please continue all other medications as before, and refills have been done if requested.  Please have the pharmacy call with any other refills you may need.  Please continue your efforts at being more active, low cholesterol diet, and weight control.  Please keep your appointments with your specialists as you may have planned  Please return in 6 months, or sooner if needed, with Lab testing done 3-5 days before

## 2018-02-08 NOTE — Assessment & Plan Note (Signed)
Improved with wax irrigation,  to f/u any worsening symptoms or concerns

## 2018-02-08 NOTE — Assessment & Plan Note (Signed)
stable overall by history and exam, recent data reviewed with pt, and pt to continue medical treatment as before,  to f/u any worsening symptoms or concerns  

## 2018-02-08 NOTE — Assessment & Plan Note (Signed)
Stable, to cont as planned

## 2018-02-08 NOTE — Progress Notes (Signed)
Subjective:    Patient ID: Christopher Lopez, male    DOB: Feb 11, 1961, 57 y.o.   MRN: [id number]  HPI  Here to f/u; overall doing ok,  Pt denies chest pain, increasing sob or doe, wheezing, orthopnea, PND, increased LE swelling, palpitations, dizziness or syncope.  Pt denies new neurological symptoms such as new headache, or facial or extremity weakness or numbness.  Pt denies polydipsia, polyuria, or low sugar episode.  Pt states overall good compliance with meds, mostly trying to follow appropriate diet, with wt overall stable,  but little exercise however. Tolerating trulicity. Now on HD tues-thur sat for about 6 wks per catheter, to have left arm AV graft finished .  Denies worsening reflux, abd pain, dysphagia, n/v, bowel change or blood except for constipation worsening recently, some better with more fruit in diet. Plasn to see optho soon.  BP improved after start dialysis and had episode of n/v with elevated BP, now resolved.   Does have increased hearing loss in the past several wks per mother with whom he lives - ? Wax again Past Medical History:  Diagnosis Date  . Alcohol abuse 09/18/2011  . Allergic rhinitis, cause unspecified 09/20/2011  . Anemia, unspecified 09/18/2011  . Arthritis   . Childhood asthma 09/20/2011  . CKD (chronic kidney disease) stage 5, GFR less than 15 ml/min (HCC)   . Colitis 09/18/2011  . Dementia 09/18/2011  . Diabetes mellitus   . Diverticulosis of colon without hemorrhage 02/27/2014  . Foot ulcer ([place]) 09/18/2011  . High cholesterol   . HTN (hypertension) 09/18/2011  . Hyperlipidemia 11/08/2011  . Hypertension   . Impaired glucose tolerance 09/18/2011  . Pneumonia 09/18/2011  . Pre-ulcerative corn or callous 11/20/2011  . Stroke [facility])    left leg deficit  . Type II or unspecified type diabetes mellitus without mention of complication, uncontrolled 09/20/2011   Past Surgical History:  Procedure Laterality Date  . AV FISTULA PLACEMENT Left 12/23/2017   Procedure: CREATION  OF BASILIAC - CEPHALIC  ARTERIOVENOUS (AV) FISTULA  STAGE ONE;  Surgeon: Waynetta Sandy, MD;  Location: [place];  Service: Vascular;  Laterality: Left;  . INSERTION OF DIALYSIS CATHETER Right 12/23/2017   Procedure: INSERTION OF PALINDROME  DIALYSIS CATHETER;  Surgeon: Waynetta Sandy, MD;  Location: [place];  Service: Vascular;  Laterality: Right;  . TONSILLECTOMY  1985    reports that he has been smoking cigarettes.  He has been smoking about 0.50 packs per day. He has never used smokeless tobacco. He reports that he drank about 1.2 oz of alcohol per week. He reports that he does not use drugs. family history includes Diabetes in his maternal grandfather, mother, and sister; Heart disease in his mother; Prostate cancer in his paternal grandfather; Transient ischemic attack in his mother. No Known Allergies Current Outpatient Medications on File Prior to Visit  Medication Sig Dispense Refill  . acetaminophen (TYLENOL) 650 MG CR tablet Take 650 mg by mouth daily.    Marland Kitchen aspirin EC 81 MG tablet Take 1 tablet (81 mg total) by mouth daily. 90 tablet 11  . diltiazem (CARDIZEM SR) 120 MG 12 hr capsule Take 2 capsules (240 mg total) by mouth 2 (two) times daily. 120 capsule 0  . diltiazem (TIAZAC) 360 MG 24 hr capsule Take 360 mg by mouth daily.  5  . Dulaglutide (TRULICITY) 1.5 HK/7.4QV SOPN Inject 0.5 mLs into the skin once a week. 6 mL 3  . glucose blood (ONE TOUCH ULTRA  TEST) test strip Use as instructed once daily E11.9 100 each 12  . hydrALAZINE (APRESOLINE) 100 MG tablet Take 100 mg by mouth 3 (three) times daily.  3  . hydrochlorothiazide (HYDRODIURIL) 25 MG tablet Take 1 tablet (25 mg total) by mouth daily. 90 tablet 3  . Lancets (ONETOUCH ULTRASOFT) lancets 1 each by Other route 2 (two) times daily. Use to check blood sugars once a day Dx e11.9 100 each 3  . lovastatin (MEVACOR) 40 MG tablet TAKE 1 TABLET BY MOUTH  DAILY. 90 tablet 2  . metoprolol succinate (TOPROL-XL) 50 MG 24  hr tablet Take 50 mg by mouth daily.    . metoprolol tartrate (LOPRESSOR) 50 MG tablet Take 1 tablet (50 mg total) by mouth 2 (two) times daily. 60 tablet 0  . Multiple Vitamin (MULTIVITAMIN) tablet Take 1 tablet by mouth daily.    . ondansetron (ZOFRAN) 4 MG tablet Take 1 tablet (4 mg total) by mouth every 8 (eight) hours as needed for nausea or vomiting. 15 tablet 0  . oxyCODONE-acetaminophen (PERCOCET) 5-325 MG tablet Take 1 tablet by mouth every 6 (six) hours as needed for severe pain. 8 tablet 0  . sitaGLIPtin (JANUVIA) 100 MG tablet Take 1 tablet (100 mg total) by mouth daily. 90 tablet 3  . vitamin B-12 (CYANOCOBALAMIN) 1000 MCG tablet Take 1,000 mcg by mouth daily.     No current facility-administered medications on file prior to visit.    Review of Systems  Constitutional: Negative for other unusual diaphoresis or sweats HENT: Negative for ear discharge or swelling Eyes: Negative for other worsening visual disturbances Respiratory: Negative for stridor or other swelling  Gastrointestinal: Negative for worsening distension or other blood Genitourinary: Negative for retention or other urinary change Musculoskeletal: Negative for other MSK pain or swelling Skin: Negative for color change or other new lesions Neurological: Negative for worsening tremors and other numbness  Psychiatric/Behavioral: Negative for worsening agitation or other fatigue All other system neg per pt    Objective:   Physical Exam BP 126/72   Pulse 71   Temp 98.3 F (36.8 C) (Oral)   Ht 6' (1.829 m)   Wt 159 lb (72.1 kg)   SpO2 98%   BMI 21.56 kg/m  VS noted, not ill appearing Constitutional: Pt appears in NAD HENT: Head: NCAT.  Right Ear: External ear normal.  Left Ear: External ear normal.  bilat wax impactions resolved with irrigation, hearing improved Eyes: . Pupils are equal, round, and reactive to light. Conjunctivae and EOM are normal Nose: without d/c or deformity Neck: Neck supple. Gross  normal ROM Cardiovascular: Normal rate and regular rhythm.   Pulmonary/Chest: Effort normal and breath sounds without rales or wheezing.  Abd:  Soft, NT, ND, + BS, no organomegaly Neurological: Pt is alert. At baseline orientation, motor grossly intact Skin: Skin is warm. No rashes, other new lesions, no LE edema Psychiatric: Pt behavior is normal without agitation  No other exam findings Lab Results  Component Value Date   WBC 18.4 (H) 12/25/2017   HGB 8.8 (L) 12/25/2017   HCT 27.7 (L) 12/25/2017   PLT 329 12/25/2017   GLUCOSE 164 (H) 12/25/2017   CHOL 220 (H) 08/11/2017   TRIG 251.0 (H) 08/11/2017   HDL 38.50 (L) 08/11/2017   LDLDIRECT 131.0 08/11/2017   LDLCALC 84 08/05/2016   ALT 6 (L) 12/25/2017   AST 15 12/25/2017   NA 135 12/25/2017   K 4.1 12/25/2017   CL 100 (L) 12/25/2017  CREATININE 7.67 (H) 12/25/2017   BUN 39 (H) 12/25/2017   CO2 23 12/25/2017   TSH 0.73 08/11/2017   PSA 0.55 08/11/2017   HGBA1C 5.3 12/23/2017   MICROALBUR 98.3 (H) 08/11/2017       Assessment & Plan:

## 2018-02-09 DIAGNOSIS — D689 Coagulation defect, unspecified: Secondary | ICD-10-CM | POA: Diagnosis not present

## 2018-02-09 DIAGNOSIS — D631 Anemia in chronic kidney disease: Secondary | ICD-10-CM | POA: Diagnosis not present

## 2018-02-09 DIAGNOSIS — N2581 Secondary hyperparathyroidism of renal origin: Secondary | ICD-10-CM | POA: Diagnosis not present

## 2018-02-09 DIAGNOSIS — Z23 Encounter for immunization: Secondary | ICD-10-CM | POA: Diagnosis not present

## 2018-02-09 DIAGNOSIS — D509 Iron deficiency anemia, unspecified: Secondary | ICD-10-CM | POA: Diagnosis not present

## 2018-02-09 DIAGNOSIS — N186 End stage renal disease: Secondary | ICD-10-CM | POA: Diagnosis not present

## 2018-02-09 DIAGNOSIS — Z4901 Encounter for fitting and adjustment of extracorporeal dialysis catheter: Secondary | ICD-10-CM | POA: Diagnosis not present

## 2018-02-10 ENCOUNTER — Other Ambulatory Visit: Payer: Self-pay | Admitting: *Deleted

## 2018-02-11 DIAGNOSIS — D689 Coagulation defect, unspecified: Secondary | ICD-10-CM | POA: Diagnosis not present

## 2018-02-11 DIAGNOSIS — N186 End stage renal disease: Secondary | ICD-10-CM | POA: Diagnosis not present

## 2018-02-11 DIAGNOSIS — Z4901 Encounter for fitting and adjustment of extracorporeal dialysis catheter: Secondary | ICD-10-CM | POA: Diagnosis not present

## 2018-02-11 DIAGNOSIS — D631 Anemia in chronic kidney disease: Secondary | ICD-10-CM | POA: Diagnosis not present

## 2018-02-11 DIAGNOSIS — Z23 Encounter for immunization: Secondary | ICD-10-CM | POA: Diagnosis not present

## 2018-02-11 DIAGNOSIS — N2581 Secondary hyperparathyroidism of renal origin: Secondary | ICD-10-CM | POA: Diagnosis not present

## 2018-02-11 DIAGNOSIS — D509 Iron deficiency anemia, unspecified: Secondary | ICD-10-CM | POA: Diagnosis not present

## 2018-02-14 DIAGNOSIS — Z4901 Encounter for fitting and adjustment of extracorporeal dialysis catheter: Secondary | ICD-10-CM | POA: Diagnosis not present

## 2018-02-14 DIAGNOSIS — N186 End stage renal disease: Secondary | ICD-10-CM | POA: Diagnosis not present

## 2018-02-14 DIAGNOSIS — D689 Coagulation defect, unspecified: Secondary | ICD-10-CM | POA: Diagnosis not present

## 2018-02-14 DIAGNOSIS — D631 Anemia in chronic kidney disease: Secondary | ICD-10-CM | POA: Diagnosis not present

## 2018-02-14 DIAGNOSIS — Z23 Encounter for immunization: Secondary | ICD-10-CM | POA: Diagnosis not present

## 2018-02-14 DIAGNOSIS — N2581 Secondary hyperparathyroidism of renal origin: Secondary | ICD-10-CM | POA: Diagnosis not present

## 2018-02-14 DIAGNOSIS — D509 Iron deficiency anemia, unspecified: Secondary | ICD-10-CM | POA: Diagnosis not present

## 2018-02-16 DIAGNOSIS — N2581 Secondary hyperparathyroidism of renal origin: Secondary | ICD-10-CM | POA: Diagnosis not present

## 2018-02-16 DIAGNOSIS — Z23 Encounter for immunization: Secondary | ICD-10-CM | POA: Diagnosis not present

## 2018-02-16 DIAGNOSIS — N186 End stage renal disease: Secondary | ICD-10-CM | POA: Diagnosis not present

## 2018-02-16 DIAGNOSIS — Z4901 Encounter for fitting and adjustment of extracorporeal dialysis catheter: Secondary | ICD-10-CM | POA: Diagnosis not present

## 2018-02-16 DIAGNOSIS — D631 Anemia in chronic kidney disease: Secondary | ICD-10-CM | POA: Diagnosis not present

## 2018-02-16 DIAGNOSIS — D509 Iron deficiency anemia, unspecified: Secondary | ICD-10-CM | POA: Diagnosis not present

## 2018-02-16 DIAGNOSIS — D689 Coagulation defect, unspecified: Secondary | ICD-10-CM | POA: Diagnosis not present

## 2018-02-18 DIAGNOSIS — D631 Anemia in chronic kidney disease: Secondary | ICD-10-CM | POA: Diagnosis not present

## 2018-02-18 DIAGNOSIS — Z4901 Encounter for fitting and adjustment of extracorporeal dialysis catheter: Secondary | ICD-10-CM | POA: Diagnosis not present

## 2018-02-18 DIAGNOSIS — D509 Iron deficiency anemia, unspecified: Secondary | ICD-10-CM | POA: Diagnosis not present

## 2018-02-18 DIAGNOSIS — Z23 Encounter for immunization: Secondary | ICD-10-CM | POA: Diagnosis not present

## 2018-02-18 DIAGNOSIS — N186 End stage renal disease: Secondary | ICD-10-CM | POA: Diagnosis not present

## 2018-02-18 DIAGNOSIS — D689 Coagulation defect, unspecified: Secondary | ICD-10-CM | POA: Diagnosis not present

## 2018-02-18 DIAGNOSIS — N2581 Secondary hyperparathyroidism of renal origin: Secondary | ICD-10-CM | POA: Diagnosis not present

## 2018-02-21 DIAGNOSIS — D631 Anemia in chronic kidney disease: Secondary | ICD-10-CM | POA: Diagnosis not present

## 2018-02-21 DIAGNOSIS — N186 End stage renal disease: Secondary | ICD-10-CM | POA: Diagnosis not present

## 2018-02-21 DIAGNOSIS — D509 Iron deficiency anemia, unspecified: Secondary | ICD-10-CM | POA: Diagnosis not present

## 2018-02-21 DIAGNOSIS — N2581 Secondary hyperparathyroidism of renal origin: Secondary | ICD-10-CM | POA: Diagnosis not present

## 2018-02-21 DIAGNOSIS — D689 Coagulation defect, unspecified: Secondary | ICD-10-CM | POA: Diagnosis not present

## 2018-02-21 DIAGNOSIS — Z4901 Encounter for fitting and adjustment of extracorporeal dialysis catheter: Secondary | ICD-10-CM | POA: Diagnosis not present

## 2018-02-21 DIAGNOSIS — Z23 Encounter for immunization: Secondary | ICD-10-CM | POA: Diagnosis not present

## 2018-02-23 DIAGNOSIS — N186 End stage renal disease: Secondary | ICD-10-CM | POA: Diagnosis not present

## 2018-02-23 DIAGNOSIS — Z23 Encounter for immunization: Secondary | ICD-10-CM | POA: Diagnosis not present

## 2018-02-23 DIAGNOSIS — D509 Iron deficiency anemia, unspecified: Secondary | ICD-10-CM | POA: Diagnosis not present

## 2018-02-23 DIAGNOSIS — D689 Coagulation defect, unspecified: Secondary | ICD-10-CM | POA: Diagnosis not present

## 2018-02-23 DIAGNOSIS — Z4901 Encounter for fitting and adjustment of extracorporeal dialysis catheter: Secondary | ICD-10-CM | POA: Diagnosis not present

## 2018-02-23 DIAGNOSIS — N2581 Secondary hyperparathyroidism of renal origin: Secondary | ICD-10-CM | POA: Diagnosis not present

## 2018-02-23 DIAGNOSIS — D631 Anemia in chronic kidney disease: Secondary | ICD-10-CM | POA: Diagnosis not present

## 2018-02-25 DIAGNOSIS — D631 Anemia in chronic kidney disease: Secondary | ICD-10-CM | POA: Diagnosis not present

## 2018-02-25 DIAGNOSIS — D509 Iron deficiency anemia, unspecified: Secondary | ICD-10-CM | POA: Diagnosis not present

## 2018-02-25 DIAGNOSIS — N2581 Secondary hyperparathyroidism of renal origin: Secondary | ICD-10-CM | POA: Diagnosis not present

## 2018-02-25 DIAGNOSIS — N186 End stage renal disease: Secondary | ICD-10-CM | POA: Diagnosis not present

## 2018-02-25 DIAGNOSIS — Z4901 Encounter for fitting and adjustment of extracorporeal dialysis catheter: Secondary | ICD-10-CM | POA: Diagnosis not present

## 2018-02-25 DIAGNOSIS — Z23 Encounter for immunization: Secondary | ICD-10-CM | POA: Diagnosis not present

## 2018-02-25 DIAGNOSIS — D689 Coagulation defect, unspecified: Secondary | ICD-10-CM | POA: Diagnosis not present

## 2018-02-27 ENCOUNTER — Encounter (HOSPITAL_COMMUNITY): Payer: Self-pay | Admitting: *Deleted

## 2018-02-27 ENCOUNTER — Other Ambulatory Visit: Payer: Self-pay

## 2018-02-27 NOTE — Progress Notes (Signed)
Spoke with pt's mother, Daiel Strohecker for pre-op call. DPR on file. Pt has dementia. Mrs. Goto denies any cardiac history, he has had a stroke in the past and still has left leg weakness. Pt is a type 2 Diabetic. Last A1C was 5.3 on 12/23/08. Instructed Mrs. Goracke not to give pt Januvia the day of surgery. Instructed Mrs. Vukelich to have pt check his blood sugar Wednesday AM when he gets up and every 2 hours until he leaves for the hospital. If blood sugar is 70 or below, treat with 1/2 cup of clear juice (apple or cranberry) and recheck blood sugar 15 minutes after drinking juice. If blood sugar continues to be 70 or below, call the Short Stay department and ask to speak to a nurse. Mrs.Dorr voiced understanding.

## 2018-02-28 DIAGNOSIS — D689 Coagulation defect, unspecified: Secondary | ICD-10-CM | POA: Diagnosis not present

## 2018-02-28 DIAGNOSIS — N2581 Secondary hyperparathyroidism of renal origin: Secondary | ICD-10-CM | POA: Diagnosis not present

## 2018-02-28 DIAGNOSIS — D509 Iron deficiency anemia, unspecified: Secondary | ICD-10-CM | POA: Diagnosis not present

## 2018-02-28 DIAGNOSIS — D631 Anemia in chronic kidney disease: Secondary | ICD-10-CM | POA: Diagnosis not present

## 2018-02-28 DIAGNOSIS — N186 End stage renal disease: Secondary | ICD-10-CM | POA: Diagnosis not present

## 2018-02-28 DIAGNOSIS — Z4901 Encounter for fitting and adjustment of extracorporeal dialysis catheter: Secondary | ICD-10-CM | POA: Diagnosis not present

## 2018-02-28 DIAGNOSIS — Z23 Encounter for immunization: Secondary | ICD-10-CM | POA: Diagnosis not present

## 2018-03-01 ENCOUNTER — Ambulatory Visit (HOSPITAL_COMMUNITY)
Admission: RE | Admit: 2018-03-01 | Discharge: 2018-03-01 | Disposition: A | Discharge status: Home / Self Care | Payer: Medicare Other | Source: Ambulatory Visit | Attending: Vascular Surgery | Admitting: Vascular Surgery

## 2018-03-01 ENCOUNTER — Ambulatory Visit (HOSPITAL_COMMUNITY): Payer: Medicare Other | Admitting: Anesthesiology

## 2018-03-01 ENCOUNTER — Encounter (HOSPITAL_COMMUNITY): Payer: Self-pay | Admitting: Urology

## 2018-03-01 ENCOUNTER — Telehealth: Payer: Self-pay | Admitting: Vascular Surgery

## 2018-03-01 ENCOUNTER — Encounter (HOSPITAL_COMMUNITY)
Admission: RE | Disposition: A | Discharge status: Home / Self Care | Payer: Self-pay | Source: Ambulatory Visit | Attending: Vascular Surgery

## 2018-03-01 DIAGNOSIS — N185 Chronic kidney disease, stage 5: Secondary | ICD-10-CM | POA: Diagnosis not present

## 2018-03-01 DIAGNOSIS — Z7982 Long term (current) use of aspirin: Secondary | ICD-10-CM | POA: Diagnosis not present

## 2018-03-01 DIAGNOSIS — E7800 Pure hypercholesterolemia, unspecified: Secondary | ICD-10-CM | POA: Insufficient documentation

## 2018-03-01 DIAGNOSIS — Z79899 Other long term (current) drug therapy: Secondary | ICD-10-CM | POA: Insufficient documentation

## 2018-03-01 DIAGNOSIS — N186 End stage renal disease: Secondary | ICD-10-CM | POA: Insufficient documentation

## 2018-03-01 DIAGNOSIS — Z7984 Long term (current) use of oral hypoglycemic drugs: Secondary | ICD-10-CM | POA: Diagnosis not present

## 2018-03-01 DIAGNOSIS — F17210 Nicotine dependence, cigarettes, uncomplicated: Secondary | ICD-10-CM | POA: Diagnosis not present

## 2018-03-01 DIAGNOSIS — Z992 Dependence on renal dialysis: Secondary | ICD-10-CM | POA: Diagnosis not present

## 2018-03-01 DIAGNOSIS — I69398 Other sequelae of cerebral infarction: Secondary | ICD-10-CM | POA: Insufficient documentation

## 2018-03-01 DIAGNOSIS — I120 Hypertensive chronic kidney disease with stage 5 chronic kidney disease or end stage renal disease: Secondary | ICD-10-CM | POA: Diagnosis not present

## 2018-03-01 DIAGNOSIS — F0390 Unspecified dementia without behavioral disturbance: Secondary | ICD-10-CM | POA: Diagnosis not present

## 2018-03-01 DIAGNOSIS — E1122 Type 2 diabetes mellitus with diabetic chronic kidney disease: Secondary | ICD-10-CM | POA: Insufficient documentation

## 2018-03-01 HISTORY — PX: BASCILIC VEIN TRANSPOSITION: SHX5742

## 2018-03-01 LAB — POCT I-STAT 4, (NA,K, GLUC, HGB,HCT)
GLUCOSE: 99 mg/dL (ref 70–99)
HCT: 35 % — ABNORMAL LOW (ref 39.0–52.0)
HEMOGLOBIN: 11.9 g/dL — AB (ref 13.0–17.0)
POTASSIUM: 3.9 mmol/L (ref 3.5–5.1)
SODIUM: 135 mmol/L (ref 135–145)

## 2018-03-01 SURGERY — TRANSPOSITION, VEIN, BASILIC
Anesthesia: General | Laterality: Left

## 2018-03-01 MED ORDER — SODIUM CHLORIDE 0.9 % IV SOLN
INTRAVENOUS | Status: AC
  Filled 2018-03-01: qty 1.2

## 2018-03-01 MED ORDER — ONDANSETRON HCL 4 MG/2ML IJ SOLN
INTRAMUSCULAR | Status: AC
  Filled 2018-03-01: qty 2

## 2018-03-01 MED ORDER — FENTANYL CITRATE (PF) 250 MCG/5ML IJ SOLN
INTRAMUSCULAR | Status: AC
  Filled 2018-03-01: qty 5

## 2018-03-01 MED ORDER — MIDAZOLAM HCL 2 MG/2ML IJ SOLN
INTRAMUSCULAR | Status: AC
  Filled 2018-03-01: qty 2

## 2018-03-01 MED ORDER — HEPARIN SODIUM (PORCINE) 1000 UNIT/ML IJ SOLN
INTRAMUSCULAR | Status: DC | PRN
  Administered 2018-03-01: 5000 [IU] via INTRAVENOUS

## 2018-03-01 MED ORDER — CEFAZOLIN SODIUM-DEXTROSE 2-4 GM/100ML-% IV SOLN
2.0000 g | INTRAVENOUS | Status: AC
  Administered 2018-03-01: 2 g via INTRAVENOUS
  Filled 2018-03-01: qty 100

## 2018-03-01 MED ORDER — PROPOFOL 10 MG/ML IV BOLUS
INTRAVENOUS | Status: DC | PRN
  Administered 2018-03-01: 170 mg via INTRAVENOUS

## 2018-03-01 MED ORDER — ONDANSETRON HCL 4 MG/2ML IJ SOLN: 4.0000 mg | Freq: Once | INTRAMUSCULAR | Status: DC | PRN

## 2018-03-01 MED ORDER — PHENYLEPHRINE 40 MCG/ML (10ML) SYRINGE FOR IV PUSH (FOR BLOOD PRESSURE SUPPORT)
PREFILLED_SYRINGE | INTRAVENOUS | Status: AC
  Filled 2018-03-01: qty 10

## 2018-03-01 MED ORDER — LIDOCAINE 2% (20 MG/ML) 5 ML SYRINGE
INTRAMUSCULAR | Status: DC | PRN
  Administered 2018-03-01: 60 mg via INTRAVENOUS

## 2018-03-01 MED ORDER — LIDOCAINE-EPINEPHRINE (PF) 1 %-1:200000 IJ SOLN
INTRAMUSCULAR | Status: AC
  Filled 2018-03-01: qty 30

## 2018-03-01 MED ORDER — PHENYLEPHRINE 40 MCG/ML (10ML) SYRINGE FOR IV PUSH (FOR BLOOD PRESSURE SUPPORT)
PREFILLED_SYRINGE | INTRAVENOUS | Status: DC | PRN
  Administered 2018-03-01: 120 ug via INTRAVENOUS
  Administered 2018-03-01 (×3): 80 ug via INTRAVENOUS
  Administered 2018-03-01: 120 ug via INTRAVENOUS
  Administered 2018-03-01: 80 ug via INTRAVENOUS
  Administered 2018-03-01: 120 ug via INTRAVENOUS
  Administered 2018-03-01: 80 ug via INTRAVENOUS
  Administered 2018-03-01: 120 ug via INTRAVENOUS
  Administered 2018-03-01 (×2): 80 ug via INTRAVENOUS
  Administered 2018-03-01: 40 ug via INTRAVENOUS
  Administered 2018-03-01: 120 ug via INTRAVENOUS

## 2018-03-01 MED ORDER — OXYCODONE-ACETAMINOPHEN 5-325 MG PO TABS: 1.0000 | ORAL_TABLET | Freq: Four times a day (QID) | ORAL | 0 refills | Status: DC | PRN

## 2018-03-01 MED ORDER — PROPOFOL 10 MG/ML IV BOLUS
INTRAVENOUS | Status: AC
  Filled 2018-03-01: qty 20

## 2018-03-01 MED ORDER — OXYCODONE HCL 5 MG/5ML PO SOLN: 5.0000 mg | Freq: Once | ORAL | Status: AC | PRN

## 2018-03-01 MED ORDER — SODIUM CHLORIDE 0.9 % IV SOLN
INTRAVENOUS | Status: DC | PRN
  Administered 2018-03-01 (×2): via INTRAVENOUS

## 2018-03-01 MED ORDER — 0.9 % SODIUM CHLORIDE (POUR BTL) OPTIME
TOPICAL | Status: DC | PRN
  Administered 2018-03-01: 1000 mL

## 2018-03-01 MED ORDER — OXYCODONE HCL 5 MG PO TABS
5.0000 mg | ORAL_TABLET | Freq: Once | ORAL | Status: AC | PRN
  Administered 2018-03-01: 5 mg via ORAL

## 2018-03-01 MED ORDER — FENTANYL CITRATE (PF) 100 MCG/2ML IJ SOLN: 25.0000 ug | INTRAMUSCULAR | Status: DC | PRN

## 2018-03-01 MED ORDER — OXYCODONE HCL 5 MG PO TABS
ORAL_TABLET | ORAL | Status: AC
  Filled 2018-03-01: qty 1

## 2018-03-01 MED ORDER — LIDOCAINE 2% (20 MG/ML) 5 ML SYRINGE
INTRAMUSCULAR | Status: AC
  Filled 2018-03-01: qty 5

## 2018-03-01 MED ORDER — SODIUM CHLORIDE 0.9 % IV SOLN: INTRAVENOUS | Status: DC

## 2018-03-01 MED ORDER — MIDAZOLAM HCL 2 MG/2ML IJ SOLN
INTRAMUSCULAR | Status: DC | PRN
  Administered 2018-03-01: 1 mg via INTRAVENOUS

## 2018-03-01 MED ORDER — FENTANYL CITRATE (PF) 100 MCG/2ML IJ SOLN: INTRAMUSCULAR | Status: DC | PRN

## 2018-03-01 MED ORDER — FENTANYL CITRATE (PF) 250 MCG/5ML IJ SOLN
INTRAMUSCULAR | Status: DC | PRN
  Administered 2018-03-01: 25 ug via INTRAVENOUS
  Administered 2018-03-01: 50 ug via INTRAVENOUS
  Administered 2018-03-01: 25 ug via INTRAVENOUS
  Administered 2018-03-01: 50 ug via INTRAVENOUS

## 2018-03-01 MED ORDER — SODIUM CHLORIDE 0.9 % IV SOLN
INTRAVENOUS | Status: DC | PRN
  Administered 2018-03-01: 09:00:00

## 2018-03-01 SURGICAL SUPPLY — 36 items
ADH SKN CLS APL DERMABOND .7 (GAUZE/BANDAGES/DRESSINGS) ×1
ARMBAND PINK RESTRICT EXTREMIT (MISCELLANEOUS) ×3 IMPLANT
CANISTER SUCT 3000ML PPV (MISCELLANEOUS) ×3 IMPLANT
CLIP VESOCCLUDE MED 24/CT (CLIP) ×2 IMPLANT
CLIP VESOCCLUDE MED 6/CT (CLIP) IMPLANT
CLIP VESOCCLUDE SM WIDE 24/CT (CLIP) ×2 IMPLANT
CLIP VESOCCLUDE SM WIDE 6/CT (CLIP) IMPLANT
COVER PROBE W GEL 5X96 (DRAPES) ×3 IMPLANT
DERMABOND ADVANCED (GAUZE/BANDAGES/DRESSINGS) ×2
DERMABOND ADVANCED .7 DNX12 (GAUZE/BANDAGES/DRESSINGS) ×1 IMPLANT
ELECT REM PT RETURN 9FT ADLT (ELECTROSURGICAL) ×3
ELECTRODE REM PT RTRN 9FT ADLT (ELECTROSURGICAL) ×1 IMPLANT
GLOVE BIO SURGEON STRL SZ7.5 (GLOVE) ×3 IMPLANT
GLOVE BIO SURGEON STRL SZ8 (GLOVE) ×2 IMPLANT
GLOVE BIOGEL PI IND STRL 6.5 (GLOVE) IMPLANT
GLOVE BIOGEL PI IND STRL 7.5 (GLOVE) IMPLANT
GLOVE BIOGEL PI INDICATOR 6.5 (GLOVE) ×2
GLOVE BIOGEL PI INDICATOR 7.5 (GLOVE) ×2
GLOVE ECLIPSE 7.0 STRL STRAW (GLOVE) ×2 IMPLANT
GOWN STRL REUS W/ TWL LRG LVL3 (GOWN DISPOSABLE) ×2 IMPLANT
GOWN STRL REUS W/ TWL XL LVL3 (GOWN DISPOSABLE) ×1 IMPLANT
GOWN STRL REUS W/TWL LRG LVL3 (GOWN DISPOSABLE) ×9
GOWN STRL REUS W/TWL XL LVL3 (GOWN DISPOSABLE) ×3
KIT BASIN OR (CUSTOM PROCEDURE TRAY) ×3 IMPLANT
KIT TURNOVER KIT B (KITS) ×3 IMPLANT
NS IRRIG 1000ML POUR BTL (IV SOLUTION) ×3 IMPLANT
PACK CV ACCESS (CUSTOM PROCEDURE TRAY) ×3 IMPLANT
PAD ARMBOARD 7.5X6 YLW CONV (MISCELLANEOUS) ×6 IMPLANT
SUT MNCRL AB 4-0 PS2 18 (SUTURE) ×5 IMPLANT
SUT PROLENE 6 0 BV (SUTURE) ×13 IMPLANT
SUT SILK 2 0 SH (SUTURE) IMPLANT
SUT VIC AB 3-0 SH 27 (SUTURE) ×6
SUT VIC AB 3-0 SH 27X BRD (SUTURE) ×1 IMPLANT
TOWEL GREEN STERILE (TOWEL DISPOSABLE) ×3 IMPLANT
UNDERPAD 30X30 (UNDERPADS AND DIAPERS) ×3 IMPLANT
WATER STERILE IRR 1000ML POUR (IV SOLUTION) ×3 IMPLANT

## 2018-03-01 NOTE — H&P (Signed)
   History and Physical Update  The patient was interviewed and re-examined.  The patient's previous History and Physical has been reviewed and is unchanged from recent office visit. Plan for left arm second stage bvt.   Ashritha Desrosiers C. Donzetta Matters, MD [facility] [place] Office: [phone] Pager: [phone]  03/01/2018, 8:19 AM

## 2018-03-01 NOTE — Discharge Instructions (Signed)
[facility]  Discharge Instructions  AV Fistula or Graft Surgery for Dialysis Access  Please refer to the following instructions for your post-procedure care. Your surgeon or physician assistant will discuss any changes with you.  Activity  You may drive the day following your surgery, if you are comfortable and no longer taking prescription pain medication. Resume full activity as the soreness in your incision resolves.  Bathing/Showering  You may shower after you go home. Keep your incision dry for 48 hours. Do not soak in a bathtub, hot tub, or swim until the incision heals completely. You may not shower if you have a hemodialysis catheter.  Incision Care  Clean your incision with mild soap and water after 48 hours. Pat the area dry with a clean towel. You do not need a bandage unless otherwise instructed. Do not apply any ointments or creams to your incision. You may have skin glue on your incision. Do not peel it off. It will come off on its own in about one week. Your arm may swell a bit after surgery. To reduce swelling use pillows to elevate your arm so it is above your heart. Your doctor will tell you if you need to lightly wrap your arm with an ACE bandage.  Diet  Resume your normal diet. There are not special food restrictions following this procedure. In order to heal from your surgery, it is CRITICAL to get adequate nutrition. Your body requires vitamins, minerals, and protein. Vegetables are the best source of vitamins and minerals. Vegetables also provide the perfect balance of protein. Processed food has little nutritional value, so try to avoid this.  Medications  Resume taking all of your medications. If your incision is causing pain, you may take over-the counter pain relievers such as acetaminophen (Tylenol). If you were prescribed a stronger pain medication, please be aware these medications can cause nausea and constipation. Prevent  nausea by taking the medication with a snack or meal. Avoid constipation by drinking plenty of fluids and eating foods with high amount of fiber, such as fruits, vegetables, and grains. Do not take Tylenol if you are taking prescription pain medications.     Follow up Your surgeon may want to see you in the office following your access surgery. If so, this will be arranged at the time of your surgery.  Please call us immediately for any of the following conditions:  Increased pain, redness, drainage (pus) from your incision site Fever of 101 degrees or higher Severe or worsening pain at your incision site Hand pain or numbness.  Reduce your risk of vascular disease:  Stop smoking. If you would like help, call [facility] at 1-800-QUIT-NOW 567-195-1458) or [place] at [place] your cholesterol Maintain a desired weight Control your diabetes Keep your blood pressure down  Dialysis  It will take several weeks to several months for your new dialysis access to be ready for use. Your surgeon will determine when it is OK to use it. Your nephrologist will continue to direct your dialysis. You can continue to use your Permcath until your new access is ready for use.  If you have any questions, please call the office at [phone].   Post Anesthesia Home Care Instructions  Activity: Get plenty of rest for the remainder of the day. A responsible individual must stay with you for 24 hours following the procedure.  For the next 24 hours, DO NOT: -Drive a car -Paediatric nurse -Drink alcoholic  beverages -Take any medication unless instructed by your physician -Make any legal decisions or sign important papers.  Meals: Start with liquid foods such as gelatin or soup. Progress to regular foods as tolerated. Avoid greasy, spicy, heavy foods. If nausea and/or vomiting occur, drink only clear liquids until the nausea and/or vomiting subsides. Call your physician if  vomiting continues.  Special Instructions/Symptoms: Your throat may feel dry or sore from the anesthesia or the breathing tube placed in your throat during surgery. If this causes discomfort, gargle with warm salt water. The discomfort should disappear within 24 hours.  If you had a scopolamine patch placed behind your ear for the management of post- operative nausea and/or vomiting:  1. The medication in the patch is effective for 72 hours, after which it should be removed.  Wrap patch in a tissue and discard in the trash. Wash hands thoroughly with soap and water. 2. You may remove the patch earlier than 72 hours if you experience unpleasant side effects which may include dry mouth, dizziness or visual disturbances. 3. Avoid touching the patch. Wash your hands with soap and water after contact with the patch.

## 2018-03-01 NOTE — Anesthesia Procedure Notes (Signed)
Procedure Name: LMA Insertion Date/Time: 03/01/2018 8:48 AM Performed by: Barrington Ellison, CRNA Pre-anesthesia Checklist: Patient identified, Emergency Drugs available, Suction available and Patient being monitored Patient Re-evaluated:Patient Re-evaluated prior to induction Oxygen Delivery Method: Circle System Utilized Preoxygenation: Pre-oxygenation with 100% oxygen Induction Type: IV induction Ventilation: Mask ventilation without difficulty LMA: LMA inserted LMA Size: 4.0 Number of attempts: 1 Placement Confirmation: positive ETCO2 Tube secured with: Tape Dental Injury: Teeth and Oropharynx as per pre-operative assessment

## 2018-03-01 NOTE — Op Note (Signed)
    Patient name: Christopher Lopez MRN: [id number] DOB: 1961/06/14 Sex: male  03/01/2018 Pre-operative Diagnosis: End-stage renal disease Post-operative diagnosis:  Same Surgeon:  Erlene Quan C. Donzetta Matters, MD Assistant: Laurence Slate, PA Procedure Performed: Left upper extremity second stage basilic vein transposition fistula   Indications: 57 year old male has a history of end-stage renal disease currently on dialysis via right IJ tunneled catheter.  He has a previously created left upper extremity basilic vein fistula.  On his preoperative mapping he did not have any suitable vein but there was a marginal basilic vein and it has matured nicely.  He is now indicated for second stage procedure.  Findings: Basilic vein throughout the upper arm had multiple branches that were divided between clips and ties.  At the proximal area of the fistula it was quite sclerotic and with clamping it did rip the vein.  Thankfully we had enough vein for tunneling to create a new arteriovenous anastomosis.   Procedure:  The patient was identified in the holding area and taken to room the operating room where he was placed supine on the operative table and general anesthesia was induced.  He was sterilely prepped and draped in the left upper extremity usual fashion given antibiotics and a timeout was called.  We began with a longitudinal incision overlying the palpable thrill just above the antecubitum.  We dissected down onto the fistula.  The nerve was overlying this and this was protected.  Branches were taken between clips and ties and there were multiple of them.  A second skin incision was made in the axilla.  We again dissected out the vein taking the branches between clips and ties until the entire thing was mobilized.  Proximally I did have to extend the incision to the antecubitum through the previous incision.  I clamped the vein into ends and divided it.  We then flushed with heparinized saline I did have to repair one  area with 6-0 Prolene suture.  Upon spatulating the proximal area of the fistula was noted to be quite sclerotic.  I moved more proximally on and when clamping it it did have a large rent all the way to the level of the anastomosis.  Pressure was held we then dissected out the brachial artery proximally distally and placed clamps on this.  The proximal aspect of the fistula was then removed as we trimmed back to the previous anastomotic site.  Patient at this time was given 5000 units of heparin.  We then spent tunneled the fistula and flushed that it did flush easily.  It was marked for orientation spatulated and sewn end to side to the artery with 6-0 Prolene suture.  Prior to completion anastomosis we allowed flushing all directions.  Upon completion he had a palpable radial pulse and a very strong thrill in the runoff vein.  These were both confirmed with Doppler.  We then irrigated the wounds closed in layers with Vicryl Monocryl.  He was allowed away from anesthesia having tolerated procedure without any comp occasion.  All counts were correct at completion.  EBL: 120 cc.   Brandon C. Donzetta Matters, MD [facility] [place] Office: [phone] Pager: [phone]

## 2018-03-01 NOTE — Transfer of Care (Signed)
Immediate Anesthesia Transfer of Care Note  Patient: Christopher Lopez  Procedure(s) Performed: BASILIC VEIN TRANSPOSITION SECOND STAGE (Left )  Patient Location: PACU  Anesthesia Type:General  Level of Consciousness: awake, drowsy and patient cooperative  Airway & Oxygen Therapy: Patient Spontanous Breathing and Patient connected to nasal cannula oxygen  Post-op Assessment: Report given to RN and Post -op Vital signs reviewed and stable  Post vital signs: Reviewed and stable  Last Vitals:  Vitals Value Taken Time  BP 109/64 03/01/2018 11:05 AM  Temp    Pulse 60 03/01/2018 11:08 AM  Resp 15 03/01/2018 11:08 AM  SpO2 98 % 03/01/2018 11:08 AM  Vitals shown include unvalidated device data.  Last Pain:  Vitals:   03/01/18 0736  TempSrc:   PainSc: 0-No pain      Patients Stated Pain Goal: 0 (72/07/21 8288)  Complications: No apparent anesthesia complications

## 2018-03-01 NOTE — Telephone Encounter (Signed)
sch appt spk to pt mother 03/24/18 2pm p/o PA

## 2018-03-01 NOTE — Anesthesia Preprocedure Evaluation (Addendum)
Anesthesia Evaluation  Patient identified by MRN, date of birth, ID band Patient awake    Reviewed: Allergy & Precautions, NPO status , Patient's Chart, lab work & pertinent test results  Airway Mallampati: II  TM Distance: >3 FB Neck ROM: Full    Dental  (+) Teeth Intact, Dental Advisory Given   Pulmonary Current Smoker,    breath sounds clear to auscultation       Cardiovascular hypertension,  Rhythm:Regular Rate:Normal     Neuro/Psych    GI/Hepatic   Endo/Other  diabetes  Renal/GU      Musculoskeletal   Abdominal   Peds  Hematology   Anesthesia Other Findings   Reproductive/Obstetrics                            Anesthesia Physical Anesthesia Plan  ASA: III  Anesthesia Plan: General   Post-op Pain Management:    Induction: Intravenous  PONV Risk Score and Plan: 1 and Ondansetron  Airway Management Planned: Oral ETT  Additional Equipment:   Intra-op Plan:   Post-operative Plan:   Informed Consent: I have reviewed the patients History and Physical, chart, labs and discussed the procedure including the risks, benefits and alternatives for the proposed anesthesia with the patient or authorized representative who has indicated his/her understanding and acceptance.   Dental advisory given  Plan Discussed with: CRNA and Anesthesiologist  Anesthesia Plan Comments:         Anesthesia Quick Evaluation

## 2018-03-01 NOTE — Anesthesia Postprocedure Evaluation (Signed)
Anesthesia Post Note  Patient: Christopher Lopez  Procedure(s) Performed: BASILIC VEIN TRANSPOSITION SECOND STAGE (Left )     Patient location during evaluation: PACU Anesthesia Type: General Level of consciousness: awake and alert Pain management: pain level controlled Vital Signs Assessment: post-procedure vital signs reviewed and stable Respiratory status: spontaneous breathing, nonlabored ventilation, respiratory function stable and patient connected to nasal cannula oxygen Cardiovascular status: blood pressure returned to baseline and stable Postop Assessment: no apparent nausea or vomiting Anesthetic complications: no    Last Vitals:  Vitals:   03/01/18 1235 03/01/18 1247  BP: (!) 100/57 106/62  Pulse: (!) 51 (!) 52  Resp: 11 18  Temp: (!) 36.4 C 36.4 C  SpO2: 98% 97%    Last Pain:  Vitals:   03/01/18 1247  TempSrc:   PainSc: 4                  Mayrene Bastarache COKER

## 2018-03-02 ENCOUNTER — Encounter (HOSPITAL_COMMUNITY): Payer: Self-pay | Admitting: Vascular Surgery

## 2018-03-02 DIAGNOSIS — D631 Anemia in chronic kidney disease: Secondary | ICD-10-CM | POA: Diagnosis not present

## 2018-03-02 DIAGNOSIS — Z4901 Encounter for fitting and adjustment of extracorporeal dialysis catheter: Secondary | ICD-10-CM | POA: Diagnosis not present

## 2018-03-02 DIAGNOSIS — D509 Iron deficiency anemia, unspecified: Secondary | ICD-10-CM | POA: Diagnosis not present

## 2018-03-02 DIAGNOSIS — D689 Coagulation defect, unspecified: Secondary | ICD-10-CM | POA: Diagnosis not present

## 2018-03-02 DIAGNOSIS — N186 End stage renal disease: Secondary | ICD-10-CM | POA: Diagnosis not present

## 2018-03-02 DIAGNOSIS — N2581 Secondary hyperparathyroidism of renal origin: Secondary | ICD-10-CM | POA: Diagnosis not present

## 2018-03-02 DIAGNOSIS — Z23 Encounter for immunization: Secondary | ICD-10-CM | POA: Diagnosis not present

## 2018-03-02 LAB — GLUCOSE, CAPILLARY: Glucose-Capillary: 89 mg/dL (ref 70–99)

## 2018-03-04 DIAGNOSIS — D631 Anemia in chronic kidney disease: Secondary | ICD-10-CM | POA: Diagnosis not present

## 2018-03-04 DIAGNOSIS — N186 End stage renal disease: Secondary | ICD-10-CM | POA: Diagnosis not present

## 2018-03-04 DIAGNOSIS — N2581 Secondary hyperparathyroidism of renal origin: Secondary | ICD-10-CM | POA: Diagnosis not present

## 2018-03-04 DIAGNOSIS — D689 Coagulation defect, unspecified: Secondary | ICD-10-CM | POA: Diagnosis not present

## 2018-03-04 DIAGNOSIS — Z4901 Encounter for fitting and adjustment of extracorporeal dialysis catheter: Secondary | ICD-10-CM | POA: Diagnosis not present

## 2018-03-04 DIAGNOSIS — D509 Iron deficiency anemia, unspecified: Secondary | ICD-10-CM | POA: Diagnosis not present

## 2018-03-04 DIAGNOSIS — Z23 Encounter for immunization: Secondary | ICD-10-CM | POA: Diagnosis not present

## 2018-03-07 DIAGNOSIS — D631 Anemia in chronic kidney disease: Secondary | ICD-10-CM | POA: Diagnosis not present

## 2018-03-07 DIAGNOSIS — D509 Iron deficiency anemia, unspecified: Secondary | ICD-10-CM | POA: Diagnosis not present

## 2018-03-07 DIAGNOSIS — N2581 Secondary hyperparathyroidism of renal origin: Secondary | ICD-10-CM | POA: Diagnosis not present

## 2018-03-07 DIAGNOSIS — D689 Coagulation defect, unspecified: Secondary | ICD-10-CM | POA: Diagnosis not present

## 2018-03-07 DIAGNOSIS — N186 End stage renal disease: Secondary | ICD-10-CM | POA: Diagnosis not present

## 2018-03-07 DIAGNOSIS — Z23 Encounter for immunization: Secondary | ICD-10-CM | POA: Diagnosis not present

## 2018-03-07 DIAGNOSIS — Z4901 Encounter for fitting and adjustment of extracorporeal dialysis catheter: Secondary | ICD-10-CM | POA: Diagnosis not present

## 2018-03-09 DIAGNOSIS — N186 End stage renal disease: Secondary | ICD-10-CM | POA: Diagnosis not present

## 2018-03-09 DIAGNOSIS — D689 Coagulation defect, unspecified: Secondary | ICD-10-CM | POA: Diagnosis not present

## 2018-03-09 DIAGNOSIS — Z4901 Encounter for fitting and adjustment of extracorporeal dialysis catheter: Secondary | ICD-10-CM | POA: Diagnosis not present

## 2018-03-09 DIAGNOSIS — Z23 Encounter for immunization: Secondary | ICD-10-CM | POA: Diagnosis not present

## 2018-03-09 DIAGNOSIS — D509 Iron deficiency anemia, unspecified: Secondary | ICD-10-CM | POA: Diagnosis not present

## 2018-03-09 DIAGNOSIS — D631 Anemia in chronic kidney disease: Secondary | ICD-10-CM | POA: Diagnosis not present

## 2018-03-09 DIAGNOSIS — N2581 Secondary hyperparathyroidism of renal origin: Secondary | ICD-10-CM | POA: Diagnosis not present

## 2018-03-11 DIAGNOSIS — Z4901 Encounter for fitting and adjustment of extracorporeal dialysis catheter: Secondary | ICD-10-CM | POA: Diagnosis not present

## 2018-03-11 DIAGNOSIS — E1122 Type 2 diabetes mellitus with diabetic chronic kidney disease: Secondary | ICD-10-CM | POA: Diagnosis not present

## 2018-03-11 DIAGNOSIS — D509 Iron deficiency anemia, unspecified: Secondary | ICD-10-CM | POA: Diagnosis not present

## 2018-03-11 DIAGNOSIS — N2581 Secondary hyperparathyroidism of renal origin: Secondary | ICD-10-CM | POA: Diagnosis not present

## 2018-03-11 DIAGNOSIS — D631 Anemia in chronic kidney disease: Secondary | ICD-10-CM | POA: Diagnosis not present

## 2018-03-11 DIAGNOSIS — Z23 Encounter for immunization: Secondary | ICD-10-CM | POA: Diagnosis not present

## 2018-03-11 DIAGNOSIS — Z992 Dependence on renal dialysis: Secondary | ICD-10-CM | POA: Diagnosis not present

## 2018-03-11 DIAGNOSIS — D689 Coagulation defect, unspecified: Secondary | ICD-10-CM | POA: Diagnosis not present

## 2018-03-11 DIAGNOSIS — N186 End stage renal disease: Secondary | ICD-10-CM | POA: Diagnosis not present

## 2018-03-14 DIAGNOSIS — D509 Iron deficiency anemia, unspecified: Secondary | ICD-10-CM | POA: Diagnosis not present

## 2018-03-14 DIAGNOSIS — Z4901 Encounter for fitting and adjustment of extracorporeal dialysis catheter: Secondary | ICD-10-CM | POA: Diagnosis not present

## 2018-03-14 DIAGNOSIS — Z23 Encounter for immunization: Secondary | ICD-10-CM | POA: Diagnosis not present

## 2018-03-14 DIAGNOSIS — D689 Coagulation defect, unspecified: Secondary | ICD-10-CM | POA: Diagnosis not present

## 2018-03-14 DIAGNOSIS — N2581 Secondary hyperparathyroidism of renal origin: Secondary | ICD-10-CM | POA: Diagnosis not present

## 2018-03-14 DIAGNOSIS — N186 End stage renal disease: Secondary | ICD-10-CM | POA: Diagnosis not present

## 2018-03-14 DIAGNOSIS — D631 Anemia in chronic kidney disease: Secondary | ICD-10-CM | POA: Diagnosis not present

## 2018-03-16 DIAGNOSIS — D509 Iron deficiency anemia, unspecified: Secondary | ICD-10-CM | POA: Diagnosis not present

## 2018-03-16 DIAGNOSIS — D631 Anemia in chronic kidney disease: Secondary | ICD-10-CM | POA: Diagnosis not present

## 2018-03-16 DIAGNOSIS — N2581 Secondary hyperparathyroidism of renal origin: Secondary | ICD-10-CM | POA: Diagnosis not present

## 2018-03-16 DIAGNOSIS — Z23 Encounter for immunization: Secondary | ICD-10-CM | POA: Diagnosis not present

## 2018-03-16 DIAGNOSIS — Z4901 Encounter for fitting and adjustment of extracorporeal dialysis catheter: Secondary | ICD-10-CM | POA: Diagnosis not present

## 2018-03-16 DIAGNOSIS — D689 Coagulation defect, unspecified: Secondary | ICD-10-CM | POA: Diagnosis not present

## 2018-03-16 DIAGNOSIS — N186 End stage renal disease: Secondary | ICD-10-CM | POA: Diagnosis not present

## 2018-03-18 DIAGNOSIS — Z4901 Encounter for fitting and adjustment of extracorporeal dialysis catheter: Secondary | ICD-10-CM | POA: Diagnosis not present

## 2018-03-18 DIAGNOSIS — N2581 Secondary hyperparathyroidism of renal origin: Secondary | ICD-10-CM | POA: Diagnosis not present

## 2018-03-18 DIAGNOSIS — Z23 Encounter for immunization: Secondary | ICD-10-CM | POA: Diagnosis not present

## 2018-03-18 DIAGNOSIS — N186 End stage renal disease: Secondary | ICD-10-CM | POA: Diagnosis not present

## 2018-03-18 DIAGNOSIS — D689 Coagulation defect, unspecified: Secondary | ICD-10-CM | POA: Diagnosis not present

## 2018-03-18 DIAGNOSIS — D509 Iron deficiency anemia, unspecified: Secondary | ICD-10-CM | POA: Diagnosis not present

## 2018-03-18 DIAGNOSIS — D631 Anemia in chronic kidney disease: Secondary | ICD-10-CM | POA: Diagnosis not present

## 2018-03-21 ENCOUNTER — Ambulatory Visit: Payer: Medicare Other | Admitting: Sports Medicine

## 2018-03-21 DIAGNOSIS — N186 End stage renal disease: Secondary | ICD-10-CM | POA: Diagnosis not present

## 2018-03-21 DIAGNOSIS — D689 Coagulation defect, unspecified: Secondary | ICD-10-CM | POA: Diagnosis not present

## 2018-03-21 DIAGNOSIS — Z4901 Encounter for fitting and adjustment of extracorporeal dialysis catheter: Secondary | ICD-10-CM | POA: Diagnosis not present

## 2018-03-21 DIAGNOSIS — D631 Anemia in chronic kidney disease: Secondary | ICD-10-CM | POA: Diagnosis not present

## 2018-03-21 DIAGNOSIS — N2581 Secondary hyperparathyroidism of renal origin: Secondary | ICD-10-CM | POA: Diagnosis not present

## 2018-03-21 DIAGNOSIS — Z23 Encounter for immunization: Secondary | ICD-10-CM | POA: Diagnosis not present

## 2018-03-21 DIAGNOSIS — D509 Iron deficiency anemia, unspecified: Secondary | ICD-10-CM | POA: Diagnosis not present

## 2018-03-23 DIAGNOSIS — D631 Anemia in chronic kidney disease: Secondary | ICD-10-CM | POA: Diagnosis not present

## 2018-03-23 DIAGNOSIS — Z4901 Encounter for fitting and adjustment of extracorporeal dialysis catheter: Secondary | ICD-10-CM | POA: Diagnosis not present

## 2018-03-23 DIAGNOSIS — D689 Coagulation defect, unspecified: Secondary | ICD-10-CM | POA: Diagnosis not present

## 2018-03-23 DIAGNOSIS — N2581 Secondary hyperparathyroidism of renal origin: Secondary | ICD-10-CM | POA: Diagnosis not present

## 2018-03-23 DIAGNOSIS — N186 End stage renal disease: Secondary | ICD-10-CM | POA: Diagnosis not present

## 2018-03-23 DIAGNOSIS — D509 Iron deficiency anemia, unspecified: Secondary | ICD-10-CM | POA: Diagnosis not present

## 2018-03-23 DIAGNOSIS — Z23 Encounter for immunization: Secondary | ICD-10-CM | POA: Diagnosis not present

## 2018-03-24 ENCOUNTER — Ambulatory Visit (INDEPENDENT_AMBULATORY_CARE_PROVIDER_SITE_OTHER): Payer: Self-pay | Admitting: Physician Assistant

## 2018-03-24 ENCOUNTER — Other Ambulatory Visit: Payer: Self-pay

## 2018-03-24 VITALS — BP 127/67 | HR 82 | Temp 98.3°F | Resp 16 | Ht 72.0 in | Wt 158.0 lb

## 2018-03-24 DIAGNOSIS — Z992 Dependence on renal dialysis: Secondary | ICD-10-CM

## 2018-03-24 DIAGNOSIS — N186 End stage renal disease: Secondary | ICD-10-CM

## 2018-03-24 NOTE — Progress Notes (Signed)
    Postoperative Access Visit   History of Present Illness   Christopher Lopez is a 57 y.o. year old male who presents for postoperative follow-up for: left second stage basilic vein transposition by Dr. Donzetta Matters (Date: 03/01/18).  The patient's wounds are  healed.  The patient denies steal symptoms.  The patient is able to complete their activities of daily living.  He is currently dialyzing from R IJ TDC on TTS schedule without complication.  Physical Examination   Vitals:   03/24/18 1328  BP: 127/67  Pulse: 82  Resp: 16  Temp: 98.3 F (36.8 C)  TempSrc: Oral  SpO2: 100%  Weight: 158 lb (71.7 kg)  Height: 6' (1.829 m)   Body mass index is 21.43 kg/m.  left arm Incision is healed, hand grip is 5/5, sensation in digits is intact, palpable thrill, bruit can be auscultated; persistent edema near L AC fossa incision; palpable L radial pulse     Medical Decision Making   Christopher Lopez is a 57 y.o. year old male who presents s/p left second stage basilic vein transposition   The patient's access will be ready for use 04/13/18 due to persistent edema and post operative pain  The patient's tunneled dialysis catheter can be removed when Nephrology is comfortable with the performance of the fistula  He may follow up on an as needed basis    Dagoberto Ligas PA-C [facility] [place] Office: [phone]

## 2018-03-25 DIAGNOSIS — Z4901 Encounter for fitting and adjustment of extracorporeal dialysis catheter: Secondary | ICD-10-CM | POA: Diagnosis not present

## 2018-03-25 DIAGNOSIS — D509 Iron deficiency anemia, unspecified: Secondary | ICD-10-CM | POA: Diagnosis not present

## 2018-03-25 DIAGNOSIS — N186 End stage renal disease: Secondary | ICD-10-CM | POA: Diagnosis not present

## 2018-03-25 DIAGNOSIS — D689 Coagulation defect, unspecified: Secondary | ICD-10-CM | POA: Diagnosis not present

## 2018-03-25 DIAGNOSIS — N2581 Secondary hyperparathyroidism of renal origin: Secondary | ICD-10-CM | POA: Diagnosis not present

## 2018-03-25 DIAGNOSIS — Z23 Encounter for immunization: Secondary | ICD-10-CM | POA: Diagnosis not present

## 2018-03-25 DIAGNOSIS — D631 Anemia in chronic kidney disease: Secondary | ICD-10-CM | POA: Diagnosis not present

## 2018-03-28 DIAGNOSIS — Z23 Encounter for immunization: Secondary | ICD-10-CM | POA: Diagnosis not present

## 2018-03-28 DIAGNOSIS — D631 Anemia in chronic kidney disease: Secondary | ICD-10-CM | POA: Diagnosis not present

## 2018-03-28 DIAGNOSIS — N186 End stage renal disease: Secondary | ICD-10-CM | POA: Diagnosis not present

## 2018-03-28 DIAGNOSIS — D689 Coagulation defect, unspecified: Secondary | ICD-10-CM | POA: Diagnosis not present

## 2018-03-28 DIAGNOSIS — D509 Iron deficiency anemia, unspecified: Secondary | ICD-10-CM | POA: Diagnosis not present

## 2018-03-28 DIAGNOSIS — Z4901 Encounter for fitting and adjustment of extracorporeal dialysis catheter: Secondary | ICD-10-CM | POA: Diagnosis not present

## 2018-03-28 DIAGNOSIS — N2581 Secondary hyperparathyroidism of renal origin: Secondary | ICD-10-CM | POA: Diagnosis not present

## 2018-03-30 DIAGNOSIS — D631 Anemia in chronic kidney disease: Secondary | ICD-10-CM | POA: Diagnosis not present

## 2018-03-30 DIAGNOSIS — Z4901 Encounter for fitting and adjustment of extracorporeal dialysis catheter: Secondary | ICD-10-CM | POA: Diagnosis not present

## 2018-03-30 DIAGNOSIS — D689 Coagulation defect, unspecified: Secondary | ICD-10-CM | POA: Diagnosis not present

## 2018-03-30 DIAGNOSIS — N186 End stage renal disease: Secondary | ICD-10-CM | POA: Diagnosis not present

## 2018-03-30 DIAGNOSIS — D509 Iron deficiency anemia, unspecified: Secondary | ICD-10-CM | POA: Diagnosis not present

## 2018-03-30 DIAGNOSIS — Z23 Encounter for immunization: Secondary | ICD-10-CM | POA: Diagnosis not present

## 2018-03-30 DIAGNOSIS — N2581 Secondary hyperparathyroidism of renal origin: Secondary | ICD-10-CM | POA: Diagnosis not present

## 2018-04-01 DIAGNOSIS — D631 Anemia in chronic kidney disease: Secondary | ICD-10-CM | POA: Diagnosis not present

## 2018-04-01 DIAGNOSIS — N2581 Secondary hyperparathyroidism of renal origin: Secondary | ICD-10-CM | POA: Diagnosis not present

## 2018-04-01 DIAGNOSIS — Z4901 Encounter for fitting and adjustment of extracorporeal dialysis catheter: Secondary | ICD-10-CM | POA: Diagnosis not present

## 2018-04-01 DIAGNOSIS — N186 End stage renal disease: Secondary | ICD-10-CM | POA: Diagnosis not present

## 2018-04-01 DIAGNOSIS — D689 Coagulation defect, unspecified: Secondary | ICD-10-CM | POA: Diagnosis not present

## 2018-04-01 DIAGNOSIS — Z23 Encounter for immunization: Secondary | ICD-10-CM | POA: Diagnosis not present

## 2018-04-01 DIAGNOSIS — D509 Iron deficiency anemia, unspecified: Secondary | ICD-10-CM | POA: Diagnosis not present

## 2018-04-04 DIAGNOSIS — Z4901 Encounter for fitting and adjustment of extracorporeal dialysis catheter: Secondary | ICD-10-CM | POA: Diagnosis not present

## 2018-04-04 DIAGNOSIS — N2581 Secondary hyperparathyroidism of renal origin: Secondary | ICD-10-CM | POA: Diagnosis not present

## 2018-04-04 DIAGNOSIS — Z23 Encounter for immunization: Secondary | ICD-10-CM | POA: Diagnosis not present

## 2018-04-04 DIAGNOSIS — D689 Coagulation defect, unspecified: Secondary | ICD-10-CM | POA: Diagnosis not present

## 2018-04-04 DIAGNOSIS — D631 Anemia in chronic kidney disease: Secondary | ICD-10-CM | POA: Diagnosis not present

## 2018-04-04 DIAGNOSIS — N186 End stage renal disease: Secondary | ICD-10-CM | POA: Diagnosis not present

## 2018-04-04 DIAGNOSIS — D509 Iron deficiency anemia, unspecified: Secondary | ICD-10-CM | POA: Diagnosis not present

## 2018-04-06 DIAGNOSIS — Z23 Encounter for immunization: Secondary | ICD-10-CM | POA: Diagnosis not present

## 2018-04-06 DIAGNOSIS — D509 Iron deficiency anemia, unspecified: Secondary | ICD-10-CM | POA: Diagnosis not present

## 2018-04-06 DIAGNOSIS — Z4901 Encounter for fitting and adjustment of extracorporeal dialysis catheter: Secondary | ICD-10-CM | POA: Diagnosis not present

## 2018-04-06 DIAGNOSIS — N186 End stage renal disease: Secondary | ICD-10-CM | POA: Diagnosis not present

## 2018-04-06 DIAGNOSIS — D631 Anemia in chronic kidney disease: Secondary | ICD-10-CM | POA: Diagnosis not present

## 2018-04-06 DIAGNOSIS — N2581 Secondary hyperparathyroidism of renal origin: Secondary | ICD-10-CM | POA: Diagnosis not present

## 2018-04-06 DIAGNOSIS — D689 Coagulation defect, unspecified: Secondary | ICD-10-CM | POA: Diagnosis not present

## 2018-04-07 ENCOUNTER — Other Ambulatory Visit: Payer: Self-pay | Admitting: Internal Medicine

## 2018-04-08 DIAGNOSIS — D689 Coagulation defect, unspecified: Secondary | ICD-10-CM | POA: Diagnosis not present

## 2018-04-08 DIAGNOSIS — Z23 Encounter for immunization: Secondary | ICD-10-CM | POA: Diagnosis not present

## 2018-04-08 DIAGNOSIS — D509 Iron deficiency anemia, unspecified: Secondary | ICD-10-CM | POA: Diagnosis not present

## 2018-04-08 DIAGNOSIS — Z4901 Encounter for fitting and adjustment of extracorporeal dialysis catheter: Secondary | ICD-10-CM | POA: Diagnosis not present

## 2018-04-08 DIAGNOSIS — N2581 Secondary hyperparathyroidism of renal origin: Secondary | ICD-10-CM | POA: Diagnosis not present

## 2018-04-08 DIAGNOSIS — D631 Anemia in chronic kidney disease: Secondary | ICD-10-CM | POA: Diagnosis not present

## 2018-04-08 DIAGNOSIS — N186 End stage renal disease: Secondary | ICD-10-CM | POA: Diagnosis not present

## 2018-04-10 DIAGNOSIS — E1122 Type 2 diabetes mellitus with diabetic chronic kidney disease: Secondary | ICD-10-CM | POA: Diagnosis not present

## 2018-04-10 DIAGNOSIS — Z992 Dependence on renal dialysis: Secondary | ICD-10-CM | POA: Diagnosis not present

## 2018-04-10 DIAGNOSIS — N186 End stage renal disease: Secondary | ICD-10-CM | POA: Diagnosis not present

## 2018-04-11 DIAGNOSIS — D631 Anemia in chronic kidney disease: Secondary | ICD-10-CM | POA: Diagnosis not present

## 2018-04-11 DIAGNOSIS — Z4901 Encounter for fitting and adjustment of extracorporeal dialysis catheter: Secondary | ICD-10-CM | POA: Diagnosis not present

## 2018-04-11 DIAGNOSIS — E1129 Type 2 diabetes mellitus with other diabetic kidney complication: Secondary | ICD-10-CM | POA: Diagnosis not present

## 2018-04-11 DIAGNOSIS — D689 Coagulation defect, unspecified: Secondary | ICD-10-CM | POA: Diagnosis not present

## 2018-04-11 DIAGNOSIS — D509 Iron deficiency anemia, unspecified: Secondary | ICD-10-CM | POA: Diagnosis not present

## 2018-04-11 DIAGNOSIS — N186 End stage renal disease: Secondary | ICD-10-CM | POA: Diagnosis not present

## 2018-04-11 DIAGNOSIS — N2581 Secondary hyperparathyroidism of renal origin: Secondary | ICD-10-CM | POA: Diagnosis not present

## 2018-04-12 ENCOUNTER — Ambulatory Visit: Payer: Medicare Other | Admitting: Podiatry

## 2018-04-12 DIAGNOSIS — M79676 Pain in unspecified toe(s): Secondary | ICD-10-CM

## 2018-04-12 DIAGNOSIS — B351 Tinea unguium: Secondary | ICD-10-CM

## 2018-04-12 DIAGNOSIS — Q828 Other specified congenital malformations of skin: Secondary | ICD-10-CM | POA: Diagnosis not present

## 2018-04-12 DIAGNOSIS — E1142 Type 2 diabetes mellitus with diabetic polyneuropathy: Secondary | ICD-10-CM

## 2018-04-12 NOTE — Progress Notes (Signed)
Complaint:  Visit Type: Patient returns to my office for continued preventative foot care services. Complaint: Patient states" my nails have grown long and thick and become painful to walk and wear shoes" Patient has been diagnosed with DM with ESRD. The patient presents for preventative foot care services. No changes to ROS.  Patient has not been seen for over 9 months.  Podiatric Exam: Vascular: dorsalis pedis and posterior tibial pulses are palpable bilateral. Capillary return is immediate. Temperature gradient is WNL. Skin turgor WNL  Sensorium: Diminished.  Semmes Weinstein monofilament test. Normal tactile sensation bilaterally. Nail Exam: Pt has thick disfigured discolored nails with subungual debris noted bilateral entire nail hallux through fifth toenails Ulcer Exam: There is no evidence of ulcer or pre-ulcerative changes or infection. Orthopedic Exam: Muscle tone and strength are WNL. No limitations in general ROM. No crepitus or effusions noted. Foot type and digits show no abnormalities. Bony prominences are unremarkable. Skin:  Porokeratosis sub 4  B/L. No infection or ulcers.  Keratosis  Lichenoides  Chronica  Diagnosis:  Onychomycosis, , Pain in right toe, pain in left toes  Porokeratosis  B/L.  Treatment & Plan Procedures and Treatment: Consent by patient was obtained for treatment procedures.   Debridement of mycotic and hypertrophic toenails, 1 through 5 bilateral and clearing of subungual debris. No ulceration, no infection noted. Debride porokeratosis  B/l Return Visit-Office Procedure: Patient instructed to return to the office for a follow up visit 10 weeks for continued evaluation and treatment.    Gardiner Barefoot DPM

## 2018-04-13 DIAGNOSIS — E1129 Type 2 diabetes mellitus with other diabetic kidney complication: Secondary | ICD-10-CM | POA: Diagnosis not present

## 2018-04-13 DIAGNOSIS — D509 Iron deficiency anemia, unspecified: Secondary | ICD-10-CM | POA: Diagnosis not present

## 2018-04-13 DIAGNOSIS — N2581 Secondary hyperparathyroidism of renal origin: Secondary | ICD-10-CM | POA: Diagnosis not present

## 2018-04-13 DIAGNOSIS — N186 End stage renal disease: Secondary | ICD-10-CM | POA: Diagnosis not present

## 2018-04-13 DIAGNOSIS — D689 Coagulation defect, unspecified: Secondary | ICD-10-CM | POA: Diagnosis not present

## 2018-04-13 DIAGNOSIS — Z4901 Encounter for fitting and adjustment of extracorporeal dialysis catheter: Secondary | ICD-10-CM | POA: Diagnosis not present

## 2018-04-13 DIAGNOSIS — D631 Anemia in chronic kidney disease: Secondary | ICD-10-CM | POA: Diagnosis not present

## 2018-04-15 DIAGNOSIS — N2581 Secondary hyperparathyroidism of renal origin: Secondary | ICD-10-CM | POA: Diagnosis not present

## 2018-04-15 DIAGNOSIS — Z4901 Encounter for fitting and adjustment of extracorporeal dialysis catheter: Secondary | ICD-10-CM | POA: Diagnosis not present

## 2018-04-15 DIAGNOSIS — N186 End stage renal disease: Secondary | ICD-10-CM | POA: Diagnosis not present

## 2018-04-15 DIAGNOSIS — D689 Coagulation defect, unspecified: Secondary | ICD-10-CM | POA: Diagnosis not present

## 2018-04-15 DIAGNOSIS — E1129 Type 2 diabetes mellitus with other diabetic kidney complication: Secondary | ICD-10-CM | POA: Diagnosis not present

## 2018-04-15 DIAGNOSIS — D509 Iron deficiency anemia, unspecified: Secondary | ICD-10-CM | POA: Diagnosis not present

## 2018-04-15 DIAGNOSIS — D631 Anemia in chronic kidney disease: Secondary | ICD-10-CM | POA: Diagnosis not present

## 2018-04-18 DIAGNOSIS — N186 End stage renal disease: Secondary | ICD-10-CM | POA: Diagnosis not present

## 2018-04-18 DIAGNOSIS — N2581 Secondary hyperparathyroidism of renal origin: Secondary | ICD-10-CM | POA: Diagnosis not present

## 2018-04-18 DIAGNOSIS — Z4901 Encounter for fitting and adjustment of extracorporeal dialysis catheter: Secondary | ICD-10-CM | POA: Diagnosis not present

## 2018-04-18 DIAGNOSIS — E1129 Type 2 diabetes mellitus with other diabetic kidney complication: Secondary | ICD-10-CM | POA: Diagnosis not present

## 2018-04-18 DIAGNOSIS — D631 Anemia in chronic kidney disease: Secondary | ICD-10-CM | POA: Diagnosis not present

## 2018-04-18 DIAGNOSIS — D509 Iron deficiency anemia, unspecified: Secondary | ICD-10-CM | POA: Diagnosis not present

## 2018-04-18 DIAGNOSIS — D689 Coagulation defect, unspecified: Secondary | ICD-10-CM | POA: Diagnosis not present

## 2018-04-20 DIAGNOSIS — D631 Anemia in chronic kidney disease: Secondary | ICD-10-CM | POA: Diagnosis not present

## 2018-04-20 DIAGNOSIS — D689 Coagulation defect, unspecified: Secondary | ICD-10-CM | POA: Diagnosis not present

## 2018-04-20 DIAGNOSIS — Z4901 Encounter for fitting and adjustment of extracorporeal dialysis catheter: Secondary | ICD-10-CM | POA: Diagnosis not present

## 2018-04-20 DIAGNOSIS — D509 Iron deficiency anemia, unspecified: Secondary | ICD-10-CM | POA: Diagnosis not present

## 2018-04-20 DIAGNOSIS — E1129 Type 2 diabetes mellitus with other diabetic kidney complication: Secondary | ICD-10-CM | POA: Diagnosis not present

## 2018-04-20 DIAGNOSIS — N186 End stage renal disease: Secondary | ICD-10-CM | POA: Diagnosis not present

## 2018-04-20 DIAGNOSIS — N2581 Secondary hyperparathyroidism of renal origin: Secondary | ICD-10-CM | POA: Diagnosis not present

## 2018-04-21 ENCOUNTER — Other Ambulatory Visit: Payer: Self-pay | Admitting: Internal Medicine

## 2018-04-22 DIAGNOSIS — E1129 Type 2 diabetes mellitus with other diabetic kidney complication: Secondary | ICD-10-CM | POA: Diagnosis not present

## 2018-04-22 DIAGNOSIS — N186 End stage renal disease: Secondary | ICD-10-CM | POA: Diagnosis not present

## 2018-04-22 DIAGNOSIS — D509 Iron deficiency anemia, unspecified: Secondary | ICD-10-CM | POA: Diagnosis not present

## 2018-04-22 DIAGNOSIS — D689 Coagulation defect, unspecified: Secondary | ICD-10-CM | POA: Diagnosis not present

## 2018-04-22 DIAGNOSIS — Z4901 Encounter for fitting and adjustment of extracorporeal dialysis catheter: Secondary | ICD-10-CM | POA: Diagnosis not present

## 2018-04-22 DIAGNOSIS — N2581 Secondary hyperparathyroidism of renal origin: Secondary | ICD-10-CM | POA: Diagnosis not present

## 2018-04-22 DIAGNOSIS — D631 Anemia in chronic kidney disease: Secondary | ICD-10-CM | POA: Diagnosis not present

## 2018-04-25 DIAGNOSIS — N186 End stage renal disease: Secondary | ICD-10-CM | POA: Diagnosis not present

## 2018-04-25 DIAGNOSIS — D631 Anemia in chronic kidney disease: Secondary | ICD-10-CM | POA: Diagnosis not present

## 2018-04-25 DIAGNOSIS — Z4901 Encounter for fitting and adjustment of extracorporeal dialysis catheter: Secondary | ICD-10-CM | POA: Diagnosis not present

## 2018-04-25 DIAGNOSIS — N2581 Secondary hyperparathyroidism of renal origin: Secondary | ICD-10-CM | POA: Diagnosis not present

## 2018-04-25 DIAGNOSIS — D509 Iron deficiency anemia, unspecified: Secondary | ICD-10-CM | POA: Diagnosis not present

## 2018-04-25 DIAGNOSIS — E1129 Type 2 diabetes mellitus with other diabetic kidney complication: Secondary | ICD-10-CM | POA: Diagnosis not present

## 2018-04-25 DIAGNOSIS — D689 Coagulation defect, unspecified: Secondary | ICD-10-CM | POA: Diagnosis not present

## 2018-04-27 DIAGNOSIS — N2581 Secondary hyperparathyroidism of renal origin: Secondary | ICD-10-CM | POA: Diagnosis not present

## 2018-04-27 DIAGNOSIS — E1129 Type 2 diabetes mellitus with other diabetic kidney complication: Secondary | ICD-10-CM | POA: Diagnosis not present

## 2018-04-27 DIAGNOSIS — D689 Coagulation defect, unspecified: Secondary | ICD-10-CM | POA: Diagnosis not present

## 2018-04-27 DIAGNOSIS — Z4901 Encounter for fitting and adjustment of extracorporeal dialysis catheter: Secondary | ICD-10-CM | POA: Diagnosis not present

## 2018-04-27 DIAGNOSIS — D509 Iron deficiency anemia, unspecified: Secondary | ICD-10-CM | POA: Diagnosis not present

## 2018-04-27 DIAGNOSIS — D631 Anemia in chronic kidney disease: Secondary | ICD-10-CM | POA: Diagnosis not present

## 2018-04-27 DIAGNOSIS — N186 End stage renal disease: Secondary | ICD-10-CM | POA: Diagnosis not present

## 2018-04-29 DIAGNOSIS — N2581 Secondary hyperparathyroidism of renal origin: Secondary | ICD-10-CM | POA: Diagnosis not present

## 2018-04-29 DIAGNOSIS — D689 Coagulation defect, unspecified: Secondary | ICD-10-CM | POA: Diagnosis not present

## 2018-04-29 DIAGNOSIS — N186 End stage renal disease: Secondary | ICD-10-CM | POA: Diagnosis not present

## 2018-04-29 DIAGNOSIS — E1129 Type 2 diabetes mellitus with other diabetic kidney complication: Secondary | ICD-10-CM | POA: Diagnosis not present

## 2018-04-29 DIAGNOSIS — D509 Iron deficiency anemia, unspecified: Secondary | ICD-10-CM | POA: Diagnosis not present

## 2018-04-29 DIAGNOSIS — D631 Anemia in chronic kidney disease: Secondary | ICD-10-CM | POA: Diagnosis not present

## 2018-04-29 DIAGNOSIS — Z4901 Encounter for fitting and adjustment of extracorporeal dialysis catheter: Secondary | ICD-10-CM | POA: Diagnosis not present

## 2018-05-02 DIAGNOSIS — D509 Iron deficiency anemia, unspecified: Secondary | ICD-10-CM | POA: Diagnosis not present

## 2018-05-02 DIAGNOSIS — Z4901 Encounter for fitting and adjustment of extracorporeal dialysis catheter: Secondary | ICD-10-CM | POA: Diagnosis not present

## 2018-05-02 DIAGNOSIS — N186 End stage renal disease: Secondary | ICD-10-CM | POA: Diagnosis not present

## 2018-05-02 DIAGNOSIS — E1129 Type 2 diabetes mellitus with other diabetic kidney complication: Secondary | ICD-10-CM | POA: Diagnosis not present

## 2018-05-02 DIAGNOSIS — D689 Coagulation defect, unspecified: Secondary | ICD-10-CM | POA: Diagnosis not present

## 2018-05-02 DIAGNOSIS — D631 Anemia in chronic kidney disease: Secondary | ICD-10-CM | POA: Diagnosis not present

## 2018-05-02 DIAGNOSIS — N2581 Secondary hyperparathyroidism of renal origin: Secondary | ICD-10-CM | POA: Diagnosis not present

## 2018-05-04 DIAGNOSIS — Z4901 Encounter for fitting and adjustment of extracorporeal dialysis catheter: Secondary | ICD-10-CM | POA: Diagnosis not present

## 2018-05-04 DIAGNOSIS — D689 Coagulation defect, unspecified: Secondary | ICD-10-CM | POA: Diagnosis not present

## 2018-05-04 DIAGNOSIS — N186 End stage renal disease: Secondary | ICD-10-CM | POA: Diagnosis not present

## 2018-05-04 DIAGNOSIS — D631 Anemia in chronic kidney disease: Secondary | ICD-10-CM | POA: Diagnosis not present

## 2018-05-04 DIAGNOSIS — E1129 Type 2 diabetes mellitus with other diabetic kidney complication: Secondary | ICD-10-CM | POA: Diagnosis not present

## 2018-05-04 DIAGNOSIS — N2581 Secondary hyperparathyroidism of renal origin: Secondary | ICD-10-CM | POA: Diagnosis not present

## 2018-05-04 DIAGNOSIS — D509 Iron deficiency anemia, unspecified: Secondary | ICD-10-CM | POA: Diagnosis not present

## 2018-05-05 DIAGNOSIS — Z452 Encounter for adjustment and management of vascular access device: Secondary | ICD-10-CM | POA: Diagnosis not present

## 2018-05-06 DIAGNOSIS — N186 End stage renal disease: Secondary | ICD-10-CM | POA: Diagnosis not present

## 2018-05-06 DIAGNOSIS — Z4901 Encounter for fitting and adjustment of extracorporeal dialysis catheter: Secondary | ICD-10-CM | POA: Diagnosis not present

## 2018-05-06 DIAGNOSIS — N2581 Secondary hyperparathyroidism of renal origin: Secondary | ICD-10-CM | POA: Diagnosis not present

## 2018-05-06 DIAGNOSIS — D689 Coagulation defect, unspecified: Secondary | ICD-10-CM | POA: Diagnosis not present

## 2018-05-06 DIAGNOSIS — D631 Anemia in chronic kidney disease: Secondary | ICD-10-CM | POA: Diagnosis not present

## 2018-05-06 DIAGNOSIS — E1129 Type 2 diabetes mellitus with other diabetic kidney complication: Secondary | ICD-10-CM | POA: Diagnosis not present

## 2018-05-06 DIAGNOSIS — D509 Iron deficiency anemia, unspecified: Secondary | ICD-10-CM | POA: Diagnosis not present

## 2018-05-09 DIAGNOSIS — N186 End stage renal disease: Secondary | ICD-10-CM | POA: Diagnosis not present

## 2018-05-09 DIAGNOSIS — Z4901 Encounter for fitting and adjustment of extracorporeal dialysis catheter: Secondary | ICD-10-CM | POA: Diagnosis not present

## 2018-05-09 DIAGNOSIS — N2581 Secondary hyperparathyroidism of renal origin: Secondary | ICD-10-CM | POA: Diagnosis not present

## 2018-05-09 DIAGNOSIS — D509 Iron deficiency anemia, unspecified: Secondary | ICD-10-CM | POA: Diagnosis not present

## 2018-05-09 DIAGNOSIS — E1129 Type 2 diabetes mellitus with other diabetic kidney complication: Secondary | ICD-10-CM | POA: Diagnosis not present

## 2018-05-09 DIAGNOSIS — D689 Coagulation defect, unspecified: Secondary | ICD-10-CM | POA: Diagnosis not present

## 2018-05-09 DIAGNOSIS — D631 Anemia in chronic kidney disease: Secondary | ICD-10-CM | POA: Diagnosis not present

## 2018-05-11 DIAGNOSIS — E1122 Type 2 diabetes mellitus with diabetic chronic kidney disease: Secondary | ICD-10-CM | POA: Diagnosis not present

## 2018-05-11 DIAGNOSIS — E1129 Type 2 diabetes mellitus with other diabetic kidney complication: Secondary | ICD-10-CM | POA: Diagnosis not present

## 2018-05-11 DIAGNOSIS — N2581 Secondary hyperparathyroidism of renal origin: Secondary | ICD-10-CM | POA: Diagnosis not present

## 2018-05-11 DIAGNOSIS — Z4901 Encounter for fitting and adjustment of extracorporeal dialysis catheter: Secondary | ICD-10-CM | POA: Diagnosis not present

## 2018-05-11 DIAGNOSIS — D509 Iron deficiency anemia, unspecified: Secondary | ICD-10-CM | POA: Diagnosis not present

## 2018-05-11 DIAGNOSIS — N186 End stage renal disease: Secondary | ICD-10-CM | POA: Diagnosis not present

## 2018-05-11 DIAGNOSIS — D631 Anemia in chronic kidney disease: Secondary | ICD-10-CM | POA: Diagnosis not present

## 2018-05-11 DIAGNOSIS — D689 Coagulation defect, unspecified: Secondary | ICD-10-CM | POA: Diagnosis not present

## 2018-05-11 DIAGNOSIS — Z992 Dependence on renal dialysis: Secondary | ICD-10-CM | POA: Diagnosis not present

## 2018-05-13 DIAGNOSIS — N186 End stage renal disease: Secondary | ICD-10-CM | POA: Diagnosis not present

## 2018-05-13 DIAGNOSIS — N2581 Secondary hyperparathyroidism of renal origin: Secondary | ICD-10-CM | POA: Diagnosis not present

## 2018-05-13 DIAGNOSIS — D631 Anemia in chronic kidney disease: Secondary | ICD-10-CM | POA: Diagnosis not present

## 2018-05-13 DIAGNOSIS — D689 Coagulation defect, unspecified: Secondary | ICD-10-CM | POA: Diagnosis not present

## 2018-05-16 DIAGNOSIS — D631 Anemia in chronic kidney disease: Secondary | ICD-10-CM | POA: Diagnosis not present

## 2018-05-16 DIAGNOSIS — N2581 Secondary hyperparathyroidism of renal origin: Secondary | ICD-10-CM | POA: Diagnosis not present

## 2018-05-16 DIAGNOSIS — N186 End stage renal disease: Secondary | ICD-10-CM | POA: Diagnosis not present

## 2018-05-16 DIAGNOSIS — D689 Coagulation defect, unspecified: Secondary | ICD-10-CM | POA: Diagnosis not present

## 2018-05-18 DIAGNOSIS — N186 End stage renal disease: Secondary | ICD-10-CM | POA: Diagnosis not present

## 2018-05-18 DIAGNOSIS — D689 Coagulation defect, unspecified: Secondary | ICD-10-CM | POA: Diagnosis not present

## 2018-05-18 DIAGNOSIS — N2581 Secondary hyperparathyroidism of renal origin: Secondary | ICD-10-CM | POA: Diagnosis not present

## 2018-05-18 DIAGNOSIS — D631 Anemia in chronic kidney disease: Secondary | ICD-10-CM | POA: Diagnosis not present

## 2018-05-20 DIAGNOSIS — N186 End stage renal disease: Secondary | ICD-10-CM | POA: Diagnosis not present

## 2018-05-20 DIAGNOSIS — D631 Anemia in chronic kidney disease: Secondary | ICD-10-CM | POA: Diagnosis not present

## 2018-05-20 DIAGNOSIS — N2581 Secondary hyperparathyroidism of renal origin: Secondary | ICD-10-CM | POA: Diagnosis not present

## 2018-05-20 DIAGNOSIS — D689 Coagulation defect, unspecified: Secondary | ICD-10-CM | POA: Diagnosis not present

## 2018-05-23 DIAGNOSIS — N186 End stage renal disease: Secondary | ICD-10-CM | POA: Diagnosis not present

## 2018-05-23 DIAGNOSIS — N2581 Secondary hyperparathyroidism of renal origin: Secondary | ICD-10-CM | POA: Diagnosis not present

## 2018-05-23 DIAGNOSIS — D689 Coagulation defect, unspecified: Secondary | ICD-10-CM | POA: Diagnosis not present

## 2018-05-23 DIAGNOSIS — D631 Anemia in chronic kidney disease: Secondary | ICD-10-CM | POA: Diagnosis not present

## 2018-05-25 DIAGNOSIS — T82898A Other specified complication of vascular prosthetic devices, implants and grafts, initial encounter: Secondary | ICD-10-CM | POA: Diagnosis not present

## 2018-05-25 DIAGNOSIS — I871 Compression of vein: Secondary | ICD-10-CM | POA: Diagnosis not present

## 2018-05-25 DIAGNOSIS — Z992 Dependence on renal dialysis: Secondary | ICD-10-CM | POA: Diagnosis not present

## 2018-05-25 DIAGNOSIS — N186 End stage renal disease: Secondary | ICD-10-CM | POA: Diagnosis not present

## 2018-05-26 DIAGNOSIS — D689 Coagulation defect, unspecified: Secondary | ICD-10-CM | POA: Diagnosis not present

## 2018-05-26 DIAGNOSIS — N186 End stage renal disease: Secondary | ICD-10-CM | POA: Diagnosis not present

## 2018-05-26 DIAGNOSIS — D631 Anemia in chronic kidney disease: Secondary | ICD-10-CM | POA: Diagnosis not present

## 2018-05-26 DIAGNOSIS — N2581 Secondary hyperparathyroidism of renal origin: Secondary | ICD-10-CM | POA: Diagnosis not present

## 2018-05-27 DIAGNOSIS — D631 Anemia in chronic kidney disease: Secondary | ICD-10-CM | POA: Diagnosis not present

## 2018-05-27 DIAGNOSIS — N2581 Secondary hyperparathyroidism of renal origin: Secondary | ICD-10-CM | POA: Diagnosis not present

## 2018-05-27 DIAGNOSIS — N186 End stage renal disease: Secondary | ICD-10-CM | POA: Diagnosis not present

## 2018-05-27 DIAGNOSIS — D689 Coagulation defect, unspecified: Secondary | ICD-10-CM | POA: Diagnosis not present

## 2018-05-31 DIAGNOSIS — D631 Anemia in chronic kidney disease: Secondary | ICD-10-CM | POA: Diagnosis not present

## 2018-05-31 DIAGNOSIS — I871 Compression of vein: Secondary | ICD-10-CM | POA: Diagnosis not present

## 2018-05-31 DIAGNOSIS — T82868A Thrombosis of vascular prosthetic devices, implants and grafts, initial encounter: Secondary | ICD-10-CM | POA: Diagnosis not present

## 2018-05-31 DIAGNOSIS — N186 End stage renal disease: Secondary | ICD-10-CM | POA: Diagnosis not present

## 2018-05-31 DIAGNOSIS — Z992 Dependence on renal dialysis: Secondary | ICD-10-CM | POA: Diagnosis not present

## 2018-05-31 DIAGNOSIS — D689 Coagulation defect, unspecified: Secondary | ICD-10-CM | POA: Diagnosis not present

## 2018-05-31 DIAGNOSIS — N2581 Secondary hyperparathyroidism of renal origin: Secondary | ICD-10-CM | POA: Diagnosis not present

## 2018-06-01 DIAGNOSIS — D631 Anemia in chronic kidney disease: Secondary | ICD-10-CM | POA: Diagnosis not present

## 2018-06-01 DIAGNOSIS — N186 End stage renal disease: Secondary | ICD-10-CM | POA: Diagnosis not present

## 2018-06-01 DIAGNOSIS — N2581 Secondary hyperparathyroidism of renal origin: Secondary | ICD-10-CM | POA: Diagnosis not present

## 2018-06-01 DIAGNOSIS — D689 Coagulation defect, unspecified: Secondary | ICD-10-CM | POA: Diagnosis not present

## 2018-06-05 DIAGNOSIS — D631 Anemia in chronic kidney disease: Secondary | ICD-10-CM | POA: Diagnosis not present

## 2018-06-05 DIAGNOSIS — D689 Coagulation defect, unspecified: Secondary | ICD-10-CM | POA: Diagnosis not present

## 2018-06-05 DIAGNOSIS — N2581 Secondary hyperparathyroidism of renal origin: Secondary | ICD-10-CM | POA: Diagnosis not present

## 2018-06-05 DIAGNOSIS — N186 End stage renal disease: Secondary | ICD-10-CM | POA: Diagnosis not present

## 2018-06-06 DIAGNOSIS — T82898A Other specified complication of vascular prosthetic devices, implants and grafts, initial encounter: Secondary | ICD-10-CM | POA: Diagnosis not present

## 2018-06-06 DIAGNOSIS — Z992 Dependence on renal dialysis: Secondary | ICD-10-CM | POA: Diagnosis not present

## 2018-06-06 DIAGNOSIS — N186 End stage renal disease: Secondary | ICD-10-CM | POA: Diagnosis not present

## 2018-06-07 DIAGNOSIS — D631 Anemia in chronic kidney disease: Secondary | ICD-10-CM | POA: Diagnosis not present

## 2018-06-07 DIAGNOSIS — N186 End stage renal disease: Secondary | ICD-10-CM | POA: Diagnosis not present

## 2018-06-07 DIAGNOSIS — N2581 Secondary hyperparathyroidism of renal origin: Secondary | ICD-10-CM | POA: Diagnosis not present

## 2018-06-07 DIAGNOSIS — D689 Coagulation defect, unspecified: Secondary | ICD-10-CM | POA: Diagnosis not present

## 2018-06-10 DIAGNOSIS — D689 Coagulation defect, unspecified: Secondary | ICD-10-CM | POA: Diagnosis not present

## 2018-06-10 DIAGNOSIS — D631 Anemia in chronic kidney disease: Secondary | ICD-10-CM | POA: Diagnosis not present

## 2018-06-10 DIAGNOSIS — N186 End stage renal disease: Secondary | ICD-10-CM | POA: Diagnosis not present

## 2018-06-10 DIAGNOSIS — E1122 Type 2 diabetes mellitus with diabetic chronic kidney disease: Secondary | ICD-10-CM | POA: Diagnosis not present

## 2018-06-10 DIAGNOSIS — Z992 Dependence on renal dialysis: Secondary | ICD-10-CM | POA: Diagnosis not present

## 2018-06-10 DIAGNOSIS — N2581 Secondary hyperparathyroidism of renal origin: Secondary | ICD-10-CM | POA: Diagnosis not present

## 2018-06-13 DIAGNOSIS — D631 Anemia in chronic kidney disease: Secondary | ICD-10-CM | POA: Diagnosis not present

## 2018-06-13 DIAGNOSIS — Z23 Encounter for immunization: Secondary | ICD-10-CM | POA: Diagnosis not present

## 2018-06-13 DIAGNOSIS — N186 End stage renal disease: Secondary | ICD-10-CM | POA: Diagnosis not present

## 2018-06-13 DIAGNOSIS — D689 Coagulation defect, unspecified: Secondary | ICD-10-CM | POA: Diagnosis not present

## 2018-06-13 DIAGNOSIS — D509 Iron deficiency anemia, unspecified: Secondary | ICD-10-CM | POA: Diagnosis not present

## 2018-06-13 DIAGNOSIS — N2581 Secondary hyperparathyroidism of renal origin: Secondary | ICD-10-CM | POA: Diagnosis not present

## 2018-06-15 DIAGNOSIS — Z23 Encounter for immunization: Secondary | ICD-10-CM | POA: Diagnosis not present

## 2018-06-15 DIAGNOSIS — D631 Anemia in chronic kidney disease: Secondary | ICD-10-CM | POA: Diagnosis not present

## 2018-06-15 DIAGNOSIS — D509 Iron deficiency anemia, unspecified: Secondary | ICD-10-CM | POA: Diagnosis not present

## 2018-06-15 DIAGNOSIS — N186 End stage renal disease: Secondary | ICD-10-CM | POA: Diagnosis not present

## 2018-06-15 DIAGNOSIS — N2581 Secondary hyperparathyroidism of renal origin: Secondary | ICD-10-CM | POA: Diagnosis not present

## 2018-06-15 DIAGNOSIS — D689 Coagulation defect, unspecified: Secondary | ICD-10-CM | POA: Diagnosis not present

## 2018-06-17 DIAGNOSIS — N2581 Secondary hyperparathyroidism of renal origin: Secondary | ICD-10-CM | POA: Diagnosis not present

## 2018-06-17 DIAGNOSIS — D509 Iron deficiency anemia, unspecified: Secondary | ICD-10-CM | POA: Diagnosis not present

## 2018-06-17 DIAGNOSIS — Z23 Encounter for immunization: Secondary | ICD-10-CM | POA: Diagnosis not present

## 2018-06-17 DIAGNOSIS — N186 End stage renal disease: Secondary | ICD-10-CM | POA: Diagnosis not present

## 2018-06-17 DIAGNOSIS — D631 Anemia in chronic kidney disease: Secondary | ICD-10-CM | POA: Diagnosis not present

## 2018-06-17 DIAGNOSIS — D689 Coagulation defect, unspecified: Secondary | ICD-10-CM | POA: Diagnosis not present

## 2018-06-20 DIAGNOSIS — Z23 Encounter for immunization: Secondary | ICD-10-CM | POA: Diagnosis not present

## 2018-06-20 DIAGNOSIS — D509 Iron deficiency anemia, unspecified: Secondary | ICD-10-CM | POA: Diagnosis not present

## 2018-06-20 DIAGNOSIS — D689 Coagulation defect, unspecified: Secondary | ICD-10-CM | POA: Diagnosis not present

## 2018-06-20 DIAGNOSIS — D631 Anemia in chronic kidney disease: Secondary | ICD-10-CM | POA: Diagnosis not present

## 2018-06-20 DIAGNOSIS — N186 End stage renal disease: Secondary | ICD-10-CM | POA: Diagnosis not present

## 2018-06-20 DIAGNOSIS — N2581 Secondary hyperparathyroidism of renal origin: Secondary | ICD-10-CM | POA: Diagnosis not present

## 2018-06-22 DIAGNOSIS — D509 Iron deficiency anemia, unspecified: Secondary | ICD-10-CM | POA: Diagnosis not present

## 2018-06-22 DIAGNOSIS — D631 Anemia in chronic kidney disease: Secondary | ICD-10-CM | POA: Diagnosis not present

## 2018-06-22 DIAGNOSIS — Z23 Encounter for immunization: Secondary | ICD-10-CM | POA: Diagnosis not present

## 2018-06-22 DIAGNOSIS — N186 End stage renal disease: Secondary | ICD-10-CM | POA: Diagnosis not present

## 2018-06-22 DIAGNOSIS — D689 Coagulation defect, unspecified: Secondary | ICD-10-CM | POA: Diagnosis not present

## 2018-06-22 DIAGNOSIS — N2581 Secondary hyperparathyroidism of renal origin: Secondary | ICD-10-CM | POA: Diagnosis not present

## 2018-06-24 DIAGNOSIS — D631 Anemia in chronic kidney disease: Secondary | ICD-10-CM | POA: Diagnosis not present

## 2018-06-24 DIAGNOSIS — D689 Coagulation defect, unspecified: Secondary | ICD-10-CM | POA: Diagnosis not present

## 2018-06-24 DIAGNOSIS — N2581 Secondary hyperparathyroidism of renal origin: Secondary | ICD-10-CM | POA: Diagnosis not present

## 2018-06-24 DIAGNOSIS — N186 End stage renal disease: Secondary | ICD-10-CM | POA: Diagnosis not present

## 2018-06-24 DIAGNOSIS — Z23 Encounter for immunization: Secondary | ICD-10-CM | POA: Diagnosis not present

## 2018-06-24 DIAGNOSIS — D509 Iron deficiency anemia, unspecified: Secondary | ICD-10-CM | POA: Diagnosis not present

## 2018-06-26 DIAGNOSIS — H5703 Miosis: Secondary | ICD-10-CM | POA: Diagnosis not present

## 2018-06-26 DIAGNOSIS — H34211 Partial retinal artery occlusion, right eye: Secondary | ICD-10-CM | POA: Diagnosis not present

## 2018-06-26 DIAGNOSIS — H2513 Age-related nuclear cataract, bilateral: Secondary | ICD-10-CM | POA: Diagnosis not present

## 2018-06-27 DIAGNOSIS — Z23 Encounter for immunization: Secondary | ICD-10-CM | POA: Diagnosis not present

## 2018-06-27 DIAGNOSIS — N186 End stage renal disease: Secondary | ICD-10-CM | POA: Diagnosis not present

## 2018-06-27 DIAGNOSIS — D509 Iron deficiency anemia, unspecified: Secondary | ICD-10-CM | POA: Diagnosis not present

## 2018-06-27 DIAGNOSIS — D689 Coagulation defect, unspecified: Secondary | ICD-10-CM | POA: Diagnosis not present

## 2018-06-27 DIAGNOSIS — D631 Anemia in chronic kidney disease: Secondary | ICD-10-CM | POA: Diagnosis not present

## 2018-06-27 DIAGNOSIS — N2581 Secondary hyperparathyroidism of renal origin: Secondary | ICD-10-CM | POA: Diagnosis not present

## 2018-06-28 ENCOUNTER — Telehealth: Payer: Self-pay | Admitting: Internal Medicine

## 2018-06-28 DIAGNOSIS — H34219 Partial retinal artery occlusion, unspecified eye: Secondary | ICD-10-CM

## 2018-06-28 NOTE — Telephone Encounter (Signed)
Message sent to Dr Jenny Reichmann from Dr Zenia Resides Opthamologist

## 2018-06-29 DIAGNOSIS — D689 Coagulation defect, unspecified: Secondary | ICD-10-CM | POA: Diagnosis not present

## 2018-06-29 DIAGNOSIS — Z23 Encounter for immunization: Secondary | ICD-10-CM | POA: Diagnosis not present

## 2018-06-29 DIAGNOSIS — N186 End stage renal disease: Secondary | ICD-10-CM | POA: Diagnosis not present

## 2018-06-29 DIAGNOSIS — D509 Iron deficiency anemia, unspecified: Secondary | ICD-10-CM | POA: Diagnosis not present

## 2018-06-29 DIAGNOSIS — N2581 Secondary hyperparathyroidism of renal origin: Secondary | ICD-10-CM | POA: Diagnosis not present

## 2018-06-29 DIAGNOSIS — D631 Anemia in chronic kidney disease: Secondary | ICD-10-CM | POA: Diagnosis not present

## 2018-06-29 NOTE — Addendum Note (Signed)
Addended by: Biagio Borg on: 06/29/2018 12:48 PM   Modules accepted: Orders

## 2018-06-29 NOTE — Telephone Encounter (Signed)
Ok these orders are done

## 2018-07-01 DIAGNOSIS — D689 Coagulation defect, unspecified: Secondary | ICD-10-CM | POA: Diagnosis not present

## 2018-07-01 DIAGNOSIS — Z23 Encounter for immunization: Secondary | ICD-10-CM | POA: Diagnosis not present

## 2018-07-01 DIAGNOSIS — N186 End stage renal disease: Secondary | ICD-10-CM | POA: Diagnosis not present

## 2018-07-01 DIAGNOSIS — D509 Iron deficiency anemia, unspecified: Secondary | ICD-10-CM | POA: Diagnosis not present

## 2018-07-01 DIAGNOSIS — D631 Anemia in chronic kidney disease: Secondary | ICD-10-CM | POA: Diagnosis not present

## 2018-07-01 DIAGNOSIS — N2581 Secondary hyperparathyroidism of renal origin: Secondary | ICD-10-CM | POA: Diagnosis not present

## 2018-07-03 DIAGNOSIS — D631 Anemia in chronic kidney disease: Secondary | ICD-10-CM | POA: Diagnosis not present

## 2018-07-03 DIAGNOSIS — D689 Coagulation defect, unspecified: Secondary | ICD-10-CM | POA: Diagnosis not present

## 2018-07-03 DIAGNOSIS — N186 End stage renal disease: Secondary | ICD-10-CM | POA: Diagnosis not present

## 2018-07-03 DIAGNOSIS — N2581 Secondary hyperparathyroidism of renal origin: Secondary | ICD-10-CM | POA: Diagnosis not present

## 2018-07-03 DIAGNOSIS — D509 Iron deficiency anemia, unspecified: Secondary | ICD-10-CM | POA: Diagnosis not present

## 2018-07-03 DIAGNOSIS — Z23 Encounter for immunization: Secondary | ICD-10-CM | POA: Diagnosis not present

## 2018-07-06 DIAGNOSIS — Z23 Encounter for immunization: Secondary | ICD-10-CM | POA: Diagnosis not present

## 2018-07-06 DIAGNOSIS — D509 Iron deficiency anemia, unspecified: Secondary | ICD-10-CM | POA: Diagnosis not present

## 2018-07-06 DIAGNOSIS — N186 End stage renal disease: Secondary | ICD-10-CM | POA: Diagnosis not present

## 2018-07-06 DIAGNOSIS — D689 Coagulation defect, unspecified: Secondary | ICD-10-CM | POA: Diagnosis not present

## 2018-07-06 DIAGNOSIS — D631 Anemia in chronic kidney disease: Secondary | ICD-10-CM | POA: Diagnosis not present

## 2018-07-06 DIAGNOSIS — N2581 Secondary hyperparathyroidism of renal origin: Secondary | ICD-10-CM | POA: Diagnosis not present

## 2018-07-07 ENCOUNTER — Ambulatory Visit (INDEPENDENT_AMBULATORY_CARE_PROVIDER_SITE_OTHER): Payer: Medicare Other | Admitting: Physician Assistant

## 2018-07-07 ENCOUNTER — Other Ambulatory Visit: Payer: Self-pay | Admitting: *Deleted

## 2018-07-07 ENCOUNTER — Encounter: Payer: Self-pay | Admitting: *Deleted

## 2018-07-07 VITALS — BP 132/77 | HR 99 | Temp 98.1°F | Resp 18 | Ht 72.0 in | Wt 170.0 lb

## 2018-07-07 DIAGNOSIS — N186 End stage renal disease: Secondary | ICD-10-CM | POA: Diagnosis not present

## 2018-07-07 DIAGNOSIS — Z992 Dependence on renal dialysis: Secondary | ICD-10-CM

## 2018-07-07 NOTE — Progress Notes (Signed)
Established Dialysis Access   History of Present Illness   Christopher Lopez is a 57 y.o. ([birth year]) male who presents for re-evaluation for permanent access.  The patient is right hand dominant.  Previous access procedures have been completed in the left arm.  The patient's left second stage basilic vein transposition failed espite several fistulogram attempts by Dr. Augustin Coupe  The patient has not had a previous PPM placed.  Vein mapping 12/2017 did not show any suitable conduit for fistula use.  He is dialyzing from R IJ TDC on TTS schedule without complication.  He does not take blood thinners.  The patient's PMH, PSH, SH, and FamHx were reviewed and are unchanged from prior visit.  Current Outpatient Medications  Medication Sig Dispense Refill  . acetaminophen (TYLENOL) 650 MG CR tablet Take 650 mg by mouth daily.    Marland Kitchen aspirin EC 81 MG tablet Take 1 tablet (81 mg total) by mouth daily. 90 tablet 11  . diltiazem (TIAZAC) 360 MG 24 hr capsule Take 360 mg by mouth daily.  5  . Dulaglutide (TRULICITY) 1.5 AL/9.3XT SOPN Inject 0.5 mLs into the skin once a week. (Patient taking differently: Inject 1.5 mg into the skin every Monday. ) 6 mL 3  . glucose blood (ONE TOUCH ULTRA TEST) test strip Use as instructed once daily E11.9 100 each 12  . hydrALAZINE (APRESOLINE) 100 MG tablet Take 100 mg by mouth 3 (three) times daily.  3  . Lancets (ONETOUCH ULTRASOFT) lancets 1 each by Other route 2 (two) times daily. Use to check blood sugars once a day Dx e11.9 100 each 3  . lovastatin (MEVACOR) 40 MG tablet TAKE 1 TABLET BY MOUTH  DAILY. 90 tablet 2  . metoprolol succinate (TOPROL-XL) 50 MG 24 hr tablet TAKE 1 TABLET BY MOUTH  DAILY WITH OR IMMEDIATLEY  FOLLOWING A MEAL 90 tablet 1  . Multiple Vitamin (MULTIVITAMIN) tablet Take 1 tablet by mouth daily.    . sitaGLIPtin (JANUVIA) 100 MG tablet Take 1 tablet (100 mg total) by mouth daily. 90 tablet 3  . vitamin B-12 (CYANOCOBALAMIN) 1000 MCG tablet Take 1,000  mcg by mouth daily.    Marland Kitchen diltiazem (CARDIZEM LA) 360 MG 24 hr tablet Take 360 mg by mouth daily.  6  . hydrochlorothiazide (HYDRODIURIL) 25 MG tablet Take 1 tablet (25 mg total) by mouth daily. (Patient not taking: Reported on 04/12/2018) 90 tablet 3  . ondansetron (ZOFRAN) 4 MG tablet Take 1 tablet (4 mg total) by mouth every 8 (eight) hours as needed for nausea or vomiting. (Patient not taking: Reported on 04/12/2018) 15 tablet 0  . oxyCODONE-acetaminophen (PERCOCET) 5-325 MG tablet Take 1 tablet by mouth every 6 (six) hours as needed for severe pain. (Patient not taking: Reported on 04/12/2018) 12 tablet 0   No current facility-administered medications for this visit.     On ROS today: 10 system ROS is negative unless otherwise noted in HPI   Physical Examination   Vitals:   07/07/18 1258  BP: 132/77  Pulse: 99  Resp: 18  Temp: 98.1 F (36.7 C)  TempSrc: Oral  SpO2: 100%  Weight: 170 lb (77.1 kg)  Height: 6' (1.829 m)   Body mass index is 23.06 kg/m.  General Alert, O x 3, WD, NAD  Pulmonary Sym exp, good B air movt, CTA B  Cardiac RRR, Nl S1, S2,   Vascular Vessel Right Left  Radial Palpable Palpable  Brachial Palpable Palpable  Ulnar Not palpable  Not palpable    Musculo- skeletal Occluded L basilic vein fistula  Neurologic A&O; CN grossly intact    Medical Decision Making   Christopher Lopez is a 57 y.o. male who presents with ESRD requiring hemodialysis.    L basilic vein fistula failed several revascularization attempts at [facility] and is now occluded  Plan will be for L arm AV graft placement in the next few weeks on a non dialysis day Risk, benefits, and alternatives to access surgery were discussed.   The patient is aware the risks include but are not limited to: bleeding, infection, steal syndrome, nerve damage, thrombosis, and need for additional procedures.   The patient agrees to proceed forward with the procedure.  Dagoberto Ligas PA-C [facility] [place] Office: [phone]

## 2018-07-08 DIAGNOSIS — D509 Iron deficiency anemia, unspecified: Secondary | ICD-10-CM | POA: Diagnosis not present

## 2018-07-08 DIAGNOSIS — N186 End stage renal disease: Secondary | ICD-10-CM | POA: Diagnosis not present

## 2018-07-08 DIAGNOSIS — Z23 Encounter for immunization: Secondary | ICD-10-CM | POA: Diagnosis not present

## 2018-07-08 DIAGNOSIS — D689 Coagulation defect, unspecified: Secondary | ICD-10-CM | POA: Diagnosis not present

## 2018-07-08 DIAGNOSIS — D631 Anemia in chronic kidney disease: Secondary | ICD-10-CM | POA: Diagnosis not present

## 2018-07-08 DIAGNOSIS — N2581 Secondary hyperparathyroidism of renal origin: Secondary | ICD-10-CM | POA: Diagnosis not present

## 2018-07-10 ENCOUNTER — Other Ambulatory Visit (HOSPITAL_COMMUNITY): Payer: Medicare Other

## 2018-07-10 ENCOUNTER — Encounter (HOSPITAL_COMMUNITY): Payer: Medicare Other

## 2018-07-10 DIAGNOSIS — Z23 Encounter for immunization: Secondary | ICD-10-CM | POA: Diagnosis not present

## 2018-07-10 DIAGNOSIS — N186 End stage renal disease: Secondary | ICD-10-CM | POA: Diagnosis not present

## 2018-07-10 DIAGNOSIS — D509 Iron deficiency anemia, unspecified: Secondary | ICD-10-CM | POA: Diagnosis not present

## 2018-07-10 DIAGNOSIS — D631 Anemia in chronic kidney disease: Secondary | ICD-10-CM | POA: Diagnosis not present

## 2018-07-10 DIAGNOSIS — N2581 Secondary hyperparathyroidism of renal origin: Secondary | ICD-10-CM | POA: Diagnosis not present

## 2018-07-10 DIAGNOSIS — D689 Coagulation defect, unspecified: Secondary | ICD-10-CM | POA: Diagnosis not present

## 2018-07-11 DIAGNOSIS — E1122 Type 2 diabetes mellitus with diabetic chronic kidney disease: Secondary | ICD-10-CM | POA: Diagnosis not present

## 2018-07-11 DIAGNOSIS — N186 End stage renal disease: Secondary | ICD-10-CM | POA: Diagnosis not present

## 2018-07-11 DIAGNOSIS — Z992 Dependence on renal dialysis: Secondary | ICD-10-CM | POA: Diagnosis not present

## 2018-07-13 DIAGNOSIS — E1129 Type 2 diabetes mellitus with other diabetic kidney complication: Secondary | ICD-10-CM | POA: Diagnosis not present

## 2018-07-13 DIAGNOSIS — D509 Iron deficiency anemia, unspecified: Secondary | ICD-10-CM | POA: Diagnosis not present

## 2018-07-13 DIAGNOSIS — D631 Anemia in chronic kidney disease: Secondary | ICD-10-CM | POA: Diagnosis not present

## 2018-07-13 DIAGNOSIS — N186 End stage renal disease: Secondary | ICD-10-CM | POA: Diagnosis not present

## 2018-07-13 DIAGNOSIS — Z4901 Encounter for fitting and adjustment of extracorporeal dialysis catheter: Secondary | ICD-10-CM | POA: Diagnosis not present

## 2018-07-13 DIAGNOSIS — D689 Coagulation defect, unspecified: Secondary | ICD-10-CM | POA: Diagnosis not present

## 2018-07-13 DIAGNOSIS — N2581 Secondary hyperparathyroidism of renal origin: Secondary | ICD-10-CM | POA: Diagnosis not present

## 2018-07-15 DIAGNOSIS — D689 Coagulation defect, unspecified: Secondary | ICD-10-CM | POA: Diagnosis not present

## 2018-07-15 DIAGNOSIS — N2581 Secondary hyperparathyroidism of renal origin: Secondary | ICD-10-CM | POA: Diagnosis not present

## 2018-07-15 DIAGNOSIS — D509 Iron deficiency anemia, unspecified: Secondary | ICD-10-CM | POA: Diagnosis not present

## 2018-07-15 DIAGNOSIS — E1129 Type 2 diabetes mellitus with other diabetic kidney complication: Secondary | ICD-10-CM | POA: Diagnosis not present

## 2018-07-15 DIAGNOSIS — Z4901 Encounter for fitting and adjustment of extracorporeal dialysis catheter: Secondary | ICD-10-CM | POA: Diagnosis not present

## 2018-07-15 DIAGNOSIS — N186 End stage renal disease: Secondary | ICD-10-CM | POA: Diagnosis not present

## 2018-07-15 DIAGNOSIS — D631 Anemia in chronic kidney disease: Secondary | ICD-10-CM | POA: Diagnosis not present

## 2018-07-17 ENCOUNTER — Encounter: Payer: Self-pay | Admitting: *Deleted

## 2018-07-17 ENCOUNTER — Other Ambulatory Visit: Payer: Self-pay

## 2018-07-17 ENCOUNTER — Ambulatory Visit (HOSPITAL_COMMUNITY)
Admission: RE | Admit: 2018-07-17 | Discharge: 2018-07-17 | Disposition: A | Discharge status: Home / Self Care | Payer: Medicare Other | Source: Ambulatory Visit | Attending: Cardiovascular Disease | Admitting: Cardiovascular Disease

## 2018-07-17 ENCOUNTER — Ambulatory Visit (HOSPITAL_BASED_OUTPATIENT_CLINIC_OR_DEPARTMENT_OTHER): Payer: Medicare Other

## 2018-07-17 DIAGNOSIS — H34219 Partial retinal artery occlusion, unspecified eye: Secondary | ICD-10-CM | POA: Insufficient documentation

## 2018-07-17 MED ORDER — PERFLUTREN LIPID MICROSPHERE
1.0000 mL | INTRAVENOUS | Status: AC | PRN
  Administered 2018-07-17: 2 mL via INTRAVENOUS

## 2018-07-18 ENCOUNTER — Other Ambulatory Visit: Payer: Self-pay | Admitting: Internal Medicine

## 2018-07-18 ENCOUNTER — Telehealth: Payer: Self-pay

## 2018-07-18 DIAGNOSIS — N2581 Secondary hyperparathyroidism of renal origin: Secondary | ICD-10-CM | POA: Diagnosis not present

## 2018-07-18 DIAGNOSIS — D631 Anemia in chronic kidney disease: Secondary | ICD-10-CM | POA: Diagnosis not present

## 2018-07-18 DIAGNOSIS — Z4901 Encounter for fitting and adjustment of extracorporeal dialysis catheter: Secondary | ICD-10-CM | POA: Diagnosis not present

## 2018-07-18 DIAGNOSIS — N186 End stage renal disease: Secondary | ICD-10-CM | POA: Diagnosis not present

## 2018-07-18 DIAGNOSIS — E1129 Type 2 diabetes mellitus with other diabetic kidney complication: Secondary | ICD-10-CM | POA: Diagnosis not present

## 2018-07-18 DIAGNOSIS — D689 Coagulation defect, unspecified: Secondary | ICD-10-CM | POA: Diagnosis not present

## 2018-07-18 DIAGNOSIS — D509 Iron deficiency anemia, unspecified: Secondary | ICD-10-CM | POA: Diagnosis not present

## 2018-07-18 DIAGNOSIS — I429 Cardiomyopathy, unspecified: Secondary | ICD-10-CM

## 2018-07-18 NOTE — Telephone Encounter (Signed)
-----   Message from Biagio Borg, MD sent at 07/18/2018 12:13 PM EST ----- Left message on MyChart, pt to cont same tx except  The test results show that your current treatment is OK, except the heart tests shows mild to moderate reduced function of the heart.  The reason for this is not clear.  We should refer you to Cardiology for further evaluation.  You should hopefully hear soon.   Christopher Lopez to please inform pt, I will do referral for cardiomyopathy

## 2018-07-18 NOTE — Telephone Encounter (Signed)
Pt's mother has been informed and expressed understanding.

## 2018-07-19 ENCOUNTER — Encounter: Payer: Self-pay | Admitting: Podiatry

## 2018-07-19 ENCOUNTER — Ambulatory Visit: Payer: Medicare Other | Admitting: Podiatry

## 2018-07-19 DIAGNOSIS — Q828 Other specified congenital malformations of skin: Secondary | ICD-10-CM | POA: Diagnosis not present

## 2018-07-19 DIAGNOSIS — E1142 Type 2 diabetes mellitus with diabetic polyneuropathy: Secondary | ICD-10-CM | POA: Diagnosis not present

## 2018-07-19 DIAGNOSIS — B351 Tinea unguium: Secondary | ICD-10-CM | POA: Diagnosis not present

## 2018-07-19 DIAGNOSIS — M79676 Pain in unspecified toe(s): Secondary | ICD-10-CM

## 2018-07-19 DIAGNOSIS — L280 Lichen simplex chronicus: Secondary | ICD-10-CM

## 2018-07-19 NOTE — Progress Notes (Signed)
Complaint:  Visit Type: Patient returns to my office for continued preventative foot care services. Complaint: Patient states" my nails have grown long and thick and become painful to walk and wear shoes" Patient has been diagnosed with DM with ESRD. The patient presents for preventative foot care services. No changes to ROS.  Patient has not been seen for over 9 months.  Podiatric Exam: Vascular: dorsalis pedis and posterior tibial pulses are palpable bilateral. Capillary return is immediate. Temperature gradient is WNL. Skin turgor WNL  Sensorium: Diminished.  Semmes Weinstein monofilament test. Normal tactile sensation bilaterally. Nail Exam: Pt has thick disfigured discolored nails with subungual debris noted bilateral entire nail hallux through fifth toenails Ulcer Exam: There is no evidence of ulcer or pre-ulcerative changes or infection. Orthopedic Exam: Muscle tone and strength are WNL. No limitations in general ROM. No crepitus or effusions noted. Foot type and digits show no abnormalities. Bony prominences are unremarkable. Skin:  Porokeratosis sub 4  B/L. No infection or ulcers.  Keratosis  Lichenoides  Chronica  Diagnosis:  Onychomycosis, , Pain in right toe, pain in left toes  Porokeratosis  B/L.  Treatment & Plan Procedures and Treatment: Consent by patient was obtained for treatment procedures.   Debridement of mycotic and hypertrophic toenails, 1 through 5 bilateral and clearing of subungual debris. No ulceration, no infection noted. Debride porokeratosis  B/l. Cauterize porokeratosis left foot. Return Visit-Office Procedure: Patient instructed to return to the office for a follow up visit 10 weeks for continued evaluation and treatment.    Gardiner Barefoot DPM

## 2018-07-20 DIAGNOSIS — D631 Anemia in chronic kidney disease: Secondary | ICD-10-CM | POA: Diagnosis not present

## 2018-07-20 DIAGNOSIS — Z4901 Encounter for fitting and adjustment of extracorporeal dialysis catheter: Secondary | ICD-10-CM | POA: Diagnosis not present

## 2018-07-20 DIAGNOSIS — D509 Iron deficiency anemia, unspecified: Secondary | ICD-10-CM | POA: Diagnosis not present

## 2018-07-20 DIAGNOSIS — N2581 Secondary hyperparathyroidism of renal origin: Secondary | ICD-10-CM | POA: Diagnosis not present

## 2018-07-20 DIAGNOSIS — D689 Coagulation defect, unspecified: Secondary | ICD-10-CM | POA: Diagnosis not present

## 2018-07-20 DIAGNOSIS — E1129 Type 2 diabetes mellitus with other diabetic kidney complication: Secondary | ICD-10-CM | POA: Diagnosis not present

## 2018-07-20 DIAGNOSIS — N186 End stage renal disease: Secondary | ICD-10-CM | POA: Diagnosis not present

## 2018-07-22 DIAGNOSIS — D509 Iron deficiency anemia, unspecified: Secondary | ICD-10-CM | POA: Diagnosis not present

## 2018-07-22 DIAGNOSIS — Z4901 Encounter for fitting and adjustment of extracorporeal dialysis catheter: Secondary | ICD-10-CM | POA: Diagnosis not present

## 2018-07-22 DIAGNOSIS — D689 Coagulation defect, unspecified: Secondary | ICD-10-CM | POA: Diagnosis not present

## 2018-07-22 DIAGNOSIS — N186 End stage renal disease: Secondary | ICD-10-CM | POA: Diagnosis not present

## 2018-07-22 DIAGNOSIS — D631 Anemia in chronic kidney disease: Secondary | ICD-10-CM | POA: Diagnosis not present

## 2018-07-22 DIAGNOSIS — E1129 Type 2 diabetes mellitus with other diabetic kidney complication: Secondary | ICD-10-CM | POA: Diagnosis not present

## 2018-07-22 DIAGNOSIS — N2581 Secondary hyperparathyroidism of renal origin: Secondary | ICD-10-CM | POA: Diagnosis not present

## 2018-07-24 DIAGNOSIS — T8249XA Other complication of vascular dialysis catheter, initial encounter: Secondary | ICD-10-CM | POA: Diagnosis not present

## 2018-07-24 DIAGNOSIS — Z992 Dependence on renal dialysis: Secondary | ICD-10-CM | POA: Diagnosis not present

## 2018-07-24 DIAGNOSIS — N186 End stage renal disease: Secondary | ICD-10-CM | POA: Diagnosis not present

## 2018-07-25 ENCOUNTER — Encounter (HOSPITAL_COMMUNITY): Payer: Self-pay | Admitting: *Deleted

## 2018-07-25 ENCOUNTER — Other Ambulatory Visit: Payer: Self-pay

## 2018-07-25 DIAGNOSIS — Z4901 Encounter for fitting and adjustment of extracorporeal dialysis catheter: Secondary | ICD-10-CM | POA: Diagnosis not present

## 2018-07-25 DIAGNOSIS — D689 Coagulation defect, unspecified: Secondary | ICD-10-CM | POA: Diagnosis not present

## 2018-07-25 DIAGNOSIS — N186 End stage renal disease: Secondary | ICD-10-CM | POA: Diagnosis not present

## 2018-07-25 DIAGNOSIS — E1129 Type 2 diabetes mellitus with other diabetic kidney complication: Secondary | ICD-10-CM | POA: Diagnosis not present

## 2018-07-25 DIAGNOSIS — D631 Anemia in chronic kidney disease: Secondary | ICD-10-CM | POA: Diagnosis not present

## 2018-07-25 DIAGNOSIS — D509 Iron deficiency anemia, unspecified: Secondary | ICD-10-CM | POA: Diagnosis not present

## 2018-07-25 DIAGNOSIS — N2581 Secondary hyperparathyroidism of renal origin: Secondary | ICD-10-CM | POA: Diagnosis not present

## 2018-07-25 NOTE — Progress Notes (Signed)
Pt SDW-Pre-op call completed by pt mother, Enid Derry (DPR). Mother denies that pt C/O SOB and chest pain. Mother stated that pt will be referred to a cardiologist but has not been assigned one yet (see echo). Pt mother made aware to have pt stop taking vitamins, fish oil and herbal medications. Do not take any NSAIDs ie: Ibuprofen, Advil, Naproxen (Aleve), Motrin, BC and Goody Powder. Mother made aware to have pt hold Januvia on morning of procedure. Mother stated that pt does not have a glucometer to check his blood glucose.. Mother verbalized understanding of all pre-op instructions.

## 2018-07-26 ENCOUNTER — Ambulatory Visit (HOSPITAL_COMMUNITY): Payer: Medicare Other | Admitting: Physician Assistant

## 2018-07-26 ENCOUNTER — Ambulatory Visit (HOSPITAL_COMMUNITY)
Admission: RE | Admit: 2018-07-26 | Discharge: 2018-07-26 | Disposition: A | Discharge status: Home / Self Care | Payer: Medicare Other | Attending: Vascular Surgery | Admitting: Vascular Surgery

## 2018-07-26 ENCOUNTER — Encounter (HOSPITAL_COMMUNITY)
Admission: RE | Disposition: A | Discharge status: Home / Self Care | Payer: Self-pay | Source: Home / Self Care | Attending: Vascular Surgery

## 2018-07-26 ENCOUNTER — Ambulatory Visit (HOSPITAL_COMMUNITY): Payer: Medicare Other

## 2018-07-26 ENCOUNTER — Encounter (HOSPITAL_COMMUNITY): Payer: Self-pay | Admitting: *Deleted

## 2018-07-26 DIAGNOSIS — Z7982 Long term (current) use of aspirin: Secondary | ICD-10-CM | POA: Insufficient documentation

## 2018-07-26 DIAGNOSIS — E7800 Pure hypercholesterolemia, unspecified: Secondary | ICD-10-CM | POA: Diagnosis not present

## 2018-07-26 DIAGNOSIS — Z992 Dependence on renal dialysis: Secondary | ICD-10-CM | POA: Diagnosis not present

## 2018-07-26 DIAGNOSIS — D631 Anemia in chronic kidney disease: Secondary | ICD-10-CM | POA: Diagnosis not present

## 2018-07-26 DIAGNOSIS — E1122 Type 2 diabetes mellitus with diabetic chronic kidney disease: Secondary | ICD-10-CM | POA: Diagnosis not present

## 2018-07-26 DIAGNOSIS — H9193 Unspecified hearing loss, bilateral: Secondary | ICD-10-CM | POA: Insufficient documentation

## 2018-07-26 DIAGNOSIS — I82A12 Acute embolism and thrombosis of left axillary vein: Secondary | ICD-10-CM | POA: Insufficient documentation

## 2018-07-26 DIAGNOSIS — I120 Hypertensive chronic kidney disease with stage 5 chronic kidney disease or end stage renal disease: Secondary | ICD-10-CM | POA: Diagnosis not present

## 2018-07-26 DIAGNOSIS — T82898A Other specified complication of vascular prosthetic devices, implants and grafts, initial encounter: Secondary | ICD-10-CM | POA: Diagnosis not present

## 2018-07-26 DIAGNOSIS — M1990 Unspecified osteoarthritis, unspecified site: Secondary | ICD-10-CM | POA: Diagnosis not present

## 2018-07-26 DIAGNOSIS — Z8673 Personal history of transient ischemic attack (TIA), and cerebral infarction without residual deficits: Secondary | ICD-10-CM | POA: Diagnosis not present

## 2018-07-26 DIAGNOSIS — M21372 Foot drop, left foot: Secondary | ICD-10-CM | POA: Insufficient documentation

## 2018-07-26 DIAGNOSIS — N186 End stage renal disease: Secondary | ICD-10-CM | POA: Insufficient documentation

## 2018-07-26 DIAGNOSIS — F17200 Nicotine dependence, unspecified, uncomplicated: Secondary | ICD-10-CM | POA: Insufficient documentation

## 2018-07-26 DIAGNOSIS — I770 Arteriovenous fistula, acquired: Secondary | ICD-10-CM | POA: Diagnosis not present

## 2018-07-26 DIAGNOSIS — Z79899 Other long term (current) drug therapy: Secondary | ICD-10-CM | POA: Diagnosis not present

## 2018-07-26 DIAGNOSIS — N185 Chronic kidney disease, stage 5: Secondary | ICD-10-CM | POA: Diagnosis not present

## 2018-07-26 HISTORY — DX: Presence of spectacles and contact lenses: Z97.3

## 2018-07-26 HISTORY — PX: VEIN REPAIR: SHX6524

## 2018-07-26 HISTORY — PX: THROMBECTOMY BRACHIAL ARTERY: SHX6649

## 2018-07-26 HISTORY — PX: AV FISTULA PLACEMENT: SHX1204

## 2018-07-26 LAB — POCT I-STAT 4, (NA,K, GLUC, HGB,HCT)
Glucose, Bld: 124 mg/dL — ABNORMAL HIGH (ref 70–99)
HCT: 36 % — ABNORMAL LOW (ref 39.0–52.0)
Hemoglobin: 12.2 g/dL — ABNORMAL LOW (ref 13.0–17.0)
Potassium: 4.4 mmol/L (ref 3.5–5.1)
SODIUM: 135 mmol/L (ref 135–145)

## 2018-07-26 LAB — GLUCOSE, CAPILLARY
GLUCOSE-CAPILLARY: 137 mg/dL — AB (ref 70–99)
Glucose-Capillary: 89 mg/dL (ref 70–99)
Glucose-Capillary: 129 mg/dL — ABNORMAL HIGH (ref 70–99)

## 2018-07-26 SURGERY — INSERTION OF ARTERIOVENOUS (AV) GORE-TEX GRAFT ARM
Anesthesia: General | Site: Arm Upper | Laterality: Left

## 2018-07-26 MED ORDER — DEXAMETHASONE SODIUM PHOSPHATE 10 MG/ML IJ SOLN
INTRAMUSCULAR | Status: AC
  Filled 2018-07-26: qty 1

## 2018-07-26 MED ORDER — PHENYLEPHRINE 40 MCG/ML (10ML) SYRINGE FOR IV PUSH (FOR BLOOD PRESSURE SUPPORT)
PREFILLED_SYRINGE | INTRAVENOUS | Status: DC | PRN
  Administered 2018-07-26 (×6): 80 ug via INTRAVENOUS

## 2018-07-26 MED ORDER — 0.9 % SODIUM CHLORIDE (POUR BTL) OPTIME
TOPICAL | Status: DC | PRN
  Administered 2018-07-26: 1000 mL

## 2018-07-26 MED ORDER — LIDOCAINE HCL (CARDIAC) PF 100 MG/5ML IV SOSY
PREFILLED_SYRINGE | INTRAVENOUS | Status: DC | PRN
  Administered 2018-07-26: 100 mg via INTRAVENOUS

## 2018-07-26 MED ORDER — OXYCODONE-ACETAMINOPHEN 5-325 MG PO TABS: 1.0000 | ORAL_TABLET | Freq: Four times a day (QID) | ORAL | 0 refills | Status: DC | PRN

## 2018-07-26 MED ORDER — HEMOSTATIC AGENTS (NO CHARGE) OPTIME
TOPICAL | Status: DC | PRN
  Administered 2018-07-26: 1 via TOPICAL

## 2018-07-26 MED ORDER — MIDAZOLAM HCL 5 MG/5ML IJ SOLN
INTRAMUSCULAR | Status: DC | PRN
  Administered 2018-07-26: 1 mg via INTRAVENOUS

## 2018-07-26 MED ORDER — ONDANSETRON HCL 4 MG/2ML IJ SOLN
INTRAMUSCULAR | Status: AC
  Filled 2018-07-26: qty 2

## 2018-07-26 MED ORDER — LIDOCAINE HCL (PF) 1 % IJ SOLN
INTRAMUSCULAR | Status: AC
  Filled 2018-07-26: qty 30

## 2018-07-26 MED ORDER — IODIXANOL 320 MG/ML IV SOLN
INTRAVENOUS | Status: DC | PRN
  Administered 2018-07-26: 80 mL via INTRAVENOUS

## 2018-07-26 MED ORDER — DEXAMETHASONE SODIUM PHOSPHATE 10 MG/ML IJ SOLN
INTRAMUSCULAR | Status: DC | PRN
  Administered 2018-07-26: 10 mg via INTRAVENOUS

## 2018-07-26 MED ORDER — ONDANSETRON HCL 4 MG/2ML IJ SOLN
INTRAMUSCULAR | Status: DC | PRN
  Administered 2018-07-26: 4 mg via INTRAVENOUS

## 2018-07-26 MED ORDER — HEPARIN SODIUM (PORCINE) 1000 UNIT/ML IJ SOLN
INTRAMUSCULAR | Status: DC | PRN
  Administered 2018-07-26: 3000 [IU] via INTRAVENOUS

## 2018-07-26 MED ORDER — CEFAZOLIN SODIUM-DEXTROSE 2-4 GM/100ML-% IV SOLN
2.0000 g | INTRAVENOUS | Status: AC
  Administered 2018-07-26: 2 g via INTRAVENOUS
  Filled 2018-07-26: qty 100

## 2018-07-26 MED ORDER — PROPOFOL 10 MG/ML IV BOLUS
INTRAVENOUS | Status: DC | PRN
  Administered 2018-07-26: 180 mg via INTRAVENOUS

## 2018-07-26 MED ORDER — PHENYLEPHRINE 40 MCG/ML (10ML) SYRINGE FOR IV PUSH (FOR BLOOD PRESSURE SUPPORT)
PREFILLED_SYRINGE | INTRAVENOUS | Status: AC
  Filled 2018-07-26: qty 10

## 2018-07-26 MED ORDER — FENTANYL CITRATE (PF) 100 MCG/2ML IJ SOLN
INTRAMUSCULAR | Status: DC | PRN
  Administered 2018-07-26: 25 ug via INTRAVENOUS
  Administered 2018-07-26: 50 ug via INTRAVENOUS
  Administered 2018-07-26: 25 ug via INTRAVENOUS
  Administered 2018-07-26: 50 ug via INTRAVENOUS

## 2018-07-26 MED ORDER — FENTANYL CITRATE (PF) 250 MCG/5ML IJ SOLN
INTRAMUSCULAR | Status: AC
  Filled 2018-07-26: qty 5

## 2018-07-26 MED ORDER — SODIUM CHLORIDE 0.9 % IV SOLN
INTRAVENOUS | Status: DC | PRN
  Administered 2018-07-26: 500 mL

## 2018-07-26 MED ORDER — SODIUM CHLORIDE 0.9 % IV SOLN
INTRAVENOUS | Status: AC
  Filled 2018-07-26: qty 1.2

## 2018-07-26 MED ORDER — LIDOCAINE-EPINEPHRINE (PF) 1 %-1:200000 IJ SOLN
INTRAMUSCULAR | Status: AC
  Filled 2018-07-26: qty 30

## 2018-07-26 MED ORDER — PROPOFOL 10 MG/ML IV BOLUS
INTRAVENOUS | Status: AC
  Filled 2018-07-26: qty 20

## 2018-07-26 MED ORDER — SODIUM CHLORIDE 0.9 % IV SOLN
INTRAVENOUS | Status: DC
  Administered 2018-07-26 (×2): via INTRAVENOUS

## 2018-07-26 MED ORDER — ALBUMIN HUMAN 5 % IV SOLN
INTRAVENOUS | Status: DC | PRN
  Administered 2018-07-26: 13:00:00 via INTRAVENOUS

## 2018-07-26 MED ORDER — LIDOCAINE 2% (20 MG/ML) 5 ML SYRINGE
INTRAMUSCULAR | Status: AC
  Filled 2018-07-26: qty 5

## 2018-07-26 MED ORDER — MIDAZOLAM HCL 2 MG/2ML IJ SOLN
INTRAMUSCULAR | Status: AC
  Filled 2018-07-26: qty 2

## 2018-07-26 MED ORDER — HEPARIN SODIUM (PORCINE) 1000 UNIT/ML IJ SOLN
INTRAMUSCULAR | Status: AC
  Filled 2018-07-26: qty 1

## 2018-07-26 SURGICAL SUPPLY — 52 items
ADH SKN CLS APL DERMABOND .7 (GAUZE/BANDAGES/DRESSINGS) ×1
ARMBAND PINK RESTRICT EXTREMIT (MISCELLANEOUS) ×6 IMPLANT
BALLN MUSTANG 8X60X75 (BALLOONS) ×3
BALLOON MUSTANG 8X60X75 (BALLOONS) IMPLANT
CANISTER SUCT 3000ML PPV (MISCELLANEOUS) ×3 IMPLANT
CANNULA VESSEL 3MM 2 BLNT TIP (CANNULA) ×2 IMPLANT
CATH EMB 3FR 40CM (CATHETERS) ×4 IMPLANT
CATH EMB 4FR 40CM (CATHETERS) ×2 IMPLANT
CLIP VESOCCLUDE MED 6/CT (CLIP) ×5 IMPLANT
CLIP VESOCCLUDE SM WIDE 6/CT (CLIP) ×5 IMPLANT
COVER WAND RF STERILE (DRAPES) ×3 IMPLANT
DERMABOND ADVANCED (GAUZE/BANDAGES/DRESSINGS) ×2
DERMABOND ADVANCED .7 DNX12 (GAUZE/BANDAGES/DRESSINGS) ×1 IMPLANT
DRAPE C-ARM 42X72 X-RAY (DRAPES) ×2 IMPLANT
ELECT REM PT RETURN 9FT ADLT (ELECTROSURGICAL) ×3
ELECTRODE REM PT RTRN 9FT ADLT (ELECTROSURGICAL) ×1 IMPLANT
GLIDEWIRE ADV .035X260CM (WIRE) ×2 IMPLANT
GLOVE BIO SURGEON STRL SZ 6.5 (GLOVE) ×4 IMPLANT
GLOVE BIO SURGEON STRL SZ7.5 (GLOVE) ×5 IMPLANT
GLOVE BIO SURGEONS STRL SZ 6.5 (GLOVE) ×4
GLOVE BIOGEL PI IND STRL 6.5 (GLOVE) IMPLANT
GLOVE BIOGEL PI INDICATOR 6.5 (GLOVE) ×4
GOWN STRL REUS W/ TWL LRG LVL3 (GOWN DISPOSABLE) ×2 IMPLANT
GOWN STRL REUS W/ TWL XL LVL3 (GOWN DISPOSABLE) ×1 IMPLANT
GOWN STRL REUS W/TWL LRG LVL3 (GOWN DISPOSABLE) ×6
GOWN STRL REUS W/TWL XL LVL3 (GOWN DISPOSABLE) ×3
GRAFT GORETEX STRT 4-7X45 (Vascular Products) ×2 IMPLANT
HEMOSTAT SNOW SURGICEL 2X4 (HEMOSTASIS) ×2 IMPLANT
INSERT FOGARTY SM (MISCELLANEOUS) ×3 IMPLANT
KIT BASIN OR (CUSTOM PROCEDURE TRAY) ×3 IMPLANT
KIT ENCORE 26 ADVANTAGE (KITS) ×2 IMPLANT
KIT TURNOVER KIT B (KITS) ×3 IMPLANT
NS IRRIG 1000ML POUR BTL (IV SOLUTION) ×3 IMPLANT
PACK CV ACCESS (CUSTOM PROCEDURE TRAY) ×3 IMPLANT
PAD ARMBOARD 7.5X6 YLW CONV (MISCELLANEOUS) ×6 IMPLANT
SET MICROPUNCTURE 5F STIFF (MISCELLANEOUS) ×2 IMPLANT
SHEATH BRITE TIP 7FRX11 (SHEATH) ×2 IMPLANT
SHEATH PINNACLE 5F 10CM (SHEATH) ×2 IMPLANT
STENT VIABAHN 8X50X120 (Permanent Stent) ×3 IMPLANT
STENT VIABAHN5X120X8FR 8X (Permanent Stent) IMPLANT
SURGICEL SNOW 2X4 (HEMOSTASIS) ×1 IMPLANT
SUT MNCRL AB 4-0 PS2 18 (SUTURE) IMPLANT
SUT PROLENE 5 0 C 1 24 (SUTURE) ×4 IMPLANT
SUT PROLENE 5 0 C 1 36 (SUTURE) ×4 IMPLANT
SUT PROLENE 6 0 BV (SUTURE) IMPLANT
SUT SILK 2 0 SH (SUTURE) IMPLANT
SUT VIC AB 3-0 SH 27 (SUTURE) ×6
SUT VIC AB 3-0 SH 27X BRD (SUTURE) ×2 IMPLANT
SYR TOOMEY 50ML (SYRINGE) IMPLANT
TOWEL GREEN STERILE (TOWEL DISPOSABLE) ×3 IMPLANT
UNDERPAD 30X30 (UNDERPADS AND DIAPERS) ×3 IMPLANT
WATER STERILE IRR 1000ML POUR (IV SOLUTION) ×3 IMPLANT

## 2018-07-26 NOTE — Op Note (Addendum)
Patient name: Christopher Lopez MRN: [id number] DOB: [birth year] Sex: male  07/26/2018 Pre-operative Diagnosis: End-stage renal disease Post-operative diagnosis:  Same Surgeon:  Erlene Quan C. Donzetta Matters, MD Assistant: Leontine Locket, PA Procedure Performed: 1.  Stent removal and thrombectomy left axillary and subclavian veins 2.  Left brachial artery to axillary vein graft with 4-7 mm PTFE 3.  Left upper extremity and central venography 4.  Stent of left axillary vein with 8 x 50 mm viabahn  Indications: 58 year old male is undergone left upper extremity dialysis access creation with fistula.  This is failed and he is undergone endovascular intervention with Dr. Augustin Coupe.  He is now indicated for left upper extremity AV graft.  Findings: There was a covered stent in the left axillary vein.  After removing this attempted thrombectomy we then dissected out the axillary vein as high as we could possibly reach.  We then performed graft but at completion there were strong pulsatility.  Venogram demonstrated no significant runoff.  We able to cross the occluded vein placed a new stent into our graft.  Completion was a strong thrill and centrally he has no residual stenosis.   Procedure:  The patient was identified in the holding area and taken to the operating room was placed supine the operative table and general anesthesia was induced.  He was sterilely prepped and draped in the left upper extremity usual fashion, antibiotics were minister, a timeout was called.  We began with longitudinal incision in the left axilla we dissected down to the vein where we encountered a stent.  We dissected up on the vein as far as we could and then transected it.  I was able to remove the stent entirely piecemeal.  We then cannulated this with a vessel cannula and tied a silk suture around.  3000 units of heparin was administered at this time.  We performed central venography which demonstrated occlusion of the axillary vein.  We then  dissected further up on the vein as far as we could ligating branches between clips and ties.  We attempted to pass a Fogarty catheter but it would not pass centrally.  When I could no longer get blood flow we then clamped and spatulated and sewed the graft end-to-end with 5-0 Prolene suture.  We then flushed this graft with heparinized saline tunneled to a counterincision overlying the palpable brachial pulse.  We then dissected out the brachial artery placed a vessel loop around this.  He was clamped distally proximally opened longitudinally and the graft was trimmed to size and sewn end-to-side with 6-0 Prolene suture.  This time prior to completion anastomosis we allowed flushing all directions.  We then flushed with heparinized saline.  Upon completion there were strong pulsatility in the graft.  There was a palpable radial pulse of the wrist.  We then cannulated the graft with a micropuncture needle followed by wire and sheath.  Fistulogram demonstrated no runoff via the graft.  We then exchanged for a 5 French sheath and using Glidewire advantage and bare catheter were able to cross into the central system.  We then exchanged for 7 French sheath performed balloon angioplasty of our occluded vein.  Completion angiogram demonstrated significant residual stenosis and so we elected to place a 8x50 mm covered stent.  This was then postdilated with 8 mm balloon.  At completion there was now strong thrill in the runoff vein and completion angiogram demonstrated brisk flow without residual stenosis.  Satisfied we obtained hemostasis and all wounds  and irrigated.  We closed in layers with Vicryl Monocryl and Dermabond placed above that.  He was then away from anesthesia having tolerated procedure well without immediate complication.  All counts were correct at completion.  EBL: 500 cc   Jameia Makris C. Donzetta Matters, MD [facility] [place] Office: [phone] Pager: [phone]

## 2018-07-26 NOTE — Discharge Instructions (Signed)
[facility]  Discharge Instructions  AV Fistula or Graft Surgery for Dialysis Access  Please refer to the following instructions for your post-procedure care. Your surgeon or physician assistant will discuss any changes with you.  Activity  You may drive the day following your surgery, if you are comfortable and no longer taking prescription pain medication. Resume full activity as the soreness in your incision resolves.  Bathing/Showering  You may shower after you go home. Keep your incision dry for 48 hours. Do not soak in a bathtub, hot tub, or swim until the incision heals completely. You may not shower if you have a hemodialysis catheter.  Incision Care  Clean your incision with mild soap and water after 48 hours. Pat the area dry with a clean towel. You do not need a bandage unless otherwise instructed. Do not apply any ointments or creams to your incision. You may have skin glue on your incision. Do not peel it off. It will come off on its own in about one week. Your arm may swell a bit after surgery. To reduce swelling use pillows to elevate your arm so it is above your heart. Your doctor will tell you if you need to lightly wrap your arm with an ACE bandage.  Diet  Resume your normal diet. There are not special food restrictions following this procedure. In order to heal from your surgery, it is CRITICAL to get adequate nutrition. Your body requires vitamins, minerals, and protein. Vegetables are the best source of vitamins and minerals. Vegetables also provide the perfect balance of protein. Processed food has little nutritional value, so try to avoid this.  Medications  Resume taking all of your medications. If your incision is causing pain, you may take over-the counter pain relievers such as acetaminophen (Tylenol). If you were prescribed a stronger pain medication, please be aware these medications can cause nausea and constipation. Prevent  nausea by taking the medication with a snack or meal. Avoid constipation by drinking plenty of fluids and eating foods with high amount of fiber, such as fruits, vegetables, and grains.  Do not take Tylenol if you are taking prescription pain medications.  Follow up Your surgeon may want to see you in the office following your access surgery. If so, this will be arranged at the time of your surgery.  Please call us immediately for any of the following conditions:  Increased pain, redness, drainage (pus) from your incision site Fever of 101 degrees or higher Severe or worsening pain at your incision site Hand pain or numbness.  Reduce your risk of vascular disease:  Stop smoking. If you would like help, call [facility] at 1-800-QUIT-NOW 858-365-9539) or [place] at [place] your cholesterol Maintain a desired weight Control your diabetes Keep your blood pressure down  Dialysis  It will take several weeks to several months for your new dialysis access to be ready for use. Your surgeon will determine when it is okay to use it. Your nephrologist will continue to direct your dialysis. You can continue to use your Permcath until your new access is ready for use.   07/26/2018 SANDEEP RADELL [id number] [birth year]  Surgeon(s): Waynetta Sandy, MD  Procedure(s): INSERTION OF ARTERIOVENOUS (AV) GORE-TEX GRAFT LEFT ARM  x Do not stick graft for 4 weeks    If you have any questions, please call the office at [phone].   Post Anesthesia Home Care Instructions  Activity: Get plenty of rest for  the remainder of the day. A responsible individual must stay with you for 24 hours following the procedure.  For the next 24 hours, DO NOT: -Drive a car -Paediatric nurse -Drink alcoholic beverages -Take any medication unless instructed by your physician -Make any legal decisions or sign important papers.  Meals: Start with liquid foods such as gelatin or  soup. Progress to regular foods as tolerated. Avoid greasy, spicy, heavy foods. If nausea and/or vomiting occur, drink only clear liquids until the nausea and/or vomiting subsides. Call your physician if vomiting continues.  Special Instructions/Symptoms: Your throat may feel dry or sore from the anesthesia or the breathing tube placed in your throat during surgery. If this causes discomfort, gargle with warm salt water. The discomfort should disappear within 24 hours.  If you had a scopolamine patch placed behind your ear for the management of post- operative nausea and/or vomiting:  1. The medication in the patch is effective for 72 hours, after which it should be removed.  Wrap patch in a tissue and discard in the trash. Wash hands thoroughly with soap and water. 2. You may remove the patch earlier than 72 hours if you experience unpleasant side effects which may include dry mouth, dizziness or visual disturbances. 3. Avoid touching the patch. Wash your hands with soap and water after contact with the patch.

## 2018-07-26 NOTE — H&P (Signed)
   History and Physical Update  The patient was interviewed and re-examined.  The patient's previous History and Physical has been reviewed and is unchanged from recent office visit. Plan for left arm avg today in OR.   Violanda Bobeck C. Donzetta Matters, MD [facility] [place] Office: [phone] Pager: [phone]    07/26/2018, 8:39 AM

## 2018-07-26 NOTE — Anesthesia Procedure Notes (Signed)
Procedure Name: LMA Insertion Date/Time: 07/26/2018 10:50 AM Performed by: Jenne Campus, CRNA Pre-anesthesia Checklist: Patient identified, Emergency Drugs available, Suction available and Patient being monitored Patient Re-evaluated:Patient Re-evaluated prior to induction Oxygen Delivery Method: Circle System Utilized Preoxygenation: Pre-oxygenation with 100% oxygen Induction Type: IV induction Ventilation: Mask ventilation without difficulty LMA: LMA inserted LMA Size: 4.0 Number of attempts: 1 Airway Equipment and Method: Bite block Placement Confirmation: positive ETCO2 and breath sounds checked- equal and bilateral Tube secured with: Tape Dental Injury: Teeth and Oropharynx as per pre-operative assessment

## 2018-07-26 NOTE — Anesthesia Preprocedure Evaluation (Addendum)
Anesthesia Evaluation  Patient identified by MRN, date of birth, ID band Patient awake    Reviewed: Allergy & Precautions, NPO status , Patient's Chart, lab work & pertinent test results  Airway Mallampati: II  TM Distance: >3 FB Neck ROM: Full    Dental  (+) Teeth Intact, Dental Advisory Given   Pulmonary Current Smoker,    breath sounds clear to auscultation       Cardiovascular hypertension, Pt. on medications and Pt. on home beta blockers  Rhythm:Regular Rate:Normal     Neuro/Psych Left leg weakness; uses cane to ambulate CVA, Residual Symptoms    GI/Hepatic   Endo/Other  diabetes, Type 2  Renal/GU ESRF and DialysisRenal diseaseLast HD Tues 07/25/18     Musculoskeletal   Abdominal   Peds  Hematology   Anesthesia Other Findings   Reproductive/Obstetrics                            Anesthesia Physical Anesthesia Plan  ASA: III  Anesthesia Plan: General   Post-op Pain Management:    Induction: Intravenous  PONV Risk Score and Plan: Ondansetron and Propofol infusion  Airway Management Planned: LMA  Additional Equipment:   Intra-op Plan:   Post-operative Plan:   Informed Consent: I have reviewed the patients History and Physical, chart, labs and discussed the procedure including the risks, benefits and alternatives for the proposed anesthesia with the patient or authorized representative who has indicated his/her understanding and acceptance.     Dental advisory given  Plan Discussed with: CRNA and Anesthesiologist  Anesthesia Plan Comments:         Anesthesia Quick Evaluation

## 2018-07-26 NOTE — Anesthesia Postprocedure Evaluation (Signed)
Anesthesia Post Note  Patient: Christopher Lopez  Procedure(s) Performed: INSERTION OF ARTERIOVENOUS (AV) GORE-TEX GRAFT ARM (Left Arm Upper) THROMBECTOMY LEFT UPPER EXTREMITY (Left Arm Upper) STENT LEFT AXILLARY VEIN (Left Arm Upper)     Patient location during evaluation: PACU Anesthesia Type: General Level of consciousness: awake and alert Pain management: pain level controlled Vital Signs Assessment: post-procedure vital signs reviewed and stable Respiratory status: spontaneous breathing, nonlabored ventilation, respiratory function stable and patient connected to nasal cannula oxygen Cardiovascular status: blood pressure returned to baseline and stable Postop Assessment: no apparent nausea or vomiting Anesthetic complications: no    Last Vitals:  Vitals:   07/26/18 1430 07/26/18 1450  BP:  125/77  Pulse:  85  Resp:  12  Temp: 36.5 C   SpO2:  98%    Last Pain:  Vitals:   07/26/18 1450  TempSrc:   PainSc: 2                  Kelcie Currie COKER

## 2018-07-26 NOTE — Transfer of Care (Signed)
Immediate Anesthesia Transfer of Care Note  Patient: Christopher Lopez  Procedure(s) Performed: INSERTION OF ARTERIOVENOUS (AV) GORE-TEX GRAFT ARM (Left Arm Upper)  Patient Location: PACU  Anesthesia Type:General  Level of Consciousness: awake, oriented and patient cooperative  Airway & Oxygen Therapy: Patient Spontanous Breathing and Patient connected to nasal cannula oxygen  Post-op Assessment: Report given to RN and Post -op Vital signs reviewed and stable  Post vital signs: Reviewed  Last Vitals:  Vitals Value Taken Time  BP 114/76 07/26/2018  1:42 PM  Temp    Pulse 97 07/26/2018  1:43 PM  Resp 12 07/26/2018  1:43 PM  SpO2 100 % 07/26/2018  1:43 PM  Vitals shown include unvalidated device data.  Last Pain:  Vitals:   07/26/18 0841  TempSrc:   PainSc: 0-No pain      Patients Stated Pain Goal: 4 (56/70/14 1030)  Complications: No apparent anesthesia complications

## 2018-07-27 ENCOUNTER — Encounter (HOSPITAL_COMMUNITY): Payer: Self-pay | Admitting: Vascular Surgery

## 2018-07-27 DIAGNOSIS — D509 Iron deficiency anemia, unspecified: Secondary | ICD-10-CM | POA: Diagnosis not present

## 2018-07-27 DIAGNOSIS — N2581 Secondary hyperparathyroidism of renal origin: Secondary | ICD-10-CM | POA: Diagnosis not present

## 2018-07-27 DIAGNOSIS — D689 Coagulation defect, unspecified: Secondary | ICD-10-CM | POA: Diagnosis not present

## 2018-07-27 DIAGNOSIS — E1129 Type 2 diabetes mellitus with other diabetic kidney complication: Secondary | ICD-10-CM | POA: Diagnosis not present

## 2018-07-27 DIAGNOSIS — N186 End stage renal disease: Secondary | ICD-10-CM | POA: Diagnosis not present

## 2018-07-27 DIAGNOSIS — Z4901 Encounter for fitting and adjustment of extracorporeal dialysis catheter: Secondary | ICD-10-CM | POA: Diagnosis not present

## 2018-07-27 DIAGNOSIS — D631 Anemia in chronic kidney disease: Secondary | ICD-10-CM | POA: Diagnosis not present

## 2018-07-29 DIAGNOSIS — E1129 Type 2 diabetes mellitus with other diabetic kidney complication: Secondary | ICD-10-CM | POA: Diagnosis not present

## 2018-07-29 DIAGNOSIS — Z4901 Encounter for fitting and adjustment of extracorporeal dialysis catheter: Secondary | ICD-10-CM | POA: Diagnosis not present

## 2018-07-29 DIAGNOSIS — N186 End stage renal disease: Secondary | ICD-10-CM | POA: Diagnosis not present

## 2018-07-29 DIAGNOSIS — D689 Coagulation defect, unspecified: Secondary | ICD-10-CM | POA: Diagnosis not present

## 2018-07-29 DIAGNOSIS — N2581 Secondary hyperparathyroidism of renal origin: Secondary | ICD-10-CM | POA: Diagnosis not present

## 2018-07-29 DIAGNOSIS — D509 Iron deficiency anemia, unspecified: Secondary | ICD-10-CM | POA: Diagnosis not present

## 2018-07-29 DIAGNOSIS — D631 Anemia in chronic kidney disease: Secondary | ICD-10-CM | POA: Diagnosis not present

## 2018-08-01 DIAGNOSIS — E1129 Type 2 diabetes mellitus with other diabetic kidney complication: Secondary | ICD-10-CM | POA: Diagnosis not present

## 2018-08-01 DIAGNOSIS — Z4901 Encounter for fitting and adjustment of extracorporeal dialysis catheter: Secondary | ICD-10-CM | POA: Diagnosis not present

## 2018-08-01 DIAGNOSIS — N186 End stage renal disease: Secondary | ICD-10-CM | POA: Diagnosis not present

## 2018-08-01 DIAGNOSIS — D509 Iron deficiency anemia, unspecified: Secondary | ICD-10-CM | POA: Diagnosis not present

## 2018-08-01 DIAGNOSIS — D631 Anemia in chronic kidney disease: Secondary | ICD-10-CM | POA: Diagnosis not present

## 2018-08-01 DIAGNOSIS — D689 Coagulation defect, unspecified: Secondary | ICD-10-CM | POA: Diagnosis not present

## 2018-08-01 DIAGNOSIS — N2581 Secondary hyperparathyroidism of renal origin: Secondary | ICD-10-CM | POA: Diagnosis not present

## 2018-08-03 DIAGNOSIS — E1129 Type 2 diabetes mellitus with other diabetic kidney complication: Secondary | ICD-10-CM | POA: Diagnosis not present

## 2018-08-03 DIAGNOSIS — Z4901 Encounter for fitting and adjustment of extracorporeal dialysis catheter: Secondary | ICD-10-CM | POA: Diagnosis not present

## 2018-08-03 DIAGNOSIS — D689 Coagulation defect, unspecified: Secondary | ICD-10-CM | POA: Diagnosis not present

## 2018-08-03 DIAGNOSIS — N186 End stage renal disease: Secondary | ICD-10-CM | POA: Diagnosis not present

## 2018-08-03 DIAGNOSIS — N2581 Secondary hyperparathyroidism of renal origin: Secondary | ICD-10-CM | POA: Diagnosis not present

## 2018-08-03 DIAGNOSIS — D631 Anemia in chronic kidney disease: Secondary | ICD-10-CM | POA: Diagnosis not present

## 2018-08-03 DIAGNOSIS — D509 Iron deficiency anemia, unspecified: Secondary | ICD-10-CM | POA: Diagnosis not present

## 2018-08-05 DIAGNOSIS — D509 Iron deficiency anemia, unspecified: Secondary | ICD-10-CM | POA: Diagnosis not present

## 2018-08-05 DIAGNOSIS — N186 End stage renal disease: Secondary | ICD-10-CM | POA: Diagnosis not present

## 2018-08-05 DIAGNOSIS — D689 Coagulation defect, unspecified: Secondary | ICD-10-CM | POA: Diagnosis not present

## 2018-08-05 DIAGNOSIS — D631 Anemia in chronic kidney disease: Secondary | ICD-10-CM | POA: Diagnosis not present

## 2018-08-05 DIAGNOSIS — E1129 Type 2 diabetes mellitus with other diabetic kidney complication: Secondary | ICD-10-CM | POA: Diagnosis not present

## 2018-08-05 DIAGNOSIS — Z4901 Encounter for fitting and adjustment of extracorporeal dialysis catheter: Secondary | ICD-10-CM | POA: Diagnosis not present

## 2018-08-05 DIAGNOSIS — N2581 Secondary hyperparathyroidism of renal origin: Secondary | ICD-10-CM | POA: Diagnosis not present

## 2018-08-08 DIAGNOSIS — N186 End stage renal disease: Secondary | ICD-10-CM | POA: Diagnosis not present

## 2018-08-08 DIAGNOSIS — D509 Iron deficiency anemia, unspecified: Secondary | ICD-10-CM | POA: Diagnosis not present

## 2018-08-08 DIAGNOSIS — N2581 Secondary hyperparathyroidism of renal origin: Secondary | ICD-10-CM | POA: Diagnosis not present

## 2018-08-08 DIAGNOSIS — D631 Anemia in chronic kidney disease: Secondary | ICD-10-CM | POA: Diagnosis not present

## 2018-08-08 DIAGNOSIS — E1129 Type 2 diabetes mellitus with other diabetic kidney complication: Secondary | ICD-10-CM | POA: Diagnosis not present

## 2018-08-08 DIAGNOSIS — Z4901 Encounter for fitting and adjustment of extracorporeal dialysis catheter: Secondary | ICD-10-CM | POA: Diagnosis not present

## 2018-08-08 DIAGNOSIS — D689 Coagulation defect, unspecified: Secondary | ICD-10-CM | POA: Diagnosis not present

## 2018-08-10 DIAGNOSIS — N186 End stage renal disease: Secondary | ICD-10-CM | POA: Diagnosis not present

## 2018-08-10 DIAGNOSIS — E1129 Type 2 diabetes mellitus with other diabetic kidney complication: Secondary | ICD-10-CM | POA: Diagnosis not present

## 2018-08-10 DIAGNOSIS — N2581 Secondary hyperparathyroidism of renal origin: Secondary | ICD-10-CM | POA: Diagnosis not present

## 2018-08-10 DIAGNOSIS — D689 Coagulation defect, unspecified: Secondary | ICD-10-CM | POA: Diagnosis not present

## 2018-08-10 DIAGNOSIS — Z4901 Encounter for fitting and adjustment of extracorporeal dialysis catheter: Secondary | ICD-10-CM | POA: Diagnosis not present

## 2018-08-10 DIAGNOSIS — D509 Iron deficiency anemia, unspecified: Secondary | ICD-10-CM | POA: Diagnosis not present

## 2018-08-10 DIAGNOSIS — D631 Anemia in chronic kidney disease: Secondary | ICD-10-CM | POA: Diagnosis not present

## 2018-08-11 DIAGNOSIS — N186 End stage renal disease: Secondary | ICD-10-CM | POA: Diagnosis not present

## 2018-08-11 DIAGNOSIS — E1122 Type 2 diabetes mellitus with diabetic chronic kidney disease: Secondary | ICD-10-CM | POA: Diagnosis not present

## 2018-08-11 DIAGNOSIS — Z992 Dependence on renal dialysis: Secondary | ICD-10-CM | POA: Diagnosis not present

## 2018-08-12 DIAGNOSIS — N2581 Secondary hyperparathyroidism of renal origin: Secondary | ICD-10-CM | POA: Diagnosis not present

## 2018-08-12 DIAGNOSIS — D509 Iron deficiency anemia, unspecified: Secondary | ICD-10-CM | POA: Diagnosis not present

## 2018-08-12 DIAGNOSIS — D631 Anemia in chronic kidney disease: Secondary | ICD-10-CM | POA: Diagnosis not present

## 2018-08-12 DIAGNOSIS — Z4901 Encounter for fitting and adjustment of extracorporeal dialysis catheter: Secondary | ICD-10-CM | POA: Diagnosis not present

## 2018-08-12 DIAGNOSIS — N186 End stage renal disease: Secondary | ICD-10-CM | POA: Diagnosis not present

## 2018-08-12 DIAGNOSIS — D689 Coagulation defect, unspecified: Secondary | ICD-10-CM | POA: Diagnosis not present

## 2018-08-15 DIAGNOSIS — D689 Coagulation defect, unspecified: Secondary | ICD-10-CM | POA: Diagnosis not present

## 2018-08-15 DIAGNOSIS — D509 Iron deficiency anemia, unspecified: Secondary | ICD-10-CM | POA: Diagnosis not present

## 2018-08-15 DIAGNOSIS — N186 End stage renal disease: Secondary | ICD-10-CM | POA: Diagnosis not present

## 2018-08-15 DIAGNOSIS — D631 Anemia in chronic kidney disease: Secondary | ICD-10-CM | POA: Diagnosis not present

## 2018-08-15 DIAGNOSIS — N2581 Secondary hyperparathyroidism of renal origin: Secondary | ICD-10-CM | POA: Diagnosis not present

## 2018-08-15 DIAGNOSIS — Z4901 Encounter for fitting and adjustment of extracorporeal dialysis catheter: Secondary | ICD-10-CM | POA: Diagnosis not present

## 2018-08-17 DIAGNOSIS — N2581 Secondary hyperparathyroidism of renal origin: Secondary | ICD-10-CM | POA: Diagnosis not present

## 2018-08-17 DIAGNOSIS — D689 Coagulation defect, unspecified: Secondary | ICD-10-CM | POA: Diagnosis not present

## 2018-08-17 DIAGNOSIS — N186 End stage renal disease: Secondary | ICD-10-CM | POA: Diagnosis not present

## 2018-08-17 DIAGNOSIS — D631 Anemia in chronic kidney disease: Secondary | ICD-10-CM | POA: Diagnosis not present

## 2018-08-17 DIAGNOSIS — D509 Iron deficiency anemia, unspecified: Secondary | ICD-10-CM | POA: Diagnosis not present

## 2018-08-17 DIAGNOSIS — Z4901 Encounter for fitting and adjustment of extracorporeal dialysis catheter: Secondary | ICD-10-CM | POA: Diagnosis not present

## 2018-08-19 DIAGNOSIS — D689 Coagulation defect, unspecified: Secondary | ICD-10-CM | POA: Diagnosis not present

## 2018-08-19 DIAGNOSIS — D509 Iron deficiency anemia, unspecified: Secondary | ICD-10-CM | POA: Diagnosis not present

## 2018-08-19 DIAGNOSIS — N186 End stage renal disease: Secondary | ICD-10-CM | POA: Diagnosis not present

## 2018-08-19 DIAGNOSIS — N2581 Secondary hyperparathyroidism of renal origin: Secondary | ICD-10-CM | POA: Diagnosis not present

## 2018-08-19 DIAGNOSIS — D631 Anemia in chronic kidney disease: Secondary | ICD-10-CM | POA: Diagnosis not present

## 2018-08-19 DIAGNOSIS — Z4901 Encounter for fitting and adjustment of extracorporeal dialysis catheter: Secondary | ICD-10-CM | POA: Diagnosis not present

## 2018-08-22 DIAGNOSIS — D509 Iron deficiency anemia, unspecified: Secondary | ICD-10-CM | POA: Diagnosis not present

## 2018-08-22 DIAGNOSIS — D631 Anemia in chronic kidney disease: Secondary | ICD-10-CM | POA: Diagnosis not present

## 2018-08-22 DIAGNOSIS — N2581 Secondary hyperparathyroidism of renal origin: Secondary | ICD-10-CM | POA: Diagnosis not present

## 2018-08-22 DIAGNOSIS — Z4901 Encounter for fitting and adjustment of extracorporeal dialysis catheter: Secondary | ICD-10-CM | POA: Diagnosis not present

## 2018-08-22 DIAGNOSIS — D689 Coagulation defect, unspecified: Secondary | ICD-10-CM | POA: Diagnosis not present

## 2018-08-22 DIAGNOSIS — N186 End stage renal disease: Secondary | ICD-10-CM | POA: Diagnosis not present

## 2018-08-23 ENCOUNTER — Ambulatory Visit: Payer: Medicare Other | Admitting: Cardiovascular Disease

## 2018-08-23 ENCOUNTER — Encounter: Payer: Self-pay | Admitting: Cardiovascular Disease

## 2018-08-23 DIAGNOSIS — I519 Heart disease, unspecified: Secondary | ICD-10-CM | POA: Diagnosis not present

## 2018-08-23 DIAGNOSIS — E782 Mixed hyperlipidemia: Secondary | ICD-10-CM | POA: Diagnosis not present

## 2018-08-23 DIAGNOSIS — F1010 Alcohol abuse, uncomplicated: Secondary | ICD-10-CM | POA: Diagnosis not present

## 2018-08-23 DIAGNOSIS — Z720 Tobacco use: Secondary | ICD-10-CM

## 2018-08-23 DIAGNOSIS — I10 Essential (primary) hypertension: Secondary | ICD-10-CM

## 2018-08-23 NOTE — Assessment & Plan Note (Signed)
History of hyperlipidemia on statin therapy with lipid profile performed 08/11/2017 revealing total cholesterol 220, LDL 131 and HDL of 38.

## 2018-08-23 NOTE — Assessment & Plan Note (Signed)
History of heavy alcohol abuse until 3 years ago.  He did drink 6 beers a day at that time.

## 2018-08-23 NOTE — Progress Notes (Signed)
08/23/2018 Christopher Lopez   1961/03/27  [id number]  Primary Physician Biagio Borg, MD Primary Cardiologist: Lorretta Harp MD Lupe Carney, [place]  HPI:  Christopher Lopez is a 58 y.o. mildly overweight divorced African-American male father 9, grandfather of 6 grandchildren referred by Dr. Cathlean Cower for cardiovascular evaluation because of mild LV dysfunction.  His cardiac risk factors include continued tobacco abuse of 1 pack/week though he smoked more than that in the past.  He has treated hypertension, diabetes and hyperlipidemia.  His sister did die of a myocardial infarction.  He is never had a heart attack but has had a stroke.  He denies chest pain or shortness of breath.  He is been on hemodialysis for the last 6 months and recently had AV fistula placed by Dr. Donzetta Matters.  His blood pressure became better controlled after going on dialysis.  He had a recent 2D echo performed 07/17/2018 which revealed an EF of 45 to 50% mild, mild concentric LVH with grade 1 diastolic dysfunction.   Current Meds  Medication Sig  . acetaminophen (TYLENOL) 650 MG CR tablet Take 650 mg by mouth daily.  Marland Kitchen aspirin EC 81 MG tablet Take 1 tablet (81 mg total) by mouth daily.  Marland Kitchen diltiazem (CARDIZEM LA) 360 MG 24 hr tablet Take 360 mg by mouth daily.  . Dulaglutide (TRULICITY) 1.5 WK/4.6KM SOPN Inject 0.5 mLs into the skin once a week. (Patient taking differently: Inject 1.5 mg into the skin every Sunday. )  . glucose blood (ONE TOUCH ULTRA TEST) test strip Use as instructed once daily E11.9  . hydrALAZINE (APRESOLINE) 100 MG tablet Take 100 mg by mouth 3 (three) times daily.  . Lancets (ONETOUCH ULTRASOFT) lancets 1 each by Other route 2 (two) times daily. Use to check blood sugars once a day Dx e11.9  . lovastatin (MEVACOR) 40 MG tablet TAKE 1 TABLET BY MOUTH  DAILY. (Patient taking differently: Take 40 mg by mouth every evening. )  . metoprolol succinate (TOPROL-XL) 50 MG 24 hr tablet TAKE 1 TABLET BY  MOUTH  DAILY WITH OR IMMEDIATLEY  FOLLOWING A MEAL (Patient taking differently: Take 50 mg by mouth daily. )  . Multiple Vitamin (MULTIVITAMIN) tablet Take 1 tablet by mouth daily.  Marland Kitchen oxyCODONE-acetaminophen (PERCOCET) 5-325 MG tablet Take 1 tablet by mouth every 6 (six) hours as needed for severe pain.  . sitaGLIPtin (JANUVIA) 100 MG tablet Take 1 tablet (100 mg total) by mouth daily.  . vitamin B-12 (CYANOCOBALAMIN) 1000 MCG tablet Take 1,000 mcg by mouth daily.     No Known Allergies  Social History   Socioeconomic History  . Marital status: Single    Spouse name: Not on file  . Number of children: Not on file  . Years of education: Not on file  . Highest education level: Not on file  Occupational History  . Not on file  Social Needs  . Financial resource strain: Not on file  . Food insecurity:    Worry: Not on file    Inability: Not on file  . Transportation needs:    Medical: Not on file    Non-medical: Not on file  Tobacco Use  . Smoking status: Former Smoker    Packs/day: 0.50    Types: Cigarettes  . Smokeless tobacco: Never Used  Substance and Sexual Activity  . Alcohol use: Not Currently    Alcohol/week: 2.0 standard drinks    Types: 2 Cans of beer per week  .  Drug use: No  . Sexual activity: Not on file  Lifestyle  . Physical activity:    Days per week: Not on file    Minutes per session: Not on file  . Stress: Not on file  Relationships  . Social connections:    Talks on phone: Not on file    Gets together: Not on file    Attends religious service: Not on file    Active member of club or organization: Not on file    Attends meetings of clubs or organizations: Not on file    Relationship status: Not on file  . Intimate partner violence:    Fear of current or ex partner: Not on file    Emotionally abused: Not on file    Physically abused: Not on file    Forced sexual activity: Not on file  Other Topics Concern  . Not on file  Social History Narrative   . Not on file     Review of Systems: General: negative for chills, fever, night sweats or weight changes.  Cardiovascular: negative for chest pain, dyspnea on exertion, edema, orthopnea, palpitations, paroxysmal nocturnal dyspnea or shortness of breath Dermatological: negative for rash Respiratory: negative for cough or wheezing Urologic: negative for hematuria Abdominal: negative for nausea, vomiting, diarrhea, bright red blood per rectum, melena, or hematemesis Neurologic: negative for visual changes, syncope, or dizziness All other systems reviewed and are otherwise negative except as noted above.    Blood pressure 126/60, pulse 93, height 6' (1.829 m), weight 162 lb (73.5 kg).  General appearance: alert and no distress Neck: no adenopathy, no carotid bruit, no JVD, supple, symmetrical, trachea midline and thyroid not enlarged, symmetric, no tenderness/mass/nodules Lungs: clear to auscultation bilaterally Heart: regular rate and rhythm, S1, S2 normal, no murmur, click, rub or gallop Extremities: extremities normal, atraumatic, no cyanosis or edema Pulses: 2+ and symmetric Skin: Skin color, texture, turgor normal. No rashes or lesions Neurologic: Alert and oriented X 3, normal strength and tone. Normal symmetric reflexes. Normal coordination and gait  EKG sinus rhythm at 93 without ST or T wave changes.  There was left axis deviation.  I personally reviewed this EKG.  ASSESSMENT AND PLAN:   HTN (hypertension) History of essential hypertension blood pressure measured today 126/60.  He is on hydralazine, diltiazem and metoprolol.  Alcohol abuse History of heavy alcohol abuse until 3 years ago.  He did drink 6 beers a day at that time.  Hyperlipidemia History of hyperlipidemia on statin therapy with lipid profile performed 08/11/2017 revealing total cholesterol 220, LDL 131 and HDL of 38.  Left ventricular dysfunction History of mild left ventricular dysfunction with recent 2D  echo performed 07/17/2018 revealing an EF of 45 to 83%, grade 1 diastolic dysfunction with mild LVH.  The patient has no symptoms of heart failure.  I suspect this is multifactorial from long history of hypertension poorly controlled as well as a long history of ethanol abuse.  Tobacco abuse History of ongoing tobacco abuse of 1 pack/week recalcitrant to risk factor modification.      Lorretta Harp MD FACP,FACC,FAHA, [facility] 08/23/2018 11:36 AM

## 2018-08-23 NOTE — Assessment & Plan Note (Signed)
History of ongoing tobacco abuse of 1 pack/week recalcitrant to risk factor modification.

## 2018-08-23 NOTE — Patient Instructions (Signed)
Medication Instructions:  NONE If you need a refill on your cardiac medications before your next appointment, please call your pharmacy.   Lab work: NONE If you have labs (blood work) drawn today and your tests are completely normal, you will receive your results only by: Marland Kitchen MyChart Message (if you have MyChart) OR . A paper copy in the mail If you have any lab test that is abnormal or we need to change your treatment, we will call you to review the results.  Testing/Procedures: NONE  Follow-Up: At [facility], you and your health needs are our priority.  As part of our continuing mission to provide you with exceptional heart care, we have created designated Provider Care Teams.  These Care Teams include your primary Cardiologist (physician) and Advanced Practice Providers (APPs -  Physician Assistants and Nurse Practitioners) who all work together to provide you with the care you need, when you need it. . You MAY SCHEDULE a follow up appointment AS NEEDED You may see Dr. Gwenlyn Found or one of the following Advanced Practice Providers on your designated Care Team:   . Kerin Ransom, [place] . Almyra Deforest, PA-C . Fabian Sharp, PA-C . Jory Sims, DNP . Rosaria Ferries, PA-C . Roby Lofts, PA-C . Sande Rives, PA-C

## 2018-08-23 NOTE — Assessment & Plan Note (Signed)
History of mild left ventricular dysfunction with recent 2D echo performed 07/17/2018 revealing an EF of 45 to 90%, grade 1 diastolic dysfunction with mild LVH.  The patient has no symptoms of heart failure.  I suspect this is multifactorial from long history of hypertension poorly controlled as well as a long history of ethanol abuse.

## 2018-08-23 NOTE — Assessment & Plan Note (Signed)
History of essential hypertension blood pressure measured today 126/60.  He is on hydralazine, diltiazem and metoprolol.

## 2018-08-24 DIAGNOSIS — D689 Coagulation defect, unspecified: Secondary | ICD-10-CM | POA: Diagnosis not present

## 2018-08-24 DIAGNOSIS — Z4901 Encounter for fitting and adjustment of extracorporeal dialysis catheter: Secondary | ICD-10-CM | POA: Diagnosis not present

## 2018-08-24 DIAGNOSIS — N2581 Secondary hyperparathyroidism of renal origin: Secondary | ICD-10-CM | POA: Diagnosis not present

## 2018-08-24 DIAGNOSIS — N186 End stage renal disease: Secondary | ICD-10-CM | POA: Diagnosis not present

## 2018-08-24 DIAGNOSIS — D509 Iron deficiency anemia, unspecified: Secondary | ICD-10-CM | POA: Diagnosis not present

## 2018-08-24 DIAGNOSIS — D631 Anemia in chronic kidney disease: Secondary | ICD-10-CM | POA: Diagnosis not present

## 2018-08-26 DIAGNOSIS — Z4901 Encounter for fitting and adjustment of extracorporeal dialysis catheter: Secondary | ICD-10-CM | POA: Diagnosis not present

## 2018-08-26 DIAGNOSIS — D689 Coagulation defect, unspecified: Secondary | ICD-10-CM | POA: Diagnosis not present

## 2018-08-26 DIAGNOSIS — D631 Anemia in chronic kidney disease: Secondary | ICD-10-CM | POA: Diagnosis not present

## 2018-08-26 DIAGNOSIS — N2581 Secondary hyperparathyroidism of renal origin: Secondary | ICD-10-CM | POA: Diagnosis not present

## 2018-08-26 DIAGNOSIS — N186 End stage renal disease: Secondary | ICD-10-CM | POA: Diagnosis not present

## 2018-08-26 DIAGNOSIS — D509 Iron deficiency anemia, unspecified: Secondary | ICD-10-CM | POA: Diagnosis not present

## 2018-08-29 DIAGNOSIS — N2581 Secondary hyperparathyroidism of renal origin: Secondary | ICD-10-CM | POA: Diagnosis not present

## 2018-08-29 DIAGNOSIS — D631 Anemia in chronic kidney disease: Secondary | ICD-10-CM | POA: Diagnosis not present

## 2018-08-29 DIAGNOSIS — Z4901 Encounter for fitting and adjustment of extracorporeal dialysis catheter: Secondary | ICD-10-CM | POA: Diagnosis not present

## 2018-08-29 DIAGNOSIS — D689 Coagulation defect, unspecified: Secondary | ICD-10-CM | POA: Diagnosis not present

## 2018-08-29 DIAGNOSIS — N186 End stage renal disease: Secondary | ICD-10-CM | POA: Diagnosis not present

## 2018-08-29 DIAGNOSIS — D509 Iron deficiency anemia, unspecified: Secondary | ICD-10-CM | POA: Diagnosis not present

## 2018-08-31 DIAGNOSIS — Z4901 Encounter for fitting and adjustment of extracorporeal dialysis catheter: Secondary | ICD-10-CM | POA: Diagnosis not present

## 2018-08-31 DIAGNOSIS — D689 Coagulation defect, unspecified: Secondary | ICD-10-CM | POA: Diagnosis not present

## 2018-08-31 DIAGNOSIS — N2581 Secondary hyperparathyroidism of renal origin: Secondary | ICD-10-CM | POA: Diagnosis not present

## 2018-08-31 DIAGNOSIS — D509 Iron deficiency anemia, unspecified: Secondary | ICD-10-CM | POA: Diagnosis not present

## 2018-08-31 DIAGNOSIS — N186 End stage renal disease: Secondary | ICD-10-CM | POA: Diagnosis not present

## 2018-08-31 DIAGNOSIS — D631 Anemia in chronic kidney disease: Secondary | ICD-10-CM | POA: Diagnosis not present

## 2018-09-02 DIAGNOSIS — N2581 Secondary hyperparathyroidism of renal origin: Secondary | ICD-10-CM | POA: Diagnosis not present

## 2018-09-02 DIAGNOSIS — D689 Coagulation defect, unspecified: Secondary | ICD-10-CM | POA: Diagnosis not present

## 2018-09-02 DIAGNOSIS — D509 Iron deficiency anemia, unspecified: Secondary | ICD-10-CM | POA: Diagnosis not present

## 2018-09-02 DIAGNOSIS — D631 Anemia in chronic kidney disease: Secondary | ICD-10-CM | POA: Diagnosis not present

## 2018-09-02 DIAGNOSIS — N186 End stage renal disease: Secondary | ICD-10-CM | POA: Diagnosis not present

## 2018-09-02 DIAGNOSIS — Z4901 Encounter for fitting and adjustment of extracorporeal dialysis catheter: Secondary | ICD-10-CM | POA: Diagnosis not present

## 2018-09-05 DIAGNOSIS — N2581 Secondary hyperparathyroidism of renal origin: Secondary | ICD-10-CM | POA: Diagnosis not present

## 2018-09-05 DIAGNOSIS — Z4901 Encounter for fitting and adjustment of extracorporeal dialysis catheter: Secondary | ICD-10-CM | POA: Diagnosis not present

## 2018-09-05 DIAGNOSIS — N186 End stage renal disease: Secondary | ICD-10-CM | POA: Diagnosis not present

## 2018-09-05 DIAGNOSIS — D631 Anemia in chronic kidney disease: Secondary | ICD-10-CM | POA: Diagnosis not present

## 2018-09-05 DIAGNOSIS — D689 Coagulation defect, unspecified: Secondary | ICD-10-CM | POA: Diagnosis not present

## 2018-09-05 DIAGNOSIS — D509 Iron deficiency anemia, unspecified: Secondary | ICD-10-CM | POA: Diagnosis not present

## 2018-09-06 ENCOUNTER — Encounter: Payer: Self-pay | Admitting: Internal Medicine

## 2018-09-06 ENCOUNTER — Ambulatory Visit (INDEPENDENT_AMBULATORY_CARE_PROVIDER_SITE_OTHER): Payer: Medicare Other | Admitting: Internal Medicine

## 2018-09-06 ENCOUNTER — Other Ambulatory Visit (INDEPENDENT_AMBULATORY_CARE_PROVIDER_SITE_OTHER): Payer: Medicare Other

## 2018-09-06 VITALS — BP 114/70 | HR 94 | Temp 98.0°F | Ht 72.0 in | Wt 161.0 lb

## 2018-09-06 DIAGNOSIS — Z720 Tobacco use: Secondary | ICD-10-CM | POA: Diagnosis not present

## 2018-09-06 DIAGNOSIS — Z0000 Encounter for general adult medical examination without abnormal findings: Secondary | ICD-10-CM

## 2018-09-06 DIAGNOSIS — E1129 Type 2 diabetes mellitus with other diabetic kidney complication: Secondary | ICD-10-CM

## 2018-09-06 DIAGNOSIS — Z23 Encounter for immunization: Secondary | ICD-10-CM

## 2018-09-06 LAB — BASIC METABOLIC PANEL
BUN: 39 mg/dL — ABNORMAL HIGH (ref 6–23)
CO2: 26 mEq/L (ref 19–32)
CREATININE: 7.88 mg/dL — AB (ref 0.40–1.50)
Calcium: 9.1 mg/dL (ref 8.4–10.5)
Chloride: 97 mEq/L (ref 96–112)
GFR: 8.55 mL/min — CL (ref >60.00)
Glucose, Bld: 89 mg/dL (ref 70–99)
Potassium: 5.1 mEq/L (ref 3.5–5.1)
Sodium: 137 mEq/L (ref 135–145)

## 2018-09-06 LAB — CBC WITH DIFFERENTIAL/PLATELET
Basophils Absolute: 0.2 10*3/uL — ABNORMAL HIGH (ref 0.0–0.1)
Basophils Relative: 1.6 % (ref 0.0–3.0)
EOS PCT: 2.6 % (ref 0.0–5.0)
Eosinophils Absolute: 0.2 10*3/uL (ref 0.0–0.7)
HCT: 39.8 % (ref 39.0–52.0)
Hemoglobin: 13.2 g/dL (ref 13.0–17.0)
Lymphocytes Relative: 24.5 % (ref 12.0–46.0)
Lymphs Abs: 2.3 10*3/uL (ref 0.7–4.0)
MCHC: 33.1 g/dL (ref 30.0–36.0)
MCV: 98.5 fl (ref 78.0–100.0)
Monocytes Absolute: 0.6 10*3/uL (ref 0.1–1.0)
Monocytes Relative: 6.7 % (ref 3.0–12.0)
Neutro Abs: 6.1 10*3/uL (ref 1.4–7.7)
Neutrophils Relative %: 64.6 % (ref 43.0–77.0)
Platelets: 207 10*3/uL (ref 150.0–400.0)
RBC: 4.04 Mil/uL — AB (ref 4.22–5.81)
RDW: 18.1 % — ABNORMAL HIGH (ref 11.5–15.5)
WBC: 9.4 10*3/uL (ref 4.0–10.5)

## 2018-09-06 LAB — LIPID PANEL
Cholesterol: 194 mg/dL (ref 0–200)
HDL: 45.3 mg/dL (ref >39.00)
LDL Cholesterol: 125 mg/dL — ABNORMAL HIGH (ref 0–99)
NonHDL: 148.86
Total CHOL/HDL Ratio: 4
Triglycerides: 120 mg/dL (ref 0.0–149.0)
VLDL: 24 mg/dL (ref 0.0–40.0)

## 2018-09-06 LAB — HEMOGLOBIN A1C: Hgb A1c MFr Bld: 4.8 % (ref 4.6–6.5)

## 2018-09-06 LAB — HEPATIC FUNCTION PANEL
ALBUMIN: 4.7 g/dL (ref 3.5–5.2)
ALK PHOS: 89 U/L (ref 39–117)
ALT: 9 U/L (ref 0–53)
AST: 14 U/L (ref 0–37)
Bilirubin, Direct: 0 mg/dL (ref 0.0–0.3)
Total Bilirubin: 0.4 mg/dL (ref 0.2–1.2)
Total Protein: 8.8 g/dL — ABNORMAL HIGH (ref 6.0–8.3)

## 2018-09-06 MED ORDER — SITAGLIPTIN PHOSPHATE 100 MG PO TABS: 100.0000 mg | ORAL_TABLET | Freq: Every day | ORAL | 3 refills | Status: DC

## 2018-09-06 NOTE — Assessment & Plan Note (Signed)
stable overall by history and exam, recent data reviewed with pt, and pt to continue medical treatment as before,  to f/u any worsening symptoms or concerns, for f/u labs 

## 2018-09-06 NOTE — Assessment & Plan Note (Signed)

## 2018-09-06 NOTE — Progress Notes (Signed)
Subjective:    Patient ID: Christopher Lopez, male    DOB: [birth year], 59 y.o.   MRN: [id number]  HPI  Here for wellness and f/u;  Overall doing ok;  Pt denies Chest pain, worsening SOB, DOE, wheezing, orthopnea, PND, worsening LE edema, palpitations, dizziness or syncope.  Pt denies neurological change such as new headache, facial or extremity weakness.  Pt denies polydipsia, polyuria, or low sugar symptoms. Pt states overall good compliance with treatment and medications, good tolerability, and has been trying to follow appropriate diet.  Pt denies worsening depressive symptoms, suicidal ideation or panic. No fever, night sweats, wt loss, loss of appetite, or other constitutional symptoms.  Pt states good ability with ADL's, has low fall risk, home safety reviewed and adequate, no other significant changes in hearing or vision, and only occasionally active with exercise. No new complaints. Sees podiatry every 3 mo.  Conts HD tue-thur-sat.   Past Medical History:  Diagnosis Date  . Alcohol abuse 09/18/2011  . Allergic rhinitis, cause unspecified 09/20/2011  . Anemia, unspecified 09/18/2011  . Arthritis   . Childhood asthma 09/20/2011  . CKD (chronic kidney disease) stage 5, GFR less than 15 ml/min (HCC)    Dialysis - T/Th/Sa  . Colitis 09/18/2011  . Dementia ([place]) 09/18/2011  . Diabetes mellitus   . Diverticulosis of colon without hemorrhage 02/27/2014  . Foot ulcer ([place]) 09/18/2011  . High cholesterol   . HTN (hypertension) 09/18/2011  . Hyperlipidemia 11/08/2011  . Hypertension   . Impaired glucose tolerance 09/18/2011  . Pneumonia 09/18/2011  . Pre-ulcerative corn or callous 11/20/2011  . Stroke [facility])    left leg deficit  . Type II or unspecified type diabetes mellitus without mention of complication, uncontrolled 09/20/2011  . Wears glasses    Past Surgical History:  Procedure Laterality Date  . AV FISTULA PLACEMENT Left 12/23/2017   Procedure: CREATION OF BASILIAC - CEPHALIC  ARTERIOVENOUS (AV)  FISTULA  STAGE ONE;  Surgeon: Waynetta Sandy, MD;  Location: [place];  Service: Vascular;  Laterality: Left;  . AV FISTULA PLACEMENT Left 07/26/2018   Procedure: INSERTION OF ARTERIOVENOUS (AV) GORE-TEX GRAFT ARM;  Surgeon: Waynetta Sandy, MD;  Location: [place];  Service: Vascular;  Laterality: Left;  . BASCILIC VEIN TRANSPOSITION Left 03/01/2018   Procedure: BASILIC VEIN TRANSPOSITION SECOND STAGE;  Surgeon: Waynetta Sandy, MD;  Location: [place];  Service: Vascular;  Laterality: Left;  . INSERTION OF DIALYSIS CATHETER Right 12/23/2017   Procedure: INSERTION OF PALINDROME  DIALYSIS CATHETER;  Surgeon: Waynetta Sandy, MD;  Location: [place];  Service: Vascular;  Laterality: Right;  . THROMBECTOMY BRACHIAL ARTERY Left 07/26/2018   Procedure: THROMBECTOMY LEFT UPPER EXTREMITY;  Surgeon: Waynetta Sandy, MD;  Location: [place];  Service: Vascular;  Laterality: Left;  . TONSILLECTOMY  1985  . VEIN REPAIR Left 07/26/2018   Procedure: STENT LEFT AXILLARY VEIN;  Surgeon: Waynetta Sandy, MD;  Location: [place];  Service: Vascular;  Laterality: Left;    reports that he has quit smoking. His smoking use included cigarettes. He smoked 0.50 packs per day. He has never used smokeless tobacco. He reports previous alcohol use of about 2.0 standard drinks of alcohol per week. He reports that he does not use drugs. family history includes Diabetes in his maternal grandfather, mother, and sister; Heart disease in his mother; Prostate cancer in his paternal grandfather; Transient ischemic attack in his mother. No Known Allergies Current Outpatient Medications on File Prior to  Visit  Medication Sig Dispense Refill  . acetaminophen (TYLENOL) 650 MG CR tablet Take 650 mg by mouth daily.    Marland Kitchen aspirin EC 81 MG tablet Take 1 tablet (81 mg total) by mouth daily. 90 tablet 11  . diltiazem (CARDIZEM LA) 360 MG 24 hr tablet Take 360 mg by mouth daily.  6  . Dulaglutide  (TRULICITY) 1.5 PJ/8.2NK SOPN Inject 0.5 mLs into the skin once a week. (Patient taking differently: Inject 1.5 mg into the skin every Sunday. ) 6 mL 3  . glucose blood (ONE TOUCH ULTRA TEST) test strip Use as instructed once daily E11.9 100 each 12  . hydrALAZINE (APRESOLINE) 100 MG tablet Take 100 mg by mouth 3 (three) times daily.  3  . Lancets (ONETOUCH ULTRASOFT) lancets 1 each by Other route 2 (two) times daily. Use to check blood sugars once a day Dx e11.9 100 each 3  . lovastatin (MEVACOR) 40 MG tablet TAKE 1 TABLET BY MOUTH  DAILY. (Patient taking differently: Take 40 mg by mouth every evening. ) 90 tablet 2  . metoprolol succinate (TOPROL-XL) 50 MG 24 hr tablet TAKE 1 TABLET BY MOUTH  DAILY WITH OR IMMEDIATLEY  FOLLOWING A MEAL (Patient taking differently: Take 50 mg by mouth daily. ) 90 tablet 1  . Multiple Vitamin (MULTIVITAMIN) tablet Take 1 tablet by mouth daily.    Marland Kitchen oxyCODONE-acetaminophen (PERCOCET) 5-325 MG tablet Take 1 tablet by mouth every 6 (six) hours as needed for severe pain. 20 tablet 0  . vitamin B-12 (CYANOCOBALAMIN) 1000 MCG tablet Take 1,000 mcg by mouth daily.     No current facility-administered medications on file prior to visit.    Review of Systems Constitutional: Negative for other unusual diaphoresis, sweats, appetite or weight changes HENT: Negative for other worsening hearing loss, ear pain, facial swelling, mouth sores or neck stiffness.   Eyes: Negative for other worsening pain, redness or other visual disturbance.  Respiratory: Negative for other stridor or swelling Cardiovascular: Negative for other palpitations or other chest pain  Gastrointestinal: Negative for worsening diarrhea or loose stools, blood in stool, distention or other pain Genitourinary: Negative for hematuria, flank pain or other change in urine volume.  Musculoskeletal: Negative for myalgias or other joint swelling.  Skin: Negative for other color change, or other wound or worsening  drainage.  Neurological: Negative for other syncope or numbness. Hematological: Negative for other adenopathy or swelling Psychiatric/Behavioral: Negative for hallucinations, other worsening agitation, SI, self-injury, or new decreased concentration All other system neg per pt    Objective:   Physical Exam BP 114/70   Pulse 94   Temp 98 F (36.7 C) (Oral)   Ht 6' (1.829 m)   Wt 161 lb (73 kg)   SpO2 97%   BMI 21.84 kg/m  VS noted,  Constitutional: Pt is oriented to person, place, and time. Appears well-developed and well-nourished, in no significant distress and comfortable Head: Normocephalic and atraumatic  Eyes: Conjunctivae and EOM are normal. Pupils are equal, round, and reactive to light Right Ear: External ear normal without discharge Left Ear: External ear normal without discharge Nose: Nose without discharge or deformity Mouth/Throat: Oropharynx is without other ulcerations and moist  Neck: Normal range of motion. Neck supple. No JVD present. No tracheal deviation present or significant neck LA or mass Cardiovascular: Normal rate, regular rhythm, normal heart sounds and intact distal pulses.   Pulmonary/Chest: WOB normal and breath sounds without rales or wheezing  Abdominal: Soft. Bowel sounds are normal.  NT. No HSM  Musculoskeletal: Normal range of motion. Exhibits no edema Lymphadenopathy: Has no other cervical adenopathy.  Neurological: Pt is alert and oriented to person, place, and time. Pt has normal reflexes. No cranial nerve deficit. Motor grossly intact, Gait intact Skin: Skin is warm and dry. No rash noted or new , + thrill LUE Psychiatric:  Has normal mood and affect. Behavior is normal without agitation No other exam findings Lab Results  Component Value Date   WBC 18.4 (H) 12/25/2017   HGB 12.2 (L) 07/26/2018   HCT 36.0 (L) 07/26/2018   PLT 329 12/25/2017   GLUCOSE 124 (H) 07/26/2018   CHOL 220 (H) 08/11/2017   TRIG 251.0 (H) 08/11/2017   HDL 38.50  (L) 08/11/2017   LDLDIRECT 131.0 08/11/2017   LDLCALC 84 08/05/2016   ALT 6 (L) 12/25/2017   AST 15 12/25/2017   NA 135 07/26/2018   K 4.4 07/26/2018   CL 100 (L) 12/25/2017   CREATININE 7.67 (H) 12/25/2017   BUN 39 (H) 12/25/2017   CO2 23 12/25/2017   TSH 0.73 08/11/2017   PSA 0.55 08/11/2017   HGBA1C 5.3 12/23/2017   MICROALBUR 98.3 (H) 08/11/2017       Assessment & Plan:

## 2018-09-06 NOTE — Patient Instructions (Addendum)
You had the flu shot today, and Prevnar 13 pneumonia shot  Please continue all other medications as before, and refills have been done if requested.  Please have the pharmacy call with any other refills you may need.  Please continue your efforts at being more active, low cholesterol diet, and weight control.  You are otherwise up to date with prevention measures today.  Please keep your appointments with your specialists as you may have planned  Please go to the LAB in the Basement (turn left off the elevator) for the tests to be done today  You will be contacted by phone if any changes need to be made immediately.  Otherwise, you will receive a letter about your results with an explanation, but please check with MyChart first.  Please remember to sign up for MyChart if you have not done so, as this will be important to you in the future with finding out test results, communicating by private email, and scheduling acute appointments online when needed.  Please return in 6 months, or sooner if needed

## 2018-09-06 NOTE — Assessment & Plan Note (Signed)
Urged to quit smoking 

## 2018-09-07 DIAGNOSIS — N2581 Secondary hyperparathyroidism of renal origin: Secondary | ICD-10-CM | POA: Diagnosis not present

## 2018-09-07 DIAGNOSIS — D689 Coagulation defect, unspecified: Secondary | ICD-10-CM | POA: Diagnosis not present

## 2018-09-07 DIAGNOSIS — D631 Anemia in chronic kidney disease: Secondary | ICD-10-CM | POA: Diagnosis not present

## 2018-09-07 DIAGNOSIS — N186 End stage renal disease: Secondary | ICD-10-CM | POA: Diagnosis not present

## 2018-09-07 DIAGNOSIS — Z4901 Encounter for fitting and adjustment of extracorporeal dialysis catheter: Secondary | ICD-10-CM | POA: Diagnosis not present

## 2018-09-07 DIAGNOSIS — D509 Iron deficiency anemia, unspecified: Secondary | ICD-10-CM | POA: Diagnosis not present

## 2018-09-07 LAB — TSH: TSH: 1.32 u[IU]/mL (ref 0.35–4.50)

## 2018-09-09 DIAGNOSIS — E1122 Type 2 diabetes mellitus with diabetic chronic kidney disease: Secondary | ICD-10-CM | POA: Diagnosis not present

## 2018-09-09 DIAGNOSIS — N2581 Secondary hyperparathyroidism of renal origin: Secondary | ICD-10-CM | POA: Diagnosis not present

## 2018-09-09 DIAGNOSIS — D509 Iron deficiency anemia, unspecified: Secondary | ICD-10-CM | POA: Diagnosis not present

## 2018-09-09 DIAGNOSIS — D631 Anemia in chronic kidney disease: Secondary | ICD-10-CM | POA: Diagnosis not present

## 2018-09-09 DIAGNOSIS — Z4901 Encounter for fitting and adjustment of extracorporeal dialysis catheter: Secondary | ICD-10-CM | POA: Diagnosis not present

## 2018-09-09 DIAGNOSIS — D689 Coagulation defect, unspecified: Secondary | ICD-10-CM | POA: Diagnosis not present

## 2018-09-09 DIAGNOSIS — Z992 Dependence on renal dialysis: Secondary | ICD-10-CM | POA: Diagnosis not present

## 2018-09-09 DIAGNOSIS — N186 End stage renal disease: Secondary | ICD-10-CM | POA: Diagnosis not present

## 2018-09-12 DIAGNOSIS — N186 End stage renal disease: Secondary | ICD-10-CM | POA: Diagnosis not present

## 2018-09-12 DIAGNOSIS — N2581 Secondary hyperparathyroidism of renal origin: Secondary | ICD-10-CM | POA: Diagnosis not present

## 2018-09-12 DIAGNOSIS — Z4901 Encounter for fitting and adjustment of extracorporeal dialysis catheter: Secondary | ICD-10-CM | POA: Diagnosis not present

## 2018-09-12 DIAGNOSIS — D689 Coagulation defect, unspecified: Secondary | ICD-10-CM | POA: Diagnosis not present

## 2018-09-14 DIAGNOSIS — N2581 Secondary hyperparathyroidism of renal origin: Secondary | ICD-10-CM | POA: Diagnosis not present

## 2018-09-14 DIAGNOSIS — D689 Coagulation defect, unspecified: Secondary | ICD-10-CM | POA: Diagnosis not present

## 2018-09-14 DIAGNOSIS — Z4901 Encounter for fitting and adjustment of extracorporeal dialysis catheter: Secondary | ICD-10-CM | POA: Diagnosis not present

## 2018-09-14 DIAGNOSIS — N186 End stage renal disease: Secondary | ICD-10-CM | POA: Diagnosis not present

## 2018-09-16 DIAGNOSIS — Z4901 Encounter for fitting and adjustment of extracorporeal dialysis catheter: Secondary | ICD-10-CM | POA: Diagnosis not present

## 2018-09-16 DIAGNOSIS — D689 Coagulation defect, unspecified: Secondary | ICD-10-CM | POA: Diagnosis not present

## 2018-09-16 DIAGNOSIS — N186 End stage renal disease: Secondary | ICD-10-CM | POA: Diagnosis not present

## 2018-09-16 DIAGNOSIS — N2581 Secondary hyperparathyroidism of renal origin: Secondary | ICD-10-CM | POA: Diagnosis not present

## 2018-09-19 DIAGNOSIS — D689 Coagulation defect, unspecified: Secondary | ICD-10-CM | POA: Diagnosis not present

## 2018-09-19 DIAGNOSIS — N186 End stage renal disease: Secondary | ICD-10-CM | POA: Diagnosis not present

## 2018-09-19 DIAGNOSIS — N2581 Secondary hyperparathyroidism of renal origin: Secondary | ICD-10-CM | POA: Diagnosis not present

## 2018-09-19 DIAGNOSIS — Z4901 Encounter for fitting and adjustment of extracorporeal dialysis catheter: Secondary | ICD-10-CM | POA: Diagnosis not present

## 2018-09-20 DIAGNOSIS — Z452 Encounter for adjustment and management of vascular access device: Secondary | ICD-10-CM | POA: Diagnosis not present

## 2018-09-21 DIAGNOSIS — N186 End stage renal disease: Secondary | ICD-10-CM | POA: Diagnosis not present

## 2018-09-21 DIAGNOSIS — D689 Coagulation defect, unspecified: Secondary | ICD-10-CM | POA: Diagnosis not present

## 2018-09-21 DIAGNOSIS — N2581 Secondary hyperparathyroidism of renal origin: Secondary | ICD-10-CM | POA: Diagnosis not present

## 2018-09-21 DIAGNOSIS — Z4901 Encounter for fitting and adjustment of extracorporeal dialysis catheter: Secondary | ICD-10-CM | POA: Diagnosis not present

## 2018-09-23 DIAGNOSIS — D689 Coagulation defect, unspecified: Secondary | ICD-10-CM | POA: Diagnosis not present

## 2018-09-23 DIAGNOSIS — Z4901 Encounter for fitting and adjustment of extracorporeal dialysis catheter: Secondary | ICD-10-CM | POA: Diagnosis not present

## 2018-09-23 DIAGNOSIS — N2581 Secondary hyperparathyroidism of renal origin: Secondary | ICD-10-CM | POA: Diagnosis not present

## 2018-09-23 DIAGNOSIS — N186 End stage renal disease: Secondary | ICD-10-CM | POA: Diagnosis not present

## 2018-09-26 DIAGNOSIS — Z4901 Encounter for fitting and adjustment of extracorporeal dialysis catheter: Secondary | ICD-10-CM | POA: Diagnosis not present

## 2018-09-26 DIAGNOSIS — N2581 Secondary hyperparathyroidism of renal origin: Secondary | ICD-10-CM | POA: Diagnosis not present

## 2018-09-26 DIAGNOSIS — N186 End stage renal disease: Secondary | ICD-10-CM | POA: Diagnosis not present

## 2018-09-26 DIAGNOSIS — D689 Coagulation defect, unspecified: Secondary | ICD-10-CM | POA: Diagnosis not present

## 2018-09-28 DIAGNOSIS — D689 Coagulation defect, unspecified: Secondary | ICD-10-CM | POA: Diagnosis not present

## 2018-09-28 DIAGNOSIS — N2581 Secondary hyperparathyroidism of renal origin: Secondary | ICD-10-CM | POA: Diagnosis not present

## 2018-09-28 DIAGNOSIS — N186 End stage renal disease: Secondary | ICD-10-CM | POA: Diagnosis not present

## 2018-09-28 DIAGNOSIS — Z4901 Encounter for fitting and adjustment of extracorporeal dialysis catheter: Secondary | ICD-10-CM | POA: Diagnosis not present

## 2018-09-30 DIAGNOSIS — N2581 Secondary hyperparathyroidism of renal origin: Secondary | ICD-10-CM | POA: Diagnosis not present

## 2018-09-30 DIAGNOSIS — N186 End stage renal disease: Secondary | ICD-10-CM | POA: Diagnosis not present

## 2018-09-30 DIAGNOSIS — D689 Coagulation defect, unspecified: Secondary | ICD-10-CM | POA: Diagnosis not present

## 2018-09-30 DIAGNOSIS — Z4901 Encounter for fitting and adjustment of extracorporeal dialysis catheter: Secondary | ICD-10-CM | POA: Diagnosis not present

## 2018-10-03 DIAGNOSIS — N2581 Secondary hyperparathyroidism of renal origin: Secondary | ICD-10-CM | POA: Diagnosis not present

## 2018-10-03 DIAGNOSIS — D689 Coagulation defect, unspecified: Secondary | ICD-10-CM | POA: Diagnosis not present

## 2018-10-03 DIAGNOSIS — Z4901 Encounter for fitting and adjustment of extracorporeal dialysis catheter: Secondary | ICD-10-CM | POA: Diagnosis not present

## 2018-10-03 DIAGNOSIS — N186 End stage renal disease: Secondary | ICD-10-CM | POA: Diagnosis not present

## 2018-10-05 DIAGNOSIS — D689 Coagulation defect, unspecified: Secondary | ICD-10-CM | POA: Diagnosis not present

## 2018-10-05 DIAGNOSIS — N186 End stage renal disease: Secondary | ICD-10-CM | POA: Diagnosis not present

## 2018-10-05 DIAGNOSIS — N2581 Secondary hyperparathyroidism of renal origin: Secondary | ICD-10-CM | POA: Diagnosis not present

## 2018-10-05 DIAGNOSIS — Z4901 Encounter for fitting and adjustment of extracorporeal dialysis catheter: Secondary | ICD-10-CM | POA: Diagnosis not present

## 2018-10-07 DIAGNOSIS — D689 Coagulation defect, unspecified: Secondary | ICD-10-CM | POA: Diagnosis not present

## 2018-10-07 DIAGNOSIS — Z4901 Encounter for fitting and adjustment of extracorporeal dialysis catheter: Secondary | ICD-10-CM | POA: Diagnosis not present

## 2018-10-07 DIAGNOSIS — N186 End stage renal disease: Secondary | ICD-10-CM | POA: Diagnosis not present

## 2018-10-07 DIAGNOSIS — N2581 Secondary hyperparathyroidism of renal origin: Secondary | ICD-10-CM | POA: Diagnosis not present

## 2018-10-10 DIAGNOSIS — N2581 Secondary hyperparathyroidism of renal origin: Secondary | ICD-10-CM | POA: Diagnosis not present

## 2018-10-10 DIAGNOSIS — Z4901 Encounter for fitting and adjustment of extracorporeal dialysis catheter: Secondary | ICD-10-CM | POA: Diagnosis not present

## 2018-10-10 DIAGNOSIS — Z992 Dependence on renal dialysis: Secondary | ICD-10-CM | POA: Diagnosis not present

## 2018-10-10 DIAGNOSIS — D689 Coagulation defect, unspecified: Secondary | ICD-10-CM | POA: Diagnosis not present

## 2018-10-10 DIAGNOSIS — N186 End stage renal disease: Secondary | ICD-10-CM | POA: Diagnosis not present

## 2018-10-10 DIAGNOSIS — E1122 Type 2 diabetes mellitus with diabetic chronic kidney disease: Secondary | ICD-10-CM | POA: Diagnosis not present

## 2018-10-12 DIAGNOSIS — N186 End stage renal disease: Secondary | ICD-10-CM | POA: Diagnosis not present

## 2018-10-12 DIAGNOSIS — D689 Coagulation defect, unspecified: Secondary | ICD-10-CM | POA: Diagnosis not present

## 2018-10-12 DIAGNOSIS — E1129 Type 2 diabetes mellitus with other diabetic kidney complication: Secondary | ICD-10-CM | POA: Diagnosis not present

## 2018-10-12 DIAGNOSIS — Z23 Encounter for immunization: Secondary | ICD-10-CM | POA: Diagnosis not present

## 2018-10-12 DIAGNOSIS — N2581 Secondary hyperparathyroidism of renal origin: Secondary | ICD-10-CM | POA: Diagnosis not present

## 2018-10-14 DIAGNOSIS — N2581 Secondary hyperparathyroidism of renal origin: Secondary | ICD-10-CM | POA: Diagnosis not present

## 2018-10-14 DIAGNOSIS — N186 End stage renal disease: Secondary | ICD-10-CM | POA: Diagnosis not present

## 2018-10-14 DIAGNOSIS — E1129 Type 2 diabetes mellitus with other diabetic kidney complication: Secondary | ICD-10-CM | POA: Diagnosis not present

## 2018-10-14 DIAGNOSIS — D689 Coagulation defect, unspecified: Secondary | ICD-10-CM | POA: Diagnosis not present

## 2018-10-14 DIAGNOSIS — Z23 Encounter for immunization: Secondary | ICD-10-CM | POA: Diagnosis not present

## 2018-10-17 DIAGNOSIS — N2581 Secondary hyperparathyroidism of renal origin: Secondary | ICD-10-CM | POA: Diagnosis not present

## 2018-10-17 DIAGNOSIS — N186 End stage renal disease: Secondary | ICD-10-CM | POA: Diagnosis not present

## 2018-10-17 DIAGNOSIS — E1129 Type 2 diabetes mellitus with other diabetic kidney complication: Secondary | ICD-10-CM | POA: Diagnosis not present

## 2018-10-17 DIAGNOSIS — Z23 Encounter for immunization: Secondary | ICD-10-CM | POA: Diagnosis not present

## 2018-10-17 DIAGNOSIS — D689 Coagulation defect, unspecified: Secondary | ICD-10-CM | POA: Diagnosis not present

## 2018-10-18 ENCOUNTER — Ambulatory Visit: Payer: Medicare Other | Admitting: Podiatry

## 2018-10-19 DIAGNOSIS — N2581 Secondary hyperparathyroidism of renal origin: Secondary | ICD-10-CM | POA: Diagnosis not present

## 2018-10-19 DIAGNOSIS — E1129 Type 2 diabetes mellitus with other diabetic kidney complication: Secondary | ICD-10-CM | POA: Diagnosis not present

## 2018-10-19 DIAGNOSIS — N186 End stage renal disease: Secondary | ICD-10-CM | POA: Diagnosis not present

## 2018-10-19 DIAGNOSIS — Z23 Encounter for immunization: Secondary | ICD-10-CM | POA: Diagnosis not present

## 2018-10-19 DIAGNOSIS — D689 Coagulation defect, unspecified: Secondary | ICD-10-CM | POA: Diagnosis not present

## 2018-10-21 DIAGNOSIS — N2581 Secondary hyperparathyroidism of renal origin: Secondary | ICD-10-CM | POA: Diagnosis not present

## 2018-10-21 DIAGNOSIS — D689 Coagulation defect, unspecified: Secondary | ICD-10-CM | POA: Diagnosis not present

## 2018-10-21 DIAGNOSIS — N186 End stage renal disease: Secondary | ICD-10-CM | POA: Diagnosis not present

## 2018-10-21 DIAGNOSIS — E1129 Type 2 diabetes mellitus with other diabetic kidney complication: Secondary | ICD-10-CM | POA: Diagnosis not present

## 2018-10-21 DIAGNOSIS — Z23 Encounter for immunization: Secondary | ICD-10-CM | POA: Diagnosis not present

## 2018-10-24 DIAGNOSIS — N2581 Secondary hyperparathyroidism of renal origin: Secondary | ICD-10-CM | POA: Diagnosis not present

## 2018-10-24 DIAGNOSIS — D689 Coagulation defect, unspecified: Secondary | ICD-10-CM | POA: Diagnosis not present

## 2018-10-24 DIAGNOSIS — E1129 Type 2 diabetes mellitus with other diabetic kidney complication: Secondary | ICD-10-CM | POA: Diagnosis not present

## 2018-10-24 DIAGNOSIS — Z23 Encounter for immunization: Secondary | ICD-10-CM | POA: Diagnosis not present

## 2018-10-24 DIAGNOSIS — N186 End stage renal disease: Secondary | ICD-10-CM | POA: Diagnosis not present

## 2018-10-26 DIAGNOSIS — N186 End stage renal disease: Secondary | ICD-10-CM | POA: Diagnosis not present

## 2018-10-26 DIAGNOSIS — E1129 Type 2 diabetes mellitus with other diabetic kidney complication: Secondary | ICD-10-CM | POA: Diagnosis not present

## 2018-10-26 DIAGNOSIS — N2581 Secondary hyperparathyroidism of renal origin: Secondary | ICD-10-CM | POA: Diagnosis not present

## 2018-10-26 DIAGNOSIS — D689 Coagulation defect, unspecified: Secondary | ICD-10-CM | POA: Diagnosis not present

## 2018-10-26 DIAGNOSIS — Z23 Encounter for immunization: Secondary | ICD-10-CM | POA: Diagnosis not present

## 2018-10-28 DIAGNOSIS — D689 Coagulation defect, unspecified: Secondary | ICD-10-CM | POA: Diagnosis not present

## 2018-10-28 DIAGNOSIS — E1129 Type 2 diabetes mellitus with other diabetic kidney complication: Secondary | ICD-10-CM | POA: Diagnosis not present

## 2018-10-28 DIAGNOSIS — N2581 Secondary hyperparathyroidism of renal origin: Secondary | ICD-10-CM | POA: Diagnosis not present

## 2018-10-28 DIAGNOSIS — Z23 Encounter for immunization: Secondary | ICD-10-CM | POA: Diagnosis not present

## 2018-10-28 DIAGNOSIS — N186 End stage renal disease: Secondary | ICD-10-CM | POA: Diagnosis not present

## 2018-10-31 DIAGNOSIS — E1129 Type 2 diabetes mellitus with other diabetic kidney complication: Secondary | ICD-10-CM | POA: Diagnosis not present

## 2018-10-31 DIAGNOSIS — D689 Coagulation defect, unspecified: Secondary | ICD-10-CM | POA: Diagnosis not present

## 2018-10-31 DIAGNOSIS — N186 End stage renal disease: Secondary | ICD-10-CM | POA: Diagnosis not present

## 2018-10-31 DIAGNOSIS — Z23 Encounter for immunization: Secondary | ICD-10-CM | POA: Diagnosis not present

## 2018-10-31 DIAGNOSIS — N2581 Secondary hyperparathyroidism of renal origin: Secondary | ICD-10-CM | POA: Diagnosis not present

## 2018-11-02 DIAGNOSIS — N186 End stage renal disease: Secondary | ICD-10-CM | POA: Diagnosis not present

## 2018-11-02 DIAGNOSIS — Z23 Encounter for immunization: Secondary | ICD-10-CM | POA: Diagnosis not present

## 2018-11-02 DIAGNOSIS — E1129 Type 2 diabetes mellitus with other diabetic kidney complication: Secondary | ICD-10-CM | POA: Diagnosis not present

## 2018-11-02 DIAGNOSIS — N2581 Secondary hyperparathyroidism of renal origin: Secondary | ICD-10-CM | POA: Diagnosis not present

## 2018-11-02 DIAGNOSIS — D689 Coagulation defect, unspecified: Secondary | ICD-10-CM | POA: Diagnosis not present

## 2018-11-04 DIAGNOSIS — D689 Coagulation defect, unspecified: Secondary | ICD-10-CM | POA: Diagnosis not present

## 2018-11-04 DIAGNOSIS — E1129 Type 2 diabetes mellitus with other diabetic kidney complication: Secondary | ICD-10-CM | POA: Diagnosis not present

## 2018-11-04 DIAGNOSIS — N2581 Secondary hyperparathyroidism of renal origin: Secondary | ICD-10-CM | POA: Diagnosis not present

## 2018-11-04 DIAGNOSIS — N186 End stage renal disease: Secondary | ICD-10-CM | POA: Diagnosis not present

## 2018-11-04 DIAGNOSIS — Z23 Encounter for immunization: Secondary | ICD-10-CM | POA: Diagnosis not present

## 2018-11-07 DIAGNOSIS — E1129 Type 2 diabetes mellitus with other diabetic kidney complication: Secondary | ICD-10-CM | POA: Diagnosis not present

## 2018-11-07 DIAGNOSIS — N186 End stage renal disease: Secondary | ICD-10-CM | POA: Diagnosis not present

## 2018-11-07 DIAGNOSIS — Z23 Encounter for immunization: Secondary | ICD-10-CM | POA: Diagnosis not present

## 2018-11-07 DIAGNOSIS — N2581 Secondary hyperparathyroidism of renal origin: Secondary | ICD-10-CM | POA: Diagnosis not present

## 2018-11-07 DIAGNOSIS — D689 Coagulation defect, unspecified: Secondary | ICD-10-CM | POA: Diagnosis not present

## 2018-11-08 ENCOUNTER — Other Ambulatory Visit: Payer: Self-pay | Admitting: Internal Medicine

## 2018-11-09 DIAGNOSIS — N2581 Secondary hyperparathyroidism of renal origin: Secondary | ICD-10-CM | POA: Diagnosis not present

## 2018-11-09 DIAGNOSIS — E1122 Type 2 diabetes mellitus with diabetic chronic kidney disease: Secondary | ICD-10-CM | POA: Diagnosis not present

## 2018-11-09 DIAGNOSIS — D689 Coagulation defect, unspecified: Secondary | ICD-10-CM | POA: Diagnosis not present

## 2018-11-09 DIAGNOSIS — E1129 Type 2 diabetes mellitus with other diabetic kidney complication: Secondary | ICD-10-CM | POA: Diagnosis not present

## 2018-11-09 DIAGNOSIS — Z23 Encounter for immunization: Secondary | ICD-10-CM | POA: Diagnosis not present

## 2018-11-09 DIAGNOSIS — N186 End stage renal disease: Secondary | ICD-10-CM | POA: Diagnosis not present

## 2018-11-09 DIAGNOSIS — Z992 Dependence on renal dialysis: Secondary | ICD-10-CM | POA: Diagnosis not present

## 2018-11-11 DIAGNOSIS — L299 Pruritus, unspecified: Secondary | ICD-10-CM | POA: Diagnosis not present

## 2018-11-11 DIAGNOSIS — E1129 Type 2 diabetes mellitus with other diabetic kidney complication: Secondary | ICD-10-CM | POA: Diagnosis not present

## 2018-11-11 DIAGNOSIS — N2581 Secondary hyperparathyroidism of renal origin: Secondary | ICD-10-CM | POA: Diagnosis not present

## 2018-11-11 DIAGNOSIS — D689 Coagulation defect, unspecified: Secondary | ICD-10-CM | POA: Diagnosis not present

## 2018-11-11 DIAGNOSIS — N186 End stage renal disease: Secondary | ICD-10-CM | POA: Diagnosis not present

## 2018-11-14 DIAGNOSIS — N2581 Secondary hyperparathyroidism of renal origin: Secondary | ICD-10-CM | POA: Diagnosis not present

## 2018-11-14 DIAGNOSIS — L299 Pruritus, unspecified: Secondary | ICD-10-CM | POA: Diagnosis not present

## 2018-11-14 DIAGNOSIS — N186 End stage renal disease: Secondary | ICD-10-CM | POA: Diagnosis not present

## 2018-11-14 DIAGNOSIS — D689 Coagulation defect, unspecified: Secondary | ICD-10-CM | POA: Diagnosis not present

## 2018-11-14 DIAGNOSIS — E1129 Type 2 diabetes mellitus with other diabetic kidney complication: Secondary | ICD-10-CM | POA: Diagnosis not present

## 2018-11-16 DIAGNOSIS — E1129 Type 2 diabetes mellitus with other diabetic kidney complication: Secondary | ICD-10-CM | POA: Diagnosis not present

## 2018-11-16 DIAGNOSIS — L299 Pruritus, unspecified: Secondary | ICD-10-CM | POA: Diagnosis not present

## 2018-11-16 DIAGNOSIS — N2581 Secondary hyperparathyroidism of renal origin: Secondary | ICD-10-CM | POA: Diagnosis not present

## 2018-11-16 DIAGNOSIS — N186 End stage renal disease: Secondary | ICD-10-CM | POA: Diagnosis not present

## 2018-11-16 DIAGNOSIS — D689 Coagulation defect, unspecified: Secondary | ICD-10-CM | POA: Diagnosis not present

## 2018-11-18 DIAGNOSIS — N186 End stage renal disease: Secondary | ICD-10-CM | POA: Diagnosis not present

## 2018-11-18 DIAGNOSIS — N2581 Secondary hyperparathyroidism of renal origin: Secondary | ICD-10-CM | POA: Diagnosis not present

## 2018-11-18 DIAGNOSIS — E1129 Type 2 diabetes mellitus with other diabetic kidney complication: Secondary | ICD-10-CM | POA: Diagnosis not present

## 2018-11-18 DIAGNOSIS — L299 Pruritus, unspecified: Secondary | ICD-10-CM | POA: Insufficient documentation

## 2018-11-18 DIAGNOSIS — D689 Coagulation defect, unspecified: Secondary | ICD-10-CM | POA: Diagnosis not present

## 2018-11-21 DIAGNOSIS — N2581 Secondary hyperparathyroidism of renal origin: Secondary | ICD-10-CM | POA: Diagnosis not present

## 2018-11-21 DIAGNOSIS — D689 Coagulation defect, unspecified: Secondary | ICD-10-CM | POA: Diagnosis not present

## 2018-11-21 DIAGNOSIS — E1129 Type 2 diabetes mellitus with other diabetic kidney complication: Secondary | ICD-10-CM | POA: Diagnosis not present

## 2018-11-21 DIAGNOSIS — N186 End stage renal disease: Secondary | ICD-10-CM | POA: Diagnosis not present

## 2018-11-21 DIAGNOSIS — L299 Pruritus, unspecified: Secondary | ICD-10-CM | POA: Diagnosis not present

## 2018-11-23 DIAGNOSIS — E1129 Type 2 diabetes mellitus with other diabetic kidney complication: Secondary | ICD-10-CM | POA: Diagnosis not present

## 2018-11-23 DIAGNOSIS — N2581 Secondary hyperparathyroidism of renal origin: Secondary | ICD-10-CM | POA: Diagnosis not present

## 2018-11-23 DIAGNOSIS — N186 End stage renal disease: Secondary | ICD-10-CM | POA: Diagnosis not present

## 2018-11-23 DIAGNOSIS — L299 Pruritus, unspecified: Secondary | ICD-10-CM | POA: Diagnosis not present

## 2018-11-23 DIAGNOSIS — D689 Coagulation defect, unspecified: Secondary | ICD-10-CM | POA: Diagnosis not present

## 2018-11-25 DIAGNOSIS — N2581 Secondary hyperparathyroidism of renal origin: Secondary | ICD-10-CM | POA: Diagnosis not present

## 2018-11-25 DIAGNOSIS — D689 Coagulation defect, unspecified: Secondary | ICD-10-CM | POA: Diagnosis not present

## 2018-11-25 DIAGNOSIS — E1129 Type 2 diabetes mellitus with other diabetic kidney complication: Secondary | ICD-10-CM | POA: Diagnosis not present

## 2018-11-25 DIAGNOSIS — N186 End stage renal disease: Secondary | ICD-10-CM | POA: Diagnosis not present

## 2018-11-25 DIAGNOSIS — L299 Pruritus, unspecified: Secondary | ICD-10-CM | POA: Diagnosis not present

## 2018-11-28 DIAGNOSIS — D689 Coagulation defect, unspecified: Secondary | ICD-10-CM | POA: Diagnosis not present

## 2018-11-28 DIAGNOSIS — L299 Pruritus, unspecified: Secondary | ICD-10-CM | POA: Diagnosis not present

## 2018-11-28 DIAGNOSIS — E1129 Type 2 diabetes mellitus with other diabetic kidney complication: Secondary | ICD-10-CM | POA: Diagnosis not present

## 2018-11-28 DIAGNOSIS — N186 End stage renal disease: Secondary | ICD-10-CM | POA: Diagnosis not present

## 2018-11-28 DIAGNOSIS — N2581 Secondary hyperparathyroidism of renal origin: Secondary | ICD-10-CM | POA: Diagnosis not present

## 2018-11-29 ENCOUNTER — Telehealth: Payer: Self-pay

## 2018-11-29 MED ORDER — PIOGLITAZONE HCL 15 MG PO TABS: 15.0000 mg | ORAL_TABLET | Freq: Every day | ORAL | 3 refills | Status: DC

## 2018-11-29 NOTE — Addendum Note (Signed)
Addended by: Biagio Borg on: 11/29/2018 01:22 PM   Modules accepted: Orders

## 2018-11-29 NOTE — Telephone Encounter (Signed)
Ok to change to actos 15 mg - done erx

## 2018-11-29 NOTE — Telephone Encounter (Signed)
Pt's mother, Delshon Blanchfield, has been informed.

## 2018-11-29 NOTE — Telephone Encounter (Signed)
Copied from [place] [phone]. Topic: General - Other >> Nov 29, 2018 11:04 AM Yvette Rack wrote: Reason for CRM: Pt mother called in stating they have hit the donut hole with the sitaGLIPtin (JANUVIA) 100 MG tablet and she would like to know if an alternate medication can be prescribed. Pt mother requests a call back.

## 2018-11-30 DIAGNOSIS — N186 End stage renal disease: Secondary | ICD-10-CM | POA: Diagnosis not present

## 2018-11-30 DIAGNOSIS — D689 Coagulation defect, unspecified: Secondary | ICD-10-CM | POA: Diagnosis not present

## 2018-11-30 DIAGNOSIS — E1129 Type 2 diabetes mellitus with other diabetic kidney complication: Secondary | ICD-10-CM | POA: Diagnosis not present

## 2018-11-30 DIAGNOSIS — N2581 Secondary hyperparathyroidism of renal origin: Secondary | ICD-10-CM | POA: Diagnosis not present

## 2018-11-30 DIAGNOSIS — L299 Pruritus, unspecified: Secondary | ICD-10-CM | POA: Diagnosis not present

## 2018-12-02 DIAGNOSIS — D689 Coagulation defect, unspecified: Secondary | ICD-10-CM | POA: Diagnosis not present

## 2018-12-02 DIAGNOSIS — E1129 Type 2 diabetes mellitus with other diabetic kidney complication: Secondary | ICD-10-CM | POA: Diagnosis not present

## 2018-12-02 DIAGNOSIS — N186 End stage renal disease: Secondary | ICD-10-CM | POA: Diagnosis not present

## 2018-12-02 DIAGNOSIS — L299 Pruritus, unspecified: Secondary | ICD-10-CM | POA: Diagnosis not present

## 2018-12-02 DIAGNOSIS — N2581 Secondary hyperparathyroidism of renal origin: Secondary | ICD-10-CM | POA: Diagnosis not present

## 2018-12-05 DIAGNOSIS — D689 Coagulation defect, unspecified: Secondary | ICD-10-CM | POA: Diagnosis not present

## 2018-12-05 DIAGNOSIS — N186 End stage renal disease: Secondary | ICD-10-CM | POA: Diagnosis not present

## 2018-12-05 DIAGNOSIS — E1129 Type 2 diabetes mellitus with other diabetic kidney complication: Secondary | ICD-10-CM | POA: Diagnosis not present

## 2018-12-05 DIAGNOSIS — L299 Pruritus, unspecified: Secondary | ICD-10-CM | POA: Diagnosis not present

## 2018-12-05 DIAGNOSIS — N2581 Secondary hyperparathyroidism of renal origin: Secondary | ICD-10-CM | POA: Diagnosis not present

## 2018-12-07 DIAGNOSIS — N2581 Secondary hyperparathyroidism of renal origin: Secondary | ICD-10-CM | POA: Diagnosis not present

## 2018-12-07 DIAGNOSIS — D689 Coagulation defect, unspecified: Secondary | ICD-10-CM | POA: Diagnosis not present

## 2018-12-07 DIAGNOSIS — N186 End stage renal disease: Secondary | ICD-10-CM | POA: Diagnosis not present

## 2018-12-07 DIAGNOSIS — L299 Pruritus, unspecified: Secondary | ICD-10-CM | POA: Diagnosis not present

## 2018-12-07 DIAGNOSIS — E1129 Type 2 diabetes mellitus with other diabetic kidney complication: Secondary | ICD-10-CM | POA: Diagnosis not present

## 2018-12-09 DIAGNOSIS — N2581 Secondary hyperparathyroidism of renal origin: Secondary | ICD-10-CM | POA: Diagnosis not present

## 2018-12-09 DIAGNOSIS — D689 Coagulation defect, unspecified: Secondary | ICD-10-CM | POA: Diagnosis not present

## 2018-12-09 DIAGNOSIS — N186 End stage renal disease: Secondary | ICD-10-CM | POA: Diagnosis not present

## 2018-12-09 DIAGNOSIS — L299 Pruritus, unspecified: Secondary | ICD-10-CM | POA: Diagnosis not present

## 2018-12-09 DIAGNOSIS — E1129 Type 2 diabetes mellitus with other diabetic kidney complication: Secondary | ICD-10-CM | POA: Diagnosis not present

## 2018-12-10 DIAGNOSIS — Z992 Dependence on renal dialysis: Secondary | ICD-10-CM | POA: Diagnosis not present

## 2018-12-10 DIAGNOSIS — N186 End stage renal disease: Secondary | ICD-10-CM | POA: Diagnosis not present

## 2018-12-10 DIAGNOSIS — E1122 Type 2 diabetes mellitus with diabetic chronic kidney disease: Secondary | ICD-10-CM | POA: Diagnosis not present

## 2018-12-12 DIAGNOSIS — D689 Coagulation defect, unspecified: Secondary | ICD-10-CM | POA: Diagnosis not present

## 2018-12-12 DIAGNOSIS — D631 Anemia in chronic kidney disease: Secondary | ICD-10-CM | POA: Diagnosis not present

## 2018-12-12 DIAGNOSIS — N186 End stage renal disease: Secondary | ICD-10-CM | POA: Diagnosis not present

## 2018-12-12 DIAGNOSIS — N2581 Secondary hyperparathyroidism of renal origin: Secondary | ICD-10-CM | POA: Diagnosis not present

## 2018-12-12 DIAGNOSIS — E1129 Type 2 diabetes mellitus with other diabetic kidney complication: Secondary | ICD-10-CM | POA: Diagnosis not present

## 2018-12-12 DIAGNOSIS — D509 Iron deficiency anemia, unspecified: Secondary | ICD-10-CM | POA: Diagnosis not present

## 2018-12-14 DIAGNOSIS — D631 Anemia in chronic kidney disease: Secondary | ICD-10-CM | POA: Diagnosis not present

## 2018-12-14 DIAGNOSIS — D689 Coagulation defect, unspecified: Secondary | ICD-10-CM | POA: Diagnosis not present

## 2018-12-14 DIAGNOSIS — D509 Iron deficiency anemia, unspecified: Secondary | ICD-10-CM | POA: Diagnosis not present

## 2018-12-14 DIAGNOSIS — N186 End stage renal disease: Secondary | ICD-10-CM | POA: Diagnosis not present

## 2018-12-14 DIAGNOSIS — N2581 Secondary hyperparathyroidism of renal origin: Secondary | ICD-10-CM | POA: Diagnosis not present

## 2018-12-14 DIAGNOSIS — E1129 Type 2 diabetes mellitus with other diabetic kidney complication: Secondary | ICD-10-CM | POA: Diagnosis not present

## 2018-12-16 DIAGNOSIS — D689 Coagulation defect, unspecified: Secondary | ICD-10-CM | POA: Diagnosis not present

## 2018-12-16 DIAGNOSIS — E1129 Type 2 diabetes mellitus with other diabetic kidney complication: Secondary | ICD-10-CM | POA: Diagnosis not present

## 2018-12-16 DIAGNOSIS — N186 End stage renal disease: Secondary | ICD-10-CM | POA: Diagnosis not present

## 2018-12-16 DIAGNOSIS — D631 Anemia in chronic kidney disease: Secondary | ICD-10-CM | POA: Diagnosis not present

## 2018-12-16 DIAGNOSIS — D509 Iron deficiency anemia, unspecified: Secondary | ICD-10-CM | POA: Diagnosis not present

## 2018-12-16 DIAGNOSIS — N2581 Secondary hyperparathyroidism of renal origin: Secondary | ICD-10-CM | POA: Diagnosis not present

## 2018-12-19 DIAGNOSIS — N186 End stage renal disease: Secondary | ICD-10-CM | POA: Diagnosis not present

## 2018-12-19 DIAGNOSIS — D689 Coagulation defect, unspecified: Secondary | ICD-10-CM | POA: Diagnosis not present

## 2018-12-19 DIAGNOSIS — E1129 Type 2 diabetes mellitus with other diabetic kidney complication: Secondary | ICD-10-CM | POA: Diagnosis not present

## 2018-12-19 DIAGNOSIS — N2581 Secondary hyperparathyroidism of renal origin: Secondary | ICD-10-CM | POA: Diagnosis not present

## 2018-12-19 DIAGNOSIS — D509 Iron deficiency anemia, unspecified: Secondary | ICD-10-CM | POA: Diagnosis not present

## 2018-12-19 DIAGNOSIS — D631 Anemia in chronic kidney disease: Secondary | ICD-10-CM | POA: Diagnosis not present

## 2018-12-21 DIAGNOSIS — D509 Iron deficiency anemia, unspecified: Secondary | ICD-10-CM | POA: Diagnosis not present

## 2018-12-21 DIAGNOSIS — N186 End stage renal disease: Secondary | ICD-10-CM | POA: Diagnosis not present

## 2018-12-21 DIAGNOSIS — N2581 Secondary hyperparathyroidism of renal origin: Secondary | ICD-10-CM | POA: Diagnosis not present

## 2018-12-21 DIAGNOSIS — E1129 Type 2 diabetes mellitus with other diabetic kidney complication: Secondary | ICD-10-CM | POA: Diagnosis not present

## 2018-12-21 DIAGNOSIS — D631 Anemia in chronic kidney disease: Secondary | ICD-10-CM | POA: Diagnosis not present

## 2018-12-21 DIAGNOSIS — D689 Coagulation defect, unspecified: Secondary | ICD-10-CM | POA: Diagnosis not present

## 2018-12-23 DIAGNOSIS — N186 End stage renal disease: Secondary | ICD-10-CM | POA: Diagnosis not present

## 2018-12-23 DIAGNOSIS — D509 Iron deficiency anemia, unspecified: Secondary | ICD-10-CM | POA: Diagnosis not present

## 2018-12-23 DIAGNOSIS — N2581 Secondary hyperparathyroidism of renal origin: Secondary | ICD-10-CM | POA: Diagnosis not present

## 2018-12-23 DIAGNOSIS — D689 Coagulation defect, unspecified: Secondary | ICD-10-CM | POA: Diagnosis not present

## 2018-12-23 DIAGNOSIS — E1129 Type 2 diabetes mellitus with other diabetic kidney complication: Secondary | ICD-10-CM | POA: Diagnosis not present

## 2018-12-23 DIAGNOSIS — D631 Anemia in chronic kidney disease: Secondary | ICD-10-CM | POA: Diagnosis not present

## 2018-12-26 DIAGNOSIS — N186 End stage renal disease: Secondary | ICD-10-CM | POA: Diagnosis not present

## 2018-12-26 DIAGNOSIS — N2581 Secondary hyperparathyroidism of renal origin: Secondary | ICD-10-CM | POA: Diagnosis not present

## 2018-12-26 DIAGNOSIS — D631 Anemia in chronic kidney disease: Secondary | ICD-10-CM | POA: Diagnosis not present

## 2018-12-26 DIAGNOSIS — D509 Iron deficiency anemia, unspecified: Secondary | ICD-10-CM | POA: Diagnosis not present

## 2018-12-26 DIAGNOSIS — D689 Coagulation defect, unspecified: Secondary | ICD-10-CM | POA: Diagnosis not present

## 2018-12-26 DIAGNOSIS — E1129 Type 2 diabetes mellitus with other diabetic kidney complication: Secondary | ICD-10-CM | POA: Diagnosis not present

## 2018-12-28 DIAGNOSIS — D689 Coagulation defect, unspecified: Secondary | ICD-10-CM | POA: Diagnosis not present

## 2018-12-28 DIAGNOSIS — D631 Anemia in chronic kidney disease: Secondary | ICD-10-CM | POA: Diagnosis not present

## 2018-12-28 DIAGNOSIS — N2581 Secondary hyperparathyroidism of renal origin: Secondary | ICD-10-CM | POA: Diagnosis not present

## 2018-12-28 DIAGNOSIS — N186 End stage renal disease: Secondary | ICD-10-CM | POA: Diagnosis not present

## 2018-12-28 DIAGNOSIS — E1129 Type 2 diabetes mellitus with other diabetic kidney complication: Secondary | ICD-10-CM | POA: Diagnosis not present

## 2018-12-28 DIAGNOSIS — D509 Iron deficiency anemia, unspecified: Secondary | ICD-10-CM | POA: Diagnosis not present

## 2018-12-30 DIAGNOSIS — N186 End stage renal disease: Secondary | ICD-10-CM | POA: Diagnosis not present

## 2018-12-30 DIAGNOSIS — D509 Iron deficiency anemia, unspecified: Secondary | ICD-10-CM | POA: Diagnosis not present

## 2018-12-30 DIAGNOSIS — D689 Coagulation defect, unspecified: Secondary | ICD-10-CM | POA: Diagnosis not present

## 2018-12-30 DIAGNOSIS — N2581 Secondary hyperparathyroidism of renal origin: Secondary | ICD-10-CM | POA: Diagnosis not present

## 2018-12-30 DIAGNOSIS — E1129 Type 2 diabetes mellitus with other diabetic kidney complication: Secondary | ICD-10-CM | POA: Diagnosis not present

## 2018-12-30 DIAGNOSIS — D631 Anemia in chronic kidney disease: Secondary | ICD-10-CM | POA: Diagnosis not present

## 2019-01-02 DIAGNOSIS — E1129 Type 2 diabetes mellitus with other diabetic kidney complication: Secondary | ICD-10-CM | POA: Diagnosis not present

## 2019-01-02 DIAGNOSIS — N186 End stage renal disease: Secondary | ICD-10-CM | POA: Diagnosis not present

## 2019-01-02 DIAGNOSIS — D689 Coagulation defect, unspecified: Secondary | ICD-10-CM | POA: Diagnosis not present

## 2019-01-02 DIAGNOSIS — N2581 Secondary hyperparathyroidism of renal origin: Secondary | ICD-10-CM | POA: Diagnosis not present

## 2019-01-02 DIAGNOSIS — D631 Anemia in chronic kidney disease: Secondary | ICD-10-CM | POA: Diagnosis not present

## 2019-01-02 DIAGNOSIS — D509 Iron deficiency anemia, unspecified: Secondary | ICD-10-CM | POA: Diagnosis not present

## 2019-01-04 DIAGNOSIS — D509 Iron deficiency anemia, unspecified: Secondary | ICD-10-CM | POA: Diagnosis not present

## 2019-01-04 DIAGNOSIS — N2581 Secondary hyperparathyroidism of renal origin: Secondary | ICD-10-CM | POA: Diagnosis not present

## 2019-01-04 DIAGNOSIS — E1129 Type 2 diabetes mellitus with other diabetic kidney complication: Secondary | ICD-10-CM | POA: Diagnosis not present

## 2019-01-04 DIAGNOSIS — D631 Anemia in chronic kidney disease: Secondary | ICD-10-CM | POA: Diagnosis not present

## 2019-01-04 DIAGNOSIS — D689 Coagulation defect, unspecified: Secondary | ICD-10-CM | POA: Diagnosis not present

## 2019-01-04 DIAGNOSIS — N186 End stage renal disease: Secondary | ICD-10-CM | POA: Diagnosis not present

## 2019-01-06 DIAGNOSIS — D509 Iron deficiency anemia, unspecified: Secondary | ICD-10-CM | POA: Diagnosis not present

## 2019-01-06 DIAGNOSIS — N2581 Secondary hyperparathyroidism of renal origin: Secondary | ICD-10-CM | POA: Diagnosis not present

## 2019-01-06 DIAGNOSIS — N186 End stage renal disease: Secondary | ICD-10-CM | POA: Diagnosis not present

## 2019-01-06 DIAGNOSIS — D689 Coagulation defect, unspecified: Secondary | ICD-10-CM | POA: Diagnosis not present

## 2019-01-06 DIAGNOSIS — D631 Anemia in chronic kidney disease: Secondary | ICD-10-CM | POA: Diagnosis not present

## 2019-01-06 DIAGNOSIS — E1129 Type 2 diabetes mellitus with other diabetic kidney complication: Secondary | ICD-10-CM | POA: Diagnosis not present

## 2019-01-08 DIAGNOSIS — N186 End stage renal disease: Secondary | ICD-10-CM | POA: Diagnosis not present

## 2019-01-08 DIAGNOSIS — T82858A Stenosis of vascular prosthetic devices, implants and grafts, initial encounter: Secondary | ICD-10-CM | POA: Diagnosis not present

## 2019-01-08 DIAGNOSIS — Z992 Dependence on renal dialysis: Secondary | ICD-10-CM | POA: Diagnosis not present

## 2019-01-08 DIAGNOSIS — I871 Compression of vein: Secondary | ICD-10-CM | POA: Diagnosis not present

## 2019-01-09 DIAGNOSIS — E1129 Type 2 diabetes mellitus with other diabetic kidney complication: Secondary | ICD-10-CM | POA: Diagnosis not present

## 2019-01-09 DIAGNOSIS — D689 Coagulation defect, unspecified: Secondary | ICD-10-CM | POA: Diagnosis not present

## 2019-01-09 DIAGNOSIS — N186 End stage renal disease: Secondary | ICD-10-CM | POA: Diagnosis not present

## 2019-01-09 DIAGNOSIS — Z992 Dependence on renal dialysis: Secondary | ICD-10-CM | POA: Diagnosis not present

## 2019-01-09 DIAGNOSIS — Z4802 Encounter for removal of sutures: Secondary | ICD-10-CM | POA: Insufficient documentation

## 2019-01-09 DIAGNOSIS — D509 Iron deficiency anemia, unspecified: Secondary | ICD-10-CM | POA: Diagnosis not present

## 2019-01-09 DIAGNOSIS — N2581 Secondary hyperparathyroidism of renal origin: Secondary | ICD-10-CM | POA: Diagnosis not present

## 2019-01-09 DIAGNOSIS — E1122 Type 2 diabetes mellitus with diabetic chronic kidney disease: Secondary | ICD-10-CM | POA: Diagnosis not present

## 2019-01-09 DIAGNOSIS — D631 Anemia in chronic kidney disease: Secondary | ICD-10-CM | POA: Diagnosis not present

## 2019-01-11 DIAGNOSIS — E1129 Type 2 diabetes mellitus with other diabetic kidney complication: Secondary | ICD-10-CM | POA: Diagnosis not present

## 2019-01-11 DIAGNOSIS — N186 End stage renal disease: Secondary | ICD-10-CM | POA: Diagnosis not present

## 2019-01-11 DIAGNOSIS — N2581 Secondary hyperparathyroidism of renal origin: Secondary | ICD-10-CM | POA: Diagnosis not present

## 2019-01-11 DIAGNOSIS — D631 Anemia in chronic kidney disease: Secondary | ICD-10-CM | POA: Diagnosis not present

## 2019-01-11 DIAGNOSIS — D509 Iron deficiency anemia, unspecified: Secondary | ICD-10-CM | POA: Diagnosis not present

## 2019-01-11 DIAGNOSIS — D689 Coagulation defect, unspecified: Secondary | ICD-10-CM | POA: Diagnosis not present

## 2019-01-13 DIAGNOSIS — E1129 Type 2 diabetes mellitus with other diabetic kidney complication: Secondary | ICD-10-CM | POA: Diagnosis not present

## 2019-01-13 DIAGNOSIS — D509 Iron deficiency anemia, unspecified: Secondary | ICD-10-CM | POA: Diagnosis not present

## 2019-01-13 DIAGNOSIS — D689 Coagulation defect, unspecified: Secondary | ICD-10-CM | POA: Diagnosis not present

## 2019-01-13 DIAGNOSIS — D631 Anemia in chronic kidney disease: Secondary | ICD-10-CM | POA: Diagnosis not present

## 2019-01-13 DIAGNOSIS — N2581 Secondary hyperparathyroidism of renal origin: Secondary | ICD-10-CM | POA: Diagnosis not present

## 2019-01-13 DIAGNOSIS — N186 End stage renal disease: Secondary | ICD-10-CM | POA: Diagnosis not present

## 2019-01-16 DIAGNOSIS — N186 End stage renal disease: Secondary | ICD-10-CM | POA: Diagnosis not present

## 2019-01-16 DIAGNOSIS — D689 Coagulation defect, unspecified: Secondary | ICD-10-CM | POA: Diagnosis not present

## 2019-01-16 DIAGNOSIS — N2581 Secondary hyperparathyroidism of renal origin: Secondary | ICD-10-CM | POA: Diagnosis not present

## 2019-01-16 DIAGNOSIS — D509 Iron deficiency anemia, unspecified: Secondary | ICD-10-CM | POA: Diagnosis not present

## 2019-01-16 DIAGNOSIS — E1129 Type 2 diabetes mellitus with other diabetic kidney complication: Secondary | ICD-10-CM | POA: Diagnosis not present

## 2019-01-16 DIAGNOSIS — D631 Anemia in chronic kidney disease: Secondary | ICD-10-CM | POA: Diagnosis not present

## 2019-01-18 DIAGNOSIS — N2581 Secondary hyperparathyroidism of renal origin: Secondary | ICD-10-CM | POA: Diagnosis not present

## 2019-01-18 DIAGNOSIS — D631 Anemia in chronic kidney disease: Secondary | ICD-10-CM | POA: Diagnosis not present

## 2019-01-18 DIAGNOSIS — D689 Coagulation defect, unspecified: Secondary | ICD-10-CM | POA: Diagnosis not present

## 2019-01-18 DIAGNOSIS — D509 Iron deficiency anemia, unspecified: Secondary | ICD-10-CM | POA: Diagnosis not present

## 2019-01-18 DIAGNOSIS — E1129 Type 2 diabetes mellitus with other diabetic kidney complication: Secondary | ICD-10-CM | POA: Diagnosis not present

## 2019-01-18 DIAGNOSIS — N186 End stage renal disease: Secondary | ICD-10-CM | POA: Diagnosis not present

## 2019-01-20 DIAGNOSIS — D509 Iron deficiency anemia, unspecified: Secondary | ICD-10-CM | POA: Diagnosis not present

## 2019-01-20 DIAGNOSIS — N2581 Secondary hyperparathyroidism of renal origin: Secondary | ICD-10-CM | POA: Diagnosis not present

## 2019-01-20 DIAGNOSIS — D689 Coagulation defect, unspecified: Secondary | ICD-10-CM | POA: Diagnosis not present

## 2019-01-20 DIAGNOSIS — D631 Anemia in chronic kidney disease: Secondary | ICD-10-CM | POA: Diagnosis not present

## 2019-01-20 DIAGNOSIS — N186 End stage renal disease: Secondary | ICD-10-CM | POA: Diagnosis not present

## 2019-01-20 DIAGNOSIS — E1129 Type 2 diabetes mellitus with other diabetic kidney complication: Secondary | ICD-10-CM | POA: Diagnosis not present

## 2019-01-23 DIAGNOSIS — N2581 Secondary hyperparathyroidism of renal origin: Secondary | ICD-10-CM | POA: Diagnosis not present

## 2019-01-23 DIAGNOSIS — D631 Anemia in chronic kidney disease: Secondary | ICD-10-CM | POA: Diagnosis not present

## 2019-01-23 DIAGNOSIS — N186 End stage renal disease: Secondary | ICD-10-CM | POA: Diagnosis not present

## 2019-01-23 DIAGNOSIS — D689 Coagulation defect, unspecified: Secondary | ICD-10-CM | POA: Diagnosis not present

## 2019-01-23 DIAGNOSIS — E1129 Type 2 diabetes mellitus with other diabetic kidney complication: Secondary | ICD-10-CM | POA: Diagnosis not present

## 2019-01-23 DIAGNOSIS — D509 Iron deficiency anemia, unspecified: Secondary | ICD-10-CM | POA: Diagnosis not present

## 2019-01-25 ENCOUNTER — Telehealth: Payer: Self-pay | Admitting: Internal Medicine

## 2019-01-25 DIAGNOSIS — N2581 Secondary hyperparathyroidism of renal origin: Secondary | ICD-10-CM | POA: Diagnosis not present

## 2019-01-25 DIAGNOSIS — D631 Anemia in chronic kidney disease: Secondary | ICD-10-CM | POA: Diagnosis not present

## 2019-01-25 DIAGNOSIS — N186 End stage renal disease: Secondary | ICD-10-CM | POA: Diagnosis not present

## 2019-01-25 DIAGNOSIS — D689 Coagulation defect, unspecified: Secondary | ICD-10-CM | POA: Diagnosis not present

## 2019-01-25 DIAGNOSIS — D509 Iron deficiency anemia, unspecified: Secondary | ICD-10-CM | POA: Diagnosis not present

## 2019-01-25 DIAGNOSIS — E1129 Type 2 diabetes mellitus with other diabetic kidney complication: Secondary | ICD-10-CM | POA: Diagnosis not present

## 2019-01-25 NOTE — Telephone Encounter (Signed)
Spoke with Ms. Tetrault (patient's mother) she declined to schedule wellness visit at this time. SF

## 2019-01-26 ENCOUNTER — Ambulatory Visit: Payer: Medicare Other | Admitting: Podiatry

## 2019-01-26 ENCOUNTER — Other Ambulatory Visit: Payer: Self-pay

## 2019-01-26 ENCOUNTER — Encounter: Payer: Self-pay | Admitting: Podiatry

## 2019-01-26 VITALS — Temp 98.0°F

## 2019-01-26 DIAGNOSIS — E1142 Type 2 diabetes mellitus with diabetic polyneuropathy: Secondary | ICD-10-CM | POA: Diagnosis not present

## 2019-01-26 DIAGNOSIS — M79676 Pain in unspecified toe(s): Secondary | ICD-10-CM

## 2019-01-26 DIAGNOSIS — B351 Tinea unguium: Secondary | ICD-10-CM

## 2019-01-26 DIAGNOSIS — Q828 Other specified congenital malformations of skin: Secondary | ICD-10-CM | POA: Diagnosis not present

## 2019-01-26 NOTE — Progress Notes (Signed)
Complaint:  Visit Type: Patient returns to my office for continued preventative foot care services. Complaint: Patient states" my nails have grown long and thick and become painful to walk and wear shoes" Patient has been diagnosed with DM with ESRD. The patient presents for preventative foot care services. No changes to ROS.  Patient has not been seen for over 9 months.  Podiatric Exam: Vascular: dorsalis pedis and posterior tibial pulses are palpable bilateral. Capillary return is immediate. Temperature gradient is WNL. Skin turgor WNL  Sensorium: Diminished.  Semmes Weinstein monofilament test. Normal tactile sensation bilaterally. Nail Exam: Pt has thick disfigured discolored nails with subungual debris noted bilateral entire nail hallux through fifth toenails Ulcer Exam: There is no evidence of ulcer or pre-ulcerative changes or infection. Orthopedic Exam: Muscle tone and strength are WNL. No limitations in general ROM. No crepitus or effusions noted. Foot type and digits show no abnormalities. Bony prominences are unremarkable. Skin:  Porokeratosis sub 4  B/L. No infection or ulcers.  Keratosis  Lichenoides  Chronica  Diagnosis:  Onychomycosis, , Pain in right toe, pain in left toes  Porokeratosis  B/L.  Treatment & Plan Procedures and Treatment: Consent by patient was obtained for treatment procedures.   Debridement of mycotic and hypertrophic toenails, 1 through 5 bilateral and clearing of subungual debris. No ulceration, no infection noted. Debride porokeratosis  B/l. Cauterize porokeratosis left foot. Return Visit-Office Procedure: Patient instructed to return to the office for a follow up visit 10 weeks for continued evaluation and treatment.    Gardiner Barefoot DPM

## 2019-01-27 DIAGNOSIS — D631 Anemia in chronic kidney disease: Secondary | ICD-10-CM | POA: Diagnosis not present

## 2019-01-27 DIAGNOSIS — N2581 Secondary hyperparathyroidism of renal origin: Secondary | ICD-10-CM | POA: Diagnosis not present

## 2019-01-27 DIAGNOSIS — D509 Iron deficiency anemia, unspecified: Secondary | ICD-10-CM | POA: Diagnosis not present

## 2019-01-27 DIAGNOSIS — E1129 Type 2 diabetes mellitus with other diabetic kidney complication: Secondary | ICD-10-CM | POA: Diagnosis not present

## 2019-01-27 DIAGNOSIS — N186 End stage renal disease: Secondary | ICD-10-CM | POA: Diagnosis not present

## 2019-01-27 DIAGNOSIS — D689 Coagulation defect, unspecified: Secondary | ICD-10-CM | POA: Diagnosis not present

## 2019-01-30 DIAGNOSIS — E1129 Type 2 diabetes mellitus with other diabetic kidney complication: Secondary | ICD-10-CM | POA: Diagnosis not present

## 2019-01-30 DIAGNOSIS — D631 Anemia in chronic kidney disease: Secondary | ICD-10-CM | POA: Diagnosis not present

## 2019-01-30 DIAGNOSIS — N186 End stage renal disease: Secondary | ICD-10-CM | POA: Diagnosis not present

## 2019-01-30 DIAGNOSIS — D689 Coagulation defect, unspecified: Secondary | ICD-10-CM | POA: Diagnosis not present

## 2019-01-30 DIAGNOSIS — D509 Iron deficiency anemia, unspecified: Secondary | ICD-10-CM | POA: Diagnosis not present

## 2019-01-30 DIAGNOSIS — N2581 Secondary hyperparathyroidism of renal origin: Secondary | ICD-10-CM | POA: Diagnosis not present

## 2019-02-01 DIAGNOSIS — E1129 Type 2 diabetes mellitus with other diabetic kidney complication: Secondary | ICD-10-CM | POA: Diagnosis not present

## 2019-02-01 DIAGNOSIS — D631 Anemia in chronic kidney disease: Secondary | ICD-10-CM | POA: Diagnosis not present

## 2019-02-01 DIAGNOSIS — D689 Coagulation defect, unspecified: Secondary | ICD-10-CM | POA: Diagnosis not present

## 2019-02-01 DIAGNOSIS — N2581 Secondary hyperparathyroidism of renal origin: Secondary | ICD-10-CM | POA: Diagnosis not present

## 2019-02-01 DIAGNOSIS — N186 End stage renal disease: Secondary | ICD-10-CM | POA: Diagnosis not present

## 2019-02-01 DIAGNOSIS — D509 Iron deficiency anemia, unspecified: Secondary | ICD-10-CM | POA: Diagnosis not present

## 2019-02-03 DIAGNOSIS — N2581 Secondary hyperparathyroidism of renal origin: Secondary | ICD-10-CM | POA: Diagnosis not present

## 2019-02-03 DIAGNOSIS — D631 Anemia in chronic kidney disease: Secondary | ICD-10-CM | POA: Diagnosis not present

## 2019-02-03 DIAGNOSIS — D509 Iron deficiency anemia, unspecified: Secondary | ICD-10-CM | POA: Diagnosis not present

## 2019-02-03 DIAGNOSIS — E1129 Type 2 diabetes mellitus with other diabetic kidney complication: Secondary | ICD-10-CM | POA: Diagnosis not present

## 2019-02-03 DIAGNOSIS — N186 End stage renal disease: Secondary | ICD-10-CM | POA: Diagnosis not present

## 2019-02-03 DIAGNOSIS — D689 Coagulation defect, unspecified: Secondary | ICD-10-CM | POA: Diagnosis not present

## 2019-02-05 ENCOUNTER — Other Ambulatory Visit: Payer: Self-pay | Admitting: Internal Medicine

## 2019-02-06 DIAGNOSIS — D631 Anemia in chronic kidney disease: Secondary | ICD-10-CM | POA: Diagnosis not present

## 2019-02-06 DIAGNOSIS — D509 Iron deficiency anemia, unspecified: Secondary | ICD-10-CM | POA: Diagnosis not present

## 2019-02-06 DIAGNOSIS — D689 Coagulation defect, unspecified: Secondary | ICD-10-CM | POA: Diagnosis not present

## 2019-02-06 DIAGNOSIS — N186 End stage renal disease: Secondary | ICD-10-CM | POA: Diagnosis not present

## 2019-02-06 DIAGNOSIS — N2581 Secondary hyperparathyroidism of renal origin: Secondary | ICD-10-CM | POA: Diagnosis not present

## 2019-02-06 DIAGNOSIS — E1129 Type 2 diabetes mellitus with other diabetic kidney complication: Secondary | ICD-10-CM | POA: Diagnosis not present

## 2019-02-08 DIAGNOSIS — N2581 Secondary hyperparathyroidism of renal origin: Secondary | ICD-10-CM | POA: Diagnosis not present

## 2019-02-08 DIAGNOSIS — D689 Coagulation defect, unspecified: Secondary | ICD-10-CM | POA: Diagnosis not present

## 2019-02-08 DIAGNOSIS — D631 Anemia in chronic kidney disease: Secondary | ICD-10-CM | POA: Diagnosis not present

## 2019-02-08 DIAGNOSIS — D509 Iron deficiency anemia, unspecified: Secondary | ICD-10-CM | POA: Diagnosis not present

## 2019-02-08 DIAGNOSIS — N186 End stage renal disease: Secondary | ICD-10-CM | POA: Diagnosis not present

## 2019-02-08 DIAGNOSIS — E1129 Type 2 diabetes mellitus with other diabetic kidney complication: Secondary | ICD-10-CM | POA: Diagnosis not present

## 2019-02-09 DIAGNOSIS — Z992 Dependence on renal dialysis: Secondary | ICD-10-CM | POA: Diagnosis not present

## 2019-02-09 DIAGNOSIS — E1122 Type 2 diabetes mellitus with diabetic chronic kidney disease: Secondary | ICD-10-CM | POA: Diagnosis not present

## 2019-02-09 DIAGNOSIS — N186 End stage renal disease: Secondary | ICD-10-CM | POA: Diagnosis not present

## 2019-02-10 DIAGNOSIS — Z992 Dependence on renal dialysis: Secondary | ICD-10-CM | POA: Diagnosis not present

## 2019-02-10 DIAGNOSIS — D689 Coagulation defect, unspecified: Secondary | ICD-10-CM | POA: Diagnosis not present

## 2019-02-10 DIAGNOSIS — E1129 Type 2 diabetes mellitus with other diabetic kidney complication: Secondary | ICD-10-CM | POA: Diagnosis not present

## 2019-02-10 DIAGNOSIS — N186 End stage renal disease: Secondary | ICD-10-CM | POA: Diagnosis not present

## 2019-02-10 DIAGNOSIS — N2581 Secondary hyperparathyroidism of renal origin: Secondary | ICD-10-CM | POA: Diagnosis not present

## 2019-02-10 DIAGNOSIS — D509 Iron deficiency anemia, unspecified: Secondary | ICD-10-CM | POA: Diagnosis not present

## 2019-02-13 ENCOUNTER — Other Ambulatory Visit: Payer: Self-pay | Admitting: Internal Medicine

## 2019-02-13 DIAGNOSIS — D509 Iron deficiency anemia, unspecified: Secondary | ICD-10-CM | POA: Diagnosis not present

## 2019-02-13 DIAGNOSIS — D689 Coagulation defect, unspecified: Secondary | ICD-10-CM | POA: Diagnosis not present

## 2019-02-13 DIAGNOSIS — E1129 Type 2 diabetes mellitus with other diabetic kidney complication: Secondary | ICD-10-CM | POA: Diagnosis not present

## 2019-02-13 DIAGNOSIS — N186 End stage renal disease: Secondary | ICD-10-CM | POA: Diagnosis not present

## 2019-02-13 DIAGNOSIS — Z992 Dependence on renal dialysis: Secondary | ICD-10-CM | POA: Diagnosis not present

## 2019-02-13 DIAGNOSIS — N2581 Secondary hyperparathyroidism of renal origin: Secondary | ICD-10-CM | POA: Diagnosis not present

## 2019-02-15 DIAGNOSIS — D689 Coagulation defect, unspecified: Secondary | ICD-10-CM | POA: Diagnosis not present

## 2019-02-15 DIAGNOSIS — N2581 Secondary hyperparathyroidism of renal origin: Secondary | ICD-10-CM | POA: Diagnosis not present

## 2019-02-15 DIAGNOSIS — D509 Iron deficiency anemia, unspecified: Secondary | ICD-10-CM | POA: Diagnosis not present

## 2019-02-15 DIAGNOSIS — N186 End stage renal disease: Secondary | ICD-10-CM | POA: Diagnosis not present

## 2019-02-15 DIAGNOSIS — E1129 Type 2 diabetes mellitus with other diabetic kidney complication: Secondary | ICD-10-CM | POA: Diagnosis not present

## 2019-02-15 DIAGNOSIS — Z992 Dependence on renal dialysis: Secondary | ICD-10-CM | POA: Diagnosis not present

## 2019-02-17 DIAGNOSIS — E1129 Type 2 diabetes mellitus with other diabetic kidney complication: Secondary | ICD-10-CM | POA: Diagnosis not present

## 2019-02-17 DIAGNOSIS — D689 Coagulation defect, unspecified: Secondary | ICD-10-CM | POA: Diagnosis not present

## 2019-02-17 DIAGNOSIS — N186 End stage renal disease: Secondary | ICD-10-CM | POA: Diagnosis not present

## 2019-02-17 DIAGNOSIS — Z992 Dependence on renal dialysis: Secondary | ICD-10-CM | POA: Diagnosis not present

## 2019-02-17 DIAGNOSIS — N2581 Secondary hyperparathyroidism of renal origin: Secondary | ICD-10-CM | POA: Diagnosis not present

## 2019-02-17 DIAGNOSIS — D509 Iron deficiency anemia, unspecified: Secondary | ICD-10-CM | POA: Diagnosis not present

## 2019-02-20 DIAGNOSIS — N2581 Secondary hyperparathyroidism of renal origin: Secondary | ICD-10-CM | POA: Diagnosis not present

## 2019-02-20 DIAGNOSIS — Z992 Dependence on renal dialysis: Secondary | ICD-10-CM | POA: Diagnosis not present

## 2019-02-20 DIAGNOSIS — D689 Coagulation defect, unspecified: Secondary | ICD-10-CM | POA: Diagnosis not present

## 2019-02-20 DIAGNOSIS — N186 End stage renal disease: Secondary | ICD-10-CM | POA: Diagnosis not present

## 2019-02-20 DIAGNOSIS — D509 Iron deficiency anemia, unspecified: Secondary | ICD-10-CM | POA: Diagnosis not present

## 2019-02-20 DIAGNOSIS — E1129 Type 2 diabetes mellitus with other diabetic kidney complication: Secondary | ICD-10-CM | POA: Diagnosis not present

## 2019-02-22 DIAGNOSIS — E1129 Type 2 diabetes mellitus with other diabetic kidney complication: Secondary | ICD-10-CM | POA: Diagnosis not present

## 2019-02-22 DIAGNOSIS — D689 Coagulation defect, unspecified: Secondary | ICD-10-CM | POA: Diagnosis not present

## 2019-02-22 DIAGNOSIS — N2581 Secondary hyperparathyroidism of renal origin: Secondary | ICD-10-CM | POA: Diagnosis not present

## 2019-02-22 DIAGNOSIS — N186 End stage renal disease: Secondary | ICD-10-CM | POA: Diagnosis not present

## 2019-02-22 DIAGNOSIS — D509 Iron deficiency anemia, unspecified: Secondary | ICD-10-CM | POA: Diagnosis not present

## 2019-02-22 DIAGNOSIS — Z992 Dependence on renal dialysis: Secondary | ICD-10-CM | POA: Diagnosis not present

## 2019-02-24 DIAGNOSIS — D689 Coagulation defect, unspecified: Secondary | ICD-10-CM | POA: Diagnosis not present

## 2019-02-24 DIAGNOSIS — N186 End stage renal disease: Secondary | ICD-10-CM | POA: Diagnosis not present

## 2019-02-24 DIAGNOSIS — D509 Iron deficiency anemia, unspecified: Secondary | ICD-10-CM | POA: Diagnosis not present

## 2019-02-24 DIAGNOSIS — N2581 Secondary hyperparathyroidism of renal origin: Secondary | ICD-10-CM | POA: Diagnosis not present

## 2019-02-24 DIAGNOSIS — E1129 Type 2 diabetes mellitus with other diabetic kidney complication: Secondary | ICD-10-CM | POA: Diagnosis not present

## 2019-02-24 DIAGNOSIS — Z992 Dependence on renal dialysis: Secondary | ICD-10-CM | POA: Diagnosis not present

## 2019-02-27 DIAGNOSIS — N2581 Secondary hyperparathyroidism of renal origin: Secondary | ICD-10-CM | POA: Diagnosis not present

## 2019-02-27 DIAGNOSIS — E1129 Type 2 diabetes mellitus with other diabetic kidney complication: Secondary | ICD-10-CM | POA: Diagnosis not present

## 2019-02-27 DIAGNOSIS — D689 Coagulation defect, unspecified: Secondary | ICD-10-CM | POA: Diagnosis not present

## 2019-02-27 DIAGNOSIS — D509 Iron deficiency anemia, unspecified: Secondary | ICD-10-CM | POA: Diagnosis not present

## 2019-02-27 DIAGNOSIS — N186 End stage renal disease: Secondary | ICD-10-CM | POA: Diagnosis not present

## 2019-02-27 DIAGNOSIS — Z992 Dependence on renal dialysis: Secondary | ICD-10-CM | POA: Diagnosis not present

## 2019-03-01 DIAGNOSIS — D689 Coagulation defect, unspecified: Secondary | ICD-10-CM | POA: Diagnosis not present

## 2019-03-01 DIAGNOSIS — N2581 Secondary hyperparathyroidism of renal origin: Secondary | ICD-10-CM | POA: Diagnosis not present

## 2019-03-01 DIAGNOSIS — D509 Iron deficiency anemia, unspecified: Secondary | ICD-10-CM | POA: Diagnosis not present

## 2019-03-01 DIAGNOSIS — N186 End stage renal disease: Secondary | ICD-10-CM | POA: Diagnosis not present

## 2019-03-01 DIAGNOSIS — Z992 Dependence on renal dialysis: Secondary | ICD-10-CM | POA: Diagnosis not present

## 2019-03-01 DIAGNOSIS — E1129 Type 2 diabetes mellitus with other diabetic kidney complication: Secondary | ICD-10-CM | POA: Diagnosis not present

## 2019-03-03 DIAGNOSIS — D689 Coagulation defect, unspecified: Secondary | ICD-10-CM | POA: Diagnosis not present

## 2019-03-03 DIAGNOSIS — E1129 Type 2 diabetes mellitus with other diabetic kidney complication: Secondary | ICD-10-CM | POA: Diagnosis not present

## 2019-03-03 DIAGNOSIS — N186 End stage renal disease: Secondary | ICD-10-CM | POA: Diagnosis not present

## 2019-03-03 DIAGNOSIS — N2581 Secondary hyperparathyroidism of renal origin: Secondary | ICD-10-CM | POA: Diagnosis not present

## 2019-03-03 DIAGNOSIS — Z992 Dependence on renal dialysis: Secondary | ICD-10-CM | POA: Diagnosis not present

## 2019-03-03 DIAGNOSIS — D509 Iron deficiency anemia, unspecified: Secondary | ICD-10-CM | POA: Diagnosis not present

## 2019-03-06 DIAGNOSIS — D509 Iron deficiency anemia, unspecified: Secondary | ICD-10-CM | POA: Diagnosis not present

## 2019-03-06 DIAGNOSIS — N186 End stage renal disease: Secondary | ICD-10-CM | POA: Diagnosis not present

## 2019-03-06 DIAGNOSIS — Z992 Dependence on renal dialysis: Secondary | ICD-10-CM | POA: Diagnosis not present

## 2019-03-06 DIAGNOSIS — D689 Coagulation defect, unspecified: Secondary | ICD-10-CM | POA: Diagnosis not present

## 2019-03-06 DIAGNOSIS — E1129 Type 2 diabetes mellitus with other diabetic kidney complication: Secondary | ICD-10-CM | POA: Diagnosis not present

## 2019-03-06 DIAGNOSIS — N2581 Secondary hyperparathyroidism of renal origin: Secondary | ICD-10-CM | POA: Diagnosis not present

## 2019-03-07 ENCOUNTER — Other Ambulatory Visit: Payer: Self-pay | Admitting: Internal Medicine

## 2019-03-08 DIAGNOSIS — D689 Coagulation defect, unspecified: Secondary | ICD-10-CM | POA: Diagnosis not present

## 2019-03-08 DIAGNOSIS — N186 End stage renal disease: Secondary | ICD-10-CM | POA: Diagnosis not present

## 2019-03-08 DIAGNOSIS — E1129 Type 2 diabetes mellitus with other diabetic kidney complication: Secondary | ICD-10-CM | POA: Diagnosis not present

## 2019-03-08 DIAGNOSIS — N2581 Secondary hyperparathyroidism of renal origin: Secondary | ICD-10-CM | POA: Diagnosis not present

## 2019-03-08 DIAGNOSIS — D509 Iron deficiency anemia, unspecified: Secondary | ICD-10-CM | POA: Diagnosis not present

## 2019-03-08 DIAGNOSIS — Z992 Dependence on renal dialysis: Secondary | ICD-10-CM | POA: Diagnosis not present

## 2019-03-09 ENCOUNTER — Other Ambulatory Visit: Payer: Self-pay

## 2019-03-09 ENCOUNTER — Encounter: Payer: Self-pay | Admitting: Internal Medicine

## 2019-03-09 ENCOUNTER — Ambulatory Visit (INDEPENDENT_AMBULATORY_CARE_PROVIDER_SITE_OTHER): Payer: Medicare Other | Admitting: Internal Medicine

## 2019-03-09 VITALS — BP 128/72 | HR 102 | Temp 98.8°F | Ht 72.0 in | Wt 157.0 lb

## 2019-03-09 DIAGNOSIS — D649 Anemia, unspecified: Secondary | ICD-10-CM | POA: Diagnosis not present

## 2019-03-09 DIAGNOSIS — I10 Essential (primary) hypertension: Secondary | ICD-10-CM | POA: Diagnosis not present

## 2019-03-09 DIAGNOSIS — E782 Mixed hyperlipidemia: Secondary | ICD-10-CM

## 2019-03-09 DIAGNOSIS — E1129 Type 2 diabetes mellitus with other diabetic kidney complication: Secondary | ICD-10-CM | POA: Diagnosis not present

## 2019-03-09 DIAGNOSIS — Z23 Encounter for immunization: Secondary | ICD-10-CM

## 2019-03-09 LAB — POCT GLYCOSYLATED HEMOGLOBIN (HGB A1C): Hemoglobin A1C: 5.7 % — AB (ref 4.0–5.6)

## 2019-03-09 NOTE — Patient Instructions (Addendum)
.  You had the flu shot today  Your A1c was OK today  Please continue all other medications as before, and refills have been done if requested.  Please have the pharmacy call with any other refills you may need.  Please continue your efforts at being more active, low cholesterol diet, and weight control.  Please keep your appointments with your specialists as you may have planned  Please return in 6 months, or sooner if needed

## 2019-03-09 NOTE — Progress Notes (Signed)
Subjective:    Patient ID: Christopher Lopez, male    DOB: [birth year], 58 y.o.   MRN: [id number]  HPI  Here to f/u; overall doing ok,  Pt denies chest pain, increasing sob or doe, wheezing, orthopnea, PND, increased LE swelling, palpitations, dizziness or syncope.  Pt denies new neurological symptoms such as new headache, or facial or extremity weakness or numbness.  Pt denies polydipsia, polyuria, or low sugar episode.  Pt states overall good compliance with meds, mostly trying to follow appropriate diet, with wt overall stable,  but little exercise however.  See poidatry every 3 months.  No new complaints Here with mother 15yo.  No overt bleeding Past Medical History:  Diagnosis Date  . Alcohol abuse 09/18/2011  . Allergic rhinitis, cause unspecified 09/20/2011  . Anemia, unspecified 09/18/2011  . Arthritis   . Childhood asthma 09/20/2011  . CKD (chronic kidney disease) stage 5, GFR less than 15 ml/min (HCC)    Dialysis - T/Th/Sa  . Colitis 09/18/2011  . Dementia ([place]) 09/18/2011  . Diabetes mellitus   . Diverticulosis of colon without hemorrhage 02/27/2014  . Foot ulcer ([place]) 09/18/2011  . High cholesterol   . HTN (hypertension) 09/18/2011  . Hyperlipidemia 11/08/2011  . Hypertension   . Impaired glucose tolerance 09/18/2011  . Pneumonia 09/18/2011  . Pre-ulcerative corn or callous 11/20/2011  . Stroke [facility])    left leg deficit  . Type II or unspecified type diabetes mellitus without mention of complication, uncontrolled 09/20/2011  . Wears glasses    Past Surgical History:  Procedure Laterality Date  . AV FISTULA PLACEMENT Left 12/23/2017   Procedure: CREATION OF BASILIAC - CEPHALIC  ARTERIOVENOUS (AV) FISTULA  STAGE ONE;  Surgeon: Waynetta Sandy, MD;  Location: [place];  Service: Vascular;  Laterality: Left;  . AV FISTULA PLACEMENT Left 07/26/2018   Procedure: INSERTION OF ARTERIOVENOUS (AV) GORE-TEX GRAFT ARM;  Surgeon: Waynetta Sandy, MD;  Location: [place];  Service: Vascular;   Laterality: Left;  . BASCILIC VEIN TRANSPOSITION Left 03/01/2018   Procedure: BASILIC VEIN TRANSPOSITION SECOND STAGE;  Surgeon: Waynetta Sandy, MD;  Location: [place];  Service: Vascular;  Laterality: Left;  . INSERTION OF DIALYSIS CATHETER Right 12/23/2017   Procedure: INSERTION OF PALINDROME  DIALYSIS CATHETER;  Surgeon: Waynetta Sandy, MD;  Location: [place];  Service: Vascular;  Laterality: Right;  . THROMBECTOMY BRACHIAL ARTERY Left 07/26/2018   Procedure: THROMBECTOMY LEFT UPPER EXTREMITY;  Surgeon: Waynetta Sandy, MD;  Location: [place];  Service: Vascular;  Laterality: Left;  . TONSILLECTOMY  1985  . VEIN REPAIR Left 07/26/2018   Procedure: STENT LEFT AXILLARY VEIN;  Surgeon: Waynetta Sandy, MD;  Location: [place];  Service: Vascular;  Laterality: Left;    reports that he has quit smoking. His smoking use included cigarettes. He smoked 0.50 packs per day. He has never used smokeless tobacco. He reports previous alcohol use of about 2.0 standard drinks of alcohol per week. He reports that he does not use drugs. family history includes Diabetes in his maternal grandfather, mother, and sister; Heart disease in his mother; Prostate cancer in his paternal grandfather; Transient ischemic attack in his mother. No Known Allergies Current Outpatient Medications on File Prior to Visit  Medication Sig Dispense Refill  . acetaminophen (TYLENOL) 650 MG CR tablet Take 650 mg by mouth daily.    Marland Kitchen aspirin EC 81 MG tablet Take 1 tablet (81 mg total) by mouth daily. 90 tablet 11  .  diltiazem (CARDIZEM LA) 360 MG 24 hr tablet Take 360 mg by mouth daily.  6  . glucose blood (ONE TOUCH ULTRA TEST) test strip Use as instructed once daily E11.9 100 each 12  . hydrALAZINE (APRESOLINE) 100 MG tablet Take 100 mg by mouth 3 (three) times daily.  3  . JANUVIA 100 MG tablet     . Lancets (ONETOUCH ULTRASOFT) lancets 1 each by Other route 2 (two) times daily. Use to check blood  sugars once a day Dx e11.9 100 each 3  . lovastatin (MEVACOR) 40 MG tablet TAKE 1 TABLET BY MOUTH  DAILY 90 tablet 1  . metoprolol succinate (TOPROL-XL) 50 MG 24 hr tablet TAKE 1 TABLET BY MOUTH  DAILY WITH OR IMMEDIATLEY  FOLLOWING A MEAL 90 tablet 1  . Multiple Vitamin (MULTIVITAMIN) tablet Take 1 tablet by mouth daily.    Marland Kitchen oxyCODONE-acetaminophen (PERCOCET) 5-325 MG tablet Take 1 tablet by mouth every 6 (six) hours as needed for severe pain. 20 tablet 0  . pioglitazone (ACTOS) 15 MG tablet Take 1 tablet (15 mg total) by mouth daily. 90 tablet 3  . TRULICITY 1.5 KA/7.6OT SOPN INJECT 0.5MLS INTO THE SKIN ONCE A WEEK 4 mL 2  . vitamin B-12 (CYANOCOBALAMIN) 1000 MCG tablet Take 1,000 mcg by mouth daily.     No current facility-administered medications on file prior to visit.    Review of Systems  Constitutional: Negative for other unusual diaphoresis or sweats HENT: Negative for ear discharge or swelling Eyes: Negative for other worsening visual disturbances Respiratory: Negative for stridor or other swelling  Gastrointestinal: Negative for worsening distension or other blood Genitourinary: Negative for retention or other urinary change Musculoskeletal: Negative for other MSK pain or swelling Skin: Negative for color change or other new lesions Neurological: Negative for worsening tremors and other numbness  Psychiatric/Behavioral: Negative for worsening agitation or other fatigue All other system neg per pt    Objective:   Physical Exam BP 128/72   Pulse (!) 102   Temp 98.8 F (37.1 C) (Oral)   Ht 6' (1.829 m)   Wt 157 lb (71.2 kg)   SpO2 99%   BMI 21.29 kg/m  VS noted,  Constitutional: Pt appears in NAD HENT: Head: NCAT.  Right Ear: External ear normal.  Left Ear: External ear normal.  Eyes: . Pupils are equal, round, and reactive to light. Conjunctivae and EOM are normal Nose: without d/c or deformity Neck: Neck supple. Gross normal ROM Cardiovascular: Normal rate and  regular rhythm.   Pulmonary/Chest: Effort normal and breath sounds without rales or wheezing.  Abd:  Soft, NT, ND, + BS, no organomegaly Neurological: Pt is alert. At baseline orientation, motor grossly intact Skin: Skin is warm. No rashes, other new lesions, no LE edema Psychiatric: Pt behavior is normal without agitation  No other exam findings Lab Results  Component Value Date   WBC 9.4 09/06/2018   HGB 13.2 09/06/2018   HCT 39.8 09/06/2018   PLT 207.0 09/06/2018   GLUCOSE 89 09/06/2018   CHOL 194 09/06/2018   TRIG 120.0 09/06/2018   HDL 45.30 09/06/2018   LDLDIRECT 131.0 08/11/2017   LDLCALC 125 (H) 09/06/2018   ALT 9 09/06/2018   AST 14 09/06/2018   NA 137 09/06/2018   K 5.1 09/06/2018   CL 97 09/06/2018   CREATININE 7.88 (HH) 09/06/2018   BUN 39 (H) 09/06/2018   CO2 26 09/06/2018   TSH 1.32 09/06/2018   PSA 0.55 08/11/2017   HGBA1C  5.7 (A) 03/09/2019   MICROALBUR 98.3 (H) 08/11/2017   POCT glycosylated hemoglobin (Hb A1C) Order: [id number] Status:  Final result Visible to patient:  No (not released) Dx:  Type 2 diabetes mellitus with other d...  Ref Range & Units 11:03 (03/09/19) 60mo ago (09/06/18) 52yr ago (12/23/17) 33yr ago (08/11/17) 54yr ago (02/02/17) 4yr ago (08/05/16) 53yr ago (01/09/16)  Hemoglobin A1C 4.0 - 5.6 % 5.7Abnormal   4.8 R, CM  5.3 R, CM  6.1 R, CM  6.9 R  5.5 R, CM  5.8            Assessment & Plan:

## 2019-03-10 DIAGNOSIS — D689 Coagulation defect, unspecified: Secondary | ICD-10-CM | POA: Diagnosis not present

## 2019-03-10 DIAGNOSIS — E1129 Type 2 diabetes mellitus with other diabetic kidney complication: Secondary | ICD-10-CM | POA: Diagnosis not present

## 2019-03-10 DIAGNOSIS — Z992 Dependence on renal dialysis: Secondary | ICD-10-CM | POA: Diagnosis not present

## 2019-03-10 DIAGNOSIS — N2581 Secondary hyperparathyroidism of renal origin: Secondary | ICD-10-CM | POA: Diagnosis not present

## 2019-03-10 DIAGNOSIS — D509 Iron deficiency anemia, unspecified: Secondary | ICD-10-CM | POA: Diagnosis not present

## 2019-03-10 DIAGNOSIS — N186 End stage renal disease: Secondary | ICD-10-CM | POA: Diagnosis not present

## 2019-03-11 ENCOUNTER — Encounter: Payer: Self-pay | Admitting: Internal Medicine

## 2019-03-11 NOTE — Assessment & Plan Note (Signed)
stable overall by history and exam, recent data reviewed with pt, and pt to continue medical treatment as before,  to f/u any worsening symptoms or concerns  

## 2019-03-12 DIAGNOSIS — E1122 Type 2 diabetes mellitus with diabetic chronic kidney disease: Secondary | ICD-10-CM | POA: Diagnosis not present

## 2019-03-12 DIAGNOSIS — Z992 Dependence on renal dialysis: Secondary | ICD-10-CM | POA: Diagnosis not present

## 2019-03-12 DIAGNOSIS — N186 End stage renal disease: Secondary | ICD-10-CM | POA: Diagnosis not present

## 2019-03-13 DIAGNOSIS — E1129 Type 2 diabetes mellitus with other diabetic kidney complication: Secondary | ICD-10-CM | POA: Diagnosis not present

## 2019-03-13 DIAGNOSIS — D689 Coagulation defect, unspecified: Secondary | ICD-10-CM | POA: Diagnosis not present

## 2019-03-13 DIAGNOSIS — Z992 Dependence on renal dialysis: Secondary | ICD-10-CM | POA: Diagnosis not present

## 2019-03-13 DIAGNOSIS — N2581 Secondary hyperparathyroidism of renal origin: Secondary | ICD-10-CM | POA: Diagnosis not present

## 2019-03-13 DIAGNOSIS — D509 Iron deficiency anemia, unspecified: Secondary | ICD-10-CM | POA: Diagnosis not present

## 2019-03-13 DIAGNOSIS — N186 End stage renal disease: Secondary | ICD-10-CM | POA: Diagnosis not present

## 2019-03-15 DIAGNOSIS — E1129 Type 2 diabetes mellitus with other diabetic kidney complication: Secondary | ICD-10-CM | POA: Diagnosis not present

## 2019-03-15 DIAGNOSIS — N2581 Secondary hyperparathyroidism of renal origin: Secondary | ICD-10-CM | POA: Diagnosis not present

## 2019-03-15 DIAGNOSIS — D509 Iron deficiency anemia, unspecified: Secondary | ICD-10-CM | POA: Diagnosis not present

## 2019-03-15 DIAGNOSIS — N186 End stage renal disease: Secondary | ICD-10-CM | POA: Diagnosis not present

## 2019-03-15 DIAGNOSIS — D689 Coagulation defect, unspecified: Secondary | ICD-10-CM | POA: Diagnosis not present

## 2019-03-15 DIAGNOSIS — Z992 Dependence on renal dialysis: Secondary | ICD-10-CM | POA: Diagnosis not present

## 2019-03-17 DIAGNOSIS — D689 Coagulation defect, unspecified: Secondary | ICD-10-CM | POA: Diagnosis not present

## 2019-03-17 DIAGNOSIS — N2581 Secondary hyperparathyroidism of renal origin: Secondary | ICD-10-CM | POA: Diagnosis not present

## 2019-03-17 DIAGNOSIS — Z992 Dependence on renal dialysis: Secondary | ICD-10-CM | POA: Diagnosis not present

## 2019-03-17 DIAGNOSIS — E1129 Type 2 diabetes mellitus with other diabetic kidney complication: Secondary | ICD-10-CM | POA: Diagnosis not present

## 2019-03-17 DIAGNOSIS — N186 End stage renal disease: Secondary | ICD-10-CM | POA: Diagnosis not present

## 2019-03-17 DIAGNOSIS — D509 Iron deficiency anemia, unspecified: Secondary | ICD-10-CM | POA: Diagnosis not present

## 2019-03-20 DIAGNOSIS — D689 Coagulation defect, unspecified: Secondary | ICD-10-CM | POA: Diagnosis not present

## 2019-03-20 DIAGNOSIS — Z992 Dependence on renal dialysis: Secondary | ICD-10-CM | POA: Diagnosis not present

## 2019-03-20 DIAGNOSIS — E1129 Type 2 diabetes mellitus with other diabetic kidney complication: Secondary | ICD-10-CM | POA: Diagnosis not present

## 2019-03-20 DIAGNOSIS — N186 End stage renal disease: Secondary | ICD-10-CM | POA: Diagnosis not present

## 2019-03-20 DIAGNOSIS — D509 Iron deficiency anemia, unspecified: Secondary | ICD-10-CM | POA: Diagnosis not present

## 2019-03-20 DIAGNOSIS — N2581 Secondary hyperparathyroidism of renal origin: Secondary | ICD-10-CM | POA: Diagnosis not present

## 2019-03-22 DIAGNOSIS — N186 End stage renal disease: Secondary | ICD-10-CM | POA: Diagnosis not present

## 2019-03-22 DIAGNOSIS — N2581 Secondary hyperparathyroidism of renal origin: Secondary | ICD-10-CM | POA: Diagnosis not present

## 2019-03-22 DIAGNOSIS — E1129 Type 2 diabetes mellitus with other diabetic kidney complication: Secondary | ICD-10-CM | POA: Diagnosis not present

## 2019-03-22 DIAGNOSIS — Z992 Dependence on renal dialysis: Secondary | ICD-10-CM | POA: Diagnosis not present

## 2019-03-22 DIAGNOSIS — D509 Iron deficiency anemia, unspecified: Secondary | ICD-10-CM | POA: Diagnosis not present

## 2019-03-22 DIAGNOSIS — D689 Coagulation defect, unspecified: Secondary | ICD-10-CM | POA: Diagnosis not present

## 2019-03-24 DIAGNOSIS — Z992 Dependence on renal dialysis: Secondary | ICD-10-CM | POA: Diagnosis not present

## 2019-03-24 DIAGNOSIS — N186 End stage renal disease: Secondary | ICD-10-CM | POA: Diagnosis not present

## 2019-03-24 DIAGNOSIS — D689 Coagulation defect, unspecified: Secondary | ICD-10-CM | POA: Diagnosis not present

## 2019-03-24 DIAGNOSIS — D509 Iron deficiency anemia, unspecified: Secondary | ICD-10-CM | POA: Diagnosis not present

## 2019-03-24 DIAGNOSIS — N2581 Secondary hyperparathyroidism of renal origin: Secondary | ICD-10-CM | POA: Diagnosis not present

## 2019-03-24 DIAGNOSIS — E1129 Type 2 diabetes mellitus with other diabetic kidney complication: Secondary | ICD-10-CM | POA: Diagnosis not present

## 2019-03-27 DIAGNOSIS — D689 Coagulation defect, unspecified: Secondary | ICD-10-CM | POA: Diagnosis not present

## 2019-03-27 DIAGNOSIS — D509 Iron deficiency anemia, unspecified: Secondary | ICD-10-CM | POA: Diagnosis not present

## 2019-03-27 DIAGNOSIS — N186 End stage renal disease: Secondary | ICD-10-CM | POA: Diagnosis not present

## 2019-03-27 DIAGNOSIS — N2581 Secondary hyperparathyroidism of renal origin: Secondary | ICD-10-CM | POA: Diagnosis not present

## 2019-03-27 DIAGNOSIS — E1129 Type 2 diabetes mellitus with other diabetic kidney complication: Secondary | ICD-10-CM | POA: Diagnosis not present

## 2019-03-27 DIAGNOSIS — Z992 Dependence on renal dialysis: Secondary | ICD-10-CM | POA: Diagnosis not present

## 2019-03-29 DIAGNOSIS — E1129 Type 2 diabetes mellitus with other diabetic kidney complication: Secondary | ICD-10-CM | POA: Diagnosis not present

## 2019-03-29 DIAGNOSIS — D509 Iron deficiency anemia, unspecified: Secondary | ICD-10-CM | POA: Diagnosis not present

## 2019-03-29 DIAGNOSIS — N186 End stage renal disease: Secondary | ICD-10-CM | POA: Diagnosis not present

## 2019-03-29 DIAGNOSIS — Z992 Dependence on renal dialysis: Secondary | ICD-10-CM | POA: Diagnosis not present

## 2019-03-29 DIAGNOSIS — N2581 Secondary hyperparathyroidism of renal origin: Secondary | ICD-10-CM | POA: Diagnosis not present

## 2019-03-29 DIAGNOSIS — D689 Coagulation defect, unspecified: Secondary | ICD-10-CM | POA: Diagnosis not present

## 2019-03-31 DIAGNOSIS — Z992 Dependence on renal dialysis: Secondary | ICD-10-CM | POA: Diagnosis not present

## 2019-03-31 DIAGNOSIS — N2581 Secondary hyperparathyroidism of renal origin: Secondary | ICD-10-CM | POA: Diagnosis not present

## 2019-03-31 DIAGNOSIS — D509 Iron deficiency anemia, unspecified: Secondary | ICD-10-CM | POA: Diagnosis not present

## 2019-03-31 DIAGNOSIS — E1129 Type 2 diabetes mellitus with other diabetic kidney complication: Secondary | ICD-10-CM | POA: Diagnosis not present

## 2019-03-31 DIAGNOSIS — N186 End stage renal disease: Secondary | ICD-10-CM | POA: Diagnosis not present

## 2019-03-31 DIAGNOSIS — D689 Coagulation defect, unspecified: Secondary | ICD-10-CM | POA: Diagnosis not present

## 2019-04-03 DIAGNOSIS — E1129 Type 2 diabetes mellitus with other diabetic kidney complication: Secondary | ICD-10-CM | POA: Diagnosis not present

## 2019-04-03 DIAGNOSIS — D689 Coagulation defect, unspecified: Secondary | ICD-10-CM | POA: Diagnosis not present

## 2019-04-03 DIAGNOSIS — N2581 Secondary hyperparathyroidism of renal origin: Secondary | ICD-10-CM | POA: Diagnosis not present

## 2019-04-03 DIAGNOSIS — Z992 Dependence on renal dialysis: Secondary | ICD-10-CM | POA: Diagnosis not present

## 2019-04-03 DIAGNOSIS — D509 Iron deficiency anemia, unspecified: Secondary | ICD-10-CM | POA: Diagnosis not present

## 2019-04-03 DIAGNOSIS — N186 End stage renal disease: Secondary | ICD-10-CM | POA: Diagnosis not present

## 2019-04-05 DIAGNOSIS — Z992 Dependence on renal dialysis: Secondary | ICD-10-CM | POA: Diagnosis not present

## 2019-04-05 DIAGNOSIS — E1129 Type 2 diabetes mellitus with other diabetic kidney complication: Secondary | ICD-10-CM | POA: Diagnosis not present

## 2019-04-05 DIAGNOSIS — N2581 Secondary hyperparathyroidism of renal origin: Secondary | ICD-10-CM | POA: Diagnosis not present

## 2019-04-05 DIAGNOSIS — D509 Iron deficiency anemia, unspecified: Secondary | ICD-10-CM | POA: Diagnosis not present

## 2019-04-05 DIAGNOSIS — N186 End stage renal disease: Secondary | ICD-10-CM | POA: Diagnosis not present

## 2019-04-05 DIAGNOSIS — D689 Coagulation defect, unspecified: Secondary | ICD-10-CM | POA: Diagnosis not present

## 2019-04-06 ENCOUNTER — Ambulatory Visit: Payer: Medicare Other | Admitting: Podiatry

## 2019-04-07 DIAGNOSIS — D509 Iron deficiency anemia, unspecified: Secondary | ICD-10-CM | POA: Diagnosis not present

## 2019-04-07 DIAGNOSIS — N186 End stage renal disease: Secondary | ICD-10-CM | POA: Diagnosis not present

## 2019-04-07 DIAGNOSIS — E1129 Type 2 diabetes mellitus with other diabetic kidney complication: Secondary | ICD-10-CM | POA: Diagnosis not present

## 2019-04-07 DIAGNOSIS — D689 Coagulation defect, unspecified: Secondary | ICD-10-CM | POA: Diagnosis not present

## 2019-04-07 DIAGNOSIS — Z992 Dependence on renal dialysis: Secondary | ICD-10-CM | POA: Diagnosis not present

## 2019-04-07 DIAGNOSIS — N2581 Secondary hyperparathyroidism of renal origin: Secondary | ICD-10-CM | POA: Diagnosis not present

## 2019-04-10 DIAGNOSIS — N186 End stage renal disease: Secondary | ICD-10-CM | POA: Diagnosis not present

## 2019-04-10 DIAGNOSIS — D689 Coagulation defect, unspecified: Secondary | ICD-10-CM | POA: Diagnosis not present

## 2019-04-10 DIAGNOSIS — E1129 Type 2 diabetes mellitus with other diabetic kidney complication: Secondary | ICD-10-CM | POA: Diagnosis not present

## 2019-04-10 DIAGNOSIS — D509 Iron deficiency anemia, unspecified: Secondary | ICD-10-CM | POA: Diagnosis not present

## 2019-04-10 DIAGNOSIS — Z992 Dependence on renal dialysis: Secondary | ICD-10-CM | POA: Diagnosis not present

## 2019-04-10 DIAGNOSIS — N2581 Secondary hyperparathyroidism of renal origin: Secondary | ICD-10-CM | POA: Diagnosis not present

## 2019-04-11 DIAGNOSIS — N186 End stage renal disease: Secondary | ICD-10-CM | POA: Diagnosis not present

## 2019-04-11 DIAGNOSIS — E1122 Type 2 diabetes mellitus with diabetic chronic kidney disease: Secondary | ICD-10-CM | POA: Diagnosis not present

## 2019-04-11 DIAGNOSIS — Z992 Dependence on renal dialysis: Secondary | ICD-10-CM | POA: Diagnosis not present

## 2019-04-12 DIAGNOSIS — N2581 Secondary hyperparathyroidism of renal origin: Secondary | ICD-10-CM | POA: Diagnosis not present

## 2019-04-12 DIAGNOSIS — N186 End stage renal disease: Secondary | ICD-10-CM | POA: Diagnosis not present

## 2019-04-12 DIAGNOSIS — Z992 Dependence on renal dialysis: Secondary | ICD-10-CM | POA: Diagnosis not present

## 2019-04-12 DIAGNOSIS — E1129 Type 2 diabetes mellitus with other diabetic kidney complication: Secondary | ICD-10-CM | POA: Diagnosis not present

## 2019-04-12 DIAGNOSIS — D689 Coagulation defect, unspecified: Secondary | ICD-10-CM | POA: Diagnosis not present

## 2019-04-14 DIAGNOSIS — N186 End stage renal disease: Secondary | ICD-10-CM | POA: Diagnosis not present

## 2019-04-14 DIAGNOSIS — D689 Coagulation defect, unspecified: Secondary | ICD-10-CM | POA: Diagnosis not present

## 2019-04-14 DIAGNOSIS — E1129 Type 2 diabetes mellitus with other diabetic kidney complication: Secondary | ICD-10-CM | POA: Diagnosis not present

## 2019-04-14 DIAGNOSIS — N2581 Secondary hyperparathyroidism of renal origin: Secondary | ICD-10-CM | POA: Diagnosis not present

## 2019-04-14 DIAGNOSIS — Z992 Dependence on renal dialysis: Secondary | ICD-10-CM | POA: Diagnosis not present

## 2019-04-17 DIAGNOSIS — D689 Coagulation defect, unspecified: Secondary | ICD-10-CM | POA: Diagnosis not present

## 2019-04-17 DIAGNOSIS — E1129 Type 2 diabetes mellitus with other diabetic kidney complication: Secondary | ICD-10-CM | POA: Diagnosis not present

## 2019-04-17 DIAGNOSIS — N2581 Secondary hyperparathyroidism of renal origin: Secondary | ICD-10-CM | POA: Diagnosis not present

## 2019-04-17 DIAGNOSIS — N186 End stage renal disease: Secondary | ICD-10-CM | POA: Diagnosis not present

## 2019-04-17 DIAGNOSIS — Z992 Dependence on renal dialysis: Secondary | ICD-10-CM | POA: Diagnosis not present

## 2019-04-19 DIAGNOSIS — N2581 Secondary hyperparathyroidism of renal origin: Secondary | ICD-10-CM | POA: Diagnosis not present

## 2019-04-19 DIAGNOSIS — Z992 Dependence on renal dialysis: Secondary | ICD-10-CM | POA: Diagnosis not present

## 2019-04-19 DIAGNOSIS — E1129 Type 2 diabetes mellitus with other diabetic kidney complication: Secondary | ICD-10-CM | POA: Diagnosis not present

## 2019-04-19 DIAGNOSIS — N186 End stage renal disease: Secondary | ICD-10-CM | POA: Diagnosis not present

## 2019-04-19 DIAGNOSIS — D689 Coagulation defect, unspecified: Secondary | ICD-10-CM | POA: Diagnosis not present

## 2019-04-21 DIAGNOSIS — D689 Coagulation defect, unspecified: Secondary | ICD-10-CM | POA: Diagnosis not present

## 2019-04-21 DIAGNOSIS — N2581 Secondary hyperparathyroidism of renal origin: Secondary | ICD-10-CM | POA: Diagnosis not present

## 2019-04-21 DIAGNOSIS — E1129 Type 2 diabetes mellitus with other diabetic kidney complication: Secondary | ICD-10-CM | POA: Diagnosis not present

## 2019-04-21 DIAGNOSIS — Z992 Dependence on renal dialysis: Secondary | ICD-10-CM | POA: Diagnosis not present

## 2019-04-21 DIAGNOSIS — N186 End stage renal disease: Secondary | ICD-10-CM | POA: Diagnosis not present

## 2019-04-24 DIAGNOSIS — D689 Coagulation defect, unspecified: Secondary | ICD-10-CM | POA: Diagnosis not present

## 2019-04-24 DIAGNOSIS — N2581 Secondary hyperparathyroidism of renal origin: Secondary | ICD-10-CM | POA: Diagnosis not present

## 2019-04-24 DIAGNOSIS — E1129 Type 2 diabetes mellitus with other diabetic kidney complication: Secondary | ICD-10-CM | POA: Diagnosis not present

## 2019-04-24 DIAGNOSIS — Z992 Dependence on renal dialysis: Secondary | ICD-10-CM | POA: Diagnosis not present

## 2019-04-24 DIAGNOSIS — N186 End stage renal disease: Secondary | ICD-10-CM | POA: Diagnosis not present

## 2019-04-26 DIAGNOSIS — N186 End stage renal disease: Secondary | ICD-10-CM | POA: Diagnosis not present

## 2019-04-26 DIAGNOSIS — N2581 Secondary hyperparathyroidism of renal origin: Secondary | ICD-10-CM | POA: Diagnosis not present

## 2019-04-26 DIAGNOSIS — D689 Coagulation defect, unspecified: Secondary | ICD-10-CM | POA: Diagnosis not present

## 2019-04-26 DIAGNOSIS — Z992 Dependence on renal dialysis: Secondary | ICD-10-CM | POA: Diagnosis not present

## 2019-04-26 DIAGNOSIS — E1129 Type 2 diabetes mellitus with other diabetic kidney complication: Secondary | ICD-10-CM | POA: Diagnosis not present

## 2019-04-28 DIAGNOSIS — N2581 Secondary hyperparathyroidism of renal origin: Secondary | ICD-10-CM | POA: Diagnosis not present

## 2019-04-28 DIAGNOSIS — Z992 Dependence on renal dialysis: Secondary | ICD-10-CM | POA: Diagnosis not present

## 2019-04-28 DIAGNOSIS — D689 Coagulation defect, unspecified: Secondary | ICD-10-CM | POA: Diagnosis not present

## 2019-04-28 DIAGNOSIS — E1129 Type 2 diabetes mellitus with other diabetic kidney complication: Secondary | ICD-10-CM | POA: Diagnosis not present

## 2019-04-28 DIAGNOSIS — N186 End stage renal disease: Secondary | ICD-10-CM | POA: Diagnosis not present

## 2019-05-01 DIAGNOSIS — Z992 Dependence on renal dialysis: Secondary | ICD-10-CM | POA: Diagnosis not present

## 2019-05-01 DIAGNOSIS — N186 End stage renal disease: Secondary | ICD-10-CM | POA: Diagnosis not present

## 2019-05-01 DIAGNOSIS — N2581 Secondary hyperparathyroidism of renal origin: Secondary | ICD-10-CM | POA: Diagnosis not present

## 2019-05-01 DIAGNOSIS — E1129 Type 2 diabetes mellitus with other diabetic kidney complication: Secondary | ICD-10-CM | POA: Diagnosis not present

## 2019-05-01 DIAGNOSIS — D689 Coagulation defect, unspecified: Secondary | ICD-10-CM | POA: Diagnosis not present

## 2019-05-03 DIAGNOSIS — N2581 Secondary hyperparathyroidism of renal origin: Secondary | ICD-10-CM | POA: Diagnosis not present

## 2019-05-03 DIAGNOSIS — D689 Coagulation defect, unspecified: Secondary | ICD-10-CM | POA: Diagnosis not present

## 2019-05-03 DIAGNOSIS — E1129 Type 2 diabetes mellitus with other diabetic kidney complication: Secondary | ICD-10-CM | POA: Diagnosis not present

## 2019-05-03 DIAGNOSIS — Z992 Dependence on renal dialysis: Secondary | ICD-10-CM | POA: Diagnosis not present

## 2019-05-03 DIAGNOSIS — N186 End stage renal disease: Secondary | ICD-10-CM | POA: Diagnosis not present

## 2019-05-05 DIAGNOSIS — N2581 Secondary hyperparathyroidism of renal origin: Secondary | ICD-10-CM | POA: Diagnosis not present

## 2019-05-05 DIAGNOSIS — E1129 Type 2 diabetes mellitus with other diabetic kidney complication: Secondary | ICD-10-CM | POA: Diagnosis not present

## 2019-05-05 DIAGNOSIS — Z992 Dependence on renal dialysis: Secondary | ICD-10-CM | POA: Diagnosis not present

## 2019-05-05 DIAGNOSIS — D689 Coagulation defect, unspecified: Secondary | ICD-10-CM | POA: Diagnosis not present

## 2019-05-05 DIAGNOSIS — N186 End stage renal disease: Secondary | ICD-10-CM | POA: Diagnosis not present

## 2019-05-08 DIAGNOSIS — N186 End stage renal disease: Secondary | ICD-10-CM | POA: Diagnosis not present

## 2019-05-08 DIAGNOSIS — N2581 Secondary hyperparathyroidism of renal origin: Secondary | ICD-10-CM | POA: Diagnosis not present

## 2019-05-08 DIAGNOSIS — E1129 Type 2 diabetes mellitus with other diabetic kidney complication: Secondary | ICD-10-CM | POA: Diagnosis not present

## 2019-05-08 DIAGNOSIS — D689 Coagulation defect, unspecified: Secondary | ICD-10-CM | POA: Diagnosis not present

## 2019-05-08 DIAGNOSIS — Z992 Dependence on renal dialysis: Secondary | ICD-10-CM | POA: Diagnosis not present

## 2019-05-10 DIAGNOSIS — D689 Coagulation defect, unspecified: Secondary | ICD-10-CM | POA: Diagnosis not present

## 2019-05-10 DIAGNOSIS — N186 End stage renal disease: Secondary | ICD-10-CM | POA: Diagnosis not present

## 2019-05-10 DIAGNOSIS — N2581 Secondary hyperparathyroidism of renal origin: Secondary | ICD-10-CM | POA: Diagnosis not present

## 2019-05-10 DIAGNOSIS — E1129 Type 2 diabetes mellitus with other diabetic kidney complication: Secondary | ICD-10-CM | POA: Diagnosis not present

## 2019-05-10 DIAGNOSIS — Z992 Dependence on renal dialysis: Secondary | ICD-10-CM | POA: Diagnosis not present

## 2019-05-12 DIAGNOSIS — E1129 Type 2 diabetes mellitus with other diabetic kidney complication: Secondary | ICD-10-CM | POA: Diagnosis not present

## 2019-05-12 DIAGNOSIS — D689 Coagulation defect, unspecified: Secondary | ICD-10-CM | POA: Diagnosis not present

## 2019-05-12 DIAGNOSIS — Z992 Dependence on renal dialysis: Secondary | ICD-10-CM | POA: Diagnosis not present

## 2019-05-12 DIAGNOSIS — N2581 Secondary hyperparathyroidism of renal origin: Secondary | ICD-10-CM | POA: Diagnosis not present

## 2019-05-12 DIAGNOSIS — E1122 Type 2 diabetes mellitus with diabetic chronic kidney disease: Secondary | ICD-10-CM | POA: Diagnosis not present

## 2019-05-12 DIAGNOSIS — N186 End stage renal disease: Secondary | ICD-10-CM | POA: Diagnosis not present

## 2019-05-15 DIAGNOSIS — D509 Iron deficiency anemia, unspecified: Secondary | ICD-10-CM | POA: Diagnosis not present

## 2019-05-15 DIAGNOSIS — N2581 Secondary hyperparathyroidism of renal origin: Secondary | ICD-10-CM | POA: Diagnosis not present

## 2019-05-15 DIAGNOSIS — Z992 Dependence on renal dialysis: Secondary | ICD-10-CM | POA: Diagnosis not present

## 2019-05-15 DIAGNOSIS — E1129 Type 2 diabetes mellitus with other diabetic kidney complication: Secondary | ICD-10-CM | POA: Diagnosis not present

## 2019-05-15 DIAGNOSIS — N186 End stage renal disease: Secondary | ICD-10-CM | POA: Diagnosis not present

## 2019-05-15 DIAGNOSIS — D689 Coagulation defect, unspecified: Secondary | ICD-10-CM | POA: Diagnosis not present

## 2019-05-17 DIAGNOSIS — D509 Iron deficiency anemia, unspecified: Secondary | ICD-10-CM | POA: Diagnosis not present

## 2019-05-17 DIAGNOSIS — E1129 Type 2 diabetes mellitus with other diabetic kidney complication: Secondary | ICD-10-CM | POA: Diagnosis not present

## 2019-05-17 DIAGNOSIS — N2581 Secondary hyperparathyroidism of renal origin: Secondary | ICD-10-CM | POA: Diagnosis not present

## 2019-05-17 DIAGNOSIS — D689 Coagulation defect, unspecified: Secondary | ICD-10-CM | POA: Diagnosis not present

## 2019-05-17 DIAGNOSIS — Z992 Dependence on renal dialysis: Secondary | ICD-10-CM | POA: Diagnosis not present

## 2019-05-17 DIAGNOSIS — N186 End stage renal disease: Secondary | ICD-10-CM | POA: Diagnosis not present

## 2019-05-17 IMAGING — CT CT ABD-PELV W/O CM
2 of 4 series · 16 of 46 positions shown, 18 images · non-contrast
Comparison: CT abdomen and pelvis 10/27/2017.

CLINICAL DATA: Abdominal pain, nausea and vomiting beginning today.

EXAM:
CT ABDOMEN AND PELVIS WITHOUT CONTRAST
TECHNIQUE: Multidetector CT imaging of the abdomen and pelvis was performed
following the standard protocol without IV contrast.

[Series 3: ap without · axial · non-contrast · 0.65mm/px · z∈[-930,-530]mm · 13 of 91 slices shown, 15 images]
[im 6/91  soft-tissue]
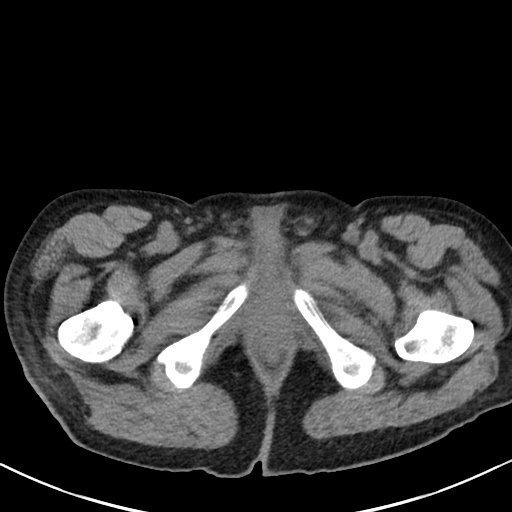
[im 6/91  bone]
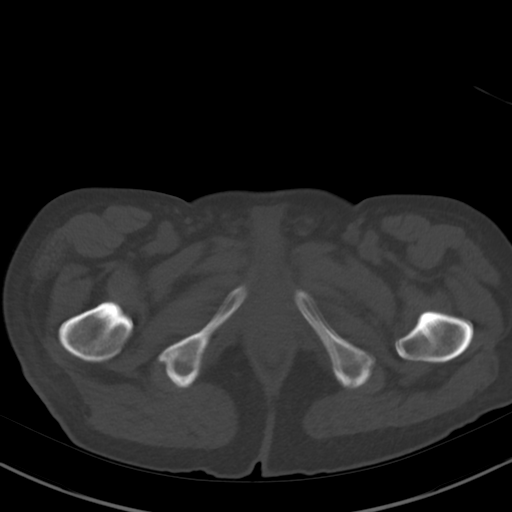
[im 11/91  soft-tissue]
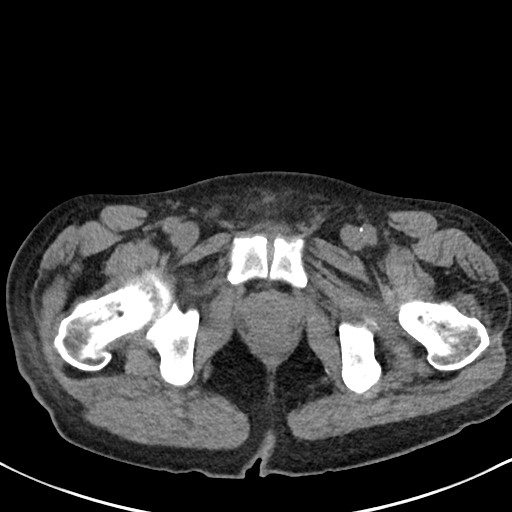
[im 21/91  soft-tissue]
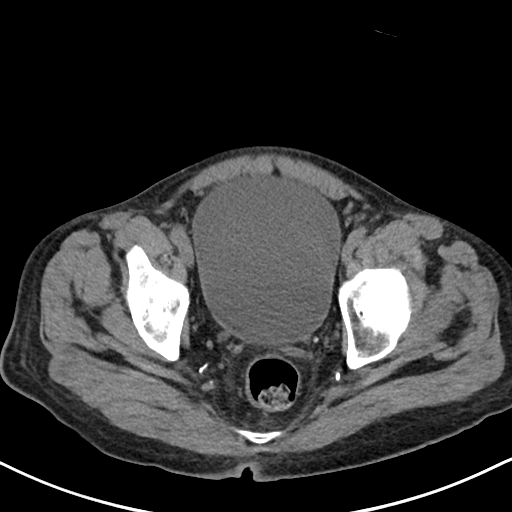
[im 26/91  soft-tissue]
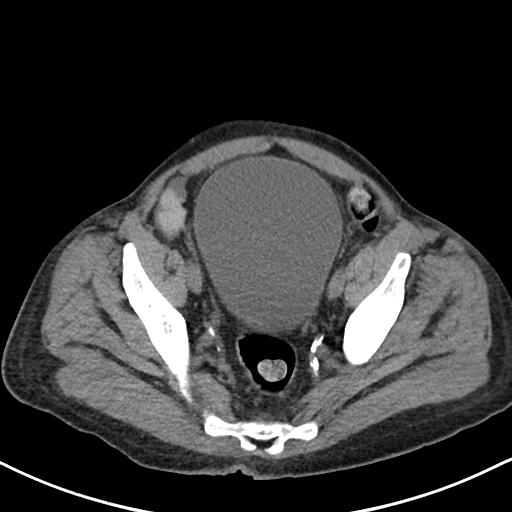
[im 31/91  soft-tissue]
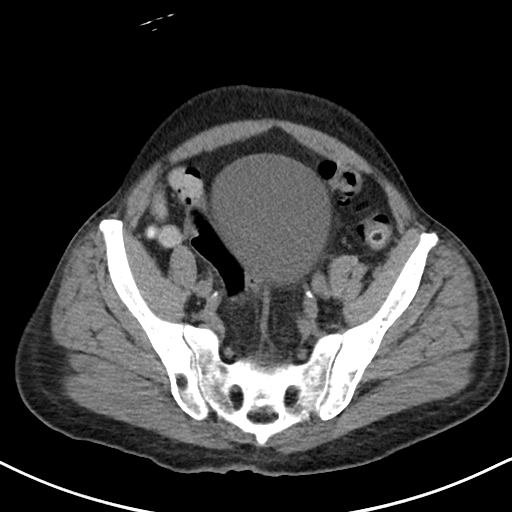
[im 41/91  soft-tissue]
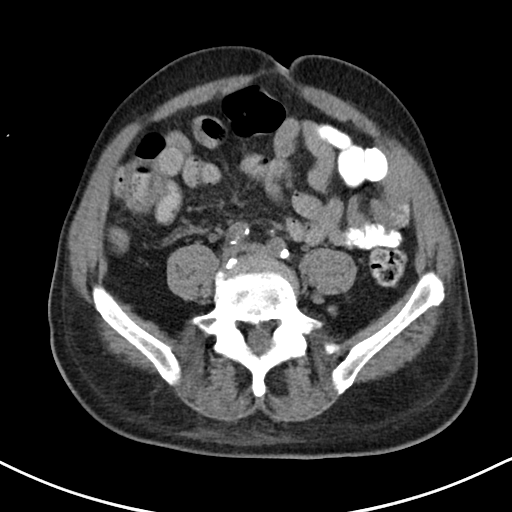
[im 46/91  soft-tissue]
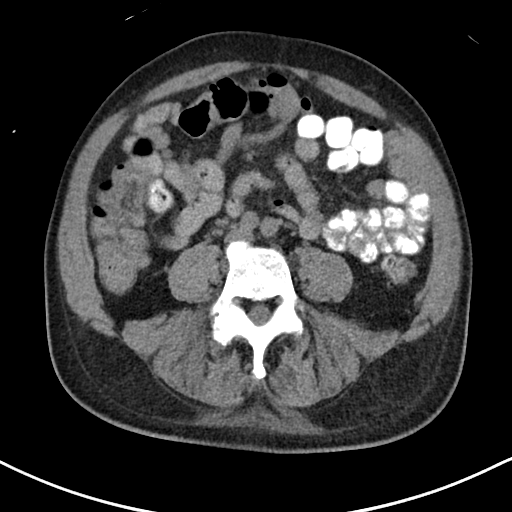
[im 51/91  soft-tissue]
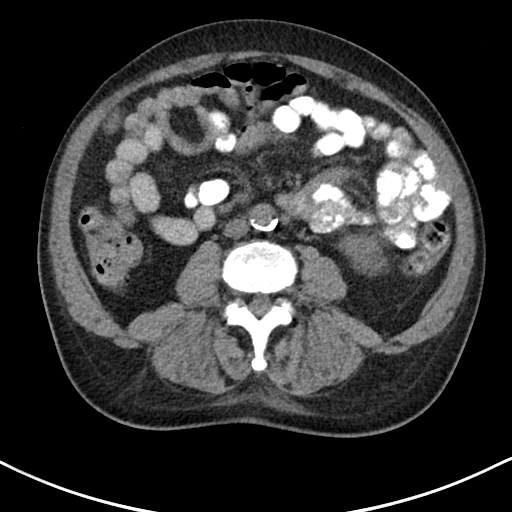
[im 61/91  soft-tissue]
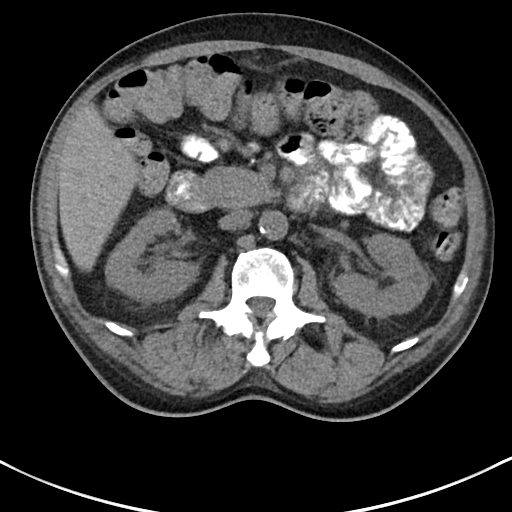
[im 61/91  bone]
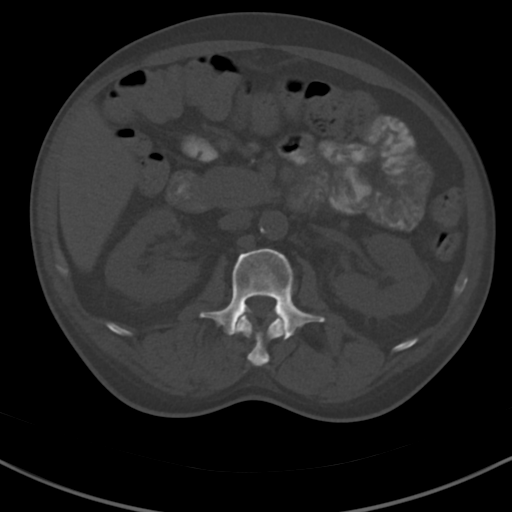
[im 66/91  soft-tissue]
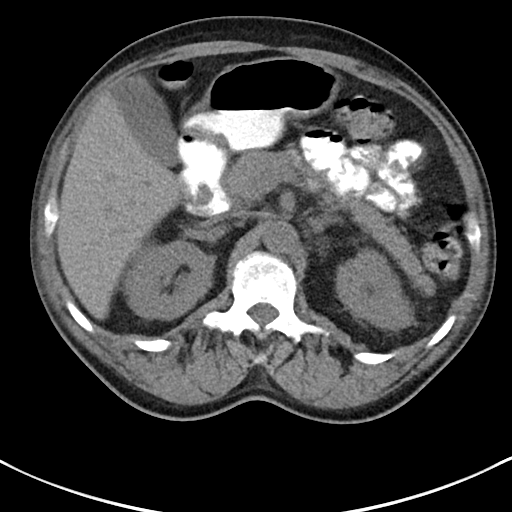
[im 71/91  soft-tissue]
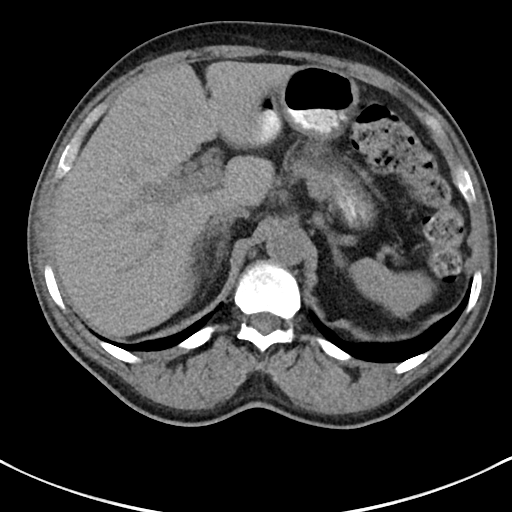
[im 81/91  soft-tissue]
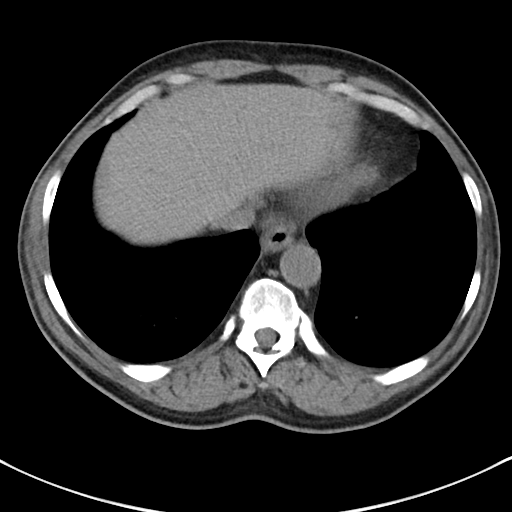
[im 86/91  soft-tissue]
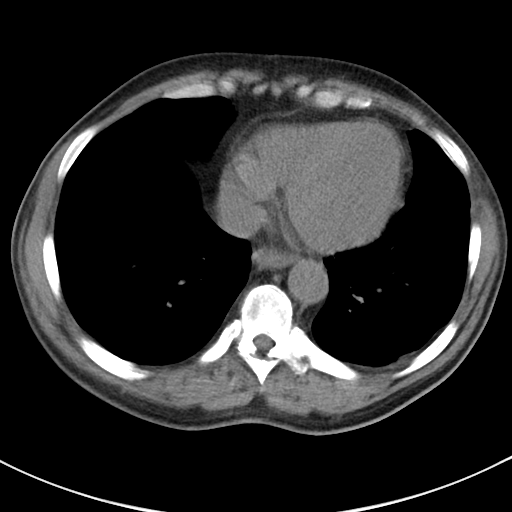

[Series 6: cor · coronal · 0.65mm/px · 3 of 87 slices shown]
[im 29/87  soft-tissue]
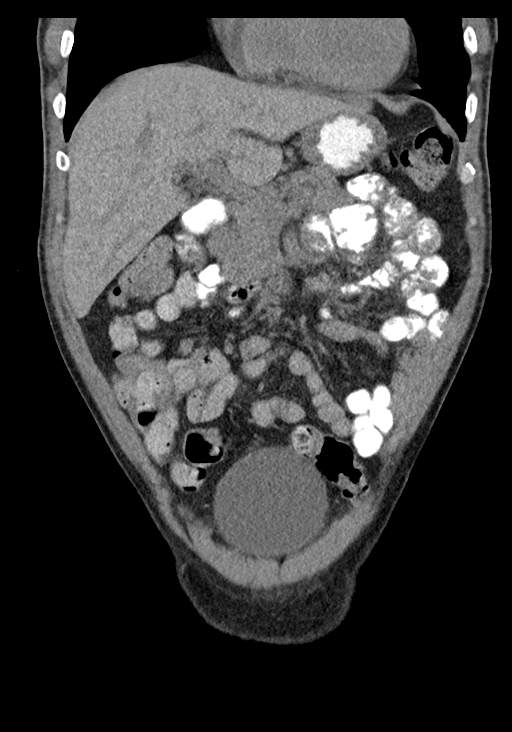
[im 39/87  soft-tissue]
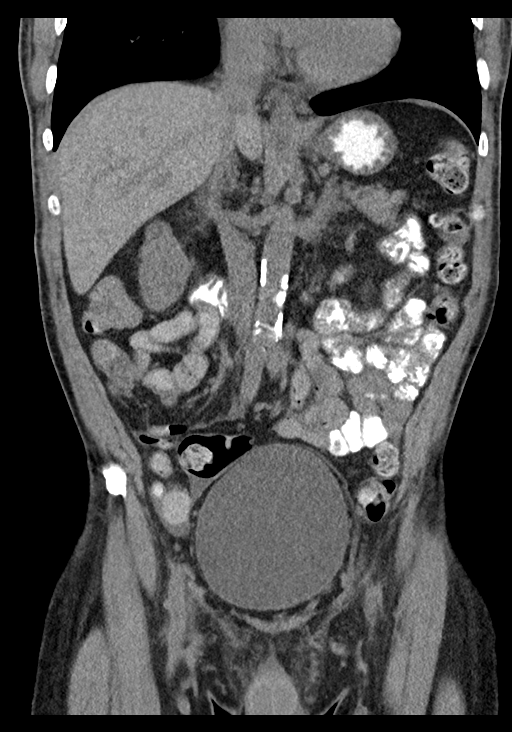
[im 48/87  soft-tissue]
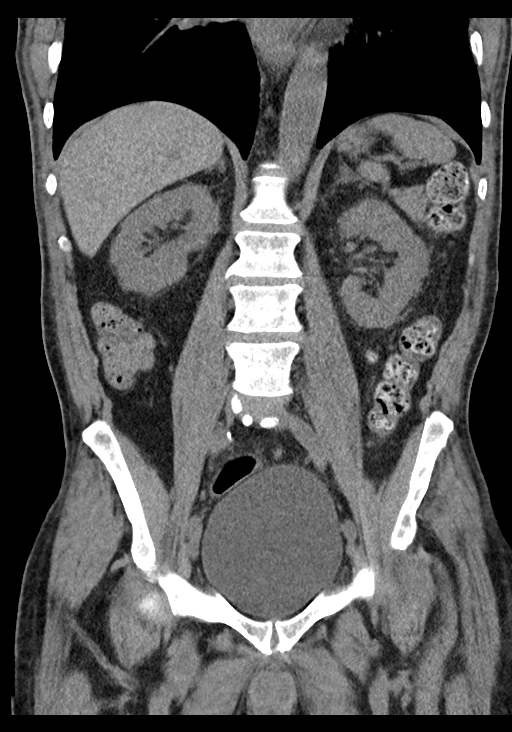

[16 of 46 positions shown; findings below may reference images not displayed]

FINDINGS: Lower chest: Lung bases are clear. No pleural or pericardial
effusion.

Hepatobiliary: No focal liver abnormality is seen. No gallstones,
gallbladder wall thickening, or biliary dilatation.

Pancreas: Unremarkable. No pancreatic ductal dilatation or
surrounding inflammatory changes.

Spleen: Normal in size without focal abnormality.

Adrenals/Urinary Tract: Adrenal glands are unremarkable. Kidneys are
normal, without renal calculi, focal lesion, or hydronephrosis.
Bladder is unremarkable.

Stomach/Bowel: A few scattered colonic diverticula are noted. No
diverticulitis. The appendix, stomach and small bowel appear normal.

Vascular/Lymphatic: Aortic atherosclerosis. No enlarged abdominal or
pelvic lymph nodes.

Reproductive: Prostate is unremarkable.

Other: A trace amount of fluid is seen in the right lower quadrant
adjacent to a loop of small bowel.

Musculoskeletal: No fracture or focal lesion. Degenerative disc
disease L5-S1 noted.
IMPRESSION: Trace amount of fluid in the right lower quadrant adjacent to a loop
of distal ileum may be due to enteritis. No other finding to suggest
acute abnormality is identified.

Atherosclerosis.

Mild diverticulosis without diverticulitis.

## 2019-05-19 DIAGNOSIS — D689 Coagulation defect, unspecified: Secondary | ICD-10-CM | POA: Diagnosis not present

## 2019-05-19 DIAGNOSIS — N186 End stage renal disease: Secondary | ICD-10-CM | POA: Diagnosis not present

## 2019-05-19 DIAGNOSIS — E1129 Type 2 diabetes mellitus with other diabetic kidney complication: Secondary | ICD-10-CM | POA: Diagnosis not present

## 2019-05-19 DIAGNOSIS — Z992 Dependence on renal dialysis: Secondary | ICD-10-CM | POA: Diagnosis not present

## 2019-05-19 DIAGNOSIS — N2581 Secondary hyperparathyroidism of renal origin: Secondary | ICD-10-CM | POA: Diagnosis not present

## 2019-05-19 DIAGNOSIS — D509 Iron deficiency anemia, unspecified: Secondary | ICD-10-CM | POA: Diagnosis not present

## 2019-05-22 DIAGNOSIS — Z992 Dependence on renal dialysis: Secondary | ICD-10-CM | POA: Diagnosis not present

## 2019-05-22 DIAGNOSIS — N186 End stage renal disease: Secondary | ICD-10-CM | POA: Diagnosis not present

## 2019-05-22 DIAGNOSIS — E1129 Type 2 diabetes mellitus with other diabetic kidney complication: Secondary | ICD-10-CM | POA: Diagnosis not present

## 2019-05-22 DIAGNOSIS — N2581 Secondary hyperparathyroidism of renal origin: Secondary | ICD-10-CM | POA: Diagnosis not present

## 2019-05-22 DIAGNOSIS — D509 Iron deficiency anemia, unspecified: Secondary | ICD-10-CM | POA: Diagnosis not present

## 2019-05-22 DIAGNOSIS — D689 Coagulation defect, unspecified: Secondary | ICD-10-CM | POA: Diagnosis not present

## 2019-05-24 DIAGNOSIS — Z992 Dependence on renal dialysis: Secondary | ICD-10-CM | POA: Diagnosis not present

## 2019-05-24 DIAGNOSIS — N186 End stage renal disease: Secondary | ICD-10-CM | POA: Diagnosis not present

## 2019-05-24 DIAGNOSIS — E1129 Type 2 diabetes mellitus with other diabetic kidney complication: Secondary | ICD-10-CM | POA: Diagnosis not present

## 2019-05-24 DIAGNOSIS — D509 Iron deficiency anemia, unspecified: Secondary | ICD-10-CM | POA: Diagnosis not present

## 2019-05-24 DIAGNOSIS — N2581 Secondary hyperparathyroidism of renal origin: Secondary | ICD-10-CM | POA: Diagnosis not present

## 2019-05-24 DIAGNOSIS — D689 Coagulation defect, unspecified: Secondary | ICD-10-CM | POA: Diagnosis not present

## 2019-05-26 DIAGNOSIS — Z992 Dependence on renal dialysis: Secondary | ICD-10-CM | POA: Diagnosis not present

## 2019-05-26 DIAGNOSIS — D509 Iron deficiency anemia, unspecified: Secondary | ICD-10-CM | POA: Diagnosis not present

## 2019-05-26 DIAGNOSIS — N2581 Secondary hyperparathyroidism of renal origin: Secondary | ICD-10-CM | POA: Diagnosis not present

## 2019-05-26 DIAGNOSIS — E1129 Type 2 diabetes mellitus with other diabetic kidney complication: Secondary | ICD-10-CM | POA: Diagnosis not present

## 2019-05-26 DIAGNOSIS — N186 End stage renal disease: Secondary | ICD-10-CM | POA: Diagnosis not present

## 2019-05-26 DIAGNOSIS — D689 Coagulation defect, unspecified: Secondary | ICD-10-CM | POA: Diagnosis not present

## 2019-05-29 DIAGNOSIS — D689 Coagulation defect, unspecified: Secondary | ICD-10-CM | POA: Diagnosis not present

## 2019-05-29 DIAGNOSIS — E1129 Type 2 diabetes mellitus with other diabetic kidney complication: Secondary | ICD-10-CM | POA: Diagnosis not present

## 2019-05-29 DIAGNOSIS — N2581 Secondary hyperparathyroidism of renal origin: Secondary | ICD-10-CM | POA: Diagnosis not present

## 2019-05-29 DIAGNOSIS — D509 Iron deficiency anemia, unspecified: Secondary | ICD-10-CM | POA: Diagnosis not present

## 2019-05-29 DIAGNOSIS — N186 End stage renal disease: Secondary | ICD-10-CM | POA: Diagnosis not present

## 2019-05-29 DIAGNOSIS — Z992 Dependence on renal dialysis: Secondary | ICD-10-CM | POA: Diagnosis not present

## 2019-05-31 DIAGNOSIS — E1129 Type 2 diabetes mellitus with other diabetic kidney complication: Secondary | ICD-10-CM | POA: Diagnosis not present

## 2019-05-31 DIAGNOSIS — D689 Coagulation defect, unspecified: Secondary | ICD-10-CM | POA: Diagnosis not present

## 2019-05-31 DIAGNOSIS — N2581 Secondary hyperparathyroidism of renal origin: Secondary | ICD-10-CM | POA: Diagnosis not present

## 2019-05-31 DIAGNOSIS — N186 End stage renal disease: Secondary | ICD-10-CM | POA: Diagnosis not present

## 2019-05-31 DIAGNOSIS — D509 Iron deficiency anemia, unspecified: Secondary | ICD-10-CM | POA: Diagnosis not present

## 2019-05-31 DIAGNOSIS — Z992 Dependence on renal dialysis: Secondary | ICD-10-CM | POA: Diagnosis not present

## 2019-06-01 ENCOUNTER — Other Ambulatory Visit: Payer: Self-pay | Admitting: Internal Medicine

## 2019-06-02 DIAGNOSIS — N2581 Secondary hyperparathyroidism of renal origin: Secondary | ICD-10-CM | POA: Diagnosis not present

## 2019-06-02 DIAGNOSIS — N186 End stage renal disease: Secondary | ICD-10-CM | POA: Diagnosis not present

## 2019-06-02 DIAGNOSIS — D689 Coagulation defect, unspecified: Secondary | ICD-10-CM | POA: Diagnosis not present

## 2019-06-02 DIAGNOSIS — E1129 Type 2 diabetes mellitus with other diabetic kidney complication: Secondary | ICD-10-CM | POA: Diagnosis not present

## 2019-06-02 DIAGNOSIS — Z992 Dependence on renal dialysis: Secondary | ICD-10-CM | POA: Diagnosis not present

## 2019-06-02 DIAGNOSIS — D509 Iron deficiency anemia, unspecified: Secondary | ICD-10-CM | POA: Diagnosis not present

## 2019-06-04 DIAGNOSIS — D689 Coagulation defect, unspecified: Secondary | ICD-10-CM | POA: Diagnosis not present

## 2019-06-04 DIAGNOSIS — E1129 Type 2 diabetes mellitus with other diabetic kidney complication: Secondary | ICD-10-CM | POA: Diagnosis not present

## 2019-06-04 DIAGNOSIS — Z992 Dependence on renal dialysis: Secondary | ICD-10-CM | POA: Diagnosis not present

## 2019-06-04 DIAGNOSIS — N186 End stage renal disease: Secondary | ICD-10-CM | POA: Diagnosis not present

## 2019-06-04 DIAGNOSIS — D509 Iron deficiency anemia, unspecified: Secondary | ICD-10-CM | POA: Diagnosis not present

## 2019-06-04 DIAGNOSIS — N2581 Secondary hyperparathyroidism of renal origin: Secondary | ICD-10-CM | POA: Diagnosis not present

## 2019-06-06 DIAGNOSIS — N186 End stage renal disease: Secondary | ICD-10-CM | POA: Diagnosis not present

## 2019-06-06 DIAGNOSIS — D689 Coagulation defect, unspecified: Secondary | ICD-10-CM | POA: Diagnosis not present

## 2019-06-06 DIAGNOSIS — Z992 Dependence on renal dialysis: Secondary | ICD-10-CM | POA: Diagnosis not present

## 2019-06-06 DIAGNOSIS — E1129 Type 2 diabetes mellitus with other diabetic kidney complication: Secondary | ICD-10-CM | POA: Diagnosis not present

## 2019-06-06 DIAGNOSIS — D509 Iron deficiency anemia, unspecified: Secondary | ICD-10-CM | POA: Diagnosis not present

## 2019-06-06 DIAGNOSIS — N2581 Secondary hyperparathyroidism of renal origin: Secondary | ICD-10-CM | POA: Diagnosis not present

## 2019-06-09 DIAGNOSIS — N2581 Secondary hyperparathyroidism of renal origin: Secondary | ICD-10-CM | POA: Diagnosis not present

## 2019-06-09 DIAGNOSIS — N186 End stage renal disease: Secondary | ICD-10-CM | POA: Diagnosis not present

## 2019-06-09 DIAGNOSIS — D509 Iron deficiency anemia, unspecified: Secondary | ICD-10-CM | POA: Diagnosis not present

## 2019-06-09 DIAGNOSIS — Z992 Dependence on renal dialysis: Secondary | ICD-10-CM | POA: Diagnosis not present

## 2019-06-09 DIAGNOSIS — D689 Coagulation defect, unspecified: Secondary | ICD-10-CM | POA: Diagnosis not present

## 2019-06-09 DIAGNOSIS — E1129 Type 2 diabetes mellitus with other diabetic kidney complication: Secondary | ICD-10-CM | POA: Diagnosis not present

## 2019-06-11 DIAGNOSIS — E1122 Type 2 diabetes mellitus with diabetic chronic kidney disease: Secondary | ICD-10-CM | POA: Diagnosis not present

## 2019-06-11 DIAGNOSIS — Z992 Dependence on renal dialysis: Secondary | ICD-10-CM | POA: Diagnosis not present

## 2019-06-11 DIAGNOSIS — N186 End stage renal disease: Secondary | ICD-10-CM | POA: Diagnosis not present

## 2019-06-12 DIAGNOSIS — D689 Coagulation defect, unspecified: Secondary | ICD-10-CM | POA: Diagnosis not present

## 2019-06-12 DIAGNOSIS — E1129 Type 2 diabetes mellitus with other diabetic kidney complication: Secondary | ICD-10-CM | POA: Diagnosis not present

## 2019-06-12 DIAGNOSIS — Z992 Dependence on renal dialysis: Secondary | ICD-10-CM | POA: Diagnosis not present

## 2019-06-12 DIAGNOSIS — D631 Anemia in chronic kidney disease: Secondary | ICD-10-CM | POA: Diagnosis not present

## 2019-06-12 DIAGNOSIS — N186 End stage renal disease: Secondary | ICD-10-CM | POA: Diagnosis not present

## 2019-06-12 DIAGNOSIS — N2581 Secondary hyperparathyroidism of renal origin: Secondary | ICD-10-CM | POA: Diagnosis not present

## 2019-06-14 DIAGNOSIS — Z992 Dependence on renal dialysis: Secondary | ICD-10-CM | POA: Diagnosis not present

## 2019-06-14 DIAGNOSIS — D631 Anemia in chronic kidney disease: Secondary | ICD-10-CM | POA: Diagnosis not present

## 2019-06-14 DIAGNOSIS — N2581 Secondary hyperparathyroidism of renal origin: Secondary | ICD-10-CM | POA: Diagnosis not present

## 2019-06-14 DIAGNOSIS — D689 Coagulation defect, unspecified: Secondary | ICD-10-CM | POA: Diagnosis not present

## 2019-06-14 DIAGNOSIS — N186 End stage renal disease: Secondary | ICD-10-CM | POA: Diagnosis not present

## 2019-06-14 DIAGNOSIS — E1129 Type 2 diabetes mellitus with other diabetic kidney complication: Secondary | ICD-10-CM | POA: Diagnosis not present

## 2019-06-16 DIAGNOSIS — D689 Coagulation defect, unspecified: Secondary | ICD-10-CM | POA: Diagnosis not present

## 2019-06-16 DIAGNOSIS — Z992 Dependence on renal dialysis: Secondary | ICD-10-CM | POA: Diagnosis not present

## 2019-06-16 DIAGNOSIS — D631 Anemia in chronic kidney disease: Secondary | ICD-10-CM | POA: Diagnosis not present

## 2019-06-16 DIAGNOSIS — N186 End stage renal disease: Secondary | ICD-10-CM | POA: Diagnosis not present

## 2019-06-16 DIAGNOSIS — N2581 Secondary hyperparathyroidism of renal origin: Secondary | ICD-10-CM | POA: Diagnosis not present

## 2019-06-16 DIAGNOSIS — E1129 Type 2 diabetes mellitus with other diabetic kidney complication: Secondary | ICD-10-CM | POA: Diagnosis not present

## 2019-06-19 DIAGNOSIS — N2581 Secondary hyperparathyroidism of renal origin: Secondary | ICD-10-CM | POA: Diagnosis not present

## 2019-06-19 DIAGNOSIS — E1129 Type 2 diabetes mellitus with other diabetic kidney complication: Secondary | ICD-10-CM | POA: Diagnosis not present

## 2019-06-19 DIAGNOSIS — Z992 Dependence on renal dialysis: Secondary | ICD-10-CM | POA: Diagnosis not present

## 2019-06-19 DIAGNOSIS — D631 Anemia in chronic kidney disease: Secondary | ICD-10-CM | POA: Diagnosis not present

## 2019-06-19 DIAGNOSIS — D689 Coagulation defect, unspecified: Secondary | ICD-10-CM | POA: Diagnosis not present

## 2019-06-19 DIAGNOSIS — N186 End stage renal disease: Secondary | ICD-10-CM | POA: Diagnosis not present

## 2019-06-20 ENCOUNTER — Ambulatory Visit: Payer: Medicare Other | Admitting: Podiatry

## 2019-06-20 ENCOUNTER — Other Ambulatory Visit: Payer: Self-pay

## 2019-06-20 ENCOUNTER — Encounter: Payer: Self-pay | Admitting: Podiatry

## 2019-06-20 DIAGNOSIS — Q828 Other specified congenital malformations of skin: Secondary | ICD-10-CM

## 2019-06-20 DIAGNOSIS — B351 Tinea unguium: Secondary | ICD-10-CM

## 2019-06-20 DIAGNOSIS — E1142 Type 2 diabetes mellitus with diabetic polyneuropathy: Secondary | ICD-10-CM

## 2019-06-20 DIAGNOSIS — M79676 Pain in unspecified toe(s): Secondary | ICD-10-CM | POA: Diagnosis not present

## 2019-06-20 NOTE — Progress Notes (Signed)
Complaint:  Visit Type: Patient returns to my office for continued preventative foot care services. Complaint: Patient states" my nails have grown long and thick and become painful to walk and wear shoes" Patient has been diagnosed with DM with ESRD. The patient presents for preventative foot care services. No changes to ROS.  Patient has not been seen for over 5 months.  Patient presents with his son for visit.  Podiatric Exam: Vascular: dorsalis pedis and posterior tibial pulses are palpable bilateral. Capillary return is immediate. Temperature gradient is WNL. Skin turgor WNL  Sensorium: Diminished.  Semmes Weinstein monofilament test. Normal tactile sensation bilaterally. Nail Exam: Pt has thick disfigured discolored nails with subungual debris noted bilateral entire nail hallux through fifth toenails Ulcer Exam: There is no evidence of ulcer or pre-ulcerative changes or infection. Orthopedic Exam: Muscle tone and strength are WNL. No limitations in general ROM. No crepitus or effusions noted. Foot type and digits show no abnormalities. Bony prominences are unremarkable. Skin:  Porokeratosis sub 4  B/L. No infection or ulcers.  Keratosis  Lichenoides  Chronica  Diagnosis:  Onychomycosis, , Pain in right toe, pain in left toes  Porokeratosis  B/L.  Treatment & Plan Procedures and Treatment: Consent by patient was obtained for treatment procedures.   Debridement of mycotic and hypertrophic toenails, 1 through 5 bilateral and clearing of subungual debris. No ulceration, no infection noted. Debride porokeratosis  B/l. Cauterize skin 3rd toe left foot. Return Visit-Office Procedure: Patient instructed to return to the office for a follow up visit 10 weeks for continued evaluation and treatment.    Gardiner Barefoot DPM

## 2019-06-21 DIAGNOSIS — N186 End stage renal disease: Secondary | ICD-10-CM | POA: Diagnosis not present

## 2019-06-21 DIAGNOSIS — D631 Anemia in chronic kidney disease: Secondary | ICD-10-CM | POA: Diagnosis not present

## 2019-06-21 DIAGNOSIS — D689 Coagulation defect, unspecified: Secondary | ICD-10-CM | POA: Diagnosis not present

## 2019-06-21 DIAGNOSIS — Z992 Dependence on renal dialysis: Secondary | ICD-10-CM | POA: Diagnosis not present

## 2019-06-21 DIAGNOSIS — E1129 Type 2 diabetes mellitus with other diabetic kidney complication: Secondary | ICD-10-CM | POA: Diagnosis not present

## 2019-06-21 DIAGNOSIS — N2581 Secondary hyperparathyroidism of renal origin: Secondary | ICD-10-CM | POA: Diagnosis not present

## 2019-06-23 DIAGNOSIS — N2581 Secondary hyperparathyroidism of renal origin: Secondary | ICD-10-CM | POA: Diagnosis not present

## 2019-06-23 DIAGNOSIS — D689 Coagulation defect, unspecified: Secondary | ICD-10-CM | POA: Diagnosis not present

## 2019-06-23 DIAGNOSIS — Z992 Dependence on renal dialysis: Secondary | ICD-10-CM | POA: Diagnosis not present

## 2019-06-23 DIAGNOSIS — N186 End stage renal disease: Secondary | ICD-10-CM | POA: Diagnosis not present

## 2019-06-23 DIAGNOSIS — D631 Anemia in chronic kidney disease: Secondary | ICD-10-CM | POA: Diagnosis not present

## 2019-06-23 DIAGNOSIS — E1129 Type 2 diabetes mellitus with other diabetic kidney complication: Secondary | ICD-10-CM | POA: Diagnosis not present

## 2019-06-26 DIAGNOSIS — Z992 Dependence on renal dialysis: Secondary | ICD-10-CM | POA: Diagnosis not present

## 2019-06-26 DIAGNOSIS — D689 Coagulation defect, unspecified: Secondary | ICD-10-CM | POA: Diagnosis not present

## 2019-06-26 DIAGNOSIS — N2581 Secondary hyperparathyroidism of renal origin: Secondary | ICD-10-CM | POA: Diagnosis not present

## 2019-06-26 DIAGNOSIS — D631 Anemia in chronic kidney disease: Secondary | ICD-10-CM | POA: Diagnosis not present

## 2019-06-26 DIAGNOSIS — N186 End stage renal disease: Secondary | ICD-10-CM | POA: Diagnosis not present

## 2019-06-26 DIAGNOSIS — E1129 Type 2 diabetes mellitus with other diabetic kidney complication: Secondary | ICD-10-CM | POA: Diagnosis not present

## 2019-06-28 DIAGNOSIS — D689 Coagulation defect, unspecified: Secondary | ICD-10-CM | POA: Diagnosis not present

## 2019-06-28 DIAGNOSIS — N186 End stage renal disease: Secondary | ICD-10-CM | POA: Diagnosis not present

## 2019-06-28 DIAGNOSIS — D631 Anemia in chronic kidney disease: Secondary | ICD-10-CM | POA: Diagnosis not present

## 2019-06-28 DIAGNOSIS — E1129 Type 2 diabetes mellitus with other diabetic kidney complication: Secondary | ICD-10-CM | POA: Diagnosis not present

## 2019-06-28 DIAGNOSIS — Z992 Dependence on renal dialysis: Secondary | ICD-10-CM | POA: Diagnosis not present

## 2019-06-28 DIAGNOSIS — N2581 Secondary hyperparathyroidism of renal origin: Secondary | ICD-10-CM | POA: Diagnosis not present

## 2019-06-30 DIAGNOSIS — N2581 Secondary hyperparathyroidism of renal origin: Secondary | ICD-10-CM | POA: Diagnosis not present

## 2019-06-30 DIAGNOSIS — N186 End stage renal disease: Secondary | ICD-10-CM | POA: Diagnosis not present

## 2019-06-30 DIAGNOSIS — E1129 Type 2 diabetes mellitus with other diabetic kidney complication: Secondary | ICD-10-CM | POA: Diagnosis not present

## 2019-06-30 DIAGNOSIS — D689 Coagulation defect, unspecified: Secondary | ICD-10-CM | POA: Diagnosis not present

## 2019-06-30 DIAGNOSIS — Z992 Dependence on renal dialysis: Secondary | ICD-10-CM | POA: Diagnosis not present

## 2019-06-30 DIAGNOSIS — D631 Anemia in chronic kidney disease: Secondary | ICD-10-CM | POA: Diagnosis not present

## 2019-07-03 DIAGNOSIS — N2581 Secondary hyperparathyroidism of renal origin: Secondary | ICD-10-CM | POA: Diagnosis not present

## 2019-07-03 DIAGNOSIS — Z992 Dependence on renal dialysis: Secondary | ICD-10-CM | POA: Diagnosis not present

## 2019-07-03 DIAGNOSIS — D689 Coagulation defect, unspecified: Secondary | ICD-10-CM | POA: Diagnosis not present

## 2019-07-03 DIAGNOSIS — E1129 Type 2 diabetes mellitus with other diabetic kidney complication: Secondary | ICD-10-CM | POA: Diagnosis not present

## 2019-07-03 DIAGNOSIS — N186 End stage renal disease: Secondary | ICD-10-CM | POA: Diagnosis not present

## 2019-07-03 DIAGNOSIS — D631 Anemia in chronic kidney disease: Secondary | ICD-10-CM | POA: Diagnosis not present

## 2019-07-05 DIAGNOSIS — E1129 Type 2 diabetes mellitus with other diabetic kidney complication: Secondary | ICD-10-CM | POA: Diagnosis not present

## 2019-07-05 DIAGNOSIS — N2581 Secondary hyperparathyroidism of renal origin: Secondary | ICD-10-CM | POA: Diagnosis not present

## 2019-07-05 DIAGNOSIS — D689 Coagulation defect, unspecified: Secondary | ICD-10-CM | POA: Diagnosis not present

## 2019-07-05 DIAGNOSIS — N186 End stage renal disease: Secondary | ICD-10-CM | POA: Diagnosis not present

## 2019-07-05 DIAGNOSIS — Z992 Dependence on renal dialysis: Secondary | ICD-10-CM | POA: Diagnosis not present

## 2019-07-05 DIAGNOSIS — D631 Anemia in chronic kidney disease: Secondary | ICD-10-CM | POA: Diagnosis not present

## 2019-07-06 ENCOUNTER — Other Ambulatory Visit: Payer: Self-pay | Admitting: Internal Medicine

## 2019-07-08 DIAGNOSIS — D689 Coagulation defect, unspecified: Secondary | ICD-10-CM | POA: Diagnosis not present

## 2019-07-08 DIAGNOSIS — N2581 Secondary hyperparathyroidism of renal origin: Secondary | ICD-10-CM | POA: Diagnosis not present

## 2019-07-08 DIAGNOSIS — E1129 Type 2 diabetes mellitus with other diabetic kidney complication: Secondary | ICD-10-CM | POA: Diagnosis not present

## 2019-07-08 DIAGNOSIS — D631 Anemia in chronic kidney disease: Secondary | ICD-10-CM | POA: Diagnosis not present

## 2019-07-08 DIAGNOSIS — N186 End stage renal disease: Secondary | ICD-10-CM | POA: Diagnosis not present

## 2019-07-08 DIAGNOSIS — Z992 Dependence on renal dialysis: Secondary | ICD-10-CM | POA: Diagnosis not present

## 2019-07-08 NOTE — Telephone Encounter (Signed)
Ok per office refill policy

## 2019-07-10 DIAGNOSIS — E1129 Type 2 diabetes mellitus with other diabetic kidney complication: Secondary | ICD-10-CM | POA: Diagnosis not present

## 2019-07-10 DIAGNOSIS — N186 End stage renal disease: Secondary | ICD-10-CM | POA: Diagnosis not present

## 2019-07-10 DIAGNOSIS — Z992 Dependence on renal dialysis: Secondary | ICD-10-CM | POA: Diagnosis not present

## 2019-07-10 DIAGNOSIS — D689 Coagulation defect, unspecified: Secondary | ICD-10-CM | POA: Diagnosis not present

## 2019-07-10 DIAGNOSIS — N2581 Secondary hyperparathyroidism of renal origin: Secondary | ICD-10-CM | POA: Diagnosis not present

## 2019-07-10 DIAGNOSIS — D631 Anemia in chronic kidney disease: Secondary | ICD-10-CM | POA: Diagnosis not present

## 2019-07-12 DIAGNOSIS — N186 End stage renal disease: Secondary | ICD-10-CM | POA: Diagnosis not present

## 2019-07-12 DIAGNOSIS — E1129 Type 2 diabetes mellitus with other diabetic kidney complication: Secondary | ICD-10-CM | POA: Diagnosis not present

## 2019-07-12 DIAGNOSIS — E1122 Type 2 diabetes mellitus with diabetic chronic kidney disease: Secondary | ICD-10-CM | POA: Diagnosis not present

## 2019-07-12 DIAGNOSIS — D631 Anemia in chronic kidney disease: Secondary | ICD-10-CM | POA: Diagnosis not present

## 2019-07-12 DIAGNOSIS — D689 Coagulation defect, unspecified: Secondary | ICD-10-CM | POA: Diagnosis not present

## 2019-07-12 DIAGNOSIS — Z992 Dependence on renal dialysis: Secondary | ICD-10-CM | POA: Diagnosis not present

## 2019-07-12 DIAGNOSIS — N2581 Secondary hyperparathyroidism of renal origin: Secondary | ICD-10-CM | POA: Diagnosis not present

## 2019-07-15 DIAGNOSIS — D631 Anemia in chronic kidney disease: Secondary | ICD-10-CM | POA: Diagnosis not present

## 2019-07-15 DIAGNOSIS — N2581 Secondary hyperparathyroidism of renal origin: Secondary | ICD-10-CM | POA: Diagnosis not present

## 2019-07-15 DIAGNOSIS — E1129 Type 2 diabetes mellitus with other diabetic kidney complication: Secondary | ICD-10-CM | POA: Diagnosis not present

## 2019-07-15 DIAGNOSIS — D689 Coagulation defect, unspecified: Secondary | ICD-10-CM | POA: Diagnosis not present

## 2019-07-15 DIAGNOSIS — N186 End stage renal disease: Secondary | ICD-10-CM | POA: Diagnosis not present

## 2019-07-15 DIAGNOSIS — Z992 Dependence on renal dialysis: Secondary | ICD-10-CM | POA: Diagnosis not present

## 2019-07-17 DIAGNOSIS — N186 End stage renal disease: Secondary | ICD-10-CM | POA: Diagnosis not present

## 2019-07-17 DIAGNOSIS — E1129 Type 2 diabetes mellitus with other diabetic kidney complication: Secondary | ICD-10-CM | POA: Diagnosis not present

## 2019-07-17 DIAGNOSIS — D631 Anemia in chronic kidney disease: Secondary | ICD-10-CM | POA: Diagnosis not present

## 2019-07-17 DIAGNOSIS — N2581 Secondary hyperparathyroidism of renal origin: Secondary | ICD-10-CM | POA: Diagnosis not present

## 2019-07-17 DIAGNOSIS — Z992 Dependence on renal dialysis: Secondary | ICD-10-CM | POA: Diagnosis not present

## 2019-07-17 DIAGNOSIS — D689 Coagulation defect, unspecified: Secondary | ICD-10-CM | POA: Diagnosis not present

## 2019-07-19 DIAGNOSIS — D631 Anemia in chronic kidney disease: Secondary | ICD-10-CM | POA: Diagnosis not present

## 2019-07-19 DIAGNOSIS — E1129 Type 2 diabetes mellitus with other diabetic kidney complication: Secondary | ICD-10-CM | POA: Diagnosis not present

## 2019-07-19 DIAGNOSIS — D689 Coagulation defect, unspecified: Secondary | ICD-10-CM | POA: Diagnosis not present

## 2019-07-19 DIAGNOSIS — N186 End stage renal disease: Secondary | ICD-10-CM | POA: Diagnosis not present

## 2019-07-19 DIAGNOSIS — Z992 Dependence on renal dialysis: Secondary | ICD-10-CM | POA: Diagnosis not present

## 2019-07-19 DIAGNOSIS — N2581 Secondary hyperparathyroidism of renal origin: Secondary | ICD-10-CM | POA: Diagnosis not present

## 2019-07-21 DIAGNOSIS — N186 End stage renal disease: Secondary | ICD-10-CM | POA: Diagnosis not present

## 2019-07-21 DIAGNOSIS — N2581 Secondary hyperparathyroidism of renal origin: Secondary | ICD-10-CM | POA: Diagnosis not present

## 2019-07-21 DIAGNOSIS — E1129 Type 2 diabetes mellitus with other diabetic kidney complication: Secondary | ICD-10-CM | POA: Diagnosis not present

## 2019-07-21 DIAGNOSIS — D631 Anemia in chronic kidney disease: Secondary | ICD-10-CM | POA: Diagnosis not present

## 2019-07-21 DIAGNOSIS — Z992 Dependence on renal dialysis: Secondary | ICD-10-CM | POA: Diagnosis not present

## 2019-07-21 DIAGNOSIS — D689 Coagulation defect, unspecified: Secondary | ICD-10-CM | POA: Diagnosis not present

## 2019-07-24 DIAGNOSIS — D689 Coagulation defect, unspecified: Secondary | ICD-10-CM | POA: Diagnosis not present

## 2019-07-24 DIAGNOSIS — E1129 Type 2 diabetes mellitus with other diabetic kidney complication: Secondary | ICD-10-CM | POA: Diagnosis not present

## 2019-07-24 DIAGNOSIS — N186 End stage renal disease: Secondary | ICD-10-CM | POA: Diagnosis not present

## 2019-07-24 DIAGNOSIS — N2581 Secondary hyperparathyroidism of renal origin: Secondary | ICD-10-CM | POA: Diagnosis not present

## 2019-07-24 DIAGNOSIS — Z992 Dependence on renal dialysis: Secondary | ICD-10-CM | POA: Diagnosis not present

## 2019-07-24 DIAGNOSIS — D631 Anemia in chronic kidney disease: Secondary | ICD-10-CM | POA: Diagnosis not present

## 2019-07-26 DIAGNOSIS — Z992 Dependence on renal dialysis: Secondary | ICD-10-CM | POA: Diagnosis not present

## 2019-07-26 DIAGNOSIS — E1129 Type 2 diabetes mellitus with other diabetic kidney complication: Secondary | ICD-10-CM | POA: Diagnosis not present

## 2019-07-26 DIAGNOSIS — N2581 Secondary hyperparathyroidism of renal origin: Secondary | ICD-10-CM | POA: Diagnosis not present

## 2019-07-26 DIAGNOSIS — N186 End stage renal disease: Secondary | ICD-10-CM | POA: Diagnosis not present

## 2019-07-26 DIAGNOSIS — D631 Anemia in chronic kidney disease: Secondary | ICD-10-CM | POA: Diagnosis not present

## 2019-07-26 DIAGNOSIS — D689 Coagulation defect, unspecified: Secondary | ICD-10-CM | POA: Diagnosis not present

## 2019-07-28 DIAGNOSIS — Z992 Dependence on renal dialysis: Secondary | ICD-10-CM | POA: Diagnosis not present

## 2019-07-28 DIAGNOSIS — D631 Anemia in chronic kidney disease: Secondary | ICD-10-CM | POA: Diagnosis not present

## 2019-07-28 DIAGNOSIS — D689 Coagulation defect, unspecified: Secondary | ICD-10-CM | POA: Diagnosis not present

## 2019-07-28 DIAGNOSIS — N186 End stage renal disease: Secondary | ICD-10-CM | POA: Diagnosis not present

## 2019-07-28 DIAGNOSIS — N2581 Secondary hyperparathyroidism of renal origin: Secondary | ICD-10-CM | POA: Diagnosis not present

## 2019-07-28 DIAGNOSIS — E1129 Type 2 diabetes mellitus with other diabetic kidney complication: Secondary | ICD-10-CM | POA: Diagnosis not present

## 2019-07-31 DIAGNOSIS — Z992 Dependence on renal dialysis: Secondary | ICD-10-CM | POA: Diagnosis not present

## 2019-07-31 DIAGNOSIS — N186 End stage renal disease: Secondary | ICD-10-CM | POA: Diagnosis not present

## 2019-07-31 DIAGNOSIS — D689 Coagulation defect, unspecified: Secondary | ICD-10-CM | POA: Diagnosis not present

## 2019-07-31 DIAGNOSIS — E1129 Type 2 diabetes mellitus with other diabetic kidney complication: Secondary | ICD-10-CM | POA: Diagnosis not present

## 2019-07-31 DIAGNOSIS — N2581 Secondary hyperparathyroidism of renal origin: Secondary | ICD-10-CM | POA: Diagnosis not present

## 2019-07-31 DIAGNOSIS — D631 Anemia in chronic kidney disease: Secondary | ICD-10-CM | POA: Diagnosis not present

## 2019-08-02 DIAGNOSIS — D631 Anemia in chronic kidney disease: Secondary | ICD-10-CM | POA: Diagnosis not present

## 2019-08-02 DIAGNOSIS — Z992 Dependence on renal dialysis: Secondary | ICD-10-CM | POA: Diagnosis not present

## 2019-08-02 DIAGNOSIS — D689 Coagulation defect, unspecified: Secondary | ICD-10-CM | POA: Diagnosis not present

## 2019-08-02 DIAGNOSIS — N2581 Secondary hyperparathyroidism of renal origin: Secondary | ICD-10-CM | POA: Diagnosis not present

## 2019-08-02 DIAGNOSIS — N186 End stage renal disease: Secondary | ICD-10-CM | POA: Diagnosis not present

## 2019-08-02 DIAGNOSIS — E1129 Type 2 diabetes mellitus with other diabetic kidney complication: Secondary | ICD-10-CM | POA: Diagnosis not present

## 2019-08-03 ENCOUNTER — Telehealth: Payer: Self-pay

## 2019-08-03 NOTE — Telephone Encounter (Signed)
New message    The patient's mom calling needs a note from Dr. Jenny Reichmann saying the patient has dementia reason to close an  account.

## 2019-08-04 DIAGNOSIS — Z992 Dependence on renal dialysis: Secondary | ICD-10-CM | POA: Diagnosis not present

## 2019-08-04 DIAGNOSIS — D689 Coagulation defect, unspecified: Secondary | ICD-10-CM | POA: Diagnosis not present

## 2019-08-04 DIAGNOSIS — N186 End stage renal disease: Secondary | ICD-10-CM | POA: Diagnosis not present

## 2019-08-04 DIAGNOSIS — E1129 Type 2 diabetes mellitus with other diabetic kidney complication: Secondary | ICD-10-CM | POA: Diagnosis not present

## 2019-08-04 DIAGNOSIS — D631 Anemia in chronic kidney disease: Secondary | ICD-10-CM | POA: Diagnosis not present

## 2019-08-04 DIAGNOSIS — N2581 Secondary hyperparathyroidism of renal origin: Secondary | ICD-10-CM | POA: Diagnosis not present

## 2019-08-04 NOTE — Telephone Encounter (Signed)
Please clarify as I am not sure what the mother is asking -  Is she trying to close out a bank account for him?  It may be better to get financial POA for him to do this

## 2019-08-06 NOTE — Telephone Encounter (Signed)
I spoke with patient's mother, Enid Derry who is POA. She states patient is not able to operate his cell phone due to dementia and [employer] needs this letter so she can cancel the plan. She request letter be mailed to pt's home address.  Please advise.

## 2019-08-06 NOTE — Telephone Encounter (Signed)
Called pt mom there was no answer LMOM RTC.Marland KitchenJohny Chess

## 2019-08-06 NOTE — Telephone Encounter (Signed)
F/u   Mom calling back to speak with the nurse.

## 2019-08-07 ENCOUNTER — Encounter: Payer: Self-pay | Admitting: Internal Medicine

## 2019-08-07 DIAGNOSIS — D689 Coagulation defect, unspecified: Secondary | ICD-10-CM | POA: Diagnosis not present

## 2019-08-07 DIAGNOSIS — N186 End stage renal disease: Secondary | ICD-10-CM | POA: Diagnosis not present

## 2019-08-07 DIAGNOSIS — E1129 Type 2 diabetes mellitus with other diabetic kidney complication: Secondary | ICD-10-CM | POA: Diagnosis not present

## 2019-08-07 DIAGNOSIS — D631 Anemia in chronic kidney disease: Secondary | ICD-10-CM | POA: Diagnosis not present

## 2019-08-07 DIAGNOSIS — N2581 Secondary hyperparathyroidism of renal origin: Secondary | ICD-10-CM | POA: Diagnosis not present

## 2019-08-07 DIAGNOSIS — Z992 Dependence on renal dialysis: Secondary | ICD-10-CM | POA: Diagnosis not present

## 2019-08-07 NOTE — Telephone Encounter (Signed)
Letter mailed to pts home address.  

## 2019-08-07 NOTE — Telephone Encounter (Signed)
[place] letter done hardcopy to [employer]

## 2019-08-09 DIAGNOSIS — Z992 Dependence on renal dialysis: Secondary | ICD-10-CM | POA: Diagnosis not present

## 2019-08-09 DIAGNOSIS — N2581 Secondary hyperparathyroidism of renal origin: Secondary | ICD-10-CM | POA: Diagnosis not present

## 2019-08-09 DIAGNOSIS — E1129 Type 2 diabetes mellitus with other diabetic kidney complication: Secondary | ICD-10-CM | POA: Diagnosis not present

## 2019-08-09 DIAGNOSIS — D631 Anemia in chronic kidney disease: Secondary | ICD-10-CM | POA: Diagnosis not present

## 2019-08-09 DIAGNOSIS — N186 End stage renal disease: Secondary | ICD-10-CM | POA: Diagnosis not present

## 2019-08-09 DIAGNOSIS — D689 Coagulation defect, unspecified: Secondary | ICD-10-CM | POA: Diagnosis not present

## 2019-08-11 DIAGNOSIS — N186 End stage renal disease: Secondary | ICD-10-CM | POA: Diagnosis not present

## 2019-08-11 DIAGNOSIS — D689 Coagulation defect, unspecified: Secondary | ICD-10-CM | POA: Diagnosis not present

## 2019-08-11 DIAGNOSIS — E1129 Type 2 diabetes mellitus with other diabetic kidney complication: Secondary | ICD-10-CM | POA: Diagnosis not present

## 2019-08-11 DIAGNOSIS — Z992 Dependence on renal dialysis: Secondary | ICD-10-CM | POA: Diagnosis not present

## 2019-08-11 DIAGNOSIS — D631 Anemia in chronic kidney disease: Secondary | ICD-10-CM | POA: Diagnosis not present

## 2019-08-11 DIAGNOSIS — N2581 Secondary hyperparathyroidism of renal origin: Secondary | ICD-10-CM | POA: Diagnosis not present

## 2019-08-12 DIAGNOSIS — N186 End stage renal disease: Secondary | ICD-10-CM | POA: Diagnosis not present

## 2019-08-12 DIAGNOSIS — Z992 Dependence on renal dialysis: Secondary | ICD-10-CM | POA: Diagnosis not present

## 2019-08-12 DIAGNOSIS — E1122 Type 2 diabetes mellitus with diabetic chronic kidney disease: Secondary | ICD-10-CM | POA: Diagnosis not present

## 2019-08-14 DIAGNOSIS — D509 Iron deficiency anemia, unspecified: Secondary | ICD-10-CM | POA: Diagnosis not present

## 2019-08-14 DIAGNOSIS — D689 Coagulation defect, unspecified: Secondary | ICD-10-CM | POA: Diagnosis not present

## 2019-08-14 DIAGNOSIS — Z992 Dependence on renal dialysis: Secondary | ICD-10-CM | POA: Diagnosis not present

## 2019-08-14 DIAGNOSIS — N186 End stage renal disease: Secondary | ICD-10-CM | POA: Diagnosis not present

## 2019-08-14 DIAGNOSIS — N2581 Secondary hyperparathyroidism of renal origin: Secondary | ICD-10-CM | POA: Diagnosis not present

## 2019-08-16 DIAGNOSIS — D509 Iron deficiency anemia, unspecified: Secondary | ICD-10-CM | POA: Diagnosis not present

## 2019-08-16 DIAGNOSIS — Z992 Dependence on renal dialysis: Secondary | ICD-10-CM | POA: Diagnosis not present

## 2019-08-16 DIAGNOSIS — N186 End stage renal disease: Secondary | ICD-10-CM | POA: Diagnosis not present

## 2019-08-16 DIAGNOSIS — N2581 Secondary hyperparathyroidism of renal origin: Secondary | ICD-10-CM | POA: Diagnosis not present

## 2019-08-16 DIAGNOSIS — D689 Coagulation defect, unspecified: Secondary | ICD-10-CM | POA: Diagnosis not present

## 2019-08-17 ENCOUNTER — Other Ambulatory Visit: Payer: Self-pay | Admitting: Internal Medicine

## 2019-08-18 DIAGNOSIS — D689 Coagulation defect, unspecified: Secondary | ICD-10-CM | POA: Diagnosis not present

## 2019-08-18 DIAGNOSIS — Z992 Dependence on renal dialysis: Secondary | ICD-10-CM | POA: Diagnosis not present

## 2019-08-18 DIAGNOSIS — N186 End stage renal disease: Secondary | ICD-10-CM | POA: Diagnosis not present

## 2019-08-18 DIAGNOSIS — N2581 Secondary hyperparathyroidism of renal origin: Secondary | ICD-10-CM | POA: Diagnosis not present

## 2019-08-18 DIAGNOSIS — D509 Iron deficiency anemia, unspecified: Secondary | ICD-10-CM | POA: Diagnosis not present

## 2019-08-21 DIAGNOSIS — D509 Iron deficiency anemia, unspecified: Secondary | ICD-10-CM | POA: Diagnosis not present

## 2019-08-21 DIAGNOSIS — N186 End stage renal disease: Secondary | ICD-10-CM | POA: Diagnosis not present

## 2019-08-21 DIAGNOSIS — Z992 Dependence on renal dialysis: Secondary | ICD-10-CM | POA: Diagnosis not present

## 2019-08-21 DIAGNOSIS — D689 Coagulation defect, unspecified: Secondary | ICD-10-CM | POA: Diagnosis not present

## 2019-08-21 DIAGNOSIS — N2581 Secondary hyperparathyroidism of renal origin: Secondary | ICD-10-CM | POA: Diagnosis not present

## 2019-08-23 DIAGNOSIS — N2581 Secondary hyperparathyroidism of renal origin: Secondary | ICD-10-CM | POA: Diagnosis not present

## 2019-08-23 DIAGNOSIS — D509 Iron deficiency anemia, unspecified: Secondary | ICD-10-CM | POA: Diagnosis not present

## 2019-08-23 DIAGNOSIS — Z992 Dependence on renal dialysis: Secondary | ICD-10-CM | POA: Diagnosis not present

## 2019-08-23 DIAGNOSIS — D689 Coagulation defect, unspecified: Secondary | ICD-10-CM | POA: Diagnosis not present

## 2019-08-23 DIAGNOSIS — N186 End stage renal disease: Secondary | ICD-10-CM | POA: Diagnosis not present

## 2019-08-25 DIAGNOSIS — Z992 Dependence on renal dialysis: Secondary | ICD-10-CM | POA: Diagnosis not present

## 2019-08-25 DIAGNOSIS — N186 End stage renal disease: Secondary | ICD-10-CM | POA: Diagnosis not present

## 2019-08-25 DIAGNOSIS — N2581 Secondary hyperparathyroidism of renal origin: Secondary | ICD-10-CM | POA: Diagnosis not present

## 2019-08-25 DIAGNOSIS — D689 Coagulation defect, unspecified: Secondary | ICD-10-CM | POA: Diagnosis not present

## 2019-08-25 DIAGNOSIS — D509 Iron deficiency anemia, unspecified: Secondary | ICD-10-CM | POA: Diagnosis not present

## 2019-08-27 DIAGNOSIS — H34211 Partial retinal artery occlusion, right eye: Secondary | ICD-10-CM | POA: Diagnosis not present

## 2019-08-27 DIAGNOSIS — E119 Type 2 diabetes mellitus without complications: Secondary | ICD-10-CM | POA: Diagnosis not present

## 2019-08-27 DIAGNOSIS — H35033 Hypertensive retinopathy, bilateral: Secondary | ICD-10-CM | POA: Diagnosis not present

## 2019-08-27 DIAGNOSIS — H2513 Age-related nuclear cataract, bilateral: Secondary | ICD-10-CM | POA: Diagnosis not present

## 2019-08-27 DIAGNOSIS — H5703 Miosis: Secondary | ICD-10-CM | POA: Diagnosis not present

## 2019-08-28 DIAGNOSIS — D509 Iron deficiency anemia, unspecified: Secondary | ICD-10-CM | POA: Diagnosis not present

## 2019-08-28 DIAGNOSIS — N186 End stage renal disease: Secondary | ICD-10-CM | POA: Diagnosis not present

## 2019-08-28 DIAGNOSIS — Z992 Dependence on renal dialysis: Secondary | ICD-10-CM | POA: Diagnosis not present

## 2019-08-28 DIAGNOSIS — N2581 Secondary hyperparathyroidism of renal origin: Secondary | ICD-10-CM | POA: Diagnosis not present

## 2019-08-28 DIAGNOSIS — D689 Coagulation defect, unspecified: Secondary | ICD-10-CM | POA: Diagnosis not present

## 2019-08-29 ENCOUNTER — Encounter: Payer: Self-pay | Admitting: Podiatry

## 2019-08-29 ENCOUNTER — Other Ambulatory Visit: Payer: Self-pay

## 2019-08-29 ENCOUNTER — Ambulatory Visit: Payer: Medicare Other | Admitting: Podiatry

## 2019-08-29 DIAGNOSIS — E1142 Type 2 diabetes mellitus with diabetic polyneuropathy: Secondary | ICD-10-CM | POA: Diagnosis not present

## 2019-08-29 DIAGNOSIS — B351 Tinea unguium: Secondary | ICD-10-CM | POA: Diagnosis not present

## 2019-08-29 DIAGNOSIS — M79676 Pain in unspecified toe(s): Secondary | ICD-10-CM | POA: Diagnosis not present

## 2019-08-29 DIAGNOSIS — Q828 Other specified congenital malformations of skin: Secondary | ICD-10-CM

## 2019-08-29 NOTE — Progress Notes (Signed)
Complaint:  Visit Type: Patient returns to my office for continued preventative foot care services. Complaint: Patient states" my nails have grown long and thick and become painful to walk and wear shoes" Patient has been diagnosed with DM with ESRD. The patient presents for preventative foot care services. No changes to ROS.    Patient presents with his brother  for visit.  Podiatric Exam: Vascular: dorsalis pedis and posterior tibial pulses are palpable bilateral. Capillary return is immediate. Temperature gradient is WNL. Skin turgor WNL  Sensorium: Diminished.  Semmes Weinstein monofilament test. Normal tactile sensation bilaterally. Nail Exam: Pt has thick disfigured discolored nails with subungual debris noted bilateral entire nail hallux through fifth toenails Ulcer Exam: There is no evidence of ulcer or pre-ulcerative changes or infection. Orthopedic Exam: Muscle tone and strength are WNL. No limitations in general ROM. No crepitus or effusions noted. Foot type and digits show no abnormalities. Bony prominences are unremarkable. Skin:  Porokeratosis sub 4  B/L. No infection or ulcers.  Keratosis  Lichenoides  Chronica  Diagnosis:  Onychomycosis, , Pain in right toe, pain in left toes  Porokeratosis  B/L.  Treatment & Plan Procedures and Treatment: Consent by patient was obtained for treatment procedures.   Debridement of mycotic and hypertrophic toenails, 1 through 5 bilateral and clearing of subungual debris. No ulceration, no infection noted. Debride porokeratosis  B/l. Return Visit-Office Procedure: Patient instructed to return to the office for a follow up visit 10 weeks for continued evaluation and treatment.    Gardiner Barefoot DPM

## 2019-08-30 DIAGNOSIS — N186 End stage renal disease: Secondary | ICD-10-CM | POA: Diagnosis not present

## 2019-08-30 DIAGNOSIS — N2581 Secondary hyperparathyroidism of renal origin: Secondary | ICD-10-CM | POA: Diagnosis not present

## 2019-08-30 DIAGNOSIS — D689 Coagulation defect, unspecified: Secondary | ICD-10-CM | POA: Diagnosis not present

## 2019-08-30 DIAGNOSIS — D509 Iron deficiency anemia, unspecified: Secondary | ICD-10-CM | POA: Diagnosis not present

## 2019-08-30 DIAGNOSIS — Z992 Dependence on renal dialysis: Secondary | ICD-10-CM | POA: Diagnosis not present

## 2019-09-01 DIAGNOSIS — Z992 Dependence on renal dialysis: Secondary | ICD-10-CM | POA: Diagnosis not present

## 2019-09-01 DIAGNOSIS — D689 Coagulation defect, unspecified: Secondary | ICD-10-CM | POA: Diagnosis not present

## 2019-09-01 DIAGNOSIS — D509 Iron deficiency anemia, unspecified: Secondary | ICD-10-CM | POA: Diagnosis not present

## 2019-09-01 DIAGNOSIS — N2581 Secondary hyperparathyroidism of renal origin: Secondary | ICD-10-CM | POA: Diagnosis not present

## 2019-09-01 DIAGNOSIS — N186 End stage renal disease: Secondary | ICD-10-CM | POA: Diagnosis not present

## 2019-09-04 DIAGNOSIS — Z992 Dependence on renal dialysis: Secondary | ICD-10-CM | POA: Diagnosis not present

## 2019-09-04 DIAGNOSIS — N186 End stage renal disease: Secondary | ICD-10-CM | POA: Diagnosis not present

## 2019-09-04 DIAGNOSIS — D509 Iron deficiency anemia, unspecified: Secondary | ICD-10-CM | POA: Diagnosis not present

## 2019-09-04 DIAGNOSIS — N2581 Secondary hyperparathyroidism of renal origin: Secondary | ICD-10-CM | POA: Diagnosis not present

## 2019-09-04 DIAGNOSIS — D689 Coagulation defect, unspecified: Secondary | ICD-10-CM | POA: Diagnosis not present

## 2019-09-06 DIAGNOSIS — D689 Coagulation defect, unspecified: Secondary | ICD-10-CM | POA: Diagnosis not present

## 2019-09-06 DIAGNOSIS — N2581 Secondary hyperparathyroidism of renal origin: Secondary | ICD-10-CM | POA: Diagnosis not present

## 2019-09-06 DIAGNOSIS — Z992 Dependence on renal dialysis: Secondary | ICD-10-CM | POA: Diagnosis not present

## 2019-09-06 DIAGNOSIS — D509 Iron deficiency anemia, unspecified: Secondary | ICD-10-CM | POA: Diagnosis not present

## 2019-09-06 DIAGNOSIS — N186 End stage renal disease: Secondary | ICD-10-CM | POA: Diagnosis not present

## 2019-09-08 DIAGNOSIS — N2581 Secondary hyperparathyroidism of renal origin: Secondary | ICD-10-CM | POA: Diagnosis not present

## 2019-09-08 DIAGNOSIS — Z992 Dependence on renal dialysis: Secondary | ICD-10-CM | POA: Diagnosis not present

## 2019-09-08 DIAGNOSIS — D509 Iron deficiency anemia, unspecified: Secondary | ICD-10-CM | POA: Diagnosis not present

## 2019-09-08 DIAGNOSIS — D689 Coagulation defect, unspecified: Secondary | ICD-10-CM | POA: Diagnosis not present

## 2019-09-08 DIAGNOSIS — N186 End stage renal disease: Secondary | ICD-10-CM | POA: Diagnosis not present

## 2019-09-09 DIAGNOSIS — E1122 Type 2 diabetes mellitus with diabetic chronic kidney disease: Secondary | ICD-10-CM | POA: Diagnosis not present

## 2019-09-09 DIAGNOSIS — N186 End stage renal disease: Secondary | ICD-10-CM | POA: Diagnosis not present

## 2019-09-09 DIAGNOSIS — Z992 Dependence on renal dialysis: Secondary | ICD-10-CM | POA: Diagnosis not present

## 2019-09-11 DIAGNOSIS — D509 Iron deficiency anemia, unspecified: Secondary | ICD-10-CM | POA: Diagnosis not present

## 2019-09-11 DIAGNOSIS — N186 End stage renal disease: Secondary | ICD-10-CM | POA: Diagnosis not present

## 2019-09-11 DIAGNOSIS — D689 Coagulation defect, unspecified: Secondary | ICD-10-CM | POA: Diagnosis not present

## 2019-09-11 DIAGNOSIS — E1129 Type 2 diabetes mellitus with other diabetic kidney complication: Secondary | ICD-10-CM | POA: Diagnosis not present

## 2019-09-11 DIAGNOSIS — Z992 Dependence on renal dialysis: Secondary | ICD-10-CM | POA: Diagnosis not present

## 2019-09-11 DIAGNOSIS — N2581 Secondary hyperparathyroidism of renal origin: Secondary | ICD-10-CM | POA: Diagnosis not present

## 2019-09-12 ENCOUNTER — Telehealth: Payer: Self-pay | Admitting: *Deleted

## 2019-09-12 ENCOUNTER — Encounter: Payer: Self-pay | Admitting: Internal Medicine

## 2019-09-12 ENCOUNTER — Other Ambulatory Visit: Payer: Self-pay

## 2019-09-12 ENCOUNTER — Ambulatory Visit (INDEPENDENT_AMBULATORY_CARE_PROVIDER_SITE_OTHER): Payer: Medicare Other | Admitting: Internal Medicine

## 2019-09-12 VITALS — BP 124/82 | HR 94 | Temp 98.1°F | Ht 72.0 in | Wt 162.0 lb

## 2019-09-12 DIAGNOSIS — Z0000 Encounter for general adult medical examination without abnormal findings: Secondary | ICD-10-CM

## 2019-09-12 DIAGNOSIS — E785 Hyperlipidemia, unspecified: Secondary | ICD-10-CM

## 2019-09-12 DIAGNOSIS — E538 Deficiency of other specified B group vitamins: Secondary | ICD-10-CM

## 2019-09-12 DIAGNOSIS — E559 Vitamin D deficiency, unspecified: Secondary | ICD-10-CM

## 2019-09-12 DIAGNOSIS — E782 Mixed hyperlipidemia: Secondary | ICD-10-CM | POA: Diagnosis not present

## 2019-09-12 DIAGNOSIS — E1129 Type 2 diabetes mellitus with other diabetic kidney complication: Secondary | ICD-10-CM

## 2019-09-12 DIAGNOSIS — E611 Iron deficiency: Secondary | ICD-10-CM

## 2019-09-12 DIAGNOSIS — I10 Essential (primary) hypertension: Secondary | ICD-10-CM

## 2019-09-12 MED ORDER — ROSUVASTATIN CALCIUM 20 MG PO TABS: 20.0000 mg | ORAL_TABLET | Freq: Every day | ORAL | 3 refills | Status: DC

## 2019-09-12 NOTE — Patient Instructions (Signed)
Ok to change the lovastatin to generic for crestor 20 mg per day  Please continue all other medications as before, and refills have been done if requested.  Please have the pharmacy call with any other refills you may need.  Please continue your efforts at being more active, low cholesterol diet, and weight control.  You are otherwise up to date with prevention measures today.  Please keep your appointments with your specialists as you may have planned  Please go to the LAB at the blood drawing area for the tests to be done  You will be contacted by phone if any changes need to be made immediately.  Otherwise, you will receive a letter about your results with an explanation, but please check with MyChart first.  Please remember to sign up for MyChart if you have not done so, as this will be important to you in the future with finding out test results, communicating by private email, and scheduling acute appointments online when needed.  Please make an Appointment to return in 6 months, or sooner if needed

## 2019-09-12 NOTE — Telephone Encounter (Deleted)
I called pt-  there was a lab error with patient's specimens. I asked if patient could come back today to have his lab work re-drawn. He states he will come back today and have his labs re-drawn. He will ask for myself or Tonya.

## 2019-09-12 NOTE — Progress Notes (Signed)
Subjective:    Patient ID: Christopher Lopez, male    DOB: September 25, 1960, 59 y.o.   MRN: [id number]  HPI Here for wellness and f/u;  Overall doing ok;  Pt denies Chest pain, worsening SOB, DOE, wheezing, orthopnea, PND, worsening LE edema, palpitations, dizziness or syncope.  Pt denies neurological change such as new headache, facial or extremity weakness.  Pt denies polydipsia, polyuria, or low sugar symptoms. Pt states overall good compliance with treatment and medications, good tolerability, and has been trying to follow appropriate diet.  Pt denies worsening depressive symptoms, suicidal ideation or panic. No fever, night sweats, wt loss, loss of appetite, or other constitutional symptoms.  Pt states good ability with ADL's, has low fall risk, home safety reviewed and adequate, no other significant changes in hearing or vision, and only occasionally active with exercise. S/p covid vaccine x 1 at HD on T-TH-sat Past Medical History:  Diagnosis Date  . Alcohol abuse 09/18/2011  . Allergic rhinitis, cause unspecified 09/20/2011  . Anemia, unspecified 09/18/2011  . Arthritis   . Childhood asthma 09/20/2011  . CKD (chronic kidney disease) stage 5, GFR less than 15 ml/min (HCC)    Dialysis - T/Th/Sa  . Colitis 09/18/2011  . Dementia ([place]) 09/18/2011  . Diabetes mellitus   . Diverticulosis of colon without hemorrhage 02/27/2014  . Foot ulcer ([place]) 09/18/2011  . High cholesterol   . HTN (hypertension) 09/18/2011  . Hyperlipidemia 11/08/2011  . Hypertension   . Impaired glucose tolerance 09/18/2011  . Pneumonia 09/18/2011  . Pre-ulcerative corn or callous 11/20/2011  . Stroke [facility])    left leg deficit  . Type II or unspecified type diabetes mellitus without mention of complication, uncontrolled 09/20/2011  . Wears glasses    Past Surgical History:  Procedure Laterality Date  . AV FISTULA PLACEMENT Left 12/23/2017   Procedure: CREATION OF BASILIAC - CEPHALIC  ARTERIOVENOUS (AV) FISTULA  STAGE ONE;  Surgeon: Waynetta Sandy, MD;  Location: [place];  Service: Vascular;  Laterality: Left;  . AV FISTULA PLACEMENT Left 07/26/2018   Procedure: INSERTION OF ARTERIOVENOUS (AV) GORE-TEX GRAFT ARM;  Surgeon: Waynetta Sandy, MD;  Location: [place];  Service: Vascular;  Laterality: Left;  . BASCILIC VEIN TRANSPOSITION Left 03/01/2018   Procedure: BASILIC VEIN TRANSPOSITION SECOND STAGE;  Surgeon: Waynetta Sandy, MD;  Location: [place];  Service: Vascular;  Laterality: Left;  . INSERTION OF DIALYSIS CATHETER Right 12/23/2017   Procedure: INSERTION OF PALINDROME  DIALYSIS CATHETER;  Surgeon: Waynetta Sandy, MD;  Location: [place];  Service: Vascular;  Laterality: Right;  . THROMBECTOMY BRACHIAL ARTERY Left 07/26/2018   Procedure: THROMBECTOMY LEFT UPPER EXTREMITY;  Surgeon: Waynetta Sandy, MD;  Location: [place];  Service: Vascular;  Laterality: Left;  . TONSILLECTOMY  1985  . VEIN REPAIR Left 07/26/2018   Procedure: STENT LEFT AXILLARY VEIN;  Surgeon: Waynetta Sandy, MD;  Location: [place];  Service: Vascular;  Laterality: Left;    reports that he has quit smoking. His smoking use included cigarettes. He smoked 0.50 packs per day. He has never used smokeless tobacco. He reports previous alcohol use of about 2.0 standard drinks of alcohol per week. He reports that he does not use drugs. family history includes Diabetes in his maternal grandfather, mother, and sister; Heart disease in his mother; Prostate cancer in his paternal grandfather; Transient ischemic attack in his mother. Allergies  Allergen Reactions  . Lipitor [Atorvastatin] Other (See Comments)   Current Outpatient Medications  on File Prior to Visit  Medication Sig Dispense Refill  . acetaminophen (TYLENOL 8 HOUR) 650 MG CR tablet Take by mouth.    Marland Kitchen aspirin 81 MG EC tablet Take by mouth.    . cyanocobalamin 1000 MCG tablet Take by mouth.    . diltiazem (CARDIZEM LA) 360 MG 24 hr tablet Take by mouth.    .  diphenhydrAMINE (BENADRYL ALLERGY) 25 mg capsule Take by mouth.    . ferric citrate (AURYXIA) 1 GM 210 MG(Fe) tablet Take by mouth.    Marland Kitchen glucose blood (ONE TOUCH ULTRA TEST) test strip Use as instructed once daily E11.9 100 each 12  . hydrALAZINE (APRESOLINE) 100 MG tablet Take by mouth.    Marland Kitchen ketoconazole (NIZORAL) 2 % cream Apply topically.    . Lancets (ONETOUCH ULTRASOFT) lancets 1 each by Other route 2 (two) times daily. Use to check blood sugars once a day Dx e11.9 100 each 3  . metoprolol succinate (TOPROL-XL) 50 MG 24 hr tablet Take 1 tablet (50 mg total) by mouth daily. Annual appt due in February w/labs must see provider for future refills 90 tablet 0  . Multiple Vitamin (MULTIVITAMIN ADULT PO) Take by mouth.    . Multiple Vitamin (MULTIVITAMIN) tablet Take 1 tablet by mouth daily.    Marland Kitchen oxyCODONE-acetaminophen (PERCOCET) 5-325 MG tablet Take 1 tablet by mouth every 6 (six) hours as needed for severe pain. 20 tablet 0  . pioglitazone (ACTOS) 15 MG tablet Take 1 tablet (15 mg total) by mouth daily. 90 tablet 3  . sitaGLIPtin (JANUVIA) 100 MG tablet Take by mouth.    . TRULICITY 1.5 BM/8.4XL SOPN INJECT 0.5MLS INTO THE SKIN ONCE A WEEK 4 mL 3  . UNABLE TO FIND Heparin Sodium (Porcine) 1,000 Units/mL Systemic    . VITAMIN D PO Take by mouth.     No current facility-administered medications on file prior to visit.   Review of Systems All otherwise neg per pt     Objective:   Physical Exam BP 124/82   Pulse 94   Temp 98.1 F (36.7 C)   Ht 6' (1.829 m)   Wt 162 lb (73.5 kg)   SpO2 100%   BMI 21.97 kg/m  VS noted,  Constitutional: Pt appears in NAD HENT: Head: NCAT.  Right Ear: External ear normal.  Left Ear: External ear normal.  Eyes: . Pupils are equal, round, and reactive to light. Conjunctivae and EOM are normal Nose: without d/c or deformity Neck: Neck supple. Gross normal ROM Cardiovascular: Normal rate and regular rhythm.   Pulmonary/Chest: Effort normal and breath  sounds without rales or wheezing.  Abd:  Soft, NT, ND, + BS, no organomegaly Neurological: Pt is alert. At baseline orientation, motor grossly intact Skin: Skin is warm. No rashes, other new lesions, no LE edema Psychiatric: Pt behavior is normal without agitation  All otherwise neg per pt Lab Results  Component Value Date   WBC 9.4 09/06/2018   HGB 13.2 09/06/2018   HCT 39.8 09/06/2018   PLT 207.0 09/06/2018   GLUCOSE 89 09/06/2018   CHOL 194 09/06/2018   TRIG 120.0 09/06/2018   HDL 45.30 09/06/2018   LDLDIRECT 131.0 08/11/2017   LDLCALC 125 (H) 09/06/2018   ALT 9 09/06/2018   AST 14 09/06/2018   NA 137 09/06/2018   K 5.1 09/06/2018   CL 97 09/06/2018   CREATININE 7.88 (HH) 09/06/2018   BUN 39 (H) 09/06/2018   CO2 26 09/06/2018   TSH 1.32  09/06/2018   PSA 0.55 08/11/2017   HGBA1C 5.7 (A) 03/09/2019   MICROALBUR 98.3 (H) 08/11/2017      Assessment & Plan:

## 2019-09-12 NOTE — Progress Notes (Deleted)
Acute Office Visit  Subjective:    Patient ID: Christopher Lopez, male    DOB: 06/10/1961, 59 y.o.   MRN: [id number]  No chief complaint on file.   HPI Patient is in today for ***  Past Medical History:  Diagnosis Date  . Alcohol abuse 09/18/2011  . Allergic rhinitis, cause unspecified 09/20/2011  . Anemia, unspecified 09/18/2011  . Arthritis   . Childhood asthma 09/20/2011  . CKD (chronic kidney disease) stage 5, GFR less than 15 ml/min (HCC)    Dialysis - T/Th/Sa  . Colitis 09/18/2011  . Dementia ([place]) 09/18/2011  . Diabetes mellitus   . Diverticulosis of colon without hemorrhage 02/27/2014  . Foot ulcer ([place]) 09/18/2011  . High cholesterol   . HTN (hypertension) 09/18/2011  . Hyperlipidemia 11/08/2011  . Hypertension   . Impaired glucose tolerance 09/18/2011  . Pneumonia 09/18/2011  . Pre-ulcerative corn or callous 11/20/2011  . Stroke [facility])    left leg deficit  . Type II or unspecified type diabetes mellitus without mention of complication, uncontrolled 09/20/2011  . Wears glasses     Past Surgical History:  Procedure Laterality Date  . AV FISTULA PLACEMENT Left 12/23/2017   Procedure: CREATION OF BASILIAC - CEPHALIC  ARTERIOVENOUS (AV) FISTULA  STAGE ONE;  Surgeon: Waynetta Sandy, MD;  Location: [place];  Service: Vascular;  Laterality: Left;  . AV FISTULA PLACEMENT Left 07/26/2018   Procedure: INSERTION OF ARTERIOVENOUS (AV) GORE-TEX GRAFT ARM;  Surgeon: Waynetta Sandy, MD;  Location: [place];  Service: Vascular;  Laterality: Left;  . BASCILIC VEIN TRANSPOSITION Left 03/01/2018   Procedure: BASILIC VEIN TRANSPOSITION SECOND STAGE;  Surgeon: Waynetta Sandy, MD;  Location: [place];  Service: Vascular;  Laterality: Left;  . INSERTION OF DIALYSIS CATHETER Right 12/23/2017   Procedure: INSERTION OF PALINDROME  DIALYSIS CATHETER;  Surgeon: Waynetta Sandy, MD;  Location: [place];  Service: Vascular;  Laterality: Right;  . THROMBECTOMY BRACHIAL ARTERY Left  07/26/2018   Procedure: THROMBECTOMY LEFT UPPER EXTREMITY;  Surgeon: Waynetta Sandy, MD;  Location: [place];  Service: Vascular;  Laterality: Left;  . TONSILLECTOMY  1985  . VEIN REPAIR Left 07/26/2018   Procedure: STENT LEFT AXILLARY VEIN;  Surgeon: Waynetta Sandy, MD;  Location: [facility];  Service: Vascular;  Laterality: Left;    Family History  Problem Relation Age of Onset  . Prostate cancer Paternal Grandfather   . Diabetes Mother   . Heart disease Mother   . Transient ischemic attack Mother   . Diabetes Sister   . Diabetes Maternal Grandfather   . Colon cancer Neg Hx     Social History   Socioeconomic History  . Marital status: Single    Spouse name: Not on file  . Number of children: Not on file  . Years of education: Not on file  . Highest education level: Not on file  Occupational History  . Not on file  Tobacco Use  . Smoking status: Former Smoker    Packs/day: 0.50    Types: Cigarettes  . Smokeless tobacco: Never Used  Substance and Sexual Activity  . Alcohol use: Not Currently    Alcohol/week: 2.0 standard drinks    Types: 2 Cans of beer per week  . Drug use: No  . Sexual activity: Not on file  Other Topics Concern  . Not on file  Social History Narrative  . Not on file   Social Determinants of Health   Financial Resource Strain:   .  Difficulty of Paying Living Expenses: Not on file  Food Insecurity:   . Worried About Charity fundraiser in the Last Year: Not on file  . Ran Out of Food in the Last Year: Not on file  Transportation Needs:   . Lack of Transportation (Medical): Not on file  . Lack of Transportation (Non-Medical): Not on file  Physical Activity:   . Days of Exercise per Week: Not on file  . Minutes of Exercise per Session: Not on file  Stress:   . Feeling of Stress : Not on file  Social Connections:   . Frequency of Communication with Friends and Family: Not on file  . Frequency of Social Gatherings with Friends and  Family: Not on file  . Attends Religious Services: Not on file  . Active Member of Clubs or Organizations: Not on file  . Attends Archivist Meetings: Not on file  . Marital Status: Not on file  Intimate Partner Violence:   . Fear of Current or Ex-Partner: Not on file  . Emotionally Abused: Not on file  . Physically Abused: Not on file  . Sexually Abused: Not on file    Outpatient Medications Prior to Visit  Medication Sig Dispense Refill  . acetaminophen (TYLENOL 8 HOUR) 650 MG CR tablet Take by mouth.    Marland Kitchen aspirin 81 MG EC tablet Take by mouth.    . cyanocobalamin 1000 MCG tablet Take by mouth.    . diltiazem (CARDIZEM LA) 360 MG 24 hr tablet Take by mouth.    . diphenhydrAMINE (BENADRYL ALLERGY) 25 mg capsule Take by mouth.    . ferric citrate (AURYXIA) 1 GM 210 MG(Fe) tablet Take by mouth.    Marland Kitchen glucose blood (ONE TOUCH ULTRA TEST) test strip Use as instructed once daily E11.9 100 each 12  . hydrALAZINE (APRESOLINE) 100 MG tablet Take by mouth.    Marland Kitchen ketoconazole (NIZORAL) 2 % cream Apply topically.    . Lancets (ONETOUCH ULTRASOFT) lancets 1 each by Other route 2 (two) times daily. Use to check blood sugars once a day Dx e11.9 100 each 3  . lovastatin (MEVACOR) 40 MG tablet Take by mouth.    . metoprolol succinate (TOPROL-XL) 50 MG 24 hr tablet Take 1 tablet (50 mg total) by mouth daily. Annual appt due in February w/labs must see provider for future refills 90 tablet 0  . Multiple Vitamin (MULTIVITAMIN ADULT PO) Take by mouth.    . Multiple Vitamin (MULTIVITAMIN) tablet Take 1 tablet by mouth daily.    Marland Kitchen oxyCODONE-acetaminophen (PERCOCET) 5-325 MG tablet Take 1 tablet by mouth every 6 (six) hours as needed for severe pain. 20 tablet 0  . pioglitazone (ACTOS) 15 MG tablet Take 1 tablet (15 mg total) by mouth daily. 90 tablet 3  . sitaGLIPtin (JANUVIA) 100 MG tablet Take by mouth.    . TRULICITY 1.5 EV/0.3JK SOPN INJECT 0.5MLS INTO THE SKIN ONCE A WEEK 4 mL 3  . UNABLE TO  FIND Heparin Sodium (Porcine) 1,000 Units/mL Systemic    . VITAMIN D PO Take by mouth.    Marland Kitchen azithromycin (ZITHROMAX) 250 MG tablet Take by mouth.     No facility-administered medications prior to visit.    No Known Allergies  Review of Systems     Objective:    Physical Exam  BP 124/82   Pulse 94   Temp 98.1 F (36.7 C)   Ht 6' (1.829 m)   Wt 162 lb (73.5 kg)  SpO2 100%   BMI 21.97 kg/m  Wt Readings from Last 3 Encounters:  09/12/19 162 lb (73.5 kg)  03/09/19 157 lb (71.2 kg)  09/06/18 161 lb (73 kg)    Health Maintenance Due  Topic Date Due  . OPHTHALMOLOGY EXAM  06/27/2019  . HEMOGLOBIN A1C  09/09/2019    There are no preventive care reminders to display for this patient.   Lab Results  Component Value Date   TSH 1.32 09/06/2018   Lab Results  Component Value Date   WBC 9.4 09/06/2018   HGB 13.2 09/06/2018   HCT 39.8 09/06/2018   MCV 98.5 09/06/2018   PLT 207.0 09/06/2018   Lab Results  Component Value Date   NA 137 09/06/2018   K 5.1 09/06/2018   CO2 26 09/06/2018   GLUCOSE 89 09/06/2018   BUN 39 (H) 09/06/2018   CREATININE 7.88 (HH) 09/06/2018   BILITOT 0.4 09/06/2018   ALKPHOS 89 09/06/2018   AST 14 09/06/2018   ALT 9 09/06/2018   PROT 8.8 (H) 09/06/2018   ALBUMIN 4.7 09/06/2018   CALCIUM 9.1 09/06/2018   ANIONGAP 12 12/25/2017   GFR 8.55 (LL) 09/06/2018   Lab Results  Component Value Date   CHOL 194 09/06/2018   Lab Results  Component Value Date   HDL 45.30 09/06/2018   Lab Results  Component Value Date   LDLCALC 125 (H) 09/06/2018   Lab Results  Component Value Date   TRIG 120.0 09/06/2018   Lab Results  Component Value Date   CHOLHDL 4 09/06/2018   Lab Results  Component Value Date   HGBA1C 5.7 (A) 03/09/2019       Assessment & Plan:   Problem List Items Addressed This Visit    None       No orders of the defined types were placed in this encounter.    Cathlean Cower, MD

## 2019-09-12 NOTE — Telephone Encounter (Addendum)
I called pt's mother- I informed her there was a lab error with patient's specimens. I asked her if patient could come back today to have his lab work re-drawn. She states they have transportation issues and will have to come back another day. 09/12/19 labs cancelled. New lab order placed. PCP made aware also.

## 2019-09-13 DIAGNOSIS — Z992 Dependence on renal dialysis: Secondary | ICD-10-CM | POA: Diagnosis not present

## 2019-09-13 DIAGNOSIS — D689 Coagulation defect, unspecified: Secondary | ICD-10-CM | POA: Diagnosis not present

## 2019-09-13 DIAGNOSIS — N2581 Secondary hyperparathyroidism of renal origin: Secondary | ICD-10-CM | POA: Diagnosis not present

## 2019-09-13 DIAGNOSIS — N186 End stage renal disease: Secondary | ICD-10-CM | POA: Diagnosis not present

## 2019-09-13 DIAGNOSIS — E1129 Type 2 diabetes mellitus with other diabetic kidney complication: Secondary | ICD-10-CM | POA: Diagnosis not present

## 2019-09-13 DIAGNOSIS — D509 Iron deficiency anemia, unspecified: Secondary | ICD-10-CM | POA: Diagnosis not present

## 2019-09-14 NOTE — Telephone Encounter (Signed)
Error. See other 09/12/19 tele encounter.

## 2019-09-15 ENCOUNTER — Encounter: Payer: Self-pay | Admitting: Internal Medicine

## 2019-09-15 DIAGNOSIS — D509 Iron deficiency anemia, unspecified: Secondary | ICD-10-CM | POA: Diagnosis not present

## 2019-09-15 DIAGNOSIS — Z992 Dependence on renal dialysis: Secondary | ICD-10-CM | POA: Diagnosis not present

## 2019-09-15 DIAGNOSIS — E1129 Type 2 diabetes mellitus with other diabetic kidney complication: Secondary | ICD-10-CM | POA: Diagnosis not present

## 2019-09-15 DIAGNOSIS — D689 Coagulation defect, unspecified: Secondary | ICD-10-CM | POA: Diagnosis not present

## 2019-09-15 DIAGNOSIS — N186 End stage renal disease: Secondary | ICD-10-CM | POA: Diagnosis not present

## 2019-09-15 DIAGNOSIS — N2581 Secondary hyperparathyroidism of renal origin: Secondary | ICD-10-CM | POA: Diagnosis not present

## 2019-09-15 NOTE — Assessment & Plan Note (Signed)
stable overall by history and exam, recent data reviewed with pt, and pt to continue medical treatment as before,  to f/u any worsening symptoms or concerns  

## 2019-09-15 NOTE — Assessment & Plan Note (Signed)
Change statin to crestor 20 qd

## 2019-09-15 NOTE — Assessment & Plan Note (Signed)

## 2019-09-18 DIAGNOSIS — N2581 Secondary hyperparathyroidism of renal origin: Secondary | ICD-10-CM | POA: Diagnosis not present

## 2019-09-18 DIAGNOSIS — N186 End stage renal disease: Secondary | ICD-10-CM | POA: Diagnosis not present

## 2019-09-18 DIAGNOSIS — E1129 Type 2 diabetes mellitus with other diabetic kidney complication: Secondary | ICD-10-CM | POA: Diagnosis not present

## 2019-09-18 DIAGNOSIS — Z992 Dependence on renal dialysis: Secondary | ICD-10-CM | POA: Diagnosis not present

## 2019-09-18 DIAGNOSIS — D689 Coagulation defect, unspecified: Secondary | ICD-10-CM | POA: Diagnosis not present

## 2019-09-18 DIAGNOSIS — D509 Iron deficiency anemia, unspecified: Secondary | ICD-10-CM | POA: Diagnosis not present

## 2019-09-19 NOTE — Telephone Encounter (Addendum)
FYI I called pt and spoke to him and his mother today. I offered to reschedule him to come back for his labs due to the lab error on  09/12/19.  I informed his mother last week that there was a lab error and we need to re-draw his labs. She stated they had to pay for a taxi to get here.   I informed patient and mother per Danae Chen, Glass blower/designer we can pay for his transportation. Patient states he has to go to dialysis 3 times weekly and he does not want to come back to this office to have his labs drawn.  I offered a lab appointment, apologized for the inconvenience of having to come back for re-draw and informed him we would cover the cost of his taxi transportation. Patient still declined.

## 2019-09-20 DIAGNOSIS — E1129 Type 2 diabetes mellitus with other diabetic kidney complication: Secondary | ICD-10-CM | POA: Diagnosis not present

## 2019-09-20 DIAGNOSIS — Z992 Dependence on renal dialysis: Secondary | ICD-10-CM | POA: Diagnosis not present

## 2019-09-20 DIAGNOSIS — N186 End stage renal disease: Secondary | ICD-10-CM | POA: Diagnosis not present

## 2019-09-20 DIAGNOSIS — N2581 Secondary hyperparathyroidism of renal origin: Secondary | ICD-10-CM | POA: Diagnosis not present

## 2019-09-20 DIAGNOSIS — D689 Coagulation defect, unspecified: Secondary | ICD-10-CM | POA: Diagnosis not present

## 2019-09-20 DIAGNOSIS — D509 Iron deficiency anemia, unspecified: Secondary | ICD-10-CM | POA: Diagnosis not present

## 2019-09-22 DIAGNOSIS — N2581 Secondary hyperparathyroidism of renal origin: Secondary | ICD-10-CM | POA: Diagnosis not present

## 2019-09-22 DIAGNOSIS — N186 End stage renal disease: Secondary | ICD-10-CM | POA: Diagnosis not present

## 2019-09-22 DIAGNOSIS — D689 Coagulation defect, unspecified: Secondary | ICD-10-CM | POA: Diagnosis not present

## 2019-09-22 DIAGNOSIS — D509 Iron deficiency anemia, unspecified: Secondary | ICD-10-CM | POA: Diagnosis not present

## 2019-09-22 DIAGNOSIS — E1129 Type 2 diabetes mellitus with other diabetic kidney complication: Secondary | ICD-10-CM | POA: Diagnosis not present

## 2019-09-22 DIAGNOSIS — Z992 Dependence on renal dialysis: Secondary | ICD-10-CM | POA: Diagnosis not present

## 2019-09-25 DIAGNOSIS — N186 End stage renal disease: Secondary | ICD-10-CM | POA: Diagnosis not present

## 2019-09-25 DIAGNOSIS — D509 Iron deficiency anemia, unspecified: Secondary | ICD-10-CM | POA: Diagnosis not present

## 2019-09-25 DIAGNOSIS — N2581 Secondary hyperparathyroidism of renal origin: Secondary | ICD-10-CM | POA: Diagnosis not present

## 2019-09-25 DIAGNOSIS — E1129 Type 2 diabetes mellitus with other diabetic kidney complication: Secondary | ICD-10-CM | POA: Diagnosis not present

## 2019-09-25 DIAGNOSIS — Z992 Dependence on renal dialysis: Secondary | ICD-10-CM | POA: Diagnosis not present

## 2019-09-25 DIAGNOSIS — D689 Coagulation defect, unspecified: Secondary | ICD-10-CM | POA: Diagnosis not present

## 2019-09-27 DIAGNOSIS — D689 Coagulation defect, unspecified: Secondary | ICD-10-CM | POA: Diagnosis not present

## 2019-09-27 DIAGNOSIS — N186 End stage renal disease: Secondary | ICD-10-CM | POA: Diagnosis not present

## 2019-09-27 DIAGNOSIS — N2581 Secondary hyperparathyroidism of renal origin: Secondary | ICD-10-CM | POA: Diagnosis not present

## 2019-09-27 DIAGNOSIS — Z992 Dependence on renal dialysis: Secondary | ICD-10-CM | POA: Diagnosis not present

## 2019-09-27 DIAGNOSIS — E1129 Type 2 diabetes mellitus with other diabetic kidney complication: Secondary | ICD-10-CM | POA: Diagnosis not present

## 2019-09-27 DIAGNOSIS — D509 Iron deficiency anemia, unspecified: Secondary | ICD-10-CM | POA: Diagnosis not present

## 2019-09-29 DIAGNOSIS — D509 Iron deficiency anemia, unspecified: Secondary | ICD-10-CM | POA: Diagnosis not present

## 2019-09-29 DIAGNOSIS — N186 End stage renal disease: Secondary | ICD-10-CM | POA: Diagnosis not present

## 2019-09-29 DIAGNOSIS — E1129 Type 2 diabetes mellitus with other diabetic kidney complication: Secondary | ICD-10-CM | POA: Diagnosis not present

## 2019-09-29 DIAGNOSIS — D689 Coagulation defect, unspecified: Secondary | ICD-10-CM | POA: Diagnosis not present

## 2019-09-29 DIAGNOSIS — N2581 Secondary hyperparathyroidism of renal origin: Secondary | ICD-10-CM | POA: Diagnosis not present

## 2019-09-29 DIAGNOSIS — Z992 Dependence on renal dialysis: Secondary | ICD-10-CM | POA: Diagnosis not present

## 2019-10-02 DIAGNOSIS — D509 Iron deficiency anemia, unspecified: Secondary | ICD-10-CM | POA: Diagnosis not present

## 2019-10-02 DIAGNOSIS — D689 Coagulation defect, unspecified: Secondary | ICD-10-CM | POA: Diagnosis not present

## 2019-10-02 DIAGNOSIS — Z992 Dependence on renal dialysis: Secondary | ICD-10-CM | POA: Diagnosis not present

## 2019-10-02 DIAGNOSIS — N2581 Secondary hyperparathyroidism of renal origin: Secondary | ICD-10-CM | POA: Diagnosis not present

## 2019-10-02 DIAGNOSIS — N186 End stage renal disease: Secondary | ICD-10-CM | POA: Diagnosis not present

## 2019-10-02 DIAGNOSIS — E1129 Type 2 diabetes mellitus with other diabetic kidney complication: Secondary | ICD-10-CM | POA: Diagnosis not present

## 2019-10-04 DIAGNOSIS — D509 Iron deficiency anemia, unspecified: Secondary | ICD-10-CM | POA: Diagnosis not present

## 2019-10-04 DIAGNOSIS — N2581 Secondary hyperparathyroidism of renal origin: Secondary | ICD-10-CM | POA: Diagnosis not present

## 2019-10-04 DIAGNOSIS — E1129 Type 2 diabetes mellitus with other diabetic kidney complication: Secondary | ICD-10-CM | POA: Diagnosis not present

## 2019-10-04 DIAGNOSIS — D689 Coagulation defect, unspecified: Secondary | ICD-10-CM | POA: Diagnosis not present

## 2019-10-04 DIAGNOSIS — Z992 Dependence on renal dialysis: Secondary | ICD-10-CM | POA: Diagnosis not present

## 2019-10-04 DIAGNOSIS — N186 End stage renal disease: Secondary | ICD-10-CM | POA: Diagnosis not present

## 2019-10-06 DIAGNOSIS — Z992 Dependence on renal dialysis: Secondary | ICD-10-CM | POA: Diagnosis not present

## 2019-10-06 DIAGNOSIS — D509 Iron deficiency anemia, unspecified: Secondary | ICD-10-CM | POA: Diagnosis not present

## 2019-10-06 DIAGNOSIS — D689 Coagulation defect, unspecified: Secondary | ICD-10-CM | POA: Diagnosis not present

## 2019-10-06 DIAGNOSIS — E1129 Type 2 diabetes mellitus with other diabetic kidney complication: Secondary | ICD-10-CM | POA: Diagnosis not present

## 2019-10-06 DIAGNOSIS — N2581 Secondary hyperparathyroidism of renal origin: Secondary | ICD-10-CM | POA: Diagnosis not present

## 2019-10-06 DIAGNOSIS — N186 End stage renal disease: Secondary | ICD-10-CM | POA: Diagnosis not present

## 2019-10-09 DIAGNOSIS — D509 Iron deficiency anemia, unspecified: Secondary | ICD-10-CM | POA: Diagnosis not present

## 2019-10-09 DIAGNOSIS — N186 End stage renal disease: Secondary | ICD-10-CM | POA: Diagnosis not present

## 2019-10-09 DIAGNOSIS — N2581 Secondary hyperparathyroidism of renal origin: Secondary | ICD-10-CM | POA: Diagnosis not present

## 2019-10-09 DIAGNOSIS — E1129 Type 2 diabetes mellitus with other diabetic kidney complication: Secondary | ICD-10-CM | POA: Diagnosis not present

## 2019-10-09 DIAGNOSIS — D689 Coagulation defect, unspecified: Secondary | ICD-10-CM | POA: Diagnosis not present

## 2019-10-09 DIAGNOSIS — Z992 Dependence on renal dialysis: Secondary | ICD-10-CM | POA: Diagnosis not present

## 2019-10-10 DIAGNOSIS — E1122 Type 2 diabetes mellitus with diabetic chronic kidney disease: Secondary | ICD-10-CM | POA: Diagnosis not present

## 2019-10-10 DIAGNOSIS — N186 End stage renal disease: Secondary | ICD-10-CM | POA: Diagnosis not present

## 2019-10-10 DIAGNOSIS — Z992 Dependence on renal dialysis: Secondary | ICD-10-CM | POA: Diagnosis not present

## 2019-10-11 DIAGNOSIS — N2581 Secondary hyperparathyroidism of renal origin: Secondary | ICD-10-CM | POA: Diagnosis not present

## 2019-10-11 DIAGNOSIS — D509 Iron deficiency anemia, unspecified: Secondary | ICD-10-CM | POA: Diagnosis not present

## 2019-10-11 DIAGNOSIS — D689 Coagulation defect, unspecified: Secondary | ICD-10-CM | POA: Diagnosis not present

## 2019-10-11 DIAGNOSIS — N186 End stage renal disease: Secondary | ICD-10-CM | POA: Diagnosis not present

## 2019-10-11 DIAGNOSIS — D631 Anemia in chronic kidney disease: Secondary | ICD-10-CM | POA: Diagnosis not present

## 2019-10-11 DIAGNOSIS — Z992 Dependence on renal dialysis: Secondary | ICD-10-CM | POA: Diagnosis not present

## 2019-10-11 DIAGNOSIS — E1129 Type 2 diabetes mellitus with other diabetic kidney complication: Secondary | ICD-10-CM | POA: Diagnosis not present

## 2019-10-13 DIAGNOSIS — E1129 Type 2 diabetes mellitus with other diabetic kidney complication: Secondary | ICD-10-CM | POA: Diagnosis not present

## 2019-10-13 DIAGNOSIS — D631 Anemia in chronic kidney disease: Secondary | ICD-10-CM | POA: Diagnosis not present

## 2019-10-13 DIAGNOSIS — N186 End stage renal disease: Secondary | ICD-10-CM | POA: Diagnosis not present

## 2019-10-13 DIAGNOSIS — Z992 Dependence on renal dialysis: Secondary | ICD-10-CM | POA: Diagnosis not present

## 2019-10-13 DIAGNOSIS — N2581 Secondary hyperparathyroidism of renal origin: Secondary | ICD-10-CM | POA: Diagnosis not present

## 2019-10-13 DIAGNOSIS — D509 Iron deficiency anemia, unspecified: Secondary | ICD-10-CM | POA: Diagnosis not present

## 2019-10-13 DIAGNOSIS — D689 Coagulation defect, unspecified: Secondary | ICD-10-CM | POA: Diagnosis not present

## 2019-10-16 DIAGNOSIS — N2581 Secondary hyperparathyroidism of renal origin: Secondary | ICD-10-CM | POA: Diagnosis not present

## 2019-10-16 DIAGNOSIS — D689 Coagulation defect, unspecified: Secondary | ICD-10-CM | POA: Diagnosis not present

## 2019-10-16 DIAGNOSIS — D631 Anemia in chronic kidney disease: Secondary | ICD-10-CM | POA: Diagnosis not present

## 2019-10-16 DIAGNOSIS — E1129 Type 2 diabetes mellitus with other diabetic kidney complication: Secondary | ICD-10-CM | POA: Diagnosis not present

## 2019-10-16 DIAGNOSIS — N186 End stage renal disease: Secondary | ICD-10-CM | POA: Diagnosis not present

## 2019-10-16 DIAGNOSIS — Z992 Dependence on renal dialysis: Secondary | ICD-10-CM | POA: Diagnosis not present

## 2019-10-16 DIAGNOSIS — D509 Iron deficiency anemia, unspecified: Secondary | ICD-10-CM | POA: Diagnosis not present

## 2019-10-18 DIAGNOSIS — N2581 Secondary hyperparathyroidism of renal origin: Secondary | ICD-10-CM | POA: Diagnosis not present

## 2019-10-18 DIAGNOSIS — Z992 Dependence on renal dialysis: Secondary | ICD-10-CM | POA: Diagnosis not present

## 2019-10-18 DIAGNOSIS — D509 Iron deficiency anemia, unspecified: Secondary | ICD-10-CM | POA: Diagnosis not present

## 2019-10-18 DIAGNOSIS — D631 Anemia in chronic kidney disease: Secondary | ICD-10-CM | POA: Diagnosis not present

## 2019-10-18 DIAGNOSIS — N186 End stage renal disease: Secondary | ICD-10-CM | POA: Diagnosis not present

## 2019-10-18 DIAGNOSIS — E1129 Type 2 diabetes mellitus with other diabetic kidney complication: Secondary | ICD-10-CM | POA: Diagnosis not present

## 2019-10-18 DIAGNOSIS — D689 Coagulation defect, unspecified: Secondary | ICD-10-CM | POA: Diagnosis not present

## 2019-10-20 DIAGNOSIS — Z992 Dependence on renal dialysis: Secondary | ICD-10-CM | POA: Diagnosis not present

## 2019-10-20 DIAGNOSIS — D689 Coagulation defect, unspecified: Secondary | ICD-10-CM | POA: Diagnosis not present

## 2019-10-20 DIAGNOSIS — N186 End stage renal disease: Secondary | ICD-10-CM | POA: Diagnosis not present

## 2019-10-20 DIAGNOSIS — D631 Anemia in chronic kidney disease: Secondary | ICD-10-CM | POA: Diagnosis not present

## 2019-10-20 DIAGNOSIS — E1129 Type 2 diabetes mellitus with other diabetic kidney complication: Secondary | ICD-10-CM | POA: Diagnosis not present

## 2019-10-20 DIAGNOSIS — N2581 Secondary hyperparathyroidism of renal origin: Secondary | ICD-10-CM | POA: Diagnosis not present

## 2019-10-20 DIAGNOSIS — D509 Iron deficiency anemia, unspecified: Secondary | ICD-10-CM | POA: Diagnosis not present

## 2019-10-23 DIAGNOSIS — D631 Anemia in chronic kidney disease: Secondary | ICD-10-CM | POA: Diagnosis not present

## 2019-10-23 DIAGNOSIS — D689 Coagulation defect, unspecified: Secondary | ICD-10-CM | POA: Diagnosis not present

## 2019-10-23 DIAGNOSIS — Z992 Dependence on renal dialysis: Secondary | ICD-10-CM | POA: Diagnosis not present

## 2019-10-23 DIAGNOSIS — N2581 Secondary hyperparathyroidism of renal origin: Secondary | ICD-10-CM | POA: Diagnosis not present

## 2019-10-23 DIAGNOSIS — N186 End stage renal disease: Secondary | ICD-10-CM | POA: Diagnosis not present

## 2019-10-23 DIAGNOSIS — D509 Iron deficiency anemia, unspecified: Secondary | ICD-10-CM | POA: Diagnosis not present

## 2019-10-23 DIAGNOSIS — E1129 Type 2 diabetes mellitus with other diabetic kidney complication: Secondary | ICD-10-CM | POA: Diagnosis not present

## 2019-10-25 DIAGNOSIS — D631 Anemia in chronic kidney disease: Secondary | ICD-10-CM | POA: Diagnosis not present

## 2019-10-25 DIAGNOSIS — Z992 Dependence on renal dialysis: Secondary | ICD-10-CM | POA: Diagnosis not present

## 2019-10-25 DIAGNOSIS — D689 Coagulation defect, unspecified: Secondary | ICD-10-CM | POA: Diagnosis not present

## 2019-10-25 DIAGNOSIS — N2581 Secondary hyperparathyroidism of renal origin: Secondary | ICD-10-CM | POA: Diagnosis not present

## 2019-10-25 DIAGNOSIS — E1129 Type 2 diabetes mellitus with other diabetic kidney complication: Secondary | ICD-10-CM | POA: Diagnosis not present

## 2019-10-25 DIAGNOSIS — N186 End stage renal disease: Secondary | ICD-10-CM | POA: Diagnosis not present

## 2019-10-25 DIAGNOSIS — D509 Iron deficiency anemia, unspecified: Secondary | ICD-10-CM | POA: Diagnosis not present

## 2019-10-27 DIAGNOSIS — D631 Anemia in chronic kidney disease: Secondary | ICD-10-CM | POA: Diagnosis not present

## 2019-10-27 DIAGNOSIS — D509 Iron deficiency anemia, unspecified: Secondary | ICD-10-CM | POA: Diagnosis not present

## 2019-10-27 DIAGNOSIS — E1129 Type 2 diabetes mellitus with other diabetic kidney complication: Secondary | ICD-10-CM | POA: Diagnosis not present

## 2019-10-27 DIAGNOSIS — N186 End stage renal disease: Secondary | ICD-10-CM | POA: Diagnosis not present

## 2019-10-27 DIAGNOSIS — Z992 Dependence on renal dialysis: Secondary | ICD-10-CM | POA: Diagnosis not present

## 2019-10-27 DIAGNOSIS — D689 Coagulation defect, unspecified: Secondary | ICD-10-CM | POA: Diagnosis not present

## 2019-10-27 DIAGNOSIS — N2581 Secondary hyperparathyroidism of renal origin: Secondary | ICD-10-CM | POA: Diagnosis not present

## 2019-10-30 DIAGNOSIS — D689 Coagulation defect, unspecified: Secondary | ICD-10-CM | POA: Diagnosis not present

## 2019-10-30 DIAGNOSIS — N186 End stage renal disease: Secondary | ICD-10-CM | POA: Diagnosis not present

## 2019-10-30 DIAGNOSIS — N2581 Secondary hyperparathyroidism of renal origin: Secondary | ICD-10-CM | POA: Diagnosis not present

## 2019-10-30 DIAGNOSIS — D509 Iron deficiency anemia, unspecified: Secondary | ICD-10-CM | POA: Diagnosis not present

## 2019-10-30 DIAGNOSIS — D631 Anemia in chronic kidney disease: Secondary | ICD-10-CM | POA: Diagnosis not present

## 2019-10-30 DIAGNOSIS — Z992 Dependence on renal dialysis: Secondary | ICD-10-CM | POA: Diagnosis not present

## 2019-10-30 DIAGNOSIS — E1129 Type 2 diabetes mellitus with other diabetic kidney complication: Secondary | ICD-10-CM | POA: Diagnosis not present

## 2019-10-31 ENCOUNTER — Ambulatory Visit: Payer: Medicare Other | Admitting: Podiatry

## 2019-11-01 DIAGNOSIS — D631 Anemia in chronic kidney disease: Secondary | ICD-10-CM | POA: Diagnosis not present

## 2019-11-01 DIAGNOSIS — E1129 Type 2 diabetes mellitus with other diabetic kidney complication: Secondary | ICD-10-CM | POA: Diagnosis not present

## 2019-11-01 DIAGNOSIS — D689 Coagulation defect, unspecified: Secondary | ICD-10-CM | POA: Diagnosis not present

## 2019-11-01 DIAGNOSIS — D509 Iron deficiency anemia, unspecified: Secondary | ICD-10-CM | POA: Diagnosis not present

## 2019-11-01 DIAGNOSIS — Z992 Dependence on renal dialysis: Secondary | ICD-10-CM | POA: Diagnosis not present

## 2019-11-01 DIAGNOSIS — N186 End stage renal disease: Secondary | ICD-10-CM | POA: Diagnosis not present

## 2019-11-01 DIAGNOSIS — N2581 Secondary hyperparathyroidism of renal origin: Secondary | ICD-10-CM | POA: Diagnosis not present

## 2019-11-03 DIAGNOSIS — D689 Coagulation defect, unspecified: Secondary | ICD-10-CM | POA: Diagnosis not present

## 2019-11-03 DIAGNOSIS — Z992 Dependence on renal dialysis: Secondary | ICD-10-CM | POA: Diagnosis not present

## 2019-11-03 DIAGNOSIS — N186 End stage renal disease: Secondary | ICD-10-CM | POA: Diagnosis not present

## 2019-11-03 DIAGNOSIS — D509 Iron deficiency anemia, unspecified: Secondary | ICD-10-CM | POA: Diagnosis not present

## 2019-11-03 DIAGNOSIS — D631 Anemia in chronic kidney disease: Secondary | ICD-10-CM | POA: Diagnosis not present

## 2019-11-03 DIAGNOSIS — E1129 Type 2 diabetes mellitus with other diabetic kidney complication: Secondary | ICD-10-CM | POA: Diagnosis not present

## 2019-11-03 DIAGNOSIS — N2581 Secondary hyperparathyroidism of renal origin: Secondary | ICD-10-CM | POA: Diagnosis not present

## 2019-11-06 DIAGNOSIS — N2581 Secondary hyperparathyroidism of renal origin: Secondary | ICD-10-CM | POA: Diagnosis not present

## 2019-11-06 DIAGNOSIS — D509 Iron deficiency anemia, unspecified: Secondary | ICD-10-CM | POA: Diagnosis not present

## 2019-11-06 DIAGNOSIS — N186 End stage renal disease: Secondary | ICD-10-CM | POA: Diagnosis not present

## 2019-11-06 DIAGNOSIS — D689 Coagulation defect, unspecified: Secondary | ICD-10-CM | POA: Diagnosis not present

## 2019-11-06 DIAGNOSIS — D631 Anemia in chronic kidney disease: Secondary | ICD-10-CM | POA: Diagnosis not present

## 2019-11-06 DIAGNOSIS — E1129 Type 2 diabetes mellitus with other diabetic kidney complication: Secondary | ICD-10-CM | POA: Diagnosis not present

## 2019-11-06 DIAGNOSIS — Z992 Dependence on renal dialysis: Secondary | ICD-10-CM | POA: Diagnosis not present

## 2019-11-07 ENCOUNTER — Encounter: Payer: Self-pay | Admitting: Podiatry

## 2019-11-07 ENCOUNTER — Other Ambulatory Visit: Payer: Self-pay

## 2019-11-07 ENCOUNTER — Ambulatory Visit: Payer: Medicare Other | Admitting: Podiatry

## 2019-11-07 DIAGNOSIS — E1142 Type 2 diabetes mellitus with diabetic polyneuropathy: Secondary | ICD-10-CM | POA: Diagnosis not present

## 2019-11-07 DIAGNOSIS — Q828 Other specified congenital malformations of skin: Secondary | ICD-10-CM | POA: Diagnosis not present

## 2019-11-07 DIAGNOSIS — M79676 Pain in unspecified toe(s): Secondary | ICD-10-CM

## 2019-11-07 DIAGNOSIS — B351 Tinea unguium: Secondary | ICD-10-CM

## 2019-11-07 DIAGNOSIS — L280 Lichen simplex chronicus: Secondary | ICD-10-CM | POA: Diagnosis not present

## 2019-11-07 NOTE — Progress Notes (Signed)
This patient returns to my office for at risk foot care.  This patient requires this care by a professional since this patient will be at risk due to having  ESRD, CKD and diabetes.  This patient is unable to cut nails himself since the patient cannot reach his nails.These nails are painful walking and wearing shoes.  This patient presents for at risk foot care today.  General Appearance  Alert, conversant and in no acute stress.  Vascular  Dorsalis pedis and posterior tibial  pulses are palpable  bilaterally.  Capillary return is within normal limits  bilaterally. Temperature is within normal limits  bilaterally.  Neurologic  Senn-Weinstein monofilament wire test diminished   bilaterally. Muscle power within normal limits bilaterally.  Nails Thick disfigured discolored nails with subungual debris  from hallux to fifth toes bilaterally. No evidence of bacterial infection or drainage bilaterally.  Orthopedic  No limitations of motion  feet .  No crepitus or effusions noted.  No bony pathology or digital deformities noted.  Skin  normotropic skin  noted bilaterally.  No signs of infections or ulcers noted.   Porokeratosis sub 4  B/L asymptomatic.  Onychomycosis  Pain in right toes  Pain in left toes  Consent was obtained for treatment procedures.   Mechanical debridement of nails 1-5  bilaterally performed with a nail nipper.  Filed with dremel without incident.  Debride porokeratosis with # 15 blade.   Return office visit   10 weeks                  Told patient to return for periodic foot care and evaluation due to potential at risk complications.   Gardiner Barefoot DPM

## 2019-11-08 DIAGNOSIS — N186 End stage renal disease: Secondary | ICD-10-CM | POA: Diagnosis not present

## 2019-11-08 DIAGNOSIS — N2581 Secondary hyperparathyroidism of renal origin: Secondary | ICD-10-CM | POA: Diagnosis not present

## 2019-11-08 DIAGNOSIS — D689 Coagulation defect, unspecified: Secondary | ICD-10-CM | POA: Diagnosis not present

## 2019-11-08 DIAGNOSIS — E1129 Type 2 diabetes mellitus with other diabetic kidney complication: Secondary | ICD-10-CM | POA: Diagnosis not present

## 2019-11-08 DIAGNOSIS — D509 Iron deficiency anemia, unspecified: Secondary | ICD-10-CM | POA: Diagnosis not present

## 2019-11-08 DIAGNOSIS — Z992 Dependence on renal dialysis: Secondary | ICD-10-CM | POA: Diagnosis not present

## 2019-11-08 DIAGNOSIS — D631 Anemia in chronic kidney disease: Secondary | ICD-10-CM | POA: Diagnosis not present

## 2019-11-09 DIAGNOSIS — E1122 Type 2 diabetes mellitus with diabetic chronic kidney disease: Secondary | ICD-10-CM | POA: Diagnosis not present

## 2019-11-09 DIAGNOSIS — Z992 Dependence on renal dialysis: Secondary | ICD-10-CM | POA: Diagnosis not present

## 2019-11-09 DIAGNOSIS — N186 End stage renal disease: Secondary | ICD-10-CM | POA: Diagnosis not present

## 2019-11-10 DIAGNOSIS — N186 End stage renal disease: Secondary | ICD-10-CM | POA: Diagnosis not present

## 2019-11-10 DIAGNOSIS — Z992 Dependence on renal dialysis: Secondary | ICD-10-CM | POA: Diagnosis not present

## 2019-11-10 DIAGNOSIS — D509 Iron deficiency anemia, unspecified: Secondary | ICD-10-CM | POA: Diagnosis not present

## 2019-11-10 DIAGNOSIS — N2581 Secondary hyperparathyroidism of renal origin: Secondary | ICD-10-CM | POA: Diagnosis not present

## 2019-11-10 DIAGNOSIS — D689 Coagulation defect, unspecified: Secondary | ICD-10-CM | POA: Diagnosis not present

## 2019-11-10 DIAGNOSIS — D631 Anemia in chronic kidney disease: Secondary | ICD-10-CM | POA: Diagnosis not present

## 2019-11-13 DIAGNOSIS — N186 End stage renal disease: Secondary | ICD-10-CM | POA: Diagnosis not present

## 2019-11-13 DIAGNOSIS — D509 Iron deficiency anemia, unspecified: Secondary | ICD-10-CM | POA: Diagnosis not present

## 2019-11-13 DIAGNOSIS — Z992 Dependence on renal dialysis: Secondary | ICD-10-CM | POA: Diagnosis not present

## 2019-11-13 DIAGNOSIS — D631 Anemia in chronic kidney disease: Secondary | ICD-10-CM | POA: Diagnosis not present

## 2019-11-13 DIAGNOSIS — N2581 Secondary hyperparathyroidism of renal origin: Secondary | ICD-10-CM | POA: Diagnosis not present

## 2019-11-13 DIAGNOSIS — D689 Coagulation defect, unspecified: Secondary | ICD-10-CM | POA: Diagnosis not present

## 2019-11-15 DIAGNOSIS — N2581 Secondary hyperparathyroidism of renal origin: Secondary | ICD-10-CM | POA: Diagnosis not present

## 2019-11-15 DIAGNOSIS — Z992 Dependence on renal dialysis: Secondary | ICD-10-CM | POA: Diagnosis not present

## 2019-11-15 DIAGNOSIS — D689 Coagulation defect, unspecified: Secondary | ICD-10-CM | POA: Diagnosis not present

## 2019-11-15 DIAGNOSIS — N186 End stage renal disease: Secondary | ICD-10-CM | POA: Diagnosis not present

## 2019-11-15 DIAGNOSIS — D509 Iron deficiency anemia, unspecified: Secondary | ICD-10-CM | POA: Diagnosis not present

## 2019-11-15 DIAGNOSIS — D631 Anemia in chronic kidney disease: Secondary | ICD-10-CM | POA: Diagnosis not present

## 2019-11-17 DIAGNOSIS — D631 Anemia in chronic kidney disease: Secondary | ICD-10-CM | POA: Diagnosis not present

## 2019-11-17 DIAGNOSIS — D509 Iron deficiency anemia, unspecified: Secondary | ICD-10-CM | POA: Diagnosis not present

## 2019-11-17 DIAGNOSIS — D689 Coagulation defect, unspecified: Secondary | ICD-10-CM | POA: Diagnosis not present

## 2019-11-17 DIAGNOSIS — N2581 Secondary hyperparathyroidism of renal origin: Secondary | ICD-10-CM | POA: Diagnosis not present

## 2019-11-17 DIAGNOSIS — N186 End stage renal disease: Secondary | ICD-10-CM | POA: Diagnosis not present

## 2019-11-17 DIAGNOSIS — Z992 Dependence on renal dialysis: Secondary | ICD-10-CM | POA: Diagnosis not present

## 2019-11-20 DIAGNOSIS — Z992 Dependence on renal dialysis: Secondary | ICD-10-CM | POA: Diagnosis not present

## 2019-11-20 DIAGNOSIS — D509 Iron deficiency anemia, unspecified: Secondary | ICD-10-CM | POA: Diagnosis not present

## 2019-11-20 DIAGNOSIS — N2581 Secondary hyperparathyroidism of renal origin: Secondary | ICD-10-CM | POA: Diagnosis not present

## 2019-11-20 DIAGNOSIS — N186 End stage renal disease: Secondary | ICD-10-CM | POA: Diagnosis not present

## 2019-11-20 DIAGNOSIS — D689 Coagulation defect, unspecified: Secondary | ICD-10-CM | POA: Diagnosis not present

## 2019-11-20 DIAGNOSIS — D631 Anemia in chronic kidney disease: Secondary | ICD-10-CM | POA: Diagnosis not present

## 2019-11-22 DIAGNOSIS — Z992 Dependence on renal dialysis: Secondary | ICD-10-CM | POA: Diagnosis not present

## 2019-11-22 DIAGNOSIS — N186 End stage renal disease: Secondary | ICD-10-CM | POA: Diagnosis not present

## 2019-11-22 DIAGNOSIS — D689 Coagulation defect, unspecified: Secondary | ICD-10-CM | POA: Diagnosis not present

## 2019-11-22 DIAGNOSIS — D631 Anemia in chronic kidney disease: Secondary | ICD-10-CM | POA: Diagnosis not present

## 2019-11-22 DIAGNOSIS — N2581 Secondary hyperparathyroidism of renal origin: Secondary | ICD-10-CM | POA: Diagnosis not present

## 2019-11-22 DIAGNOSIS — D509 Iron deficiency anemia, unspecified: Secondary | ICD-10-CM | POA: Diagnosis not present

## 2019-11-24 DIAGNOSIS — D689 Coagulation defect, unspecified: Secondary | ICD-10-CM | POA: Diagnosis not present

## 2019-11-24 DIAGNOSIS — Z992 Dependence on renal dialysis: Secondary | ICD-10-CM | POA: Diagnosis not present

## 2019-11-24 DIAGNOSIS — N2581 Secondary hyperparathyroidism of renal origin: Secondary | ICD-10-CM | POA: Diagnosis not present

## 2019-11-24 DIAGNOSIS — D631 Anemia in chronic kidney disease: Secondary | ICD-10-CM | POA: Diagnosis not present

## 2019-11-24 DIAGNOSIS — N186 End stage renal disease: Secondary | ICD-10-CM | POA: Diagnosis not present

## 2019-11-24 DIAGNOSIS — D509 Iron deficiency anemia, unspecified: Secondary | ICD-10-CM | POA: Diagnosis not present

## 2019-11-27 DIAGNOSIS — N186 End stage renal disease: Secondary | ICD-10-CM | POA: Diagnosis not present

## 2019-11-27 DIAGNOSIS — D689 Coagulation defect, unspecified: Secondary | ICD-10-CM | POA: Diagnosis not present

## 2019-11-27 DIAGNOSIS — Z992 Dependence on renal dialysis: Secondary | ICD-10-CM | POA: Diagnosis not present

## 2019-11-27 DIAGNOSIS — N2581 Secondary hyperparathyroidism of renal origin: Secondary | ICD-10-CM | POA: Diagnosis not present

## 2019-11-27 DIAGNOSIS — D509 Iron deficiency anemia, unspecified: Secondary | ICD-10-CM | POA: Diagnosis not present

## 2019-11-27 DIAGNOSIS — D631 Anemia in chronic kidney disease: Secondary | ICD-10-CM | POA: Diagnosis not present

## 2019-11-29 DIAGNOSIS — D689 Coagulation defect, unspecified: Secondary | ICD-10-CM | POA: Diagnosis not present

## 2019-11-29 DIAGNOSIS — N2581 Secondary hyperparathyroidism of renal origin: Secondary | ICD-10-CM | POA: Diagnosis not present

## 2019-11-29 DIAGNOSIS — N186 End stage renal disease: Secondary | ICD-10-CM | POA: Diagnosis not present

## 2019-11-29 DIAGNOSIS — Z992 Dependence on renal dialysis: Secondary | ICD-10-CM | POA: Diagnosis not present

## 2019-11-29 DIAGNOSIS — D631 Anemia in chronic kidney disease: Secondary | ICD-10-CM | POA: Diagnosis not present

## 2019-11-29 DIAGNOSIS — D509 Iron deficiency anemia, unspecified: Secondary | ICD-10-CM | POA: Diagnosis not present

## 2019-12-01 DIAGNOSIS — N186 End stage renal disease: Secondary | ICD-10-CM | POA: Diagnosis not present

## 2019-12-01 DIAGNOSIS — Z992 Dependence on renal dialysis: Secondary | ICD-10-CM | POA: Diagnosis not present

## 2019-12-01 DIAGNOSIS — D689 Coagulation defect, unspecified: Secondary | ICD-10-CM | POA: Diagnosis not present

## 2019-12-01 DIAGNOSIS — N2581 Secondary hyperparathyroidism of renal origin: Secondary | ICD-10-CM | POA: Diagnosis not present

## 2019-12-01 DIAGNOSIS — D631 Anemia in chronic kidney disease: Secondary | ICD-10-CM | POA: Diagnosis not present

## 2019-12-01 DIAGNOSIS — D509 Iron deficiency anemia, unspecified: Secondary | ICD-10-CM | POA: Diagnosis not present

## 2019-12-04 DIAGNOSIS — D509 Iron deficiency anemia, unspecified: Secondary | ICD-10-CM | POA: Diagnosis not present

## 2019-12-04 DIAGNOSIS — D689 Coagulation defect, unspecified: Secondary | ICD-10-CM | POA: Diagnosis not present

## 2019-12-04 DIAGNOSIS — Z992 Dependence on renal dialysis: Secondary | ICD-10-CM | POA: Diagnosis not present

## 2019-12-04 DIAGNOSIS — N186 End stage renal disease: Secondary | ICD-10-CM | POA: Diagnosis not present

## 2019-12-04 DIAGNOSIS — D631 Anemia in chronic kidney disease: Secondary | ICD-10-CM | POA: Diagnosis not present

## 2019-12-04 DIAGNOSIS — N2581 Secondary hyperparathyroidism of renal origin: Secondary | ICD-10-CM | POA: Diagnosis not present

## 2019-12-06 DIAGNOSIS — Z992 Dependence on renal dialysis: Secondary | ICD-10-CM | POA: Diagnosis not present

## 2019-12-06 DIAGNOSIS — D631 Anemia in chronic kidney disease: Secondary | ICD-10-CM | POA: Diagnosis not present

## 2019-12-06 DIAGNOSIS — N2581 Secondary hyperparathyroidism of renal origin: Secondary | ICD-10-CM | POA: Diagnosis not present

## 2019-12-06 DIAGNOSIS — D509 Iron deficiency anemia, unspecified: Secondary | ICD-10-CM | POA: Diagnosis not present

## 2019-12-06 DIAGNOSIS — N186 End stage renal disease: Secondary | ICD-10-CM | POA: Diagnosis not present

## 2019-12-06 DIAGNOSIS — D689 Coagulation defect, unspecified: Secondary | ICD-10-CM | POA: Diagnosis not present

## 2019-12-08 DIAGNOSIS — D689 Coagulation defect, unspecified: Secondary | ICD-10-CM | POA: Diagnosis not present

## 2019-12-08 DIAGNOSIS — N2581 Secondary hyperparathyroidism of renal origin: Secondary | ICD-10-CM | POA: Diagnosis not present

## 2019-12-08 DIAGNOSIS — Z992 Dependence on renal dialysis: Secondary | ICD-10-CM | POA: Diagnosis not present

## 2019-12-08 DIAGNOSIS — D509 Iron deficiency anemia, unspecified: Secondary | ICD-10-CM | POA: Diagnosis not present

## 2019-12-08 DIAGNOSIS — D631 Anemia in chronic kidney disease: Secondary | ICD-10-CM | POA: Diagnosis not present

## 2019-12-08 DIAGNOSIS — N186 End stage renal disease: Secondary | ICD-10-CM | POA: Diagnosis not present

## 2019-12-10 DIAGNOSIS — E1122 Type 2 diabetes mellitus with diabetic chronic kidney disease: Secondary | ICD-10-CM | POA: Diagnosis not present

## 2019-12-10 DIAGNOSIS — Z992 Dependence on renal dialysis: Secondary | ICD-10-CM | POA: Diagnosis not present

## 2019-12-10 DIAGNOSIS — N186 End stage renal disease: Secondary | ICD-10-CM | POA: Diagnosis not present

## 2019-12-11 ENCOUNTER — Other Ambulatory Visit: Payer: Self-pay | Admitting: Internal Medicine

## 2019-12-11 DIAGNOSIS — E1129 Type 2 diabetes mellitus with other diabetic kidney complication: Secondary | ICD-10-CM | POA: Diagnosis not present

## 2019-12-11 DIAGNOSIS — N2581 Secondary hyperparathyroidism of renal origin: Secondary | ICD-10-CM | POA: Diagnosis not present

## 2019-12-11 DIAGNOSIS — N186 End stage renal disease: Secondary | ICD-10-CM | POA: Diagnosis not present

## 2019-12-11 DIAGNOSIS — Z992 Dependence on renal dialysis: Secondary | ICD-10-CM | POA: Diagnosis not present

## 2019-12-11 DIAGNOSIS — D689 Coagulation defect, unspecified: Secondary | ICD-10-CM | POA: Diagnosis not present

## 2019-12-11 DIAGNOSIS — R52 Pain, unspecified: Secondary | ICD-10-CM | POA: Diagnosis not present

## 2019-12-11 DIAGNOSIS — D509 Iron deficiency anemia, unspecified: Secondary | ICD-10-CM | POA: Diagnosis not present

## 2019-12-13 DIAGNOSIS — D689 Coagulation defect, unspecified: Secondary | ICD-10-CM | POA: Diagnosis not present

## 2019-12-13 DIAGNOSIS — N2581 Secondary hyperparathyroidism of renal origin: Secondary | ICD-10-CM | POA: Diagnosis not present

## 2019-12-13 DIAGNOSIS — N186 End stage renal disease: Secondary | ICD-10-CM | POA: Diagnosis not present

## 2019-12-13 DIAGNOSIS — E1129 Type 2 diabetes mellitus with other diabetic kidney complication: Secondary | ICD-10-CM | POA: Diagnosis not present

## 2019-12-13 DIAGNOSIS — Z992 Dependence on renal dialysis: Secondary | ICD-10-CM | POA: Diagnosis not present

## 2019-12-13 DIAGNOSIS — D509 Iron deficiency anemia, unspecified: Secondary | ICD-10-CM | POA: Diagnosis not present

## 2019-12-13 DIAGNOSIS — R52 Pain, unspecified: Secondary | ICD-10-CM | POA: Diagnosis not present

## 2019-12-15 DIAGNOSIS — R52 Pain, unspecified: Secondary | ICD-10-CM | POA: Diagnosis not present

## 2019-12-15 DIAGNOSIS — N2581 Secondary hyperparathyroidism of renal origin: Secondary | ICD-10-CM | POA: Diagnosis not present

## 2019-12-15 DIAGNOSIS — D509 Iron deficiency anemia, unspecified: Secondary | ICD-10-CM | POA: Diagnosis not present

## 2019-12-15 DIAGNOSIS — Z992 Dependence on renal dialysis: Secondary | ICD-10-CM | POA: Diagnosis not present

## 2019-12-15 DIAGNOSIS — D689 Coagulation defect, unspecified: Secondary | ICD-10-CM | POA: Diagnosis not present

## 2019-12-15 DIAGNOSIS — E1129 Type 2 diabetes mellitus with other diabetic kidney complication: Secondary | ICD-10-CM | POA: Diagnosis not present

## 2019-12-15 DIAGNOSIS — N186 End stage renal disease: Secondary | ICD-10-CM | POA: Diagnosis not present

## 2019-12-17 ENCOUNTER — Other Ambulatory Visit: Payer: Self-pay | Admitting: Internal Medicine

## 2019-12-17 NOTE — Telephone Encounter (Signed)
Please refill as per office routine med refill policy (all routine meds refilled for 3 mo or monthly per pt preference up to one year from last visit, then month to month grace period for 3 mo, then further med refills will have to be denied)  

## 2019-12-18 DIAGNOSIS — D689 Coagulation defect, unspecified: Secondary | ICD-10-CM | POA: Diagnosis not present

## 2019-12-18 DIAGNOSIS — N2581 Secondary hyperparathyroidism of renal origin: Secondary | ICD-10-CM | POA: Diagnosis not present

## 2019-12-18 DIAGNOSIS — D509 Iron deficiency anemia, unspecified: Secondary | ICD-10-CM | POA: Diagnosis not present

## 2019-12-18 DIAGNOSIS — E1129 Type 2 diabetes mellitus with other diabetic kidney complication: Secondary | ICD-10-CM | POA: Diagnosis not present

## 2019-12-18 DIAGNOSIS — R52 Pain, unspecified: Secondary | ICD-10-CM | POA: Diagnosis not present

## 2019-12-18 DIAGNOSIS — N186 End stage renal disease: Secondary | ICD-10-CM | POA: Diagnosis not present

## 2019-12-18 DIAGNOSIS — Z992 Dependence on renal dialysis: Secondary | ICD-10-CM | POA: Diagnosis not present

## 2019-12-20 DIAGNOSIS — N186 End stage renal disease: Secondary | ICD-10-CM | POA: Diagnosis not present

## 2019-12-20 DIAGNOSIS — D689 Coagulation defect, unspecified: Secondary | ICD-10-CM | POA: Diagnosis not present

## 2019-12-20 DIAGNOSIS — D509 Iron deficiency anemia, unspecified: Secondary | ICD-10-CM | POA: Diagnosis not present

## 2019-12-20 DIAGNOSIS — Z992 Dependence on renal dialysis: Secondary | ICD-10-CM | POA: Diagnosis not present

## 2019-12-20 DIAGNOSIS — E1129 Type 2 diabetes mellitus with other diabetic kidney complication: Secondary | ICD-10-CM | POA: Diagnosis not present

## 2019-12-20 DIAGNOSIS — R52 Pain, unspecified: Secondary | ICD-10-CM | POA: Diagnosis not present

## 2019-12-20 DIAGNOSIS — N2581 Secondary hyperparathyroidism of renal origin: Secondary | ICD-10-CM | POA: Diagnosis not present

## 2019-12-22 DIAGNOSIS — D509 Iron deficiency anemia, unspecified: Secondary | ICD-10-CM | POA: Diagnosis not present

## 2019-12-22 DIAGNOSIS — E1129 Type 2 diabetes mellitus with other diabetic kidney complication: Secondary | ICD-10-CM | POA: Diagnosis not present

## 2019-12-22 DIAGNOSIS — D689 Coagulation defect, unspecified: Secondary | ICD-10-CM | POA: Diagnosis not present

## 2019-12-22 DIAGNOSIS — Z992 Dependence on renal dialysis: Secondary | ICD-10-CM | POA: Diagnosis not present

## 2019-12-22 DIAGNOSIS — R52 Pain, unspecified: Secondary | ICD-10-CM | POA: Diagnosis not present

## 2019-12-22 DIAGNOSIS — N2581 Secondary hyperparathyroidism of renal origin: Secondary | ICD-10-CM | POA: Diagnosis not present

## 2019-12-22 DIAGNOSIS — N186 End stage renal disease: Secondary | ICD-10-CM | POA: Diagnosis not present

## 2019-12-25 DIAGNOSIS — D509 Iron deficiency anemia, unspecified: Secondary | ICD-10-CM | POA: Diagnosis not present

## 2019-12-25 DIAGNOSIS — R52 Pain, unspecified: Secondary | ICD-10-CM | POA: Diagnosis not present

## 2019-12-25 DIAGNOSIS — D689 Coagulation defect, unspecified: Secondary | ICD-10-CM | POA: Diagnosis not present

## 2019-12-25 DIAGNOSIS — Z992 Dependence on renal dialysis: Secondary | ICD-10-CM | POA: Diagnosis not present

## 2019-12-25 DIAGNOSIS — E1129 Type 2 diabetes mellitus with other diabetic kidney complication: Secondary | ICD-10-CM | POA: Diagnosis not present

## 2019-12-25 DIAGNOSIS — N186 End stage renal disease: Secondary | ICD-10-CM | POA: Diagnosis not present

## 2019-12-25 DIAGNOSIS — N2581 Secondary hyperparathyroidism of renal origin: Secondary | ICD-10-CM | POA: Diagnosis not present

## 2019-12-27 DIAGNOSIS — D689 Coagulation defect, unspecified: Secondary | ICD-10-CM | POA: Diagnosis not present

## 2019-12-27 DIAGNOSIS — D509 Iron deficiency anemia, unspecified: Secondary | ICD-10-CM | POA: Diagnosis not present

## 2019-12-27 DIAGNOSIS — N2581 Secondary hyperparathyroidism of renal origin: Secondary | ICD-10-CM | POA: Diagnosis not present

## 2019-12-27 DIAGNOSIS — N186 End stage renal disease: Secondary | ICD-10-CM | POA: Diagnosis not present

## 2019-12-27 DIAGNOSIS — R52 Pain, unspecified: Secondary | ICD-10-CM | POA: Diagnosis not present

## 2019-12-27 DIAGNOSIS — E1129 Type 2 diabetes mellitus with other diabetic kidney complication: Secondary | ICD-10-CM | POA: Diagnosis not present

## 2019-12-27 DIAGNOSIS — Z992 Dependence on renal dialysis: Secondary | ICD-10-CM | POA: Diagnosis not present

## 2019-12-29 DIAGNOSIS — D509 Iron deficiency anemia, unspecified: Secondary | ICD-10-CM | POA: Diagnosis not present

## 2019-12-29 DIAGNOSIS — E1129 Type 2 diabetes mellitus with other diabetic kidney complication: Secondary | ICD-10-CM | POA: Diagnosis not present

## 2019-12-29 DIAGNOSIS — R52 Pain, unspecified: Secondary | ICD-10-CM | POA: Diagnosis not present

## 2019-12-29 DIAGNOSIS — Z992 Dependence on renal dialysis: Secondary | ICD-10-CM | POA: Diagnosis not present

## 2019-12-29 DIAGNOSIS — N186 End stage renal disease: Secondary | ICD-10-CM | POA: Diagnosis not present

## 2019-12-29 DIAGNOSIS — N2581 Secondary hyperparathyroidism of renal origin: Secondary | ICD-10-CM | POA: Diagnosis not present

## 2019-12-29 DIAGNOSIS — D689 Coagulation defect, unspecified: Secondary | ICD-10-CM | POA: Diagnosis not present

## 2020-01-01 DIAGNOSIS — N186 End stage renal disease: Secondary | ICD-10-CM | POA: Diagnosis not present

## 2020-01-01 DIAGNOSIS — D509 Iron deficiency anemia, unspecified: Secondary | ICD-10-CM | POA: Diagnosis not present

## 2020-01-01 DIAGNOSIS — Z992 Dependence on renal dialysis: Secondary | ICD-10-CM | POA: Diagnosis not present

## 2020-01-01 DIAGNOSIS — R52 Pain, unspecified: Secondary | ICD-10-CM | POA: Insufficient documentation

## 2020-01-01 DIAGNOSIS — N2581 Secondary hyperparathyroidism of renal origin: Secondary | ICD-10-CM | POA: Diagnosis not present

## 2020-01-01 DIAGNOSIS — E1129 Type 2 diabetes mellitus with other diabetic kidney complication: Secondary | ICD-10-CM | POA: Diagnosis not present

## 2020-01-01 DIAGNOSIS — D689 Coagulation defect, unspecified: Secondary | ICD-10-CM | POA: Diagnosis not present

## 2020-01-03 DIAGNOSIS — D689 Coagulation defect, unspecified: Secondary | ICD-10-CM | POA: Diagnosis not present

## 2020-01-03 DIAGNOSIS — R52 Pain, unspecified: Secondary | ICD-10-CM | POA: Diagnosis not present

## 2020-01-03 DIAGNOSIS — N2581 Secondary hyperparathyroidism of renal origin: Secondary | ICD-10-CM | POA: Diagnosis not present

## 2020-01-03 DIAGNOSIS — D509 Iron deficiency anemia, unspecified: Secondary | ICD-10-CM | POA: Diagnosis not present

## 2020-01-03 DIAGNOSIS — E1129 Type 2 diabetes mellitus with other diabetic kidney complication: Secondary | ICD-10-CM | POA: Diagnosis not present

## 2020-01-03 DIAGNOSIS — N186 End stage renal disease: Secondary | ICD-10-CM | POA: Diagnosis not present

## 2020-01-03 DIAGNOSIS — Z992 Dependence on renal dialysis: Secondary | ICD-10-CM | POA: Diagnosis not present

## 2020-01-05 DIAGNOSIS — N186 End stage renal disease: Secondary | ICD-10-CM | POA: Diagnosis not present

## 2020-01-05 DIAGNOSIS — D509 Iron deficiency anemia, unspecified: Secondary | ICD-10-CM | POA: Diagnosis not present

## 2020-01-05 DIAGNOSIS — N2581 Secondary hyperparathyroidism of renal origin: Secondary | ICD-10-CM | POA: Diagnosis not present

## 2020-01-05 DIAGNOSIS — R52 Pain, unspecified: Secondary | ICD-10-CM | POA: Diagnosis not present

## 2020-01-05 DIAGNOSIS — D689 Coagulation defect, unspecified: Secondary | ICD-10-CM | POA: Diagnosis not present

## 2020-01-05 DIAGNOSIS — Z992 Dependence on renal dialysis: Secondary | ICD-10-CM | POA: Diagnosis not present

## 2020-01-05 DIAGNOSIS — E1129 Type 2 diabetes mellitus with other diabetic kidney complication: Secondary | ICD-10-CM | POA: Diagnosis not present

## 2020-01-08 DIAGNOSIS — E1129 Type 2 diabetes mellitus with other diabetic kidney complication: Secondary | ICD-10-CM | POA: Diagnosis not present

## 2020-01-08 DIAGNOSIS — Z992 Dependence on renal dialysis: Secondary | ICD-10-CM | POA: Diagnosis not present

## 2020-01-08 DIAGNOSIS — R52 Pain, unspecified: Secondary | ICD-10-CM | POA: Diagnosis not present

## 2020-01-08 DIAGNOSIS — N186 End stage renal disease: Secondary | ICD-10-CM | POA: Diagnosis not present

## 2020-01-08 DIAGNOSIS — D689 Coagulation defect, unspecified: Secondary | ICD-10-CM | POA: Diagnosis not present

## 2020-01-08 DIAGNOSIS — N2581 Secondary hyperparathyroidism of renal origin: Secondary | ICD-10-CM | POA: Diagnosis not present

## 2020-01-08 DIAGNOSIS — D509 Iron deficiency anemia, unspecified: Secondary | ICD-10-CM | POA: Diagnosis not present

## 2020-01-09 DIAGNOSIS — Z992 Dependence on renal dialysis: Secondary | ICD-10-CM | POA: Diagnosis not present

## 2020-01-09 DIAGNOSIS — N186 End stage renal disease: Secondary | ICD-10-CM | POA: Diagnosis not present

## 2020-01-09 DIAGNOSIS — E1122 Type 2 diabetes mellitus with diabetic chronic kidney disease: Secondary | ICD-10-CM | POA: Diagnosis not present

## 2020-01-10 DIAGNOSIS — Z992 Dependence on renal dialysis: Secondary | ICD-10-CM | POA: Diagnosis not present

## 2020-01-10 DIAGNOSIS — D509 Iron deficiency anemia, unspecified: Secondary | ICD-10-CM | POA: Diagnosis not present

## 2020-01-10 DIAGNOSIS — E1129 Type 2 diabetes mellitus with other diabetic kidney complication: Secondary | ICD-10-CM | POA: Diagnosis not present

## 2020-01-10 DIAGNOSIS — D689 Coagulation defect, unspecified: Secondary | ICD-10-CM | POA: Diagnosis not present

## 2020-01-10 DIAGNOSIS — N2581 Secondary hyperparathyroidism of renal origin: Secondary | ICD-10-CM | POA: Diagnosis not present

## 2020-01-10 DIAGNOSIS — N186 End stage renal disease: Secondary | ICD-10-CM | POA: Diagnosis not present

## 2020-01-12 DIAGNOSIS — N2581 Secondary hyperparathyroidism of renal origin: Secondary | ICD-10-CM | POA: Diagnosis not present

## 2020-01-12 DIAGNOSIS — E1129 Type 2 diabetes mellitus with other diabetic kidney complication: Secondary | ICD-10-CM | POA: Diagnosis not present

## 2020-01-12 DIAGNOSIS — D509 Iron deficiency anemia, unspecified: Secondary | ICD-10-CM | POA: Diagnosis not present

## 2020-01-12 DIAGNOSIS — Z992 Dependence on renal dialysis: Secondary | ICD-10-CM | POA: Diagnosis not present

## 2020-01-12 DIAGNOSIS — D689 Coagulation defect, unspecified: Secondary | ICD-10-CM | POA: Diagnosis not present

## 2020-01-12 DIAGNOSIS — N186 End stage renal disease: Secondary | ICD-10-CM | POA: Diagnosis not present

## 2020-01-15 DIAGNOSIS — D689 Coagulation defect, unspecified: Secondary | ICD-10-CM | POA: Diagnosis not present

## 2020-01-15 DIAGNOSIS — N186 End stage renal disease: Secondary | ICD-10-CM | POA: Diagnosis not present

## 2020-01-15 DIAGNOSIS — Z992 Dependence on renal dialysis: Secondary | ICD-10-CM | POA: Diagnosis not present

## 2020-01-15 DIAGNOSIS — E1129 Type 2 diabetes mellitus with other diabetic kidney complication: Secondary | ICD-10-CM | POA: Diagnosis not present

## 2020-01-15 DIAGNOSIS — D509 Iron deficiency anemia, unspecified: Secondary | ICD-10-CM | POA: Diagnosis not present

## 2020-01-15 DIAGNOSIS — N2581 Secondary hyperparathyroidism of renal origin: Secondary | ICD-10-CM | POA: Diagnosis not present

## 2020-01-16 ENCOUNTER — Encounter: Payer: Self-pay | Admitting: Podiatry

## 2020-01-16 ENCOUNTER — Other Ambulatory Visit: Payer: Self-pay

## 2020-01-16 ENCOUNTER — Ambulatory Visit: Payer: Medicare Other | Admitting: Podiatry

## 2020-01-16 DIAGNOSIS — B351 Tinea unguium: Secondary | ICD-10-CM | POA: Diagnosis not present

## 2020-01-16 DIAGNOSIS — M79676 Pain in unspecified toe(s): Secondary | ICD-10-CM

## 2020-01-16 DIAGNOSIS — L280 Lichen simplex chronicus: Secondary | ICD-10-CM

## 2020-01-16 DIAGNOSIS — Q828 Other specified congenital malformations of skin: Secondary | ICD-10-CM

## 2020-01-16 DIAGNOSIS — E1142 Type 2 diabetes mellitus with diabetic polyneuropathy: Secondary | ICD-10-CM

## 2020-01-16 NOTE — Progress Notes (Signed)
This patient returns to my office for at risk foot care.  This patient requires this care by a professional since this patient will be at risk due to having  ESRD, CKD and diabetes.  This patient is unable to cut nails himself since the patient cannot reach his nails.These nails are painful walking and wearing shoes.  This patient presents for at risk foot care today.  General Appearance  Alert, conversant and in no acute stress.  Vascular  Dorsalis pedis and posterior tibial  pulses are palpable  bilaterally.  Capillary return is within normal limits  bilaterally. Temperature is within normal limits  bilaterally.  Neurologic  Senn-Weinstein monofilament wire test diminished   bilaterally. Muscle power within normal limits bilaterally.  Nails Thick disfigured discolored nails with subungual debris  from hallux to fifth toes bilaterally. No evidence of bacterial infection or drainage bilaterally.  Orthopedic  No limitations of motion  feet .  No crepitus or effusions noted.  No bony pathology or digital deformities noted.  Skin  normotropic skin  noted bilaterally.  No signs of infections or ulcers noted.   Porokeratosis sub 4  B/L asymptomatic.  Onychomycosis  Pain in right toes  Pain in left toes  Consent was obtained for treatment procedures.   Mechanical debridement of nails 1-5  bilaterally performed with a nail nipper.  Filed with dremel without incident.  Debride porokeratosis with # 15 blade.   Return office visit   10 weeks                  Told patient to return for periodic foot care and evaluation due to potential at risk complications.   Gardiner Barefoot DPM

## 2020-01-17 DIAGNOSIS — N2581 Secondary hyperparathyroidism of renal origin: Secondary | ICD-10-CM | POA: Diagnosis not present

## 2020-01-17 DIAGNOSIS — E1129 Type 2 diabetes mellitus with other diabetic kidney complication: Secondary | ICD-10-CM | POA: Diagnosis not present

## 2020-01-17 DIAGNOSIS — Z992 Dependence on renal dialysis: Secondary | ICD-10-CM | POA: Diagnosis not present

## 2020-01-17 DIAGNOSIS — D689 Coagulation defect, unspecified: Secondary | ICD-10-CM | POA: Diagnosis not present

## 2020-01-17 DIAGNOSIS — D509 Iron deficiency anemia, unspecified: Secondary | ICD-10-CM | POA: Diagnosis not present

## 2020-01-17 DIAGNOSIS — N186 End stage renal disease: Secondary | ICD-10-CM | POA: Diagnosis not present

## 2020-01-19 DIAGNOSIS — Z992 Dependence on renal dialysis: Secondary | ICD-10-CM | POA: Diagnosis not present

## 2020-01-19 DIAGNOSIS — E1129 Type 2 diabetes mellitus with other diabetic kidney complication: Secondary | ICD-10-CM | POA: Diagnosis not present

## 2020-01-19 DIAGNOSIS — N186 End stage renal disease: Secondary | ICD-10-CM | POA: Diagnosis not present

## 2020-01-19 DIAGNOSIS — D689 Coagulation defect, unspecified: Secondary | ICD-10-CM | POA: Diagnosis not present

## 2020-01-19 DIAGNOSIS — D509 Iron deficiency anemia, unspecified: Secondary | ICD-10-CM | POA: Diagnosis not present

## 2020-01-19 DIAGNOSIS — N2581 Secondary hyperparathyroidism of renal origin: Secondary | ICD-10-CM | POA: Diagnosis not present

## 2020-01-22 DIAGNOSIS — N2581 Secondary hyperparathyroidism of renal origin: Secondary | ICD-10-CM | POA: Diagnosis not present

## 2020-01-22 DIAGNOSIS — E1129 Type 2 diabetes mellitus with other diabetic kidney complication: Secondary | ICD-10-CM | POA: Diagnosis not present

## 2020-01-22 DIAGNOSIS — N186 End stage renal disease: Secondary | ICD-10-CM | POA: Diagnosis not present

## 2020-01-22 DIAGNOSIS — D689 Coagulation defect, unspecified: Secondary | ICD-10-CM | POA: Diagnosis not present

## 2020-01-22 DIAGNOSIS — Z992 Dependence on renal dialysis: Secondary | ICD-10-CM | POA: Diagnosis not present

## 2020-01-22 DIAGNOSIS — D509 Iron deficiency anemia, unspecified: Secondary | ICD-10-CM | POA: Diagnosis not present

## 2020-01-24 DIAGNOSIS — N2581 Secondary hyperparathyroidism of renal origin: Secondary | ICD-10-CM | POA: Diagnosis not present

## 2020-01-24 DIAGNOSIS — D689 Coagulation defect, unspecified: Secondary | ICD-10-CM | POA: Diagnosis not present

## 2020-01-24 DIAGNOSIS — D509 Iron deficiency anemia, unspecified: Secondary | ICD-10-CM | POA: Diagnosis not present

## 2020-01-24 DIAGNOSIS — N186 End stage renal disease: Secondary | ICD-10-CM | POA: Diagnosis not present

## 2020-01-24 DIAGNOSIS — Z992 Dependence on renal dialysis: Secondary | ICD-10-CM | POA: Diagnosis not present

## 2020-01-24 DIAGNOSIS — E1129 Type 2 diabetes mellitus with other diabetic kidney complication: Secondary | ICD-10-CM | POA: Diagnosis not present

## 2020-01-26 DIAGNOSIS — N2581 Secondary hyperparathyroidism of renal origin: Secondary | ICD-10-CM | POA: Diagnosis not present

## 2020-01-26 DIAGNOSIS — Z992 Dependence on renal dialysis: Secondary | ICD-10-CM | POA: Diagnosis not present

## 2020-01-26 DIAGNOSIS — D689 Coagulation defect, unspecified: Secondary | ICD-10-CM | POA: Diagnosis not present

## 2020-01-26 DIAGNOSIS — D509 Iron deficiency anemia, unspecified: Secondary | ICD-10-CM | POA: Diagnosis not present

## 2020-01-26 DIAGNOSIS — N186 End stage renal disease: Secondary | ICD-10-CM | POA: Diagnosis not present

## 2020-01-26 DIAGNOSIS — E1129 Type 2 diabetes mellitus with other diabetic kidney complication: Secondary | ICD-10-CM | POA: Diagnosis not present

## 2020-01-29 DIAGNOSIS — E1129 Type 2 diabetes mellitus with other diabetic kidney complication: Secondary | ICD-10-CM | POA: Diagnosis not present

## 2020-01-29 DIAGNOSIS — N186 End stage renal disease: Secondary | ICD-10-CM | POA: Diagnosis not present

## 2020-01-29 DIAGNOSIS — N2581 Secondary hyperparathyroidism of renal origin: Secondary | ICD-10-CM | POA: Diagnosis not present

## 2020-01-29 DIAGNOSIS — Z992 Dependence on renal dialysis: Secondary | ICD-10-CM | POA: Diagnosis not present

## 2020-01-29 DIAGNOSIS — D689 Coagulation defect, unspecified: Secondary | ICD-10-CM | POA: Diagnosis not present

## 2020-01-29 DIAGNOSIS — D509 Iron deficiency anemia, unspecified: Secondary | ICD-10-CM | POA: Diagnosis not present

## 2020-01-31 DIAGNOSIS — D509 Iron deficiency anemia, unspecified: Secondary | ICD-10-CM | POA: Diagnosis not present

## 2020-01-31 DIAGNOSIS — E1129 Type 2 diabetes mellitus with other diabetic kidney complication: Secondary | ICD-10-CM | POA: Diagnosis not present

## 2020-01-31 DIAGNOSIS — N186 End stage renal disease: Secondary | ICD-10-CM | POA: Diagnosis not present

## 2020-01-31 DIAGNOSIS — N2581 Secondary hyperparathyroidism of renal origin: Secondary | ICD-10-CM | POA: Diagnosis not present

## 2020-01-31 DIAGNOSIS — Z992 Dependence on renal dialysis: Secondary | ICD-10-CM | POA: Diagnosis not present

## 2020-01-31 DIAGNOSIS — D689 Coagulation defect, unspecified: Secondary | ICD-10-CM | POA: Diagnosis not present

## 2020-02-02 DIAGNOSIS — N186 End stage renal disease: Secondary | ICD-10-CM | POA: Diagnosis not present

## 2020-02-02 DIAGNOSIS — D509 Iron deficiency anemia, unspecified: Secondary | ICD-10-CM | POA: Diagnosis not present

## 2020-02-02 DIAGNOSIS — E1129 Type 2 diabetes mellitus with other diabetic kidney complication: Secondary | ICD-10-CM | POA: Diagnosis not present

## 2020-02-02 DIAGNOSIS — Z992 Dependence on renal dialysis: Secondary | ICD-10-CM | POA: Diagnosis not present

## 2020-02-02 DIAGNOSIS — D689 Coagulation defect, unspecified: Secondary | ICD-10-CM | POA: Diagnosis not present

## 2020-02-02 DIAGNOSIS — N2581 Secondary hyperparathyroidism of renal origin: Secondary | ICD-10-CM | POA: Diagnosis not present

## 2020-02-04 ENCOUNTER — Other Ambulatory Visit: Payer: Self-pay | Admitting: Internal Medicine

## 2020-02-04 NOTE — Telephone Encounter (Signed)
Please refill as per office routine med refill policy (all routine meds refilled for 3 mo or monthly per pt preference up to one year from last visit, then month to month grace period for 3 mo, then further med refills will have to be denied)  

## 2020-02-05 DIAGNOSIS — Z992 Dependence on renal dialysis: Secondary | ICD-10-CM | POA: Diagnosis not present

## 2020-02-05 DIAGNOSIS — E1129 Type 2 diabetes mellitus with other diabetic kidney complication: Secondary | ICD-10-CM | POA: Diagnosis not present

## 2020-02-05 DIAGNOSIS — N2581 Secondary hyperparathyroidism of renal origin: Secondary | ICD-10-CM | POA: Diagnosis not present

## 2020-02-05 DIAGNOSIS — N186 End stage renal disease: Secondary | ICD-10-CM | POA: Diagnosis not present

## 2020-02-05 DIAGNOSIS — D689 Coagulation defect, unspecified: Secondary | ICD-10-CM | POA: Diagnosis not present

## 2020-02-05 DIAGNOSIS — D509 Iron deficiency anemia, unspecified: Secondary | ICD-10-CM | POA: Diagnosis not present

## 2020-02-07 DIAGNOSIS — Z992 Dependence on renal dialysis: Secondary | ICD-10-CM | POA: Diagnosis not present

## 2020-02-07 DIAGNOSIS — E1129 Type 2 diabetes mellitus with other diabetic kidney complication: Secondary | ICD-10-CM | POA: Diagnosis not present

## 2020-02-07 DIAGNOSIS — N2581 Secondary hyperparathyroidism of renal origin: Secondary | ICD-10-CM | POA: Diagnosis not present

## 2020-02-07 DIAGNOSIS — N186 End stage renal disease: Secondary | ICD-10-CM | POA: Diagnosis not present

## 2020-02-07 DIAGNOSIS — D509 Iron deficiency anemia, unspecified: Secondary | ICD-10-CM | POA: Diagnosis not present

## 2020-02-07 DIAGNOSIS — D689 Coagulation defect, unspecified: Secondary | ICD-10-CM | POA: Diagnosis not present

## 2020-02-08 ENCOUNTER — Ambulatory Visit: Payer: Medicare Other | Admitting: Podiatry

## 2020-02-09 DIAGNOSIS — Z992 Dependence on renal dialysis: Secondary | ICD-10-CM | POA: Diagnosis not present

## 2020-02-09 DIAGNOSIS — E1129 Type 2 diabetes mellitus with other diabetic kidney complication: Secondary | ICD-10-CM | POA: Diagnosis not present

## 2020-02-09 DIAGNOSIS — N186 End stage renal disease: Secondary | ICD-10-CM | POA: Diagnosis not present

## 2020-02-09 DIAGNOSIS — E1122 Type 2 diabetes mellitus with diabetic chronic kidney disease: Secondary | ICD-10-CM | POA: Diagnosis not present

## 2020-02-09 DIAGNOSIS — D689 Coagulation defect, unspecified: Secondary | ICD-10-CM | POA: Diagnosis not present

## 2020-02-09 DIAGNOSIS — D509 Iron deficiency anemia, unspecified: Secondary | ICD-10-CM | POA: Diagnosis not present

## 2020-02-09 DIAGNOSIS — N2581 Secondary hyperparathyroidism of renal origin: Secondary | ICD-10-CM | POA: Diagnosis not present

## 2020-02-12 ENCOUNTER — Other Ambulatory Visit: Payer: Self-pay | Admitting: Internal Medicine

## 2020-02-12 DIAGNOSIS — D689 Coagulation defect, unspecified: Secondary | ICD-10-CM | POA: Diagnosis not present

## 2020-02-12 DIAGNOSIS — E1129 Type 2 diabetes mellitus with other diabetic kidney complication: Secondary | ICD-10-CM | POA: Diagnosis not present

## 2020-02-12 DIAGNOSIS — R52 Pain, unspecified: Secondary | ICD-10-CM | POA: Diagnosis not present

## 2020-02-12 DIAGNOSIS — Z992 Dependence on renal dialysis: Secondary | ICD-10-CM | POA: Diagnosis not present

## 2020-02-12 DIAGNOSIS — N2581 Secondary hyperparathyroidism of renal origin: Secondary | ICD-10-CM | POA: Diagnosis not present

## 2020-02-12 DIAGNOSIS — N186 End stage renal disease: Secondary | ICD-10-CM | POA: Diagnosis not present

## 2020-02-12 NOTE — Telephone Encounter (Signed)
Please refill as per office routine med refill policy (all routine meds refilled for 3 mo or monthly per pt preference up to one year from last visit, then month to month grace period for 3 mo, then further med refills will have to be denied)  

## 2020-02-14 DIAGNOSIS — R52 Pain, unspecified: Secondary | ICD-10-CM | POA: Diagnosis not present

## 2020-02-14 DIAGNOSIS — N186 End stage renal disease: Secondary | ICD-10-CM | POA: Diagnosis not present

## 2020-02-14 DIAGNOSIS — Z992 Dependence on renal dialysis: Secondary | ICD-10-CM | POA: Diagnosis not present

## 2020-02-14 DIAGNOSIS — E1129 Type 2 diabetes mellitus with other diabetic kidney complication: Secondary | ICD-10-CM | POA: Diagnosis not present

## 2020-02-14 DIAGNOSIS — N2581 Secondary hyperparathyroidism of renal origin: Secondary | ICD-10-CM | POA: Diagnosis not present

## 2020-02-14 DIAGNOSIS — D689 Coagulation defect, unspecified: Secondary | ICD-10-CM | POA: Diagnosis not present

## 2020-02-16 DIAGNOSIS — N186 End stage renal disease: Secondary | ICD-10-CM | POA: Diagnosis not present

## 2020-02-16 DIAGNOSIS — E1129 Type 2 diabetes mellitus with other diabetic kidney complication: Secondary | ICD-10-CM | POA: Diagnosis not present

## 2020-02-16 DIAGNOSIS — R52 Pain, unspecified: Secondary | ICD-10-CM | POA: Diagnosis not present

## 2020-02-16 DIAGNOSIS — N2581 Secondary hyperparathyroidism of renal origin: Secondary | ICD-10-CM | POA: Diagnosis not present

## 2020-02-16 DIAGNOSIS — D689 Coagulation defect, unspecified: Secondary | ICD-10-CM | POA: Diagnosis not present

## 2020-02-16 DIAGNOSIS — Z992 Dependence on renal dialysis: Secondary | ICD-10-CM | POA: Diagnosis not present

## 2020-02-19 DIAGNOSIS — N186 End stage renal disease: Secondary | ICD-10-CM | POA: Diagnosis not present

## 2020-02-19 DIAGNOSIS — N2581 Secondary hyperparathyroidism of renal origin: Secondary | ICD-10-CM | POA: Diagnosis not present

## 2020-02-19 DIAGNOSIS — E1129 Type 2 diabetes mellitus with other diabetic kidney complication: Secondary | ICD-10-CM | POA: Diagnosis not present

## 2020-02-19 DIAGNOSIS — Z992 Dependence on renal dialysis: Secondary | ICD-10-CM | POA: Diagnosis not present

## 2020-02-19 DIAGNOSIS — R52 Pain, unspecified: Secondary | ICD-10-CM | POA: Diagnosis not present

## 2020-02-19 DIAGNOSIS — D689 Coagulation defect, unspecified: Secondary | ICD-10-CM | POA: Diagnosis not present

## 2020-02-21 DIAGNOSIS — N2581 Secondary hyperparathyroidism of renal origin: Secondary | ICD-10-CM | POA: Diagnosis not present

## 2020-02-21 DIAGNOSIS — N186 End stage renal disease: Secondary | ICD-10-CM | POA: Diagnosis not present

## 2020-02-21 DIAGNOSIS — D689 Coagulation defect, unspecified: Secondary | ICD-10-CM | POA: Diagnosis not present

## 2020-02-21 DIAGNOSIS — Z992 Dependence on renal dialysis: Secondary | ICD-10-CM | POA: Diagnosis not present

## 2020-02-21 DIAGNOSIS — R52 Pain, unspecified: Secondary | ICD-10-CM | POA: Diagnosis not present

## 2020-02-21 DIAGNOSIS — E1129 Type 2 diabetes mellitus with other diabetic kidney complication: Secondary | ICD-10-CM | POA: Diagnosis not present

## 2020-02-23 DIAGNOSIS — D689 Coagulation defect, unspecified: Secondary | ICD-10-CM | POA: Diagnosis not present

## 2020-02-23 DIAGNOSIS — N2581 Secondary hyperparathyroidism of renal origin: Secondary | ICD-10-CM | POA: Diagnosis not present

## 2020-02-23 DIAGNOSIS — E1129 Type 2 diabetes mellitus with other diabetic kidney complication: Secondary | ICD-10-CM | POA: Diagnosis not present

## 2020-02-23 DIAGNOSIS — R52 Pain, unspecified: Secondary | ICD-10-CM | POA: Diagnosis not present

## 2020-02-23 DIAGNOSIS — Z992 Dependence on renal dialysis: Secondary | ICD-10-CM | POA: Diagnosis not present

## 2020-02-23 DIAGNOSIS — N186 End stage renal disease: Secondary | ICD-10-CM | POA: Diagnosis not present

## 2020-02-26 DIAGNOSIS — Z992 Dependence on renal dialysis: Secondary | ICD-10-CM | POA: Diagnosis not present

## 2020-02-26 DIAGNOSIS — N2581 Secondary hyperparathyroidism of renal origin: Secondary | ICD-10-CM | POA: Diagnosis not present

## 2020-02-26 DIAGNOSIS — E1129 Type 2 diabetes mellitus with other diabetic kidney complication: Secondary | ICD-10-CM | POA: Diagnosis not present

## 2020-02-26 DIAGNOSIS — R52 Pain, unspecified: Secondary | ICD-10-CM | POA: Diagnosis not present

## 2020-02-26 DIAGNOSIS — N186 End stage renal disease: Secondary | ICD-10-CM | POA: Diagnosis not present

## 2020-02-26 DIAGNOSIS — D689 Coagulation defect, unspecified: Secondary | ICD-10-CM | POA: Diagnosis not present

## 2020-02-28 DIAGNOSIS — R52 Pain, unspecified: Secondary | ICD-10-CM | POA: Diagnosis not present

## 2020-02-28 DIAGNOSIS — D689 Coagulation defect, unspecified: Secondary | ICD-10-CM | POA: Diagnosis not present

## 2020-02-28 DIAGNOSIS — Z992 Dependence on renal dialysis: Secondary | ICD-10-CM | POA: Diagnosis not present

## 2020-02-28 DIAGNOSIS — N186 End stage renal disease: Secondary | ICD-10-CM | POA: Diagnosis not present

## 2020-02-28 DIAGNOSIS — E1129 Type 2 diabetes mellitus with other diabetic kidney complication: Secondary | ICD-10-CM | POA: Diagnosis not present

## 2020-02-28 DIAGNOSIS — N2581 Secondary hyperparathyroidism of renal origin: Secondary | ICD-10-CM | POA: Diagnosis not present

## 2020-03-01 DIAGNOSIS — E1129 Type 2 diabetes mellitus with other diabetic kidney complication: Secondary | ICD-10-CM | POA: Diagnosis not present

## 2020-03-01 DIAGNOSIS — N186 End stage renal disease: Secondary | ICD-10-CM | POA: Diagnosis not present

## 2020-03-01 DIAGNOSIS — R52 Pain, unspecified: Secondary | ICD-10-CM | POA: Diagnosis not present

## 2020-03-01 DIAGNOSIS — N2581 Secondary hyperparathyroidism of renal origin: Secondary | ICD-10-CM | POA: Diagnosis not present

## 2020-03-01 DIAGNOSIS — Z992 Dependence on renal dialysis: Secondary | ICD-10-CM | POA: Diagnosis not present

## 2020-03-01 DIAGNOSIS — D689 Coagulation defect, unspecified: Secondary | ICD-10-CM | POA: Diagnosis not present

## 2020-03-04 DIAGNOSIS — Z992 Dependence on renal dialysis: Secondary | ICD-10-CM | POA: Diagnosis not present

## 2020-03-04 DIAGNOSIS — N186 End stage renal disease: Secondary | ICD-10-CM | POA: Diagnosis not present

## 2020-03-04 DIAGNOSIS — R52 Pain, unspecified: Secondary | ICD-10-CM | POA: Diagnosis not present

## 2020-03-04 DIAGNOSIS — D689 Coagulation defect, unspecified: Secondary | ICD-10-CM | POA: Diagnosis not present

## 2020-03-04 DIAGNOSIS — E1129 Type 2 diabetes mellitus with other diabetic kidney complication: Secondary | ICD-10-CM | POA: Diagnosis not present

## 2020-03-04 DIAGNOSIS — N2581 Secondary hyperparathyroidism of renal origin: Secondary | ICD-10-CM | POA: Diagnosis not present

## 2020-03-06 DIAGNOSIS — Z992 Dependence on renal dialysis: Secondary | ICD-10-CM | POA: Diagnosis not present

## 2020-03-06 DIAGNOSIS — D689 Coagulation defect, unspecified: Secondary | ICD-10-CM | POA: Diagnosis not present

## 2020-03-06 DIAGNOSIS — R52 Pain, unspecified: Secondary | ICD-10-CM | POA: Diagnosis not present

## 2020-03-06 DIAGNOSIS — E1129 Type 2 diabetes mellitus with other diabetic kidney complication: Secondary | ICD-10-CM | POA: Diagnosis not present

## 2020-03-06 DIAGNOSIS — N2581 Secondary hyperparathyroidism of renal origin: Secondary | ICD-10-CM | POA: Diagnosis not present

## 2020-03-06 DIAGNOSIS — N186 End stage renal disease: Secondary | ICD-10-CM | POA: Diagnosis not present

## 2020-03-08 DIAGNOSIS — N186 End stage renal disease: Secondary | ICD-10-CM | POA: Diagnosis not present

## 2020-03-08 DIAGNOSIS — D689 Coagulation defect, unspecified: Secondary | ICD-10-CM | POA: Diagnosis not present

## 2020-03-08 DIAGNOSIS — Z992 Dependence on renal dialysis: Secondary | ICD-10-CM | POA: Diagnosis not present

## 2020-03-08 DIAGNOSIS — E1129 Type 2 diabetes mellitus with other diabetic kidney complication: Secondary | ICD-10-CM | POA: Diagnosis not present

## 2020-03-08 DIAGNOSIS — R52 Pain, unspecified: Secondary | ICD-10-CM | POA: Diagnosis not present

## 2020-03-08 DIAGNOSIS — N2581 Secondary hyperparathyroidism of renal origin: Secondary | ICD-10-CM | POA: Diagnosis not present

## 2020-03-11 DIAGNOSIS — E1129 Type 2 diabetes mellitus with other diabetic kidney complication: Secondary | ICD-10-CM | POA: Diagnosis not present

## 2020-03-11 DIAGNOSIS — N186 End stage renal disease: Secondary | ICD-10-CM | POA: Diagnosis not present

## 2020-03-11 DIAGNOSIS — D689 Coagulation defect, unspecified: Secondary | ICD-10-CM | POA: Diagnosis not present

## 2020-03-11 DIAGNOSIS — E1122 Type 2 diabetes mellitus with diabetic chronic kidney disease: Secondary | ICD-10-CM | POA: Diagnosis not present

## 2020-03-11 DIAGNOSIS — R52 Pain, unspecified: Secondary | ICD-10-CM | POA: Diagnosis not present

## 2020-03-11 DIAGNOSIS — N2581 Secondary hyperparathyroidism of renal origin: Secondary | ICD-10-CM | POA: Diagnosis not present

## 2020-03-11 DIAGNOSIS — Z992 Dependence on renal dialysis: Secondary | ICD-10-CM | POA: Diagnosis not present

## 2020-03-13 DIAGNOSIS — D689 Coagulation defect, unspecified: Secondary | ICD-10-CM | POA: Diagnosis not present

## 2020-03-13 DIAGNOSIS — D631 Anemia in chronic kidney disease: Secondary | ICD-10-CM | POA: Diagnosis not present

## 2020-03-13 DIAGNOSIS — N186 End stage renal disease: Secondary | ICD-10-CM | POA: Diagnosis not present

## 2020-03-13 DIAGNOSIS — N2581 Secondary hyperparathyroidism of renal origin: Secondary | ICD-10-CM | POA: Diagnosis not present

## 2020-03-13 DIAGNOSIS — Z992 Dependence on renal dialysis: Secondary | ICD-10-CM | POA: Diagnosis not present

## 2020-03-15 DIAGNOSIS — N2581 Secondary hyperparathyroidism of renal origin: Secondary | ICD-10-CM | POA: Diagnosis not present

## 2020-03-15 DIAGNOSIS — Z992 Dependence on renal dialysis: Secondary | ICD-10-CM | POA: Diagnosis not present

## 2020-03-15 DIAGNOSIS — D689 Coagulation defect, unspecified: Secondary | ICD-10-CM | POA: Diagnosis not present

## 2020-03-15 DIAGNOSIS — D631 Anemia in chronic kidney disease: Secondary | ICD-10-CM | POA: Diagnosis not present

## 2020-03-15 DIAGNOSIS — N186 End stage renal disease: Secondary | ICD-10-CM | POA: Diagnosis not present

## 2020-03-18 DIAGNOSIS — N186 End stage renal disease: Secondary | ICD-10-CM | POA: Diagnosis not present

## 2020-03-18 DIAGNOSIS — D631 Anemia in chronic kidney disease: Secondary | ICD-10-CM | POA: Diagnosis not present

## 2020-03-18 DIAGNOSIS — N2581 Secondary hyperparathyroidism of renal origin: Secondary | ICD-10-CM | POA: Diagnosis not present

## 2020-03-18 DIAGNOSIS — D689 Coagulation defect, unspecified: Secondary | ICD-10-CM | POA: Diagnosis not present

## 2020-03-18 DIAGNOSIS — Z992 Dependence on renal dialysis: Secondary | ICD-10-CM | POA: Diagnosis not present

## 2020-03-19 ENCOUNTER — Ambulatory Visit (INDEPENDENT_AMBULATORY_CARE_PROVIDER_SITE_OTHER): Payer: Medicare Other | Admitting: Internal Medicine

## 2020-03-19 ENCOUNTER — Other Ambulatory Visit: Payer: Self-pay

## 2020-03-19 ENCOUNTER — Encounter: Payer: Self-pay | Admitting: Internal Medicine

## 2020-03-19 VITALS — BP 118/70 | HR 96 | Temp 98.0°F | Ht 72.0 in | Wt 162.0 lb

## 2020-03-19 DIAGNOSIS — E559 Vitamin D deficiency, unspecified: Secondary | ICD-10-CM

## 2020-03-19 DIAGNOSIS — E1129 Type 2 diabetes mellitus with other diabetic kidney complication: Secondary | ICD-10-CM

## 2020-03-19 DIAGNOSIS — I10 Essential (primary) hypertension: Secondary | ICD-10-CM | POA: Diagnosis not present

## 2020-03-19 DIAGNOSIS — E782 Mixed hyperlipidemia: Secondary | ICD-10-CM

## 2020-03-19 DIAGNOSIS — E611 Iron deficiency: Secondary | ICD-10-CM | POA: Diagnosis not present

## 2020-03-19 DIAGNOSIS — N183 Chronic kidney disease, stage 3 unspecified: Secondary | ICD-10-CM

## 2020-03-19 DIAGNOSIS — E538 Deficiency of other specified B group vitamins: Secondary | ICD-10-CM

## 2020-03-19 DIAGNOSIS — I700 Atherosclerosis of aorta: Secondary | ICD-10-CM

## 2020-03-19 DIAGNOSIS — Z0000 Encounter for general adult medical examination without abnormal findings: Secondary | ICD-10-CM

## 2020-03-19 DIAGNOSIS — D631 Anemia in chronic kidney disease: Secondary | ICD-10-CM

## 2020-03-19 NOTE — Addendum Note (Signed)
Addended by: Hazle Quant on: 03/19/2020 12:01 PM   Modules accepted: Orders

## 2020-03-19 NOTE — Progress Notes (Signed)
Subjective:    Patient ID: Christopher Lopez, male    DOB: 1961/07/12, 59 y.o.   MRN: [id number]  HPI  Here to f/u; overall doing ok,  Pt denies chest pain, increasing sob or doe, wheezing, orthopnea, PND, increased LE swelling, palpitations, dizziness or syncope.  Pt denies new neurological symptoms such as new headache, or facial or extremity weakness or numbness.  Pt denies polydipsia, polyuria, or low sugar episode.  Pt states overall good compliance with meds, mostly trying to follow appropriate diet, with wt overall stable,  but little exercise however.  Still with HD on t-th-sat.  No new complaints Past Medical History:  Diagnosis Date   Alcohol abuse 09/18/2011   Allergic rhinitis, cause unspecified 09/20/2011   Anemia, unspecified 09/18/2011   Arthritis    Childhood asthma 09/20/2011   CKD (chronic kidney disease) stage 5, GFR less than 15 ml/min (HCC)    Dialysis - T/Th/Sa   Colitis 09/18/2011   Dementia ([place]) 09/18/2011   Diabetes mellitus    Diverticulosis of colon without hemorrhage 02/27/2014   Foot ulcer ([place]) 09/18/2011   High cholesterol    HTN (hypertension) 09/18/2011   Hyperlipidemia 11/08/2011   Hypertension    Impaired glucose tolerance 09/18/2011   Pneumonia 09/18/2011   Pre-ulcerative corn or callous 11/20/2011   Stroke ([place])    left leg deficit   Type II or unspecified type diabetes mellitus without mention of complication, uncontrolled 09/20/2011   Wears glasses    Past Surgical History:  Procedure Laterality Date   AV FISTULA PLACEMENT Left 12/23/2017   Procedure: CREATION OF BASILIAC - CEPHALIC  ARTERIOVENOUS (AV) FISTULA  STAGE ONE;  Surgeon: Waynetta Sandy, MD;  Location: [place];  Service: Vascular;  Laterality: Left;   AV FISTULA PLACEMENT Left 07/26/2018   Procedure: INSERTION OF ARTERIOVENOUS (AV) GORE-TEX GRAFT ARM;  Surgeon: Waynetta Sandy, MD;  Location: [place];  Service: Vascular;  Laterality: Left;   [place] Left 03/01/2018   Procedure: BASILIC VEIN TRANSPOSITION SECOND STAGE;  Surgeon: Waynetta Sandy, MD;  Location: [place];  Service: Vascular;  Laterality: Left;   INSERTION OF DIALYSIS CATHETER Right 12/23/2017   Procedure: INSERTION OF PALINDROME  DIALYSIS CATHETER;  Surgeon: Waynetta Sandy, MD;  Location: [place];  Service: Vascular;  Laterality: Right;   THROMBECTOMY BRACHIAL ARTERY Left 07/26/2018   Procedure: THROMBECTOMY LEFT UPPER EXTREMITY;  Surgeon: Waynetta Sandy, MD;  Location: [place];  Service: Vascular;  Laterality: Left;   [place] Left 07/26/2018   Procedure: STENT LEFT AXILLARY VEIN;  Surgeon: Waynetta Sandy, MD;  Location: [place];  Service: Vascular;  Laterality: Left;    reports that he has quit smoking. His smoking use included cigarettes. He smoked 0.50 packs per day. He has never used smokeless tobacco. He reports previous alcohol use of about 2.0 standard drinks of alcohol per week. He reports that he does not use drugs. family history includes Diabetes in his maternal grandfather, mother, and sister; Heart disease in his mother; Prostate cancer in his paternal grandfather; Transient ischemic attack in his mother. Allergies  Allergen Reactions   Lipitor [Atorvastatin] Other (See Comments)   Current Outpatient Medications on File Prior to Visit  Medication Sig Dispense Refill   acetaminophen (TYLENOL 8 HOUR) 650 MG CR tablet Take by mouth.     aspirin 81 MG EC tablet Take by mouth.     cyanocobalamin 1000 MCG tablet Take by  mouth.     diltiazem (CARDIZEM LA) 360 MG 24 hr tablet Take by mouth.     diphenhydrAMINE (BENADRYL ALLERGY) 25 mg capsule Take by mouth.     ferric citrate (AURYXIA) 1 GM 210 MG(Fe) tablet Take by mouth.     glucose blood (ONE TOUCH ULTRA TEST) test strip Use as instructed once daily E11.9 100 each 12   hydrALAZINE (APRESOLINE) 100 MG tablet Take by mouth.      ketoconazole (NIZORAL) 2 % cream Apply topically.     Lancets (ONETOUCH ULTRASOFT) lancets 1 each by Other route 2 (two) times daily. Use to check blood sugars once a day Dx e11.9 100 each 3   lovastatin (MEVACOR) 40 MG tablet Take 1 tablet (40 mg total) by mouth at bedtime. 90 tablet 2   Methoxy PEG-Epoetin Beta (MIRCERA IJ) Mircera     metoprolol succinate (TOPROL-XL) 50 MG 24 hr tablet Take 1 tablet (50 mg total) by mouth daily. 90 tablet 2   Multiple Vitamin (MULTIVITAMIN ADULT PO) Take by mouth.     oxyCODONE-acetaminophen (PERCOCET) 5-325 MG tablet Take 1 tablet by mouth every 6 (six) hours as needed for severe pain. 20 tablet 0   pioglitazone (ACTOS) 15 MG tablet Take 1 tablet by mouth once daily 90 tablet 0   rosuvastatin (CRESTOR) 20 MG tablet Take 1 tablet (20 mg total) by mouth daily. 90 tablet 3   sitaGLIPtin (JANUVIA) 100 MG tablet Take by mouth.     TRULICITY 1.5 ZH/2.9JM SOPN INJECT 0.5ML INTO SKIN ONCE A WEEK 8 mL 2   UNABLE TO FIND Heparin Sodium (Porcine) 1,000 Units/mL Systemic     VITAMIN D PO Take by mouth.     No current facility-administered medications on file prior to visit.   Review of Systems All otherwise neg per pt    Objective:   Physical Exam BP 118/70 (BP Location: Left Arm, Patient Position: Sitting, Cuff Size: Large)    Pulse 96    Temp 98 F (36.7 C) (Oral)    Ht 6' (1.829 m)    Wt 162 lb (73.5 kg)    SpO2 98%    BMI 21.97 kg/m  VS noted,  Constitutional: Pt appears in NAD HENT: Head: NCAT.  Right Ear: External ear normal.  Left Ear: External ear normal.  Eyes: . Pupils are equal, round, and reactive to light. Conjunctivae and EOM are normal Nose: without d/c or deformity Neck: Neck supple. Gross normal ROM Cardiovascular: Normal rate and regular rhythm.   Pulmonary/Chest: Effort normal and breath sounds without rales or wheezing.  Abd:  Soft, NT, ND, + BS, no organomegaly Neurological: Pt is alert. At baseline orientation, motor  grossly intact Skin: Skin is warm. No rashes, other new lesions, no LE edema Psychiatric: Pt behavior is normal without agitation  All otherwise neg per pt Lab Results  Component Value Date   WBC 9.4 09/06/2018   HGB 13.2 09/06/2018   HCT 39.8 09/06/2018   PLT 207.0 09/06/2018   GLUCOSE 100 (H) 03/19/2020   CHOL 194 09/06/2018   TRIG 120.0 09/06/2018   HDL 45.30 09/06/2018   LDLDIRECT 131.0 08/11/2017   LDLCALC 125 (H) 09/06/2018   ALT 13 03/19/2020   AST 16 03/19/2020   NA 136 03/19/2020   K 5.7 (H) 03/19/2020   CL 91 (L) 03/19/2020   CREATININE 8.83 (H) 03/19/2020   BUN 36 (H) 03/19/2020   CO2 29 03/19/2020   TSH 1.32 09/06/2018   PSA 1.2 03/19/2020  HGBA1C 6.4 (H) 03/19/2020   MICROALBUR 98.3 (H) 08/11/2017      Assessment & Plan:

## 2020-03-19 NOTE — Addendum Note (Signed)
Addended by: Hazle Quant on: 03/19/2020 11:58 AM   Modules accepted: Orders

## 2020-03-19 NOTE — Patient Instructions (Signed)

## 2020-03-19 NOTE — Addendum Note (Signed)
Addended by: Hazle Quant on: 03/19/2020 11:59 AM   Modules accepted: Orders

## 2020-03-20 ENCOUNTER — Encounter: Payer: Self-pay | Admitting: Internal Medicine

## 2020-03-20 DIAGNOSIS — D631 Anemia in chronic kidney disease: Secondary | ICD-10-CM | POA: Diagnosis not present

## 2020-03-20 DIAGNOSIS — D689 Coagulation defect, unspecified: Secondary | ICD-10-CM | POA: Diagnosis not present

## 2020-03-20 DIAGNOSIS — N2581 Secondary hyperparathyroidism of renal origin: Secondary | ICD-10-CM | POA: Diagnosis not present

## 2020-03-20 DIAGNOSIS — N186 End stage renal disease: Secondary | ICD-10-CM | POA: Diagnosis not present

## 2020-03-20 DIAGNOSIS — Z992 Dependence on renal dialysis: Secondary | ICD-10-CM | POA: Diagnosis not present

## 2020-03-20 LAB — HEPATIC FUNCTION PANEL
AG Ratio: 1 (calc) (ref 1.0–2.5)
ALT: 13 U/L (ref 9–46)
AST: 16 U/L (ref 10–35)
Albumin: 4.6 g/dL (ref 3.6–5.1)
Alkaline phosphatase (APISO): 71 U/L (ref 35–144)
Bilirubin, Direct: 0.1 mg/dL (ref 0.0–0.2)
Globulin: 4.6 g/dL (calc) — ABNORMAL HIGH (ref 1.9–3.7)
Indirect Bilirubin: 0.5 mg/dL (calc) (ref 0.2–1.2)
Total Bilirubin: 0.6 mg/dL (ref 0.2–1.2)
Total Protein: 9.2 g/dL — ABNORMAL HIGH (ref 6.1–8.1)

## 2020-03-20 LAB — BASIC METABOLIC PANEL
BUN/Creatinine Ratio: 4 (calc) — ABNORMAL LOW (ref 6–22)
BUN: 36 mg/dL — ABNORMAL HIGH (ref 7–25)
CO2: 29 mmol/L (ref 20–32)
Calcium: 9.5 mg/dL (ref 8.6–10.3)
Chloride: 91 mmol/L — ABNORMAL LOW (ref 98–110)
Creat: 8.83 mg/dL — ABNORMAL HIGH (ref 0.70–1.25)
Glucose, Bld: 100 mg/dL — ABNORMAL HIGH (ref 65–99)
Potassium: 5.7 mmol/L — ABNORMAL HIGH (ref 3.5–5.3)
Sodium: 136 mmol/L (ref 135–146)

## 2020-03-20 LAB — FERRITIN: Ferritin: 1498 ng/mL — ABNORMAL HIGH (ref 24–380)

## 2020-03-20 LAB — VITAMIN B12: Vitamin B-12: 640 pg/mL (ref 200–1100)

## 2020-03-20 LAB — HEMOGLOBIN A1C
Hgb A1c MFr Bld: 6.4 % of total Hgb — ABNORMAL HIGH (ref <5.7)
Mean Plasma Glucose: 137 (calc)
eAG (mmol/L): 7.6 (calc)

## 2020-03-20 LAB — VITAMIN D 25 HYDROXY (VIT D DEFICIENCY, FRACTURES): Vit D, 25-Hydroxy: 41 ng/mL (ref 30–100)

## 2020-03-20 LAB — PSA: PSA: 1.2 ng/mL (ref <=4.0)

## 2020-03-20 LAB — TRANSFERRIN: Transferrin: 187 mg/dL — ABNORMAL LOW (ref 188–341)

## 2020-03-22 ENCOUNTER — Encounter: Payer: Self-pay | Admitting: Internal Medicine

## 2020-03-22 DIAGNOSIS — N186 End stage renal disease: Secondary | ICD-10-CM | POA: Diagnosis not present

## 2020-03-22 DIAGNOSIS — D689 Coagulation defect, unspecified: Secondary | ICD-10-CM | POA: Diagnosis not present

## 2020-03-22 DIAGNOSIS — D631 Anemia in chronic kidney disease: Secondary | ICD-10-CM | POA: Diagnosis not present

## 2020-03-22 DIAGNOSIS — N2581 Secondary hyperparathyroidism of renal origin: Secondary | ICD-10-CM | POA: Diagnosis not present

## 2020-03-22 DIAGNOSIS — Z992 Dependence on renal dialysis: Secondary | ICD-10-CM | POA: Diagnosis not present

## 2020-03-22 NOTE — Assessment & Plan Note (Signed)
stable overall by history and exam, recent data reviewed with pt, and pt to continue medical treatment as before,  to f/u any worsening symptoms or concerns  

## 2020-03-22 NOTE — Assessment & Plan Note (Addendum)

## 2020-03-25 DIAGNOSIS — D631 Anemia in chronic kidney disease: Secondary | ICD-10-CM | POA: Diagnosis not present

## 2020-03-25 DIAGNOSIS — D689 Coagulation defect, unspecified: Secondary | ICD-10-CM | POA: Diagnosis not present

## 2020-03-25 DIAGNOSIS — Z992 Dependence on renal dialysis: Secondary | ICD-10-CM | POA: Diagnosis not present

## 2020-03-25 DIAGNOSIS — N186 End stage renal disease: Secondary | ICD-10-CM | POA: Diagnosis not present

## 2020-03-25 DIAGNOSIS — N2581 Secondary hyperparathyroidism of renal origin: Secondary | ICD-10-CM | POA: Diagnosis not present

## 2020-03-27 DIAGNOSIS — Z992 Dependence on renal dialysis: Secondary | ICD-10-CM | POA: Diagnosis not present

## 2020-03-27 DIAGNOSIS — N2581 Secondary hyperparathyroidism of renal origin: Secondary | ICD-10-CM | POA: Diagnosis not present

## 2020-03-27 DIAGNOSIS — N186 End stage renal disease: Secondary | ICD-10-CM | POA: Diagnosis not present

## 2020-03-27 DIAGNOSIS — D631 Anemia in chronic kidney disease: Secondary | ICD-10-CM | POA: Diagnosis not present

## 2020-03-27 DIAGNOSIS — D689 Coagulation defect, unspecified: Secondary | ICD-10-CM | POA: Diagnosis not present

## 2020-03-29 DIAGNOSIS — N186 End stage renal disease: Secondary | ICD-10-CM | POA: Diagnosis not present

## 2020-03-29 DIAGNOSIS — D689 Coagulation defect, unspecified: Secondary | ICD-10-CM | POA: Diagnosis not present

## 2020-03-29 DIAGNOSIS — Z992 Dependence on renal dialysis: Secondary | ICD-10-CM | POA: Diagnosis not present

## 2020-03-29 DIAGNOSIS — N2581 Secondary hyperparathyroidism of renal origin: Secondary | ICD-10-CM | POA: Diagnosis not present

## 2020-03-29 DIAGNOSIS — D631 Anemia in chronic kidney disease: Secondary | ICD-10-CM | POA: Diagnosis not present

## 2020-03-30 DIAGNOSIS — I700 Atherosclerosis of aorta: Secondary | ICD-10-CM | POA: Insufficient documentation

## 2020-03-30 NOTE — Assessment & Plan Note (Signed)
This diagnosis is noted, pt declines statin beyond current treatment, and will continue lower cholesterol diet and there cardiac risk modifying measures

## 2020-04-01 DIAGNOSIS — Z992 Dependence on renal dialysis: Secondary | ICD-10-CM | POA: Diagnosis not present

## 2020-04-01 DIAGNOSIS — D689 Coagulation defect, unspecified: Secondary | ICD-10-CM | POA: Diagnosis not present

## 2020-04-01 DIAGNOSIS — N186 End stage renal disease: Secondary | ICD-10-CM | POA: Diagnosis not present

## 2020-04-01 DIAGNOSIS — D631 Anemia in chronic kidney disease: Secondary | ICD-10-CM | POA: Diagnosis not present

## 2020-04-01 DIAGNOSIS — N2581 Secondary hyperparathyroidism of renal origin: Secondary | ICD-10-CM | POA: Diagnosis not present

## 2020-04-03 DIAGNOSIS — N2581 Secondary hyperparathyroidism of renal origin: Secondary | ICD-10-CM | POA: Diagnosis not present

## 2020-04-03 DIAGNOSIS — Z992 Dependence on renal dialysis: Secondary | ICD-10-CM | POA: Diagnosis not present

## 2020-04-03 DIAGNOSIS — D631 Anemia in chronic kidney disease: Secondary | ICD-10-CM | POA: Diagnosis not present

## 2020-04-03 DIAGNOSIS — N186 End stage renal disease: Secondary | ICD-10-CM | POA: Diagnosis not present

## 2020-04-03 DIAGNOSIS — D689 Coagulation defect, unspecified: Secondary | ICD-10-CM | POA: Diagnosis not present

## 2020-04-05 DIAGNOSIS — N2581 Secondary hyperparathyroidism of renal origin: Secondary | ICD-10-CM | POA: Diagnosis not present

## 2020-04-05 DIAGNOSIS — D631 Anemia in chronic kidney disease: Secondary | ICD-10-CM | POA: Diagnosis not present

## 2020-04-05 DIAGNOSIS — Z992 Dependence on renal dialysis: Secondary | ICD-10-CM | POA: Diagnosis not present

## 2020-04-05 DIAGNOSIS — N186 End stage renal disease: Secondary | ICD-10-CM | POA: Diagnosis not present

## 2020-04-05 DIAGNOSIS — D689 Coagulation defect, unspecified: Secondary | ICD-10-CM | POA: Diagnosis not present

## 2020-04-08 DIAGNOSIS — N186 End stage renal disease: Secondary | ICD-10-CM | POA: Diagnosis not present

## 2020-04-08 DIAGNOSIS — Z992 Dependence on renal dialysis: Secondary | ICD-10-CM | POA: Diagnosis not present

## 2020-04-08 DIAGNOSIS — N2581 Secondary hyperparathyroidism of renal origin: Secondary | ICD-10-CM | POA: Diagnosis not present

## 2020-04-08 DIAGNOSIS — D631 Anemia in chronic kidney disease: Secondary | ICD-10-CM | POA: Diagnosis not present

## 2020-04-08 DIAGNOSIS — D689 Coagulation defect, unspecified: Secondary | ICD-10-CM | POA: Diagnosis not present

## 2020-04-09 ENCOUNTER — Ambulatory Visit (INDEPENDENT_AMBULATORY_CARE_PROVIDER_SITE_OTHER): Payer: Medicare Other | Admitting: Podiatry

## 2020-04-09 ENCOUNTER — Encounter: Payer: Self-pay | Admitting: Podiatry

## 2020-04-09 ENCOUNTER — Other Ambulatory Visit: Payer: Self-pay

## 2020-04-09 DIAGNOSIS — M79676 Pain in unspecified toe(s): Secondary | ICD-10-CM | POA: Diagnosis not present

## 2020-04-09 DIAGNOSIS — E1142 Type 2 diabetes mellitus with diabetic polyneuropathy: Secondary | ICD-10-CM

## 2020-04-09 DIAGNOSIS — B351 Tinea unguium: Secondary | ICD-10-CM

## 2020-04-09 DIAGNOSIS — Q828 Other specified congenital malformations of skin: Secondary | ICD-10-CM | POA: Diagnosis not present

## 2020-04-09 NOTE — Progress Notes (Signed)
This patient returns to my office for at risk foot care.  This patient requires this care by a professional since this patient will be at risk due to having  ESRD, CKD and diabetes.  This patient is unable to cut nails himself since the patient cannot reach his nails.These nails are painful walking and wearing shoes.  This patient presents for at risk foot care today.  General Appearance  Alert, conversant and in no acute stress.  Vascular  Dorsalis pedis and posterior tibial  pulses are palpable  bilaterally.  Capillary return is within normal limits  bilaterally. Temperature is within normal limits  bilaterally.  Neurologic  Senn-Weinstein monofilament wire test diminished   bilaterally. Muscle power within normal limits bilaterally.  Nails Thick disfigured discolored nails with subungual debris  from hallux to fifth toes bilaterally. No evidence of bacterial infection or drainage bilaterally.  Orthopedic  No limitations of motion  feet .  No crepitus or effusions noted.  No bony pathology or digital deformities noted.  Skin  normotropic skin  noted bilaterally.  No signs of infections or ulcers noted.   Porokeratosis sub 4  B/L asymptomatic.  Onychomycosis  Pain in right toes  Pain in left toes  Consent was obtained for treatment procedures.   Mechanical debridement of nails 1-5  bilaterally performed with a nail nipper.  Filed with dremel without incident.  Debride porokeratosis with # 15 blade.   Return office visit   10 weeks                  Told patient to return for periodic foot care and evaluation due to potential at risk complications.   Gardiner Barefoot DPM

## 2020-04-10 DIAGNOSIS — E1122 Type 2 diabetes mellitus with diabetic chronic kidney disease: Secondary | ICD-10-CM | POA: Diagnosis not present

## 2020-04-10 DIAGNOSIS — N2581 Secondary hyperparathyroidism of renal origin: Secondary | ICD-10-CM | POA: Diagnosis not present

## 2020-04-10 DIAGNOSIS — D631 Anemia in chronic kidney disease: Secondary | ICD-10-CM | POA: Diagnosis not present

## 2020-04-10 DIAGNOSIS — N186 End stage renal disease: Secondary | ICD-10-CM | POA: Diagnosis not present

## 2020-04-10 DIAGNOSIS — D689 Coagulation defect, unspecified: Secondary | ICD-10-CM | POA: Diagnosis not present

## 2020-04-10 DIAGNOSIS — Z992 Dependence on renal dialysis: Secondary | ICD-10-CM | POA: Diagnosis not present

## 2020-04-12 DIAGNOSIS — D509 Iron deficiency anemia, unspecified: Secondary | ICD-10-CM | POA: Diagnosis not present

## 2020-04-12 DIAGNOSIS — N2581 Secondary hyperparathyroidism of renal origin: Secondary | ICD-10-CM | POA: Diagnosis not present

## 2020-04-12 DIAGNOSIS — Z23 Encounter for immunization: Secondary | ICD-10-CM | POA: Diagnosis not present

## 2020-04-12 DIAGNOSIS — E1129 Type 2 diabetes mellitus with other diabetic kidney complication: Secondary | ICD-10-CM | POA: Diagnosis not present

## 2020-04-12 DIAGNOSIS — D689 Coagulation defect, unspecified: Secondary | ICD-10-CM | POA: Diagnosis not present

## 2020-04-12 DIAGNOSIS — N186 End stage renal disease: Secondary | ICD-10-CM | POA: Diagnosis not present

## 2020-04-12 DIAGNOSIS — Z992 Dependence on renal dialysis: Secondary | ICD-10-CM | POA: Diagnosis not present

## 2020-04-15 DIAGNOSIS — D689 Coagulation defect, unspecified: Secondary | ICD-10-CM | POA: Diagnosis not present

## 2020-04-15 DIAGNOSIS — Z992 Dependence on renal dialysis: Secondary | ICD-10-CM | POA: Diagnosis not present

## 2020-04-15 DIAGNOSIS — N2581 Secondary hyperparathyroidism of renal origin: Secondary | ICD-10-CM | POA: Diagnosis not present

## 2020-04-15 DIAGNOSIS — E1129 Type 2 diabetes mellitus with other diabetic kidney complication: Secondary | ICD-10-CM | POA: Diagnosis not present

## 2020-04-15 DIAGNOSIS — N186 End stage renal disease: Secondary | ICD-10-CM | POA: Diagnosis not present

## 2020-04-15 DIAGNOSIS — D509 Iron deficiency anemia, unspecified: Secondary | ICD-10-CM | POA: Diagnosis not present

## 2020-04-15 DIAGNOSIS — Z23 Encounter for immunization: Secondary | ICD-10-CM | POA: Diagnosis not present

## 2020-04-17 DIAGNOSIS — Z992 Dependence on renal dialysis: Secondary | ICD-10-CM | POA: Diagnosis not present

## 2020-04-17 DIAGNOSIS — Z23 Encounter for immunization: Secondary | ICD-10-CM | POA: Diagnosis not present

## 2020-04-17 DIAGNOSIS — N186 End stage renal disease: Secondary | ICD-10-CM | POA: Diagnosis not present

## 2020-04-17 DIAGNOSIS — E1129 Type 2 diabetes mellitus with other diabetic kidney complication: Secondary | ICD-10-CM | POA: Diagnosis not present

## 2020-04-17 DIAGNOSIS — N2581 Secondary hyperparathyroidism of renal origin: Secondary | ICD-10-CM | POA: Diagnosis not present

## 2020-04-17 DIAGNOSIS — D509 Iron deficiency anemia, unspecified: Secondary | ICD-10-CM | POA: Diagnosis not present

## 2020-04-17 DIAGNOSIS — D689 Coagulation defect, unspecified: Secondary | ICD-10-CM | POA: Diagnosis not present

## 2020-04-19 DIAGNOSIS — N186 End stage renal disease: Secondary | ICD-10-CM | POA: Diagnosis not present

## 2020-04-19 DIAGNOSIS — D689 Coagulation defect, unspecified: Secondary | ICD-10-CM | POA: Diagnosis not present

## 2020-04-19 DIAGNOSIS — D509 Iron deficiency anemia, unspecified: Secondary | ICD-10-CM | POA: Diagnosis not present

## 2020-04-19 DIAGNOSIS — Z992 Dependence on renal dialysis: Secondary | ICD-10-CM | POA: Diagnosis not present

## 2020-04-19 DIAGNOSIS — N2581 Secondary hyperparathyroidism of renal origin: Secondary | ICD-10-CM | POA: Diagnosis not present

## 2020-04-19 DIAGNOSIS — Z23 Encounter for immunization: Secondary | ICD-10-CM | POA: Diagnosis not present

## 2020-04-19 DIAGNOSIS — E1129 Type 2 diabetes mellitus with other diabetic kidney complication: Secondary | ICD-10-CM | POA: Diagnosis not present

## 2020-04-22 DIAGNOSIS — E1129 Type 2 diabetes mellitus with other diabetic kidney complication: Secondary | ICD-10-CM | POA: Diagnosis not present

## 2020-04-22 DIAGNOSIS — D509 Iron deficiency anemia, unspecified: Secondary | ICD-10-CM | POA: Diagnosis not present

## 2020-04-22 DIAGNOSIS — Z992 Dependence on renal dialysis: Secondary | ICD-10-CM | POA: Diagnosis not present

## 2020-04-22 DIAGNOSIS — N2581 Secondary hyperparathyroidism of renal origin: Secondary | ICD-10-CM | POA: Diagnosis not present

## 2020-04-22 DIAGNOSIS — D689 Coagulation defect, unspecified: Secondary | ICD-10-CM | POA: Diagnosis not present

## 2020-04-22 DIAGNOSIS — N186 End stage renal disease: Secondary | ICD-10-CM | POA: Diagnosis not present

## 2020-04-22 DIAGNOSIS — Z23 Encounter for immunization: Secondary | ICD-10-CM | POA: Diagnosis not present

## 2020-04-24 DIAGNOSIS — N186 End stage renal disease: Secondary | ICD-10-CM | POA: Diagnosis not present

## 2020-04-24 DIAGNOSIS — Z992 Dependence on renal dialysis: Secondary | ICD-10-CM | POA: Diagnosis not present

## 2020-04-24 DIAGNOSIS — D509 Iron deficiency anemia, unspecified: Secondary | ICD-10-CM | POA: Diagnosis not present

## 2020-04-24 DIAGNOSIS — Z23 Encounter for immunization: Secondary | ICD-10-CM | POA: Diagnosis not present

## 2020-04-24 DIAGNOSIS — N2581 Secondary hyperparathyroidism of renal origin: Secondary | ICD-10-CM | POA: Diagnosis not present

## 2020-04-24 DIAGNOSIS — E1129 Type 2 diabetes mellitus with other diabetic kidney complication: Secondary | ICD-10-CM | POA: Diagnosis not present

## 2020-04-24 DIAGNOSIS — D689 Coagulation defect, unspecified: Secondary | ICD-10-CM | POA: Diagnosis not present

## 2020-04-26 DIAGNOSIS — N2581 Secondary hyperparathyroidism of renal origin: Secondary | ICD-10-CM | POA: Diagnosis not present

## 2020-04-26 DIAGNOSIS — Z992 Dependence on renal dialysis: Secondary | ICD-10-CM | POA: Diagnosis not present

## 2020-04-26 DIAGNOSIS — D509 Iron deficiency anemia, unspecified: Secondary | ICD-10-CM | POA: Diagnosis not present

## 2020-04-26 DIAGNOSIS — N186 End stage renal disease: Secondary | ICD-10-CM | POA: Diagnosis not present

## 2020-04-26 DIAGNOSIS — Z23 Encounter for immunization: Secondary | ICD-10-CM | POA: Diagnosis not present

## 2020-04-26 DIAGNOSIS — E1129 Type 2 diabetes mellitus with other diabetic kidney complication: Secondary | ICD-10-CM | POA: Diagnosis not present

## 2020-04-26 DIAGNOSIS — D689 Coagulation defect, unspecified: Secondary | ICD-10-CM | POA: Diagnosis not present

## 2020-04-29 DIAGNOSIS — D509 Iron deficiency anemia, unspecified: Secondary | ICD-10-CM | POA: Diagnosis not present

## 2020-04-29 DIAGNOSIS — N2581 Secondary hyperparathyroidism of renal origin: Secondary | ICD-10-CM | POA: Diagnosis not present

## 2020-04-29 DIAGNOSIS — N186 End stage renal disease: Secondary | ICD-10-CM | POA: Diagnosis not present

## 2020-04-29 DIAGNOSIS — D689 Coagulation defect, unspecified: Secondary | ICD-10-CM | POA: Diagnosis not present

## 2020-04-29 DIAGNOSIS — Z992 Dependence on renal dialysis: Secondary | ICD-10-CM | POA: Diagnosis not present

## 2020-04-29 DIAGNOSIS — Z23 Encounter for immunization: Secondary | ICD-10-CM | POA: Diagnosis not present

## 2020-04-29 DIAGNOSIS — E1129 Type 2 diabetes mellitus with other diabetic kidney complication: Secondary | ICD-10-CM | POA: Diagnosis not present

## 2020-05-01 DIAGNOSIS — N186 End stage renal disease: Secondary | ICD-10-CM | POA: Diagnosis not present

## 2020-05-01 DIAGNOSIS — E1129 Type 2 diabetes mellitus with other diabetic kidney complication: Secondary | ICD-10-CM | POA: Diagnosis not present

## 2020-05-01 DIAGNOSIS — D689 Coagulation defect, unspecified: Secondary | ICD-10-CM | POA: Diagnosis not present

## 2020-05-01 DIAGNOSIS — D509 Iron deficiency anemia, unspecified: Secondary | ICD-10-CM | POA: Diagnosis not present

## 2020-05-01 DIAGNOSIS — Z992 Dependence on renal dialysis: Secondary | ICD-10-CM | POA: Diagnosis not present

## 2020-05-01 DIAGNOSIS — N2581 Secondary hyperparathyroidism of renal origin: Secondary | ICD-10-CM | POA: Diagnosis not present

## 2020-05-01 DIAGNOSIS — Z23 Encounter for immunization: Secondary | ICD-10-CM | POA: Diagnosis not present

## 2020-05-03 DIAGNOSIS — D689 Coagulation defect, unspecified: Secondary | ICD-10-CM | POA: Diagnosis not present

## 2020-05-03 DIAGNOSIS — N186 End stage renal disease: Secondary | ICD-10-CM | POA: Diagnosis not present

## 2020-05-03 DIAGNOSIS — D509 Iron deficiency anemia, unspecified: Secondary | ICD-10-CM | POA: Diagnosis not present

## 2020-05-03 DIAGNOSIS — Z23 Encounter for immunization: Secondary | ICD-10-CM | POA: Diagnosis not present

## 2020-05-03 DIAGNOSIS — E1129 Type 2 diabetes mellitus with other diabetic kidney complication: Secondary | ICD-10-CM | POA: Diagnosis not present

## 2020-05-03 DIAGNOSIS — Z992 Dependence on renal dialysis: Secondary | ICD-10-CM | POA: Diagnosis not present

## 2020-05-03 DIAGNOSIS — N2581 Secondary hyperparathyroidism of renal origin: Secondary | ICD-10-CM | POA: Diagnosis not present

## 2020-05-06 DIAGNOSIS — D509 Iron deficiency anemia, unspecified: Secondary | ICD-10-CM | POA: Diagnosis not present

## 2020-05-06 DIAGNOSIS — E1129 Type 2 diabetes mellitus with other diabetic kidney complication: Secondary | ICD-10-CM | POA: Diagnosis not present

## 2020-05-06 DIAGNOSIS — N186 End stage renal disease: Secondary | ICD-10-CM | POA: Diagnosis not present

## 2020-05-06 DIAGNOSIS — Z23 Encounter for immunization: Secondary | ICD-10-CM | POA: Diagnosis not present

## 2020-05-06 DIAGNOSIS — D689 Coagulation defect, unspecified: Secondary | ICD-10-CM | POA: Diagnosis not present

## 2020-05-06 DIAGNOSIS — Z992 Dependence on renal dialysis: Secondary | ICD-10-CM | POA: Diagnosis not present

## 2020-05-06 DIAGNOSIS — N2581 Secondary hyperparathyroidism of renal origin: Secondary | ICD-10-CM | POA: Diagnosis not present

## 2020-05-08 DIAGNOSIS — N186 End stage renal disease: Secondary | ICD-10-CM | POA: Diagnosis not present

## 2020-05-08 DIAGNOSIS — D509 Iron deficiency anemia, unspecified: Secondary | ICD-10-CM | POA: Diagnosis not present

## 2020-05-08 DIAGNOSIS — D689 Coagulation defect, unspecified: Secondary | ICD-10-CM | POA: Diagnosis not present

## 2020-05-08 DIAGNOSIS — N2581 Secondary hyperparathyroidism of renal origin: Secondary | ICD-10-CM | POA: Diagnosis not present

## 2020-05-08 DIAGNOSIS — E1129 Type 2 diabetes mellitus with other diabetic kidney complication: Secondary | ICD-10-CM | POA: Diagnosis not present

## 2020-05-08 DIAGNOSIS — Z992 Dependence on renal dialysis: Secondary | ICD-10-CM | POA: Diagnosis not present

## 2020-05-08 DIAGNOSIS — Z23 Encounter for immunization: Secondary | ICD-10-CM | POA: Diagnosis not present

## 2020-05-10 DIAGNOSIS — D689 Coagulation defect, unspecified: Secondary | ICD-10-CM | POA: Diagnosis not present

## 2020-05-10 DIAGNOSIS — Z992 Dependence on renal dialysis: Secondary | ICD-10-CM | POA: Diagnosis not present

## 2020-05-10 DIAGNOSIS — Z23 Encounter for immunization: Secondary | ICD-10-CM | POA: Diagnosis not present

## 2020-05-10 DIAGNOSIS — D509 Iron deficiency anemia, unspecified: Secondary | ICD-10-CM | POA: Diagnosis not present

## 2020-05-10 DIAGNOSIS — N2581 Secondary hyperparathyroidism of renal origin: Secondary | ICD-10-CM | POA: Diagnosis not present

## 2020-05-10 DIAGNOSIS — E1129 Type 2 diabetes mellitus with other diabetic kidney complication: Secondary | ICD-10-CM | POA: Diagnosis not present

## 2020-05-10 DIAGNOSIS — N186 End stage renal disease: Secondary | ICD-10-CM | POA: Diagnosis not present

## 2020-05-11 DIAGNOSIS — N186 End stage renal disease: Secondary | ICD-10-CM | POA: Diagnosis not present

## 2020-05-11 DIAGNOSIS — Z992 Dependence on renal dialysis: Secondary | ICD-10-CM | POA: Diagnosis not present

## 2020-05-11 DIAGNOSIS — E1122 Type 2 diabetes mellitus with diabetic chronic kidney disease: Secondary | ICD-10-CM | POA: Diagnosis not present

## 2020-05-12 ENCOUNTER — Other Ambulatory Visit: Payer: Self-pay | Admitting: Internal Medicine

## 2020-05-12 NOTE — Telephone Encounter (Signed)
please refill as per office routine med refill policy (all routine meds refilled for 3 mo or monthly per pt preference up to one year from last visit, then month to month grace period for 3 mo, then further med refills will have to be denied)  

## 2020-05-13 DIAGNOSIS — N2581 Secondary hyperparathyroidism of renal origin: Secondary | ICD-10-CM | POA: Diagnosis not present

## 2020-05-13 DIAGNOSIS — N186 End stage renal disease: Secondary | ICD-10-CM | POA: Diagnosis not present

## 2020-05-13 DIAGNOSIS — D689 Coagulation defect, unspecified: Secondary | ICD-10-CM | POA: Diagnosis not present

## 2020-05-13 DIAGNOSIS — Z992 Dependence on renal dialysis: Secondary | ICD-10-CM | POA: Diagnosis not present

## 2020-05-13 DIAGNOSIS — D509 Iron deficiency anemia, unspecified: Secondary | ICD-10-CM | POA: Diagnosis not present

## 2020-05-15 DIAGNOSIS — D689 Coagulation defect, unspecified: Secondary | ICD-10-CM | POA: Diagnosis not present

## 2020-05-15 DIAGNOSIS — N186 End stage renal disease: Secondary | ICD-10-CM | POA: Diagnosis not present

## 2020-05-15 DIAGNOSIS — Z992 Dependence on renal dialysis: Secondary | ICD-10-CM | POA: Diagnosis not present

## 2020-05-15 DIAGNOSIS — N2581 Secondary hyperparathyroidism of renal origin: Secondary | ICD-10-CM | POA: Diagnosis not present

## 2020-05-15 DIAGNOSIS — D509 Iron deficiency anemia, unspecified: Secondary | ICD-10-CM | POA: Diagnosis not present

## 2020-05-17 DIAGNOSIS — D689 Coagulation defect, unspecified: Secondary | ICD-10-CM | POA: Diagnosis not present

## 2020-05-17 DIAGNOSIS — N186 End stage renal disease: Secondary | ICD-10-CM | POA: Diagnosis not present

## 2020-05-17 DIAGNOSIS — Z992 Dependence on renal dialysis: Secondary | ICD-10-CM | POA: Diagnosis not present

## 2020-05-17 DIAGNOSIS — D509 Iron deficiency anemia, unspecified: Secondary | ICD-10-CM | POA: Diagnosis not present

## 2020-05-17 DIAGNOSIS — N2581 Secondary hyperparathyroidism of renal origin: Secondary | ICD-10-CM | POA: Diagnosis not present

## 2020-05-20 DIAGNOSIS — Z992 Dependence on renal dialysis: Secondary | ICD-10-CM | POA: Diagnosis not present

## 2020-05-20 DIAGNOSIS — N2581 Secondary hyperparathyroidism of renal origin: Secondary | ICD-10-CM | POA: Diagnosis not present

## 2020-05-20 DIAGNOSIS — D689 Coagulation defect, unspecified: Secondary | ICD-10-CM | POA: Diagnosis not present

## 2020-05-20 DIAGNOSIS — N186 End stage renal disease: Secondary | ICD-10-CM | POA: Diagnosis not present

## 2020-05-20 DIAGNOSIS — D509 Iron deficiency anemia, unspecified: Secondary | ICD-10-CM | POA: Diagnosis not present

## 2020-05-22 DIAGNOSIS — D689 Coagulation defect, unspecified: Secondary | ICD-10-CM | POA: Diagnosis not present

## 2020-05-22 DIAGNOSIS — Z992 Dependence on renal dialysis: Secondary | ICD-10-CM | POA: Diagnosis not present

## 2020-05-22 DIAGNOSIS — N186 End stage renal disease: Secondary | ICD-10-CM | POA: Diagnosis not present

## 2020-05-22 DIAGNOSIS — D509 Iron deficiency anemia, unspecified: Secondary | ICD-10-CM | POA: Diagnosis not present

## 2020-05-22 DIAGNOSIS — N2581 Secondary hyperparathyroidism of renal origin: Secondary | ICD-10-CM | POA: Diagnosis not present

## 2020-05-24 DIAGNOSIS — Z992 Dependence on renal dialysis: Secondary | ICD-10-CM | POA: Diagnosis not present

## 2020-05-24 DIAGNOSIS — N2581 Secondary hyperparathyroidism of renal origin: Secondary | ICD-10-CM | POA: Diagnosis not present

## 2020-05-24 DIAGNOSIS — D689 Coagulation defect, unspecified: Secondary | ICD-10-CM | POA: Diagnosis not present

## 2020-05-24 DIAGNOSIS — D509 Iron deficiency anemia, unspecified: Secondary | ICD-10-CM | POA: Diagnosis not present

## 2020-05-24 DIAGNOSIS — N186 End stage renal disease: Secondary | ICD-10-CM | POA: Diagnosis not present

## 2020-05-27 DIAGNOSIS — Z992 Dependence on renal dialysis: Secondary | ICD-10-CM | POA: Diagnosis not present

## 2020-05-27 DIAGNOSIS — D689 Coagulation defect, unspecified: Secondary | ICD-10-CM | POA: Diagnosis not present

## 2020-05-27 DIAGNOSIS — N2581 Secondary hyperparathyroidism of renal origin: Secondary | ICD-10-CM | POA: Diagnosis not present

## 2020-05-27 DIAGNOSIS — D509 Iron deficiency anemia, unspecified: Secondary | ICD-10-CM | POA: Diagnosis not present

## 2020-05-27 DIAGNOSIS — N186 End stage renal disease: Secondary | ICD-10-CM | POA: Diagnosis not present

## 2020-05-29 DIAGNOSIS — D509 Iron deficiency anemia, unspecified: Secondary | ICD-10-CM | POA: Diagnosis not present

## 2020-05-29 DIAGNOSIS — N186 End stage renal disease: Secondary | ICD-10-CM | POA: Diagnosis not present

## 2020-05-29 DIAGNOSIS — Z992 Dependence on renal dialysis: Secondary | ICD-10-CM | POA: Diagnosis not present

## 2020-05-29 DIAGNOSIS — D689 Coagulation defect, unspecified: Secondary | ICD-10-CM | POA: Diagnosis not present

## 2020-05-29 DIAGNOSIS — N2581 Secondary hyperparathyroidism of renal origin: Secondary | ICD-10-CM | POA: Diagnosis not present

## 2020-05-31 DIAGNOSIS — N2581 Secondary hyperparathyroidism of renal origin: Secondary | ICD-10-CM | POA: Diagnosis not present

## 2020-05-31 DIAGNOSIS — N186 End stage renal disease: Secondary | ICD-10-CM | POA: Diagnosis not present

## 2020-05-31 DIAGNOSIS — Z992 Dependence on renal dialysis: Secondary | ICD-10-CM | POA: Diagnosis not present

## 2020-05-31 DIAGNOSIS — D689 Coagulation defect, unspecified: Secondary | ICD-10-CM | POA: Diagnosis not present

## 2020-05-31 DIAGNOSIS — D509 Iron deficiency anemia, unspecified: Secondary | ICD-10-CM | POA: Diagnosis not present

## 2020-06-02 DIAGNOSIS — D689 Coagulation defect, unspecified: Secondary | ICD-10-CM | POA: Diagnosis not present

## 2020-06-02 DIAGNOSIS — N2581 Secondary hyperparathyroidism of renal origin: Secondary | ICD-10-CM | POA: Diagnosis not present

## 2020-06-02 DIAGNOSIS — D509 Iron deficiency anemia, unspecified: Secondary | ICD-10-CM | POA: Diagnosis not present

## 2020-06-02 DIAGNOSIS — Z992 Dependence on renal dialysis: Secondary | ICD-10-CM | POA: Diagnosis not present

## 2020-06-02 DIAGNOSIS — N186 End stage renal disease: Secondary | ICD-10-CM | POA: Diagnosis not present

## 2020-06-04 DIAGNOSIS — D509 Iron deficiency anemia, unspecified: Secondary | ICD-10-CM | POA: Diagnosis not present

## 2020-06-04 DIAGNOSIS — D689 Coagulation defect, unspecified: Secondary | ICD-10-CM | POA: Diagnosis not present

## 2020-06-04 DIAGNOSIS — N2581 Secondary hyperparathyroidism of renal origin: Secondary | ICD-10-CM | POA: Diagnosis not present

## 2020-06-04 DIAGNOSIS — N186 End stage renal disease: Secondary | ICD-10-CM | POA: Diagnosis not present

## 2020-06-04 DIAGNOSIS — Z992 Dependence on renal dialysis: Secondary | ICD-10-CM | POA: Diagnosis not present

## 2020-06-07 DIAGNOSIS — D689 Coagulation defect, unspecified: Secondary | ICD-10-CM | POA: Diagnosis not present

## 2020-06-07 DIAGNOSIS — Z992 Dependence on renal dialysis: Secondary | ICD-10-CM | POA: Diagnosis not present

## 2020-06-07 DIAGNOSIS — N2581 Secondary hyperparathyroidism of renal origin: Secondary | ICD-10-CM | POA: Diagnosis not present

## 2020-06-07 DIAGNOSIS — D509 Iron deficiency anemia, unspecified: Secondary | ICD-10-CM | POA: Diagnosis not present

## 2020-06-07 DIAGNOSIS — N186 End stage renal disease: Secondary | ICD-10-CM | POA: Diagnosis not present

## 2020-06-10 DIAGNOSIS — N2581 Secondary hyperparathyroidism of renal origin: Secondary | ICD-10-CM | POA: Diagnosis not present

## 2020-06-10 DIAGNOSIS — N186 End stage renal disease: Secondary | ICD-10-CM | POA: Diagnosis not present

## 2020-06-10 DIAGNOSIS — Z992 Dependence on renal dialysis: Secondary | ICD-10-CM | POA: Diagnosis not present

## 2020-06-10 DIAGNOSIS — E1122 Type 2 diabetes mellitus with diabetic chronic kidney disease: Secondary | ICD-10-CM | POA: Diagnosis not present

## 2020-06-10 DIAGNOSIS — D509 Iron deficiency anemia, unspecified: Secondary | ICD-10-CM | POA: Diagnosis not present

## 2020-06-10 DIAGNOSIS — D689 Coagulation defect, unspecified: Secondary | ICD-10-CM | POA: Diagnosis not present

## 2020-06-12 DIAGNOSIS — Z992 Dependence on renal dialysis: Secondary | ICD-10-CM | POA: Diagnosis not present

## 2020-06-12 DIAGNOSIS — D689 Coagulation defect, unspecified: Secondary | ICD-10-CM | POA: Diagnosis not present

## 2020-06-12 DIAGNOSIS — N186 End stage renal disease: Secondary | ICD-10-CM | POA: Diagnosis not present

## 2020-06-12 DIAGNOSIS — N2581 Secondary hyperparathyroidism of renal origin: Secondary | ICD-10-CM | POA: Diagnosis not present

## 2020-06-12 DIAGNOSIS — D509 Iron deficiency anemia, unspecified: Secondary | ICD-10-CM | POA: Diagnosis not present

## 2020-06-14 DIAGNOSIS — Z992 Dependence on renal dialysis: Secondary | ICD-10-CM | POA: Diagnosis not present

## 2020-06-14 DIAGNOSIS — N2581 Secondary hyperparathyroidism of renal origin: Secondary | ICD-10-CM | POA: Diagnosis not present

## 2020-06-14 DIAGNOSIS — N186 End stage renal disease: Secondary | ICD-10-CM | POA: Diagnosis not present

## 2020-06-14 DIAGNOSIS — D509 Iron deficiency anemia, unspecified: Secondary | ICD-10-CM | POA: Diagnosis not present

## 2020-06-14 DIAGNOSIS — D689 Coagulation defect, unspecified: Secondary | ICD-10-CM | POA: Diagnosis not present

## 2020-06-17 DIAGNOSIS — D509 Iron deficiency anemia, unspecified: Secondary | ICD-10-CM | POA: Diagnosis not present

## 2020-06-17 DIAGNOSIS — D689 Coagulation defect, unspecified: Secondary | ICD-10-CM | POA: Diagnosis not present

## 2020-06-17 DIAGNOSIS — N2581 Secondary hyperparathyroidism of renal origin: Secondary | ICD-10-CM | POA: Diagnosis not present

## 2020-06-17 DIAGNOSIS — N186 End stage renal disease: Secondary | ICD-10-CM | POA: Diagnosis not present

## 2020-06-17 DIAGNOSIS — Z992 Dependence on renal dialysis: Secondary | ICD-10-CM | POA: Diagnosis not present

## 2020-06-19 DIAGNOSIS — Z992 Dependence on renal dialysis: Secondary | ICD-10-CM | POA: Diagnosis not present

## 2020-06-19 DIAGNOSIS — N186 End stage renal disease: Secondary | ICD-10-CM | POA: Diagnosis not present

## 2020-06-19 DIAGNOSIS — N2581 Secondary hyperparathyroidism of renal origin: Secondary | ICD-10-CM | POA: Diagnosis not present

## 2020-06-19 DIAGNOSIS — D689 Coagulation defect, unspecified: Secondary | ICD-10-CM | POA: Diagnosis not present

## 2020-06-19 DIAGNOSIS — D509 Iron deficiency anemia, unspecified: Secondary | ICD-10-CM | POA: Diagnosis not present

## 2020-06-21 DIAGNOSIS — D689 Coagulation defect, unspecified: Secondary | ICD-10-CM | POA: Diagnosis not present

## 2020-06-21 DIAGNOSIS — D509 Iron deficiency anemia, unspecified: Secondary | ICD-10-CM | POA: Diagnosis not present

## 2020-06-21 DIAGNOSIS — N2581 Secondary hyperparathyroidism of renal origin: Secondary | ICD-10-CM | POA: Diagnosis not present

## 2020-06-21 DIAGNOSIS — Z992 Dependence on renal dialysis: Secondary | ICD-10-CM | POA: Diagnosis not present

## 2020-06-21 DIAGNOSIS — N186 End stage renal disease: Secondary | ICD-10-CM | POA: Diagnosis not present

## 2020-06-24 DIAGNOSIS — N186 End stage renal disease: Secondary | ICD-10-CM | POA: Diagnosis not present

## 2020-06-24 DIAGNOSIS — D689 Coagulation defect, unspecified: Secondary | ICD-10-CM | POA: Diagnosis not present

## 2020-06-24 DIAGNOSIS — Z992 Dependence on renal dialysis: Secondary | ICD-10-CM | POA: Diagnosis not present

## 2020-06-24 DIAGNOSIS — D509 Iron deficiency anemia, unspecified: Secondary | ICD-10-CM | POA: Diagnosis not present

## 2020-06-24 DIAGNOSIS — N2581 Secondary hyperparathyroidism of renal origin: Secondary | ICD-10-CM | POA: Diagnosis not present

## 2020-06-26 DIAGNOSIS — N2581 Secondary hyperparathyroidism of renal origin: Secondary | ICD-10-CM | POA: Diagnosis not present

## 2020-06-26 DIAGNOSIS — D689 Coagulation defect, unspecified: Secondary | ICD-10-CM | POA: Diagnosis not present

## 2020-06-26 DIAGNOSIS — D509 Iron deficiency anemia, unspecified: Secondary | ICD-10-CM | POA: Diagnosis not present

## 2020-06-26 DIAGNOSIS — Z992 Dependence on renal dialysis: Secondary | ICD-10-CM | POA: Diagnosis not present

## 2020-06-26 DIAGNOSIS — N186 End stage renal disease: Secondary | ICD-10-CM | POA: Diagnosis not present

## 2020-06-28 DIAGNOSIS — N186 End stage renal disease: Secondary | ICD-10-CM | POA: Diagnosis not present

## 2020-06-28 DIAGNOSIS — D509 Iron deficiency anemia, unspecified: Secondary | ICD-10-CM | POA: Diagnosis not present

## 2020-06-28 DIAGNOSIS — Z992 Dependence on renal dialysis: Secondary | ICD-10-CM | POA: Diagnosis not present

## 2020-06-28 DIAGNOSIS — D689 Coagulation defect, unspecified: Secondary | ICD-10-CM | POA: Diagnosis not present

## 2020-06-28 DIAGNOSIS — N2581 Secondary hyperparathyroidism of renal origin: Secondary | ICD-10-CM | POA: Diagnosis not present

## 2020-07-01 DIAGNOSIS — Z992 Dependence on renal dialysis: Secondary | ICD-10-CM | POA: Diagnosis not present

## 2020-07-01 DIAGNOSIS — N186 End stage renal disease: Secondary | ICD-10-CM | POA: Diagnosis not present

## 2020-07-01 DIAGNOSIS — D509 Iron deficiency anemia, unspecified: Secondary | ICD-10-CM | POA: Diagnosis not present

## 2020-07-01 DIAGNOSIS — D689 Coagulation defect, unspecified: Secondary | ICD-10-CM | POA: Diagnosis not present

## 2020-07-01 DIAGNOSIS — N2581 Secondary hyperparathyroidism of renal origin: Secondary | ICD-10-CM | POA: Diagnosis not present

## 2020-07-03 DIAGNOSIS — Z992 Dependence on renal dialysis: Secondary | ICD-10-CM | POA: Diagnosis not present

## 2020-07-03 DIAGNOSIS — D689 Coagulation defect, unspecified: Secondary | ICD-10-CM | POA: Diagnosis not present

## 2020-07-03 DIAGNOSIS — N2581 Secondary hyperparathyroidism of renal origin: Secondary | ICD-10-CM | POA: Diagnosis not present

## 2020-07-03 DIAGNOSIS — N186 End stage renal disease: Secondary | ICD-10-CM | POA: Diagnosis not present

## 2020-07-03 DIAGNOSIS — D509 Iron deficiency anemia, unspecified: Secondary | ICD-10-CM | POA: Diagnosis not present

## 2020-07-06 DIAGNOSIS — N186 End stage renal disease: Secondary | ICD-10-CM | POA: Diagnosis not present

## 2020-07-06 DIAGNOSIS — N2581 Secondary hyperparathyroidism of renal origin: Secondary | ICD-10-CM | POA: Diagnosis not present

## 2020-07-06 DIAGNOSIS — D509 Iron deficiency anemia, unspecified: Secondary | ICD-10-CM | POA: Diagnosis not present

## 2020-07-06 DIAGNOSIS — D689 Coagulation defect, unspecified: Secondary | ICD-10-CM | POA: Diagnosis not present

## 2020-07-06 DIAGNOSIS — Z992 Dependence on renal dialysis: Secondary | ICD-10-CM | POA: Diagnosis not present

## 2020-07-08 DIAGNOSIS — N186 End stage renal disease: Secondary | ICD-10-CM | POA: Diagnosis not present

## 2020-07-08 DIAGNOSIS — Z992 Dependence on renal dialysis: Secondary | ICD-10-CM | POA: Diagnosis not present

## 2020-07-08 DIAGNOSIS — N2581 Secondary hyperparathyroidism of renal origin: Secondary | ICD-10-CM | POA: Diagnosis not present

## 2020-07-08 DIAGNOSIS — D689 Coagulation defect, unspecified: Secondary | ICD-10-CM | POA: Diagnosis not present

## 2020-07-08 DIAGNOSIS — D509 Iron deficiency anemia, unspecified: Secondary | ICD-10-CM | POA: Diagnosis not present

## 2020-07-09 ENCOUNTER — Encounter: Payer: Self-pay | Admitting: Podiatry

## 2020-07-09 ENCOUNTER — Ambulatory Visit (INDEPENDENT_AMBULATORY_CARE_PROVIDER_SITE_OTHER): Payer: Medicare Other | Admitting: Podiatry

## 2020-07-09 ENCOUNTER — Other Ambulatory Visit: Payer: Self-pay

## 2020-07-09 DIAGNOSIS — E1142 Type 2 diabetes mellitus with diabetic polyneuropathy: Secondary | ICD-10-CM | POA: Diagnosis not present

## 2020-07-09 DIAGNOSIS — Q828 Other specified congenital malformations of skin: Secondary | ICD-10-CM | POA: Diagnosis not present

## 2020-07-09 DIAGNOSIS — B351 Tinea unguium: Secondary | ICD-10-CM

## 2020-07-09 DIAGNOSIS — M79676 Pain in unspecified toe(s): Secondary | ICD-10-CM

## 2020-07-09 NOTE — Progress Notes (Signed)
This patient returns to my office for at risk foot care.  This patient requires this care by a professional since this patient will be at risk due to having  ESRD, CKD and diabetes.  This patient is unable to cut nails himself since the patient cannot reach his nails.These nails are painful walking and wearing shoes.  This patient presents for at risk foot care today.  General Appearance  Alert, conversant and in no acute stress.  Vascular  Dorsalis pedis and posterior tibial  pulses are palpable  bilaterally.  Capillary return is within normal limits  bilaterally. Temperature is within normal limits  bilaterally.  Neurologic  Senn-Weinstein monofilament wire test diminished   bilaterally. Muscle power within normal limits bilaterally.  Nails Thick disfigured discolored nails with subungual debris  from hallux to fifth toes bilaterally. No evidence of bacterial infection or drainage bilaterally.  Orthopedic  No limitations of motion  feet .  No crepitus or effusions noted.  No bony pathology or digital deformities noted.  Skin  normotropic skin  noted bilaterally.  No signs of infections or ulcers noted.   Porokeratosis sub 4  B/L asymptomatic.  Onychomycosis  Pain in right toes  Pain in left toes  Consent was obtained for treatment procedures.   Mechanical debridement of nails 1-5  bilaterally performed with a nail nipper.  Filed with dremel without incident.  Debride porokeratosis with # 15 blade.   Return office visit   10 weeks                  Told patient to return for periodic foot care and evaluation due to potential at risk complications.   Gardiner Barefoot DPM

## 2020-07-10 DIAGNOSIS — N2581 Secondary hyperparathyroidism of renal origin: Secondary | ICD-10-CM | POA: Diagnosis not present

## 2020-07-10 DIAGNOSIS — D509 Iron deficiency anemia, unspecified: Secondary | ICD-10-CM | POA: Diagnosis not present

## 2020-07-10 DIAGNOSIS — D689 Coagulation defect, unspecified: Secondary | ICD-10-CM | POA: Diagnosis not present

## 2020-07-10 DIAGNOSIS — Z992 Dependence on renal dialysis: Secondary | ICD-10-CM | POA: Diagnosis not present

## 2020-07-10 DIAGNOSIS — N186 End stage renal disease: Secondary | ICD-10-CM | POA: Diagnosis not present

## 2020-07-11 DIAGNOSIS — Z992 Dependence on renal dialysis: Secondary | ICD-10-CM | POA: Diagnosis not present

## 2020-07-11 DIAGNOSIS — N186 End stage renal disease: Secondary | ICD-10-CM | POA: Diagnosis not present

## 2020-07-11 DIAGNOSIS — E1122 Type 2 diabetes mellitus with diabetic chronic kidney disease: Secondary | ICD-10-CM | POA: Diagnosis not present

## 2020-07-13 DIAGNOSIS — D689 Coagulation defect, unspecified: Secondary | ICD-10-CM | POA: Diagnosis not present

## 2020-07-13 DIAGNOSIS — N2581 Secondary hyperparathyroidism of renal origin: Secondary | ICD-10-CM | POA: Diagnosis not present

## 2020-07-13 DIAGNOSIS — E1129 Type 2 diabetes mellitus with other diabetic kidney complication: Secondary | ICD-10-CM | POA: Diagnosis not present

## 2020-07-13 DIAGNOSIS — D509 Iron deficiency anemia, unspecified: Secondary | ICD-10-CM | POA: Diagnosis not present

## 2020-07-13 DIAGNOSIS — Z992 Dependence on renal dialysis: Secondary | ICD-10-CM | POA: Diagnosis not present

## 2020-07-13 DIAGNOSIS — N186 End stage renal disease: Secondary | ICD-10-CM | POA: Diagnosis not present

## 2020-07-15 DIAGNOSIS — E1129 Type 2 diabetes mellitus with other diabetic kidney complication: Secondary | ICD-10-CM | POA: Diagnosis not present

## 2020-07-15 DIAGNOSIS — Z992 Dependence on renal dialysis: Secondary | ICD-10-CM | POA: Diagnosis not present

## 2020-07-15 DIAGNOSIS — D509 Iron deficiency anemia, unspecified: Secondary | ICD-10-CM | POA: Diagnosis not present

## 2020-07-15 DIAGNOSIS — N2581 Secondary hyperparathyroidism of renal origin: Secondary | ICD-10-CM | POA: Diagnosis not present

## 2020-07-15 DIAGNOSIS — N186 End stage renal disease: Secondary | ICD-10-CM | POA: Diagnosis not present

## 2020-07-15 DIAGNOSIS — D689 Coagulation defect, unspecified: Secondary | ICD-10-CM | POA: Diagnosis not present

## 2020-07-17 DIAGNOSIS — N186 End stage renal disease: Secondary | ICD-10-CM | POA: Diagnosis not present

## 2020-07-17 DIAGNOSIS — D689 Coagulation defect, unspecified: Secondary | ICD-10-CM | POA: Diagnosis not present

## 2020-07-17 DIAGNOSIS — D509 Iron deficiency anemia, unspecified: Secondary | ICD-10-CM | POA: Diagnosis not present

## 2020-07-17 DIAGNOSIS — N2581 Secondary hyperparathyroidism of renal origin: Secondary | ICD-10-CM | POA: Diagnosis not present

## 2020-07-17 DIAGNOSIS — Z992 Dependence on renal dialysis: Secondary | ICD-10-CM | POA: Diagnosis not present

## 2020-07-17 DIAGNOSIS — E1129 Type 2 diabetes mellitus with other diabetic kidney complication: Secondary | ICD-10-CM | POA: Diagnosis not present

## 2020-07-19 DIAGNOSIS — N186 End stage renal disease: Secondary | ICD-10-CM | POA: Diagnosis not present

## 2020-07-19 DIAGNOSIS — E1129 Type 2 diabetes mellitus with other diabetic kidney complication: Secondary | ICD-10-CM | POA: Diagnosis not present

## 2020-07-19 DIAGNOSIS — D689 Coagulation defect, unspecified: Secondary | ICD-10-CM | POA: Diagnosis not present

## 2020-07-19 DIAGNOSIS — Z992 Dependence on renal dialysis: Secondary | ICD-10-CM | POA: Diagnosis not present

## 2020-07-19 DIAGNOSIS — D509 Iron deficiency anemia, unspecified: Secondary | ICD-10-CM | POA: Diagnosis not present

## 2020-07-19 DIAGNOSIS — N2581 Secondary hyperparathyroidism of renal origin: Secondary | ICD-10-CM | POA: Diagnosis not present

## 2020-07-22 DIAGNOSIS — E1129 Type 2 diabetes mellitus with other diabetic kidney complication: Secondary | ICD-10-CM | POA: Diagnosis not present

## 2020-07-22 DIAGNOSIS — N2581 Secondary hyperparathyroidism of renal origin: Secondary | ICD-10-CM | POA: Diagnosis not present

## 2020-07-22 DIAGNOSIS — Z992 Dependence on renal dialysis: Secondary | ICD-10-CM | POA: Diagnosis not present

## 2020-07-22 DIAGNOSIS — D689 Coagulation defect, unspecified: Secondary | ICD-10-CM | POA: Diagnosis not present

## 2020-07-22 DIAGNOSIS — N186 End stage renal disease: Secondary | ICD-10-CM | POA: Diagnosis not present

## 2020-07-22 DIAGNOSIS — D509 Iron deficiency anemia, unspecified: Secondary | ICD-10-CM | POA: Diagnosis not present

## 2020-07-24 DIAGNOSIS — N2581 Secondary hyperparathyroidism of renal origin: Secondary | ICD-10-CM | POA: Diagnosis not present

## 2020-07-24 DIAGNOSIS — D689 Coagulation defect, unspecified: Secondary | ICD-10-CM | POA: Diagnosis not present

## 2020-07-24 DIAGNOSIS — N186 End stage renal disease: Secondary | ICD-10-CM | POA: Diagnosis not present

## 2020-07-24 DIAGNOSIS — Z992 Dependence on renal dialysis: Secondary | ICD-10-CM | POA: Diagnosis not present

## 2020-07-24 DIAGNOSIS — D509 Iron deficiency anemia, unspecified: Secondary | ICD-10-CM | POA: Diagnosis not present

## 2020-07-24 DIAGNOSIS — E1129 Type 2 diabetes mellitus with other diabetic kidney complication: Secondary | ICD-10-CM | POA: Diagnosis not present

## 2020-07-26 DIAGNOSIS — N186 End stage renal disease: Secondary | ICD-10-CM | POA: Diagnosis not present

## 2020-07-26 DIAGNOSIS — N2581 Secondary hyperparathyroidism of renal origin: Secondary | ICD-10-CM | POA: Diagnosis not present

## 2020-07-26 DIAGNOSIS — E1129 Type 2 diabetes mellitus with other diabetic kidney complication: Secondary | ICD-10-CM | POA: Diagnosis not present

## 2020-07-26 DIAGNOSIS — D689 Coagulation defect, unspecified: Secondary | ICD-10-CM | POA: Diagnosis not present

## 2020-07-26 DIAGNOSIS — Z992 Dependence on renal dialysis: Secondary | ICD-10-CM | POA: Diagnosis not present

## 2020-07-26 DIAGNOSIS — D509 Iron deficiency anemia, unspecified: Secondary | ICD-10-CM | POA: Diagnosis not present

## 2020-07-29 DIAGNOSIS — D689 Coagulation defect, unspecified: Secondary | ICD-10-CM | POA: Diagnosis not present

## 2020-07-29 DIAGNOSIS — E1129 Type 2 diabetes mellitus with other diabetic kidney complication: Secondary | ICD-10-CM | POA: Diagnosis not present

## 2020-07-29 DIAGNOSIS — D509 Iron deficiency anemia, unspecified: Secondary | ICD-10-CM | POA: Diagnosis not present

## 2020-07-29 DIAGNOSIS — Z992 Dependence on renal dialysis: Secondary | ICD-10-CM | POA: Diagnosis not present

## 2020-07-29 DIAGNOSIS — N186 End stage renal disease: Secondary | ICD-10-CM | POA: Diagnosis not present

## 2020-07-29 DIAGNOSIS — N2581 Secondary hyperparathyroidism of renal origin: Secondary | ICD-10-CM | POA: Diagnosis not present

## 2020-07-31 DIAGNOSIS — Z992 Dependence on renal dialysis: Secondary | ICD-10-CM | POA: Diagnosis not present

## 2020-07-31 DIAGNOSIS — N186 End stage renal disease: Secondary | ICD-10-CM | POA: Diagnosis not present

## 2020-07-31 DIAGNOSIS — E1129 Type 2 diabetes mellitus with other diabetic kidney complication: Secondary | ICD-10-CM | POA: Diagnosis not present

## 2020-07-31 DIAGNOSIS — D509 Iron deficiency anemia, unspecified: Secondary | ICD-10-CM | POA: Diagnosis not present

## 2020-07-31 DIAGNOSIS — Z1152 Encounter for screening for COVID-19: Secondary | ICD-10-CM | POA: Diagnosis not present

## 2020-07-31 DIAGNOSIS — D689 Coagulation defect, unspecified: Secondary | ICD-10-CM | POA: Diagnosis not present

## 2020-07-31 DIAGNOSIS — N2581 Secondary hyperparathyroidism of renal origin: Secondary | ICD-10-CM | POA: Diagnosis not present

## 2020-08-02 DIAGNOSIS — N2581 Secondary hyperparathyroidism of renal origin: Secondary | ICD-10-CM | POA: Diagnosis not present

## 2020-08-02 DIAGNOSIS — E1129 Type 2 diabetes mellitus with other diabetic kidney complication: Secondary | ICD-10-CM | POA: Diagnosis not present

## 2020-08-02 DIAGNOSIS — Z992 Dependence on renal dialysis: Secondary | ICD-10-CM | POA: Diagnosis not present

## 2020-08-02 DIAGNOSIS — D689 Coagulation defect, unspecified: Secondary | ICD-10-CM | POA: Diagnosis not present

## 2020-08-02 DIAGNOSIS — D509 Iron deficiency anemia, unspecified: Secondary | ICD-10-CM | POA: Diagnosis not present

## 2020-08-02 DIAGNOSIS — N186 End stage renal disease: Secondary | ICD-10-CM | POA: Diagnosis not present

## 2020-08-04 DIAGNOSIS — N186 End stage renal disease: Secondary | ICD-10-CM | POA: Diagnosis not present

## 2020-08-04 DIAGNOSIS — N2581 Secondary hyperparathyroidism of renal origin: Secondary | ICD-10-CM | POA: Diagnosis not present

## 2020-08-04 DIAGNOSIS — E1129 Type 2 diabetes mellitus with other diabetic kidney complication: Secondary | ICD-10-CM | POA: Diagnosis not present

## 2020-08-04 DIAGNOSIS — D689 Coagulation defect, unspecified: Secondary | ICD-10-CM | POA: Diagnosis not present

## 2020-08-04 DIAGNOSIS — D509 Iron deficiency anemia, unspecified: Secondary | ICD-10-CM | POA: Diagnosis not present

## 2020-08-04 DIAGNOSIS — Z992 Dependence on renal dialysis: Secondary | ICD-10-CM | POA: Diagnosis not present

## 2020-08-06 DIAGNOSIS — D689 Coagulation defect, unspecified: Secondary | ICD-10-CM | POA: Diagnosis not present

## 2020-08-06 DIAGNOSIS — N2581 Secondary hyperparathyroidism of renal origin: Secondary | ICD-10-CM | POA: Diagnosis not present

## 2020-08-06 DIAGNOSIS — E1129 Type 2 diabetes mellitus with other diabetic kidney complication: Secondary | ICD-10-CM | POA: Diagnosis not present

## 2020-08-06 DIAGNOSIS — Z992 Dependence on renal dialysis: Secondary | ICD-10-CM | POA: Diagnosis not present

## 2020-08-06 DIAGNOSIS — D509 Iron deficiency anemia, unspecified: Secondary | ICD-10-CM | POA: Diagnosis not present

## 2020-08-06 DIAGNOSIS — N186 End stage renal disease: Secondary | ICD-10-CM | POA: Diagnosis not present

## 2020-08-08 DIAGNOSIS — N186 End stage renal disease: Secondary | ICD-10-CM | POA: Diagnosis not present

## 2020-08-08 DIAGNOSIS — D509 Iron deficiency anemia, unspecified: Secondary | ICD-10-CM | POA: Diagnosis not present

## 2020-08-08 DIAGNOSIS — D689 Coagulation defect, unspecified: Secondary | ICD-10-CM | POA: Diagnosis not present

## 2020-08-08 DIAGNOSIS — N2581 Secondary hyperparathyroidism of renal origin: Secondary | ICD-10-CM | POA: Diagnosis not present

## 2020-08-08 DIAGNOSIS — E1129 Type 2 diabetes mellitus with other diabetic kidney complication: Secondary | ICD-10-CM | POA: Diagnosis not present

## 2020-08-08 DIAGNOSIS — Z992 Dependence on renal dialysis: Secondary | ICD-10-CM | POA: Diagnosis not present

## 2020-08-11 DIAGNOSIS — N186 End stage renal disease: Secondary | ICD-10-CM | POA: Diagnosis not present

## 2020-08-11 DIAGNOSIS — N2581 Secondary hyperparathyroidism of renal origin: Secondary | ICD-10-CM | POA: Diagnosis not present

## 2020-08-11 DIAGNOSIS — E1122 Type 2 diabetes mellitus with diabetic chronic kidney disease: Secondary | ICD-10-CM | POA: Diagnosis not present

## 2020-08-11 DIAGNOSIS — Z992 Dependence on renal dialysis: Secondary | ICD-10-CM | POA: Diagnosis not present

## 2020-08-11 DIAGNOSIS — D509 Iron deficiency anemia, unspecified: Secondary | ICD-10-CM | POA: Diagnosis not present

## 2020-08-11 DIAGNOSIS — D689 Coagulation defect, unspecified: Secondary | ICD-10-CM | POA: Diagnosis not present

## 2020-08-11 DIAGNOSIS — E1129 Type 2 diabetes mellitus with other diabetic kidney complication: Secondary | ICD-10-CM | POA: Diagnosis not present

## 2020-08-13 DIAGNOSIS — D689 Coagulation defect, unspecified: Secondary | ICD-10-CM | POA: Diagnosis not present

## 2020-08-13 DIAGNOSIS — N2581 Secondary hyperparathyroidism of renal origin: Secondary | ICD-10-CM | POA: Diagnosis not present

## 2020-08-13 DIAGNOSIS — D631 Anemia in chronic kidney disease: Secondary | ICD-10-CM | POA: Diagnosis not present

## 2020-08-13 DIAGNOSIS — N186 End stage renal disease: Secondary | ICD-10-CM | POA: Diagnosis not present

## 2020-08-13 DIAGNOSIS — Z992 Dependence on renal dialysis: Secondary | ICD-10-CM | POA: Diagnosis not present

## 2020-08-13 DIAGNOSIS — D509 Iron deficiency anemia, unspecified: Secondary | ICD-10-CM | POA: Diagnosis not present

## 2020-08-16 DIAGNOSIS — N2581 Secondary hyperparathyroidism of renal origin: Secondary | ICD-10-CM | POA: Diagnosis not present

## 2020-08-16 DIAGNOSIS — Z1152 Encounter for screening for COVID-19: Secondary | ICD-10-CM | POA: Diagnosis not present

## 2020-08-16 DIAGNOSIS — D509 Iron deficiency anemia, unspecified: Secondary | ICD-10-CM | POA: Diagnosis not present

## 2020-08-16 DIAGNOSIS — D689 Coagulation defect, unspecified: Secondary | ICD-10-CM | POA: Diagnosis not present

## 2020-08-16 DIAGNOSIS — D631 Anemia in chronic kidney disease: Secondary | ICD-10-CM | POA: Diagnosis not present

## 2020-08-16 DIAGNOSIS — N186 End stage renal disease: Secondary | ICD-10-CM | POA: Diagnosis not present

## 2020-08-16 DIAGNOSIS — Z992 Dependence on renal dialysis: Secondary | ICD-10-CM | POA: Diagnosis not present

## 2020-08-19 DIAGNOSIS — D509 Iron deficiency anemia, unspecified: Secondary | ICD-10-CM | POA: Diagnosis not present

## 2020-08-19 DIAGNOSIS — D631 Anemia in chronic kidney disease: Secondary | ICD-10-CM | POA: Diagnosis not present

## 2020-08-19 DIAGNOSIS — N2581 Secondary hyperparathyroidism of renal origin: Secondary | ICD-10-CM | POA: Diagnosis not present

## 2020-08-19 DIAGNOSIS — D689 Coagulation defect, unspecified: Secondary | ICD-10-CM | POA: Diagnosis not present

## 2020-08-19 DIAGNOSIS — N186 End stage renal disease: Secondary | ICD-10-CM | POA: Diagnosis not present

## 2020-08-19 DIAGNOSIS — Z992 Dependence on renal dialysis: Secondary | ICD-10-CM | POA: Diagnosis not present

## 2020-08-21 DIAGNOSIS — N186 End stage renal disease: Secondary | ICD-10-CM | POA: Diagnosis not present

## 2020-08-21 DIAGNOSIS — D689 Coagulation defect, unspecified: Secondary | ICD-10-CM | POA: Diagnosis not present

## 2020-08-21 DIAGNOSIS — Z992 Dependence on renal dialysis: Secondary | ICD-10-CM | POA: Diagnosis not present

## 2020-08-21 DIAGNOSIS — D631 Anemia in chronic kidney disease: Secondary | ICD-10-CM | POA: Diagnosis not present

## 2020-08-21 DIAGNOSIS — D509 Iron deficiency anemia, unspecified: Secondary | ICD-10-CM | POA: Diagnosis not present

## 2020-08-21 DIAGNOSIS — N2581 Secondary hyperparathyroidism of renal origin: Secondary | ICD-10-CM | POA: Diagnosis not present

## 2020-08-23 DIAGNOSIS — N186 End stage renal disease: Secondary | ICD-10-CM | POA: Diagnosis not present

## 2020-08-23 DIAGNOSIS — D509 Iron deficiency anemia, unspecified: Secondary | ICD-10-CM | POA: Diagnosis not present

## 2020-08-23 DIAGNOSIS — N2581 Secondary hyperparathyroidism of renal origin: Secondary | ICD-10-CM | POA: Diagnosis not present

## 2020-08-23 DIAGNOSIS — D689 Coagulation defect, unspecified: Secondary | ICD-10-CM | POA: Diagnosis not present

## 2020-08-23 DIAGNOSIS — D631 Anemia in chronic kidney disease: Secondary | ICD-10-CM | POA: Diagnosis not present

## 2020-08-23 DIAGNOSIS — Z992 Dependence on renal dialysis: Secondary | ICD-10-CM | POA: Diagnosis not present

## 2020-08-26 DIAGNOSIS — N2581 Secondary hyperparathyroidism of renal origin: Secondary | ICD-10-CM | POA: Diagnosis not present

## 2020-08-26 DIAGNOSIS — D689 Coagulation defect, unspecified: Secondary | ICD-10-CM | POA: Diagnosis not present

## 2020-08-26 DIAGNOSIS — D631 Anemia in chronic kidney disease: Secondary | ICD-10-CM | POA: Diagnosis not present

## 2020-08-26 DIAGNOSIS — N186 End stage renal disease: Secondary | ICD-10-CM | POA: Diagnosis not present

## 2020-08-26 DIAGNOSIS — D509 Iron deficiency anemia, unspecified: Secondary | ICD-10-CM | POA: Diagnosis not present

## 2020-08-26 DIAGNOSIS — Z992 Dependence on renal dialysis: Secondary | ICD-10-CM | POA: Diagnosis not present

## 2020-08-28 DIAGNOSIS — D689 Coagulation defect, unspecified: Secondary | ICD-10-CM | POA: Diagnosis not present

## 2020-08-28 DIAGNOSIS — N2581 Secondary hyperparathyroidism of renal origin: Secondary | ICD-10-CM | POA: Diagnosis not present

## 2020-08-28 DIAGNOSIS — D509 Iron deficiency anemia, unspecified: Secondary | ICD-10-CM | POA: Diagnosis not present

## 2020-08-28 DIAGNOSIS — Z992 Dependence on renal dialysis: Secondary | ICD-10-CM | POA: Diagnosis not present

## 2020-08-28 DIAGNOSIS — D631 Anemia in chronic kidney disease: Secondary | ICD-10-CM | POA: Diagnosis not present

## 2020-08-28 DIAGNOSIS — N186 End stage renal disease: Secondary | ICD-10-CM | POA: Diagnosis not present

## 2020-08-30 DIAGNOSIS — N186 End stage renal disease: Secondary | ICD-10-CM | POA: Diagnosis not present

## 2020-08-30 DIAGNOSIS — N2581 Secondary hyperparathyroidism of renal origin: Secondary | ICD-10-CM | POA: Diagnosis not present

## 2020-08-30 DIAGNOSIS — D689 Coagulation defect, unspecified: Secondary | ICD-10-CM | POA: Diagnosis not present

## 2020-08-30 DIAGNOSIS — Z992 Dependence on renal dialysis: Secondary | ICD-10-CM | POA: Diagnosis not present

## 2020-08-30 DIAGNOSIS — D509 Iron deficiency anemia, unspecified: Secondary | ICD-10-CM | POA: Diagnosis not present

## 2020-08-30 DIAGNOSIS — D631 Anemia in chronic kidney disease: Secondary | ICD-10-CM | POA: Diagnosis not present

## 2020-09-02 DIAGNOSIS — D631 Anemia in chronic kidney disease: Secondary | ICD-10-CM | POA: Diagnosis not present

## 2020-09-02 DIAGNOSIS — Z992 Dependence on renal dialysis: Secondary | ICD-10-CM | POA: Diagnosis not present

## 2020-09-02 DIAGNOSIS — N2581 Secondary hyperparathyroidism of renal origin: Secondary | ICD-10-CM | POA: Diagnosis not present

## 2020-09-02 DIAGNOSIS — D689 Coagulation defect, unspecified: Secondary | ICD-10-CM | POA: Diagnosis not present

## 2020-09-02 DIAGNOSIS — D509 Iron deficiency anemia, unspecified: Secondary | ICD-10-CM | POA: Diagnosis not present

## 2020-09-02 DIAGNOSIS — N186 End stage renal disease: Secondary | ICD-10-CM | POA: Diagnosis not present

## 2020-09-04 DIAGNOSIS — D631 Anemia in chronic kidney disease: Secondary | ICD-10-CM | POA: Diagnosis not present

## 2020-09-04 DIAGNOSIS — Z992 Dependence on renal dialysis: Secondary | ICD-10-CM | POA: Diagnosis not present

## 2020-09-04 DIAGNOSIS — N2581 Secondary hyperparathyroidism of renal origin: Secondary | ICD-10-CM | POA: Diagnosis not present

## 2020-09-04 DIAGNOSIS — D689 Coagulation defect, unspecified: Secondary | ICD-10-CM | POA: Diagnosis not present

## 2020-09-04 DIAGNOSIS — D509 Iron deficiency anemia, unspecified: Secondary | ICD-10-CM | POA: Diagnosis not present

## 2020-09-04 DIAGNOSIS — N186 End stage renal disease: Secondary | ICD-10-CM | POA: Diagnosis not present

## 2020-09-06 DIAGNOSIS — D631 Anemia in chronic kidney disease: Secondary | ICD-10-CM | POA: Diagnosis not present

## 2020-09-06 DIAGNOSIS — D689 Coagulation defect, unspecified: Secondary | ICD-10-CM | POA: Diagnosis not present

## 2020-09-06 DIAGNOSIS — N186 End stage renal disease: Secondary | ICD-10-CM | POA: Diagnosis not present

## 2020-09-06 DIAGNOSIS — D509 Iron deficiency anemia, unspecified: Secondary | ICD-10-CM | POA: Diagnosis not present

## 2020-09-06 DIAGNOSIS — N2581 Secondary hyperparathyroidism of renal origin: Secondary | ICD-10-CM | POA: Diagnosis not present

## 2020-09-06 DIAGNOSIS — Z992 Dependence on renal dialysis: Secondary | ICD-10-CM | POA: Diagnosis not present

## 2020-09-08 DIAGNOSIS — N186 End stage renal disease: Secondary | ICD-10-CM | POA: Diagnosis not present

## 2020-09-08 DIAGNOSIS — Z992 Dependence on renal dialysis: Secondary | ICD-10-CM | POA: Diagnosis not present

## 2020-09-08 DIAGNOSIS — E1122 Type 2 diabetes mellitus with diabetic chronic kidney disease: Secondary | ICD-10-CM | POA: Diagnosis not present

## 2020-09-09 DIAGNOSIS — Z992 Dependence on renal dialysis: Secondary | ICD-10-CM | POA: Diagnosis not present

## 2020-09-09 DIAGNOSIS — N2581 Secondary hyperparathyroidism of renal origin: Secondary | ICD-10-CM | POA: Diagnosis not present

## 2020-09-09 DIAGNOSIS — N186 End stage renal disease: Secondary | ICD-10-CM | POA: Diagnosis not present

## 2020-09-09 DIAGNOSIS — D689 Coagulation defect, unspecified: Secondary | ICD-10-CM | POA: Diagnosis not present

## 2020-09-09 DIAGNOSIS — D509 Iron deficiency anemia, unspecified: Secondary | ICD-10-CM | POA: Diagnosis not present

## 2020-09-09 DIAGNOSIS — D631 Anemia in chronic kidney disease: Secondary | ICD-10-CM | POA: Diagnosis not present

## 2020-09-11 DIAGNOSIS — D509 Iron deficiency anemia, unspecified: Secondary | ICD-10-CM | POA: Diagnosis not present

## 2020-09-11 DIAGNOSIS — D689 Coagulation defect, unspecified: Secondary | ICD-10-CM | POA: Diagnosis not present

## 2020-09-11 DIAGNOSIS — D631 Anemia in chronic kidney disease: Secondary | ICD-10-CM | POA: Diagnosis not present

## 2020-09-11 DIAGNOSIS — Z992 Dependence on renal dialysis: Secondary | ICD-10-CM | POA: Diagnosis not present

## 2020-09-11 DIAGNOSIS — N186 End stage renal disease: Secondary | ICD-10-CM | POA: Diagnosis not present

## 2020-09-11 DIAGNOSIS — N2581 Secondary hyperparathyroidism of renal origin: Secondary | ICD-10-CM | POA: Diagnosis not present

## 2020-09-13 DIAGNOSIS — Z992 Dependence on renal dialysis: Secondary | ICD-10-CM | POA: Diagnosis not present

## 2020-09-13 DIAGNOSIS — N2581 Secondary hyperparathyroidism of renal origin: Secondary | ICD-10-CM | POA: Diagnosis not present

## 2020-09-13 DIAGNOSIS — D631 Anemia in chronic kidney disease: Secondary | ICD-10-CM | POA: Diagnosis not present

## 2020-09-13 DIAGNOSIS — D689 Coagulation defect, unspecified: Secondary | ICD-10-CM | POA: Diagnosis not present

## 2020-09-13 DIAGNOSIS — D509 Iron deficiency anemia, unspecified: Secondary | ICD-10-CM | POA: Diagnosis not present

## 2020-09-13 DIAGNOSIS — N186 End stage renal disease: Secondary | ICD-10-CM | POA: Diagnosis not present

## 2020-09-16 DIAGNOSIS — Z992 Dependence on renal dialysis: Secondary | ICD-10-CM | POA: Diagnosis not present

## 2020-09-16 DIAGNOSIS — N2581 Secondary hyperparathyroidism of renal origin: Secondary | ICD-10-CM | POA: Diagnosis not present

## 2020-09-16 DIAGNOSIS — D509 Iron deficiency anemia, unspecified: Secondary | ICD-10-CM | POA: Diagnosis not present

## 2020-09-16 DIAGNOSIS — D689 Coagulation defect, unspecified: Secondary | ICD-10-CM | POA: Diagnosis not present

## 2020-09-16 DIAGNOSIS — N186 End stage renal disease: Secondary | ICD-10-CM | POA: Diagnosis not present

## 2020-09-17 ENCOUNTER — Ambulatory Visit: Payer: Medicare Other | Admitting: Internal Medicine

## 2020-09-18 DIAGNOSIS — N2581 Secondary hyperparathyroidism of renal origin: Secondary | ICD-10-CM | POA: Diagnosis not present

## 2020-09-18 DIAGNOSIS — D689 Coagulation defect, unspecified: Secondary | ICD-10-CM | POA: Diagnosis not present

## 2020-09-18 DIAGNOSIS — N186 End stage renal disease: Secondary | ICD-10-CM | POA: Diagnosis not present

## 2020-09-18 DIAGNOSIS — D509 Iron deficiency anemia, unspecified: Secondary | ICD-10-CM | POA: Diagnosis not present

## 2020-09-18 DIAGNOSIS — Z992 Dependence on renal dialysis: Secondary | ICD-10-CM | POA: Diagnosis not present

## 2020-09-20 DIAGNOSIS — D509 Iron deficiency anemia, unspecified: Secondary | ICD-10-CM | POA: Diagnosis not present

## 2020-09-20 DIAGNOSIS — Z992 Dependence on renal dialysis: Secondary | ICD-10-CM | POA: Diagnosis not present

## 2020-09-20 DIAGNOSIS — N186 End stage renal disease: Secondary | ICD-10-CM | POA: Diagnosis not present

## 2020-09-20 DIAGNOSIS — D689 Coagulation defect, unspecified: Secondary | ICD-10-CM | POA: Diagnosis not present

## 2020-09-20 DIAGNOSIS — N2581 Secondary hyperparathyroidism of renal origin: Secondary | ICD-10-CM | POA: Diagnosis not present

## 2020-09-23 DIAGNOSIS — D509 Iron deficiency anemia, unspecified: Secondary | ICD-10-CM | POA: Diagnosis not present

## 2020-09-23 DIAGNOSIS — D689 Coagulation defect, unspecified: Secondary | ICD-10-CM | POA: Diagnosis not present

## 2020-09-23 DIAGNOSIS — Z992 Dependence on renal dialysis: Secondary | ICD-10-CM | POA: Diagnosis not present

## 2020-09-23 DIAGNOSIS — N2581 Secondary hyperparathyroidism of renal origin: Secondary | ICD-10-CM | POA: Diagnosis not present

## 2020-09-23 DIAGNOSIS — N186 End stage renal disease: Secondary | ICD-10-CM | POA: Diagnosis not present

## 2020-09-24 ENCOUNTER — Ambulatory Visit: Payer: Medicare Other | Admitting: Internal Medicine

## 2020-09-25 DIAGNOSIS — N186 End stage renal disease: Secondary | ICD-10-CM | POA: Diagnosis not present

## 2020-09-25 DIAGNOSIS — D689 Coagulation defect, unspecified: Secondary | ICD-10-CM | POA: Diagnosis not present

## 2020-09-25 DIAGNOSIS — N2581 Secondary hyperparathyroidism of renal origin: Secondary | ICD-10-CM | POA: Diagnosis not present

## 2020-09-25 DIAGNOSIS — Z992 Dependence on renal dialysis: Secondary | ICD-10-CM | POA: Diagnosis not present

## 2020-09-25 DIAGNOSIS — D509 Iron deficiency anemia, unspecified: Secondary | ICD-10-CM | POA: Diagnosis not present

## 2020-09-26 ENCOUNTER — Ambulatory Visit (INDEPENDENT_AMBULATORY_CARE_PROVIDER_SITE_OTHER): Payer: Medicare Other | Admitting: Internal Medicine

## 2020-09-26 ENCOUNTER — Other Ambulatory Visit: Payer: Self-pay

## 2020-09-26 ENCOUNTER — Encounter: Payer: Self-pay | Admitting: Internal Medicine

## 2020-09-26 VITALS — BP 138/76 | HR 101 | Temp 98.1°F | Ht 72.0 in | Wt 163.0 lb

## 2020-09-26 DIAGNOSIS — I700 Atherosclerosis of aorta: Secondary | ICD-10-CM | POA: Diagnosis not present

## 2020-09-26 DIAGNOSIS — E1129 Type 2 diabetes mellitus with other diabetic kidney complication: Secondary | ICD-10-CM | POA: Diagnosis not present

## 2020-09-26 DIAGNOSIS — E782 Mixed hyperlipidemia: Secondary | ICD-10-CM

## 2020-09-26 DIAGNOSIS — I10 Essential (primary) hypertension: Secondary | ICD-10-CM | POA: Diagnosis not present

## 2020-09-26 DIAGNOSIS — E559 Vitamin D deficiency, unspecified: Secondary | ICD-10-CM

## 2020-09-26 DIAGNOSIS — E538 Deficiency of other specified B group vitamins: Secondary | ICD-10-CM | POA: Diagnosis not present

## 2020-09-26 DIAGNOSIS — H9193 Unspecified hearing loss, bilateral: Secondary | ICD-10-CM

## 2020-09-26 DIAGNOSIS — Z0001 Encounter for general adult medical examination with abnormal findings: Secondary | ICD-10-CM | POA: Diagnosis not present

## 2020-09-26 LAB — LIPID PANEL
Cholesterol: 214 mg/dL — ABNORMAL HIGH (ref 0–200)
HDL: 38.5 mg/dL — ABNORMAL LOW (ref >39.00)
LDL Cholesterol: 149 mg/dL — ABNORMAL HIGH (ref 0–99)
NonHDL: 175.78
Total CHOL/HDL Ratio: 6
Triglycerides: 134 mg/dL (ref 0.0–149.0)
VLDL: 26.8 mg/dL (ref 0.0–40.0)

## 2020-09-26 LAB — BASIC METABOLIC PANEL
BUN: 35 mg/dL — ABNORMAL HIGH (ref 6–23)
CO2: 33 mEq/L — ABNORMAL HIGH (ref 19–32)
Calcium: 10 mg/dL (ref 8.4–10.5)
Chloride: 89 mEq/L — ABNORMAL LOW (ref 96–112)
Creatinine, Ser: 8.39 mg/dL (ref 0.40–1.50)
GFR: 6.35 mL/min — CL (ref >60.00)
Glucose, Bld: 79 mg/dL (ref 70–99)
Potassium: 4.1 mEq/L (ref 3.5–5.1)
Sodium: 136 mEq/L (ref 135–145)

## 2020-09-26 LAB — CBC WITH DIFFERENTIAL/PLATELET
Basophils Absolute: 0.1 10*3/uL (ref 0.0–0.1)
Basophils Relative: 1.1 % (ref 0.0–3.0)
Eosinophils Absolute: 0.4 10*3/uL (ref 0.0–0.7)
Eosinophils Relative: 4.3 % (ref 0.0–5.0)
HCT: 32.7 % — ABNORMAL LOW (ref 39.0–52.0)
Hemoglobin: 11.2 g/dL — ABNORMAL LOW (ref 13.0–17.0)
Lymphocytes Relative: 21.5 % (ref 12.0–46.0)
Lymphs Abs: 2 10*3/uL (ref 0.7–4.0)
MCHC: 34.1 g/dL (ref 30.0–36.0)
MCV: 100.4 fl — ABNORMAL HIGH (ref 78.0–100.0)
Monocytes Absolute: 0.6 10*3/uL (ref 0.1–1.0)
Monocytes Relative: 6.8 % (ref 3.0–12.0)
Neutro Abs: 6.1 10*3/uL (ref 1.4–7.7)
Neutrophils Relative %: 66.3 % (ref 43.0–77.0)
Platelets: 290 10*3/uL (ref 150.0–400.0)
RBC: 3.26 Mil/uL — ABNORMAL LOW (ref 4.22–5.81)
RDW: 14.8 % (ref 11.5–15.5)
WBC: 9.2 10*3/uL (ref 4.0–10.5)

## 2020-09-26 LAB — HEPATIC FUNCTION PANEL
ALT: 14 U/L (ref 0–53)
AST: 14 U/L (ref 0–37)
Albumin: 4.5 g/dL (ref 3.5–5.2)
Alkaline Phosphatase: 55 U/L (ref 39–117)
Bilirubin, Direct: 0 mg/dL (ref 0.0–0.3)
Total Bilirubin: 0.5 mg/dL (ref 0.2–1.2)
Total Protein: 9.3 g/dL — ABNORMAL HIGH (ref 6.0–8.3)

## 2020-09-26 LAB — TSH: TSH: 1.49 u[IU]/mL (ref 0.35–4.50)

## 2020-09-26 LAB — VITAMIN D 25 HYDROXY (VIT D DEFICIENCY, FRACTURES): VITD: 58.59 ng/mL (ref 30.00–100.00)

## 2020-09-26 LAB — HEMOGLOBIN A1C: Hgb A1c MFr Bld: 5.9 % (ref 4.6–6.5)

## 2020-09-26 LAB — VITAMIN B12: Vitamin B-12: 1241 pg/mL — ABNORMAL HIGH (ref 211–911)

## 2020-09-26 NOTE — Progress Notes (Signed)
Patient ID: Christopher Lopez, male   DOB: 1960-07-27, 60 y.o.   MRN: [id number]         Chief Complaint:: wellness exam and Follow-up new worsening bilateral hearing loss in past wk,  Aortic atherosclerosis, htn, hld, DM       HPI:  Christopher Lopez is a 60 y.o. male here for wellness exam; plans to have booster covid shot soon, has appt next mo with Dr Katy Fitch for eye f/u.  O/w up to date with prevention referrals and immunizations.                         Also has 1 wk onset worsening muffled hearing to both ears without HA, ear pain, drainage or d/c, ST, cough, dizziness or fever, chills.  Pt denies chest pain, increased sob or doe, wheezing, orthopnea, PND, increased LE swelling, palpitations, dizziness or syncope.   Pt denies polydipsia, polyuria,  Denies other worsening focal neuro s/s.   Pt denies fever, wt loss, night sweats, loss of appetite, or other constitutional symptoms     Wt Readings from Last 3 Encounters:  09/26/20 163 lb (73.9 kg)  03/19/20 162 lb (73.5 kg)  09/12/19 162 lb (73.5 kg)   BP Readings from Last 3 Encounters:  09/26/20 138/76  03/19/20 118/70  09/12/19 124/82   Immunization History  Administered Date(s) Administered  . Hepatitis B, adult 01/10/2018, 02/11/2018, 03/14/2018, 07/10/2018  . Influenza,inj,Quad PF,6+ Mos 02/27/2014, 08/22/2015, 09/06/2018, 03/09/2019, 05/06/2020  . Moderna Sars-Covid-2 Vaccination 09/20/2019, 10/25/2019  . Pneumococcal Conjugate-13 09/06/2018, 10/31/2018  . Pneumococcal Polysaccharide-23 08/20/2014, 08/22/2015  . Tdap 09/05/2012   There are no preventive care reminders to display for this patient.    Past Medical History:  Diagnosis Date  . Alcohol abuse 09/18/2011  . Allergic rhinitis, cause unspecified 09/20/2011  . Anemia, unspecified 09/18/2011  . Arthritis   . Childhood asthma 09/20/2011  . CKD (chronic kidney disease) stage 5, GFR less than 15 ml/min (HCC)    Dialysis - T/Th/Sa  . Colitis 09/18/2011  . Dementia ([place])  09/18/2011  . Diabetes mellitus   . Diverticulosis of colon without hemorrhage 02/27/2014  . Foot ulcer ([place]) 09/18/2011  . High cholesterol   . HTN (hypertension) 09/18/2011  . Hyperlipidemia 11/08/2011  . Hypertension   . Impaired glucose tolerance 09/18/2011  . Pneumonia 09/18/2011  . Pre-ulcerative corn or callous 11/20/2011  . Stroke [facility])    left leg deficit  . Type II or unspecified type diabetes mellitus without mention of complication, uncontrolled 09/20/2011  . Wears glasses    Past Surgical History:  Procedure Laterality Date  . AV FISTULA PLACEMENT Left 12/23/2017   Procedure: CREATION OF BASILIAC - CEPHALIC  ARTERIOVENOUS (AV) FISTULA  STAGE ONE;  Surgeon: Waynetta Sandy, MD;  Location: [place];  Service: Vascular;  Laterality: Left;  . AV FISTULA PLACEMENT Left 07/26/2018   Procedure: INSERTION OF ARTERIOVENOUS (AV) GORE-TEX GRAFT ARM;  Surgeon: Waynetta Sandy, MD;  Location: [place];  Service: Vascular;  Laterality: Left;  . BASCILIC VEIN TRANSPOSITION Left 03/01/2018   Procedure: BASILIC VEIN TRANSPOSITION SECOND STAGE;  Surgeon: Waynetta Sandy, MD;  Location: [place];  Service: Vascular;  Laterality: Left;  . INSERTION OF DIALYSIS CATHETER Right 12/23/2017   Procedure: INSERTION OF PALINDROME  DIALYSIS CATHETER;  Surgeon: Waynetta Sandy, MD;  Location: [place];  Service: Vascular;  Laterality: Right;  . THROMBECTOMY BRACHIAL ARTERY Left 07/26/2018   Procedure: THROMBECTOMY LEFT  UPPER EXTREMITY;  Surgeon: Waynetta Sandy, MD;  Location: [place];  Service: Vascular;  Laterality: Left;  . TONSILLECTOMY  1985  . VEIN REPAIR Left 07/26/2018   Procedure: STENT LEFT AXILLARY VEIN;  Surgeon: Waynetta Sandy, MD;  Location: [place];  Service: Vascular;  Laterality: Left;    reports that he has quit smoking. His smoking use included cigarettes. He smoked 0.50 packs per day. He has never used smokeless tobacco. He reports previous alcohol use of  about 2.0 standard drinks of alcohol per week. He reports that he does not use drugs. family history includes Diabetes in his maternal grandfather, mother, and sister; Heart disease in his mother; Prostate cancer in his paternal grandfather; Transient ischemic attack in his mother. Allergies  Allergen Reactions  . Lipitor [Atorvastatin] Other (See Comments)   Current Outpatient Medications on File Prior to Visit  Medication Sig Dispense Refill  . iron sucrose in sodium chloride 0.9 % 100 mL 50 mg.    . acetaminophen (TYLENOL) 650 MG CR tablet Take by mouth.    Marland Kitchen aspirin 81 MG EC tablet Take by mouth.    . cyanocobalamin 1000 MCG tablet Take by mouth.    . diltiazem (CARDIZEM LA) 360 MG 24 hr tablet Take by mouth.    . diphenhydrAMINE (BENADRYL) 25 mg capsule Take by mouth.    . ferric citrate (AURYXIA) 1 GM 210 MG(Fe) tablet Take by mouth.    Marland Kitchen glucose blood (ONE TOUCH ULTRA TEST) test strip Use as instructed once daily E11.9 100 each 12  . hydrALAZINE (APRESOLINE) 100 MG tablet Take by mouth.    Marland Kitchen ketoconazole (NIZORAL) 2 % cream Apply topically.    . Lancets (ONETOUCH ULTRASOFT) lancets 1 each by Other route 2 (two) times daily. Use to check blood sugars once a day Dx e11.9 100 each 3  . lovastatin (MEVACOR) 40 MG tablet Take 1 tablet (40 mg total) by mouth at bedtime. 90 tablet 2  . Methoxy PEG-Epoetin Beta (MIRCERA IJ) Mircera    . metoprolol succinate (TOPROL-XL) 50 MG 24 hr tablet Take 1 tablet (50 mg total) by mouth daily. 90 tablet 2  . Multiple Vitamin (MULTIVITAMIN ADULT PO) Take by mouth.    . oxyCODONE-acetaminophen (PERCOCET) 5-325 MG tablet Take 1 tablet by mouth every 6 (six) hours as needed for severe pain. 20 tablet 0  . pioglitazone (ACTOS) 15 MG tablet Take 1 tablet by mouth once daily 90 tablet 0  . rosuvastatin (CRESTOR) 20 MG tablet Take 1 tablet (20 mg total) by mouth daily. 90 tablet 3  . sitaGLIPtin (JANUVIA) 100 MG tablet Take by mouth.    . TRULICITY 1.5  SW/5.4OE SOPN INJECT 0.5ML INTO SKIN ONCE A WEEK 8 mL 2   No current facility-administered medications on file prior to visit.        ROS:  All others reviewed and negative.  Objective        PE:  BP 138/76   Pulse (!) 101   Temp 98.1 F (36.7 C) (Oral)   Ht 6' (1.829 m)   Wt 163 lb (73.9 kg)   SpO2 99%   BMI 22.11 kg/m                 Constitutional: Pt appears in NAD               HENT: Head: NCAT.                Right Ear: External  ear normal.                 Left Ear: External ear normal.                Bilateral ear wax impactions resolved with irrigation and hearing improved                Eyes: . Pupils are equal, round, and reactive to light. Conjunctivae and EOM are normal               Nose: without d/c or deformity               Neck: Neck supple. Gross normal ROM               Cardiovascular: Normal rate and regular rhythm.                 Pulmonary/Chest: Effort normal and breath sounds without rales or wheezing.                Abd:  Soft, NT, ND, + BS, no organomegaly               Neurological: Pt is alert. At baseline orientation, motor grossly intact               Skin: Skin is warm. No rashes, no other new lesions, LE edema - none               Psychiatric: Pt behavior is normal without agitation   Micro: none  Cardiac tracings I have personally interpreted today:  none  Pertinent Radiological findings (summarize): none   Lab Results  Component Value Date   WBC 9.2 09/26/2020   HGB 11.2 (L) 09/26/2020   HCT 32.7 (L) 09/26/2020   PLT 290.0 09/26/2020   GLUCOSE 79 09/26/2020   CHOL 214 (H) 09/26/2020   TRIG 134.0 09/26/2020   HDL 38.50 (L) 09/26/2020   LDLDIRECT 131.0 08/11/2017   LDLCALC 149 (H) 09/26/2020   ALT 14 09/26/2020   AST 14 09/26/2020   NA 136 09/26/2020   K 4.1 09/26/2020   CL 89 (L) 09/26/2020   CREATININE 8.39 (HH) 09/26/2020   BUN 35 (H) 09/26/2020   CO2 33 (H) 09/26/2020   TSH 1.49 09/26/2020   PSA 1.2 03/19/2020   HGBA1C  5.9 09/26/2020   MICROALBUR 98.3 (H) 08/11/2017   Assessment/Plan:  Christopher Lopez is a 60 y.o. Black or African American [2] male with  has a past medical history of Alcohol abuse (09/18/2011), Allergic rhinitis, cause unspecified (09/20/2011), Anemia, unspecified (09/18/2011), Arthritis, Childhood asthma (09/20/2011), CKD (chronic kidney disease) stage 5, GFR less than 15 ml/min ([place]), Colitis (09/18/2011), Dementia ([place]) (09/18/2011), Diabetes mellitus, Diverticulosis of colon without hemorrhage (02/27/2014), Foot ulcer ([place]) (09/18/2011), High cholesterol, HTN (hypertension) (09/18/2011), Hyperlipidemia (11/08/2011), Hypertension, Impaired glucose tolerance (09/18/2011), Pneumonia (09/18/2011), Pre-ulcerative corn or callous (11/20/2011), Stroke ([place]), Type II or unspecified type diabetes mellitus without mention of complication, uncontrolled (09/20/2011), and Wears glasses.  Encounter for well adult exam with abnormal findings Age and sex appropriate education and counseling updated with regular exercise and diet Referrals for preventative services - none needed, pt has plan for eye doctor appt next month Immunizations addressed - plans to have covid booster soon Smoking counseling  - none needed Evidence for depression or other mood disorder - none significant Most recent labs reviewed. I have personally reviewed and have noted: 1) the patient's medical and social history 2) The patient's current  medications and supplements 3) The patient's height, weight, and BMI have been recorded in the chart   Aortic atherosclerosis ([place]) To cont lovastatin and low chol diet  Bilateral hearing loss Improved after wax impaction irrigation,  to f/u any worsening symptoms or concerns  Essential (primary) hypertension BP Readings from Last 3 Encounters:  09/26/20 138/76  03/19/20 118/70  09/12/19 124/82   Stable, pt to continue medical treatment cardizem, hydralazine   Hyperlipidemia, unspecified Lab Results   Component Value Date   LDLCALC 149 (H) 09/26/2020   Stable, pt to restart current statin  - lovastatin 40, encourage compliance  Type 2 diabetes mellitus with other diabetic kidney complication [facility]) Lab Results  Component Value Date   HGBA1C 5.9 09/26/2020   Stable, pt to continue current medical treatment actos, januvia, trulicity   Followup: Return in about 6 months (around 03/29/2021).  Cathlean Cower, MD 09/28/2020 12:32 AM [facility]

## 2020-09-26 NOTE — Patient Instructions (Signed)
Your ears were cleared of wax impactions today  Please continue all other medications as before, and refills have been done if requested.  Please have the pharmacy call with any other refills you may need.  Please continue your efforts at being more active, low cholesterol diet, and weight control.  You are otherwise up to date with prevention measures today.  Please keep your appointments with your specialists as you may have planned  Please go to the LAB at the blood drawing area for the tests to be done  You will be contacted by phone if any changes need to be made immediately.  Otherwise, you will receive a letter about your results with an explanation, but please check with MyChart first.  Please remember to sign up for MyChart if you have not done so, as this will be important to you in the future with finding out test results, communicating by private email, and scheduling acute appointments online when needed.  Please make an Appointment to return in 6 months, or sooner if needed

## 2020-09-27 ENCOUNTER — Encounter: Payer: Self-pay | Admitting: Internal Medicine

## 2020-09-27 DIAGNOSIS — D509 Iron deficiency anemia, unspecified: Secondary | ICD-10-CM | POA: Diagnosis not present

## 2020-09-27 DIAGNOSIS — Z992 Dependence on renal dialysis: Secondary | ICD-10-CM | POA: Diagnosis not present

## 2020-09-27 DIAGNOSIS — N2581 Secondary hyperparathyroidism of renal origin: Secondary | ICD-10-CM | POA: Diagnosis not present

## 2020-09-27 DIAGNOSIS — D689 Coagulation defect, unspecified: Secondary | ICD-10-CM | POA: Diagnosis not present

## 2020-09-27 DIAGNOSIS — N186 End stage renal disease: Secondary | ICD-10-CM | POA: Diagnosis not present

## 2020-09-28 ENCOUNTER — Encounter: Payer: Self-pay | Admitting: Internal Medicine

## 2020-09-28 NOTE — Assessment & Plan Note (Signed)
Improved after wax impaction irrigation,  to f/u any worsening symptoms or concerns

## 2020-09-28 NOTE — Assessment & Plan Note (Signed)
Age and sex appropriate education and counseling updated with regular exercise and diet Referrals for preventative services - none needed, pt has plan for eye doctor appt next month Immunizations addressed - plans to have covid booster soon Smoking counseling  - none needed Evidence for depression or other mood disorder - none significant Most recent labs reviewed. I have personally reviewed and have noted: 1) the patient's medical and social history 2) The patient's current medications and supplements 3) The patient's height, weight, and BMI have been recorded in the chart

## 2020-09-28 NOTE — Assessment & Plan Note (Signed)
BP Readings from Last 3 Encounters:  09/26/20 138/76  03/19/20 118/70  09/12/19 124/82   Stable, pt to continue medical treatment cardizem, hydralazine

## 2020-09-28 NOTE — Assessment & Plan Note (Addendum)
Lab Results  Component Value Date   LDLCALC 149 (H) 09/26/2020   Stable, pt to restart current statin  - lovastatin 40, encourage compliance

## 2020-09-28 NOTE — Assessment & Plan Note (Signed)
Lab Results  Component Value Date   HGBA1C 5.9 09/26/2020   Stable, pt to continue current medical treatment actos, januvia, trulicity

## 2020-09-28 NOTE — Assessment & Plan Note (Signed)
To cont lovastatin and low chol diet

## 2020-09-29 ENCOUNTER — Telehealth: Payer: Self-pay | Admitting: Internal Medicine

## 2020-09-29 NOTE — Telephone Encounter (Signed)
[facility] FYI  Critical Lab result.   8.39 Creatinine previous 09/06/18 critical 7.88 6.35 GFR same 8.55 09/26/20 @ 11:23am  Lab data collection note corrections: Drawn today 09/26/20 @11 :23am Creatinine 8.39, GFR 6.35 Previous labs for reference drawn 09/06/18 Creatinine 7.88, GFR 8.55

## 2020-09-30 DIAGNOSIS — D509 Iron deficiency anemia, unspecified: Secondary | ICD-10-CM | POA: Diagnosis not present

## 2020-09-30 DIAGNOSIS — Z992 Dependence on renal dialysis: Secondary | ICD-10-CM | POA: Diagnosis not present

## 2020-09-30 DIAGNOSIS — N2581 Secondary hyperparathyroidism of renal origin: Secondary | ICD-10-CM | POA: Diagnosis not present

## 2020-09-30 DIAGNOSIS — D689 Coagulation defect, unspecified: Secondary | ICD-10-CM | POA: Diagnosis not present

## 2020-09-30 DIAGNOSIS — N186 End stage renal disease: Secondary | ICD-10-CM | POA: Diagnosis not present

## 2020-10-02 DIAGNOSIS — N2581 Secondary hyperparathyroidism of renal origin: Secondary | ICD-10-CM | POA: Diagnosis not present

## 2020-10-02 DIAGNOSIS — D689 Coagulation defect, unspecified: Secondary | ICD-10-CM | POA: Diagnosis not present

## 2020-10-02 DIAGNOSIS — D509 Iron deficiency anemia, unspecified: Secondary | ICD-10-CM | POA: Diagnosis not present

## 2020-10-02 DIAGNOSIS — N186 End stage renal disease: Secondary | ICD-10-CM | POA: Diagnosis not present

## 2020-10-02 DIAGNOSIS — Z992 Dependence on renal dialysis: Secondary | ICD-10-CM | POA: Diagnosis not present

## 2020-10-04 DIAGNOSIS — D689 Coagulation defect, unspecified: Secondary | ICD-10-CM | POA: Diagnosis not present

## 2020-10-04 DIAGNOSIS — N186 End stage renal disease: Secondary | ICD-10-CM | POA: Diagnosis not present

## 2020-10-04 DIAGNOSIS — Z992 Dependence on renal dialysis: Secondary | ICD-10-CM | POA: Diagnosis not present

## 2020-10-04 DIAGNOSIS — N2581 Secondary hyperparathyroidism of renal origin: Secondary | ICD-10-CM | POA: Diagnosis not present

## 2020-10-04 DIAGNOSIS — D509 Iron deficiency anemia, unspecified: Secondary | ICD-10-CM | POA: Diagnosis not present

## 2020-10-05 ENCOUNTER — Other Ambulatory Visit: Payer: Self-pay | Admitting: Internal Medicine

## 2020-10-06 NOTE — Telephone Encounter (Signed)
Please refill as per office routine med refill policy (all routine meds refilled for 3 mo or monthly per pt preference up to one year from last visit, then month to month grace period for 3 mo, then further med refills will have to be denied)  

## 2020-10-07 DIAGNOSIS — Z992 Dependence on renal dialysis: Secondary | ICD-10-CM | POA: Diagnosis not present

## 2020-10-07 DIAGNOSIS — N186 End stage renal disease: Secondary | ICD-10-CM | POA: Diagnosis not present

## 2020-10-07 DIAGNOSIS — N2581 Secondary hyperparathyroidism of renal origin: Secondary | ICD-10-CM | POA: Diagnosis not present

## 2020-10-07 DIAGNOSIS — D509 Iron deficiency anemia, unspecified: Secondary | ICD-10-CM | POA: Diagnosis not present

## 2020-10-07 DIAGNOSIS — D689 Coagulation defect, unspecified: Secondary | ICD-10-CM | POA: Diagnosis not present

## 2020-10-07 DIAGNOSIS — D631 Anemia in chronic kidney disease: Secondary | ICD-10-CM | POA: Diagnosis not present

## 2020-10-08 ENCOUNTER — Encounter: Payer: Self-pay | Admitting: Podiatry

## 2020-10-08 ENCOUNTER — Ambulatory Visit: Payer: Medicare Other | Admitting: Podiatry

## 2020-10-08 ENCOUNTER — Other Ambulatory Visit: Payer: Self-pay

## 2020-10-08 DIAGNOSIS — M79676 Pain in unspecified toe(s): Secondary | ICD-10-CM | POA: Diagnosis not present

## 2020-10-08 DIAGNOSIS — E1142 Type 2 diabetes mellitus with diabetic polyneuropathy: Secondary | ICD-10-CM

## 2020-10-08 DIAGNOSIS — B351 Tinea unguium: Secondary | ICD-10-CM

## 2020-10-08 DIAGNOSIS — Q828 Other specified congenital malformations of skin: Secondary | ICD-10-CM

## 2020-10-08 NOTE — Progress Notes (Signed)
This patient returns to my office for at risk foot care.  This patient requires this care by a professional since this patient will be at risk due to having  ESRD, CKD and diabetes.  This patient is unable to cut nails himself since the patient cannot reach his nails.These nails are painful walking and wearing shoes.  This patient presents for at risk foot care today.  General Appearance  Alert, conversant and in no acute stress.  Vascular  Dorsalis pedis and posterior tibial  pulses are palpable  bilaterally.  Capillary return is within normal limits  bilaterally. Temperature is within normal limits  bilaterally.  Neurologic  Senn-Weinstein monofilament wire test diminished   bilaterally. Muscle power within normal limits bilaterally.  Nails Thick disfigured discolored nails with subungual debris  from hallux to fifth toes bilaterally. No evidence of bacterial infection or drainage bilaterally.  Orthopedic  No limitations of motion  feet .  No crepitus or effusions noted.  No bony pathology or digital deformities noted.  Skin  normotropic skin  noted bilaterally.  No signs of infections or ulcers noted.   Porokeratosis sub 4  B/L symptomatic.  Onychomycosis  Pain in right toes  Pain in left toes  Consent was obtained for treatment procedures.   Mechanical debridement of nails 1-5  bilaterally performed with a nail nipper.  Filed with dremel without incident.  Debride porokeratosis with # 15 blade.   Return office visit   9 weeks                  Told patient to return for periodic foot care and evaluation due to potential at risk complications.   Gardiner Barefoot DPM

## 2020-10-09 DIAGNOSIS — D509 Iron deficiency anemia, unspecified: Secondary | ICD-10-CM | POA: Diagnosis not present

## 2020-10-09 DIAGNOSIS — N2581 Secondary hyperparathyroidism of renal origin: Secondary | ICD-10-CM | POA: Diagnosis not present

## 2020-10-09 DIAGNOSIS — E1122 Type 2 diabetes mellitus with diabetic chronic kidney disease: Secondary | ICD-10-CM | POA: Diagnosis not present

## 2020-10-09 DIAGNOSIS — D631 Anemia in chronic kidney disease: Secondary | ICD-10-CM | POA: Diagnosis not present

## 2020-10-09 DIAGNOSIS — Z992 Dependence on renal dialysis: Secondary | ICD-10-CM | POA: Diagnosis not present

## 2020-10-09 DIAGNOSIS — N186 End stage renal disease: Secondary | ICD-10-CM | POA: Diagnosis not present

## 2020-10-09 DIAGNOSIS — D689 Coagulation defect, unspecified: Secondary | ICD-10-CM | POA: Diagnosis not present

## 2020-10-11 DIAGNOSIS — Z992 Dependence on renal dialysis: Secondary | ICD-10-CM | POA: Diagnosis not present

## 2020-10-11 DIAGNOSIS — N2581 Secondary hyperparathyroidism of renal origin: Secondary | ICD-10-CM | POA: Diagnosis not present

## 2020-10-11 DIAGNOSIS — N186 End stage renal disease: Secondary | ICD-10-CM | POA: Diagnosis not present

## 2020-10-11 DIAGNOSIS — D689 Coagulation defect, unspecified: Secondary | ICD-10-CM | POA: Diagnosis not present

## 2020-10-14 DIAGNOSIS — D509 Iron deficiency anemia, unspecified: Secondary | ICD-10-CM | POA: Diagnosis not present

## 2020-10-14 DIAGNOSIS — N2581 Secondary hyperparathyroidism of renal origin: Secondary | ICD-10-CM | POA: Diagnosis not present

## 2020-10-14 DIAGNOSIS — D689 Coagulation defect, unspecified: Secondary | ICD-10-CM | POA: Diagnosis not present

## 2020-10-14 DIAGNOSIS — Z992 Dependence on renal dialysis: Secondary | ICD-10-CM | POA: Diagnosis not present

## 2020-10-14 DIAGNOSIS — N186 End stage renal disease: Secondary | ICD-10-CM | POA: Diagnosis not present

## 2020-10-16 DIAGNOSIS — D509 Iron deficiency anemia, unspecified: Secondary | ICD-10-CM | POA: Diagnosis not present

## 2020-10-16 DIAGNOSIS — N186 End stage renal disease: Secondary | ICD-10-CM | POA: Diagnosis not present

## 2020-10-16 DIAGNOSIS — D689 Coagulation defect, unspecified: Secondary | ICD-10-CM | POA: Diagnosis not present

## 2020-10-16 DIAGNOSIS — N2581 Secondary hyperparathyroidism of renal origin: Secondary | ICD-10-CM | POA: Diagnosis not present

## 2020-10-16 DIAGNOSIS — Z992 Dependence on renal dialysis: Secondary | ICD-10-CM | POA: Diagnosis not present

## 2020-10-18 DIAGNOSIS — N186 End stage renal disease: Secondary | ICD-10-CM | POA: Diagnosis not present

## 2020-10-18 DIAGNOSIS — D689 Coagulation defect, unspecified: Secondary | ICD-10-CM | POA: Diagnosis not present

## 2020-10-18 DIAGNOSIS — N2581 Secondary hyperparathyroidism of renal origin: Secondary | ICD-10-CM | POA: Diagnosis not present

## 2020-10-18 DIAGNOSIS — Z992 Dependence on renal dialysis: Secondary | ICD-10-CM | POA: Diagnosis not present

## 2020-10-18 DIAGNOSIS — D509 Iron deficiency anemia, unspecified: Secondary | ICD-10-CM | POA: Diagnosis not present

## 2020-10-19 ENCOUNTER — Other Ambulatory Visit: Payer: Self-pay | Admitting: Internal Medicine

## 2020-10-19 NOTE — Telephone Encounter (Signed)
Please refill as per office routine med refill policy (all routine meds refilled for 3 mo or monthly per pt preference up to one year from last visit, then month to month grace period for 3 mo, then further med refills will have to be denied)  

## 2020-10-21 DIAGNOSIS — D509 Iron deficiency anemia, unspecified: Secondary | ICD-10-CM | POA: Diagnosis not present

## 2020-10-21 DIAGNOSIS — Z992 Dependence on renal dialysis: Secondary | ICD-10-CM | POA: Diagnosis not present

## 2020-10-21 DIAGNOSIS — D689 Coagulation defect, unspecified: Secondary | ICD-10-CM | POA: Diagnosis not present

## 2020-10-21 DIAGNOSIS — N2581 Secondary hyperparathyroidism of renal origin: Secondary | ICD-10-CM | POA: Diagnosis not present

## 2020-10-21 DIAGNOSIS — N186 End stage renal disease: Secondary | ICD-10-CM | POA: Diagnosis not present

## 2020-10-23 DIAGNOSIS — D689 Coagulation defect, unspecified: Secondary | ICD-10-CM | POA: Diagnosis not present

## 2020-10-23 DIAGNOSIS — D509 Iron deficiency anemia, unspecified: Secondary | ICD-10-CM | POA: Diagnosis not present

## 2020-10-23 DIAGNOSIS — N2581 Secondary hyperparathyroidism of renal origin: Secondary | ICD-10-CM | POA: Diagnosis not present

## 2020-10-23 DIAGNOSIS — Z992 Dependence on renal dialysis: Secondary | ICD-10-CM | POA: Diagnosis not present

## 2020-10-23 DIAGNOSIS — N186 End stage renal disease: Secondary | ICD-10-CM | POA: Diagnosis not present

## 2020-10-25 DIAGNOSIS — N186 End stage renal disease: Secondary | ICD-10-CM | POA: Diagnosis not present

## 2020-10-25 DIAGNOSIS — D689 Coagulation defect, unspecified: Secondary | ICD-10-CM | POA: Diagnosis not present

## 2020-10-25 DIAGNOSIS — D509 Iron deficiency anemia, unspecified: Secondary | ICD-10-CM | POA: Diagnosis not present

## 2020-10-25 DIAGNOSIS — Z992 Dependence on renal dialysis: Secondary | ICD-10-CM | POA: Diagnosis not present

## 2020-10-25 DIAGNOSIS — N2581 Secondary hyperparathyroidism of renal origin: Secondary | ICD-10-CM | POA: Diagnosis not present

## 2020-10-28 DIAGNOSIS — D689 Coagulation defect, unspecified: Secondary | ICD-10-CM | POA: Diagnosis not present

## 2020-10-28 DIAGNOSIS — N186 End stage renal disease: Secondary | ICD-10-CM | POA: Diagnosis not present

## 2020-10-28 DIAGNOSIS — Z992 Dependence on renal dialysis: Secondary | ICD-10-CM | POA: Diagnosis not present

## 2020-10-28 DIAGNOSIS — E1129 Type 2 diabetes mellitus with other diabetic kidney complication: Secondary | ICD-10-CM | POA: Diagnosis not present

## 2020-10-28 DIAGNOSIS — D509 Iron deficiency anemia, unspecified: Secondary | ICD-10-CM | POA: Diagnosis not present

## 2020-10-28 DIAGNOSIS — N2581 Secondary hyperparathyroidism of renal origin: Secondary | ICD-10-CM | POA: Diagnosis not present

## 2020-10-30 DIAGNOSIS — N186 End stage renal disease: Secondary | ICD-10-CM | POA: Diagnosis not present

## 2020-10-30 DIAGNOSIS — N2581 Secondary hyperparathyroidism of renal origin: Secondary | ICD-10-CM | POA: Diagnosis not present

## 2020-10-30 DIAGNOSIS — D509 Iron deficiency anemia, unspecified: Secondary | ICD-10-CM | POA: Diagnosis not present

## 2020-10-30 DIAGNOSIS — D689 Coagulation defect, unspecified: Secondary | ICD-10-CM | POA: Diagnosis not present

## 2020-10-30 DIAGNOSIS — E1129 Type 2 diabetes mellitus with other diabetic kidney complication: Secondary | ICD-10-CM | POA: Diagnosis not present

## 2020-10-30 DIAGNOSIS — Z992 Dependence on renal dialysis: Secondary | ICD-10-CM | POA: Diagnosis not present

## 2020-11-01 DIAGNOSIS — N186 End stage renal disease: Secondary | ICD-10-CM | POA: Diagnosis not present

## 2020-11-01 DIAGNOSIS — E1129 Type 2 diabetes mellitus with other diabetic kidney complication: Secondary | ICD-10-CM | POA: Diagnosis not present

## 2020-11-01 DIAGNOSIS — D509 Iron deficiency anemia, unspecified: Secondary | ICD-10-CM | POA: Diagnosis not present

## 2020-11-01 DIAGNOSIS — Z992 Dependence on renal dialysis: Secondary | ICD-10-CM | POA: Diagnosis not present

## 2020-11-01 DIAGNOSIS — D689 Coagulation defect, unspecified: Secondary | ICD-10-CM | POA: Diagnosis not present

## 2020-11-01 DIAGNOSIS — N2581 Secondary hyperparathyroidism of renal origin: Secondary | ICD-10-CM | POA: Diagnosis not present

## 2020-11-04 DIAGNOSIS — D689 Coagulation defect, unspecified: Secondary | ICD-10-CM | POA: Diagnosis not present

## 2020-11-04 DIAGNOSIS — D631 Anemia in chronic kidney disease: Secondary | ICD-10-CM | POA: Diagnosis not present

## 2020-11-04 DIAGNOSIS — Z992 Dependence on renal dialysis: Secondary | ICD-10-CM | POA: Diagnosis not present

## 2020-11-04 DIAGNOSIS — D509 Iron deficiency anemia, unspecified: Secondary | ICD-10-CM | POA: Diagnosis not present

## 2020-11-04 DIAGNOSIS — N186 End stage renal disease: Secondary | ICD-10-CM | POA: Diagnosis not present

## 2020-11-04 DIAGNOSIS — N2581 Secondary hyperparathyroidism of renal origin: Secondary | ICD-10-CM | POA: Diagnosis not present

## 2020-11-06 DIAGNOSIS — D631 Anemia in chronic kidney disease: Secondary | ICD-10-CM | POA: Diagnosis not present

## 2020-11-06 DIAGNOSIS — Z992 Dependence on renal dialysis: Secondary | ICD-10-CM | POA: Diagnosis not present

## 2020-11-06 DIAGNOSIS — D509 Iron deficiency anemia, unspecified: Secondary | ICD-10-CM | POA: Diagnosis not present

## 2020-11-06 DIAGNOSIS — N186 End stage renal disease: Secondary | ICD-10-CM | POA: Diagnosis not present

## 2020-11-06 DIAGNOSIS — N2581 Secondary hyperparathyroidism of renal origin: Secondary | ICD-10-CM | POA: Diagnosis not present

## 2020-11-06 DIAGNOSIS — D689 Coagulation defect, unspecified: Secondary | ICD-10-CM | POA: Diagnosis not present

## 2020-11-08 DIAGNOSIS — D689 Coagulation defect, unspecified: Secondary | ICD-10-CM | POA: Diagnosis not present

## 2020-11-08 DIAGNOSIS — D631 Anemia in chronic kidney disease: Secondary | ICD-10-CM | POA: Diagnosis not present

## 2020-11-08 DIAGNOSIS — Z992 Dependence on renal dialysis: Secondary | ICD-10-CM | POA: Diagnosis not present

## 2020-11-08 DIAGNOSIS — E1122 Type 2 diabetes mellitus with diabetic chronic kidney disease: Secondary | ICD-10-CM | POA: Diagnosis not present

## 2020-11-08 DIAGNOSIS — N186 End stage renal disease: Secondary | ICD-10-CM | POA: Diagnosis not present

## 2020-11-08 DIAGNOSIS — N2581 Secondary hyperparathyroidism of renal origin: Secondary | ICD-10-CM | POA: Diagnosis not present

## 2020-11-08 DIAGNOSIS — D509 Iron deficiency anemia, unspecified: Secondary | ICD-10-CM | POA: Diagnosis not present

## 2020-11-11 DIAGNOSIS — N2581 Secondary hyperparathyroidism of renal origin: Secondary | ICD-10-CM | POA: Diagnosis not present

## 2020-11-11 DIAGNOSIS — N186 End stage renal disease: Secondary | ICD-10-CM | POA: Diagnosis not present

## 2020-11-11 DIAGNOSIS — D509 Iron deficiency anemia, unspecified: Secondary | ICD-10-CM | POA: Diagnosis not present

## 2020-11-11 DIAGNOSIS — D689 Coagulation defect, unspecified: Secondary | ICD-10-CM | POA: Diagnosis not present

## 2020-11-11 DIAGNOSIS — Z992 Dependence on renal dialysis: Secondary | ICD-10-CM | POA: Diagnosis not present

## 2020-11-13 DIAGNOSIS — N186 End stage renal disease: Secondary | ICD-10-CM | POA: Diagnosis not present

## 2020-11-13 DIAGNOSIS — D509 Iron deficiency anemia, unspecified: Secondary | ICD-10-CM | POA: Diagnosis not present

## 2020-11-13 DIAGNOSIS — D689 Coagulation defect, unspecified: Secondary | ICD-10-CM | POA: Diagnosis not present

## 2020-11-13 DIAGNOSIS — N2581 Secondary hyperparathyroidism of renal origin: Secondary | ICD-10-CM | POA: Diagnosis not present

## 2020-11-13 DIAGNOSIS — Z992 Dependence on renal dialysis: Secondary | ICD-10-CM | POA: Diagnosis not present

## 2020-11-15 DIAGNOSIS — D509 Iron deficiency anemia, unspecified: Secondary | ICD-10-CM | POA: Diagnosis not present

## 2020-11-15 DIAGNOSIS — N2581 Secondary hyperparathyroidism of renal origin: Secondary | ICD-10-CM | POA: Diagnosis not present

## 2020-11-15 DIAGNOSIS — Z992 Dependence on renal dialysis: Secondary | ICD-10-CM | POA: Diagnosis not present

## 2020-11-15 DIAGNOSIS — D689 Coagulation defect, unspecified: Secondary | ICD-10-CM | POA: Diagnosis not present

## 2020-11-15 DIAGNOSIS — N186 End stage renal disease: Secondary | ICD-10-CM | POA: Diagnosis not present

## 2020-11-18 DIAGNOSIS — D689 Coagulation defect, unspecified: Secondary | ICD-10-CM | POA: Diagnosis not present

## 2020-11-18 DIAGNOSIS — N186 End stage renal disease: Secondary | ICD-10-CM | POA: Diagnosis not present

## 2020-11-18 DIAGNOSIS — Z992 Dependence on renal dialysis: Secondary | ICD-10-CM | POA: Diagnosis not present

## 2020-11-18 DIAGNOSIS — D509 Iron deficiency anemia, unspecified: Secondary | ICD-10-CM | POA: Diagnosis not present

## 2020-11-18 DIAGNOSIS — N2581 Secondary hyperparathyroidism of renal origin: Secondary | ICD-10-CM | POA: Diagnosis not present

## 2020-11-20 DIAGNOSIS — N186 End stage renal disease: Secondary | ICD-10-CM | POA: Diagnosis not present

## 2020-11-20 DIAGNOSIS — N2581 Secondary hyperparathyroidism of renal origin: Secondary | ICD-10-CM | POA: Diagnosis not present

## 2020-11-20 DIAGNOSIS — D509 Iron deficiency anemia, unspecified: Secondary | ICD-10-CM | POA: Diagnosis not present

## 2020-11-20 DIAGNOSIS — D689 Coagulation defect, unspecified: Secondary | ICD-10-CM | POA: Diagnosis not present

## 2020-11-20 DIAGNOSIS — Z992 Dependence on renal dialysis: Secondary | ICD-10-CM | POA: Diagnosis not present

## 2020-11-22 DIAGNOSIS — N2581 Secondary hyperparathyroidism of renal origin: Secondary | ICD-10-CM | POA: Diagnosis not present

## 2020-11-22 DIAGNOSIS — D689 Coagulation defect, unspecified: Secondary | ICD-10-CM | POA: Diagnosis not present

## 2020-11-22 DIAGNOSIS — Z992 Dependence on renal dialysis: Secondary | ICD-10-CM | POA: Diagnosis not present

## 2020-11-22 DIAGNOSIS — D509 Iron deficiency anemia, unspecified: Secondary | ICD-10-CM | POA: Diagnosis not present

## 2020-11-22 DIAGNOSIS — N186 End stage renal disease: Secondary | ICD-10-CM | POA: Diagnosis not present

## 2020-11-25 DIAGNOSIS — N2581 Secondary hyperparathyroidism of renal origin: Secondary | ICD-10-CM | POA: Diagnosis not present

## 2020-11-25 DIAGNOSIS — D689 Coagulation defect, unspecified: Secondary | ICD-10-CM | POA: Diagnosis not present

## 2020-11-25 DIAGNOSIS — D509 Iron deficiency anemia, unspecified: Secondary | ICD-10-CM | POA: Diagnosis not present

## 2020-11-25 DIAGNOSIS — N186 End stage renal disease: Secondary | ICD-10-CM | POA: Diagnosis not present

## 2020-11-25 DIAGNOSIS — Z992 Dependence on renal dialysis: Secondary | ICD-10-CM | POA: Diagnosis not present

## 2020-11-27 DIAGNOSIS — D509 Iron deficiency anemia, unspecified: Secondary | ICD-10-CM | POA: Diagnosis not present

## 2020-11-27 DIAGNOSIS — N186 End stage renal disease: Secondary | ICD-10-CM | POA: Diagnosis not present

## 2020-11-27 DIAGNOSIS — Z992 Dependence on renal dialysis: Secondary | ICD-10-CM | POA: Diagnosis not present

## 2020-11-27 DIAGNOSIS — D689 Coagulation defect, unspecified: Secondary | ICD-10-CM | POA: Diagnosis not present

## 2020-11-27 DIAGNOSIS — N2581 Secondary hyperparathyroidism of renal origin: Secondary | ICD-10-CM | POA: Diagnosis not present

## 2020-11-29 DIAGNOSIS — N2581 Secondary hyperparathyroidism of renal origin: Secondary | ICD-10-CM | POA: Diagnosis not present

## 2020-11-29 DIAGNOSIS — D509 Iron deficiency anemia, unspecified: Secondary | ICD-10-CM | POA: Diagnosis not present

## 2020-11-29 DIAGNOSIS — D689 Coagulation defect, unspecified: Secondary | ICD-10-CM | POA: Diagnosis not present

## 2020-11-29 DIAGNOSIS — Z992 Dependence on renal dialysis: Secondary | ICD-10-CM | POA: Diagnosis not present

## 2020-11-29 DIAGNOSIS — N186 End stage renal disease: Secondary | ICD-10-CM | POA: Diagnosis not present

## 2020-12-02 DIAGNOSIS — D689 Coagulation defect, unspecified: Secondary | ICD-10-CM | POA: Diagnosis not present

## 2020-12-02 DIAGNOSIS — N186 End stage renal disease: Secondary | ICD-10-CM | POA: Diagnosis not present

## 2020-12-02 DIAGNOSIS — Z992 Dependence on renal dialysis: Secondary | ICD-10-CM | POA: Diagnosis not present

## 2020-12-02 DIAGNOSIS — N2581 Secondary hyperparathyroidism of renal origin: Secondary | ICD-10-CM | POA: Diagnosis not present

## 2020-12-04 DIAGNOSIS — Z992 Dependence on renal dialysis: Secondary | ICD-10-CM | POA: Diagnosis not present

## 2020-12-04 DIAGNOSIS — N2581 Secondary hyperparathyroidism of renal origin: Secondary | ICD-10-CM | POA: Diagnosis not present

## 2020-12-04 DIAGNOSIS — N186 End stage renal disease: Secondary | ICD-10-CM | POA: Diagnosis not present

## 2020-12-04 DIAGNOSIS — D689 Coagulation defect, unspecified: Secondary | ICD-10-CM | POA: Diagnosis not present

## 2020-12-06 DIAGNOSIS — N2581 Secondary hyperparathyroidism of renal origin: Secondary | ICD-10-CM | POA: Diagnosis not present

## 2020-12-06 DIAGNOSIS — N186 End stage renal disease: Secondary | ICD-10-CM | POA: Diagnosis not present

## 2020-12-06 DIAGNOSIS — D689 Coagulation defect, unspecified: Secondary | ICD-10-CM | POA: Diagnosis not present

## 2020-12-06 DIAGNOSIS — Z992 Dependence on renal dialysis: Secondary | ICD-10-CM | POA: Diagnosis not present

## 2020-12-09 DIAGNOSIS — N2581 Secondary hyperparathyroidism of renal origin: Secondary | ICD-10-CM | POA: Diagnosis not present

## 2020-12-09 DIAGNOSIS — Z992 Dependence on renal dialysis: Secondary | ICD-10-CM | POA: Diagnosis not present

## 2020-12-09 DIAGNOSIS — E1122 Type 2 diabetes mellitus with diabetic chronic kidney disease: Secondary | ICD-10-CM | POA: Diagnosis not present

## 2020-12-09 DIAGNOSIS — N186 End stage renal disease: Secondary | ICD-10-CM | POA: Diagnosis not present

## 2020-12-09 DIAGNOSIS — D689 Coagulation defect, unspecified: Secondary | ICD-10-CM | POA: Diagnosis not present

## 2020-12-11 DIAGNOSIS — Z992 Dependence on renal dialysis: Secondary | ICD-10-CM | POA: Diagnosis not present

## 2020-12-11 DIAGNOSIS — N186 End stage renal disease: Secondary | ICD-10-CM | POA: Diagnosis not present

## 2020-12-11 DIAGNOSIS — N2581 Secondary hyperparathyroidism of renal origin: Secondary | ICD-10-CM | POA: Diagnosis not present

## 2020-12-11 DIAGNOSIS — D689 Coagulation defect, unspecified: Secondary | ICD-10-CM | POA: Diagnosis not present

## 2020-12-13 DIAGNOSIS — N2581 Secondary hyperparathyroidism of renal origin: Secondary | ICD-10-CM | POA: Diagnosis not present

## 2020-12-13 DIAGNOSIS — N186 End stage renal disease: Secondary | ICD-10-CM | POA: Diagnosis not present

## 2020-12-13 DIAGNOSIS — D689 Coagulation defect, unspecified: Secondary | ICD-10-CM | POA: Diagnosis not present

## 2020-12-13 DIAGNOSIS — Z992 Dependence on renal dialysis: Secondary | ICD-10-CM | POA: Diagnosis not present

## 2020-12-15 ENCOUNTER — Other Ambulatory Visit: Payer: Self-pay | Admitting: Internal Medicine

## 2020-12-15 NOTE — Telephone Encounter (Signed)
Please refill as per office routine med refill policy (all routine meds refilled for 3 mo or monthly per pt preference up to one year from last visit, then month to month grace period for 3 mo, then further med refills will have to be denied)  

## 2020-12-16 DIAGNOSIS — D689 Coagulation defect, unspecified: Secondary | ICD-10-CM | POA: Diagnosis not present

## 2020-12-16 DIAGNOSIS — N186 End stage renal disease: Secondary | ICD-10-CM | POA: Diagnosis not present

## 2020-12-16 DIAGNOSIS — D631 Anemia in chronic kidney disease: Secondary | ICD-10-CM | POA: Diagnosis not present

## 2020-12-16 DIAGNOSIS — N2581 Secondary hyperparathyroidism of renal origin: Secondary | ICD-10-CM | POA: Diagnosis not present

## 2020-12-16 DIAGNOSIS — Z992 Dependence on renal dialysis: Secondary | ICD-10-CM | POA: Diagnosis not present

## 2020-12-17 ENCOUNTER — Encounter: Payer: Self-pay | Admitting: Podiatry

## 2020-12-17 ENCOUNTER — Ambulatory Visit: Payer: Medicare Other | Admitting: Podiatry

## 2020-12-17 ENCOUNTER — Other Ambulatory Visit: Payer: Self-pay

## 2020-12-17 DIAGNOSIS — B351 Tinea unguium: Secondary | ICD-10-CM | POA: Diagnosis not present

## 2020-12-17 DIAGNOSIS — E1142 Type 2 diabetes mellitus with diabetic polyneuropathy: Secondary | ICD-10-CM

## 2020-12-17 DIAGNOSIS — Q828 Other specified congenital malformations of skin: Secondary | ICD-10-CM | POA: Diagnosis not present

## 2020-12-17 DIAGNOSIS — M79676 Pain in unspecified toe(s): Secondary | ICD-10-CM | POA: Diagnosis not present

## 2020-12-17 NOTE — Progress Notes (Signed)
This patient returns to my office for at risk foot care.  This patient requires this care by a professional since this patient will be at risk due to having  ESRD, CKD and diabetes.  This patient is unable to cut nails himself since the patient cannot reach his nails.These nails are painful walking and wearing shoes.  This patient presents for at risk foot care today.  General Appearance  Alert, conversant and in no acute stress.  Vascular  Dorsalis pedis and posterior tibial  pulses are palpable  bilaterally.  Capillary return is within normal limits  bilaterally. Temperature is within normal limits  bilaterally.  Neurologic  Senn-Weinstein monofilament wire test diminished   bilaterally. Muscle power within normal limits bilaterally.  Nails Thick disfigured discolored nails with subungual debris  from hallux to fifth toes bilaterally. No evidence of bacterial infection or drainage bilaterally.  Orthopedic  No limitations of motion  feet .  No crepitus or effusions noted.  No bony pathology or digital deformities noted.  Skin  normotropic skin  noted bilaterally.  No signs of infections or ulcers noted.   Porokeratosis sub 4  B/L symptomatic.  Onychomycosis  Pain in right toes  Pain in left toes  Consent was obtained for treatment procedures.   Mechanical debridement of nails 1-5  bilaterally performed with a nail nipper.  Filed with dremel without incident.  Debride porokeratosis with # 15 blade.   Return office visit   9 weeks                  Told patient to return for periodic foot care and evaluation due to potential at risk complications.   Gardiner Barefoot DPM

## 2020-12-18 DIAGNOSIS — D631 Anemia in chronic kidney disease: Secondary | ICD-10-CM | POA: Diagnosis not present

## 2020-12-18 DIAGNOSIS — Z992 Dependence on renal dialysis: Secondary | ICD-10-CM | POA: Diagnosis not present

## 2020-12-18 DIAGNOSIS — N2581 Secondary hyperparathyroidism of renal origin: Secondary | ICD-10-CM | POA: Diagnosis not present

## 2020-12-18 DIAGNOSIS — D689 Coagulation defect, unspecified: Secondary | ICD-10-CM | POA: Diagnosis not present

## 2020-12-18 DIAGNOSIS — N186 End stage renal disease: Secondary | ICD-10-CM | POA: Diagnosis not present

## 2020-12-20 DIAGNOSIS — Z992 Dependence on renal dialysis: Secondary | ICD-10-CM | POA: Diagnosis not present

## 2020-12-20 DIAGNOSIS — N186 End stage renal disease: Secondary | ICD-10-CM | POA: Diagnosis not present

## 2020-12-20 DIAGNOSIS — D689 Coagulation defect, unspecified: Secondary | ICD-10-CM | POA: Diagnosis not present

## 2020-12-20 DIAGNOSIS — D631 Anemia in chronic kidney disease: Secondary | ICD-10-CM | POA: Diagnosis not present

## 2020-12-20 DIAGNOSIS — N2581 Secondary hyperparathyroidism of renal origin: Secondary | ICD-10-CM | POA: Diagnosis not present

## 2020-12-23 DIAGNOSIS — N2581 Secondary hyperparathyroidism of renal origin: Secondary | ICD-10-CM | POA: Diagnosis not present

## 2020-12-23 DIAGNOSIS — D689 Coagulation defect, unspecified: Secondary | ICD-10-CM | POA: Diagnosis not present

## 2020-12-23 DIAGNOSIS — Z992 Dependence on renal dialysis: Secondary | ICD-10-CM | POA: Diagnosis not present

## 2020-12-23 DIAGNOSIS — N186 End stage renal disease: Secondary | ICD-10-CM | POA: Diagnosis not present

## 2020-12-25 DIAGNOSIS — D689 Coagulation defect, unspecified: Secondary | ICD-10-CM | POA: Diagnosis not present

## 2020-12-25 DIAGNOSIS — Z992 Dependence on renal dialysis: Secondary | ICD-10-CM | POA: Diagnosis not present

## 2020-12-25 DIAGNOSIS — N186 End stage renal disease: Secondary | ICD-10-CM | POA: Diagnosis not present

## 2020-12-25 DIAGNOSIS — N2581 Secondary hyperparathyroidism of renal origin: Secondary | ICD-10-CM | POA: Diagnosis not present

## 2020-12-27 DIAGNOSIS — N2581 Secondary hyperparathyroidism of renal origin: Secondary | ICD-10-CM | POA: Diagnosis not present

## 2020-12-27 DIAGNOSIS — D689 Coagulation defect, unspecified: Secondary | ICD-10-CM | POA: Diagnosis not present

## 2020-12-27 DIAGNOSIS — Z992 Dependence on renal dialysis: Secondary | ICD-10-CM | POA: Diagnosis not present

## 2020-12-27 DIAGNOSIS — N186 End stage renal disease: Secondary | ICD-10-CM | POA: Diagnosis not present

## 2020-12-30 DIAGNOSIS — N186 End stage renal disease: Secondary | ICD-10-CM | POA: Diagnosis not present

## 2020-12-30 DIAGNOSIS — N2581 Secondary hyperparathyroidism of renal origin: Secondary | ICD-10-CM | POA: Diagnosis not present

## 2020-12-30 DIAGNOSIS — Z992 Dependence on renal dialysis: Secondary | ICD-10-CM | POA: Diagnosis not present

## 2020-12-30 DIAGNOSIS — D689 Coagulation defect, unspecified: Secondary | ICD-10-CM | POA: Diagnosis not present

## 2020-12-30 DIAGNOSIS — D509 Iron deficiency anemia, unspecified: Secondary | ICD-10-CM | POA: Diagnosis not present

## 2021-01-01 DIAGNOSIS — N186 End stage renal disease: Secondary | ICD-10-CM | POA: Diagnosis not present

## 2021-01-01 DIAGNOSIS — Z992 Dependence on renal dialysis: Secondary | ICD-10-CM | POA: Diagnosis not present

## 2021-01-01 DIAGNOSIS — N2581 Secondary hyperparathyroidism of renal origin: Secondary | ICD-10-CM | POA: Diagnosis not present

## 2021-01-01 DIAGNOSIS — D689 Coagulation defect, unspecified: Secondary | ICD-10-CM | POA: Diagnosis not present

## 2021-01-01 DIAGNOSIS — D509 Iron deficiency anemia, unspecified: Secondary | ICD-10-CM | POA: Diagnosis not present

## 2021-01-03 DIAGNOSIS — N186 End stage renal disease: Secondary | ICD-10-CM | POA: Diagnosis not present

## 2021-01-03 DIAGNOSIS — N2581 Secondary hyperparathyroidism of renal origin: Secondary | ICD-10-CM | POA: Diagnosis not present

## 2021-01-03 DIAGNOSIS — D509 Iron deficiency anemia, unspecified: Secondary | ICD-10-CM | POA: Diagnosis not present

## 2021-01-03 DIAGNOSIS — D689 Coagulation defect, unspecified: Secondary | ICD-10-CM | POA: Diagnosis not present

## 2021-01-03 DIAGNOSIS — Z992 Dependence on renal dialysis: Secondary | ICD-10-CM | POA: Diagnosis not present

## 2021-01-06 DIAGNOSIS — N2581 Secondary hyperparathyroidism of renal origin: Secondary | ICD-10-CM | POA: Diagnosis not present

## 2021-01-06 DIAGNOSIS — D689 Coagulation defect, unspecified: Secondary | ICD-10-CM | POA: Diagnosis not present

## 2021-01-06 DIAGNOSIS — N186 End stage renal disease: Secondary | ICD-10-CM | POA: Diagnosis not present

## 2021-01-06 DIAGNOSIS — Z992 Dependence on renal dialysis: Secondary | ICD-10-CM | POA: Diagnosis not present

## 2021-01-07 DIAGNOSIS — H34211 Partial retinal artery occlusion, right eye: Secondary | ICD-10-CM | POA: Diagnosis not present

## 2021-01-07 DIAGNOSIS — H2513 Age-related nuclear cataract, bilateral: Secondary | ICD-10-CM | POA: Diagnosis not present

## 2021-01-07 DIAGNOSIS — H35033 Hypertensive retinopathy, bilateral: Secondary | ICD-10-CM | POA: Diagnosis not present

## 2021-01-07 DIAGNOSIS — E119 Type 2 diabetes mellitus without complications: Secondary | ICD-10-CM | POA: Diagnosis not present

## 2021-01-07 DIAGNOSIS — H5703 Miosis: Secondary | ICD-10-CM | POA: Diagnosis not present

## 2021-01-07 LAB — HM DIABETES EYE EXAM

## 2021-01-08 DIAGNOSIS — D689 Coagulation defect, unspecified: Secondary | ICD-10-CM | POA: Diagnosis not present

## 2021-01-08 DIAGNOSIS — N2581 Secondary hyperparathyroidism of renal origin: Secondary | ICD-10-CM | POA: Diagnosis not present

## 2021-01-08 DIAGNOSIS — N186 End stage renal disease: Secondary | ICD-10-CM | POA: Diagnosis not present

## 2021-01-08 DIAGNOSIS — Z992 Dependence on renal dialysis: Secondary | ICD-10-CM | POA: Diagnosis not present

## 2021-01-08 DIAGNOSIS — E1122 Type 2 diabetes mellitus with diabetic chronic kidney disease: Secondary | ICD-10-CM | POA: Diagnosis not present

## 2021-01-10 DIAGNOSIS — Z992 Dependence on renal dialysis: Secondary | ICD-10-CM | POA: Diagnosis not present

## 2021-01-10 DIAGNOSIS — D509 Iron deficiency anemia, unspecified: Secondary | ICD-10-CM | POA: Diagnosis not present

## 2021-01-10 DIAGNOSIS — D689 Coagulation defect, unspecified: Secondary | ICD-10-CM | POA: Diagnosis not present

## 2021-01-10 DIAGNOSIS — N186 End stage renal disease: Secondary | ICD-10-CM | POA: Diagnosis not present

## 2021-01-10 DIAGNOSIS — E1129 Type 2 diabetes mellitus with other diabetic kidney complication: Secondary | ICD-10-CM | POA: Diagnosis not present

## 2021-01-10 DIAGNOSIS — N2581 Secondary hyperparathyroidism of renal origin: Secondary | ICD-10-CM | POA: Diagnosis not present

## 2021-01-10 DIAGNOSIS — D631 Anemia in chronic kidney disease: Secondary | ICD-10-CM | POA: Diagnosis not present

## 2021-01-13 DIAGNOSIS — Z992 Dependence on renal dialysis: Secondary | ICD-10-CM | POA: Diagnosis not present

## 2021-01-13 DIAGNOSIS — D689 Coagulation defect, unspecified: Secondary | ICD-10-CM | POA: Diagnosis not present

## 2021-01-13 DIAGNOSIS — N186 End stage renal disease: Secondary | ICD-10-CM | POA: Diagnosis not present

## 2021-01-13 DIAGNOSIS — D631 Anemia in chronic kidney disease: Secondary | ICD-10-CM | POA: Diagnosis not present

## 2021-01-13 DIAGNOSIS — E1129 Type 2 diabetes mellitus with other diabetic kidney complication: Secondary | ICD-10-CM | POA: Diagnosis not present

## 2021-01-13 DIAGNOSIS — D509 Iron deficiency anemia, unspecified: Secondary | ICD-10-CM | POA: Diagnosis not present

## 2021-01-13 DIAGNOSIS — N2581 Secondary hyperparathyroidism of renal origin: Secondary | ICD-10-CM | POA: Diagnosis not present

## 2021-01-15 DIAGNOSIS — E1129 Type 2 diabetes mellitus with other diabetic kidney complication: Secondary | ICD-10-CM | POA: Diagnosis not present

## 2021-01-15 DIAGNOSIS — N2581 Secondary hyperparathyroidism of renal origin: Secondary | ICD-10-CM | POA: Diagnosis not present

## 2021-01-15 DIAGNOSIS — D509 Iron deficiency anemia, unspecified: Secondary | ICD-10-CM | POA: Diagnosis not present

## 2021-01-15 DIAGNOSIS — N186 End stage renal disease: Secondary | ICD-10-CM | POA: Diagnosis not present

## 2021-01-15 DIAGNOSIS — Z992 Dependence on renal dialysis: Secondary | ICD-10-CM | POA: Diagnosis not present

## 2021-01-15 DIAGNOSIS — D631 Anemia in chronic kidney disease: Secondary | ICD-10-CM | POA: Diagnosis not present

## 2021-01-15 DIAGNOSIS — D689 Coagulation defect, unspecified: Secondary | ICD-10-CM | POA: Diagnosis not present

## 2021-01-17 DIAGNOSIS — N186 End stage renal disease: Secondary | ICD-10-CM | POA: Diagnosis not present

## 2021-01-17 DIAGNOSIS — D509 Iron deficiency anemia, unspecified: Secondary | ICD-10-CM | POA: Diagnosis not present

## 2021-01-17 DIAGNOSIS — N2581 Secondary hyperparathyroidism of renal origin: Secondary | ICD-10-CM | POA: Diagnosis not present

## 2021-01-17 DIAGNOSIS — E1129 Type 2 diabetes mellitus with other diabetic kidney complication: Secondary | ICD-10-CM | POA: Diagnosis not present

## 2021-01-17 DIAGNOSIS — D689 Coagulation defect, unspecified: Secondary | ICD-10-CM | POA: Diagnosis not present

## 2021-01-17 DIAGNOSIS — Z992 Dependence on renal dialysis: Secondary | ICD-10-CM | POA: Diagnosis not present

## 2021-01-17 DIAGNOSIS — D631 Anemia in chronic kidney disease: Secondary | ICD-10-CM | POA: Diagnosis not present

## 2021-01-20 DIAGNOSIS — N2581 Secondary hyperparathyroidism of renal origin: Secondary | ICD-10-CM | POA: Diagnosis not present

## 2021-01-20 DIAGNOSIS — Z992 Dependence on renal dialysis: Secondary | ICD-10-CM | POA: Diagnosis not present

## 2021-01-20 DIAGNOSIS — D689 Coagulation defect, unspecified: Secondary | ICD-10-CM | POA: Diagnosis not present

## 2021-01-20 DIAGNOSIS — D509 Iron deficiency anemia, unspecified: Secondary | ICD-10-CM | POA: Diagnosis not present

## 2021-01-20 DIAGNOSIS — E1129 Type 2 diabetes mellitus with other diabetic kidney complication: Secondary | ICD-10-CM | POA: Diagnosis not present

## 2021-01-20 DIAGNOSIS — D631 Anemia in chronic kidney disease: Secondary | ICD-10-CM | POA: Diagnosis not present

## 2021-01-20 DIAGNOSIS — N186 End stage renal disease: Secondary | ICD-10-CM | POA: Diagnosis not present

## 2021-01-22 DIAGNOSIS — D689 Coagulation defect, unspecified: Secondary | ICD-10-CM | POA: Diagnosis not present

## 2021-01-22 DIAGNOSIS — N186 End stage renal disease: Secondary | ICD-10-CM | POA: Diagnosis not present

## 2021-01-22 DIAGNOSIS — Z992 Dependence on renal dialysis: Secondary | ICD-10-CM | POA: Diagnosis not present

## 2021-01-22 DIAGNOSIS — E1129 Type 2 diabetes mellitus with other diabetic kidney complication: Secondary | ICD-10-CM | POA: Diagnosis not present

## 2021-01-22 DIAGNOSIS — D509 Iron deficiency anemia, unspecified: Secondary | ICD-10-CM | POA: Diagnosis not present

## 2021-01-22 DIAGNOSIS — D631 Anemia in chronic kidney disease: Secondary | ICD-10-CM | POA: Diagnosis not present

## 2021-01-22 DIAGNOSIS — N2581 Secondary hyperparathyroidism of renal origin: Secondary | ICD-10-CM | POA: Diagnosis not present

## 2021-01-23 ENCOUNTER — Encounter: Payer: Self-pay | Admitting: Internal Medicine

## 2021-01-24 DIAGNOSIS — D509 Iron deficiency anemia, unspecified: Secondary | ICD-10-CM | POA: Diagnosis not present

## 2021-01-24 DIAGNOSIS — E1129 Type 2 diabetes mellitus with other diabetic kidney complication: Secondary | ICD-10-CM | POA: Diagnosis not present

## 2021-01-24 DIAGNOSIS — Z992 Dependence on renal dialysis: Secondary | ICD-10-CM | POA: Diagnosis not present

## 2021-01-24 DIAGNOSIS — N186 End stage renal disease: Secondary | ICD-10-CM | POA: Diagnosis not present

## 2021-01-24 DIAGNOSIS — D631 Anemia in chronic kidney disease: Secondary | ICD-10-CM | POA: Diagnosis not present

## 2021-01-24 DIAGNOSIS — D689 Coagulation defect, unspecified: Secondary | ICD-10-CM | POA: Diagnosis not present

## 2021-01-24 DIAGNOSIS — N2581 Secondary hyperparathyroidism of renal origin: Secondary | ICD-10-CM | POA: Diagnosis not present

## 2021-01-27 DIAGNOSIS — D509 Iron deficiency anemia, unspecified: Secondary | ICD-10-CM | POA: Diagnosis not present

## 2021-01-27 DIAGNOSIS — N186 End stage renal disease: Secondary | ICD-10-CM | POA: Diagnosis not present

## 2021-01-27 DIAGNOSIS — N2581 Secondary hyperparathyroidism of renal origin: Secondary | ICD-10-CM | POA: Diagnosis not present

## 2021-01-27 DIAGNOSIS — Z992 Dependence on renal dialysis: Secondary | ICD-10-CM | POA: Diagnosis not present

## 2021-01-27 DIAGNOSIS — E1129 Type 2 diabetes mellitus with other diabetic kidney complication: Secondary | ICD-10-CM | POA: Diagnosis not present

## 2021-01-27 DIAGNOSIS — D631 Anemia in chronic kidney disease: Secondary | ICD-10-CM | POA: Diagnosis not present

## 2021-01-27 DIAGNOSIS — D689 Coagulation defect, unspecified: Secondary | ICD-10-CM | POA: Diagnosis not present

## 2021-01-29 DIAGNOSIS — N186 End stage renal disease: Secondary | ICD-10-CM | POA: Diagnosis not present

## 2021-01-29 DIAGNOSIS — D509 Iron deficiency anemia, unspecified: Secondary | ICD-10-CM | POA: Diagnosis not present

## 2021-01-29 DIAGNOSIS — Z992 Dependence on renal dialysis: Secondary | ICD-10-CM | POA: Diagnosis not present

## 2021-01-29 DIAGNOSIS — E1129 Type 2 diabetes mellitus with other diabetic kidney complication: Secondary | ICD-10-CM | POA: Diagnosis not present

## 2021-01-29 DIAGNOSIS — D631 Anemia in chronic kidney disease: Secondary | ICD-10-CM | POA: Diagnosis not present

## 2021-01-29 DIAGNOSIS — N2581 Secondary hyperparathyroidism of renal origin: Secondary | ICD-10-CM | POA: Diagnosis not present

## 2021-01-29 DIAGNOSIS — D689 Coagulation defect, unspecified: Secondary | ICD-10-CM | POA: Diagnosis not present

## 2021-01-31 DIAGNOSIS — E1129 Type 2 diabetes mellitus with other diabetic kidney complication: Secondary | ICD-10-CM | POA: Diagnosis not present

## 2021-01-31 DIAGNOSIS — N186 End stage renal disease: Secondary | ICD-10-CM | POA: Diagnosis not present

## 2021-01-31 DIAGNOSIS — D631 Anemia in chronic kidney disease: Secondary | ICD-10-CM | POA: Diagnosis not present

## 2021-01-31 DIAGNOSIS — Z992 Dependence on renal dialysis: Secondary | ICD-10-CM | POA: Diagnosis not present

## 2021-01-31 DIAGNOSIS — D509 Iron deficiency anemia, unspecified: Secondary | ICD-10-CM | POA: Diagnosis not present

## 2021-01-31 DIAGNOSIS — D689 Coagulation defect, unspecified: Secondary | ICD-10-CM | POA: Diagnosis not present

## 2021-01-31 DIAGNOSIS — N2581 Secondary hyperparathyroidism of renal origin: Secondary | ICD-10-CM | POA: Diagnosis not present

## 2021-02-03 DIAGNOSIS — N186 End stage renal disease: Secondary | ICD-10-CM | POA: Diagnosis not present

## 2021-02-03 DIAGNOSIS — D509 Iron deficiency anemia, unspecified: Secondary | ICD-10-CM | POA: Diagnosis not present

## 2021-02-03 DIAGNOSIS — D631 Anemia in chronic kidney disease: Secondary | ICD-10-CM | POA: Diagnosis not present

## 2021-02-03 DIAGNOSIS — Z992 Dependence on renal dialysis: Secondary | ICD-10-CM | POA: Diagnosis not present

## 2021-02-03 DIAGNOSIS — E1129 Type 2 diabetes mellitus with other diabetic kidney complication: Secondary | ICD-10-CM | POA: Diagnosis not present

## 2021-02-03 DIAGNOSIS — D689 Coagulation defect, unspecified: Secondary | ICD-10-CM | POA: Diagnosis not present

## 2021-02-03 DIAGNOSIS — N2581 Secondary hyperparathyroidism of renal origin: Secondary | ICD-10-CM | POA: Diagnosis not present

## 2021-02-05 DIAGNOSIS — N2581 Secondary hyperparathyroidism of renal origin: Secondary | ICD-10-CM | POA: Diagnosis not present

## 2021-02-05 DIAGNOSIS — D689 Coagulation defect, unspecified: Secondary | ICD-10-CM | POA: Diagnosis not present

## 2021-02-05 DIAGNOSIS — N186 End stage renal disease: Secondary | ICD-10-CM | POA: Diagnosis not present

## 2021-02-05 DIAGNOSIS — E1129 Type 2 diabetes mellitus with other diabetic kidney complication: Secondary | ICD-10-CM | POA: Diagnosis not present

## 2021-02-05 DIAGNOSIS — Z992 Dependence on renal dialysis: Secondary | ICD-10-CM | POA: Diagnosis not present

## 2021-02-05 DIAGNOSIS — D509 Iron deficiency anemia, unspecified: Secondary | ICD-10-CM | POA: Diagnosis not present

## 2021-02-05 DIAGNOSIS — D631 Anemia in chronic kidney disease: Secondary | ICD-10-CM | POA: Diagnosis not present

## 2021-02-07 DIAGNOSIS — Z992 Dependence on renal dialysis: Secondary | ICD-10-CM | POA: Diagnosis not present

## 2021-02-07 DIAGNOSIS — D509 Iron deficiency anemia, unspecified: Secondary | ICD-10-CM | POA: Diagnosis not present

## 2021-02-07 DIAGNOSIS — N186 End stage renal disease: Secondary | ICD-10-CM | POA: Diagnosis not present

## 2021-02-07 DIAGNOSIS — E1129 Type 2 diabetes mellitus with other diabetic kidney complication: Secondary | ICD-10-CM | POA: Diagnosis not present

## 2021-02-07 DIAGNOSIS — D631 Anemia in chronic kidney disease: Secondary | ICD-10-CM | POA: Diagnosis not present

## 2021-02-07 DIAGNOSIS — D689 Coagulation defect, unspecified: Secondary | ICD-10-CM | POA: Diagnosis not present

## 2021-02-07 DIAGNOSIS — N2581 Secondary hyperparathyroidism of renal origin: Secondary | ICD-10-CM | POA: Diagnosis not present

## 2021-02-08 DIAGNOSIS — Z992 Dependence on renal dialysis: Secondary | ICD-10-CM | POA: Diagnosis not present

## 2021-02-08 DIAGNOSIS — E1122 Type 2 diabetes mellitus with diabetic chronic kidney disease: Secondary | ICD-10-CM | POA: Diagnosis not present

## 2021-02-08 DIAGNOSIS — N186 End stage renal disease: Secondary | ICD-10-CM | POA: Diagnosis not present

## 2021-02-10 DIAGNOSIS — Z992 Dependence on renal dialysis: Secondary | ICD-10-CM | POA: Diagnosis not present

## 2021-02-10 DIAGNOSIS — D509 Iron deficiency anemia, unspecified: Secondary | ICD-10-CM | POA: Diagnosis not present

## 2021-02-10 DIAGNOSIS — D689 Coagulation defect, unspecified: Secondary | ICD-10-CM | POA: Diagnosis not present

## 2021-02-10 DIAGNOSIS — D631 Anemia in chronic kidney disease: Secondary | ICD-10-CM | POA: Diagnosis not present

## 2021-02-10 DIAGNOSIS — N186 End stage renal disease: Secondary | ICD-10-CM | POA: Diagnosis not present

## 2021-02-10 DIAGNOSIS — N2581 Secondary hyperparathyroidism of renal origin: Secondary | ICD-10-CM | POA: Diagnosis not present

## 2021-02-12 DIAGNOSIS — D509 Iron deficiency anemia, unspecified: Secondary | ICD-10-CM | POA: Diagnosis not present

## 2021-02-12 DIAGNOSIS — D689 Coagulation defect, unspecified: Secondary | ICD-10-CM | POA: Diagnosis not present

## 2021-02-12 DIAGNOSIS — N2581 Secondary hyperparathyroidism of renal origin: Secondary | ICD-10-CM | POA: Diagnosis not present

## 2021-02-12 DIAGNOSIS — N186 End stage renal disease: Secondary | ICD-10-CM | POA: Diagnosis not present

## 2021-02-12 DIAGNOSIS — Z992 Dependence on renal dialysis: Secondary | ICD-10-CM | POA: Diagnosis not present

## 2021-02-12 DIAGNOSIS — D631 Anemia in chronic kidney disease: Secondary | ICD-10-CM | POA: Diagnosis not present

## 2021-02-14 DIAGNOSIS — D689 Coagulation defect, unspecified: Secondary | ICD-10-CM | POA: Diagnosis not present

## 2021-02-14 DIAGNOSIS — Z992 Dependence on renal dialysis: Secondary | ICD-10-CM | POA: Diagnosis not present

## 2021-02-14 DIAGNOSIS — D509 Iron deficiency anemia, unspecified: Secondary | ICD-10-CM | POA: Diagnosis not present

## 2021-02-14 DIAGNOSIS — N186 End stage renal disease: Secondary | ICD-10-CM | POA: Diagnosis not present

## 2021-02-14 DIAGNOSIS — N2581 Secondary hyperparathyroidism of renal origin: Secondary | ICD-10-CM | POA: Diagnosis not present

## 2021-02-14 DIAGNOSIS — D631 Anemia in chronic kidney disease: Secondary | ICD-10-CM | POA: Diagnosis not present

## 2021-02-17 DIAGNOSIS — D509 Iron deficiency anemia, unspecified: Secondary | ICD-10-CM | POA: Diagnosis not present

## 2021-02-17 DIAGNOSIS — Z992 Dependence on renal dialysis: Secondary | ICD-10-CM | POA: Diagnosis not present

## 2021-02-17 DIAGNOSIS — D631 Anemia in chronic kidney disease: Secondary | ICD-10-CM | POA: Diagnosis not present

## 2021-02-17 DIAGNOSIS — N2581 Secondary hyperparathyroidism of renal origin: Secondary | ICD-10-CM | POA: Diagnosis not present

## 2021-02-17 DIAGNOSIS — D689 Coagulation defect, unspecified: Secondary | ICD-10-CM | POA: Diagnosis not present

## 2021-02-17 DIAGNOSIS — N186 End stage renal disease: Secondary | ICD-10-CM | POA: Diagnosis not present

## 2021-02-19 DIAGNOSIS — D631 Anemia in chronic kidney disease: Secondary | ICD-10-CM | POA: Diagnosis not present

## 2021-02-19 DIAGNOSIS — D689 Coagulation defect, unspecified: Secondary | ICD-10-CM | POA: Diagnosis not present

## 2021-02-19 DIAGNOSIS — D509 Iron deficiency anemia, unspecified: Secondary | ICD-10-CM | POA: Diagnosis not present

## 2021-02-19 DIAGNOSIS — N186 End stage renal disease: Secondary | ICD-10-CM | POA: Diagnosis not present

## 2021-02-19 DIAGNOSIS — Z992 Dependence on renal dialysis: Secondary | ICD-10-CM | POA: Diagnosis not present

## 2021-02-19 DIAGNOSIS — N2581 Secondary hyperparathyroidism of renal origin: Secondary | ICD-10-CM | POA: Diagnosis not present

## 2021-02-21 DIAGNOSIS — N186 End stage renal disease: Secondary | ICD-10-CM | POA: Diagnosis not present

## 2021-02-21 DIAGNOSIS — D509 Iron deficiency anemia, unspecified: Secondary | ICD-10-CM | POA: Diagnosis not present

## 2021-02-21 DIAGNOSIS — D689 Coagulation defect, unspecified: Secondary | ICD-10-CM | POA: Diagnosis not present

## 2021-02-21 DIAGNOSIS — Z992 Dependence on renal dialysis: Secondary | ICD-10-CM | POA: Diagnosis not present

## 2021-02-21 DIAGNOSIS — D631 Anemia in chronic kidney disease: Secondary | ICD-10-CM | POA: Diagnosis not present

## 2021-02-21 DIAGNOSIS — N2581 Secondary hyperparathyroidism of renal origin: Secondary | ICD-10-CM | POA: Diagnosis not present

## 2021-02-23 ENCOUNTER — Other Ambulatory Visit: Payer: Self-pay | Admitting: Internal Medicine

## 2021-02-23 NOTE — Telephone Encounter (Signed)
Please refill as per office routine med refill policy (all routine meds refilled for 3 mo or monthly per pt preference up to one year from last visit, then month to month grace period for 3 mo, then further med refills will have to be denied)  

## 2021-02-24 DIAGNOSIS — D509 Iron deficiency anemia, unspecified: Secondary | ICD-10-CM | POA: Diagnosis not present

## 2021-02-24 DIAGNOSIS — D631 Anemia in chronic kidney disease: Secondary | ICD-10-CM | POA: Diagnosis not present

## 2021-02-24 DIAGNOSIS — D689 Coagulation defect, unspecified: Secondary | ICD-10-CM | POA: Diagnosis not present

## 2021-02-24 DIAGNOSIS — Z992 Dependence on renal dialysis: Secondary | ICD-10-CM | POA: Diagnosis not present

## 2021-02-24 DIAGNOSIS — N186 End stage renal disease: Secondary | ICD-10-CM | POA: Diagnosis not present

## 2021-02-24 DIAGNOSIS — N2581 Secondary hyperparathyroidism of renal origin: Secondary | ICD-10-CM | POA: Diagnosis not present

## 2021-02-26 DIAGNOSIS — Z992 Dependence on renal dialysis: Secondary | ICD-10-CM | POA: Diagnosis not present

## 2021-02-26 DIAGNOSIS — D631 Anemia in chronic kidney disease: Secondary | ICD-10-CM | POA: Diagnosis not present

## 2021-02-26 DIAGNOSIS — D689 Coagulation defect, unspecified: Secondary | ICD-10-CM | POA: Diagnosis not present

## 2021-02-26 DIAGNOSIS — D509 Iron deficiency anemia, unspecified: Secondary | ICD-10-CM | POA: Diagnosis not present

## 2021-02-26 DIAGNOSIS — N186 End stage renal disease: Secondary | ICD-10-CM | POA: Diagnosis not present

## 2021-02-26 DIAGNOSIS — N2581 Secondary hyperparathyroidism of renal origin: Secondary | ICD-10-CM | POA: Diagnosis not present

## 2021-02-28 DIAGNOSIS — Z992 Dependence on renal dialysis: Secondary | ICD-10-CM | POA: Diagnosis not present

## 2021-02-28 DIAGNOSIS — N2581 Secondary hyperparathyroidism of renal origin: Secondary | ICD-10-CM | POA: Diagnosis not present

## 2021-02-28 DIAGNOSIS — D631 Anemia in chronic kidney disease: Secondary | ICD-10-CM | POA: Diagnosis not present

## 2021-02-28 DIAGNOSIS — D509 Iron deficiency anemia, unspecified: Secondary | ICD-10-CM | POA: Diagnosis not present

## 2021-02-28 DIAGNOSIS — N186 End stage renal disease: Secondary | ICD-10-CM | POA: Diagnosis not present

## 2021-02-28 DIAGNOSIS — D689 Coagulation defect, unspecified: Secondary | ICD-10-CM | POA: Diagnosis not present

## 2021-03-02 ENCOUNTER — Ambulatory Visit: Payer: Medicare Other | Admitting: Podiatry

## 2021-03-03 DIAGNOSIS — Z992 Dependence on renal dialysis: Secondary | ICD-10-CM | POA: Diagnosis not present

## 2021-03-03 DIAGNOSIS — D631 Anemia in chronic kidney disease: Secondary | ICD-10-CM | POA: Diagnosis not present

## 2021-03-03 DIAGNOSIS — D509 Iron deficiency anemia, unspecified: Secondary | ICD-10-CM | POA: Diagnosis not present

## 2021-03-03 DIAGNOSIS — D689 Coagulation defect, unspecified: Secondary | ICD-10-CM | POA: Diagnosis not present

## 2021-03-03 DIAGNOSIS — N186 End stage renal disease: Secondary | ICD-10-CM | POA: Diagnosis not present

## 2021-03-03 DIAGNOSIS — N2581 Secondary hyperparathyroidism of renal origin: Secondary | ICD-10-CM | POA: Diagnosis not present

## 2021-03-05 DIAGNOSIS — D631 Anemia in chronic kidney disease: Secondary | ICD-10-CM | POA: Diagnosis not present

## 2021-03-05 DIAGNOSIS — D689 Coagulation defect, unspecified: Secondary | ICD-10-CM | POA: Diagnosis not present

## 2021-03-05 DIAGNOSIS — N186 End stage renal disease: Secondary | ICD-10-CM | POA: Diagnosis not present

## 2021-03-05 DIAGNOSIS — D509 Iron deficiency anemia, unspecified: Secondary | ICD-10-CM | POA: Diagnosis not present

## 2021-03-05 DIAGNOSIS — N2581 Secondary hyperparathyroidism of renal origin: Secondary | ICD-10-CM | POA: Diagnosis not present

## 2021-03-05 DIAGNOSIS — Z992 Dependence on renal dialysis: Secondary | ICD-10-CM | POA: Diagnosis not present

## 2021-03-07 DIAGNOSIS — N186 End stage renal disease: Secondary | ICD-10-CM | POA: Diagnosis not present

## 2021-03-07 DIAGNOSIS — D631 Anemia in chronic kidney disease: Secondary | ICD-10-CM | POA: Diagnosis not present

## 2021-03-07 DIAGNOSIS — N2581 Secondary hyperparathyroidism of renal origin: Secondary | ICD-10-CM | POA: Diagnosis not present

## 2021-03-07 DIAGNOSIS — D689 Coagulation defect, unspecified: Secondary | ICD-10-CM | POA: Diagnosis not present

## 2021-03-07 DIAGNOSIS — D509 Iron deficiency anemia, unspecified: Secondary | ICD-10-CM | POA: Diagnosis not present

## 2021-03-07 DIAGNOSIS — Z992 Dependence on renal dialysis: Secondary | ICD-10-CM | POA: Diagnosis not present

## 2021-03-10 DIAGNOSIS — Z992 Dependence on renal dialysis: Secondary | ICD-10-CM | POA: Diagnosis not present

## 2021-03-10 DIAGNOSIS — N186 End stage renal disease: Secondary | ICD-10-CM | POA: Diagnosis not present

## 2021-03-10 DIAGNOSIS — D631 Anemia in chronic kidney disease: Secondary | ICD-10-CM | POA: Diagnosis not present

## 2021-03-10 DIAGNOSIS — N2581 Secondary hyperparathyroidism of renal origin: Secondary | ICD-10-CM | POA: Diagnosis not present

## 2021-03-10 DIAGNOSIS — D509 Iron deficiency anemia, unspecified: Secondary | ICD-10-CM | POA: Diagnosis not present

## 2021-03-10 DIAGNOSIS — D689 Coagulation defect, unspecified: Secondary | ICD-10-CM | POA: Diagnosis not present

## 2021-03-11 DIAGNOSIS — N186 End stage renal disease: Secondary | ICD-10-CM | POA: Diagnosis not present

## 2021-03-11 DIAGNOSIS — Z992 Dependence on renal dialysis: Secondary | ICD-10-CM | POA: Diagnosis not present

## 2021-03-11 DIAGNOSIS — E1122 Type 2 diabetes mellitus with diabetic chronic kidney disease: Secondary | ICD-10-CM | POA: Diagnosis not present

## 2021-03-12 DIAGNOSIS — Z992 Dependence on renal dialysis: Secondary | ICD-10-CM | POA: Diagnosis not present

## 2021-03-12 DIAGNOSIS — E1129 Type 2 diabetes mellitus with other diabetic kidney complication: Secondary | ICD-10-CM | POA: Diagnosis not present

## 2021-03-12 DIAGNOSIS — D631 Anemia in chronic kidney disease: Secondary | ICD-10-CM | POA: Diagnosis not present

## 2021-03-12 DIAGNOSIS — D509 Iron deficiency anemia, unspecified: Secondary | ICD-10-CM | POA: Diagnosis not present

## 2021-03-12 DIAGNOSIS — D689 Coagulation defect, unspecified: Secondary | ICD-10-CM | POA: Diagnosis not present

## 2021-03-12 DIAGNOSIS — N2581 Secondary hyperparathyroidism of renal origin: Secondary | ICD-10-CM | POA: Diagnosis not present

## 2021-03-12 DIAGNOSIS — N186 End stage renal disease: Secondary | ICD-10-CM | POA: Diagnosis not present

## 2021-03-14 DIAGNOSIS — N186 End stage renal disease: Secondary | ICD-10-CM | POA: Diagnosis not present

## 2021-03-14 DIAGNOSIS — D689 Coagulation defect, unspecified: Secondary | ICD-10-CM | POA: Diagnosis not present

## 2021-03-14 DIAGNOSIS — D509 Iron deficiency anemia, unspecified: Secondary | ICD-10-CM | POA: Diagnosis not present

## 2021-03-14 DIAGNOSIS — Z992 Dependence on renal dialysis: Secondary | ICD-10-CM | POA: Diagnosis not present

## 2021-03-14 DIAGNOSIS — D631 Anemia in chronic kidney disease: Secondary | ICD-10-CM | POA: Diagnosis not present

## 2021-03-14 DIAGNOSIS — E1129 Type 2 diabetes mellitus with other diabetic kidney complication: Secondary | ICD-10-CM | POA: Diagnosis not present

## 2021-03-14 DIAGNOSIS — N2581 Secondary hyperparathyroidism of renal origin: Secondary | ICD-10-CM | POA: Diagnosis not present

## 2021-03-17 DIAGNOSIS — D631 Anemia in chronic kidney disease: Secondary | ICD-10-CM | POA: Diagnosis not present

## 2021-03-17 DIAGNOSIS — D509 Iron deficiency anemia, unspecified: Secondary | ICD-10-CM | POA: Diagnosis not present

## 2021-03-17 DIAGNOSIS — N186 End stage renal disease: Secondary | ICD-10-CM | POA: Diagnosis not present

## 2021-03-17 DIAGNOSIS — N2581 Secondary hyperparathyroidism of renal origin: Secondary | ICD-10-CM | POA: Diagnosis not present

## 2021-03-17 DIAGNOSIS — E1129 Type 2 diabetes mellitus with other diabetic kidney complication: Secondary | ICD-10-CM | POA: Diagnosis not present

## 2021-03-17 DIAGNOSIS — Z992 Dependence on renal dialysis: Secondary | ICD-10-CM | POA: Diagnosis not present

## 2021-03-17 DIAGNOSIS — D689 Coagulation defect, unspecified: Secondary | ICD-10-CM | POA: Diagnosis not present

## 2021-03-18 ENCOUNTER — Ambulatory Visit: Payer: Medicare Other | Admitting: Podiatry

## 2021-03-18 ENCOUNTER — Encounter: Payer: Self-pay | Admitting: Podiatry

## 2021-03-18 ENCOUNTER — Other Ambulatory Visit: Payer: Self-pay

## 2021-03-18 DIAGNOSIS — M79676 Pain in unspecified toe(s): Secondary | ICD-10-CM

## 2021-03-18 DIAGNOSIS — B351 Tinea unguium: Secondary | ICD-10-CM | POA: Diagnosis not present

## 2021-03-18 DIAGNOSIS — Q828 Other specified congenital malformations of skin: Secondary | ICD-10-CM

## 2021-03-18 DIAGNOSIS — E1142 Type 2 diabetes mellitus with diabetic polyneuropathy: Secondary | ICD-10-CM

## 2021-03-18 NOTE — Progress Notes (Signed)
This patient returns to my office for at risk foot care.  This patient requires this care by a professional since this patient will be at risk due to having  ESRD, CKD and diabetes.  This patient is unable to cut nails himself since the patient cannot reach his nails.These nails are painful walking and wearing shoes.  This patient presents for at risk foot care today.  General Appearance  Alert, conversant and in no acute stress.  Vascular  Dorsalis pedis and posterior tibial  pulses are palpable  bilaterally.  Capillary return is within normal limits  bilaterally. Temperature is within normal limits  bilaterally.  Neurologic  Senn-Weinstein monofilament wire test diminished   bilaterally. Muscle power within normal limits bilaterally.  Nails Thick disfigured discolored nails with subungual debris  from hallux to fifth toes bilaterally. No evidence of bacterial infection or drainage bilaterally.  Orthopedic  No limitations of motion  feet .  No crepitus or effusions noted.  No bony pathology or digital deformities noted.  Skin  normotropic skin  noted bilaterally.  No signs of infections or ulcers noted.   Porokeratosis sub 4  B/L symptomatic.  Onychomycosis  Pain in right toes  Pain in left toes  Consent was obtained for treatment procedures.   Mechanical debridement of nails 1-5  bilaterally performed with a nail nipper.  Filed with dremel without incident.  Debride porokeratosis with # 15 blade.   Return office visit   9 weeks                  Told patient to return for periodic foot care and evaluation due to potential at risk complications.   Gardiner Barefoot DPM

## 2021-03-19 DIAGNOSIS — N2581 Secondary hyperparathyroidism of renal origin: Secondary | ICD-10-CM | POA: Diagnosis not present

## 2021-03-19 DIAGNOSIS — D509 Iron deficiency anemia, unspecified: Secondary | ICD-10-CM | POA: Diagnosis not present

## 2021-03-19 DIAGNOSIS — Z992 Dependence on renal dialysis: Secondary | ICD-10-CM | POA: Diagnosis not present

## 2021-03-19 DIAGNOSIS — D689 Coagulation defect, unspecified: Secondary | ICD-10-CM | POA: Diagnosis not present

## 2021-03-19 DIAGNOSIS — E1129 Type 2 diabetes mellitus with other diabetic kidney complication: Secondary | ICD-10-CM | POA: Diagnosis not present

## 2021-03-19 DIAGNOSIS — D631 Anemia in chronic kidney disease: Secondary | ICD-10-CM | POA: Diagnosis not present

## 2021-03-19 DIAGNOSIS — N186 End stage renal disease: Secondary | ICD-10-CM | POA: Diagnosis not present

## 2021-03-21 ENCOUNTER — Other Ambulatory Visit: Payer: Self-pay | Admitting: Internal Medicine

## 2021-03-21 DIAGNOSIS — Z992 Dependence on renal dialysis: Secondary | ICD-10-CM | POA: Diagnosis not present

## 2021-03-21 DIAGNOSIS — D631 Anemia in chronic kidney disease: Secondary | ICD-10-CM | POA: Diagnosis not present

## 2021-03-21 DIAGNOSIS — E1129 Type 2 diabetes mellitus with other diabetic kidney complication: Secondary | ICD-10-CM | POA: Diagnosis not present

## 2021-03-21 DIAGNOSIS — D689 Coagulation defect, unspecified: Secondary | ICD-10-CM | POA: Diagnosis not present

## 2021-03-21 DIAGNOSIS — D509 Iron deficiency anemia, unspecified: Secondary | ICD-10-CM | POA: Diagnosis not present

## 2021-03-21 DIAGNOSIS — N186 End stage renal disease: Secondary | ICD-10-CM | POA: Diagnosis not present

## 2021-03-21 DIAGNOSIS — N2581 Secondary hyperparathyroidism of renal origin: Secondary | ICD-10-CM | POA: Diagnosis not present

## 2021-03-21 NOTE — Telephone Encounter (Signed)
Please refill as per office routine med refill policy (all routine meds to be refilled for 3 mo or monthly (per pt preference) up to one year from last visit, then month to month grace period for 3 mo, then further med refills will have to be denied) ? ?

## 2021-03-24 DIAGNOSIS — E1129 Type 2 diabetes mellitus with other diabetic kidney complication: Secondary | ICD-10-CM | POA: Diagnosis not present

## 2021-03-24 DIAGNOSIS — N186 End stage renal disease: Secondary | ICD-10-CM | POA: Diagnosis not present

## 2021-03-24 DIAGNOSIS — N2581 Secondary hyperparathyroidism of renal origin: Secondary | ICD-10-CM | POA: Diagnosis not present

## 2021-03-24 DIAGNOSIS — Z992 Dependence on renal dialysis: Secondary | ICD-10-CM | POA: Diagnosis not present

## 2021-03-24 DIAGNOSIS — D689 Coagulation defect, unspecified: Secondary | ICD-10-CM | POA: Diagnosis not present

## 2021-03-24 DIAGNOSIS — D631 Anemia in chronic kidney disease: Secondary | ICD-10-CM | POA: Diagnosis not present

## 2021-03-24 DIAGNOSIS — D509 Iron deficiency anemia, unspecified: Secondary | ICD-10-CM | POA: Diagnosis not present

## 2021-03-26 DIAGNOSIS — D689 Coagulation defect, unspecified: Secondary | ICD-10-CM | POA: Diagnosis not present

## 2021-03-26 DIAGNOSIS — E1129 Type 2 diabetes mellitus with other diabetic kidney complication: Secondary | ICD-10-CM | POA: Diagnosis not present

## 2021-03-26 DIAGNOSIS — D509 Iron deficiency anemia, unspecified: Secondary | ICD-10-CM | POA: Diagnosis not present

## 2021-03-26 DIAGNOSIS — N2581 Secondary hyperparathyroidism of renal origin: Secondary | ICD-10-CM | POA: Diagnosis not present

## 2021-03-26 DIAGNOSIS — D631 Anemia in chronic kidney disease: Secondary | ICD-10-CM | POA: Diagnosis not present

## 2021-03-26 DIAGNOSIS — Z992 Dependence on renal dialysis: Secondary | ICD-10-CM | POA: Diagnosis not present

## 2021-03-26 DIAGNOSIS — N186 End stage renal disease: Secondary | ICD-10-CM | POA: Diagnosis not present

## 2021-03-28 DIAGNOSIS — Z992 Dependence on renal dialysis: Secondary | ICD-10-CM | POA: Diagnosis not present

## 2021-03-28 DIAGNOSIS — E1129 Type 2 diabetes mellitus with other diabetic kidney complication: Secondary | ICD-10-CM | POA: Diagnosis not present

## 2021-03-28 DIAGNOSIS — D509 Iron deficiency anemia, unspecified: Secondary | ICD-10-CM | POA: Diagnosis not present

## 2021-03-28 DIAGNOSIS — D631 Anemia in chronic kidney disease: Secondary | ICD-10-CM | POA: Diagnosis not present

## 2021-03-28 DIAGNOSIS — N2581 Secondary hyperparathyroidism of renal origin: Secondary | ICD-10-CM | POA: Diagnosis not present

## 2021-03-28 DIAGNOSIS — D689 Coagulation defect, unspecified: Secondary | ICD-10-CM | POA: Diagnosis not present

## 2021-03-28 DIAGNOSIS — N186 End stage renal disease: Secondary | ICD-10-CM | POA: Diagnosis not present

## 2021-03-31 ENCOUNTER — Other Ambulatory Visit: Payer: Self-pay

## 2021-03-31 DIAGNOSIS — Z992 Dependence on renal dialysis: Secondary | ICD-10-CM | POA: Diagnosis not present

## 2021-03-31 DIAGNOSIS — N2581 Secondary hyperparathyroidism of renal origin: Secondary | ICD-10-CM | POA: Diagnosis not present

## 2021-03-31 DIAGNOSIS — N186 End stage renal disease: Secondary | ICD-10-CM | POA: Diagnosis not present

## 2021-03-31 DIAGNOSIS — D509 Iron deficiency anemia, unspecified: Secondary | ICD-10-CM | POA: Diagnosis not present

## 2021-03-31 DIAGNOSIS — D631 Anemia in chronic kidney disease: Secondary | ICD-10-CM | POA: Diagnosis not present

## 2021-03-31 DIAGNOSIS — E1129 Type 2 diabetes mellitus with other diabetic kidney complication: Secondary | ICD-10-CM | POA: Diagnosis not present

## 2021-03-31 DIAGNOSIS — D689 Coagulation defect, unspecified: Secondary | ICD-10-CM | POA: Diagnosis not present

## 2021-04-01 ENCOUNTER — Ambulatory Visit (INDEPENDENT_AMBULATORY_CARE_PROVIDER_SITE_OTHER): Payer: Medicare Other | Admitting: Internal Medicine

## 2021-04-01 ENCOUNTER — Telehealth: Payer: Self-pay

## 2021-04-01 ENCOUNTER — Encounter: Payer: Self-pay | Admitting: Internal Medicine

## 2021-04-01 VITALS — BP 138/70 | HR 80 | Temp 98.3°F | Ht 72.0 in | Wt 169.0 lb

## 2021-04-01 DIAGNOSIS — E782 Mixed hyperlipidemia: Secondary | ICD-10-CM

## 2021-04-01 DIAGNOSIS — E1129 Type 2 diabetes mellitus with other diabetic kidney complication: Secondary | ICD-10-CM | POA: Diagnosis not present

## 2021-04-01 DIAGNOSIS — G309 Alzheimer's disease, unspecified: Secondary | ICD-10-CM | POA: Diagnosis not present

## 2021-04-01 DIAGNOSIS — R778 Other specified abnormalities of plasma proteins: Secondary | ICD-10-CM | POA: Diagnosis not present

## 2021-04-01 DIAGNOSIS — I10 Essential (primary) hypertension: Secondary | ICD-10-CM

## 2021-04-01 DIAGNOSIS — F0280 Dementia in other diseases classified elsewhere without behavioral disturbance: Secondary | ICD-10-CM

## 2021-04-01 DIAGNOSIS — N186 End stage renal disease: Secondary | ICD-10-CM

## 2021-04-01 DIAGNOSIS — Z23 Encounter for immunization: Secondary | ICD-10-CM

## 2021-04-01 LAB — HEPATIC FUNCTION PANEL
ALT: 12 U/L (ref 0–53)
AST: 16 U/L (ref 0–37)
Albumin: 4.4 g/dL (ref 3.5–5.2)
Alkaline Phosphatase: 57 U/L (ref 39–117)
Bilirubin, Direct: 0.1 mg/dL (ref 0.0–0.3)
Total Bilirubin: 0.5 mg/dL (ref 0.2–1.2)
Total Protein: 9 g/dL — ABNORMAL HIGH (ref 6.0–8.3)

## 2021-04-01 LAB — BASIC METABOLIC PANEL
BUN: 35 mg/dL — ABNORMAL HIGH (ref 6–23)
CO2: 33 mEq/L — ABNORMAL HIGH (ref 19–32)
Calcium: 9.3 mg/dL (ref 8.4–10.5)
Chloride: 92 mEq/L — ABNORMAL LOW (ref 96–112)
Creatinine, Ser: 7.87 mg/dL (ref 0.40–1.50)
GFR: 6.83 mL/min — CL (ref >60.00)
Glucose, Bld: 68 mg/dL — ABNORMAL LOW (ref 70–99)
Potassium: 4.9 mEq/L (ref 3.5–5.1)
Sodium: 136 mEq/L (ref 135–145)

## 2021-04-01 LAB — LIPID PANEL
Cholesterol: 145 mg/dL (ref 0–200)
HDL: 45.5 mg/dL (ref >39.00)
LDL Cholesterol: 83 mg/dL (ref 0–99)
NonHDL: 99.44
Total CHOL/HDL Ratio: 3
Triglycerides: 80 mg/dL (ref 0.0–149.0)
VLDL: 16 mg/dL (ref 0.0–40.0)

## 2021-04-01 LAB — HEMOGLOBIN A1C: Hgb A1c MFr Bld: 6 % (ref 4.6–6.5)

## 2021-04-01 NOTE — Telephone Encounter (Signed)
CRITICAL VALUE STICKER  CRITICAL VALUE: Creatinine 7.87    GFR: 6.83  RECEIVER (on-site recipient of call): Lovena Le A. Alroy Dust, CMA  DATE & TIME NOTIFIED: 04/01/21 2:57p  MESSENGER (representative from lab): Saa  MD NOTIFIED: yes  TIME OF NOTIFICATION: 2:59p  RESPONSE: waiting response

## 2021-04-01 NOTE — Progress Notes (Signed)
Patient ID: Christopher Lopez, male   DOB: September 17, 1960, 60 y.o.   MRN: [id number]        Chief Complaint: follow up HTN, HLD and hyperglycemia , and elevated tot protein       HPI:  Christopher Lopez is a 60 y.o. male here with brother (mother now on home o2) overall doing ok, Pt denies chest pain, increased sob or doe, wheezing, orthopnea, PND, increased LE swelling, palpitations, dizziness or syncope.   Pt denies polydipsia, polyuria, or new focal neuro s/s.   Pt denies fever, wt loss, night sweats, loss of appetite, or other constitutional symptoms  Tolerating new crestor well.  No new complaints  Did have mild elevated tot prot with last labs.     Needs SCAT form filled out for transportation  Wt Readings from Last 3 Encounters:  04/01/21 169 lb (76.7 kg)  09/26/20 163 lb (73.9 kg)  03/19/20 162 lb (73.5 kg)   BP Readings from Last 3 Encounters:  04/01/21 138/70  09/26/20 138/76  03/19/20 118/70         Past Medical History:  Diagnosis Date   Alcohol abuse 09/18/2011   Allergic rhinitis, cause unspecified 09/20/2011   Anemia, unspecified 09/18/2011   Arthritis    Childhood asthma 09/20/2011   CKD (chronic kidney disease) stage 5, GFR less than 15 ml/min (HCC)    Dialysis - T/Th/Sa   Colitis 09/18/2011   Dementia ([place]) 09/18/2011   Diabetes mellitus    Diverticulosis of colon without hemorrhage 02/27/2014   Foot ulcer ([place]) 09/18/2011   High cholesterol    HTN (hypertension) 09/18/2011   Hyperlipidemia 11/08/2011   Hypertension    Impaired glucose tolerance 09/18/2011   Pneumonia 09/18/2011   Pre-ulcerative corn or callous 11/20/2011   Stroke ([place])    left leg deficit   Type II or unspecified type diabetes mellitus without mention of complication, uncontrolled 09/20/2011   Wears glasses    Past Surgical History:  Procedure Laterality Date   AV FISTULA PLACEMENT Left 12/23/2017   Procedure: CREATION OF BASILIAC - CEPHALIC  ARTERIOVENOUS (AV) FISTULA  STAGE ONE;  Surgeon: Waynetta Sandy,  MD;  Location: [place];  Service: Vascular;  Laterality: Left;   AV FISTULA PLACEMENT Left 07/26/2018   Procedure: INSERTION OF ARTERIOVENOUS (AV) GORE-TEX GRAFT ARM;  Surgeon: Waynetta Sandy, MD;  Location: [place];  Service: Vascular;  Laterality: Left;   [place] Left 03/01/2018   Procedure: BASILIC VEIN TRANSPOSITION SECOND STAGE;  Surgeon: Waynetta Sandy, MD;  Location: [place];  Service: Vascular;  Laterality: Left;   INSERTION OF DIALYSIS CATHETER Right 12/23/2017   Procedure: INSERTION OF PALINDROME  DIALYSIS CATHETER;  Surgeon: Waynetta Sandy, MD;  Location: [place];  Service: Vascular;  Laterality: Right;   THROMBECTOMY BRACHIAL ARTERY Left 07/26/2018   Procedure: THROMBECTOMY LEFT UPPER EXTREMITY;  Surgeon: Waynetta Sandy, MD;  Location: [place];  Service: Vascular;  Laterality: Left;   [place] Left 07/26/2018   Procedure: STENT LEFT AXILLARY VEIN;  Surgeon: Waynetta Sandy, MD;  Location: [place];  Service: Vascular;  Laterality: Left;    reports that he has quit smoking. His smoking use included cigarettes. He smoked an average of .5 packs per day. He has never used smokeless tobacco. He reports that he does not currently use alcohol after a past usage of about 2.0 standard drinks per week. He reports that he does not use drugs. family  history includes Diabetes in his maternal grandfather, mother, and sister; Heart disease in his mother; Prostate cancer in his paternal grandfather; Transient ischemic attack in his mother. Allergies  Allergen Reactions   Lipitor [Atorvastatin] Other (See Comments)   Current Outpatient Medications on File Prior to Visit  Medication Sig Dispense Refill   acetaminophen (TYLENOL) 650 MG CR tablet Take by mouth.     aspirin 81 MG EC tablet Take by mouth.     cyanocobalamin 1000 MCG tablet Take by mouth.     diltiazem (CARDIZEM LA) 360 MG 24 hr tablet Take by mouth.      diphenhydrAMINE (BENADRYL) 25 mg capsule Take by mouth.     Doxercalciferol (HECTOROL IV) Doxercalciferol (Hectorol)     ferric citrate (AURYXIA) 1 GM 210 MG(Fe) tablet Take by mouth.     glucose blood (ONE TOUCH ULTRA TEST) test strip Use as instructed once daily E11.9 100 each 12   hydrALAZINE (APRESOLINE) 100 MG tablet Take by mouth.     iron sucrose in sodium chloride 0.9 % 100 mL 50 mg.     ketoconazole (NIZORAL) 2 % cream Apply topically.     Lancets (ONETOUCH ULTRASOFT) lancets 1 each by Other route 2 (two) times daily. Use to check blood sugars once a day Dx e11.9 100 each 3   lovastatin (MEVACOR) 40 MG tablet Take 1 tablet (40 mg total) by mouth at bedtime. 90 tablet 2   Methoxy PEG-Epoetin Beta (MIRCERA IJ) Mircera     metoprolol succinate (TOPROL-XL) 50 MG 24 hr tablet TAKE 1 TABLET BY MOUTH  DAILY 90 tablet 1   Multiple Vitamin (MULTIVITAMIN ADULT PO) Take by mouth.     oxyCODONE-acetaminophen (PERCOCET) 5-325 MG tablet Take 1 tablet by mouth every 6 (six) hours as needed for severe pain. 20 tablet 0   pioglitazone (ACTOS) 15 MG tablet Take 1 tablet by mouth once daily 90 tablet 0   rosuvastatin (CRESTOR) 20 MG tablet Take 1 tablet by mouth once daily 90 tablet 3   sitaGLIPtin (JANUVIA) 100 MG tablet Take by mouth.     TRULICITY 1.5 EX/5.2WU SOPN INJECT 0.5ML INTO THE SKIN ONCE A WEEK 4 mL 0   No current facility-administered medications on file prior to visit.        ROS:  All others reviewed and negative.  Objective        PE:  BP 138/70 (BP Location: Right Arm, Patient Position: Sitting, Cuff Size: Large)   Pulse 80   Temp 98.3 F (36.8 C) (Oral)   Ht 6' (1.829 m)   Wt 169 lb (76.7 kg)   SpO2 100%   BMI 22.92 kg/m                 Constitutional: Pt appears in NAD               HENT: Head: NCAT.                Right Ear: External ear normal.                 Left Ear: External ear normal.                Eyes: . Pupils are equal, round, and reactive to light.  Conjunctivae and EOM are normal               Nose: without d/c or deformity  Neck: Neck supple. Gross normal ROM               Cardiovascular: Normal rate and regular rhythm.                 Pulmonary/Chest: Effort normal and breath sounds without rales or wheezing.                Abd:  Soft, NT, ND, + BS, no organomegaly               Neurological: Pt is alert. At baseline orientation, motor grossly intact               Skin: Skin is warm. No rashes, no other new lesions, LE edema - none               Psychiatric: Pt behavior is normal without agitation   Micro: none  Cardiac tracings I have personally interpreted today:  none  Pertinent Radiological findings (summarize): none   Lab Results  Component Value Date   WBC 9.2 09/26/2020   HGB 11.2 (L) 09/26/2020   HCT 32.7 (L) 09/26/2020   PLT 290.0 09/26/2020   GLUCOSE 68 (L) 04/01/2021   CHOL 145 04/01/2021   TRIG 80.0 04/01/2021   HDL 45.50 04/01/2021   LDLDIRECT 131.0 08/11/2017   LDLCALC 83 04/01/2021   ALT 12 04/01/2021   AST 16 04/01/2021   NA 136 04/01/2021   K 4.9 04/01/2021   CL 92 (L) 04/01/2021   CREATININE 7.87 (HH) 04/01/2021   BUN 35 (H) 04/01/2021   CO2 33 (H) 04/01/2021   TSH 1.49 09/26/2020   PSA 1.2 03/19/2020   HGBA1C 6.0 04/01/2021   MICROALBUR 98.3 (H) 08/11/2017   Assessment/Plan:  Christopher Lopez is a 60 y.o. Black or African American [2] male with  has a past medical history of Alcohol abuse (09/18/2011), Allergic rhinitis, cause unspecified (09/20/2011), Anemia, unspecified (09/18/2011), Arthritis, Childhood asthma (09/20/2011), CKD (chronic kidney disease) stage 5, GFR less than 15 ml/min ([place]), Colitis (09/18/2011), Dementia ([place]) (09/18/2011), Diabetes mellitus, Diverticulosis of colon without hemorrhage (02/27/2014), Foot ulcer ([place]) (09/18/2011), High cholesterol, HTN (hypertension) (09/18/2011), Hyperlipidemia (11/08/2011), Hypertension, Impaired glucose tolerance (09/18/2011), Pneumonia (09/18/2011),  Pre-ulcerative corn or callous (11/20/2011), Stroke ([place]), Type II or unspecified type diabetes mellitus without mention of complication, uncontrolled (09/20/2011), and Wears glasses.  Unspecified dementia without behavioral disturbance ([place]) SCAT form filled out for transportation issue, o/w stable  End stage renal disease ([place]) Stable, to continue HD  Type 2 diabetes mellitus with other diabetic kidney complication [facility]) Lab Results  Component Value Date   HGBA1C 6.0 04/01/2021   Stable, pt to continue current medical treatment actos, januvia, trulicity   Hyperlipidemia, unspecified Lab Results  Component Value Date   LDLCALC 83 04/01/2021   Mild uncontrolled,  Goal ldl < 70, pt to continue current statin new crestor as declines change again for now  Essential (primary) hypertension BP Readings from Last 3 Encounters:  04/01/21 138/70  09/26/20 138/76  03/19/20 118/70   Stable, pt to continue medical treatment dilt, toprol, hydralazine   Elevated total protein Also for SPEP with labs,  to f/u any worsening symptoms or concerns Followup: Return in about 6 months (around 09/29/2021).  Christopher Cower, MD 04/04/2021 10:11 PM [facility]

## 2021-04-01 NOTE — Assessment & Plan Note (Addendum)
SCAT form filled out for transportation issue, o/w stable

## 2021-04-01 NOTE — Patient Instructions (Addendum)
You had the flu shot today  Your SCAT form was filled out today  Please consider the Novavax covid vaccine coming out maybe next month in the pharmacies  Please continue all other medications as before, and refills have been done if requested.  Please have the pharmacy call with any other refills you may need.  Please continue your efforts at being more active, low cholesterol diet, and weight control  Please keep your appointments with your specialists as you may have planned  Please go to the LAB at the blood drawing area for the tests to be done  You will be contacted by phone if any changes need to be made immediately.  Otherwise, you will receive a letter about your results with an explanation, but please check with MyChart first.  Please remember to sign up for MyChart if you have not done so, as this will be important to you in the future with finding out test results, communicating by private email, and scheduling acute appointments online when needed.  Please make an Appointment to return in 6 months, or sooner if needed

## 2021-04-02 DIAGNOSIS — E1129 Type 2 diabetes mellitus with other diabetic kidney complication: Secondary | ICD-10-CM | POA: Diagnosis not present

## 2021-04-02 DIAGNOSIS — N2581 Secondary hyperparathyroidism of renal origin: Secondary | ICD-10-CM | POA: Diagnosis not present

## 2021-04-02 DIAGNOSIS — N186 End stage renal disease: Secondary | ICD-10-CM | POA: Diagnosis not present

## 2021-04-02 DIAGNOSIS — D631 Anemia in chronic kidney disease: Secondary | ICD-10-CM | POA: Diagnosis not present

## 2021-04-02 DIAGNOSIS — D689 Coagulation defect, unspecified: Secondary | ICD-10-CM | POA: Diagnosis not present

## 2021-04-02 DIAGNOSIS — D509 Iron deficiency anemia, unspecified: Secondary | ICD-10-CM | POA: Diagnosis not present

## 2021-04-02 DIAGNOSIS — Z992 Dependence on renal dialysis: Secondary | ICD-10-CM | POA: Diagnosis not present

## 2021-04-04 ENCOUNTER — Encounter: Payer: Self-pay | Admitting: Internal Medicine

## 2021-04-04 DIAGNOSIS — D631 Anemia in chronic kidney disease: Secondary | ICD-10-CM | POA: Diagnosis not present

## 2021-04-04 DIAGNOSIS — E1129 Type 2 diabetes mellitus with other diabetic kidney complication: Secondary | ICD-10-CM | POA: Diagnosis not present

## 2021-04-04 DIAGNOSIS — N2581 Secondary hyperparathyroidism of renal origin: Secondary | ICD-10-CM | POA: Diagnosis not present

## 2021-04-04 DIAGNOSIS — D689 Coagulation defect, unspecified: Secondary | ICD-10-CM | POA: Diagnosis not present

## 2021-04-04 DIAGNOSIS — N186 End stage renal disease: Secondary | ICD-10-CM | POA: Diagnosis not present

## 2021-04-04 DIAGNOSIS — Z992 Dependence on renal dialysis: Secondary | ICD-10-CM | POA: Diagnosis not present

## 2021-04-04 DIAGNOSIS — D509 Iron deficiency anemia, unspecified: Secondary | ICD-10-CM | POA: Diagnosis not present

## 2021-04-04 NOTE — Assessment & Plan Note (Signed)
BP Readings from Last 3 Encounters:  04/01/21 138/70  09/26/20 138/76  03/19/20 118/70   Stable, pt to continue medical treatment dilt, toprol, hydralazine

## 2021-04-04 NOTE — Assessment & Plan Note (Signed)
Lab Results  Component Value Date   HGBA1C 6.0 04/01/2021   Stable, pt to continue current medical treatment actos, januvia, trulicity

## 2021-04-04 NOTE — Assessment & Plan Note (Signed)
Stable, to continue HD

## 2021-04-04 NOTE — Assessment & Plan Note (Signed)
Also for SPEP with labs,  to f/u any worsening symptoms or concerns 

## 2021-04-04 NOTE — Assessment & Plan Note (Signed)
Lab Results  Component Value Date   LDLCALC 83 04/01/2021   Mild uncontrolled,  Goal ldl < 70, pt to continue current statin new crestor as declines change again for now

## 2021-04-05 LAB — PROTEIN ELECTROPHORESIS, SERUM
Albumin ELP: 4.4 g/dL (ref 3.8–4.8)
Alpha 1: 0.3 g/dL (ref 0.2–0.3)
Alpha 2: 0.9 g/dL (ref 0.5–0.9)
Beta 2: 0.4 g/dL (ref 0.2–0.5)
Beta Globulin: 0.4 g/dL (ref 0.4–0.6)
Gamma Globulin: 1.9 g/dL — ABNORMAL HIGH (ref 0.8–1.7)
Total Protein: 8.2 g/dL — ABNORMAL HIGH (ref 6.1–8.1)

## 2021-04-07 DIAGNOSIS — D631 Anemia in chronic kidney disease: Secondary | ICD-10-CM | POA: Diagnosis not present

## 2021-04-07 DIAGNOSIS — D509 Iron deficiency anemia, unspecified: Secondary | ICD-10-CM | POA: Diagnosis not present

## 2021-04-07 DIAGNOSIS — N2581 Secondary hyperparathyroidism of renal origin: Secondary | ICD-10-CM | POA: Diagnosis not present

## 2021-04-07 DIAGNOSIS — E1129 Type 2 diabetes mellitus with other diabetic kidney complication: Secondary | ICD-10-CM | POA: Diagnosis not present

## 2021-04-07 DIAGNOSIS — Z992 Dependence on renal dialysis: Secondary | ICD-10-CM | POA: Diagnosis not present

## 2021-04-07 DIAGNOSIS — N186 End stage renal disease: Secondary | ICD-10-CM | POA: Diagnosis not present

## 2021-04-07 DIAGNOSIS — D689 Coagulation defect, unspecified: Secondary | ICD-10-CM | POA: Diagnosis not present

## 2021-04-09 DIAGNOSIS — D689 Coagulation defect, unspecified: Secondary | ICD-10-CM | POA: Diagnosis not present

## 2021-04-09 DIAGNOSIS — N186 End stage renal disease: Secondary | ICD-10-CM | POA: Diagnosis not present

## 2021-04-09 DIAGNOSIS — Z992 Dependence on renal dialysis: Secondary | ICD-10-CM | POA: Diagnosis not present

## 2021-04-09 DIAGNOSIS — D509 Iron deficiency anemia, unspecified: Secondary | ICD-10-CM | POA: Diagnosis not present

## 2021-04-09 DIAGNOSIS — N2581 Secondary hyperparathyroidism of renal origin: Secondary | ICD-10-CM | POA: Diagnosis not present

## 2021-04-09 DIAGNOSIS — D631 Anemia in chronic kidney disease: Secondary | ICD-10-CM | POA: Diagnosis not present

## 2021-04-09 DIAGNOSIS — E1129 Type 2 diabetes mellitus with other diabetic kidney complication: Secondary | ICD-10-CM | POA: Diagnosis not present

## 2021-04-10 DIAGNOSIS — E1122 Type 2 diabetes mellitus with diabetic chronic kidney disease: Secondary | ICD-10-CM | POA: Diagnosis not present

## 2021-04-10 DIAGNOSIS — N186 End stage renal disease: Secondary | ICD-10-CM | POA: Diagnosis not present

## 2021-04-10 DIAGNOSIS — Z992 Dependence on renal dialysis: Secondary | ICD-10-CM | POA: Diagnosis not present

## 2021-04-11 DIAGNOSIS — D631 Anemia in chronic kidney disease: Secondary | ICD-10-CM | POA: Diagnosis not present

## 2021-04-11 DIAGNOSIS — E1129 Type 2 diabetes mellitus with other diabetic kidney complication: Secondary | ICD-10-CM | POA: Diagnosis not present

## 2021-04-11 DIAGNOSIS — D509 Iron deficiency anemia, unspecified: Secondary | ICD-10-CM | POA: Diagnosis not present

## 2021-04-11 DIAGNOSIS — N186 End stage renal disease: Secondary | ICD-10-CM | POA: Diagnosis not present

## 2021-04-11 DIAGNOSIS — N2581 Secondary hyperparathyroidism of renal origin: Secondary | ICD-10-CM | POA: Diagnosis not present

## 2021-04-11 DIAGNOSIS — Z992 Dependence on renal dialysis: Secondary | ICD-10-CM | POA: Diagnosis not present

## 2021-04-11 DIAGNOSIS — D689 Coagulation defect, unspecified: Secondary | ICD-10-CM | POA: Diagnosis not present

## 2021-04-11 DIAGNOSIS — Z23 Encounter for immunization: Secondary | ICD-10-CM | POA: Diagnosis not present

## 2021-04-14 DIAGNOSIS — D689 Coagulation defect, unspecified: Secondary | ICD-10-CM | POA: Diagnosis not present

## 2021-04-14 DIAGNOSIS — D631 Anemia in chronic kidney disease: Secondary | ICD-10-CM | POA: Diagnosis not present

## 2021-04-14 DIAGNOSIS — Z992 Dependence on renal dialysis: Secondary | ICD-10-CM | POA: Diagnosis not present

## 2021-04-14 DIAGNOSIS — E1129 Type 2 diabetes mellitus with other diabetic kidney complication: Secondary | ICD-10-CM | POA: Diagnosis not present

## 2021-04-14 DIAGNOSIS — N186 End stage renal disease: Secondary | ICD-10-CM | POA: Diagnosis not present

## 2021-04-14 DIAGNOSIS — D509 Iron deficiency anemia, unspecified: Secondary | ICD-10-CM | POA: Diagnosis not present

## 2021-04-14 DIAGNOSIS — Z23 Encounter for immunization: Secondary | ICD-10-CM | POA: Diagnosis not present

## 2021-04-14 DIAGNOSIS — N2581 Secondary hyperparathyroidism of renal origin: Secondary | ICD-10-CM | POA: Diagnosis not present

## 2021-04-16 DIAGNOSIS — D509 Iron deficiency anemia, unspecified: Secondary | ICD-10-CM | POA: Diagnosis not present

## 2021-04-16 DIAGNOSIS — D689 Coagulation defect, unspecified: Secondary | ICD-10-CM | POA: Diagnosis not present

## 2021-04-16 DIAGNOSIS — D631 Anemia in chronic kidney disease: Secondary | ICD-10-CM | POA: Diagnosis not present

## 2021-04-16 DIAGNOSIS — Z23 Encounter for immunization: Secondary | ICD-10-CM | POA: Diagnosis not present

## 2021-04-16 DIAGNOSIS — E1129 Type 2 diabetes mellitus with other diabetic kidney complication: Secondary | ICD-10-CM | POA: Diagnosis not present

## 2021-04-16 DIAGNOSIS — N186 End stage renal disease: Secondary | ICD-10-CM | POA: Diagnosis not present

## 2021-04-16 DIAGNOSIS — N2581 Secondary hyperparathyroidism of renal origin: Secondary | ICD-10-CM | POA: Diagnosis not present

## 2021-04-16 DIAGNOSIS — Z992 Dependence on renal dialysis: Secondary | ICD-10-CM | POA: Diagnosis not present

## 2021-04-18 DIAGNOSIS — D689 Coagulation defect, unspecified: Secondary | ICD-10-CM | POA: Diagnosis not present

## 2021-04-18 DIAGNOSIS — D509 Iron deficiency anemia, unspecified: Secondary | ICD-10-CM | POA: Diagnosis not present

## 2021-04-18 DIAGNOSIS — Z992 Dependence on renal dialysis: Secondary | ICD-10-CM | POA: Diagnosis not present

## 2021-04-18 DIAGNOSIS — D631 Anemia in chronic kidney disease: Secondary | ICD-10-CM | POA: Diagnosis not present

## 2021-04-18 DIAGNOSIS — N2581 Secondary hyperparathyroidism of renal origin: Secondary | ICD-10-CM | POA: Diagnosis not present

## 2021-04-18 DIAGNOSIS — Z23 Encounter for immunization: Secondary | ICD-10-CM | POA: Diagnosis not present

## 2021-04-18 DIAGNOSIS — E1129 Type 2 diabetes mellitus with other diabetic kidney complication: Secondary | ICD-10-CM | POA: Diagnosis not present

## 2021-04-18 DIAGNOSIS — N186 End stage renal disease: Secondary | ICD-10-CM | POA: Diagnosis not present

## 2021-04-21 ENCOUNTER — Telehealth: Payer: Self-pay

## 2021-04-21 DIAGNOSIS — Z23 Encounter for immunization: Secondary | ICD-10-CM | POA: Diagnosis not present

## 2021-04-21 DIAGNOSIS — D509 Iron deficiency anemia, unspecified: Secondary | ICD-10-CM | POA: Diagnosis not present

## 2021-04-21 DIAGNOSIS — D689 Coagulation defect, unspecified: Secondary | ICD-10-CM | POA: Diagnosis not present

## 2021-04-21 DIAGNOSIS — E1129 Type 2 diabetes mellitus with other diabetic kidney complication: Secondary | ICD-10-CM | POA: Diagnosis not present

## 2021-04-21 DIAGNOSIS — N186 End stage renal disease: Secondary | ICD-10-CM | POA: Diagnosis not present

## 2021-04-21 DIAGNOSIS — D631 Anemia in chronic kidney disease: Secondary | ICD-10-CM | POA: Diagnosis not present

## 2021-04-21 DIAGNOSIS — Z992 Dependence on renal dialysis: Secondary | ICD-10-CM | POA: Diagnosis not present

## 2021-04-21 DIAGNOSIS — N2581 Secondary hyperparathyroidism of renal origin: Secondary | ICD-10-CM | POA: Diagnosis not present

## 2021-04-21 NOTE — Telephone Encounter (Signed)
No, this is on his list but not prescribed by me in past Pt would need ROV for BP if he feels this may be needed

## 2021-04-21 NOTE — Telephone Encounter (Signed)
Patient's POA states that rx may have been prescribed by nephrologist and she will contact them.

## 2021-04-21 NOTE — Telephone Encounter (Signed)
Received a hard copy prescription refill from [place] for Hydralazine tab?  It has not been filled since 2019. Would you like to write a new script?

## 2021-04-23 DIAGNOSIS — D631 Anemia in chronic kidney disease: Secondary | ICD-10-CM | POA: Diagnosis not present

## 2021-04-23 DIAGNOSIS — N2581 Secondary hyperparathyroidism of renal origin: Secondary | ICD-10-CM | POA: Diagnosis not present

## 2021-04-23 DIAGNOSIS — Z992 Dependence on renal dialysis: Secondary | ICD-10-CM | POA: Diagnosis not present

## 2021-04-23 DIAGNOSIS — N186 End stage renal disease: Secondary | ICD-10-CM | POA: Diagnosis not present

## 2021-04-23 DIAGNOSIS — D509 Iron deficiency anemia, unspecified: Secondary | ICD-10-CM | POA: Diagnosis not present

## 2021-04-23 DIAGNOSIS — D689 Coagulation defect, unspecified: Secondary | ICD-10-CM | POA: Diagnosis not present

## 2021-04-23 DIAGNOSIS — E1129 Type 2 diabetes mellitus with other diabetic kidney complication: Secondary | ICD-10-CM | POA: Diagnosis not present

## 2021-04-23 DIAGNOSIS — Z23 Encounter for immunization: Secondary | ICD-10-CM | POA: Diagnosis not present

## 2021-04-25 DIAGNOSIS — N186 End stage renal disease: Secondary | ICD-10-CM | POA: Diagnosis not present

## 2021-04-25 DIAGNOSIS — Z992 Dependence on renal dialysis: Secondary | ICD-10-CM | POA: Diagnosis not present

## 2021-04-25 DIAGNOSIS — E1129 Type 2 diabetes mellitus with other diabetic kidney complication: Secondary | ICD-10-CM | POA: Diagnosis not present

## 2021-04-25 DIAGNOSIS — D509 Iron deficiency anemia, unspecified: Secondary | ICD-10-CM | POA: Diagnosis not present

## 2021-04-25 DIAGNOSIS — Z23 Encounter for immunization: Secondary | ICD-10-CM | POA: Diagnosis not present

## 2021-04-25 DIAGNOSIS — N2581 Secondary hyperparathyroidism of renal origin: Secondary | ICD-10-CM | POA: Diagnosis not present

## 2021-04-25 DIAGNOSIS — D689 Coagulation defect, unspecified: Secondary | ICD-10-CM | POA: Diagnosis not present

## 2021-04-25 DIAGNOSIS — D631 Anemia in chronic kidney disease: Secondary | ICD-10-CM | POA: Diagnosis not present

## 2021-04-28 DIAGNOSIS — Z992 Dependence on renal dialysis: Secondary | ICD-10-CM | POA: Diagnosis not present

## 2021-04-28 DIAGNOSIS — D689 Coagulation defect, unspecified: Secondary | ICD-10-CM | POA: Diagnosis not present

## 2021-04-28 DIAGNOSIS — D631 Anemia in chronic kidney disease: Secondary | ICD-10-CM | POA: Diagnosis not present

## 2021-04-28 DIAGNOSIS — D509 Iron deficiency anemia, unspecified: Secondary | ICD-10-CM | POA: Diagnosis not present

## 2021-04-28 DIAGNOSIS — N2581 Secondary hyperparathyroidism of renal origin: Secondary | ICD-10-CM | POA: Diagnosis not present

## 2021-04-28 DIAGNOSIS — Z23 Encounter for immunization: Secondary | ICD-10-CM | POA: Diagnosis not present

## 2021-04-28 DIAGNOSIS — N186 End stage renal disease: Secondary | ICD-10-CM | POA: Diagnosis not present

## 2021-04-28 DIAGNOSIS — E1129 Type 2 diabetes mellitus with other diabetic kidney complication: Secondary | ICD-10-CM | POA: Diagnosis not present

## 2021-04-30 DIAGNOSIS — E1129 Type 2 diabetes mellitus with other diabetic kidney complication: Secondary | ICD-10-CM | POA: Diagnosis not present

## 2021-04-30 DIAGNOSIS — N2581 Secondary hyperparathyroidism of renal origin: Secondary | ICD-10-CM | POA: Diagnosis not present

## 2021-04-30 DIAGNOSIS — Z992 Dependence on renal dialysis: Secondary | ICD-10-CM | POA: Diagnosis not present

## 2021-04-30 DIAGNOSIS — N186 End stage renal disease: Secondary | ICD-10-CM | POA: Diagnosis not present

## 2021-04-30 DIAGNOSIS — D689 Coagulation defect, unspecified: Secondary | ICD-10-CM | POA: Diagnosis not present

## 2021-04-30 DIAGNOSIS — D509 Iron deficiency anemia, unspecified: Secondary | ICD-10-CM | POA: Diagnosis not present

## 2021-04-30 DIAGNOSIS — D631 Anemia in chronic kidney disease: Secondary | ICD-10-CM | POA: Diagnosis not present

## 2021-04-30 DIAGNOSIS — Z23 Encounter for immunization: Secondary | ICD-10-CM | POA: Diagnosis not present

## 2021-05-02 DIAGNOSIS — Z992 Dependence on renal dialysis: Secondary | ICD-10-CM | POA: Diagnosis not present

## 2021-05-02 DIAGNOSIS — Z23 Encounter for immunization: Secondary | ICD-10-CM | POA: Diagnosis not present

## 2021-05-02 DIAGNOSIS — D631 Anemia in chronic kidney disease: Secondary | ICD-10-CM | POA: Diagnosis not present

## 2021-05-02 DIAGNOSIS — N2581 Secondary hyperparathyroidism of renal origin: Secondary | ICD-10-CM | POA: Diagnosis not present

## 2021-05-02 DIAGNOSIS — D509 Iron deficiency anemia, unspecified: Secondary | ICD-10-CM | POA: Diagnosis not present

## 2021-05-02 DIAGNOSIS — D689 Coagulation defect, unspecified: Secondary | ICD-10-CM | POA: Diagnosis not present

## 2021-05-02 DIAGNOSIS — N186 End stage renal disease: Secondary | ICD-10-CM | POA: Diagnosis not present

## 2021-05-02 DIAGNOSIS — E1129 Type 2 diabetes mellitus with other diabetic kidney complication: Secondary | ICD-10-CM | POA: Diagnosis not present

## 2021-05-05 DIAGNOSIS — D631 Anemia in chronic kidney disease: Secondary | ICD-10-CM | POA: Diagnosis not present

## 2021-05-05 DIAGNOSIS — N186 End stage renal disease: Secondary | ICD-10-CM | POA: Diagnosis not present

## 2021-05-05 DIAGNOSIS — E1129 Type 2 diabetes mellitus with other diabetic kidney complication: Secondary | ICD-10-CM | POA: Diagnosis not present

## 2021-05-05 DIAGNOSIS — Z23 Encounter for immunization: Secondary | ICD-10-CM | POA: Diagnosis not present

## 2021-05-05 DIAGNOSIS — Z992 Dependence on renal dialysis: Secondary | ICD-10-CM | POA: Diagnosis not present

## 2021-05-05 DIAGNOSIS — N2581 Secondary hyperparathyroidism of renal origin: Secondary | ICD-10-CM | POA: Diagnosis not present

## 2021-05-05 DIAGNOSIS — D689 Coagulation defect, unspecified: Secondary | ICD-10-CM | POA: Diagnosis not present

## 2021-05-05 DIAGNOSIS — D509 Iron deficiency anemia, unspecified: Secondary | ICD-10-CM | POA: Diagnosis not present

## 2021-05-07 DIAGNOSIS — D509 Iron deficiency anemia, unspecified: Secondary | ICD-10-CM | POA: Diagnosis not present

## 2021-05-07 DIAGNOSIS — D689 Coagulation defect, unspecified: Secondary | ICD-10-CM | POA: Diagnosis not present

## 2021-05-07 DIAGNOSIS — Z23 Encounter for immunization: Secondary | ICD-10-CM | POA: Diagnosis not present

## 2021-05-07 DIAGNOSIS — N186 End stage renal disease: Secondary | ICD-10-CM | POA: Diagnosis not present

## 2021-05-07 DIAGNOSIS — N2581 Secondary hyperparathyroidism of renal origin: Secondary | ICD-10-CM | POA: Diagnosis not present

## 2021-05-07 DIAGNOSIS — D631 Anemia in chronic kidney disease: Secondary | ICD-10-CM | POA: Diagnosis not present

## 2021-05-07 DIAGNOSIS — E1129 Type 2 diabetes mellitus with other diabetic kidney complication: Secondary | ICD-10-CM | POA: Diagnosis not present

## 2021-05-07 DIAGNOSIS — Z992 Dependence on renal dialysis: Secondary | ICD-10-CM | POA: Diagnosis not present

## 2021-05-08 ENCOUNTER — Other Ambulatory Visit: Payer: Self-pay | Admitting: Internal Medicine

## 2021-05-09 DIAGNOSIS — E1129 Type 2 diabetes mellitus with other diabetic kidney complication: Secondary | ICD-10-CM | POA: Diagnosis not present

## 2021-05-09 DIAGNOSIS — N2581 Secondary hyperparathyroidism of renal origin: Secondary | ICD-10-CM | POA: Diagnosis not present

## 2021-05-09 DIAGNOSIS — D509 Iron deficiency anemia, unspecified: Secondary | ICD-10-CM | POA: Diagnosis not present

## 2021-05-09 DIAGNOSIS — D631 Anemia in chronic kidney disease: Secondary | ICD-10-CM | POA: Diagnosis not present

## 2021-05-09 DIAGNOSIS — D689 Coagulation defect, unspecified: Secondary | ICD-10-CM | POA: Diagnosis not present

## 2021-05-09 DIAGNOSIS — N186 End stage renal disease: Secondary | ICD-10-CM | POA: Diagnosis not present

## 2021-05-09 DIAGNOSIS — Z992 Dependence on renal dialysis: Secondary | ICD-10-CM | POA: Diagnosis not present

## 2021-05-09 DIAGNOSIS — Z23 Encounter for immunization: Secondary | ICD-10-CM | POA: Diagnosis not present

## 2021-05-11 DIAGNOSIS — Z992 Dependence on renal dialysis: Secondary | ICD-10-CM | POA: Diagnosis not present

## 2021-05-11 DIAGNOSIS — N186 End stage renal disease: Secondary | ICD-10-CM | POA: Diagnosis not present

## 2021-05-11 DIAGNOSIS — E1122 Type 2 diabetes mellitus with diabetic chronic kidney disease: Secondary | ICD-10-CM | POA: Diagnosis not present

## 2021-05-12 DIAGNOSIS — D509 Iron deficiency anemia, unspecified: Secondary | ICD-10-CM | POA: Diagnosis not present

## 2021-05-12 DIAGNOSIS — D689 Coagulation defect, unspecified: Secondary | ICD-10-CM | POA: Diagnosis not present

## 2021-05-12 DIAGNOSIS — E1129 Type 2 diabetes mellitus with other diabetic kidney complication: Secondary | ICD-10-CM | POA: Diagnosis not present

## 2021-05-12 DIAGNOSIS — Z992 Dependence on renal dialysis: Secondary | ICD-10-CM | POA: Diagnosis not present

## 2021-05-12 DIAGNOSIS — N186 End stage renal disease: Secondary | ICD-10-CM | POA: Diagnosis not present

## 2021-05-12 DIAGNOSIS — N2581 Secondary hyperparathyroidism of renal origin: Secondary | ICD-10-CM | POA: Diagnosis not present

## 2021-05-12 DIAGNOSIS — D631 Anemia in chronic kidney disease: Secondary | ICD-10-CM | POA: Diagnosis not present

## 2021-05-14 DIAGNOSIS — Z992 Dependence on renal dialysis: Secondary | ICD-10-CM | POA: Diagnosis not present

## 2021-05-14 DIAGNOSIS — E1129 Type 2 diabetes mellitus with other diabetic kidney complication: Secondary | ICD-10-CM | POA: Diagnosis not present

## 2021-05-14 DIAGNOSIS — N186 End stage renal disease: Secondary | ICD-10-CM | POA: Diagnosis not present

## 2021-05-14 DIAGNOSIS — N2581 Secondary hyperparathyroidism of renal origin: Secondary | ICD-10-CM | POA: Diagnosis not present

## 2021-05-14 DIAGNOSIS — D509 Iron deficiency anemia, unspecified: Secondary | ICD-10-CM | POA: Diagnosis not present

## 2021-05-14 DIAGNOSIS — D689 Coagulation defect, unspecified: Secondary | ICD-10-CM | POA: Diagnosis not present

## 2021-05-14 DIAGNOSIS — D631 Anemia in chronic kidney disease: Secondary | ICD-10-CM | POA: Diagnosis not present

## 2021-05-16 DIAGNOSIS — N2581 Secondary hyperparathyroidism of renal origin: Secondary | ICD-10-CM | POA: Diagnosis not present

## 2021-05-16 DIAGNOSIS — Z992 Dependence on renal dialysis: Secondary | ICD-10-CM | POA: Diagnosis not present

## 2021-05-16 DIAGNOSIS — D631 Anemia in chronic kidney disease: Secondary | ICD-10-CM | POA: Diagnosis not present

## 2021-05-16 DIAGNOSIS — D689 Coagulation defect, unspecified: Secondary | ICD-10-CM | POA: Diagnosis not present

## 2021-05-16 DIAGNOSIS — N186 End stage renal disease: Secondary | ICD-10-CM | POA: Diagnosis not present

## 2021-05-16 DIAGNOSIS — E1129 Type 2 diabetes mellitus with other diabetic kidney complication: Secondary | ICD-10-CM | POA: Diagnosis not present

## 2021-05-16 DIAGNOSIS — D509 Iron deficiency anemia, unspecified: Secondary | ICD-10-CM | POA: Diagnosis not present

## 2021-05-19 DIAGNOSIS — D689 Coagulation defect, unspecified: Secondary | ICD-10-CM | POA: Diagnosis not present

## 2021-05-19 DIAGNOSIS — N186 End stage renal disease: Secondary | ICD-10-CM | POA: Diagnosis not present

## 2021-05-19 DIAGNOSIS — N2581 Secondary hyperparathyroidism of renal origin: Secondary | ICD-10-CM | POA: Diagnosis not present

## 2021-05-19 DIAGNOSIS — D631 Anemia in chronic kidney disease: Secondary | ICD-10-CM | POA: Diagnosis not present

## 2021-05-19 DIAGNOSIS — D509 Iron deficiency anemia, unspecified: Secondary | ICD-10-CM | POA: Diagnosis not present

## 2021-05-19 DIAGNOSIS — Z992 Dependence on renal dialysis: Secondary | ICD-10-CM | POA: Diagnosis not present

## 2021-05-19 DIAGNOSIS — E1129 Type 2 diabetes mellitus with other diabetic kidney complication: Secondary | ICD-10-CM | POA: Diagnosis not present

## 2021-05-20 ENCOUNTER — Other Ambulatory Visit: Payer: Self-pay | Admitting: Internal Medicine

## 2021-05-20 NOTE — Telephone Encounter (Signed)
Please refill as per office routine med refill policy (all routine meds to be refilled for 3 mo or monthly (per pt preference) up to one year from last visit, then month to month grace period for 3 mo, then further med refills will have to be denied) ? ?

## 2021-05-21 DIAGNOSIS — Z992 Dependence on renal dialysis: Secondary | ICD-10-CM | POA: Diagnosis not present

## 2021-05-21 DIAGNOSIS — E1129 Type 2 diabetes mellitus with other diabetic kidney complication: Secondary | ICD-10-CM | POA: Diagnosis not present

## 2021-05-21 DIAGNOSIS — N186 End stage renal disease: Secondary | ICD-10-CM | POA: Diagnosis not present

## 2021-05-21 DIAGNOSIS — N2581 Secondary hyperparathyroidism of renal origin: Secondary | ICD-10-CM | POA: Diagnosis not present

## 2021-05-21 DIAGNOSIS — D631 Anemia in chronic kidney disease: Secondary | ICD-10-CM | POA: Diagnosis not present

## 2021-05-21 DIAGNOSIS — D509 Iron deficiency anemia, unspecified: Secondary | ICD-10-CM | POA: Diagnosis not present

## 2021-05-21 DIAGNOSIS — D689 Coagulation defect, unspecified: Secondary | ICD-10-CM | POA: Diagnosis not present

## 2021-05-23 DIAGNOSIS — D631 Anemia in chronic kidney disease: Secondary | ICD-10-CM | POA: Diagnosis not present

## 2021-05-23 DIAGNOSIS — D509 Iron deficiency anemia, unspecified: Secondary | ICD-10-CM | POA: Diagnosis not present

## 2021-05-23 DIAGNOSIS — D689 Coagulation defect, unspecified: Secondary | ICD-10-CM | POA: Diagnosis not present

## 2021-05-23 DIAGNOSIS — N2581 Secondary hyperparathyroidism of renal origin: Secondary | ICD-10-CM | POA: Diagnosis not present

## 2021-05-23 DIAGNOSIS — N186 End stage renal disease: Secondary | ICD-10-CM | POA: Diagnosis not present

## 2021-05-23 DIAGNOSIS — E1129 Type 2 diabetes mellitus with other diabetic kidney complication: Secondary | ICD-10-CM | POA: Diagnosis not present

## 2021-05-23 DIAGNOSIS — Z992 Dependence on renal dialysis: Secondary | ICD-10-CM | POA: Diagnosis not present

## 2021-05-25 ENCOUNTER — Encounter: Payer: Self-pay | Admitting: Podiatry

## 2021-05-25 ENCOUNTER — Other Ambulatory Visit: Payer: Self-pay

## 2021-05-25 ENCOUNTER — Ambulatory Visit (INDEPENDENT_AMBULATORY_CARE_PROVIDER_SITE_OTHER): Payer: Medicare Other | Admitting: Podiatry

## 2021-05-25 DIAGNOSIS — M79676 Pain in unspecified toe(s): Secondary | ICD-10-CM

## 2021-05-25 DIAGNOSIS — Q828 Other specified congenital malformations of skin: Secondary | ICD-10-CM

## 2021-05-25 DIAGNOSIS — L280 Lichen simplex chronicus: Secondary | ICD-10-CM

## 2021-05-25 DIAGNOSIS — B351 Tinea unguium: Secondary | ICD-10-CM

## 2021-05-25 DIAGNOSIS — E1142 Type 2 diabetes mellitus with diabetic polyneuropathy: Secondary | ICD-10-CM

## 2021-05-25 NOTE — Progress Notes (Signed)
This patient returns to my office for at risk foot care.  This patient requires this care by a professional since this patient will be at risk due to having  ESRD, CKD and diabetes.  This patient is unable to cut nails himself since the patient cannot reach his nails.These nails are painful walking and wearing shoes.  This patient presents for at risk foot care today.  General Appearance  Alert, conversant and in no acute stress.  Vascular  Dorsalis pedis and posterior tibial  pulses are palpable  bilaterally.  Capillary return is within normal limits  bilaterally. Temperature is within normal limits  bilaterally.  Neurologic  Senn-Weinstein monofilament wire test diminished   bilaterally. Muscle power within normal limits bilaterally.  Nails Thick disfigured discolored nails with subungual debris  from hallux to fifth toes bilaterally. No evidence of bacterial infection or drainage bilaterally.  Orthopedic  No limitations of motion  feet .  No crepitus or effusions noted.  No bony pathology or digital deformities noted.  Skin  normotropic skin  noted bilaterally.  No signs of infections or ulcers noted.   Porokeratosis sub 4  B/L symptomatic.  Onychomycosis  Pain in right toes  Pain in left toes  Porokeratosis sub 4  B/L.  Consent was obtained for treatment procedures.   Mechanical debridement of nails 1-5  bilaterally performed with a nail nipper.  Filed with dremel without incident.  Debride porokeratosis with # 15 blade.   Return office visit   9 weeks                  Told patient to return for periodic foot care and evaluation due to potential at risk complications.   Merle Cirelli DPM  

## 2021-05-26 DIAGNOSIS — N2581 Secondary hyperparathyroidism of renal origin: Secondary | ICD-10-CM | POA: Diagnosis not present

## 2021-05-26 DIAGNOSIS — Z992 Dependence on renal dialysis: Secondary | ICD-10-CM | POA: Diagnosis not present

## 2021-05-26 DIAGNOSIS — D509 Iron deficiency anemia, unspecified: Secondary | ICD-10-CM | POA: Diagnosis not present

## 2021-05-26 DIAGNOSIS — D631 Anemia in chronic kidney disease: Secondary | ICD-10-CM | POA: Diagnosis not present

## 2021-05-26 DIAGNOSIS — D689 Coagulation defect, unspecified: Secondary | ICD-10-CM | POA: Diagnosis not present

## 2021-05-26 DIAGNOSIS — E1129 Type 2 diabetes mellitus with other diabetic kidney complication: Secondary | ICD-10-CM | POA: Diagnosis not present

## 2021-05-26 DIAGNOSIS — N186 End stage renal disease: Secondary | ICD-10-CM | POA: Diagnosis not present

## 2021-05-28 DIAGNOSIS — E1129 Type 2 diabetes mellitus with other diabetic kidney complication: Secondary | ICD-10-CM | POA: Diagnosis not present

## 2021-05-28 DIAGNOSIS — D631 Anemia in chronic kidney disease: Secondary | ICD-10-CM | POA: Diagnosis not present

## 2021-05-28 DIAGNOSIS — N186 End stage renal disease: Secondary | ICD-10-CM | POA: Diagnosis not present

## 2021-05-28 DIAGNOSIS — N2581 Secondary hyperparathyroidism of renal origin: Secondary | ICD-10-CM | POA: Diagnosis not present

## 2021-05-28 DIAGNOSIS — Z992 Dependence on renal dialysis: Secondary | ICD-10-CM | POA: Diagnosis not present

## 2021-05-28 DIAGNOSIS — D689 Coagulation defect, unspecified: Secondary | ICD-10-CM | POA: Diagnosis not present

## 2021-05-28 DIAGNOSIS — D509 Iron deficiency anemia, unspecified: Secondary | ICD-10-CM | POA: Diagnosis not present

## 2021-05-30 DIAGNOSIS — D689 Coagulation defect, unspecified: Secondary | ICD-10-CM | POA: Diagnosis not present

## 2021-05-30 DIAGNOSIS — N2581 Secondary hyperparathyroidism of renal origin: Secondary | ICD-10-CM | POA: Diagnosis not present

## 2021-05-30 DIAGNOSIS — E1129 Type 2 diabetes mellitus with other diabetic kidney complication: Secondary | ICD-10-CM | POA: Diagnosis not present

## 2021-05-30 DIAGNOSIS — Z992 Dependence on renal dialysis: Secondary | ICD-10-CM | POA: Diagnosis not present

## 2021-05-30 DIAGNOSIS — N186 End stage renal disease: Secondary | ICD-10-CM | POA: Diagnosis not present

## 2021-05-30 DIAGNOSIS — D631 Anemia in chronic kidney disease: Secondary | ICD-10-CM | POA: Diagnosis not present

## 2021-05-30 DIAGNOSIS — D509 Iron deficiency anemia, unspecified: Secondary | ICD-10-CM | POA: Diagnosis not present

## 2021-06-02 DIAGNOSIS — D689 Coagulation defect, unspecified: Secondary | ICD-10-CM | POA: Diagnosis not present

## 2021-06-02 DIAGNOSIS — Z992 Dependence on renal dialysis: Secondary | ICD-10-CM | POA: Diagnosis not present

## 2021-06-02 DIAGNOSIS — D509 Iron deficiency anemia, unspecified: Secondary | ICD-10-CM | POA: Diagnosis not present

## 2021-06-02 DIAGNOSIS — E1129 Type 2 diabetes mellitus with other diabetic kidney complication: Secondary | ICD-10-CM | POA: Diagnosis not present

## 2021-06-02 DIAGNOSIS — N186 End stage renal disease: Secondary | ICD-10-CM | POA: Diagnosis not present

## 2021-06-02 DIAGNOSIS — N2581 Secondary hyperparathyroidism of renal origin: Secondary | ICD-10-CM | POA: Diagnosis not present

## 2021-06-02 DIAGNOSIS — D631 Anemia in chronic kidney disease: Secondary | ICD-10-CM | POA: Diagnosis not present

## 2021-06-05 DIAGNOSIS — Z992 Dependence on renal dialysis: Secondary | ICD-10-CM | POA: Diagnosis not present

## 2021-06-05 DIAGNOSIS — D631 Anemia in chronic kidney disease: Secondary | ICD-10-CM | POA: Diagnosis not present

## 2021-06-05 DIAGNOSIS — E1129 Type 2 diabetes mellitus with other diabetic kidney complication: Secondary | ICD-10-CM | POA: Diagnosis not present

## 2021-06-05 DIAGNOSIS — D689 Coagulation defect, unspecified: Secondary | ICD-10-CM | POA: Diagnosis not present

## 2021-06-05 DIAGNOSIS — N186 End stage renal disease: Secondary | ICD-10-CM | POA: Diagnosis not present

## 2021-06-05 DIAGNOSIS — D509 Iron deficiency anemia, unspecified: Secondary | ICD-10-CM | POA: Diagnosis not present

## 2021-06-05 DIAGNOSIS — N2581 Secondary hyperparathyroidism of renal origin: Secondary | ICD-10-CM | POA: Diagnosis not present

## 2021-06-07 DIAGNOSIS — N2581 Secondary hyperparathyroidism of renal origin: Secondary | ICD-10-CM | POA: Diagnosis not present

## 2021-06-07 DIAGNOSIS — N186 End stage renal disease: Secondary | ICD-10-CM | POA: Diagnosis not present

## 2021-06-07 DIAGNOSIS — D509 Iron deficiency anemia, unspecified: Secondary | ICD-10-CM | POA: Diagnosis not present

## 2021-06-07 DIAGNOSIS — D631 Anemia in chronic kidney disease: Secondary | ICD-10-CM | POA: Diagnosis not present

## 2021-06-07 DIAGNOSIS — E1129 Type 2 diabetes mellitus with other diabetic kidney complication: Secondary | ICD-10-CM | POA: Diagnosis not present

## 2021-06-07 DIAGNOSIS — D689 Coagulation defect, unspecified: Secondary | ICD-10-CM | POA: Diagnosis not present

## 2021-06-07 DIAGNOSIS — Z992 Dependence on renal dialysis: Secondary | ICD-10-CM | POA: Diagnosis not present

## 2021-06-09 DIAGNOSIS — N186 End stage renal disease: Secondary | ICD-10-CM | POA: Diagnosis not present

## 2021-06-09 DIAGNOSIS — E1129 Type 2 diabetes mellitus with other diabetic kidney complication: Secondary | ICD-10-CM | POA: Diagnosis not present

## 2021-06-09 DIAGNOSIS — N2581 Secondary hyperparathyroidism of renal origin: Secondary | ICD-10-CM | POA: Diagnosis not present

## 2021-06-09 DIAGNOSIS — D631 Anemia in chronic kidney disease: Secondary | ICD-10-CM | POA: Diagnosis not present

## 2021-06-09 DIAGNOSIS — D509 Iron deficiency anemia, unspecified: Secondary | ICD-10-CM | POA: Diagnosis not present

## 2021-06-09 DIAGNOSIS — Z992 Dependence on renal dialysis: Secondary | ICD-10-CM | POA: Diagnosis not present

## 2021-06-09 DIAGNOSIS — D689 Coagulation defect, unspecified: Secondary | ICD-10-CM | POA: Diagnosis not present

## 2021-06-10 DIAGNOSIS — N186 End stage renal disease: Secondary | ICD-10-CM | POA: Diagnosis not present

## 2021-06-10 DIAGNOSIS — E1122 Type 2 diabetes mellitus with diabetic chronic kidney disease: Secondary | ICD-10-CM | POA: Diagnosis not present

## 2021-06-10 DIAGNOSIS — Z992 Dependence on renal dialysis: Secondary | ICD-10-CM | POA: Diagnosis not present

## 2021-06-11 ENCOUNTER — Other Ambulatory Visit: Payer: Self-pay | Admitting: Internal Medicine

## 2021-06-11 DIAGNOSIS — D689 Coagulation defect, unspecified: Secondary | ICD-10-CM | POA: Diagnosis not present

## 2021-06-11 DIAGNOSIS — N186 End stage renal disease: Secondary | ICD-10-CM | POA: Diagnosis not present

## 2021-06-11 DIAGNOSIS — Z992 Dependence on renal dialysis: Secondary | ICD-10-CM | POA: Diagnosis not present

## 2021-06-11 DIAGNOSIS — N2581 Secondary hyperparathyroidism of renal origin: Secondary | ICD-10-CM | POA: Diagnosis not present

## 2021-06-11 NOTE — Telephone Encounter (Signed)
Please refill as per office routine med refill policy (all routine meds to be refilled for 3 mo or monthly (per pt preference) up to one year from last visit, then month to month grace period for 3 mo, then further med refills will have to be denied) ? ?

## 2021-06-13 DIAGNOSIS — D689 Coagulation defect, unspecified: Secondary | ICD-10-CM | POA: Diagnosis not present

## 2021-06-13 DIAGNOSIS — Z992 Dependence on renal dialysis: Secondary | ICD-10-CM | POA: Diagnosis not present

## 2021-06-13 DIAGNOSIS — N2581 Secondary hyperparathyroidism of renal origin: Secondary | ICD-10-CM | POA: Diagnosis not present

## 2021-06-13 DIAGNOSIS — N186 End stage renal disease: Secondary | ICD-10-CM | POA: Diagnosis not present

## 2021-06-15 ENCOUNTER — Other Ambulatory Visit: Payer: Self-pay | Admitting: Internal Medicine

## 2021-06-15 NOTE — Telephone Encounter (Signed)
Please refill as per office routine med refill policy (all routine meds to be refilled for 3 mo or monthly (per pt preference) up to one year from last visit, then month to month grace period for 3 mo, then further med refills will have to be denied) ? ?

## 2021-06-16 DIAGNOSIS — N186 End stage renal disease: Secondary | ICD-10-CM | POA: Diagnosis not present

## 2021-06-16 DIAGNOSIS — D689 Coagulation defect, unspecified: Secondary | ICD-10-CM | POA: Diagnosis not present

## 2021-06-16 DIAGNOSIS — Z992 Dependence on renal dialysis: Secondary | ICD-10-CM | POA: Diagnosis not present

## 2021-06-16 DIAGNOSIS — N2581 Secondary hyperparathyroidism of renal origin: Secondary | ICD-10-CM | POA: Diagnosis not present

## 2021-06-18 DIAGNOSIS — Z992 Dependence on renal dialysis: Secondary | ICD-10-CM | POA: Diagnosis not present

## 2021-06-18 DIAGNOSIS — D689 Coagulation defect, unspecified: Secondary | ICD-10-CM | POA: Diagnosis not present

## 2021-06-18 DIAGNOSIS — N186 End stage renal disease: Secondary | ICD-10-CM | POA: Diagnosis not present

## 2021-06-18 DIAGNOSIS — N2581 Secondary hyperparathyroidism of renal origin: Secondary | ICD-10-CM | POA: Diagnosis not present

## 2021-06-20 DIAGNOSIS — N186 End stage renal disease: Secondary | ICD-10-CM | POA: Diagnosis not present

## 2021-06-20 DIAGNOSIS — N2581 Secondary hyperparathyroidism of renal origin: Secondary | ICD-10-CM | POA: Diagnosis not present

## 2021-06-20 DIAGNOSIS — D689 Coagulation defect, unspecified: Secondary | ICD-10-CM | POA: Diagnosis not present

## 2021-06-20 DIAGNOSIS — Z992 Dependence on renal dialysis: Secondary | ICD-10-CM | POA: Diagnosis not present

## 2021-06-23 DIAGNOSIS — D689 Coagulation defect, unspecified: Secondary | ICD-10-CM | POA: Diagnosis not present

## 2021-06-23 DIAGNOSIS — Z992 Dependence on renal dialysis: Secondary | ICD-10-CM | POA: Diagnosis not present

## 2021-06-23 DIAGNOSIS — N186 End stage renal disease: Secondary | ICD-10-CM | POA: Diagnosis not present

## 2021-06-23 DIAGNOSIS — N2581 Secondary hyperparathyroidism of renal origin: Secondary | ICD-10-CM | POA: Diagnosis not present

## 2021-06-25 DIAGNOSIS — Z992 Dependence on renal dialysis: Secondary | ICD-10-CM | POA: Diagnosis not present

## 2021-06-25 DIAGNOSIS — D689 Coagulation defect, unspecified: Secondary | ICD-10-CM | POA: Diagnosis not present

## 2021-06-25 DIAGNOSIS — N186 End stage renal disease: Secondary | ICD-10-CM | POA: Diagnosis not present

## 2021-06-25 DIAGNOSIS — N2581 Secondary hyperparathyroidism of renal origin: Secondary | ICD-10-CM | POA: Diagnosis not present

## 2021-06-27 DIAGNOSIS — Z992 Dependence on renal dialysis: Secondary | ICD-10-CM | POA: Diagnosis not present

## 2021-06-27 DIAGNOSIS — N2581 Secondary hyperparathyroidism of renal origin: Secondary | ICD-10-CM | POA: Diagnosis not present

## 2021-06-27 DIAGNOSIS — N186 End stage renal disease: Secondary | ICD-10-CM | POA: Diagnosis not present

## 2021-06-27 DIAGNOSIS — D689 Coagulation defect, unspecified: Secondary | ICD-10-CM | POA: Diagnosis not present

## 2021-06-30 DIAGNOSIS — N186 End stage renal disease: Secondary | ICD-10-CM | POA: Diagnosis not present

## 2021-06-30 DIAGNOSIS — N2581 Secondary hyperparathyroidism of renal origin: Secondary | ICD-10-CM | POA: Diagnosis not present

## 2021-06-30 DIAGNOSIS — D689 Coagulation defect, unspecified: Secondary | ICD-10-CM | POA: Diagnosis not present

## 2021-06-30 DIAGNOSIS — Z992 Dependence on renal dialysis: Secondary | ICD-10-CM | POA: Diagnosis not present

## 2021-07-02 DIAGNOSIS — Z992 Dependence on renal dialysis: Secondary | ICD-10-CM | POA: Diagnosis not present

## 2021-07-02 DIAGNOSIS — D689 Coagulation defect, unspecified: Secondary | ICD-10-CM | POA: Diagnosis not present

## 2021-07-02 DIAGNOSIS — N2581 Secondary hyperparathyroidism of renal origin: Secondary | ICD-10-CM | POA: Diagnosis not present

## 2021-07-02 DIAGNOSIS — N186 End stage renal disease: Secondary | ICD-10-CM | POA: Diagnosis not present

## 2021-07-04 DIAGNOSIS — N2581 Secondary hyperparathyroidism of renal origin: Secondary | ICD-10-CM | POA: Diagnosis not present

## 2021-07-04 DIAGNOSIS — D689 Coagulation defect, unspecified: Secondary | ICD-10-CM | POA: Diagnosis not present

## 2021-07-04 DIAGNOSIS — Z992 Dependence on renal dialysis: Secondary | ICD-10-CM | POA: Diagnosis not present

## 2021-07-04 DIAGNOSIS — N186 End stage renal disease: Secondary | ICD-10-CM | POA: Diagnosis not present

## 2021-07-07 DIAGNOSIS — N2581 Secondary hyperparathyroidism of renal origin: Secondary | ICD-10-CM | POA: Diagnosis not present

## 2021-07-07 DIAGNOSIS — Z992 Dependence on renal dialysis: Secondary | ICD-10-CM | POA: Diagnosis not present

## 2021-07-07 DIAGNOSIS — N186 End stage renal disease: Secondary | ICD-10-CM | POA: Diagnosis not present

## 2021-07-07 DIAGNOSIS — D689 Coagulation defect, unspecified: Secondary | ICD-10-CM | POA: Diagnosis not present

## 2021-07-09 DIAGNOSIS — Z992 Dependence on renal dialysis: Secondary | ICD-10-CM | POA: Diagnosis not present

## 2021-07-09 DIAGNOSIS — D689 Coagulation defect, unspecified: Secondary | ICD-10-CM | POA: Diagnosis not present

## 2021-07-09 DIAGNOSIS — N2581 Secondary hyperparathyroidism of renal origin: Secondary | ICD-10-CM | POA: Diagnosis not present

## 2021-07-09 DIAGNOSIS — N186 End stage renal disease: Secondary | ICD-10-CM | POA: Diagnosis not present

## 2021-07-11 DIAGNOSIS — Z992 Dependence on renal dialysis: Secondary | ICD-10-CM | POA: Diagnosis not present

## 2021-07-11 DIAGNOSIS — D689 Coagulation defect, unspecified: Secondary | ICD-10-CM | POA: Diagnosis not present

## 2021-07-11 DIAGNOSIS — N2581 Secondary hyperparathyroidism of renal origin: Secondary | ICD-10-CM | POA: Diagnosis not present

## 2021-07-11 DIAGNOSIS — N186 End stage renal disease: Secondary | ICD-10-CM | POA: Diagnosis not present

## 2021-07-11 DIAGNOSIS — E1122 Type 2 diabetes mellitus with diabetic chronic kidney disease: Secondary | ICD-10-CM | POA: Diagnosis not present

## 2021-07-14 DIAGNOSIS — D689 Coagulation defect, unspecified: Secondary | ICD-10-CM | POA: Diagnosis not present

## 2021-07-14 DIAGNOSIS — Z992 Dependence on renal dialysis: Secondary | ICD-10-CM | POA: Diagnosis not present

## 2021-07-14 DIAGNOSIS — E1129 Type 2 diabetes mellitus with other diabetic kidney complication: Secondary | ICD-10-CM | POA: Diagnosis not present

## 2021-07-14 DIAGNOSIS — N186 End stage renal disease: Secondary | ICD-10-CM | POA: Diagnosis not present

## 2021-07-14 DIAGNOSIS — N2581 Secondary hyperparathyroidism of renal origin: Secondary | ICD-10-CM | POA: Diagnosis not present

## 2021-07-16 DIAGNOSIS — N2581 Secondary hyperparathyroidism of renal origin: Secondary | ICD-10-CM | POA: Diagnosis not present

## 2021-07-16 DIAGNOSIS — E1129 Type 2 diabetes mellitus with other diabetic kidney complication: Secondary | ICD-10-CM | POA: Diagnosis not present

## 2021-07-16 DIAGNOSIS — Z992 Dependence on renal dialysis: Secondary | ICD-10-CM | POA: Diagnosis not present

## 2021-07-16 DIAGNOSIS — D689 Coagulation defect, unspecified: Secondary | ICD-10-CM | POA: Diagnosis not present

## 2021-07-16 DIAGNOSIS — N186 End stage renal disease: Secondary | ICD-10-CM | POA: Diagnosis not present

## 2021-07-18 DIAGNOSIS — D689 Coagulation defect, unspecified: Secondary | ICD-10-CM | POA: Diagnosis not present

## 2021-07-18 DIAGNOSIS — N186 End stage renal disease: Secondary | ICD-10-CM | POA: Diagnosis not present

## 2021-07-18 DIAGNOSIS — N2581 Secondary hyperparathyroidism of renal origin: Secondary | ICD-10-CM | POA: Diagnosis not present

## 2021-07-18 DIAGNOSIS — E1129 Type 2 diabetes mellitus with other diabetic kidney complication: Secondary | ICD-10-CM | POA: Diagnosis not present

## 2021-07-18 DIAGNOSIS — Z992 Dependence on renal dialysis: Secondary | ICD-10-CM | POA: Diagnosis not present

## 2021-07-21 DIAGNOSIS — D689 Coagulation defect, unspecified: Secondary | ICD-10-CM | POA: Diagnosis not present

## 2021-07-21 DIAGNOSIS — N2581 Secondary hyperparathyroidism of renal origin: Secondary | ICD-10-CM | POA: Diagnosis not present

## 2021-07-21 DIAGNOSIS — E1129 Type 2 diabetes mellitus with other diabetic kidney complication: Secondary | ICD-10-CM | POA: Diagnosis not present

## 2021-07-21 DIAGNOSIS — Z992 Dependence on renal dialysis: Secondary | ICD-10-CM | POA: Diagnosis not present

## 2021-07-21 DIAGNOSIS — N186 End stage renal disease: Secondary | ICD-10-CM | POA: Diagnosis not present

## 2021-07-23 DIAGNOSIS — E1129 Type 2 diabetes mellitus with other diabetic kidney complication: Secondary | ICD-10-CM | POA: Diagnosis not present

## 2021-07-23 DIAGNOSIS — N186 End stage renal disease: Secondary | ICD-10-CM | POA: Diagnosis not present

## 2021-07-23 DIAGNOSIS — D689 Coagulation defect, unspecified: Secondary | ICD-10-CM | POA: Diagnosis not present

## 2021-07-23 DIAGNOSIS — N2581 Secondary hyperparathyroidism of renal origin: Secondary | ICD-10-CM | POA: Diagnosis not present

## 2021-07-23 DIAGNOSIS — Z992 Dependence on renal dialysis: Secondary | ICD-10-CM | POA: Diagnosis not present

## 2021-07-25 DIAGNOSIS — E1129 Type 2 diabetes mellitus with other diabetic kidney complication: Secondary | ICD-10-CM | POA: Diagnosis not present

## 2021-07-25 DIAGNOSIS — D689 Coagulation defect, unspecified: Secondary | ICD-10-CM | POA: Diagnosis not present

## 2021-07-25 DIAGNOSIS — N2581 Secondary hyperparathyroidism of renal origin: Secondary | ICD-10-CM | POA: Diagnosis not present

## 2021-07-25 DIAGNOSIS — N186 End stage renal disease: Secondary | ICD-10-CM | POA: Diagnosis not present

## 2021-07-25 DIAGNOSIS — Z992 Dependence on renal dialysis: Secondary | ICD-10-CM | POA: Diagnosis not present

## 2021-07-28 DIAGNOSIS — Z992 Dependence on renal dialysis: Secondary | ICD-10-CM | POA: Diagnosis not present

## 2021-07-28 DIAGNOSIS — N2581 Secondary hyperparathyroidism of renal origin: Secondary | ICD-10-CM | POA: Diagnosis not present

## 2021-07-28 DIAGNOSIS — E1129 Type 2 diabetes mellitus with other diabetic kidney complication: Secondary | ICD-10-CM | POA: Diagnosis not present

## 2021-07-28 DIAGNOSIS — D689 Coagulation defect, unspecified: Secondary | ICD-10-CM | POA: Diagnosis not present

## 2021-07-28 DIAGNOSIS — N186 End stage renal disease: Secondary | ICD-10-CM | POA: Diagnosis not present

## 2021-07-29 ENCOUNTER — Ambulatory Visit: Payer: Medicare Other | Admitting: Podiatry

## 2021-07-29 ENCOUNTER — Encounter: Payer: Self-pay | Admitting: Podiatry

## 2021-07-29 ENCOUNTER — Other Ambulatory Visit: Payer: Self-pay

## 2021-07-29 DIAGNOSIS — Q828 Other specified congenital malformations of skin: Secondary | ICD-10-CM | POA: Diagnosis not present

## 2021-07-29 DIAGNOSIS — E1142 Type 2 diabetes mellitus with diabetic polyneuropathy: Secondary | ICD-10-CM

## 2021-07-29 DIAGNOSIS — N186 End stage renal disease: Secondary | ICD-10-CM | POA: Diagnosis not present

## 2021-07-29 DIAGNOSIS — M79676 Pain in unspecified toe(s): Secondary | ICD-10-CM

## 2021-07-29 DIAGNOSIS — B351 Tinea unguium: Secondary | ICD-10-CM | POA: Diagnosis not present

## 2021-07-29 DIAGNOSIS — D689 Coagulation defect, unspecified: Secondary | ICD-10-CM | POA: Diagnosis not present

## 2021-07-29 NOTE — Progress Notes (Signed)
This patient returns to my office for at risk foot care.  This patient requires this care by a professional since this patient will be at risk due to having  ESRD, CKD and diabetes.  This patient is unable to cut nails himself since the patient cannot reach his nails.These nails are painful walking and wearing shoes.  This patient presents for at risk foot care today.  General Appearance  Alert, conversant and in no acute stress.  Vascular  Dorsalis pedis and posterior tibial  pulses are palpable  bilaterally.  Capillary return is within normal limits  bilaterally. Temperature is within normal limits  bilaterally.  Neurologic  Senn-Weinstein monofilament wire test diminished   bilaterally. Muscle power within normal limits bilaterally.  Nails Thick disfigured discolored nails with subungual debris  from hallux to fifth toes bilaterally. No evidence of bacterial infection or drainage bilaterally.  Orthopedic  No limitations of motion  feet .  No crepitus or effusions noted.  No bony pathology or digital deformities noted.  Skin  normotropic skin  noted bilaterally.  No signs of infections or ulcers noted.   Porokeratosis sub 4  B/L symptomatic.  Onychomycosis  Pain in right toes  Pain in left toes  Porokeratosis sub 4  B/L.  Consent was obtained for treatment procedures.   Mechanical debridement of nails 1-5  bilaterally performed with a nail nipper.  Filed with dremel without incident.  Debride porokeratosis with # 15 blade.   Return office visit   9 weeks                  Told patient to return for periodic foot care and evaluation due to potential at risk complications.   Kaceton Vieau DPM  

## 2021-07-30 DIAGNOSIS — D689 Coagulation defect, unspecified: Secondary | ICD-10-CM | POA: Diagnosis not present

## 2021-07-30 DIAGNOSIS — N2581 Secondary hyperparathyroidism of renal origin: Secondary | ICD-10-CM | POA: Diagnosis not present

## 2021-07-30 DIAGNOSIS — E1129 Type 2 diabetes mellitus with other diabetic kidney complication: Secondary | ICD-10-CM | POA: Diagnosis not present

## 2021-07-30 DIAGNOSIS — Z992 Dependence on renal dialysis: Secondary | ICD-10-CM | POA: Diagnosis not present

## 2021-07-30 DIAGNOSIS — N186 End stage renal disease: Secondary | ICD-10-CM | POA: Diagnosis not present

## 2021-08-01 DIAGNOSIS — Z992 Dependence on renal dialysis: Secondary | ICD-10-CM | POA: Diagnosis not present

## 2021-08-01 DIAGNOSIS — E1129 Type 2 diabetes mellitus with other diabetic kidney complication: Secondary | ICD-10-CM | POA: Diagnosis not present

## 2021-08-01 DIAGNOSIS — N2581 Secondary hyperparathyroidism of renal origin: Secondary | ICD-10-CM | POA: Diagnosis not present

## 2021-08-01 DIAGNOSIS — D689 Coagulation defect, unspecified: Secondary | ICD-10-CM | POA: Diagnosis not present

## 2021-08-01 DIAGNOSIS — N186 End stage renal disease: Secondary | ICD-10-CM | POA: Diagnosis not present

## 2021-08-04 DIAGNOSIS — Z992 Dependence on renal dialysis: Secondary | ICD-10-CM | POA: Diagnosis not present

## 2021-08-04 DIAGNOSIS — N186 End stage renal disease: Secondary | ICD-10-CM | POA: Diagnosis not present

## 2021-08-04 DIAGNOSIS — E1129 Type 2 diabetes mellitus with other diabetic kidney complication: Secondary | ICD-10-CM | POA: Diagnosis not present

## 2021-08-04 DIAGNOSIS — D689 Coagulation defect, unspecified: Secondary | ICD-10-CM | POA: Diagnosis not present

## 2021-08-04 DIAGNOSIS — N2581 Secondary hyperparathyroidism of renal origin: Secondary | ICD-10-CM | POA: Diagnosis not present

## 2021-08-06 DIAGNOSIS — Z992 Dependence on renal dialysis: Secondary | ICD-10-CM | POA: Diagnosis not present

## 2021-08-06 DIAGNOSIS — D689 Coagulation defect, unspecified: Secondary | ICD-10-CM | POA: Diagnosis not present

## 2021-08-06 DIAGNOSIS — N2581 Secondary hyperparathyroidism of renal origin: Secondary | ICD-10-CM | POA: Diagnosis not present

## 2021-08-06 DIAGNOSIS — N186 End stage renal disease: Secondary | ICD-10-CM | POA: Diagnosis not present

## 2021-08-06 DIAGNOSIS — E1129 Type 2 diabetes mellitus with other diabetic kidney complication: Secondary | ICD-10-CM | POA: Diagnosis not present

## 2021-08-08 DIAGNOSIS — N2581 Secondary hyperparathyroidism of renal origin: Secondary | ICD-10-CM | POA: Diagnosis not present

## 2021-08-08 DIAGNOSIS — E1129 Type 2 diabetes mellitus with other diabetic kidney complication: Secondary | ICD-10-CM | POA: Diagnosis not present

## 2021-08-08 DIAGNOSIS — D689 Coagulation defect, unspecified: Secondary | ICD-10-CM | POA: Diagnosis not present

## 2021-08-08 DIAGNOSIS — N186 End stage renal disease: Secondary | ICD-10-CM | POA: Diagnosis not present

## 2021-08-08 DIAGNOSIS — Z992 Dependence on renal dialysis: Secondary | ICD-10-CM | POA: Diagnosis not present

## 2021-08-11 DIAGNOSIS — E1129 Type 2 diabetes mellitus with other diabetic kidney complication: Secondary | ICD-10-CM | POA: Diagnosis not present

## 2021-08-11 DIAGNOSIS — Z992 Dependence on renal dialysis: Secondary | ICD-10-CM | POA: Diagnosis not present

## 2021-08-11 DIAGNOSIS — E1122 Type 2 diabetes mellitus with diabetic chronic kidney disease: Secondary | ICD-10-CM | POA: Diagnosis not present

## 2021-08-11 DIAGNOSIS — N186 End stage renal disease: Secondary | ICD-10-CM | POA: Diagnosis not present

## 2021-08-11 DIAGNOSIS — D689 Coagulation defect, unspecified: Secondary | ICD-10-CM | POA: Diagnosis not present

## 2021-08-11 DIAGNOSIS — N2581 Secondary hyperparathyroidism of renal origin: Secondary | ICD-10-CM | POA: Diagnosis not present

## 2021-08-13 DIAGNOSIS — Z992 Dependence on renal dialysis: Secondary | ICD-10-CM | POA: Diagnosis not present

## 2021-08-13 DIAGNOSIS — D689 Coagulation defect, unspecified: Secondary | ICD-10-CM | POA: Diagnosis not present

## 2021-08-13 DIAGNOSIS — N186 End stage renal disease: Secondary | ICD-10-CM | POA: Diagnosis not present

## 2021-08-13 DIAGNOSIS — N2581 Secondary hyperparathyroidism of renal origin: Secondary | ICD-10-CM | POA: Diagnosis not present

## 2021-08-15 DIAGNOSIS — N2581 Secondary hyperparathyroidism of renal origin: Secondary | ICD-10-CM | POA: Diagnosis not present

## 2021-08-15 DIAGNOSIS — D689 Coagulation defect, unspecified: Secondary | ICD-10-CM | POA: Diagnosis not present

## 2021-08-15 DIAGNOSIS — Z992 Dependence on renal dialysis: Secondary | ICD-10-CM | POA: Diagnosis not present

## 2021-08-15 DIAGNOSIS — N186 End stage renal disease: Secondary | ICD-10-CM | POA: Diagnosis not present

## 2021-08-18 DIAGNOSIS — N186 End stage renal disease: Secondary | ICD-10-CM | POA: Diagnosis not present

## 2021-08-18 DIAGNOSIS — Z992 Dependence on renal dialysis: Secondary | ICD-10-CM | POA: Diagnosis not present

## 2021-08-18 DIAGNOSIS — D689 Coagulation defect, unspecified: Secondary | ICD-10-CM | POA: Diagnosis not present

## 2021-08-18 DIAGNOSIS — N2581 Secondary hyperparathyroidism of renal origin: Secondary | ICD-10-CM | POA: Diagnosis not present

## 2021-08-20 DIAGNOSIS — D689 Coagulation defect, unspecified: Secondary | ICD-10-CM | POA: Diagnosis not present

## 2021-08-20 DIAGNOSIS — N2581 Secondary hyperparathyroidism of renal origin: Secondary | ICD-10-CM | POA: Diagnosis not present

## 2021-08-20 DIAGNOSIS — N186 End stage renal disease: Secondary | ICD-10-CM | POA: Diagnosis not present

## 2021-08-20 DIAGNOSIS — Z992 Dependence on renal dialysis: Secondary | ICD-10-CM | POA: Diagnosis not present

## 2021-08-22 DIAGNOSIS — D689 Coagulation defect, unspecified: Secondary | ICD-10-CM | POA: Diagnosis not present

## 2021-08-22 DIAGNOSIS — N2581 Secondary hyperparathyroidism of renal origin: Secondary | ICD-10-CM | POA: Diagnosis not present

## 2021-08-22 DIAGNOSIS — Z992 Dependence on renal dialysis: Secondary | ICD-10-CM | POA: Diagnosis not present

## 2021-08-22 DIAGNOSIS — N186 End stage renal disease: Secondary | ICD-10-CM | POA: Diagnosis not present

## 2021-08-25 DIAGNOSIS — N2581 Secondary hyperparathyroidism of renal origin: Secondary | ICD-10-CM | POA: Diagnosis not present

## 2021-08-25 DIAGNOSIS — N186 End stage renal disease: Secondary | ICD-10-CM | POA: Diagnosis not present

## 2021-08-25 DIAGNOSIS — Z992 Dependence on renal dialysis: Secondary | ICD-10-CM | POA: Diagnosis not present

## 2021-08-25 DIAGNOSIS — D689 Coagulation defect, unspecified: Secondary | ICD-10-CM | POA: Diagnosis not present

## 2021-08-27 DIAGNOSIS — N186 End stage renal disease: Secondary | ICD-10-CM | POA: Diagnosis not present

## 2021-08-27 DIAGNOSIS — N2581 Secondary hyperparathyroidism of renal origin: Secondary | ICD-10-CM | POA: Diagnosis not present

## 2021-08-27 DIAGNOSIS — Z992 Dependence on renal dialysis: Secondary | ICD-10-CM | POA: Diagnosis not present

## 2021-08-27 DIAGNOSIS — D689 Coagulation defect, unspecified: Secondary | ICD-10-CM | POA: Diagnosis not present

## 2021-08-29 DIAGNOSIS — N2581 Secondary hyperparathyroidism of renal origin: Secondary | ICD-10-CM | POA: Diagnosis not present

## 2021-08-29 DIAGNOSIS — N186 End stage renal disease: Secondary | ICD-10-CM | POA: Diagnosis not present

## 2021-08-29 DIAGNOSIS — Z992 Dependence on renal dialysis: Secondary | ICD-10-CM | POA: Diagnosis not present

## 2021-08-29 DIAGNOSIS — D689 Coagulation defect, unspecified: Secondary | ICD-10-CM | POA: Diagnosis not present

## 2021-09-01 DIAGNOSIS — Z992 Dependence on renal dialysis: Secondary | ICD-10-CM | POA: Diagnosis not present

## 2021-09-01 DIAGNOSIS — N2581 Secondary hyperparathyroidism of renal origin: Secondary | ICD-10-CM | POA: Diagnosis not present

## 2021-09-01 DIAGNOSIS — N186 End stage renal disease: Secondary | ICD-10-CM | POA: Diagnosis not present

## 2021-09-01 DIAGNOSIS — D689 Coagulation defect, unspecified: Secondary | ICD-10-CM | POA: Diagnosis not present

## 2021-09-03 DIAGNOSIS — N2581 Secondary hyperparathyroidism of renal origin: Secondary | ICD-10-CM | POA: Diagnosis not present

## 2021-09-03 DIAGNOSIS — N186 End stage renal disease: Secondary | ICD-10-CM | POA: Diagnosis not present

## 2021-09-03 DIAGNOSIS — Z992 Dependence on renal dialysis: Secondary | ICD-10-CM | POA: Diagnosis not present

## 2021-09-03 DIAGNOSIS — D689 Coagulation defect, unspecified: Secondary | ICD-10-CM | POA: Diagnosis not present

## 2021-09-05 DIAGNOSIS — D689 Coagulation defect, unspecified: Secondary | ICD-10-CM | POA: Diagnosis not present

## 2021-09-05 DIAGNOSIS — N2581 Secondary hyperparathyroidism of renal origin: Secondary | ICD-10-CM | POA: Diagnosis not present

## 2021-09-05 DIAGNOSIS — N186 End stage renal disease: Secondary | ICD-10-CM | POA: Diagnosis not present

## 2021-09-05 DIAGNOSIS — Z992 Dependence on renal dialysis: Secondary | ICD-10-CM | POA: Diagnosis not present

## 2021-09-08 DIAGNOSIS — N2581 Secondary hyperparathyroidism of renal origin: Secondary | ICD-10-CM | POA: Diagnosis not present

## 2021-09-08 DIAGNOSIS — D689 Coagulation defect, unspecified: Secondary | ICD-10-CM | POA: Diagnosis not present

## 2021-09-08 DIAGNOSIS — E1122 Type 2 diabetes mellitus with diabetic chronic kidney disease: Secondary | ICD-10-CM | POA: Diagnosis not present

## 2021-09-08 DIAGNOSIS — N186 End stage renal disease: Secondary | ICD-10-CM | POA: Diagnosis not present

## 2021-09-08 DIAGNOSIS — Z992 Dependence on renal dialysis: Secondary | ICD-10-CM | POA: Diagnosis not present

## 2021-09-10 DIAGNOSIS — N186 End stage renal disease: Secondary | ICD-10-CM | POA: Diagnosis not present

## 2021-09-10 DIAGNOSIS — D631 Anemia in chronic kidney disease: Secondary | ICD-10-CM | POA: Diagnosis not present

## 2021-09-10 DIAGNOSIS — D689 Coagulation defect, unspecified: Secondary | ICD-10-CM | POA: Diagnosis not present

## 2021-09-10 DIAGNOSIS — N2581 Secondary hyperparathyroidism of renal origin: Secondary | ICD-10-CM | POA: Diagnosis not present

## 2021-09-10 DIAGNOSIS — Z992 Dependence on renal dialysis: Secondary | ICD-10-CM | POA: Diagnosis not present

## 2021-09-12 DIAGNOSIS — D689 Coagulation defect, unspecified: Secondary | ICD-10-CM | POA: Diagnosis not present

## 2021-09-12 DIAGNOSIS — N186 End stage renal disease: Secondary | ICD-10-CM | POA: Diagnosis not present

## 2021-09-12 DIAGNOSIS — N2581 Secondary hyperparathyroidism of renal origin: Secondary | ICD-10-CM | POA: Diagnosis not present

## 2021-09-12 DIAGNOSIS — D631 Anemia in chronic kidney disease: Secondary | ICD-10-CM | POA: Diagnosis not present

## 2021-09-12 DIAGNOSIS — Z992 Dependence on renal dialysis: Secondary | ICD-10-CM | POA: Diagnosis not present

## 2021-09-15 DIAGNOSIS — N2581 Secondary hyperparathyroidism of renal origin: Secondary | ICD-10-CM | POA: Diagnosis not present

## 2021-09-15 DIAGNOSIS — N186 End stage renal disease: Secondary | ICD-10-CM | POA: Diagnosis not present

## 2021-09-15 DIAGNOSIS — Z992 Dependence on renal dialysis: Secondary | ICD-10-CM | POA: Diagnosis not present

## 2021-09-15 DIAGNOSIS — D689 Coagulation defect, unspecified: Secondary | ICD-10-CM | POA: Diagnosis not present

## 2021-09-15 DIAGNOSIS — D631 Anemia in chronic kidney disease: Secondary | ICD-10-CM | POA: Diagnosis not present

## 2021-09-17 DIAGNOSIS — D689 Coagulation defect, unspecified: Secondary | ICD-10-CM | POA: Diagnosis not present

## 2021-09-17 DIAGNOSIS — Z992 Dependence on renal dialysis: Secondary | ICD-10-CM | POA: Diagnosis not present

## 2021-09-17 DIAGNOSIS — N186 End stage renal disease: Secondary | ICD-10-CM | POA: Diagnosis not present

## 2021-09-17 DIAGNOSIS — N2581 Secondary hyperparathyroidism of renal origin: Secondary | ICD-10-CM | POA: Diagnosis not present

## 2021-09-17 DIAGNOSIS — D631 Anemia in chronic kidney disease: Secondary | ICD-10-CM | POA: Diagnosis not present

## 2021-09-19 DIAGNOSIS — Z992 Dependence on renal dialysis: Secondary | ICD-10-CM | POA: Diagnosis not present

## 2021-09-19 DIAGNOSIS — N2581 Secondary hyperparathyroidism of renal origin: Secondary | ICD-10-CM | POA: Diagnosis not present

## 2021-09-19 DIAGNOSIS — D689 Coagulation defect, unspecified: Secondary | ICD-10-CM | POA: Diagnosis not present

## 2021-09-19 DIAGNOSIS — N186 End stage renal disease: Secondary | ICD-10-CM | POA: Diagnosis not present

## 2021-09-19 DIAGNOSIS — D631 Anemia in chronic kidney disease: Secondary | ICD-10-CM | POA: Diagnosis not present

## 2021-09-22 DIAGNOSIS — N186 End stage renal disease: Secondary | ICD-10-CM | POA: Diagnosis not present

## 2021-09-22 DIAGNOSIS — N2581 Secondary hyperparathyroidism of renal origin: Secondary | ICD-10-CM | POA: Diagnosis not present

## 2021-09-22 DIAGNOSIS — D631 Anemia in chronic kidney disease: Secondary | ICD-10-CM | POA: Diagnosis not present

## 2021-09-22 DIAGNOSIS — D689 Coagulation defect, unspecified: Secondary | ICD-10-CM | POA: Diagnosis not present

## 2021-09-22 DIAGNOSIS — Z992 Dependence on renal dialysis: Secondary | ICD-10-CM | POA: Diagnosis not present

## 2021-09-24 DIAGNOSIS — N2581 Secondary hyperparathyroidism of renal origin: Secondary | ICD-10-CM | POA: Diagnosis not present

## 2021-09-24 DIAGNOSIS — Z992 Dependence on renal dialysis: Secondary | ICD-10-CM | POA: Diagnosis not present

## 2021-09-24 DIAGNOSIS — D631 Anemia in chronic kidney disease: Secondary | ICD-10-CM | POA: Diagnosis not present

## 2021-09-24 DIAGNOSIS — D689 Coagulation defect, unspecified: Secondary | ICD-10-CM | POA: Diagnosis not present

## 2021-09-24 DIAGNOSIS — N186 End stage renal disease: Secondary | ICD-10-CM | POA: Diagnosis not present

## 2021-09-26 DIAGNOSIS — Z992 Dependence on renal dialysis: Secondary | ICD-10-CM | POA: Diagnosis not present

## 2021-09-26 DIAGNOSIS — N2581 Secondary hyperparathyroidism of renal origin: Secondary | ICD-10-CM | POA: Diagnosis not present

## 2021-09-26 DIAGNOSIS — D689 Coagulation defect, unspecified: Secondary | ICD-10-CM | POA: Diagnosis not present

## 2021-09-26 DIAGNOSIS — D631 Anemia in chronic kidney disease: Secondary | ICD-10-CM | POA: Diagnosis not present

## 2021-09-26 DIAGNOSIS — N186 End stage renal disease: Secondary | ICD-10-CM | POA: Diagnosis not present

## 2021-09-29 DIAGNOSIS — N186 End stage renal disease: Secondary | ICD-10-CM | POA: Diagnosis not present

## 2021-09-29 DIAGNOSIS — N2581 Secondary hyperparathyroidism of renal origin: Secondary | ICD-10-CM | POA: Diagnosis not present

## 2021-09-29 DIAGNOSIS — D631 Anemia in chronic kidney disease: Secondary | ICD-10-CM | POA: Diagnosis not present

## 2021-09-29 DIAGNOSIS — D689 Coagulation defect, unspecified: Secondary | ICD-10-CM | POA: Diagnosis not present

## 2021-09-29 DIAGNOSIS — Z992 Dependence on renal dialysis: Secondary | ICD-10-CM | POA: Diagnosis not present

## 2021-09-30 ENCOUNTER — Ambulatory Visit: Payer: Medicare Other | Admitting: Internal Medicine

## 2021-10-01 DIAGNOSIS — Z992 Dependence on renal dialysis: Secondary | ICD-10-CM | POA: Diagnosis not present

## 2021-10-01 DIAGNOSIS — D689 Coagulation defect, unspecified: Secondary | ICD-10-CM | POA: Diagnosis not present

## 2021-10-01 DIAGNOSIS — N186 End stage renal disease: Secondary | ICD-10-CM | POA: Diagnosis not present

## 2021-10-01 DIAGNOSIS — N2581 Secondary hyperparathyroidism of renal origin: Secondary | ICD-10-CM | POA: Diagnosis not present

## 2021-10-01 DIAGNOSIS — D631 Anemia in chronic kidney disease: Secondary | ICD-10-CM | POA: Diagnosis not present

## 2021-10-03 DIAGNOSIS — D689 Coagulation defect, unspecified: Secondary | ICD-10-CM | POA: Diagnosis not present

## 2021-10-03 DIAGNOSIS — N2581 Secondary hyperparathyroidism of renal origin: Secondary | ICD-10-CM | POA: Diagnosis not present

## 2021-10-03 DIAGNOSIS — Z992 Dependence on renal dialysis: Secondary | ICD-10-CM | POA: Diagnosis not present

## 2021-10-03 DIAGNOSIS — D631 Anemia in chronic kidney disease: Secondary | ICD-10-CM | POA: Diagnosis not present

## 2021-10-03 DIAGNOSIS — N186 End stage renal disease: Secondary | ICD-10-CM | POA: Diagnosis not present

## 2021-10-06 DIAGNOSIS — D689 Coagulation defect, unspecified: Secondary | ICD-10-CM | POA: Diagnosis not present

## 2021-10-06 DIAGNOSIS — N2581 Secondary hyperparathyroidism of renal origin: Secondary | ICD-10-CM | POA: Diagnosis not present

## 2021-10-06 DIAGNOSIS — D631 Anemia in chronic kidney disease: Secondary | ICD-10-CM | POA: Diagnosis not present

## 2021-10-06 DIAGNOSIS — N186 End stage renal disease: Secondary | ICD-10-CM | POA: Diagnosis not present

## 2021-10-06 DIAGNOSIS — Z992 Dependence on renal dialysis: Secondary | ICD-10-CM | POA: Diagnosis not present

## 2021-10-08 DIAGNOSIS — D689 Coagulation defect, unspecified: Secondary | ICD-10-CM | POA: Diagnosis not present

## 2021-10-08 DIAGNOSIS — N186 End stage renal disease: Secondary | ICD-10-CM | POA: Diagnosis not present

## 2021-10-08 DIAGNOSIS — D631 Anemia in chronic kidney disease: Secondary | ICD-10-CM | POA: Diagnosis not present

## 2021-10-08 DIAGNOSIS — N2581 Secondary hyperparathyroidism of renal origin: Secondary | ICD-10-CM | POA: Diagnosis not present

## 2021-10-08 DIAGNOSIS — Z992 Dependence on renal dialysis: Secondary | ICD-10-CM | POA: Diagnosis not present

## 2021-10-09 DIAGNOSIS — N186 End stage renal disease: Secondary | ICD-10-CM | POA: Diagnosis not present

## 2021-10-09 DIAGNOSIS — Z992 Dependence on renal dialysis: Secondary | ICD-10-CM | POA: Diagnosis not present

## 2021-10-09 DIAGNOSIS — E1122 Type 2 diabetes mellitus with diabetic chronic kidney disease: Secondary | ICD-10-CM | POA: Diagnosis not present

## 2021-10-10 DIAGNOSIS — N2581 Secondary hyperparathyroidism of renal origin: Secondary | ICD-10-CM | POA: Diagnosis not present

## 2021-10-10 DIAGNOSIS — D631 Anemia in chronic kidney disease: Secondary | ICD-10-CM | POA: Diagnosis not present

## 2021-10-10 DIAGNOSIS — D689 Coagulation defect, unspecified: Secondary | ICD-10-CM | POA: Diagnosis not present

## 2021-10-10 DIAGNOSIS — E1129 Type 2 diabetes mellitus with other diabetic kidney complication: Secondary | ICD-10-CM | POA: Diagnosis not present

## 2021-10-10 DIAGNOSIS — Z992 Dependence on renal dialysis: Secondary | ICD-10-CM | POA: Diagnosis not present

## 2021-10-10 DIAGNOSIS — N186 End stage renal disease: Secondary | ICD-10-CM | POA: Diagnosis not present

## 2021-10-12 ENCOUNTER — Encounter: Payer: Self-pay | Admitting: Internal Medicine

## 2021-10-12 ENCOUNTER — Telehealth: Payer: Self-pay

## 2021-10-12 ENCOUNTER — Ambulatory Visit (INDEPENDENT_AMBULATORY_CARE_PROVIDER_SITE_OTHER): Payer: Medicare Other

## 2021-10-12 ENCOUNTER — Ambulatory Visit (INDEPENDENT_AMBULATORY_CARE_PROVIDER_SITE_OTHER): Payer: Medicare Other | Admitting: Internal Medicine

## 2021-10-12 VITALS — BP 152/76 | HR 89 | Temp 97.9°F | Ht 72.0 in | Wt 179.0 lb

## 2021-10-12 DIAGNOSIS — E559 Vitamin D deficiency, unspecified: Secondary | ICD-10-CM | POA: Diagnosis not present

## 2021-10-12 DIAGNOSIS — E1129 Type 2 diabetes mellitus with other diabetic kidney complication: Secondary | ICD-10-CM

## 2021-10-12 DIAGNOSIS — N183 Chronic kidney disease, stage 3 unspecified: Secondary | ICD-10-CM | POA: Diagnosis not present

## 2021-10-12 DIAGNOSIS — E782 Mixed hyperlipidemia: Secondary | ICD-10-CM

## 2021-10-12 DIAGNOSIS — D631 Anemia in chronic kidney disease: Secondary | ICD-10-CM

## 2021-10-12 DIAGNOSIS — E538 Deficiency of other specified B group vitamins: Secondary | ICD-10-CM | POA: Diagnosis not present

## 2021-10-12 DIAGNOSIS — Z0001 Encounter for general adult medical examination with abnormal findings: Secondary | ICD-10-CM | POA: Diagnosis not present

## 2021-10-12 DIAGNOSIS — Z0000 Encounter for general adult medical examination without abnormal findings: Secondary | ICD-10-CM

## 2021-10-12 DIAGNOSIS — I10 Essential (primary) hypertension: Secondary | ICD-10-CM

## 2021-10-12 LAB — CBC WITH DIFFERENTIAL/PLATELET
Basophils Absolute: 0.1 10*3/uL (ref 0.0–0.1)
Basophils Relative: 1.1 % (ref 0.0–3.0)
Eosinophils Absolute: 0.6 10*3/uL (ref 0.0–0.7)
Eosinophils Relative: 6.6 % — ABNORMAL HIGH (ref 0.0–5.0)
HCT: 33.2 % — ABNORMAL LOW (ref 39.0–52.0)
Hemoglobin: 11.1 g/dL — ABNORMAL LOW (ref 13.0–17.0)
Lymphocytes Relative: 16.8 % (ref 12.0–46.0)
Lymphs Abs: 1.6 10*3/uL (ref 0.7–4.0)
MCHC: 33.6 g/dL (ref 30.0–36.0)
MCV: 100.3 fl — ABNORMAL HIGH (ref 78.0–100.0)
Monocytes Absolute: 0.7 10*3/uL (ref 0.1–1.0)
Monocytes Relative: 6.9 % (ref 3.0–12.0)
Neutro Abs: 6.7 10*3/uL (ref 1.4–7.7)
Neutrophils Relative %: 68.6 % (ref 43.0–77.0)
Platelets: 263 10*3/uL (ref 150.0–400.0)
RBC: 3.31 Mil/uL — ABNORMAL LOW (ref 4.22–5.81)
RDW: 15 % (ref 11.5–15.5)
WBC: 9.8 10*3/uL (ref 4.0–10.5)

## 2021-10-12 LAB — URINALYSIS, ROUTINE W REFLEX MICROSCOPIC
Bilirubin Urine: NEGATIVE
Ketones, ur: NEGATIVE
Leukocytes,Ua: NEGATIVE
Nitrite: NEGATIVE
Specific Gravity, Urine: 1.01 (ref 1.000–1.030)
Total Protein, Urine: 100 — AB
Urine Glucose: 500 — AB
Urobilinogen, UA: 0.2 (ref 0.0–1.0)
pH: 7 (ref 5.0–8.0)

## 2021-10-12 LAB — LIPID PANEL
Cholesterol: 155 mg/dL (ref 0–200)
HDL: 34.9 mg/dL — ABNORMAL LOW (ref >39.00)
LDL Cholesterol: 94 mg/dL (ref 0–99)
NonHDL: 120.16
Total CHOL/HDL Ratio: 4
Triglycerides: 131 mg/dL (ref 0.0–149.0)
VLDL: 26.2 mg/dL (ref 0.0–40.0)

## 2021-10-12 LAB — HEPATIC FUNCTION PANEL
ALT: 10 U/L (ref 0–53)
AST: 14 U/L (ref 0–37)
Albumin: 4.3 g/dL (ref 3.5–5.2)
Alkaline Phosphatase: 65 U/L (ref 39–117)
Bilirubin, Direct: 0.1 mg/dL (ref 0.0–0.3)
Total Bilirubin: 0.5 mg/dL (ref 0.2–1.2)
Total Protein: 7.8 g/dL (ref 6.0–8.3)

## 2021-10-12 LAB — BASIC METABOLIC PANEL
BUN: 40 mg/dL — ABNORMAL HIGH (ref 6–23)
CO2: 29 mEq/L (ref 19–32)
Calcium: 9.8 mg/dL (ref 8.4–10.5)
Chloride: 93 mEq/L — ABNORMAL LOW (ref 96–112)
Creatinine, Ser: 10.28 mg/dL (ref 0.40–1.50)
GFR: 4.94 mL/min — CL (ref >60.00)
Glucose, Bld: 149 mg/dL — ABNORMAL HIGH (ref 70–99)
Potassium: 4.6 mEq/L (ref 3.5–5.1)
Sodium: 134 mEq/L — ABNORMAL LOW (ref 135–145)

## 2021-10-12 LAB — VITAMIN B12: Vitamin B-12: 359 pg/mL (ref 211–911)

## 2021-10-12 LAB — PSA: PSA: 0.73 ng/mL (ref 0.10–4.00)

## 2021-10-12 LAB — TSH: TSH: 1.82 u[IU]/mL (ref 0.35–5.50)

## 2021-10-12 LAB — HEMOGLOBIN A1C: Hgb A1c MFr Bld: 6.4 % (ref 4.6–6.5)

## 2021-10-12 LAB — VITAMIN D 25 HYDROXY (VIT D DEFICIENCY, FRACTURES): VITD: 45.76 ng/mL (ref 30.00–100.00)

## 2021-10-12 NOTE — Assessment & Plan Note (Signed)

## 2021-10-12 NOTE — Progress Notes (Signed)
? ?Subjective:  ? Christopher Lopez is a 61 y.o. male who presents for an Initial Medicare Annual Wellness Visit. ? ?Review of Systems    ? ?Cardiac Risk Factors include: advanced age (>67mn, >>63women);diabetes mellitus;dyslipidemia;family history of premature cardiovascular disease;hypertension;male gender;smoking/ tobacco exposure ? ?   ?Objective:  ?  ?Today's Vitals  ? 10/12/21 1045  ?BP: (!) 152/76  ?Pulse: 89  ?Temp: 97.9 ?F (36.6 ?C)  ?SpO2: 99%  ?Weight: 179 lb (81.2 kg)  ?Height: 6' (1.829 m)  ?PainSc: 0-No pain  ? ?Body mass index is 24.28 kg/m?. ? ? ?  10/12/2021  ? 10:49 AM 07/26/2018  ?  8:42 AM 03/24/2018  ?  1:31 PM 03/01/2018  ?  7:34 AM 01/20/2018  ? 10:54 AM 12/23/2017  ? 11:15 AM 10/28/2017  ?  4:18 PM  ?Advanced Directives  ?Does Patient Have a Medical Advance Directive? No Yes Yes Yes Yes Yes No  ?Type of ACorporate treasurerof [place] will Healthcare Power of [place]  ?Does patient want to make changes to medical advance directive?      No - Patient declined   ?Copy of [place] Chart?  No - copy requested Yes Yes  Yes   ?Would patient like information on creating a medical advance directive? No - Patient declined No - Patient declined     No - Patient declined  ? ? ?Current Medications (verified) ?Outpatient Encounter Medications as of 10/12/2021  ?Medication Sig  ? acetaminophen (TYLENOL) 650 MG CR tablet Take by mouth.  ? aspirin 81 MG EC tablet Take by mouth.  ? cyanocobalamin 1000 MCG tablet Take by mouth.  ? diltiazem (CARDIZEM LA) 360 MG 24 hr tablet Take by mouth.  ? diphenhydrAMINE (BENADRYL) 25 mg capsule Take by mouth.  ? Doxercalciferol (HECTOROL IV) Doxercalciferol (Hectorol)  ? ferric citrate (AURYXIA) 1 GM 210 MG(Fe) tablet Take by mouth.  ? glucose blood (ONE TOUCH ULTRA TEST) test strip Use as instructed once daily E11.9  ? hydrALAZINE (APRESOLINE) 100  MG tablet Take by mouth.  ? ketoconazole (NIZORAL) 2 % cream Apply topically.  ? Lancets (ONETOUCH ULTRASOFT) lancets 1 each by Other route 2 (two) times daily. Use to check blood sugars once a day ?Dx e11.9  ? lovastatin (MEVACOR) 40 MG tablet Take 1 tablet (40 mg total) by mouth at bedtime.  ? Methoxy PEG-Epoetin Beta (MIRCERA IJ) Mircera  ? metoprolol succinate (TOPROL-XL) 50 MG 24 hr tablet TAKE 1 TABLET BY MOUTH  DAILY  ? Multiple Vitamin (MULTIVITAMIN ADULT PO) Take by mouth.  ? oxyCODONE-acetaminophen (PERCOCET) 5-325 MG tablet Take 1 tablet by mouth every 6 (six) hours as needed for severe pain.  ? pioglitazone (ACTOS) 15 MG tablet Take 1 tablet by mouth once daily  ? rosuvastatin (CRESTOR) 20 MG tablet Take 1 tablet by mouth once daily  ? sitaGLIPtin (JANUVIA) 100 MG tablet Take by mouth.  ? TRULICITY 1.5 MWC/3.7SESOPN INJECT 0.5 ML INTO THE SKIN ONCE A WEEK  ? ?No facility-administered encounter medications on file as of 10/12/2021.  ? ? ?Allergies (verified) ?Lipitor [atorvastatin]  ? ?History: ?Past Medical History:  ?Diagnosis Date  ? Alcohol abuse 09/18/2011  ? Allergic rhinitis, cause unspecified 09/20/2011  ? Anemia, unspecified 09/18/2011  ? Arthritis   ? Childhood asthma 09/20/2011  ? CKD (chronic kidney disease) stage 5, GFR less than 15 ml/min (HCC)   ?  Dialysis - T/Th/Sa  ? Colitis 09/18/2011  ? Dementia ([place]) 09/18/2011  ? Diabetes mellitus   ? Diverticulosis of colon without hemorrhage 02/27/2014  ? Foot ulcer ([place]) 09/18/2011  ? High cholesterol   ? HTN (hypertension) 09/18/2011  ? Hyperlipidemia 11/08/2011  ? Hypertension   ? Impaired glucose tolerance 09/18/2011  ? Pneumonia 09/18/2011  ? Pre-ulcerative corn or callous 11/20/2011  ? Stroke [facility])   ? left leg deficit  ? Type II or unspecified type diabetes mellitus without mention of complication, uncontrolled 09/20/2011  ? Wears glasses   ? ?Past Surgical History:  ?Procedure Laterality Date  ? AV FISTULA PLACEMENT Left 12/23/2017  ? Procedure: CREATION OF BASILIAC  - CEPHALIC  ARTERIOVENOUS (AV) FISTULA  STAGE ONE;  Surgeon: Waynetta Sandy, MD;  Location: [place];  Service: Vascular;  Laterality: Left;  ? AV FISTULA PLACEMENT Left 07/26/2018  ? Procedure: INSERTION OF ARTERIOVENOUS (AV) GORE-TEX GRAFT ARM;  Surgeon: Waynetta Sandy, MD;  Location: [place];  Service: Vascular;  Laterality: Left;  ? BASCILIC VEIN TRANSPOSITION Left 03/01/2018  ? Procedure: BASILIC VEIN TRANSPOSITION SECOND STAGE;  Surgeon: Waynetta Sandy, MD;  Location: [place];  Service: Vascular;  Laterality: Left;  ? INSERTION OF DIALYSIS CATHETER Right 12/23/2017  ? Procedure: INSERTION OF PALINDROME  DIALYSIS CATHETER;  Surgeon: Waynetta Sandy, MD;  Location: [place];  Service: Vascular;  Laterality: Right;  ? THROMBECTOMY BRACHIAL ARTERY Left 07/26/2018  ? Procedure: THROMBECTOMY LEFT UPPER EXTREMITY;  Surgeon: Waynetta Sandy, MD;  Location: [place];  Service: Vascular;  Laterality: Left;  ? TONSILLECTOMY  1985  ? VEIN REPAIR Left 07/26/2018  ? Procedure: STENT LEFT AXILLARY VEIN;  Surgeon: Waynetta Sandy, MD;  Location: [place];  Service: Vascular;  Laterality: Left;  ? ?Family History  ?Problem Relation Age of Onset  ? Prostate cancer Paternal Grandfather   ? Diabetes Mother   ? Heart disease Mother   ? Transient ischemic attack Mother   ? Diabetes Sister   ? Diabetes Maternal Grandfather   ? Colon cancer Neg Hx   ? ?Social History  ? ?Socioeconomic History  ? Marital status: Single  ?  Spouse name: Not on file  ? Number of children: Not on file  ? Years of education: Not on file  ? Highest education level: Not on file  ?Occupational History  ? Not on file  ?Tobacco Use  ? Smoking status: Former  ?  Packs/day: 0.50  ?  Types: Cigarettes  ? Smokeless tobacco: Never  ?Vaping Use  ? Vaping Use: Never used  ?Substance and Sexual Activity  ? Alcohol use: Not Currently  ?  Alcohol/week: 2.0 standard drinks  ?  Types: 2 Cans of beer per week  ? Drug use: No  ?  Sexual activity: Not on file  ?Other Topics Concern  ? Not on file  ?Social History Narrative  ? Not on file  ? ?Social Determinants of Health  ? ?Financial Resource Strain: Low Risk   ? Difficulty of Paying Living Expenses: Not hard at all  ?Food Insecurity: No Food Insecurity  ? Worried About Charity fundraiser in the Last Year: Never true  ? Ran Out of Food in the Last Year: Never true  ?Transportation Needs: No Transportation Needs  ? Lack of Transportation (Medical): No  ? Lack of Transportation (Non-Medical): No  ?Physical Activity: Inactive  ? Days of Exercise per Week: 0 days  ? Minutes of Exercise per Session: 0  min  ?Stress: No Stress Concern Present  ? Feeling of Stress : Not at all  ?Social Connections: Moderately Integrated  ? Frequency of Communication with Friends and Family: More than three times a week  ? Frequency of Social Gatherings with Friends and Family: More than three times a week  ? Attends Religious Services: More than 4 times per year  ? Active Member of Clubs or Organizations: Yes  ? Attends Archivist Meetings: More than 4 times per year  ? Marital Status: Never married  ? ? ?Tobacco Counseling ?Counseling given: Not Answered ? ? ?Clinical Intake: ? ?Pre-visit preparation completed: Yes ? ?Pain : No/denies pain ?Pain Score: 0-No pain ? ?  ? ?BMI - recorded: 24.28 ?Nutritional Status: BMI of 19-24  Normal ?Nutritional Risks: None ?Diabetes: Yes ?CBG done?: No ?Did pt. bring in CBG monitor from home?: No ? ?How often do you need to have someone help you when you read instructions, pamphlets, or other written materials from your doctor or pharmacy?: 1 - Never ?What is the last grade level you completed in school?: HSG ? ?Diabetic? yes ? ?Interpreter Needed?: No ? ?Information entered by :: Lisette Abu, LPN ? ? ?Activities of Daily Living ? ?  10/12/2021  ? 10:50 AM  ?In your present state of health, do you have any difficulty performing the following activities:  ?Hearing? 1   ?Comment Bilaeral hearing loss  ?Vision? 0  ?Difficulty concentrating or making decisions? 1  ?Comment dementia  ?Walking or climbing stairs? 1  ?Comment cane  ?Dressing or bathing? 0  ?Doing errands, s

## 2021-10-12 NOTE — Assessment & Plan Note (Signed)
Lab Results  ?Component Value Date  ? HGBA1C 6.4 10/12/2021  ? ?Stable, pt to continue current medical treatment actos, januvia, trulicity ? ?

## 2021-10-12 NOTE — Progress Notes (Signed)
Patient ID: Christopher Lopez, male   DOB: 1960/12/22, 61 y.o.   MRN: [id number] ? ? ? ?     Chief Complaint:: wellness exam and Follow-up (6 month f/u) ? With son - htn, dm, anemia ? ?     HPI:  Christopher Lopez is a 61 y.o. male here for wellness exam; declines covid booster, shingrix, ow up to date ?         ?              Also HD is tues-thur-sat.  [place] with cane. Pt denies chest pain, increased sob or doe, wheezing, orthopnea, PND, increased LE swelling, palpitations, dizziness or syncope.   Pt denies polydipsia, polyuria, or new focal neuro s/s.    Pt denies fever, wt loss, night sweats, loss of appetite, or other constitutional symptoms  did not take BP meds this am yet.   ? ?Wt Readings from Last 3 Encounters:  ?10/12/21 179 lb (81.2 kg)  ?10/12/21 179 lb (81.2 kg)  ?04/01/21 169 lb (76.7 kg)  ? ?BP Readings from Last 3 Encounters:  ?10/12/21 (!) 152/76  ?10/12/21 (!) 152/76  ?04/01/21 138/70  ? ?Immunization History  ?Administered Date(s) Administered  ? Hepatitis B, adult 01/10/2018, 02/11/2018, 03/14/2018, 07/10/2018  ? Influenza,inj,Quad PF,6+ Mos 02/27/2014, 08/22/2015, 09/06/2018, 03/09/2019, 05/06/2020, 04/01/2021, 04/21/2021  ? Moderna Sars-Covid-2 Vaccination 09/20/2019, 10/25/2019  ? Pneumococcal Conjugate-13 09/06/2018, 10/31/2018  ? Pneumococcal Polysaccharide-23 08/20/2014, 08/22/2015  ? Tdap 09/05/2012  ? ?There are no preventive care reminders to display for this patient. ? ?  ? ?Past Medical History:  ?Diagnosis Date  ? Alcohol abuse 09/18/2011  ? Allergic rhinitis, cause unspecified 09/20/2011  ? Anemia, unspecified 09/18/2011  ? Arthritis   ? Childhood asthma 09/20/2011  ? CKD (chronic kidney disease) stage 5, GFR less than 15 ml/min (HCC)   ? Dialysis - T/Th/Sa  ? Colitis 09/18/2011  ? Dementia ([place]) 09/18/2011  ? Diabetes mellitus   ? Diverticulosis of colon without hemorrhage 02/27/2014  ? Foot ulcer ([place]) 09/18/2011  ? High cholesterol   ? HTN (hypertension) 09/18/2011  ? Hyperlipidemia 11/08/2011  ?  Hypertension   ? Impaired glucose tolerance 09/18/2011  ? Pneumonia 09/18/2011  ? Pre-ulcerative corn or callous 11/20/2011  ? Stroke [facility])   ? left leg deficit  ? Type II or unspecified type diabetes mellitus without mention of complication, uncontrolled 09/20/2011  ? Wears glasses   ? ?Past Surgical History:  ?Procedure Laterality Date  ? AV FISTULA PLACEMENT Left 12/23/2017  ? Procedure: CREATION OF BASILIAC - CEPHALIC  ARTERIOVENOUS (AV) FISTULA  STAGE ONE;  Surgeon: Waynetta Sandy, MD;  Location: [place];  Service: Vascular;  Laterality: Left;  ? AV FISTULA PLACEMENT Left 07/26/2018  ? Procedure: INSERTION OF ARTERIOVENOUS (AV) GORE-TEX GRAFT ARM;  Surgeon: Waynetta Sandy, MD;  Location: [place];  Service: Vascular;  Laterality: Left;  ? BASCILIC VEIN TRANSPOSITION Left 03/01/2018  ? Procedure: BASILIC VEIN TRANSPOSITION SECOND STAGE;  Surgeon: Waynetta Sandy, MD;  Location: [place];  Service: Vascular;  Laterality: Left;  ? INSERTION OF DIALYSIS CATHETER Right 12/23/2017  ? Procedure: INSERTION OF PALINDROME  DIALYSIS CATHETER;  Surgeon: Waynetta Sandy, MD;  Location: [place];  Service: Vascular;  Laterality: Right;  ? THROMBECTOMY BRACHIAL ARTERY Left 07/26/2018  ? Procedure: THROMBECTOMY LEFT UPPER EXTREMITY;  Surgeon: Waynetta Sandy, MD;  Location: [place];  Service: Vascular;  Laterality: Left;  ? TONSILLECTOMY  1985  ? VEIN REPAIR Left 07/26/2018  ?  Procedure: STENT LEFT AXILLARY VEIN;  Surgeon: Waynetta Sandy, MD;  Location: [place];  Service: Vascular;  Laterality: Left;  ? ? reports that he has quit smoking. His smoking use included cigarettes. He smoked an average of .5 packs per day. He has never used smokeless tobacco. He reports that he does not currently use alcohol after a past usage of about 2.0 standard drinks per week. He reports that he does not use drugs. ?family history includes Diabetes in his maternal grandfather, mother, and sister; Heart disease  in his mother; Prostate cancer in his paternal grandfather; Transient ischemic attack in his mother. ?Allergies  ?Allergen Reactions  ? Lipitor [Atorvastatin] Other (See Comments)  ? ?Current Outpatient Medications on File Prior to Visit  ?Medication Sig Dispense Refill  ? acetaminophen (TYLENOL) 650 MG CR tablet Take by mouth.    ? aspirin 81 MG EC tablet Take by mouth.    ? cyanocobalamin 1000 MCG tablet Take by mouth.    ? diltiazem (CARDIZEM LA) 360 MG 24 hr tablet Take by mouth.    ? diphenhydrAMINE (BENADRYL) 25 mg capsule Take by mouth.    ? Doxercalciferol (HECTOROL IV) Doxercalciferol (Hectorol)    ? ferric citrate (AURYXIA) 1 GM 210 MG(Fe) tablet Take by mouth.    ? glucose blood (ONE TOUCH ULTRA TEST) test strip Use as instructed once daily E11.9 100 each 12  ? hydrALAZINE (APRESOLINE) 100 MG tablet Take by mouth.    ? ketoconazole (NIZORAL) 2 % cream Apply topically.    ? Lancets (ONETOUCH ULTRASOFT) lancets 1 each by Other route 2 (two) times daily. Use to check blood sugars once a day ?Dx e11.9 100 each 3  ? lovastatin (MEVACOR) 40 MG tablet Take 1 tablet (40 mg total) by mouth at bedtime. 90 tablet 2  ? Methoxy PEG-Epoetin Beta (MIRCERA IJ) Mircera    ? metoprolol succinate (TOPROL-XL) 50 MG 24 hr tablet TAKE 1 TABLET BY MOUTH  DAILY 90 tablet 3  ? Multiple Vitamin (MULTIVITAMIN ADULT PO) Take by mouth.    ? oxyCODONE-acetaminophen (PERCOCET) 5-325 MG tablet Take 1 tablet by mouth every 6 (six) hours as needed for severe pain. 20 tablet 0  ? pioglitazone (ACTOS) 15 MG tablet Take 1 tablet by mouth once daily 90 tablet 0  ? rosuvastatin (CRESTOR) 20 MG tablet Take 1 tablet by mouth once daily 90 tablet 3  ? sitaGLIPtin (JANUVIA) 100 MG tablet Take by mouth.    ? TRULICITY 1.5 PX/1.0GY SOPN INJECT 0.5 ML INTO THE SKIN ONCE A WEEK 8 mL 0  ? ?No current facility-administered medications on file prior to visit.  ? ?     ROS:  All others reviewed and negative. ? ?Objective  ? ?     PE:  BP (!) 152/76 (BP  Location: Right Arm, Patient Position: Sitting, Cuff Size: Normal)   Pulse 89   Temp 97.9 ?F (36.6 ?C) (Oral)   Ht 6' (1.829 m)   Wt 179 lb (81.2 kg)   SpO2 99%   BMI 24.28 kg/m?  ? ?              Constitutional: Pt appears in NAD ?              HENT: Head: NCAT.  ?              Right Ear: External ear normal.   ?              Left Ear:  External ear normal.  ?              Eyes: . Pupils are equal, round, and reactive to light. Conjunctivae and EOM are normal ?              Nose: without d/c or deformity ?              Neck: Neck supple. Gross normal ROM ?              Cardiovascular: Normal rate and regular rhythm.   ?              Pulmonary/Chest: Effort normal and breath sounds without rales or wheezing.  ?              Abd:  Soft, NT, ND, + BS, no organomegaly ?              Neurological: Pt is alert. At baseline orientation, motor grossly intact ?              Skin: Skin is warm. No rashes, no other new lesions, LE edema - none ?              Psychiatric: Pt behavior is normal without agitation  ? ?Micro: none ? ?Cardiac tracings I have personally interpreted today:  none ? ?Pertinent Radiological findings (summarize): none  ? ?Lab Results  ?Component Value Date  ? WBC 9.8 10/12/2021  ? HGB 11.1 (L) 10/12/2021  ? HCT 33.2 (L) 10/12/2021  ? PLT 263.0 10/12/2021  ? GLUCOSE 149 (H) 10/12/2021  ? CHOL 155 10/12/2021  ? TRIG 131.0 10/12/2021  ? HDL 34.90 (L) 10/12/2021  ? LDLDIRECT 131.0 08/11/2017  ? [place] 94 10/12/2021  ? ALT 10 10/12/2021  ? AST 14 10/12/2021  ? NA 134 (L) 10/12/2021  ? K 4.6 10/12/2021  ? CL 93 (L) 10/12/2021  ? CREATININE 10.28 (HH) 10/12/2021  ? BUN 40 (H) 10/12/2021  ? CO2 29 10/12/2021  ? TSH 1.82 10/12/2021  ? PSA 0.73 10/12/2021  ? HGBA1C 6.4 10/12/2021  ? MICROALBUR 98.3 (H) 08/11/2017  ? ?Assessment/Plan:  ?Christopher Lopez is a 61 y.o. Black or African American [2] male with  has a past medical history of Alcohol abuse (09/18/2011), Allergic rhinitis, cause unspecified (09/20/2011),  Anemia, unspecified (09/18/2011), Arthritis, Childhood asthma (09/20/2011), CKD (chronic kidney disease) stage 5, GFR less than 15 ml/min ([place]), Colitis (09/18/2011), Dementia ([place]) (09/18/2011), Diabetes mellitus, [place]

## 2021-10-12 NOTE — Telephone Encounter (Signed)
CRITICAL VALUE STICKER ? ?CRITICAL VALUE: creatinine at 10.28 and GFR at 4.94 ? ?RECEIVER (on-site recipient of call): Lovena Le A. Alroy Dust, CMA ? ?DATE & TIME NOTIFIED: 10/12/2021 at 1:02p ? ?MESSENGER (representative from lab): Hope with [facility] ? ?MD NOTIFIED: yes ? ?TIME OF NOTIFICATION: 1:02p ? ?RESPONSE: waiting for response ? ?

## 2021-10-12 NOTE — Assessment & Plan Note (Signed)
Lab Results  ?Component Value Date  ? [employer] 94 10/12/2021  ? ?Stable, pt to continue current statin crestor ? ?

## 2021-10-12 NOTE — Patient Instructions (Signed)
Christopher Lopez , ?Thank you for taking time to come for your Medicare Wellness Visit. I appreciate your ongoing commitment to your health goals. Please review the following plan we discussed and let me know if I can assist you in the future.  ? ?Screening recommendations/referrals: ?Colonoscopy: 10/11/2013; due every 10 years (due 10/12/2023) ?Recommended yearly ophthalmology/optometry visit for glaucoma screening and checkup ?Recommended yearly dental visit for hygiene and checkup ? ?Vaccinations: ?Influenza vaccine: 04/01/2021; due every Fall Season ?Pneumococcal vaccine: 08/22/2015, 10/31/2018 ?Tdap vaccine: 09/05/2012; due every 10 years ?Shingles vaccine: never done   ?Covid-19: 09/20/2019, 10/25/2019 ? ?Advanced directives: Advance directive discussed with you today. Even though you declined this today please call our office should you change your mind and we can give you the proper paperwork for you to fill out. ? ?Conditions/risks identified: Yes; Patient declined health goal at this time. ? ?Next appointment: Please schedule your next Medicare Wellness Visit with your Nurse Health Advisor in 1 year or 366 days by calling [phone]. ? ?Preventive Care 40-64 Years, Male ?Preventive care refers to lifestyle choices and visits with your health care provider that can promote health and wellness. ?What does preventive care include? ?A yearly physical exam. This is also called an annual well check. ?Dental exams once or twice a year. ?Routine eye exams. Ask your health care provider how often you should have your eyes checked. ?Personal lifestyle choices, including: ?Daily care of your teeth and gums. ?Regular physical activity. ?Eating a healthy diet. ?Avoiding tobacco and drug use. ?Limiting alcohol use. ?Practicing safe sex. ?Taking low-dose aspirin every day starting at age 31. ?What happens during an annual well check? ?The services and screenings done by your health care provider during your annual well check will  depend on your age, overall health, lifestyle risk factors, and family history of disease. ?Counseling  ?Your health care provider may ask you questions about your: ?Alcohol use. ?Tobacco use. ?Drug use. ?Emotional well-being. ?Home and relationship well-being. ?Sexual activity. ?Eating habits. ?Work and work Statistician. ?Screening  ?You may have the following tests or measurements: ?Height, weight, and BMI. ?Blood pressure. ?Lipid and cholesterol levels. These may be checked every 5 years, or more frequently if you are over 51 years old. ?Skin check. ?Lung cancer screening. You may have this screening every year starting at age 5 if you have a 30-pack-year history of smoking and currently smoke or have quit within the past 15 years. ?Fecal occult blood test (FOBT) of the stool. You may have this test every year starting at age 72. ?Flexible sigmoidoscopy or colonoscopy. You may have a sigmoidoscopy every 5 years or a colonoscopy every 10 years starting at age 35. ?Prostate cancer screening. Recommendations will vary depending on your family history and other risks. ?Hepatitis C blood test. ?Hepatitis B blood test. ?Sexually transmitted disease (STD) testing. ?Diabetes screening. This is done by checking your blood sugar (glucose) after you have not eaten for a while (fasting). You may have this done every 1-3 years. ?Discuss your test results, treatment options, and if necessary, the need for more tests with your health care provider. ?Vaccines  ?Your health care provider may recommend certain vaccines, such as: ?Influenza vaccine. This is recommended every year. ?Tetanus, diphtheria, and acellular pertussis (Tdap, Td) vaccine. You may need a Td booster every 10 years. ?Zoster vaccine. You may need this after age 61. ?Pneumococcal 13-valent conjugate (PCV13) vaccine. You may need this if you have certain conditions and have not been vaccinated. ?Pneumococcal polysaccharide (PPSV23)  vaccine. You may need one or  two doses if you smoke cigarettes or if you have certain conditions. ?Talk to your health care provider about which screenings and vaccines you need and how often you need them. ?This information is not intended to replace advice given to you by your health care provider. Make sure you discuss any questions you have with your health care provider. ?Document Released: 07/25/2015 Document Revised: 03/17/2016 Document Reviewed: 04/29/2015 ?Elsevier Interactive Patient Education ? 2017 [place]. ? ?Fall Prevention in the Home ?Falls can cause injuries. They can happen to people of all ages. There are many things you can do to make your home safe and to help prevent falls. ?What can I do on the outside of my home? ?Regularly fix the edges of walkways and driveways and fix any cracks. ?Remove anything that might make you trip as you walk through a door, such as a raised step or threshold. ?Trim any bushes or trees on the path to your home. ?Use bright outdoor lighting. ?Clear any walking paths of anything that might make someone trip, such as rocks or tools. ?Regularly check to see if handrails are loose or broken. Make sure that both sides of any steps have handrails. ?Any raised decks and porches should have guardrails on the edges. ?Have any leaves, snow, or ice cleared regularly. ?Use sand or salt on walking paths during winter. ?Clean up any spills in your garage right away. This includes oil or grease spills. ?What can I do in the bathroom? ?Use night lights. ?Install grab bars by the toilet and in the tub and shower. Do not use towel bars as grab bars. ?Use non-skid mats or decals in the tub or shower. ?If you need to sit down in the shower, use a plastic, non-slip stool. ?Keep the floor dry. Clean up any water that spills on the floor as soon as it happens. ?Remove soap buildup in the tub or shower regularly. ?Attach bath mats securely with double-sided non-slip rug tape. ?Do not have throw rugs and other  things on the floor that can make you trip. ?What can I do in the bedroom? ?Use night lights. ?Make sure that you have a light by your bed that is easy to reach. ?Do not use any sheets or blankets that are too big for your bed. They should not hang down onto the floor. ?Have a firm chair that has side arms. You can use this for support while you get dressed. ?Do not have throw rugs and other things on the floor that can make you trip. ?What can I do in the kitchen? ?Clean up any spills right away. ?Avoid walking on wet floors. ?Keep items that you use a lot in easy-to-reach places. ?If you need to reach something above you, use a strong step stool that has a grab bar. ?Keep electrical cords out of the way. ?Do not use floor polish or wax that makes floors slippery. If you must use wax, use non-skid floor wax. ?Do not have throw rugs and other things on the floor that can make you trip. ?What can I do with my stairs? ?Do not leave any items on the stairs. ?Make sure that there are handrails on both sides of the stairs and use them. Fix handrails that are broken or loose. Make sure that handrails are as long as the stairways. ?Check any carpeting to make sure that it is firmly attached to the stairs. Fix any carpet that is loose or  worn. ?Avoid having throw rugs at the top or bottom of the stairs. If you do have throw rugs, attach them to the floor with carpet tape. ?Make sure that you have a light switch at the top of the stairs and the bottom of the stairs. If you do not have them, ask someone to add them for you. ?What else can I do to help prevent falls? ?Wear shoes that: ?Do not have high heels. ?Have rubber bottoms. ?Are comfortable and fit you well. ?Are closed at the toe. Do not wear sandals. ?If you use a stepladder: ?Make sure that it is fully opened. Do not climb a closed stepladder. ?Make sure that both sides of the stepladder are locked into place. ?Ask someone to hold it for you, if possible. ?Clearly  mark and make sure that you can see: ?Any grab bars or handrails. ?First and last steps. ?Where the edge of each step is. ?Use tools that help you move around (mobility aids) if they are needed. These include:

## 2021-10-12 NOTE — Assessment & Plan Note (Signed)
BP Readings from Last 3 Encounters:  ?10/12/21 (!) 152/76  ?10/12/21 (!) 152/76  ?04/01/21 138/70  ? ?Uncontrolled, pt states did not take BP med this am, pt to restart medical treatment cardizem, apresoline, toprol ? ?

## 2021-10-12 NOTE — Patient Instructions (Signed)
Please consider the Shingrix shingles shot if covered by the insurance.  ? ?Please continue all other medications as before, and refills have been done if requested. ? ?Please have the pharmacy call with any other refills you may need. ? ?Please continue your efforts at being more active, low cholesterol diet, and weight control. ? ?You are otherwise up to date with prevention measures today. ? ?Please keep your appointments with your specialists as you may have planned ? ?Please go to the LAB at the blood drawing area for the tests to be done ? ?You will be contacted by phone if any changes need to be made immediately.  Otherwise, you will receive a letter about your results with an explanation, but please check with MyChart first. ? ?Please remember to sign up for MyChart if you have not done so, as this will be important to you in the future with finding out test results, communicating by private email, and scheduling acute appointments online when needed. ? ?Please make an Appointment to return in 6 months, or sooner if needed ? ?

## 2021-10-12 NOTE — Assessment & Plan Note (Signed)
Stable overall,  ?Lab Results  ?Component Value Date  ? WBC 9.8 10/12/2021  ? HGB 11.1 (L) 10/12/2021  ? HCT 33.2 (L) 10/12/2021  ? MCV 100.3 (H) 10/12/2021  ? PLT 263.0 10/12/2021  ?cont to follow ?

## 2021-10-13 DIAGNOSIS — D631 Anemia in chronic kidney disease: Secondary | ICD-10-CM | POA: Diagnosis not present

## 2021-10-13 DIAGNOSIS — N2581 Secondary hyperparathyroidism of renal origin: Secondary | ICD-10-CM | POA: Diagnosis not present

## 2021-10-13 DIAGNOSIS — N186 End stage renal disease: Secondary | ICD-10-CM | POA: Diagnosis not present

## 2021-10-13 DIAGNOSIS — Z992 Dependence on renal dialysis: Secondary | ICD-10-CM | POA: Diagnosis not present

## 2021-10-13 DIAGNOSIS — D689 Coagulation defect, unspecified: Secondary | ICD-10-CM | POA: Diagnosis not present

## 2021-10-13 DIAGNOSIS — E1129 Type 2 diabetes mellitus with other diabetic kidney complication: Secondary | ICD-10-CM | POA: Diagnosis not present

## 2021-10-15 DIAGNOSIS — N2581 Secondary hyperparathyroidism of renal origin: Secondary | ICD-10-CM | POA: Diagnosis not present

## 2021-10-15 DIAGNOSIS — D631 Anemia in chronic kidney disease: Secondary | ICD-10-CM | POA: Diagnosis not present

## 2021-10-15 DIAGNOSIS — N186 End stage renal disease: Secondary | ICD-10-CM | POA: Diagnosis not present

## 2021-10-15 DIAGNOSIS — E1129 Type 2 diabetes mellitus with other diabetic kidney complication: Secondary | ICD-10-CM | POA: Diagnosis not present

## 2021-10-15 DIAGNOSIS — D689 Coagulation defect, unspecified: Secondary | ICD-10-CM | POA: Diagnosis not present

## 2021-10-15 DIAGNOSIS — Z992 Dependence on renal dialysis: Secondary | ICD-10-CM | POA: Diagnosis not present

## 2021-10-17 DIAGNOSIS — D689 Coagulation defect, unspecified: Secondary | ICD-10-CM | POA: Diagnosis not present

## 2021-10-17 DIAGNOSIS — E1129 Type 2 diabetes mellitus with other diabetic kidney complication: Secondary | ICD-10-CM | POA: Diagnosis not present

## 2021-10-17 DIAGNOSIS — N2581 Secondary hyperparathyroidism of renal origin: Secondary | ICD-10-CM | POA: Diagnosis not present

## 2021-10-17 DIAGNOSIS — D631 Anemia in chronic kidney disease: Secondary | ICD-10-CM | POA: Diagnosis not present

## 2021-10-17 DIAGNOSIS — N186 End stage renal disease: Secondary | ICD-10-CM | POA: Diagnosis not present

## 2021-10-17 DIAGNOSIS — Z992 Dependence on renal dialysis: Secondary | ICD-10-CM | POA: Diagnosis not present

## 2021-10-20 DIAGNOSIS — E1129 Type 2 diabetes mellitus with other diabetic kidney complication: Secondary | ICD-10-CM | POA: Diagnosis not present

## 2021-10-20 DIAGNOSIS — Z992 Dependence on renal dialysis: Secondary | ICD-10-CM | POA: Diagnosis not present

## 2021-10-20 DIAGNOSIS — D689 Coagulation defect, unspecified: Secondary | ICD-10-CM | POA: Diagnosis not present

## 2021-10-20 DIAGNOSIS — D631 Anemia in chronic kidney disease: Secondary | ICD-10-CM | POA: Diagnosis not present

## 2021-10-20 DIAGNOSIS — N186 End stage renal disease: Secondary | ICD-10-CM | POA: Diagnosis not present

## 2021-10-20 DIAGNOSIS — N2581 Secondary hyperparathyroidism of renal origin: Secondary | ICD-10-CM | POA: Diagnosis not present

## 2021-10-22 DIAGNOSIS — N186 End stage renal disease: Secondary | ICD-10-CM | POA: Diagnosis not present

## 2021-10-22 DIAGNOSIS — D631 Anemia in chronic kidney disease: Secondary | ICD-10-CM | POA: Diagnosis not present

## 2021-10-22 DIAGNOSIS — E1129 Type 2 diabetes mellitus with other diabetic kidney complication: Secondary | ICD-10-CM | POA: Diagnosis not present

## 2021-10-22 DIAGNOSIS — D689 Coagulation defect, unspecified: Secondary | ICD-10-CM | POA: Diagnosis not present

## 2021-10-22 DIAGNOSIS — Z992 Dependence on renal dialysis: Secondary | ICD-10-CM | POA: Diagnosis not present

## 2021-10-22 DIAGNOSIS — N2581 Secondary hyperparathyroidism of renal origin: Secondary | ICD-10-CM | POA: Diagnosis not present

## 2021-10-24 DIAGNOSIS — E1129 Type 2 diabetes mellitus with other diabetic kidney complication: Secondary | ICD-10-CM | POA: Diagnosis not present

## 2021-10-24 DIAGNOSIS — N2581 Secondary hyperparathyroidism of renal origin: Secondary | ICD-10-CM | POA: Diagnosis not present

## 2021-10-24 DIAGNOSIS — N186 End stage renal disease: Secondary | ICD-10-CM | POA: Diagnosis not present

## 2021-10-24 DIAGNOSIS — D631 Anemia in chronic kidney disease: Secondary | ICD-10-CM | POA: Diagnosis not present

## 2021-10-24 DIAGNOSIS — Z992 Dependence on renal dialysis: Secondary | ICD-10-CM | POA: Diagnosis not present

## 2021-10-24 DIAGNOSIS — D689 Coagulation defect, unspecified: Secondary | ICD-10-CM | POA: Diagnosis not present

## 2021-10-27 DIAGNOSIS — Z992 Dependence on renal dialysis: Secondary | ICD-10-CM | POA: Diagnosis not present

## 2021-10-27 DIAGNOSIS — N2581 Secondary hyperparathyroidism of renal origin: Secondary | ICD-10-CM | POA: Diagnosis not present

## 2021-10-27 DIAGNOSIS — D689 Coagulation defect, unspecified: Secondary | ICD-10-CM | POA: Diagnosis not present

## 2021-10-27 DIAGNOSIS — E1129 Type 2 diabetes mellitus with other diabetic kidney complication: Secondary | ICD-10-CM | POA: Diagnosis not present

## 2021-10-27 DIAGNOSIS — D631 Anemia in chronic kidney disease: Secondary | ICD-10-CM | POA: Diagnosis not present

## 2021-10-27 DIAGNOSIS — N186 End stage renal disease: Secondary | ICD-10-CM | POA: Diagnosis not present

## 2021-10-28 ENCOUNTER — Ambulatory Visit (INDEPENDENT_AMBULATORY_CARE_PROVIDER_SITE_OTHER): Payer: Medicare Other | Admitting: Podiatry

## 2021-10-28 ENCOUNTER — Encounter: Payer: Self-pay | Admitting: Podiatry

## 2021-10-28 DIAGNOSIS — E1142 Type 2 diabetes mellitus with diabetic polyneuropathy: Secondary | ICD-10-CM

## 2021-10-28 DIAGNOSIS — D689 Coagulation defect, unspecified: Secondary | ICD-10-CM

## 2021-10-28 DIAGNOSIS — B351 Tinea unguium: Secondary | ICD-10-CM

## 2021-10-28 DIAGNOSIS — M79676 Pain in unspecified toe(s): Secondary | ICD-10-CM

## 2021-10-28 DIAGNOSIS — N186 End stage renal disease: Secondary | ICD-10-CM

## 2021-10-28 DIAGNOSIS — Q828 Other specified congenital malformations of skin: Secondary | ICD-10-CM

## 2021-10-28 NOTE — Progress Notes (Signed)
This patient returns to my office for at risk foot care.  This patient requires this care by a professional since this patient will be at risk due to having  ESRD, CKD and diabetes.  This patient is unable to cut nails himself since the patient cannot reach his nails.These nails are painful walking and wearing shoes.  This patient presents for at risk foot care today.  General Appearance  Alert, conversant and in no acute stress.  Vascular  Dorsalis pedis and posterior tibial  pulses are palpable  bilaterally.  Capillary return is within normal limits  bilaterally. Temperature is within normal limits  bilaterally.  Neurologic  Senn-Weinstein monofilament wire test diminished   bilaterally. Muscle power within normal limits bilaterally.  Nails Thick disfigured discolored nails with subungual debris  from hallux to fifth toes bilaterally. No evidence of bacterial infection or drainage bilaterally.  Orthopedic  No limitations of motion  feet .  No crepitus or effusions noted.  No bony pathology or digital deformities noted.  Skin  normotropic skin  noted bilaterally.  No signs of infections or ulcers noted.   Porokeratosis sub 4  B/L symptomatic.  Onychomycosis  Pain in right toes  Pain in left toes  Porokeratosis sub 4  B/L.  Consent was obtained for treatment procedures.   Mechanical debridement of nails 1-5  bilaterally performed with a nail nipper.  Filed with dremel without incident.  Debride porokeratosis with # 15 blade.   Return office visit   9 weeks                  Told patient to return for periodic foot care and evaluation due to potential at risk complications.   Angellee Cohill DPM  

## 2021-10-29 DIAGNOSIS — D689 Coagulation defect, unspecified: Secondary | ICD-10-CM | POA: Diagnosis not present

## 2021-10-29 DIAGNOSIS — D631 Anemia in chronic kidney disease: Secondary | ICD-10-CM | POA: Diagnosis not present

## 2021-10-29 DIAGNOSIS — N2581 Secondary hyperparathyroidism of renal origin: Secondary | ICD-10-CM | POA: Diagnosis not present

## 2021-10-29 DIAGNOSIS — Z992 Dependence on renal dialysis: Secondary | ICD-10-CM | POA: Diagnosis not present

## 2021-10-29 DIAGNOSIS — N186 End stage renal disease: Secondary | ICD-10-CM | POA: Diagnosis not present

## 2021-10-29 DIAGNOSIS — E1129 Type 2 diabetes mellitus with other diabetic kidney complication: Secondary | ICD-10-CM | POA: Diagnosis not present

## 2021-10-31 DIAGNOSIS — D689 Coagulation defect, unspecified: Secondary | ICD-10-CM | POA: Diagnosis not present

## 2021-10-31 DIAGNOSIS — E1129 Type 2 diabetes mellitus with other diabetic kidney complication: Secondary | ICD-10-CM | POA: Diagnosis not present

## 2021-10-31 DIAGNOSIS — N186 End stage renal disease: Secondary | ICD-10-CM | POA: Diagnosis not present

## 2021-10-31 DIAGNOSIS — N2581 Secondary hyperparathyroidism of renal origin: Secondary | ICD-10-CM | POA: Diagnosis not present

## 2021-10-31 DIAGNOSIS — Z992 Dependence on renal dialysis: Secondary | ICD-10-CM | POA: Diagnosis not present

## 2021-10-31 DIAGNOSIS — D631 Anemia in chronic kidney disease: Secondary | ICD-10-CM | POA: Diagnosis not present

## 2021-11-03 DIAGNOSIS — Z992 Dependence on renal dialysis: Secondary | ICD-10-CM | POA: Diagnosis not present

## 2021-11-03 DIAGNOSIS — D631 Anemia in chronic kidney disease: Secondary | ICD-10-CM | POA: Diagnosis not present

## 2021-11-03 DIAGNOSIS — E1129 Type 2 diabetes mellitus with other diabetic kidney complication: Secondary | ICD-10-CM | POA: Diagnosis not present

## 2021-11-03 DIAGNOSIS — N2581 Secondary hyperparathyroidism of renal origin: Secondary | ICD-10-CM | POA: Diagnosis not present

## 2021-11-03 DIAGNOSIS — N186 End stage renal disease: Secondary | ICD-10-CM | POA: Diagnosis not present

## 2021-11-03 DIAGNOSIS — D689 Coagulation defect, unspecified: Secondary | ICD-10-CM | POA: Diagnosis not present

## 2021-11-05 DIAGNOSIS — D689 Coagulation defect, unspecified: Secondary | ICD-10-CM | POA: Diagnosis not present

## 2021-11-05 DIAGNOSIS — N186 End stage renal disease: Secondary | ICD-10-CM | POA: Diagnosis not present

## 2021-11-05 DIAGNOSIS — N2581 Secondary hyperparathyroidism of renal origin: Secondary | ICD-10-CM | POA: Diagnosis not present

## 2021-11-05 DIAGNOSIS — E1129 Type 2 diabetes mellitus with other diabetic kidney complication: Secondary | ICD-10-CM | POA: Diagnosis not present

## 2021-11-05 DIAGNOSIS — D631 Anemia in chronic kidney disease: Secondary | ICD-10-CM | POA: Diagnosis not present

## 2021-11-05 DIAGNOSIS — Z992 Dependence on renal dialysis: Secondary | ICD-10-CM | POA: Diagnosis not present

## 2021-11-07 DIAGNOSIS — N186 End stage renal disease: Secondary | ICD-10-CM | POA: Diagnosis not present

## 2021-11-07 DIAGNOSIS — N2581 Secondary hyperparathyroidism of renal origin: Secondary | ICD-10-CM | POA: Diagnosis not present

## 2021-11-07 DIAGNOSIS — E1129 Type 2 diabetes mellitus with other diabetic kidney complication: Secondary | ICD-10-CM | POA: Diagnosis not present

## 2021-11-07 DIAGNOSIS — D689 Coagulation defect, unspecified: Secondary | ICD-10-CM | POA: Diagnosis not present

## 2021-11-07 DIAGNOSIS — D631 Anemia in chronic kidney disease: Secondary | ICD-10-CM | POA: Diagnosis not present

## 2021-11-07 DIAGNOSIS — Z992 Dependence on renal dialysis: Secondary | ICD-10-CM | POA: Diagnosis not present

## 2021-11-08 DIAGNOSIS — Z992 Dependence on renal dialysis: Secondary | ICD-10-CM | POA: Diagnosis not present

## 2021-11-08 DIAGNOSIS — N186 End stage renal disease: Secondary | ICD-10-CM | POA: Diagnosis not present

## 2021-11-08 DIAGNOSIS — E1122 Type 2 diabetes mellitus with diabetic chronic kidney disease: Secondary | ICD-10-CM | POA: Diagnosis not present

## 2021-11-10 DIAGNOSIS — N186 End stage renal disease: Secondary | ICD-10-CM | POA: Diagnosis not present

## 2021-11-10 DIAGNOSIS — D689 Coagulation defect, unspecified: Secondary | ICD-10-CM | POA: Diagnosis not present

## 2021-11-10 DIAGNOSIS — N2581 Secondary hyperparathyroidism of renal origin: Secondary | ICD-10-CM | POA: Diagnosis not present

## 2021-11-10 DIAGNOSIS — Z992 Dependence on renal dialysis: Secondary | ICD-10-CM | POA: Diagnosis not present

## 2021-11-12 DIAGNOSIS — N186 End stage renal disease: Secondary | ICD-10-CM | POA: Diagnosis not present

## 2021-11-12 DIAGNOSIS — D689 Coagulation defect, unspecified: Secondary | ICD-10-CM | POA: Diagnosis not present

## 2021-11-12 DIAGNOSIS — Z992 Dependence on renal dialysis: Secondary | ICD-10-CM | POA: Diagnosis not present

## 2021-11-12 DIAGNOSIS — N2581 Secondary hyperparathyroidism of renal origin: Secondary | ICD-10-CM | POA: Diagnosis not present

## 2021-11-14 DIAGNOSIS — N2581 Secondary hyperparathyroidism of renal origin: Secondary | ICD-10-CM | POA: Diagnosis not present

## 2021-11-14 DIAGNOSIS — N186 End stage renal disease: Secondary | ICD-10-CM | POA: Diagnosis not present

## 2021-11-14 DIAGNOSIS — Z992 Dependence on renal dialysis: Secondary | ICD-10-CM | POA: Diagnosis not present

## 2021-11-14 DIAGNOSIS — D689 Coagulation defect, unspecified: Secondary | ICD-10-CM | POA: Diagnosis not present

## 2021-11-16 ENCOUNTER — Other Ambulatory Visit: Payer: Self-pay | Admitting: Internal Medicine

## 2021-11-16 NOTE — Telephone Encounter (Signed)
Please refill as per office routine med refill policy (all routine meds to be refilled for 3 mo or monthly (per pt preference) up to one year from last visit, then month to month grace period for 3 mo, then further med refills will have to be denied) ? ?

## 2021-11-17 DIAGNOSIS — D689 Coagulation defect, unspecified: Secondary | ICD-10-CM | POA: Diagnosis not present

## 2021-11-17 DIAGNOSIS — N2581 Secondary hyperparathyroidism of renal origin: Secondary | ICD-10-CM | POA: Diagnosis not present

## 2021-11-17 DIAGNOSIS — Z992 Dependence on renal dialysis: Secondary | ICD-10-CM | POA: Diagnosis not present

## 2021-11-17 DIAGNOSIS — N186 End stage renal disease: Secondary | ICD-10-CM | POA: Diagnosis not present

## 2021-11-19 DIAGNOSIS — D689 Coagulation defect, unspecified: Secondary | ICD-10-CM | POA: Diagnosis not present

## 2021-11-19 DIAGNOSIS — Z992 Dependence on renal dialysis: Secondary | ICD-10-CM | POA: Diagnosis not present

## 2021-11-19 DIAGNOSIS — N2581 Secondary hyperparathyroidism of renal origin: Secondary | ICD-10-CM | POA: Diagnosis not present

## 2021-11-19 DIAGNOSIS — N186 End stage renal disease: Secondary | ICD-10-CM | POA: Diagnosis not present

## 2021-11-21 DIAGNOSIS — Z992 Dependence on renal dialysis: Secondary | ICD-10-CM | POA: Diagnosis not present

## 2021-11-21 DIAGNOSIS — N186 End stage renal disease: Secondary | ICD-10-CM | POA: Diagnosis not present

## 2021-11-21 DIAGNOSIS — N2581 Secondary hyperparathyroidism of renal origin: Secondary | ICD-10-CM | POA: Diagnosis not present

## 2021-11-21 DIAGNOSIS — D689 Coagulation defect, unspecified: Secondary | ICD-10-CM | POA: Diagnosis not present

## 2021-11-24 DIAGNOSIS — Z992 Dependence on renal dialysis: Secondary | ICD-10-CM | POA: Diagnosis not present

## 2021-11-24 DIAGNOSIS — N186 End stage renal disease: Secondary | ICD-10-CM | POA: Diagnosis not present

## 2021-11-24 DIAGNOSIS — N2581 Secondary hyperparathyroidism of renal origin: Secondary | ICD-10-CM | POA: Diagnosis not present

## 2021-11-24 DIAGNOSIS — D689 Coagulation defect, unspecified: Secondary | ICD-10-CM | POA: Diagnosis not present

## 2021-11-26 DIAGNOSIS — D689 Coagulation defect, unspecified: Secondary | ICD-10-CM | POA: Diagnosis not present

## 2021-11-26 DIAGNOSIS — N186 End stage renal disease: Secondary | ICD-10-CM | POA: Diagnosis not present

## 2021-11-26 DIAGNOSIS — Z992 Dependence on renal dialysis: Secondary | ICD-10-CM | POA: Diagnosis not present

## 2021-11-26 DIAGNOSIS — N2581 Secondary hyperparathyroidism of renal origin: Secondary | ICD-10-CM | POA: Diagnosis not present

## 2021-11-28 DIAGNOSIS — N186 End stage renal disease: Secondary | ICD-10-CM | POA: Diagnosis not present

## 2021-11-28 DIAGNOSIS — D689 Coagulation defect, unspecified: Secondary | ICD-10-CM | POA: Diagnosis not present

## 2021-11-28 DIAGNOSIS — N2581 Secondary hyperparathyroidism of renal origin: Secondary | ICD-10-CM | POA: Diagnosis not present

## 2021-11-28 DIAGNOSIS — Z992 Dependence on renal dialysis: Secondary | ICD-10-CM | POA: Diagnosis not present

## 2021-12-01 DIAGNOSIS — N186 End stage renal disease: Secondary | ICD-10-CM | POA: Diagnosis not present

## 2021-12-01 DIAGNOSIS — N2581 Secondary hyperparathyroidism of renal origin: Secondary | ICD-10-CM | POA: Diagnosis not present

## 2021-12-01 DIAGNOSIS — Z992 Dependence on renal dialysis: Secondary | ICD-10-CM | POA: Diagnosis not present

## 2021-12-01 DIAGNOSIS — D689 Coagulation defect, unspecified: Secondary | ICD-10-CM | POA: Diagnosis not present

## 2021-12-03 DIAGNOSIS — N186 End stage renal disease: Secondary | ICD-10-CM | POA: Diagnosis not present

## 2021-12-03 DIAGNOSIS — N2581 Secondary hyperparathyroidism of renal origin: Secondary | ICD-10-CM | POA: Diagnosis not present

## 2021-12-03 DIAGNOSIS — Z992 Dependence on renal dialysis: Secondary | ICD-10-CM | POA: Diagnosis not present

## 2021-12-03 DIAGNOSIS — D689 Coagulation defect, unspecified: Secondary | ICD-10-CM | POA: Diagnosis not present

## 2021-12-05 DIAGNOSIS — Z992 Dependence on renal dialysis: Secondary | ICD-10-CM | POA: Diagnosis not present

## 2021-12-05 DIAGNOSIS — N186 End stage renal disease: Secondary | ICD-10-CM | POA: Diagnosis not present

## 2021-12-05 DIAGNOSIS — D689 Coagulation defect, unspecified: Secondary | ICD-10-CM | POA: Diagnosis not present

## 2021-12-05 DIAGNOSIS — N2581 Secondary hyperparathyroidism of renal origin: Secondary | ICD-10-CM | POA: Diagnosis not present

## 2021-12-08 DIAGNOSIS — D689 Coagulation defect, unspecified: Secondary | ICD-10-CM | POA: Diagnosis not present

## 2021-12-08 DIAGNOSIS — Z992 Dependence on renal dialysis: Secondary | ICD-10-CM | POA: Diagnosis not present

## 2021-12-08 DIAGNOSIS — N186 End stage renal disease: Secondary | ICD-10-CM | POA: Diagnosis not present

## 2021-12-08 DIAGNOSIS — N2581 Secondary hyperparathyroidism of renal origin: Secondary | ICD-10-CM | POA: Diagnosis not present

## 2021-12-09 DIAGNOSIS — Z992 Dependence on renal dialysis: Secondary | ICD-10-CM | POA: Diagnosis not present

## 2021-12-09 DIAGNOSIS — E1122 Type 2 diabetes mellitus with diabetic chronic kidney disease: Secondary | ICD-10-CM | POA: Diagnosis not present

## 2021-12-09 DIAGNOSIS — N186 End stage renal disease: Secondary | ICD-10-CM | POA: Diagnosis not present

## 2021-12-10 DIAGNOSIS — N186 End stage renal disease: Secondary | ICD-10-CM | POA: Diagnosis not present

## 2021-12-10 DIAGNOSIS — N2581 Secondary hyperparathyroidism of renal origin: Secondary | ICD-10-CM | POA: Diagnosis not present

## 2021-12-10 DIAGNOSIS — D689 Coagulation defect, unspecified: Secondary | ICD-10-CM | POA: Diagnosis not present

## 2021-12-10 DIAGNOSIS — Z992 Dependence on renal dialysis: Secondary | ICD-10-CM | POA: Diagnosis not present

## 2021-12-10 DIAGNOSIS — E1129 Type 2 diabetes mellitus with other diabetic kidney complication: Secondary | ICD-10-CM | POA: Diagnosis not present

## 2021-12-12 DIAGNOSIS — E1129 Type 2 diabetes mellitus with other diabetic kidney complication: Secondary | ICD-10-CM | POA: Diagnosis not present

## 2021-12-12 DIAGNOSIS — Z992 Dependence on renal dialysis: Secondary | ICD-10-CM | POA: Diagnosis not present

## 2021-12-12 DIAGNOSIS — N2581 Secondary hyperparathyroidism of renal origin: Secondary | ICD-10-CM | POA: Diagnosis not present

## 2021-12-12 DIAGNOSIS — N186 End stage renal disease: Secondary | ICD-10-CM | POA: Diagnosis not present

## 2021-12-12 DIAGNOSIS — D689 Coagulation defect, unspecified: Secondary | ICD-10-CM | POA: Diagnosis not present

## 2021-12-15 DIAGNOSIS — N186 End stage renal disease: Secondary | ICD-10-CM | POA: Diagnosis not present

## 2021-12-15 DIAGNOSIS — D689 Coagulation defect, unspecified: Secondary | ICD-10-CM | POA: Diagnosis not present

## 2021-12-15 DIAGNOSIS — Z992 Dependence on renal dialysis: Secondary | ICD-10-CM | POA: Diagnosis not present

## 2021-12-15 DIAGNOSIS — E1129 Type 2 diabetes mellitus with other diabetic kidney complication: Secondary | ICD-10-CM | POA: Diagnosis not present

## 2021-12-15 DIAGNOSIS — N2581 Secondary hyperparathyroidism of renal origin: Secondary | ICD-10-CM | POA: Diagnosis not present

## 2021-12-17 DIAGNOSIS — N2581 Secondary hyperparathyroidism of renal origin: Secondary | ICD-10-CM | POA: Diagnosis not present

## 2021-12-17 DIAGNOSIS — N186 End stage renal disease: Secondary | ICD-10-CM | POA: Diagnosis not present

## 2021-12-17 DIAGNOSIS — D689 Coagulation defect, unspecified: Secondary | ICD-10-CM | POA: Diagnosis not present

## 2021-12-17 DIAGNOSIS — E1129 Type 2 diabetes mellitus with other diabetic kidney complication: Secondary | ICD-10-CM | POA: Diagnosis not present

## 2021-12-17 DIAGNOSIS — Z992 Dependence on renal dialysis: Secondary | ICD-10-CM | POA: Diagnosis not present

## 2021-12-19 DIAGNOSIS — N2581 Secondary hyperparathyroidism of renal origin: Secondary | ICD-10-CM | POA: Diagnosis not present

## 2021-12-19 DIAGNOSIS — D689 Coagulation defect, unspecified: Secondary | ICD-10-CM | POA: Diagnosis not present

## 2021-12-19 DIAGNOSIS — Z992 Dependence on renal dialysis: Secondary | ICD-10-CM | POA: Diagnosis not present

## 2021-12-19 DIAGNOSIS — E1129 Type 2 diabetes mellitus with other diabetic kidney complication: Secondary | ICD-10-CM | POA: Diagnosis not present

## 2021-12-19 DIAGNOSIS — N186 End stage renal disease: Secondary | ICD-10-CM | POA: Diagnosis not present

## 2021-12-22 DIAGNOSIS — Z992 Dependence on renal dialysis: Secondary | ICD-10-CM | POA: Diagnosis not present

## 2021-12-22 DIAGNOSIS — D689 Coagulation defect, unspecified: Secondary | ICD-10-CM | POA: Diagnosis not present

## 2021-12-22 DIAGNOSIS — N186 End stage renal disease: Secondary | ICD-10-CM | POA: Diagnosis not present

## 2021-12-22 DIAGNOSIS — E1129 Type 2 diabetes mellitus with other diabetic kidney complication: Secondary | ICD-10-CM | POA: Diagnosis not present

## 2021-12-22 DIAGNOSIS — N2581 Secondary hyperparathyroidism of renal origin: Secondary | ICD-10-CM | POA: Diagnosis not present

## 2021-12-24 DIAGNOSIS — Z992 Dependence on renal dialysis: Secondary | ICD-10-CM | POA: Diagnosis not present

## 2021-12-24 DIAGNOSIS — E1129 Type 2 diabetes mellitus with other diabetic kidney complication: Secondary | ICD-10-CM | POA: Diagnosis not present

## 2021-12-24 DIAGNOSIS — N2581 Secondary hyperparathyroidism of renal origin: Secondary | ICD-10-CM | POA: Diagnosis not present

## 2021-12-24 DIAGNOSIS — D689 Coagulation defect, unspecified: Secondary | ICD-10-CM | POA: Diagnosis not present

## 2021-12-24 DIAGNOSIS — N186 End stage renal disease: Secondary | ICD-10-CM | POA: Diagnosis not present

## 2021-12-26 DIAGNOSIS — N2581 Secondary hyperparathyroidism of renal origin: Secondary | ICD-10-CM | POA: Diagnosis not present

## 2021-12-26 DIAGNOSIS — E1129 Type 2 diabetes mellitus with other diabetic kidney complication: Secondary | ICD-10-CM | POA: Diagnosis not present

## 2021-12-26 DIAGNOSIS — D689 Coagulation defect, unspecified: Secondary | ICD-10-CM | POA: Diagnosis not present

## 2021-12-26 DIAGNOSIS — Z992 Dependence on renal dialysis: Secondary | ICD-10-CM | POA: Diagnosis not present

## 2021-12-26 DIAGNOSIS — N186 End stage renal disease: Secondary | ICD-10-CM | POA: Diagnosis not present

## 2021-12-29 DIAGNOSIS — N186 End stage renal disease: Secondary | ICD-10-CM | POA: Diagnosis not present

## 2021-12-29 DIAGNOSIS — D689 Coagulation defect, unspecified: Secondary | ICD-10-CM | POA: Diagnosis not present

## 2021-12-29 DIAGNOSIS — Z992 Dependence on renal dialysis: Secondary | ICD-10-CM | POA: Diagnosis not present

## 2021-12-29 DIAGNOSIS — N2581 Secondary hyperparathyroidism of renal origin: Secondary | ICD-10-CM | POA: Diagnosis not present

## 2021-12-29 DIAGNOSIS — E1129 Type 2 diabetes mellitus with other diabetic kidney complication: Secondary | ICD-10-CM | POA: Diagnosis not present

## 2021-12-31 DIAGNOSIS — N186 End stage renal disease: Secondary | ICD-10-CM | POA: Diagnosis not present

## 2021-12-31 DIAGNOSIS — E1129 Type 2 diabetes mellitus with other diabetic kidney complication: Secondary | ICD-10-CM | POA: Diagnosis not present

## 2021-12-31 DIAGNOSIS — N2581 Secondary hyperparathyroidism of renal origin: Secondary | ICD-10-CM | POA: Diagnosis not present

## 2021-12-31 DIAGNOSIS — Z992 Dependence on renal dialysis: Secondary | ICD-10-CM | POA: Diagnosis not present

## 2021-12-31 DIAGNOSIS — D689 Coagulation defect, unspecified: Secondary | ICD-10-CM | POA: Diagnosis not present

## 2022-01-02 DIAGNOSIS — D689 Coagulation defect, unspecified: Secondary | ICD-10-CM | POA: Diagnosis not present

## 2022-01-02 DIAGNOSIS — N186 End stage renal disease: Secondary | ICD-10-CM | POA: Diagnosis not present

## 2022-01-02 DIAGNOSIS — Z992 Dependence on renal dialysis: Secondary | ICD-10-CM | POA: Diagnosis not present

## 2022-01-02 DIAGNOSIS — N2581 Secondary hyperparathyroidism of renal origin: Secondary | ICD-10-CM | POA: Diagnosis not present

## 2022-01-02 DIAGNOSIS — E1129 Type 2 diabetes mellitus with other diabetic kidney complication: Secondary | ICD-10-CM | POA: Diagnosis not present

## 2022-01-05 DIAGNOSIS — N186 End stage renal disease: Secondary | ICD-10-CM | POA: Diagnosis not present

## 2022-01-05 DIAGNOSIS — N2581 Secondary hyperparathyroidism of renal origin: Secondary | ICD-10-CM | POA: Diagnosis not present

## 2022-01-05 DIAGNOSIS — D689 Coagulation defect, unspecified: Secondary | ICD-10-CM | POA: Diagnosis not present

## 2022-01-05 DIAGNOSIS — E1129 Type 2 diabetes mellitus with other diabetic kidney complication: Secondary | ICD-10-CM | POA: Diagnosis not present

## 2022-01-05 DIAGNOSIS — Z992 Dependence on renal dialysis: Secondary | ICD-10-CM | POA: Diagnosis not present

## 2022-01-06 ENCOUNTER — Encounter: Payer: Self-pay | Admitting: Podiatry

## 2022-01-06 ENCOUNTER — Ambulatory Visit (INDEPENDENT_AMBULATORY_CARE_PROVIDER_SITE_OTHER): Payer: Medicare Other | Admitting: Podiatry

## 2022-01-06 DIAGNOSIS — M79674 Pain in right toe(s): Secondary | ICD-10-CM

## 2022-01-06 DIAGNOSIS — N186 End stage renal disease: Secondary | ICD-10-CM | POA: Diagnosis not present

## 2022-01-06 DIAGNOSIS — M79675 Pain in left toe(s): Secondary | ICD-10-CM

## 2022-01-06 DIAGNOSIS — E1142 Type 2 diabetes mellitus with diabetic polyneuropathy: Secondary | ICD-10-CM | POA: Diagnosis not present

## 2022-01-06 DIAGNOSIS — B351 Tinea unguium: Secondary | ICD-10-CM | POA: Diagnosis not present

## 2022-01-06 DIAGNOSIS — D689 Coagulation defect, unspecified: Secondary | ICD-10-CM

## 2022-01-06 DIAGNOSIS — Q828 Other specified congenital malformations of skin: Secondary | ICD-10-CM

## 2022-01-06 NOTE — Progress Notes (Signed)
This patient returns to my office for at risk foot care.  This patient requires this care by a professional since this patient will be at risk due to having  ESRD, CKD and diabetes.  This patient is unable to cut nails himself since the patient cannot reach his nails.These nails are painful walking and wearing shoes.  This patient presents for at risk foot care today.  General Appearance  Alert, conversant and in no acute stress.  Vascular  Dorsalis pedis and posterior tibial  pulses are palpable  bilaterally.  Capillary return is within normal limits  bilaterally. Temperature is within normal limits  bilaterally.  Neurologic  Senn-Weinstein monofilament wire test diminished   bilaterally. Muscle power within normal limits bilaterally.  Nails Thick disfigured discolored nails with subungual debris  from hallux to fifth toes bilaterally. No evidence of bacterial infection or drainage bilaterally.  Orthopedic  No limitations of motion  feet .  No crepitus or effusions noted.  No bony pathology or digital deformities noted.  Skin  normotropic skin  noted bilaterally.  No signs of infections or ulcers noted.   Porokeratosis sub 4  B/L symptomatic.  Onychomycosis  Pain in right toes  Pain in left toes  Porokeratosis sub 4  B/L.  Consent was obtained for treatment procedures.   Mechanical debridement of nails 1-5  bilaterally performed with a nail nipper.  Filed with dremel without incident.  Debride porokeratosis with # 15 blade.   Return office visit   9 weeks                  Told patient to return for periodic foot care and evaluation due to potential at risk complications.   Gardiner Barefoot DPM

## 2022-01-07 DIAGNOSIS — N186 End stage renal disease: Secondary | ICD-10-CM | POA: Diagnosis not present

## 2022-01-07 DIAGNOSIS — N2581 Secondary hyperparathyroidism of renal origin: Secondary | ICD-10-CM | POA: Diagnosis not present

## 2022-01-07 DIAGNOSIS — E1129 Type 2 diabetes mellitus with other diabetic kidney complication: Secondary | ICD-10-CM | POA: Diagnosis not present

## 2022-01-07 DIAGNOSIS — Z992 Dependence on renal dialysis: Secondary | ICD-10-CM | POA: Diagnosis not present

## 2022-01-07 DIAGNOSIS — D689 Coagulation defect, unspecified: Secondary | ICD-10-CM | POA: Diagnosis not present

## 2022-01-08 DIAGNOSIS — N186 End stage renal disease: Secondary | ICD-10-CM | POA: Diagnosis not present

## 2022-01-08 DIAGNOSIS — E1122 Type 2 diabetes mellitus with diabetic chronic kidney disease: Secondary | ICD-10-CM | POA: Diagnosis not present

## 2022-01-08 DIAGNOSIS — Z992 Dependence on renal dialysis: Secondary | ICD-10-CM | POA: Diagnosis not present

## 2022-01-09 DIAGNOSIS — N2581 Secondary hyperparathyroidism of renal origin: Secondary | ICD-10-CM | POA: Diagnosis not present

## 2022-01-09 DIAGNOSIS — D689 Coagulation defect, unspecified: Secondary | ICD-10-CM | POA: Diagnosis not present

## 2022-01-09 DIAGNOSIS — Z992 Dependence on renal dialysis: Secondary | ICD-10-CM | POA: Diagnosis not present

## 2022-01-09 DIAGNOSIS — E1129 Type 2 diabetes mellitus with other diabetic kidney complication: Secondary | ICD-10-CM | POA: Diagnosis not present

## 2022-01-09 DIAGNOSIS — N186 End stage renal disease: Secondary | ICD-10-CM | POA: Diagnosis not present

## 2022-01-12 DIAGNOSIS — D689 Coagulation defect, unspecified: Secondary | ICD-10-CM | POA: Diagnosis not present

## 2022-01-12 DIAGNOSIS — N2581 Secondary hyperparathyroidism of renal origin: Secondary | ICD-10-CM | POA: Diagnosis not present

## 2022-01-12 DIAGNOSIS — N186 End stage renal disease: Secondary | ICD-10-CM | POA: Diagnosis not present

## 2022-01-12 DIAGNOSIS — Z992 Dependence on renal dialysis: Secondary | ICD-10-CM | POA: Diagnosis not present

## 2022-01-12 DIAGNOSIS — E1129 Type 2 diabetes mellitus with other diabetic kidney complication: Secondary | ICD-10-CM | POA: Diagnosis not present

## 2022-01-13 DIAGNOSIS — H34211 Partial retinal artery occlusion, right eye: Secondary | ICD-10-CM | POA: Diagnosis not present

## 2022-01-13 DIAGNOSIS — H35033 Hypertensive retinopathy, bilateral: Secondary | ICD-10-CM | POA: Diagnosis not present

## 2022-01-13 DIAGNOSIS — H2513 Age-related nuclear cataract, bilateral: Secondary | ICD-10-CM | POA: Diagnosis not present

## 2022-01-13 DIAGNOSIS — H5703 Miosis: Secondary | ICD-10-CM | POA: Diagnosis not present

## 2022-01-13 DIAGNOSIS — E119 Type 2 diabetes mellitus without complications: Secondary | ICD-10-CM | POA: Diagnosis not present

## 2022-01-13 LAB — HM DIABETES EYE EXAM

## 2022-01-14 DIAGNOSIS — E1129 Type 2 diabetes mellitus with other diabetic kidney complication: Secondary | ICD-10-CM | POA: Diagnosis not present

## 2022-01-14 DIAGNOSIS — Z992 Dependence on renal dialysis: Secondary | ICD-10-CM | POA: Diagnosis not present

## 2022-01-14 DIAGNOSIS — N2581 Secondary hyperparathyroidism of renal origin: Secondary | ICD-10-CM | POA: Diagnosis not present

## 2022-01-14 DIAGNOSIS — D689 Coagulation defect, unspecified: Secondary | ICD-10-CM | POA: Diagnosis not present

## 2022-01-14 DIAGNOSIS — N186 End stage renal disease: Secondary | ICD-10-CM | POA: Diagnosis not present

## 2022-01-16 DIAGNOSIS — Z992 Dependence on renal dialysis: Secondary | ICD-10-CM | POA: Diagnosis not present

## 2022-01-16 DIAGNOSIS — D689 Coagulation defect, unspecified: Secondary | ICD-10-CM | POA: Diagnosis not present

## 2022-01-16 DIAGNOSIS — E1129 Type 2 diabetes mellitus with other diabetic kidney complication: Secondary | ICD-10-CM | POA: Diagnosis not present

## 2022-01-16 DIAGNOSIS — N2581 Secondary hyperparathyroidism of renal origin: Secondary | ICD-10-CM | POA: Diagnosis not present

## 2022-01-16 DIAGNOSIS — N186 End stage renal disease: Secondary | ICD-10-CM | POA: Diagnosis not present

## 2022-01-19 DIAGNOSIS — N186 End stage renal disease: Secondary | ICD-10-CM | POA: Diagnosis not present

## 2022-01-19 DIAGNOSIS — Z992 Dependence on renal dialysis: Secondary | ICD-10-CM | POA: Diagnosis not present

## 2022-01-19 DIAGNOSIS — E1129 Type 2 diabetes mellitus with other diabetic kidney complication: Secondary | ICD-10-CM | POA: Diagnosis not present

## 2022-01-19 DIAGNOSIS — N2581 Secondary hyperparathyroidism of renal origin: Secondary | ICD-10-CM | POA: Diagnosis not present

## 2022-01-19 DIAGNOSIS — D689 Coagulation defect, unspecified: Secondary | ICD-10-CM | POA: Diagnosis not present

## 2022-01-21 DIAGNOSIS — N2581 Secondary hyperparathyroidism of renal origin: Secondary | ICD-10-CM | POA: Diagnosis not present

## 2022-01-21 DIAGNOSIS — E1129 Type 2 diabetes mellitus with other diabetic kidney complication: Secondary | ICD-10-CM | POA: Diagnosis not present

## 2022-01-21 DIAGNOSIS — Z992 Dependence on renal dialysis: Secondary | ICD-10-CM | POA: Diagnosis not present

## 2022-01-21 DIAGNOSIS — N186 End stage renal disease: Secondary | ICD-10-CM | POA: Diagnosis not present

## 2022-01-21 DIAGNOSIS — D689 Coagulation defect, unspecified: Secondary | ICD-10-CM | POA: Diagnosis not present

## 2022-01-23 DIAGNOSIS — E1129 Type 2 diabetes mellitus with other diabetic kidney complication: Secondary | ICD-10-CM | POA: Diagnosis not present

## 2022-01-23 DIAGNOSIS — Z992 Dependence on renal dialysis: Secondary | ICD-10-CM | POA: Diagnosis not present

## 2022-01-23 DIAGNOSIS — N2581 Secondary hyperparathyroidism of renal origin: Secondary | ICD-10-CM | POA: Diagnosis not present

## 2022-01-23 DIAGNOSIS — N186 End stage renal disease: Secondary | ICD-10-CM | POA: Diagnosis not present

## 2022-01-23 DIAGNOSIS — D689 Coagulation defect, unspecified: Secondary | ICD-10-CM | POA: Diagnosis not present

## 2022-01-26 DIAGNOSIS — D689 Coagulation defect, unspecified: Secondary | ICD-10-CM | POA: Diagnosis not present

## 2022-01-26 DIAGNOSIS — N186 End stage renal disease: Secondary | ICD-10-CM | POA: Diagnosis not present

## 2022-01-26 DIAGNOSIS — Z992 Dependence on renal dialysis: Secondary | ICD-10-CM | POA: Diagnosis not present

## 2022-01-26 DIAGNOSIS — E1129 Type 2 diabetes mellitus with other diabetic kidney complication: Secondary | ICD-10-CM | POA: Diagnosis not present

## 2022-01-26 DIAGNOSIS — N2581 Secondary hyperparathyroidism of renal origin: Secondary | ICD-10-CM | POA: Diagnosis not present

## 2022-01-28 DIAGNOSIS — E1129 Type 2 diabetes mellitus with other diabetic kidney complication: Secondary | ICD-10-CM | POA: Diagnosis not present

## 2022-01-28 DIAGNOSIS — N2581 Secondary hyperparathyroidism of renal origin: Secondary | ICD-10-CM | POA: Diagnosis not present

## 2022-01-28 DIAGNOSIS — N186 End stage renal disease: Secondary | ICD-10-CM | POA: Diagnosis not present

## 2022-01-28 DIAGNOSIS — Z992 Dependence on renal dialysis: Secondary | ICD-10-CM | POA: Diagnosis not present

## 2022-01-28 DIAGNOSIS — D689 Coagulation defect, unspecified: Secondary | ICD-10-CM | POA: Diagnosis not present

## 2022-01-30 DIAGNOSIS — D689 Coagulation defect, unspecified: Secondary | ICD-10-CM | POA: Diagnosis not present

## 2022-01-30 DIAGNOSIS — N2581 Secondary hyperparathyroidism of renal origin: Secondary | ICD-10-CM | POA: Diagnosis not present

## 2022-01-30 DIAGNOSIS — N186 End stage renal disease: Secondary | ICD-10-CM | POA: Diagnosis not present

## 2022-01-30 DIAGNOSIS — E1129 Type 2 diabetes mellitus with other diabetic kidney complication: Secondary | ICD-10-CM | POA: Diagnosis not present

## 2022-01-30 DIAGNOSIS — Z992 Dependence on renal dialysis: Secondary | ICD-10-CM | POA: Diagnosis not present

## 2022-02-02 DIAGNOSIS — N186 End stage renal disease: Secondary | ICD-10-CM | POA: Diagnosis not present

## 2022-02-02 DIAGNOSIS — D689 Coagulation defect, unspecified: Secondary | ICD-10-CM | POA: Diagnosis not present

## 2022-02-02 DIAGNOSIS — Z992 Dependence on renal dialysis: Secondary | ICD-10-CM | POA: Diagnosis not present

## 2022-02-02 DIAGNOSIS — N2581 Secondary hyperparathyroidism of renal origin: Secondary | ICD-10-CM | POA: Diagnosis not present

## 2022-02-02 DIAGNOSIS — E1129 Type 2 diabetes mellitus with other diabetic kidney complication: Secondary | ICD-10-CM | POA: Diagnosis not present

## 2022-02-04 DIAGNOSIS — D689 Coagulation defect, unspecified: Secondary | ICD-10-CM | POA: Diagnosis not present

## 2022-02-04 DIAGNOSIS — N186 End stage renal disease: Secondary | ICD-10-CM | POA: Diagnosis not present

## 2022-02-04 DIAGNOSIS — E1129 Type 2 diabetes mellitus with other diabetic kidney complication: Secondary | ICD-10-CM | POA: Diagnosis not present

## 2022-02-04 DIAGNOSIS — N2581 Secondary hyperparathyroidism of renal origin: Secondary | ICD-10-CM | POA: Diagnosis not present

## 2022-02-04 DIAGNOSIS — Z992 Dependence on renal dialysis: Secondary | ICD-10-CM | POA: Diagnosis not present

## 2022-02-05 DIAGNOSIS — T82858A Stenosis of vascular prosthetic devices, implants and grafts, initial encounter: Secondary | ICD-10-CM | POA: Diagnosis not present

## 2022-02-05 DIAGNOSIS — Z992 Dependence on renal dialysis: Secondary | ICD-10-CM | POA: Diagnosis not present

## 2022-02-05 DIAGNOSIS — N186 End stage renal disease: Secondary | ICD-10-CM | POA: Diagnosis not present

## 2022-02-05 DIAGNOSIS — I871 Compression of vein: Secondary | ICD-10-CM | POA: Diagnosis not present

## 2022-02-06 DIAGNOSIS — E1129 Type 2 diabetes mellitus with other diabetic kidney complication: Secondary | ICD-10-CM | POA: Diagnosis not present

## 2022-02-06 DIAGNOSIS — D689 Coagulation defect, unspecified: Secondary | ICD-10-CM | POA: Diagnosis not present

## 2022-02-06 DIAGNOSIS — N2581 Secondary hyperparathyroidism of renal origin: Secondary | ICD-10-CM | POA: Diagnosis not present

## 2022-02-06 DIAGNOSIS — Z992 Dependence on renal dialysis: Secondary | ICD-10-CM | POA: Diagnosis not present

## 2022-02-06 DIAGNOSIS — N186 End stage renal disease: Secondary | ICD-10-CM | POA: Diagnosis not present

## 2022-02-08 DIAGNOSIS — E1122 Type 2 diabetes mellitus with diabetic chronic kidney disease: Secondary | ICD-10-CM | POA: Diagnosis not present

## 2022-02-08 DIAGNOSIS — N186 End stage renal disease: Secondary | ICD-10-CM | POA: Diagnosis not present

## 2022-02-08 DIAGNOSIS — Z992 Dependence on renal dialysis: Secondary | ICD-10-CM | POA: Diagnosis not present

## 2022-02-09 DIAGNOSIS — D631 Anemia in chronic kidney disease: Secondary | ICD-10-CM | POA: Diagnosis not present

## 2022-02-09 DIAGNOSIS — N2581 Secondary hyperparathyroidism of renal origin: Secondary | ICD-10-CM | POA: Diagnosis not present

## 2022-02-09 DIAGNOSIS — E1129 Type 2 diabetes mellitus with other diabetic kidney complication: Secondary | ICD-10-CM | POA: Diagnosis not present

## 2022-02-09 DIAGNOSIS — N186 End stage renal disease: Secondary | ICD-10-CM | POA: Diagnosis not present

## 2022-02-09 DIAGNOSIS — Z992 Dependence on renal dialysis: Secondary | ICD-10-CM | POA: Diagnosis not present

## 2022-02-09 DIAGNOSIS — D689 Coagulation defect, unspecified: Secondary | ICD-10-CM | POA: Diagnosis not present

## 2022-02-11 DIAGNOSIS — E1129 Type 2 diabetes mellitus with other diabetic kidney complication: Secondary | ICD-10-CM | POA: Diagnosis not present

## 2022-02-11 DIAGNOSIS — D631 Anemia in chronic kidney disease: Secondary | ICD-10-CM | POA: Diagnosis not present

## 2022-02-11 DIAGNOSIS — Z992 Dependence on renal dialysis: Secondary | ICD-10-CM | POA: Diagnosis not present

## 2022-02-11 DIAGNOSIS — N186 End stage renal disease: Secondary | ICD-10-CM | POA: Diagnosis not present

## 2022-02-11 DIAGNOSIS — D689 Coagulation defect, unspecified: Secondary | ICD-10-CM | POA: Diagnosis not present

## 2022-02-11 DIAGNOSIS — N2581 Secondary hyperparathyroidism of renal origin: Secondary | ICD-10-CM | POA: Diagnosis not present

## 2022-02-13 DIAGNOSIS — D631 Anemia in chronic kidney disease: Secondary | ICD-10-CM | POA: Diagnosis not present

## 2022-02-13 DIAGNOSIS — Z992 Dependence on renal dialysis: Secondary | ICD-10-CM | POA: Diagnosis not present

## 2022-02-13 DIAGNOSIS — N186 End stage renal disease: Secondary | ICD-10-CM | POA: Diagnosis not present

## 2022-02-13 DIAGNOSIS — D689 Coagulation defect, unspecified: Secondary | ICD-10-CM | POA: Diagnosis not present

## 2022-02-13 DIAGNOSIS — E1129 Type 2 diabetes mellitus with other diabetic kidney complication: Secondary | ICD-10-CM | POA: Diagnosis not present

## 2022-02-13 DIAGNOSIS — N2581 Secondary hyperparathyroidism of renal origin: Secondary | ICD-10-CM | POA: Diagnosis not present

## 2022-02-16 DIAGNOSIS — E1129 Type 2 diabetes mellitus with other diabetic kidney complication: Secondary | ICD-10-CM | POA: Diagnosis not present

## 2022-02-16 DIAGNOSIS — D631 Anemia in chronic kidney disease: Secondary | ICD-10-CM | POA: Diagnosis not present

## 2022-02-16 DIAGNOSIS — Z992 Dependence on renal dialysis: Secondary | ICD-10-CM | POA: Diagnosis not present

## 2022-02-16 DIAGNOSIS — N2581 Secondary hyperparathyroidism of renal origin: Secondary | ICD-10-CM | POA: Diagnosis not present

## 2022-02-16 DIAGNOSIS — N186 End stage renal disease: Secondary | ICD-10-CM | POA: Diagnosis not present

## 2022-02-16 DIAGNOSIS — D689 Coagulation defect, unspecified: Secondary | ICD-10-CM | POA: Diagnosis not present

## 2022-02-18 DIAGNOSIS — D689 Coagulation defect, unspecified: Secondary | ICD-10-CM | POA: Diagnosis not present

## 2022-02-18 DIAGNOSIS — N186 End stage renal disease: Secondary | ICD-10-CM | POA: Diagnosis not present

## 2022-02-18 DIAGNOSIS — N2581 Secondary hyperparathyroidism of renal origin: Secondary | ICD-10-CM | POA: Diagnosis not present

## 2022-02-18 DIAGNOSIS — E1129 Type 2 diabetes mellitus with other diabetic kidney complication: Secondary | ICD-10-CM | POA: Diagnosis not present

## 2022-02-18 DIAGNOSIS — D631 Anemia in chronic kidney disease: Secondary | ICD-10-CM | POA: Diagnosis not present

## 2022-02-18 DIAGNOSIS — Z992 Dependence on renal dialysis: Secondary | ICD-10-CM | POA: Diagnosis not present

## 2022-02-20 DIAGNOSIS — E1129 Type 2 diabetes mellitus with other diabetic kidney complication: Secondary | ICD-10-CM | POA: Diagnosis not present

## 2022-02-20 DIAGNOSIS — D631 Anemia in chronic kidney disease: Secondary | ICD-10-CM | POA: Diagnosis not present

## 2022-02-20 DIAGNOSIS — N2581 Secondary hyperparathyroidism of renal origin: Secondary | ICD-10-CM | POA: Diagnosis not present

## 2022-02-20 DIAGNOSIS — D689 Coagulation defect, unspecified: Secondary | ICD-10-CM | POA: Diagnosis not present

## 2022-02-20 DIAGNOSIS — Z992 Dependence on renal dialysis: Secondary | ICD-10-CM | POA: Diagnosis not present

## 2022-02-20 DIAGNOSIS — N186 End stage renal disease: Secondary | ICD-10-CM | POA: Diagnosis not present

## 2022-02-23 ENCOUNTER — Other Ambulatory Visit: Payer: Self-pay

## 2022-02-23 ENCOUNTER — Other Ambulatory Visit: Payer: Self-pay | Admitting: Internal Medicine

## 2022-02-23 DIAGNOSIS — N186 End stage renal disease: Secondary | ICD-10-CM | POA: Diagnosis not present

## 2022-02-23 DIAGNOSIS — Z992 Dependence on renal dialysis: Secondary | ICD-10-CM | POA: Diagnosis not present

## 2022-02-23 DIAGNOSIS — D689 Coagulation defect, unspecified: Secondary | ICD-10-CM | POA: Diagnosis not present

## 2022-02-23 DIAGNOSIS — N2581 Secondary hyperparathyroidism of renal origin: Secondary | ICD-10-CM | POA: Diagnosis not present

## 2022-02-23 DIAGNOSIS — E1129 Type 2 diabetes mellitus with other diabetic kidney complication: Secondary | ICD-10-CM | POA: Diagnosis not present

## 2022-02-23 DIAGNOSIS — D631 Anemia in chronic kidney disease: Secondary | ICD-10-CM | POA: Diagnosis not present

## 2022-02-23 MED ORDER — TRULICITY 1.5 MG/0.5ML ~~LOC~~ SOAJ: SUBCUTANEOUS | 0 refills | Status: DC

## 2022-02-23 MED ORDER — ROSUVASTATIN CALCIUM 20 MG PO TABS: 20.0000 mg | ORAL_TABLET | Freq: Every day | ORAL | 3 refills | Status: DC

## 2022-02-23 MED ORDER — PIOGLITAZONE HCL 15 MG PO TABS: 15.0000 mg | ORAL_TABLET | Freq: Every day | ORAL | 0 refills | Status: DC

## 2022-02-23 MED ORDER — METOPROLOL SUCCINATE ER 50 MG PO TB24: 50.0000 mg | ORAL_TABLET | Freq: Every day | ORAL | 3 refills | Status: DC

## 2022-02-23 NOTE — Telephone Encounter (Signed)
Please refill as per office routine med refill policy (all routine meds to be refilled for 3 mo or monthly (per pt preference) up to one year from last visit, then month to month grace period for 3 mo, then further med refills will have to be denied) ? ?

## 2022-02-24 ENCOUNTER — Telehealth: Payer: Self-pay | Admitting: Internal Medicine

## 2022-02-24 ENCOUNTER — Encounter: Payer: Medicare Other | Admitting: Vascular Surgery

## 2022-02-24 NOTE — Telephone Encounter (Signed)
Please advise and I will call the pharmacy back and let them know.

## 2022-02-24 NOTE — Telephone Encounter (Signed)
Recommendation is noted.  No need to change dosing   thanks

## 2022-02-24 NOTE — Telephone Encounter (Signed)
Christopher Lopez with [facility] calls today in regards to a dosage change for PT. PT is currently on hemodialysis and current recommendation for rosuvastatin (CRESTOR) is 10 MG. PT is currently taking this as 20 MG.   The pharmacy tech states that we can either switch PT to Rosuvastatin (CRESTOR) 10 MG or if a higher dosage statin is needed she suggested atorvastatin (LIPITOR) in the 40 MG.   CB: [phone]

## 2022-02-25 DIAGNOSIS — N2581 Secondary hyperparathyroidism of renal origin: Secondary | ICD-10-CM | POA: Diagnosis not present

## 2022-02-25 DIAGNOSIS — Z992 Dependence on renal dialysis: Secondary | ICD-10-CM | POA: Diagnosis not present

## 2022-02-25 DIAGNOSIS — N186 End stage renal disease: Secondary | ICD-10-CM | POA: Diagnosis not present

## 2022-02-25 DIAGNOSIS — D689 Coagulation defect, unspecified: Secondary | ICD-10-CM | POA: Diagnosis not present

## 2022-02-25 DIAGNOSIS — D631 Anemia in chronic kidney disease: Secondary | ICD-10-CM | POA: Diagnosis not present

## 2022-02-25 DIAGNOSIS — E1129 Type 2 diabetes mellitus with other diabetic kidney complication: Secondary | ICD-10-CM | POA: Diagnosis not present

## 2022-02-25 NOTE — Telephone Encounter (Signed)
[facility] updated

## 2022-02-27 DIAGNOSIS — D631 Anemia in chronic kidney disease: Secondary | ICD-10-CM | POA: Diagnosis not present

## 2022-02-27 DIAGNOSIS — D689 Coagulation defect, unspecified: Secondary | ICD-10-CM | POA: Diagnosis not present

## 2022-02-27 DIAGNOSIS — N2581 Secondary hyperparathyroidism of renal origin: Secondary | ICD-10-CM | POA: Diagnosis not present

## 2022-02-27 DIAGNOSIS — N186 End stage renal disease: Secondary | ICD-10-CM | POA: Diagnosis not present

## 2022-02-27 DIAGNOSIS — Z992 Dependence on renal dialysis: Secondary | ICD-10-CM | POA: Diagnosis not present

## 2022-02-27 DIAGNOSIS — E1129 Type 2 diabetes mellitus with other diabetic kidney complication: Secondary | ICD-10-CM | POA: Diagnosis not present

## 2022-03-02 DIAGNOSIS — N186 End stage renal disease: Secondary | ICD-10-CM | POA: Diagnosis not present

## 2022-03-02 DIAGNOSIS — Z992 Dependence on renal dialysis: Secondary | ICD-10-CM | POA: Diagnosis not present

## 2022-03-02 DIAGNOSIS — E1129 Type 2 diabetes mellitus with other diabetic kidney complication: Secondary | ICD-10-CM | POA: Diagnosis not present

## 2022-03-02 DIAGNOSIS — D631 Anemia in chronic kidney disease: Secondary | ICD-10-CM | POA: Diagnosis not present

## 2022-03-02 DIAGNOSIS — D689 Coagulation defect, unspecified: Secondary | ICD-10-CM | POA: Diagnosis not present

## 2022-03-02 DIAGNOSIS — N2581 Secondary hyperparathyroidism of renal origin: Secondary | ICD-10-CM | POA: Diagnosis not present

## 2022-03-04 DIAGNOSIS — D689 Coagulation defect, unspecified: Secondary | ICD-10-CM | POA: Diagnosis not present

## 2022-03-04 DIAGNOSIS — E1129 Type 2 diabetes mellitus with other diabetic kidney complication: Secondary | ICD-10-CM | POA: Diagnosis not present

## 2022-03-04 DIAGNOSIS — N186 End stage renal disease: Secondary | ICD-10-CM | POA: Diagnosis not present

## 2022-03-04 DIAGNOSIS — Z992 Dependence on renal dialysis: Secondary | ICD-10-CM | POA: Diagnosis not present

## 2022-03-04 DIAGNOSIS — D631 Anemia in chronic kidney disease: Secondary | ICD-10-CM | POA: Diagnosis not present

## 2022-03-04 DIAGNOSIS — N2581 Secondary hyperparathyroidism of renal origin: Secondary | ICD-10-CM | POA: Diagnosis not present

## 2022-03-06 DIAGNOSIS — E1129 Type 2 diabetes mellitus with other diabetic kidney complication: Secondary | ICD-10-CM | POA: Diagnosis not present

## 2022-03-06 DIAGNOSIS — D689 Coagulation defect, unspecified: Secondary | ICD-10-CM | POA: Diagnosis not present

## 2022-03-06 DIAGNOSIS — Z992 Dependence on renal dialysis: Secondary | ICD-10-CM | POA: Diagnosis not present

## 2022-03-06 DIAGNOSIS — N2581 Secondary hyperparathyroidism of renal origin: Secondary | ICD-10-CM | POA: Diagnosis not present

## 2022-03-06 DIAGNOSIS — N186 End stage renal disease: Secondary | ICD-10-CM | POA: Diagnosis not present

## 2022-03-06 DIAGNOSIS — D631 Anemia in chronic kidney disease: Secondary | ICD-10-CM | POA: Diagnosis not present

## 2022-03-09 DIAGNOSIS — D631 Anemia in chronic kidney disease: Secondary | ICD-10-CM | POA: Diagnosis not present

## 2022-03-09 DIAGNOSIS — N186 End stage renal disease: Secondary | ICD-10-CM | POA: Diagnosis not present

## 2022-03-09 DIAGNOSIS — Z992 Dependence on renal dialysis: Secondary | ICD-10-CM | POA: Diagnosis not present

## 2022-03-09 DIAGNOSIS — N2581 Secondary hyperparathyroidism of renal origin: Secondary | ICD-10-CM | POA: Diagnosis not present

## 2022-03-09 DIAGNOSIS — D689 Coagulation defect, unspecified: Secondary | ICD-10-CM | POA: Diagnosis not present

## 2022-03-09 DIAGNOSIS — E1129 Type 2 diabetes mellitus with other diabetic kidney complication: Secondary | ICD-10-CM | POA: Diagnosis not present

## 2022-03-11 DIAGNOSIS — N2581 Secondary hyperparathyroidism of renal origin: Secondary | ICD-10-CM | POA: Diagnosis not present

## 2022-03-11 DIAGNOSIS — Z992 Dependence on renal dialysis: Secondary | ICD-10-CM | POA: Diagnosis not present

## 2022-03-11 DIAGNOSIS — D631 Anemia in chronic kidney disease: Secondary | ICD-10-CM | POA: Diagnosis not present

## 2022-03-11 DIAGNOSIS — E1129 Type 2 diabetes mellitus with other diabetic kidney complication: Secondary | ICD-10-CM | POA: Diagnosis not present

## 2022-03-11 DIAGNOSIS — E1122 Type 2 diabetes mellitus with diabetic chronic kidney disease: Secondary | ICD-10-CM | POA: Diagnosis not present

## 2022-03-11 DIAGNOSIS — D689 Coagulation defect, unspecified: Secondary | ICD-10-CM | POA: Diagnosis not present

## 2022-03-11 DIAGNOSIS — N186 End stage renal disease: Secondary | ICD-10-CM | POA: Diagnosis not present

## 2022-03-13 DIAGNOSIS — Z992 Dependence on renal dialysis: Secondary | ICD-10-CM | POA: Diagnosis not present

## 2022-03-13 DIAGNOSIS — N186 End stage renal disease: Secondary | ICD-10-CM | POA: Diagnosis not present

## 2022-03-13 DIAGNOSIS — D689 Coagulation defect, unspecified: Secondary | ICD-10-CM | POA: Diagnosis not present

## 2022-03-13 DIAGNOSIS — D631 Anemia in chronic kidney disease: Secondary | ICD-10-CM | POA: Diagnosis not present

## 2022-03-13 DIAGNOSIS — E1129 Type 2 diabetes mellitus with other diabetic kidney complication: Secondary | ICD-10-CM | POA: Diagnosis not present

## 2022-03-13 DIAGNOSIS — N2581 Secondary hyperparathyroidism of renal origin: Secondary | ICD-10-CM | POA: Diagnosis not present

## 2022-03-16 DIAGNOSIS — E1129 Type 2 diabetes mellitus with other diabetic kidney complication: Secondary | ICD-10-CM | POA: Diagnosis not present

## 2022-03-16 DIAGNOSIS — Z992 Dependence on renal dialysis: Secondary | ICD-10-CM | POA: Diagnosis not present

## 2022-03-16 DIAGNOSIS — D631 Anemia in chronic kidney disease: Secondary | ICD-10-CM | POA: Diagnosis not present

## 2022-03-16 DIAGNOSIS — D689 Coagulation defect, unspecified: Secondary | ICD-10-CM | POA: Diagnosis not present

## 2022-03-16 DIAGNOSIS — N2581 Secondary hyperparathyroidism of renal origin: Secondary | ICD-10-CM | POA: Diagnosis not present

## 2022-03-16 DIAGNOSIS — N186 End stage renal disease: Secondary | ICD-10-CM | POA: Diagnosis not present

## 2022-03-18 DIAGNOSIS — N2581 Secondary hyperparathyroidism of renal origin: Secondary | ICD-10-CM | POA: Diagnosis not present

## 2022-03-18 DIAGNOSIS — E1129 Type 2 diabetes mellitus with other diabetic kidney complication: Secondary | ICD-10-CM | POA: Diagnosis not present

## 2022-03-18 DIAGNOSIS — Z992 Dependence on renal dialysis: Secondary | ICD-10-CM | POA: Diagnosis not present

## 2022-03-18 DIAGNOSIS — D689 Coagulation defect, unspecified: Secondary | ICD-10-CM | POA: Diagnosis not present

## 2022-03-18 DIAGNOSIS — N186 End stage renal disease: Secondary | ICD-10-CM | POA: Diagnosis not present

## 2022-03-18 DIAGNOSIS — D631 Anemia in chronic kidney disease: Secondary | ICD-10-CM | POA: Diagnosis not present

## 2022-03-20 DIAGNOSIS — N2581 Secondary hyperparathyroidism of renal origin: Secondary | ICD-10-CM | POA: Diagnosis not present

## 2022-03-20 DIAGNOSIS — E1129 Type 2 diabetes mellitus with other diabetic kidney complication: Secondary | ICD-10-CM | POA: Diagnosis not present

## 2022-03-20 DIAGNOSIS — N186 End stage renal disease: Secondary | ICD-10-CM | POA: Diagnosis not present

## 2022-03-20 DIAGNOSIS — D631 Anemia in chronic kidney disease: Secondary | ICD-10-CM | POA: Diagnosis not present

## 2022-03-20 DIAGNOSIS — Z992 Dependence on renal dialysis: Secondary | ICD-10-CM | POA: Diagnosis not present

## 2022-03-20 DIAGNOSIS — D689 Coagulation defect, unspecified: Secondary | ICD-10-CM | POA: Diagnosis not present

## 2022-03-23 DIAGNOSIS — Z992 Dependence on renal dialysis: Secondary | ICD-10-CM | POA: Diagnosis not present

## 2022-03-23 DIAGNOSIS — D689 Coagulation defect, unspecified: Secondary | ICD-10-CM | POA: Diagnosis not present

## 2022-03-23 DIAGNOSIS — N2581 Secondary hyperparathyroidism of renal origin: Secondary | ICD-10-CM | POA: Diagnosis not present

## 2022-03-23 DIAGNOSIS — E1129 Type 2 diabetes mellitus with other diabetic kidney complication: Secondary | ICD-10-CM | POA: Diagnosis not present

## 2022-03-23 DIAGNOSIS — N186 End stage renal disease: Secondary | ICD-10-CM | POA: Diagnosis not present

## 2022-03-23 DIAGNOSIS — D631 Anemia in chronic kidney disease: Secondary | ICD-10-CM | POA: Diagnosis not present

## 2022-03-25 DIAGNOSIS — D689 Coagulation defect, unspecified: Secondary | ICD-10-CM | POA: Diagnosis not present

## 2022-03-25 DIAGNOSIS — Z992 Dependence on renal dialysis: Secondary | ICD-10-CM | POA: Diagnosis not present

## 2022-03-25 DIAGNOSIS — N186 End stage renal disease: Secondary | ICD-10-CM | POA: Diagnosis not present

## 2022-03-25 DIAGNOSIS — N2581 Secondary hyperparathyroidism of renal origin: Secondary | ICD-10-CM | POA: Diagnosis not present

## 2022-03-25 DIAGNOSIS — E1129 Type 2 diabetes mellitus with other diabetic kidney complication: Secondary | ICD-10-CM | POA: Diagnosis not present

## 2022-03-25 DIAGNOSIS — D631 Anemia in chronic kidney disease: Secondary | ICD-10-CM | POA: Diagnosis not present

## 2022-03-27 DIAGNOSIS — N186 End stage renal disease: Secondary | ICD-10-CM | POA: Diagnosis not present

## 2022-03-27 DIAGNOSIS — N2581 Secondary hyperparathyroidism of renal origin: Secondary | ICD-10-CM | POA: Diagnosis not present

## 2022-03-27 DIAGNOSIS — D631 Anemia in chronic kidney disease: Secondary | ICD-10-CM | POA: Diagnosis not present

## 2022-03-27 DIAGNOSIS — E1129 Type 2 diabetes mellitus with other diabetic kidney complication: Secondary | ICD-10-CM | POA: Diagnosis not present

## 2022-03-27 DIAGNOSIS — D689 Coagulation defect, unspecified: Secondary | ICD-10-CM | POA: Diagnosis not present

## 2022-03-27 DIAGNOSIS — Z992 Dependence on renal dialysis: Secondary | ICD-10-CM | POA: Diagnosis not present

## 2022-03-30 DIAGNOSIS — D689 Coagulation defect, unspecified: Secondary | ICD-10-CM | POA: Diagnosis not present

## 2022-03-30 DIAGNOSIS — Z992 Dependence on renal dialysis: Secondary | ICD-10-CM | POA: Diagnosis not present

## 2022-03-30 DIAGNOSIS — N186 End stage renal disease: Secondary | ICD-10-CM | POA: Diagnosis not present

## 2022-03-30 DIAGNOSIS — N2581 Secondary hyperparathyroidism of renal origin: Secondary | ICD-10-CM | POA: Diagnosis not present

## 2022-03-30 DIAGNOSIS — E1129 Type 2 diabetes mellitus with other diabetic kidney complication: Secondary | ICD-10-CM | POA: Diagnosis not present

## 2022-03-30 DIAGNOSIS — D631 Anemia in chronic kidney disease: Secondary | ICD-10-CM | POA: Diagnosis not present

## 2022-03-31 ENCOUNTER — Ambulatory Visit (INDEPENDENT_AMBULATORY_CARE_PROVIDER_SITE_OTHER): Payer: Medicare Other | Admitting: Podiatry

## 2022-03-31 ENCOUNTER — Encounter: Payer: Self-pay | Admitting: Podiatry

## 2022-03-31 DIAGNOSIS — M79676 Pain in unspecified toe(s): Secondary | ICD-10-CM

## 2022-03-31 DIAGNOSIS — Q828 Other specified congenital malformations of skin: Secondary | ICD-10-CM

## 2022-03-31 DIAGNOSIS — D689 Coagulation defect, unspecified: Secondary | ICD-10-CM | POA: Diagnosis not present

## 2022-03-31 DIAGNOSIS — B351 Tinea unguium: Secondary | ICD-10-CM | POA: Diagnosis not present

## 2022-03-31 DIAGNOSIS — N186 End stage renal disease: Secondary | ICD-10-CM

## 2022-03-31 DIAGNOSIS — E1142 Type 2 diabetes mellitus with diabetic polyneuropathy: Secondary | ICD-10-CM | POA: Diagnosis not present

## 2022-03-31 NOTE — Progress Notes (Signed)
This patient returns to my office for at risk foot care.  This patient requires this care by a professional since this patient will be at risk due to having  ESRD, CKD and diabetes.  This patient is unable to cut nails himself since the patient cannot reach his nails.These nails are painful walking and wearing shoes.  This patient presents for at risk foot care today.  General Appearance  Alert, conversant and in no acute stress.  Vascular  Dorsalis pedis and posterior tibial  pulses are palpable  bilaterally.  Capillary return is within normal limits  bilaterally. Temperature is within normal limits  bilaterally.  Neurologic  Senn-Weinstein monofilament wire test diminished   bilaterally. Muscle power within normal limits bilaterally.  Nails Thick disfigured discolored nails with subungual debris  from hallux to fifth toes bilaterally. No evidence of bacterial infection or drainage bilaterally.  Orthopedic  No limitations of motion  feet .  No crepitus or effusions noted.  No bony pathology or digital deformities noted.  Skin  normotropic skin  noted bilaterally.  No signs of infections or ulcers noted.   Porokeratosis sub 4  B/L symptomatic.  Onychomycosis  Pain in right toes  Pain in left toes  Porokeratosis sub 4  B/L.  Consent was obtained for treatment procedures.   Mechanical debridement of nails 1-5  bilaterally performed with a nail nipper.  Filed with dremel without incident.  Debride porokeratosis with # 15 blade.   Return office visit   9 weeks                  Told patient to return for periodic foot care and evaluation due to potential at risk complications.   Gardiner Barefoot DPM

## 2022-04-01 DIAGNOSIS — N2581 Secondary hyperparathyroidism of renal origin: Secondary | ICD-10-CM | POA: Diagnosis not present

## 2022-04-01 DIAGNOSIS — N186 End stage renal disease: Secondary | ICD-10-CM | POA: Diagnosis not present

## 2022-04-01 DIAGNOSIS — Z992 Dependence on renal dialysis: Secondary | ICD-10-CM | POA: Diagnosis not present

## 2022-04-01 DIAGNOSIS — E1129 Type 2 diabetes mellitus with other diabetic kidney complication: Secondary | ICD-10-CM | POA: Diagnosis not present

## 2022-04-01 DIAGNOSIS — D689 Coagulation defect, unspecified: Secondary | ICD-10-CM | POA: Diagnosis not present

## 2022-04-01 DIAGNOSIS — D631 Anemia in chronic kidney disease: Secondary | ICD-10-CM | POA: Diagnosis not present

## 2022-04-03 DIAGNOSIS — Z992 Dependence on renal dialysis: Secondary | ICD-10-CM | POA: Diagnosis not present

## 2022-04-03 DIAGNOSIS — N186 End stage renal disease: Secondary | ICD-10-CM | POA: Diagnosis not present

## 2022-04-03 DIAGNOSIS — D631 Anemia in chronic kidney disease: Secondary | ICD-10-CM | POA: Diagnosis not present

## 2022-04-03 DIAGNOSIS — D689 Coagulation defect, unspecified: Secondary | ICD-10-CM | POA: Diagnosis not present

## 2022-04-03 DIAGNOSIS — E1129 Type 2 diabetes mellitus with other diabetic kidney complication: Secondary | ICD-10-CM | POA: Diagnosis not present

## 2022-04-03 DIAGNOSIS — N2581 Secondary hyperparathyroidism of renal origin: Secondary | ICD-10-CM | POA: Diagnosis not present

## 2022-04-06 DIAGNOSIS — D631 Anemia in chronic kidney disease: Secondary | ICD-10-CM | POA: Diagnosis not present

## 2022-04-06 DIAGNOSIS — N2581 Secondary hyperparathyroidism of renal origin: Secondary | ICD-10-CM | POA: Diagnosis not present

## 2022-04-06 DIAGNOSIS — E1129 Type 2 diabetes mellitus with other diabetic kidney complication: Secondary | ICD-10-CM | POA: Diagnosis not present

## 2022-04-06 DIAGNOSIS — D689 Coagulation defect, unspecified: Secondary | ICD-10-CM | POA: Diagnosis not present

## 2022-04-06 DIAGNOSIS — N186 End stage renal disease: Secondary | ICD-10-CM | POA: Diagnosis not present

## 2022-04-06 DIAGNOSIS — Z992 Dependence on renal dialysis: Secondary | ICD-10-CM | POA: Diagnosis not present

## 2022-04-07 ENCOUNTER — Ambulatory Visit (INDEPENDENT_AMBULATORY_CARE_PROVIDER_SITE_OTHER): Payer: Medicare Other | Admitting: Vascular Surgery

## 2022-04-07 ENCOUNTER — Encounter: Payer: Self-pay | Admitting: Vascular Surgery

## 2022-04-07 VITALS — BP 132/79 | HR 83 | Temp 98.4°F | Resp 20 | Ht 72.0 in | Wt 173.0 lb

## 2022-04-07 DIAGNOSIS — N186 End stage renal disease: Secondary | ICD-10-CM

## 2022-04-07 NOTE — Progress Notes (Signed)
Patient ID: Christopher Lopez, male   DOB: Dec 03, 1960, 61 y.o.   MRN: [id number]  Reason for Consult: New Patient (Initial Visit)   Referred by Dwana Melena, MD  Subjective:     HPI:  Christopher Lopez is a 61 y.o. male with esrd dialyzing via left arm AV graft that has been present for several years.  He does have some thinning skin has been evaluated for possible intervention.  Denies any bleeding states the fistula continues to work 3 days/week.  He is accompanied by his older brother today.  Past Medical History:  Diagnosis Date   Alcohol abuse 09/18/2011   Allergic rhinitis, cause unspecified 09/20/2011   Anemia, unspecified 09/18/2011   Arthritis    Childhood asthma 09/20/2011   CKD (chronic kidney disease) stage 5, GFR less than 15 ml/min (HCC)    Dialysis - T/Th/Sa   Colitis 09/18/2011   Dementia ([place]) 09/18/2011   Diabetes mellitus    Diverticulosis of colon without hemorrhage 02/27/2014   Foot ulcer ([place]) 09/18/2011   High cholesterol    HTN (hypertension) 09/18/2011   Hyperlipidemia 11/08/2011   Hypertension    Impaired glucose tolerance 09/18/2011   Pneumonia 09/18/2011   Pre-ulcerative corn or callous 11/20/2011   Stroke ([place])    left leg deficit   Type II or unspecified type diabetes mellitus without mention of complication, uncontrolled 09/20/2011   Wears glasses    Family History  Problem Relation Age of Onset   Prostate cancer Paternal Grandfather    Diabetes Mother    Heart disease Mother    Transient ischemic attack Mother    Diabetes Sister    Diabetes Maternal Grandfather    Colon cancer Neg Hx    Past Surgical History:  Procedure Laterality Date   AV FISTULA PLACEMENT Left 12/23/2017   Procedure: CREATION OF BASILIAC - CEPHALIC  ARTERIOVENOUS (AV) FISTULA  STAGE ONE;  Surgeon: Waynetta Sandy, MD;  Location: [place];  Service: Vascular;  Laterality: Left;   AV FISTULA PLACEMENT Left 07/26/2018   Procedure: INSERTION OF ARTERIOVENOUS (AV) GORE-TEX GRAFT ARM;   Surgeon: Waynetta Sandy, MD;  Location: [place];  Service: Vascular;  Laterality: Left;   [place] Left 03/01/2018   Procedure: BASILIC VEIN TRANSPOSITION SECOND STAGE;  Surgeon: Waynetta Sandy, MD;  Location: [place];  Service: Vascular;  Laterality: Left;   INSERTION OF DIALYSIS CATHETER Right 12/23/2017   Procedure: INSERTION OF PALINDROME  DIALYSIS CATHETER;  Surgeon: Waynetta Sandy, MD;  Location: [place];  Service: Vascular;  Laterality: Right;   THROMBECTOMY BRACHIAL ARTERY Left 07/26/2018   Procedure: THROMBECTOMY LEFT UPPER EXTREMITY;  Surgeon: Waynetta Sandy, MD;  Location: [place];  Service: Vascular;  Laterality: Left;   [place] Left 07/26/2018   Procedure: STENT LEFT AXILLARY VEIN;  Surgeon: Waynetta Sandy, MD;  Location: [place];  Service: Vascular;  Laterality: Left;    Short Social History:  Social History   Tobacco Use   Smoking status: Former    Packs/day: 0.50    Types: Cigarettes   Smokeless tobacco: Never  Substance Use Topics   Alcohol use: Not Currently    Alcohol/week: 2.0 standard drinks of alcohol    Types: 2 Cans of beer per week    Allergies  Allergen Reactions   Lipitor [Atorvastatin] Other (See Comments)    Current Outpatient Medications  Medication Sig Dispense Refill   acetaminophen (TYLENOL) 650 MG CR  tablet Take by mouth.     aspirin 81 MG EC tablet Take by mouth.     cyanocobalamin 1000 MCG tablet Take by mouth.     diltiazem (CARDIZEM LA) 360 MG 24 hr tablet Take by mouth.     diphenhydrAMINE (BENADRYL) 25 mg capsule Take by mouth.     Dulaglutide (TRULICITY) 1.5 QT/6.2UQ SOPN INJECT 0.5 ML INTO THE SKIN ONCE A WEEK 16 mL 0   ferric citrate (AURYXIA) 1 GM 210 MG(Fe) tablet Take by mouth.     glucose blood (ONE TOUCH ULTRA TEST) test strip Use as instructed once daily E11.9 100 each 12   hydrALAZINE (APRESOLINE) 100 MG tablet Take by mouth.      ketoconazole (NIZORAL) 2 % cream Apply topically.     Lancets (ONETOUCH ULTRASOFT) lancets 1 each by Other route 2 (two) times daily. Use to check blood sugars once a day Dx e11.9 100 each 3   lovastatin (MEVACOR) 40 MG tablet Take 1 tablet (40 mg total) by mouth at bedtime. 90 tablet 2   metoprolol succinate (TOPROL-XL) 50 MG 24 hr tablet Take 1 tablet (50 mg total) by mouth daily. Take with or immediately following a meal. 90 tablet 3   Multiple Vitamin (MULTIVITAMIN ADULT PO) Take by mouth.     oxyCODONE-acetaminophen (PERCOCET) 5-325 MG tablet Take 1 tablet by mouth every 6 (six) hours as needed for severe pain. 20 tablet 0   pioglitazone (ACTOS) 15 MG tablet Take 1 tablet (15 mg total) by mouth daily. 90 tablet 0   rosuvastatin (CRESTOR) 20 MG tablet Take 1 tablet (20 mg total) by mouth daily. 90 tablet 3   sitaGLIPtin (JANUVIA) 100 MG tablet Take by mouth.     No current facility-administered medications for this visit.    Review of Systems  Constitutional:  Constitutional negative. HENT: HENT negative.  Eyes: Eyes negative.  Respiratory: Respiratory negative.  Cardiovascular: Cardiovascular negative.  GI: Gastrointestinal negative.  Musculoskeletal: Musculoskeletal negative.       Walks with a cane Skin: Skin negative.  Neurological: Neurological negative. Hematologic: Hematologic/lymphatic negative.  Psychiatric: Psychiatric negative.        Objective:  Objective   Vitals:   04/07/22 1034  BP: 132/79  Pulse: 83  Resp: 20  Temp: 98.4 F (36.9 C)  SpO2: 98%  Weight: 173 lb (78.5 kg)  Height: 6' (1.829 m)   Body mass index is 23.46 kg/m.  Physical Exam HENT:     Head: Normocephalic.     Mouth/Throat:     Mouth: Mucous membranes are moist.  Eyes:     Pupils: Pupils are equal, round, and reactive to light.  Cardiovascular:     Pulses:          Radial pulses are 2+ on the right side and 2+ on the left side.  Pulmonary:     Effort: Pulmonary effort is  normal.  Abdominal:     General: Abdomen is flat.  Musculoskeletal:     Comments: Left arm avf with areas of loss of melanin but skin is able to be pinched over graft  Skin:    Capillary Refill: Capillary refill takes less than 2 seconds.  Neurological:     General: No focal deficit present.     Mental Status: He is alert.  Psychiatric:        Mood and Affect: Mood normal.     Data: No studies today     Assessment/Plan:    61 year old male with  end-stage renal disease dialyzing via left arm AV graft with thin skin but I am able to pinch the skin there is no evidence of any scabbing he has not had any bleeding issues.  For this reason we will not pursue any intervention at this time he can see me on an as-needed basis.     Waynetta Sandy MD [facility]

## 2022-04-08 DIAGNOSIS — E1129 Type 2 diabetes mellitus with other diabetic kidney complication: Secondary | ICD-10-CM | POA: Diagnosis not present

## 2022-04-08 DIAGNOSIS — N186 End stage renal disease: Secondary | ICD-10-CM | POA: Diagnosis not present

## 2022-04-08 DIAGNOSIS — N2581 Secondary hyperparathyroidism of renal origin: Secondary | ICD-10-CM | POA: Diagnosis not present

## 2022-04-08 DIAGNOSIS — D631 Anemia in chronic kidney disease: Secondary | ICD-10-CM | POA: Diagnosis not present

## 2022-04-08 DIAGNOSIS — Z992 Dependence on renal dialysis: Secondary | ICD-10-CM | POA: Diagnosis not present

## 2022-04-08 DIAGNOSIS — D689 Coagulation defect, unspecified: Secondary | ICD-10-CM | POA: Diagnosis not present

## 2022-04-10 DIAGNOSIS — E1122 Type 2 diabetes mellitus with diabetic chronic kidney disease: Secondary | ICD-10-CM | POA: Diagnosis not present

## 2022-04-10 DIAGNOSIS — N186 End stage renal disease: Secondary | ICD-10-CM | POA: Diagnosis not present

## 2022-04-10 DIAGNOSIS — D689 Coagulation defect, unspecified: Secondary | ICD-10-CM | POA: Diagnosis not present

## 2022-04-10 DIAGNOSIS — D631 Anemia in chronic kidney disease: Secondary | ICD-10-CM | POA: Diagnosis not present

## 2022-04-10 DIAGNOSIS — N2581 Secondary hyperparathyroidism of renal origin: Secondary | ICD-10-CM | POA: Diagnosis not present

## 2022-04-10 DIAGNOSIS — E1129 Type 2 diabetes mellitus with other diabetic kidney complication: Secondary | ICD-10-CM | POA: Diagnosis not present

## 2022-04-10 DIAGNOSIS — Z992 Dependence on renal dialysis: Secondary | ICD-10-CM | POA: Diagnosis not present

## 2022-04-13 DIAGNOSIS — E1129 Type 2 diabetes mellitus with other diabetic kidney complication: Secondary | ICD-10-CM | POA: Diagnosis not present

## 2022-04-13 DIAGNOSIS — D631 Anemia in chronic kidney disease: Secondary | ICD-10-CM | POA: Diagnosis not present

## 2022-04-13 DIAGNOSIS — Z992 Dependence on renal dialysis: Secondary | ICD-10-CM | POA: Diagnosis not present

## 2022-04-13 DIAGNOSIS — N2581 Secondary hyperparathyroidism of renal origin: Secondary | ICD-10-CM | POA: Diagnosis not present

## 2022-04-13 DIAGNOSIS — D689 Coagulation defect, unspecified: Secondary | ICD-10-CM | POA: Diagnosis not present

## 2022-04-13 DIAGNOSIS — N186 End stage renal disease: Secondary | ICD-10-CM | POA: Diagnosis not present

## 2022-04-14 ENCOUNTER — Ambulatory Visit (INDEPENDENT_AMBULATORY_CARE_PROVIDER_SITE_OTHER): Payer: Medicare Other | Admitting: Internal Medicine

## 2022-04-14 VITALS — BP 128/74 | HR 86 | Ht 72.0 in | Wt 176.0 lb

## 2022-04-14 DIAGNOSIS — E782 Mixed hyperlipidemia: Secondary | ICD-10-CM

## 2022-04-14 DIAGNOSIS — I10 Essential (primary) hypertension: Secondary | ICD-10-CM

## 2022-04-14 DIAGNOSIS — F0390 Unspecified dementia without behavioral disturbance: Secondary | ICD-10-CM | POA: Diagnosis not present

## 2022-04-14 DIAGNOSIS — Z23 Encounter for immunization: Secondary | ICD-10-CM

## 2022-04-14 DIAGNOSIS — E1129 Type 2 diabetes mellitus with other diabetic kidney complication: Secondary | ICD-10-CM | POA: Diagnosis not present

## 2022-04-14 LAB — POCT GLYCOSYLATED HEMOGLOBIN (HGB A1C): Hemoglobin A1C: 6.3 % — AB (ref 4.0–5.6)

## 2022-04-14 NOTE — Patient Instructions (Addendum)
Please have your Shingrix (shingles) shots done at your local pharmacy.  You had the flu shot today  Your A1c test was done today  Please continue all other medications as before, and refills have been done if requested.  Please have the pharmacy call with any other refills you may need.  Please continue your efforts at being more active, low cholesterol diet, and weight control.  Please keep your appointments with your specialists as you may have planned  Please make an Appointment to return in 6 months, or sooner if needed

## 2022-04-14 NOTE — Progress Notes (Signed)
Patient ID: Christopher Lopez, male   DOB: [birth year], 61 y.o.   MRN: [id number]        Chief Complaint: follow up HTN, HLD and hyperglycemia, dementia       HPI:  Christopher Lopez is a 61 y.o. male here overall doing ok; Pt denies chest pain, increased sob or doe, wheezing, orthopnea, PND, increased LE swelling, palpitations, dizziness or syncope.   Pt denies polydipsia, polyuria, or new focal neuro s/s.    Pt denies fever, wt loss, night sweats, loss of appetite, or other constitutional symptoms  Due for flu shot, to get shingrix at pharmacy.  Dementia overall stable symptomatically, and not assoc with behavioral changes such as hallucinations, paranoia, or agitation  ETOH use in remission       Wt Readings from Last 3 Encounters:  04/14/22 176 lb (79.8 kg)  04/07/22 173 lb (78.5 kg)  10/12/21 179 lb (81.2 kg)   BP Readings from Last 3 Encounters:  04/14/22 128/74  04/07/22 132/79  10/12/21 (!) 152/76         Past Medical History:  Diagnosis Date   Alcohol abuse 09/18/2011   Allergic rhinitis, cause unspecified 09/20/2011   Anemia, unspecified 09/18/2011   Arthritis    Childhood asthma 09/20/2011   CKD (chronic kidney disease) stage 5, GFR less than 15 ml/min (HCC)    Dialysis - T/Th/Sa   Colitis 09/18/2011   Dementia ([place]) 09/18/2011   Diabetes mellitus    Diverticulosis of colon without hemorrhage 02/27/2014   Foot ulcer ([place]) 09/18/2011   High cholesterol    HTN (hypertension) 09/18/2011   Hyperlipidemia 11/08/2011   Hypertension    Impaired glucose tolerance 09/18/2011   Pneumonia 09/18/2011   Pre-ulcerative corn or callous 11/20/2011   Stroke ([place])    left leg deficit   Type II or unspecified type diabetes mellitus without mention of complication, uncontrolled 09/20/2011   Wears glasses    Past Surgical History:  Procedure Laterality Date   AV FISTULA PLACEMENT Left 12/23/2017   Procedure: CREATION OF BASILIAC - CEPHALIC  ARTERIOVENOUS (AV) FISTULA  STAGE ONE;  Surgeon: Waynetta Sandy, MD;  Location: [place];  Service: Vascular;  Laterality: Left;   AV FISTULA PLACEMENT Left 07/26/2018   Procedure: INSERTION OF ARTERIOVENOUS (AV) GORE-TEX GRAFT ARM;  Surgeon: Waynetta Sandy, MD;  Location: [place];  Service: Vascular;  Laterality: Left;   [place] Left 03/01/2018   Procedure: BASILIC VEIN TRANSPOSITION SECOND STAGE;  Surgeon: Waynetta Sandy, MD;  Location: [place];  Service: Vascular;  Laterality: Left;   INSERTION OF DIALYSIS CATHETER Right 12/23/2017   Procedure: INSERTION OF PALINDROME  DIALYSIS CATHETER;  Surgeon: Waynetta Sandy, MD;  Location: [place];  Service: Vascular;  Laterality: Right;   THROMBECTOMY BRACHIAL ARTERY Left 07/26/2018   Procedure: THROMBECTOMY LEFT UPPER EXTREMITY;  Surgeon: Waynetta Sandy, MD;  Location: [place];  Service: Vascular;  Laterality: Left;   [place] Left 07/26/2018   Procedure: STENT LEFT AXILLARY VEIN;  Surgeon: Waynetta Sandy, MD;  Location: [place];  Service: Vascular;  Laterality: Left;    reports that he has quit smoking. His smoking use included cigarettes. He smoked an average of .5 packs per day. He has never used smokeless tobacco. He reports that he does not currently use alcohol after a past usage of about 2.0 standard drinks of alcohol per week. He reports that he does not use drugs. family  history includes Diabetes in his maternal grandfather, mother, and sister; Heart disease in his mother; Prostate cancer in his paternal grandfather; Transient ischemic attack in his mother. Allergies  Allergen Reactions   Lipitor [Atorvastatin] Other (See Comments)   Current Outpatient Medications on File Prior to Visit  Medication Sig Dispense Refill   acetaminophen (TYLENOL) 650 MG CR tablet Take by mouth.     aspirin 81 MG EC tablet Take by mouth.     cyanocobalamin 1000 MCG tablet Take by mouth.     diltiazem (CARDIZEM LA) 360 MG 24 hr  tablet Take by mouth.     diphenhydrAMINE (BENADRYL) 25 mg capsule Take by mouth.     Dulaglutide (TRULICITY) 1.5 UQ/3.3HL SOPN INJECT 0.5 ML INTO THE SKIN ONCE A WEEK 16 mL 0   ferric citrate (AURYXIA) 1 GM 210 MG(Fe) tablet Take by mouth.     glucose blood (ONE TOUCH ULTRA TEST) test strip Use as instructed once daily E11.9 100 each 12   hydrALAZINE (APRESOLINE) 100 MG tablet Take by mouth.     ketoconazole (NIZORAL) 2 % cream Apply topically.     Lancets (ONETOUCH ULTRASOFT) lancets 1 each by Other route 2 (two) times daily. Use to check blood sugars once a day Dx e11.9 100 each 3   lovastatin (MEVACOR) 40 MG tablet Take 1 tablet (40 mg total) by mouth at bedtime. 90 tablet 2   metoprolol succinate (TOPROL-XL) 50 MG 24 hr tablet Take 1 tablet (50 mg total) by mouth daily. Take with or immediately following a meal. 90 tablet 3   Multiple Vitamin (MULTIVITAMIN ADULT PO) Take by mouth.     oxyCODONE-acetaminophen (PERCOCET) 5-325 MG tablet Take 1 tablet by mouth every 6 (six) hours as needed for severe pain. 20 tablet 0   pioglitazone (ACTOS) 15 MG tablet Take 1 tablet (15 mg total) by mouth daily. 90 tablet 0   rosuvastatin (CRESTOR) 20 MG tablet Take 1 tablet (20 mg total) by mouth daily. 90 tablet 3   sitaGLIPtin (JANUVIA) 100 MG tablet Take by mouth.     No current facility-administered medications on file prior to visit.        ROS:  All others reviewed and negative.  Objective        PE:  BP 128/74 (BP Location: Right Arm, Patient Position: Sitting, Cuff Size: Large)   Pulse 86   Ht 6' (1.829 m)   Wt 176 lb (79.8 kg)   SpO2 99%   BMI 23.87 kg/m                 Constitutional: Pt appears in NAD               HENT: Head: NCAT.                Right Ear: External ear normal.                 Left Ear: External ear normal.                Eyes: . Pupils are equal, round, and reactive to light. Conjunctivae and EOM are normal               Nose: without d/c or deformity                Neck: Neck supple. Gross normal ROM               Cardiovascular: Normal rate and regular rhythm.  Pulmonary/Chest: Effort normal and breath sounds without rales or wheezing.                Abd:  Soft, NT, ND, + BS, no organomegaly               Neurological: Pt is alert. At baseline orientation, motor grossly intact               Skin: Skin is warm. No rashes, no other new lesions, LE edema - none               Psychiatric: Pt behavior is normal without agitation   Micro: none  Cardiac tracings I have personally interpreted today:  none  Pertinent Radiological findings (summarize): none   Lab Results  Component Value Date   WBC 9.8 10/12/2021   HGB 11.1 (L) 10/12/2021   HCT 33.2 (L) 10/12/2021   PLT 263.0 10/12/2021   GLUCOSE 149 (H) 10/12/2021   CHOL 155 10/12/2021   TRIG 131.0 10/12/2021   HDL 34.90 (L) 10/12/2021   LDLDIRECT 131.0 08/11/2017   LDLCALC 94 10/12/2021   ALT 10 10/12/2021   AST 14 10/12/2021   NA 134 (L) 10/12/2021   K 4.6 10/12/2021   CL 93 (L) 10/12/2021   CREATININE 10.28 (HH) 10/12/2021   BUN 40 (H) 10/12/2021   CO2 29 10/12/2021   TSH 1.82 10/12/2021   PSA 0.73 10/12/2021   HGBA1C 6.3 (A) 04/14/2022   MICROALBUR 98.3 (H) 08/11/2017   Hemoglobin A1C 4.0 - 5.6 % 6.3 Abnormal   6.4 R, CM  6.0 R, CM  5.9   Assessment/Plan:  Christopher Lopez is a 61 y.o. Black or African American [2] male with  has a past medical history of Alcohol abuse (09/18/2011), Allergic rhinitis, cause unspecified (09/20/2011), Anemia, unspecified (09/18/2011), Arthritis, Childhood asthma (09/20/2011), CKD (chronic kidney disease) stage 5, GFR less than 15 ml/min ([place]), Colitis (09/18/2011), Dementia ([place]) (09/18/2011), Diabetes mellitus, Diverticulosis of colon without hemorrhage (02/27/2014), Foot ulcer ([place]) (09/18/2011), High cholesterol, HTN (hypertension) (09/18/2011), Hyperlipidemia (11/08/2011), Hypertension, Impaired glucose tolerance (09/18/2011), Pneumonia (09/18/2011),  Pre-ulcerative corn or callous (11/20/2011), Stroke ([place]), Type II or unspecified type diabetes mellitus without mention of complication, uncontrolled (09/20/2011), and Wears glasses.  Type 2 diabetes mellitus with other diabetic kidney complication (HCC) Lab Results  Component Value Date   HGBA1C 6.3 (A) 04/14/2022   Stable, pt to continue current medical treatment trulicity 1.5, actos 15 mg, januvia 100 mg qd    Hyperlipidemia, unspecified Lab Results  Component Value Date   LDLCALC 94 10/12/2021   Uncontrolled, goal ldl < 70,, pt to continue current statin lovastatin 40 mg - declines further change for now, cont low chol DM diet   Essential (primary) hypertension BP Readings from Last 3 Encounters:  04/14/22 128/74  04/07/22 132/79  10/12/21 (!) 152/76   Stable, pt to continue medical treatment cardizem 360 mg, hydralazine 100 , toprol xl 50 mg qd    Dementia without behavioral disturbance ([place]) Thought due to ETOH in past now in remission, stable, continue to follow Followup: Return in about 6 months (around 10/14/2022).  Cathlean Cower, MD 04/17/2022 2:42 PM [facility]

## 2022-04-15 DIAGNOSIS — Z992 Dependence on renal dialysis: Secondary | ICD-10-CM | POA: Diagnosis not present

## 2022-04-15 DIAGNOSIS — E1129 Type 2 diabetes mellitus with other diabetic kidney complication: Secondary | ICD-10-CM | POA: Diagnosis not present

## 2022-04-15 DIAGNOSIS — N2581 Secondary hyperparathyroidism of renal origin: Secondary | ICD-10-CM | POA: Diagnosis not present

## 2022-04-15 DIAGNOSIS — D631 Anemia in chronic kidney disease: Secondary | ICD-10-CM | POA: Diagnosis not present

## 2022-04-15 DIAGNOSIS — D689 Coagulation defect, unspecified: Secondary | ICD-10-CM | POA: Diagnosis not present

## 2022-04-15 DIAGNOSIS — N186 End stage renal disease: Secondary | ICD-10-CM | POA: Diagnosis not present

## 2022-04-17 ENCOUNTER — Encounter: Payer: Self-pay | Admitting: Internal Medicine

## 2022-04-17 DIAGNOSIS — N186 End stage renal disease: Secondary | ICD-10-CM | POA: Diagnosis not present

## 2022-04-17 DIAGNOSIS — D689 Coagulation defect, unspecified: Secondary | ICD-10-CM | POA: Diagnosis not present

## 2022-04-17 DIAGNOSIS — E1129 Type 2 diabetes mellitus with other diabetic kidney complication: Secondary | ICD-10-CM | POA: Diagnosis not present

## 2022-04-17 DIAGNOSIS — N2581 Secondary hyperparathyroidism of renal origin: Secondary | ICD-10-CM | POA: Diagnosis not present

## 2022-04-17 DIAGNOSIS — Z992 Dependence on renal dialysis: Secondary | ICD-10-CM | POA: Diagnosis not present

## 2022-04-17 DIAGNOSIS — D631 Anemia in chronic kidney disease: Secondary | ICD-10-CM | POA: Diagnosis not present

## 2022-04-17 NOTE — Assessment & Plan Note (Signed)
Thought due to ETOH in past now in remission, stable, continue to follow

## 2022-04-17 NOTE — Assessment & Plan Note (Signed)
Lab Results  Component Value Date   LDLCALC 94 10/12/2021   Uncontrolled, goal ldl < 70,, pt to continue current statin lovastatin 40 mg - declines further change for now, cont low chol DM diet

## 2022-04-17 NOTE — Assessment & Plan Note (Signed)
Lab Results  Component Value Date   HGBA1C 6.3 (A) 04/14/2022   Stable, pt to continue current medical treatment trulicity 1.5, actos 15 mg, januvia 100 mg qd

## 2022-04-17 NOTE — Assessment & Plan Note (Signed)
BP Readings from Last 3 Encounters:  04/14/22 128/74  04/07/22 132/79  10/12/21 (!) 152/76   Stable, pt to continue medical treatment cardizem 360 mg, hydralazine 100 , toprol xl 50 mg qd

## 2022-04-20 DIAGNOSIS — D689 Coagulation defect, unspecified: Secondary | ICD-10-CM | POA: Diagnosis not present

## 2022-04-20 DIAGNOSIS — N2581 Secondary hyperparathyroidism of renal origin: Secondary | ICD-10-CM | POA: Diagnosis not present

## 2022-04-20 DIAGNOSIS — E1129 Type 2 diabetes mellitus with other diabetic kidney complication: Secondary | ICD-10-CM | POA: Diagnosis not present

## 2022-04-20 DIAGNOSIS — Z992 Dependence on renal dialysis: Secondary | ICD-10-CM | POA: Diagnosis not present

## 2022-04-20 DIAGNOSIS — D631 Anemia in chronic kidney disease: Secondary | ICD-10-CM | POA: Diagnosis not present

## 2022-04-20 DIAGNOSIS — N186 End stage renal disease: Secondary | ICD-10-CM | POA: Diagnosis not present

## 2022-04-22 DIAGNOSIS — Z992 Dependence on renal dialysis: Secondary | ICD-10-CM | POA: Diagnosis not present

## 2022-04-22 DIAGNOSIS — N186 End stage renal disease: Secondary | ICD-10-CM | POA: Diagnosis not present

## 2022-04-22 DIAGNOSIS — N2581 Secondary hyperparathyroidism of renal origin: Secondary | ICD-10-CM | POA: Diagnosis not present

## 2022-04-22 DIAGNOSIS — E1129 Type 2 diabetes mellitus with other diabetic kidney complication: Secondary | ICD-10-CM | POA: Diagnosis not present

## 2022-04-22 DIAGNOSIS — D689 Coagulation defect, unspecified: Secondary | ICD-10-CM | POA: Diagnosis not present

## 2022-04-22 DIAGNOSIS — D631 Anemia in chronic kidney disease: Secondary | ICD-10-CM | POA: Diagnosis not present

## 2022-04-24 DIAGNOSIS — Z992 Dependence on renal dialysis: Secondary | ICD-10-CM | POA: Diagnosis not present

## 2022-04-24 DIAGNOSIS — E1129 Type 2 diabetes mellitus with other diabetic kidney complication: Secondary | ICD-10-CM | POA: Diagnosis not present

## 2022-04-24 DIAGNOSIS — D689 Coagulation defect, unspecified: Secondary | ICD-10-CM | POA: Diagnosis not present

## 2022-04-24 DIAGNOSIS — N2581 Secondary hyperparathyroidism of renal origin: Secondary | ICD-10-CM | POA: Diagnosis not present

## 2022-04-24 DIAGNOSIS — N186 End stage renal disease: Secondary | ICD-10-CM | POA: Diagnosis not present

## 2022-04-24 DIAGNOSIS — D631 Anemia in chronic kidney disease: Secondary | ICD-10-CM | POA: Diagnosis not present

## 2022-04-27 DIAGNOSIS — D631 Anemia in chronic kidney disease: Secondary | ICD-10-CM | POA: Diagnosis not present

## 2022-04-27 DIAGNOSIS — E1129 Type 2 diabetes mellitus with other diabetic kidney complication: Secondary | ICD-10-CM | POA: Diagnosis not present

## 2022-04-27 DIAGNOSIS — D689 Coagulation defect, unspecified: Secondary | ICD-10-CM | POA: Diagnosis not present

## 2022-04-27 DIAGNOSIS — N2581 Secondary hyperparathyroidism of renal origin: Secondary | ICD-10-CM | POA: Diagnosis not present

## 2022-04-27 DIAGNOSIS — N186 End stage renal disease: Secondary | ICD-10-CM | POA: Diagnosis not present

## 2022-04-27 DIAGNOSIS — Z992 Dependence on renal dialysis: Secondary | ICD-10-CM | POA: Diagnosis not present

## 2022-04-29 DIAGNOSIS — N2581 Secondary hyperparathyroidism of renal origin: Secondary | ICD-10-CM | POA: Diagnosis not present

## 2022-04-29 DIAGNOSIS — D631 Anemia in chronic kidney disease: Secondary | ICD-10-CM | POA: Diagnosis not present

## 2022-04-29 DIAGNOSIS — E1129 Type 2 diabetes mellitus with other diabetic kidney complication: Secondary | ICD-10-CM | POA: Diagnosis not present

## 2022-04-29 DIAGNOSIS — Z992 Dependence on renal dialysis: Secondary | ICD-10-CM | POA: Diagnosis not present

## 2022-04-29 DIAGNOSIS — N186 End stage renal disease: Secondary | ICD-10-CM | POA: Diagnosis not present

## 2022-04-29 DIAGNOSIS — D689 Coagulation defect, unspecified: Secondary | ICD-10-CM | POA: Diagnosis not present

## 2022-05-01 DIAGNOSIS — Z992 Dependence on renal dialysis: Secondary | ICD-10-CM | POA: Diagnosis not present

## 2022-05-01 DIAGNOSIS — E1129 Type 2 diabetes mellitus with other diabetic kidney complication: Secondary | ICD-10-CM | POA: Diagnosis not present

## 2022-05-01 DIAGNOSIS — D689 Coagulation defect, unspecified: Secondary | ICD-10-CM | POA: Diagnosis not present

## 2022-05-01 DIAGNOSIS — N186 End stage renal disease: Secondary | ICD-10-CM | POA: Diagnosis not present

## 2022-05-01 DIAGNOSIS — N2581 Secondary hyperparathyroidism of renal origin: Secondary | ICD-10-CM | POA: Diagnosis not present

## 2022-05-01 DIAGNOSIS — D631 Anemia in chronic kidney disease: Secondary | ICD-10-CM | POA: Diagnosis not present

## 2022-05-04 DIAGNOSIS — Z992 Dependence on renal dialysis: Secondary | ICD-10-CM | POA: Diagnosis not present

## 2022-05-04 DIAGNOSIS — N186 End stage renal disease: Secondary | ICD-10-CM | POA: Diagnosis not present

## 2022-05-04 DIAGNOSIS — D689 Coagulation defect, unspecified: Secondary | ICD-10-CM | POA: Diagnosis not present

## 2022-05-04 DIAGNOSIS — E1129 Type 2 diabetes mellitus with other diabetic kidney complication: Secondary | ICD-10-CM | POA: Diagnosis not present

## 2022-05-04 DIAGNOSIS — D631 Anemia in chronic kidney disease: Secondary | ICD-10-CM | POA: Diagnosis not present

## 2022-05-04 DIAGNOSIS — N2581 Secondary hyperparathyroidism of renal origin: Secondary | ICD-10-CM | POA: Diagnosis not present

## 2022-05-06 DIAGNOSIS — Z992 Dependence on renal dialysis: Secondary | ICD-10-CM | POA: Diagnosis not present

## 2022-05-06 DIAGNOSIS — E1129 Type 2 diabetes mellitus with other diabetic kidney complication: Secondary | ICD-10-CM | POA: Diagnosis not present

## 2022-05-06 DIAGNOSIS — N2581 Secondary hyperparathyroidism of renal origin: Secondary | ICD-10-CM | POA: Diagnosis not present

## 2022-05-06 DIAGNOSIS — D631 Anemia in chronic kidney disease: Secondary | ICD-10-CM | POA: Diagnosis not present

## 2022-05-06 DIAGNOSIS — D689 Coagulation defect, unspecified: Secondary | ICD-10-CM | POA: Diagnosis not present

## 2022-05-06 DIAGNOSIS — N186 End stage renal disease: Secondary | ICD-10-CM | POA: Diagnosis not present

## 2022-05-08 DIAGNOSIS — D689 Coagulation defect, unspecified: Secondary | ICD-10-CM | POA: Diagnosis not present

## 2022-05-08 DIAGNOSIS — Z992 Dependence on renal dialysis: Secondary | ICD-10-CM | POA: Diagnosis not present

## 2022-05-08 DIAGNOSIS — E1129 Type 2 diabetes mellitus with other diabetic kidney complication: Secondary | ICD-10-CM | POA: Diagnosis not present

## 2022-05-08 DIAGNOSIS — D631 Anemia in chronic kidney disease: Secondary | ICD-10-CM | POA: Diagnosis not present

## 2022-05-08 DIAGNOSIS — N186 End stage renal disease: Secondary | ICD-10-CM | POA: Diagnosis not present

## 2022-05-08 DIAGNOSIS — N2581 Secondary hyperparathyroidism of renal origin: Secondary | ICD-10-CM | POA: Diagnosis not present

## 2022-05-11 DIAGNOSIS — D689 Coagulation defect, unspecified: Secondary | ICD-10-CM | POA: Diagnosis not present

## 2022-05-11 DIAGNOSIS — E1122 Type 2 diabetes mellitus with diabetic chronic kidney disease: Secondary | ICD-10-CM | POA: Diagnosis not present

## 2022-05-11 DIAGNOSIS — N2581 Secondary hyperparathyroidism of renal origin: Secondary | ICD-10-CM | POA: Diagnosis not present

## 2022-05-11 DIAGNOSIS — Z992 Dependence on renal dialysis: Secondary | ICD-10-CM | POA: Diagnosis not present

## 2022-05-11 DIAGNOSIS — E1129 Type 2 diabetes mellitus with other diabetic kidney complication: Secondary | ICD-10-CM | POA: Diagnosis not present

## 2022-05-11 DIAGNOSIS — D631 Anemia in chronic kidney disease: Secondary | ICD-10-CM | POA: Diagnosis not present

## 2022-05-11 DIAGNOSIS — N186 End stage renal disease: Secondary | ICD-10-CM | POA: Diagnosis not present

## 2022-05-13 DIAGNOSIS — E1129 Type 2 diabetes mellitus with other diabetic kidney complication: Secondary | ICD-10-CM | POA: Diagnosis not present

## 2022-05-13 DIAGNOSIS — N2581 Secondary hyperparathyroidism of renal origin: Secondary | ICD-10-CM | POA: Diagnosis not present

## 2022-05-13 DIAGNOSIS — D631 Anemia in chronic kidney disease: Secondary | ICD-10-CM | POA: Diagnosis not present

## 2022-05-13 DIAGNOSIS — D689 Coagulation defect, unspecified: Secondary | ICD-10-CM | POA: Diagnosis not present

## 2022-05-13 DIAGNOSIS — Z992 Dependence on renal dialysis: Secondary | ICD-10-CM | POA: Diagnosis not present

## 2022-05-13 DIAGNOSIS — Z23 Encounter for immunization: Secondary | ICD-10-CM | POA: Diagnosis not present

## 2022-05-13 DIAGNOSIS — N186 End stage renal disease: Secondary | ICD-10-CM | POA: Diagnosis not present

## 2022-05-15 DIAGNOSIS — Z992 Dependence on renal dialysis: Secondary | ICD-10-CM | POA: Diagnosis not present

## 2022-05-15 DIAGNOSIS — E1129 Type 2 diabetes mellitus with other diabetic kidney complication: Secondary | ICD-10-CM | POA: Diagnosis not present

## 2022-05-15 DIAGNOSIS — D631 Anemia in chronic kidney disease: Secondary | ICD-10-CM | POA: Diagnosis not present

## 2022-05-15 DIAGNOSIS — N2581 Secondary hyperparathyroidism of renal origin: Secondary | ICD-10-CM | POA: Diagnosis not present

## 2022-05-15 DIAGNOSIS — N186 End stage renal disease: Secondary | ICD-10-CM | POA: Diagnosis not present

## 2022-05-15 DIAGNOSIS — D689 Coagulation defect, unspecified: Secondary | ICD-10-CM | POA: Diagnosis not present

## 2022-05-15 DIAGNOSIS — Z23 Encounter for immunization: Secondary | ICD-10-CM | POA: Diagnosis not present

## 2022-05-18 DIAGNOSIS — N2581 Secondary hyperparathyroidism of renal origin: Secondary | ICD-10-CM | POA: Diagnosis not present

## 2022-05-18 DIAGNOSIS — E1129 Type 2 diabetes mellitus with other diabetic kidney complication: Secondary | ICD-10-CM | POA: Diagnosis not present

## 2022-05-18 DIAGNOSIS — N186 End stage renal disease: Secondary | ICD-10-CM | POA: Diagnosis not present

## 2022-05-18 DIAGNOSIS — Z23 Encounter for immunization: Secondary | ICD-10-CM | POA: Diagnosis not present

## 2022-05-18 DIAGNOSIS — D689 Coagulation defect, unspecified: Secondary | ICD-10-CM | POA: Diagnosis not present

## 2022-05-18 DIAGNOSIS — D631 Anemia in chronic kidney disease: Secondary | ICD-10-CM | POA: Diagnosis not present

## 2022-05-18 DIAGNOSIS — Z992 Dependence on renal dialysis: Secondary | ICD-10-CM | POA: Diagnosis not present

## 2022-05-20 DIAGNOSIS — N186 End stage renal disease: Secondary | ICD-10-CM | POA: Diagnosis not present

## 2022-05-20 DIAGNOSIS — N2581 Secondary hyperparathyroidism of renal origin: Secondary | ICD-10-CM | POA: Diagnosis not present

## 2022-05-20 DIAGNOSIS — Z992 Dependence on renal dialysis: Secondary | ICD-10-CM | POA: Diagnosis not present

## 2022-05-20 DIAGNOSIS — E1129 Type 2 diabetes mellitus with other diabetic kidney complication: Secondary | ICD-10-CM | POA: Diagnosis not present

## 2022-05-20 DIAGNOSIS — Z23 Encounter for immunization: Secondary | ICD-10-CM | POA: Diagnosis not present

## 2022-05-20 DIAGNOSIS — D689 Coagulation defect, unspecified: Secondary | ICD-10-CM | POA: Diagnosis not present

## 2022-05-20 DIAGNOSIS — D631 Anemia in chronic kidney disease: Secondary | ICD-10-CM | POA: Diagnosis not present

## 2022-05-22 DIAGNOSIS — N186 End stage renal disease: Secondary | ICD-10-CM | POA: Diagnosis not present

## 2022-05-22 DIAGNOSIS — Z23 Encounter for immunization: Secondary | ICD-10-CM | POA: Diagnosis not present

## 2022-05-22 DIAGNOSIS — E1129 Type 2 diabetes mellitus with other diabetic kidney complication: Secondary | ICD-10-CM | POA: Diagnosis not present

## 2022-05-22 DIAGNOSIS — D689 Coagulation defect, unspecified: Secondary | ICD-10-CM | POA: Diagnosis not present

## 2022-05-22 DIAGNOSIS — D631 Anemia in chronic kidney disease: Secondary | ICD-10-CM | POA: Diagnosis not present

## 2022-05-22 DIAGNOSIS — Z992 Dependence on renal dialysis: Secondary | ICD-10-CM | POA: Diagnosis not present

## 2022-05-22 DIAGNOSIS — N2581 Secondary hyperparathyroidism of renal origin: Secondary | ICD-10-CM | POA: Diagnosis not present

## 2022-05-25 DIAGNOSIS — E1129 Type 2 diabetes mellitus with other diabetic kidney complication: Secondary | ICD-10-CM | POA: Diagnosis not present

## 2022-05-25 DIAGNOSIS — Z992 Dependence on renal dialysis: Secondary | ICD-10-CM | POA: Diagnosis not present

## 2022-05-25 DIAGNOSIS — N186 End stage renal disease: Secondary | ICD-10-CM | POA: Diagnosis not present

## 2022-05-25 DIAGNOSIS — N2581 Secondary hyperparathyroidism of renal origin: Secondary | ICD-10-CM | POA: Diagnosis not present

## 2022-05-25 DIAGNOSIS — D631 Anemia in chronic kidney disease: Secondary | ICD-10-CM | POA: Diagnosis not present

## 2022-05-25 DIAGNOSIS — Z23 Encounter for immunization: Secondary | ICD-10-CM | POA: Diagnosis not present

## 2022-05-25 DIAGNOSIS — D689 Coagulation defect, unspecified: Secondary | ICD-10-CM | POA: Diagnosis not present

## 2022-05-27 DIAGNOSIS — D689 Coagulation defect, unspecified: Secondary | ICD-10-CM | POA: Diagnosis not present

## 2022-05-27 DIAGNOSIS — E1129 Type 2 diabetes mellitus with other diabetic kidney complication: Secondary | ICD-10-CM | POA: Diagnosis not present

## 2022-05-27 DIAGNOSIS — D631 Anemia in chronic kidney disease: Secondary | ICD-10-CM | POA: Diagnosis not present

## 2022-05-27 DIAGNOSIS — N2581 Secondary hyperparathyroidism of renal origin: Secondary | ICD-10-CM | POA: Diagnosis not present

## 2022-05-27 DIAGNOSIS — Z23 Encounter for immunization: Secondary | ICD-10-CM | POA: Diagnosis not present

## 2022-05-27 DIAGNOSIS — Z992 Dependence on renal dialysis: Secondary | ICD-10-CM | POA: Diagnosis not present

## 2022-05-27 DIAGNOSIS — N186 End stage renal disease: Secondary | ICD-10-CM | POA: Diagnosis not present

## 2022-05-29 DIAGNOSIS — D631 Anemia in chronic kidney disease: Secondary | ICD-10-CM | POA: Diagnosis not present

## 2022-05-29 DIAGNOSIS — E1129 Type 2 diabetes mellitus with other diabetic kidney complication: Secondary | ICD-10-CM | POA: Diagnosis not present

## 2022-05-29 DIAGNOSIS — D689 Coagulation defect, unspecified: Secondary | ICD-10-CM | POA: Diagnosis not present

## 2022-05-29 DIAGNOSIS — Z23 Encounter for immunization: Secondary | ICD-10-CM | POA: Diagnosis not present

## 2022-05-29 DIAGNOSIS — N186 End stage renal disease: Secondary | ICD-10-CM | POA: Diagnosis not present

## 2022-05-29 DIAGNOSIS — N2581 Secondary hyperparathyroidism of renal origin: Secondary | ICD-10-CM | POA: Diagnosis not present

## 2022-05-29 DIAGNOSIS — Z992 Dependence on renal dialysis: Secondary | ICD-10-CM | POA: Diagnosis not present

## 2022-05-31 ENCOUNTER — Other Ambulatory Visit: Payer: Self-pay

## 2022-05-31 DIAGNOSIS — E1129 Type 2 diabetes mellitus with other diabetic kidney complication: Secondary | ICD-10-CM | POA: Diagnosis not present

## 2022-05-31 DIAGNOSIS — Z23 Encounter for immunization: Secondary | ICD-10-CM | POA: Diagnosis not present

## 2022-05-31 DIAGNOSIS — Z992 Dependence on renal dialysis: Secondary | ICD-10-CM | POA: Diagnosis not present

## 2022-05-31 DIAGNOSIS — D689 Coagulation defect, unspecified: Secondary | ICD-10-CM | POA: Diagnosis not present

## 2022-05-31 DIAGNOSIS — N2581 Secondary hyperparathyroidism of renal origin: Secondary | ICD-10-CM | POA: Diagnosis not present

## 2022-05-31 DIAGNOSIS — N186 End stage renal disease: Secondary | ICD-10-CM | POA: Diagnosis not present

## 2022-05-31 DIAGNOSIS — D631 Anemia in chronic kidney disease: Secondary | ICD-10-CM | POA: Diagnosis not present

## 2022-05-31 MED ORDER — PIOGLITAZONE HCL 15 MG PO TABS: 15.0000 mg | ORAL_TABLET | Freq: Every day | ORAL | 0 refills | Status: DC

## 2022-06-02 ENCOUNTER — Ambulatory Visit: Payer: Medicare Other | Admitting: Podiatry

## 2022-06-02 DIAGNOSIS — N2581 Secondary hyperparathyroidism of renal origin: Secondary | ICD-10-CM | POA: Diagnosis not present

## 2022-06-02 DIAGNOSIS — N186 End stage renal disease: Secondary | ICD-10-CM | POA: Diagnosis not present

## 2022-06-02 DIAGNOSIS — D631 Anemia in chronic kidney disease: Secondary | ICD-10-CM | POA: Diagnosis not present

## 2022-06-02 DIAGNOSIS — D689 Coagulation defect, unspecified: Secondary | ICD-10-CM | POA: Diagnosis not present

## 2022-06-02 DIAGNOSIS — Z23 Encounter for immunization: Secondary | ICD-10-CM | POA: Diagnosis not present

## 2022-06-02 DIAGNOSIS — Z992 Dependence on renal dialysis: Secondary | ICD-10-CM | POA: Diagnosis not present

## 2022-06-02 DIAGNOSIS — E1129 Type 2 diabetes mellitus with other diabetic kidney complication: Secondary | ICD-10-CM | POA: Diagnosis not present

## 2022-06-05 DIAGNOSIS — E1129 Type 2 diabetes mellitus with other diabetic kidney complication: Secondary | ICD-10-CM | POA: Diagnosis not present

## 2022-06-05 DIAGNOSIS — D631 Anemia in chronic kidney disease: Secondary | ICD-10-CM | POA: Diagnosis not present

## 2022-06-05 DIAGNOSIS — Z23 Encounter for immunization: Secondary | ICD-10-CM | POA: Diagnosis not present

## 2022-06-05 DIAGNOSIS — Z992 Dependence on renal dialysis: Secondary | ICD-10-CM | POA: Diagnosis not present

## 2022-06-05 DIAGNOSIS — N2581 Secondary hyperparathyroidism of renal origin: Secondary | ICD-10-CM | POA: Diagnosis not present

## 2022-06-05 DIAGNOSIS — N186 End stage renal disease: Secondary | ICD-10-CM | POA: Diagnosis not present

## 2022-06-05 DIAGNOSIS — D689 Coagulation defect, unspecified: Secondary | ICD-10-CM | POA: Diagnosis not present

## 2022-06-07 ENCOUNTER — Encounter: Payer: Self-pay | Admitting: Podiatry

## 2022-06-07 ENCOUNTER — Ambulatory Visit (INDEPENDENT_AMBULATORY_CARE_PROVIDER_SITE_OTHER): Payer: Medicare Other | Admitting: Podiatry

## 2022-06-07 DIAGNOSIS — Q828 Other specified congenital malformations of skin: Secondary | ICD-10-CM | POA: Diagnosis not present

## 2022-06-07 DIAGNOSIS — M79676 Pain in unspecified toe(s): Secondary | ICD-10-CM | POA: Diagnosis not present

## 2022-06-07 DIAGNOSIS — E1142 Type 2 diabetes mellitus with diabetic polyneuropathy: Secondary | ICD-10-CM

## 2022-06-07 DIAGNOSIS — D689 Coagulation defect, unspecified: Secondary | ICD-10-CM

## 2022-06-07 DIAGNOSIS — B351 Tinea unguium: Secondary | ICD-10-CM | POA: Diagnosis not present

## 2022-06-07 DIAGNOSIS — N186 End stage renal disease: Secondary | ICD-10-CM | POA: Diagnosis not present

## 2022-06-07 NOTE — Progress Notes (Signed)
This patient returns to my office for at risk foot care.  This patient requires this care by a professional since this patient will be at risk due to having  ESRD, CKD and diabetes.  This patient is unable to cut nails himself since the patient cannot reach his nails.These nails are painful walking and wearing shoes.  This patient presents for at risk foot care today.  General Appearance  Alert, conversant and in no acute stress.  Vascular  Dorsalis pedis and posterior tibial  pulses are palpable  bilaterally.  Capillary return is within normal limits  bilaterally. Temperature is within normal limits  bilaterally.  Neurologic  Senn-Weinstein monofilament wire test diminished   bilaterally. Muscle power within normal limits bilaterally.  Nails Thick disfigured discolored nails with subungual debris  from hallux to fifth toes bilaterally. No evidence of bacterial infection or drainage bilaterally.  Orthopedic  No limitations of motion  feet .  No crepitus or effusions noted.  No bony pathology or digital deformities noted.  Skin  normotropic skin  noted bilaterally.  No signs of infections or ulcers noted.   Porokeratosis sub 4  B/L symptomatic.  Onychomycosis  Pain in right toes  Pain in left toes  Porokeratosis sub 4  B/L.  Consent was obtained for treatment procedures.   Mechanical debridement of nails 1-5  bilaterally performed with a nail nipper.  Filed with dremel without incident.  Debride porokeratosis with # 15 blade and dremel tool.   Return office visit   9 weeks                  Told patient to return for periodic foot care and evaluation due to potential at risk complications.   Gardiner Barefoot DPM

## 2022-06-08 DIAGNOSIS — E1129 Type 2 diabetes mellitus with other diabetic kidney complication: Secondary | ICD-10-CM | POA: Diagnosis not present

## 2022-06-08 DIAGNOSIS — N186 End stage renal disease: Secondary | ICD-10-CM | POA: Diagnosis not present

## 2022-06-08 DIAGNOSIS — Z23 Encounter for immunization: Secondary | ICD-10-CM | POA: Diagnosis not present

## 2022-06-08 DIAGNOSIS — N2581 Secondary hyperparathyroidism of renal origin: Secondary | ICD-10-CM | POA: Diagnosis not present

## 2022-06-08 DIAGNOSIS — D689 Coagulation defect, unspecified: Secondary | ICD-10-CM | POA: Diagnosis not present

## 2022-06-08 DIAGNOSIS — D631 Anemia in chronic kidney disease: Secondary | ICD-10-CM | POA: Diagnosis not present

## 2022-06-08 DIAGNOSIS — Z992 Dependence on renal dialysis: Secondary | ICD-10-CM | POA: Diagnosis not present

## 2022-06-10 DIAGNOSIS — N186 End stage renal disease: Secondary | ICD-10-CM | POA: Diagnosis not present

## 2022-06-10 DIAGNOSIS — D689 Coagulation defect, unspecified: Secondary | ICD-10-CM | POA: Diagnosis not present

## 2022-06-10 DIAGNOSIS — Z992 Dependence on renal dialysis: Secondary | ICD-10-CM | POA: Diagnosis not present

## 2022-06-10 DIAGNOSIS — Z23 Encounter for immunization: Secondary | ICD-10-CM | POA: Diagnosis not present

## 2022-06-10 DIAGNOSIS — E1129 Type 2 diabetes mellitus with other diabetic kidney complication: Secondary | ICD-10-CM | POA: Diagnosis not present

## 2022-06-10 DIAGNOSIS — E1122 Type 2 diabetes mellitus with diabetic chronic kidney disease: Secondary | ICD-10-CM | POA: Diagnosis not present

## 2022-06-10 DIAGNOSIS — N2581 Secondary hyperparathyroidism of renal origin: Secondary | ICD-10-CM | POA: Diagnosis not present

## 2022-06-10 DIAGNOSIS — D631 Anemia in chronic kidney disease: Secondary | ICD-10-CM | POA: Diagnosis not present

## 2022-06-12 DIAGNOSIS — N2581 Secondary hyperparathyroidism of renal origin: Secondary | ICD-10-CM | POA: Diagnosis not present

## 2022-06-12 DIAGNOSIS — N186 End stage renal disease: Secondary | ICD-10-CM | POA: Diagnosis not present

## 2022-06-12 DIAGNOSIS — Z992 Dependence on renal dialysis: Secondary | ICD-10-CM | POA: Diagnosis not present

## 2022-06-12 DIAGNOSIS — D689 Coagulation defect, unspecified: Secondary | ICD-10-CM | POA: Diagnosis not present

## 2022-06-12 DIAGNOSIS — E1129 Type 2 diabetes mellitus with other diabetic kidney complication: Secondary | ICD-10-CM | POA: Diagnosis not present

## 2022-06-15 DIAGNOSIS — Z992 Dependence on renal dialysis: Secondary | ICD-10-CM | POA: Diagnosis not present

## 2022-06-15 DIAGNOSIS — N186 End stage renal disease: Secondary | ICD-10-CM | POA: Diagnosis not present

## 2022-06-15 DIAGNOSIS — D689 Coagulation defect, unspecified: Secondary | ICD-10-CM | POA: Diagnosis not present

## 2022-06-15 DIAGNOSIS — N2581 Secondary hyperparathyroidism of renal origin: Secondary | ICD-10-CM | POA: Diagnosis not present

## 2022-06-15 DIAGNOSIS — E1129 Type 2 diabetes mellitus with other diabetic kidney complication: Secondary | ICD-10-CM | POA: Diagnosis not present

## 2022-06-17 DIAGNOSIS — N186 End stage renal disease: Secondary | ICD-10-CM | POA: Diagnosis not present

## 2022-06-17 DIAGNOSIS — Z992 Dependence on renal dialysis: Secondary | ICD-10-CM | POA: Diagnosis not present

## 2022-06-17 DIAGNOSIS — N2581 Secondary hyperparathyroidism of renal origin: Secondary | ICD-10-CM | POA: Diagnosis not present

## 2022-06-17 DIAGNOSIS — D689 Coagulation defect, unspecified: Secondary | ICD-10-CM | POA: Diagnosis not present

## 2022-06-17 DIAGNOSIS — E1129 Type 2 diabetes mellitus with other diabetic kidney complication: Secondary | ICD-10-CM | POA: Diagnosis not present

## 2022-06-19 DIAGNOSIS — Z992 Dependence on renal dialysis: Secondary | ICD-10-CM | POA: Diagnosis not present

## 2022-06-19 DIAGNOSIS — N2581 Secondary hyperparathyroidism of renal origin: Secondary | ICD-10-CM | POA: Diagnosis not present

## 2022-06-19 DIAGNOSIS — E1129 Type 2 diabetes mellitus with other diabetic kidney complication: Secondary | ICD-10-CM | POA: Diagnosis not present

## 2022-06-19 DIAGNOSIS — N186 End stage renal disease: Secondary | ICD-10-CM | POA: Diagnosis not present

## 2022-06-19 DIAGNOSIS — D689 Coagulation defect, unspecified: Secondary | ICD-10-CM | POA: Diagnosis not present

## 2022-06-22 DIAGNOSIS — Z992 Dependence on renal dialysis: Secondary | ICD-10-CM | POA: Diagnosis not present

## 2022-06-22 DIAGNOSIS — N2581 Secondary hyperparathyroidism of renal origin: Secondary | ICD-10-CM | POA: Diagnosis not present

## 2022-06-22 DIAGNOSIS — E1129 Type 2 diabetes mellitus with other diabetic kidney complication: Secondary | ICD-10-CM | POA: Diagnosis not present

## 2022-06-22 DIAGNOSIS — D689 Coagulation defect, unspecified: Secondary | ICD-10-CM | POA: Diagnosis not present

## 2022-06-22 DIAGNOSIS — N186 End stage renal disease: Secondary | ICD-10-CM | POA: Diagnosis not present

## 2022-06-24 DIAGNOSIS — E1129 Type 2 diabetes mellitus with other diabetic kidney complication: Secondary | ICD-10-CM | POA: Diagnosis not present

## 2022-06-24 DIAGNOSIS — N186 End stage renal disease: Secondary | ICD-10-CM | POA: Diagnosis not present

## 2022-06-24 DIAGNOSIS — D689 Coagulation defect, unspecified: Secondary | ICD-10-CM | POA: Diagnosis not present

## 2022-06-24 DIAGNOSIS — Z992 Dependence on renal dialysis: Secondary | ICD-10-CM | POA: Diagnosis not present

## 2022-06-24 DIAGNOSIS — N2581 Secondary hyperparathyroidism of renal origin: Secondary | ICD-10-CM | POA: Diagnosis not present

## 2022-06-26 DIAGNOSIS — E1129 Type 2 diabetes mellitus with other diabetic kidney complication: Secondary | ICD-10-CM | POA: Diagnosis not present

## 2022-06-26 DIAGNOSIS — N186 End stage renal disease: Secondary | ICD-10-CM | POA: Diagnosis not present

## 2022-06-26 DIAGNOSIS — Z992 Dependence on renal dialysis: Secondary | ICD-10-CM | POA: Diagnosis not present

## 2022-06-26 DIAGNOSIS — N2581 Secondary hyperparathyroidism of renal origin: Secondary | ICD-10-CM | POA: Diagnosis not present

## 2022-06-26 DIAGNOSIS — D689 Coagulation defect, unspecified: Secondary | ICD-10-CM | POA: Diagnosis not present

## 2022-06-29 DIAGNOSIS — Z992 Dependence on renal dialysis: Secondary | ICD-10-CM | POA: Diagnosis not present

## 2022-06-29 DIAGNOSIS — N2581 Secondary hyperparathyroidism of renal origin: Secondary | ICD-10-CM | POA: Diagnosis not present

## 2022-06-29 DIAGNOSIS — E1129 Type 2 diabetes mellitus with other diabetic kidney complication: Secondary | ICD-10-CM | POA: Diagnosis not present

## 2022-06-29 DIAGNOSIS — N186 End stage renal disease: Secondary | ICD-10-CM | POA: Diagnosis not present

## 2022-06-29 DIAGNOSIS — D689 Coagulation defect, unspecified: Secondary | ICD-10-CM | POA: Diagnosis not present

## 2022-07-01 DIAGNOSIS — E1129 Type 2 diabetes mellitus with other diabetic kidney complication: Secondary | ICD-10-CM | POA: Diagnosis not present

## 2022-07-01 DIAGNOSIS — D689 Coagulation defect, unspecified: Secondary | ICD-10-CM | POA: Diagnosis not present

## 2022-07-01 DIAGNOSIS — N2581 Secondary hyperparathyroidism of renal origin: Secondary | ICD-10-CM | POA: Diagnosis not present

## 2022-07-01 DIAGNOSIS — N186 End stage renal disease: Secondary | ICD-10-CM | POA: Diagnosis not present

## 2022-07-01 DIAGNOSIS — Z992 Dependence on renal dialysis: Secondary | ICD-10-CM | POA: Diagnosis not present

## 2022-07-03 DIAGNOSIS — Z992 Dependence on renal dialysis: Secondary | ICD-10-CM | POA: Diagnosis not present

## 2022-07-03 DIAGNOSIS — D689 Coagulation defect, unspecified: Secondary | ICD-10-CM | POA: Diagnosis not present

## 2022-07-03 DIAGNOSIS — N186 End stage renal disease: Secondary | ICD-10-CM | POA: Diagnosis not present

## 2022-07-03 DIAGNOSIS — N2581 Secondary hyperparathyroidism of renal origin: Secondary | ICD-10-CM | POA: Diagnosis not present

## 2022-07-03 DIAGNOSIS — E1129 Type 2 diabetes mellitus with other diabetic kidney complication: Secondary | ICD-10-CM | POA: Diagnosis not present

## 2022-07-06 DIAGNOSIS — D689 Coagulation defect, unspecified: Secondary | ICD-10-CM | POA: Diagnosis not present

## 2022-07-06 DIAGNOSIS — N186 End stage renal disease: Secondary | ICD-10-CM | POA: Diagnosis not present

## 2022-07-06 DIAGNOSIS — Z992 Dependence on renal dialysis: Secondary | ICD-10-CM | POA: Diagnosis not present

## 2022-07-06 DIAGNOSIS — E1129 Type 2 diabetes mellitus with other diabetic kidney complication: Secondary | ICD-10-CM | POA: Diagnosis not present

## 2022-07-06 DIAGNOSIS — N2581 Secondary hyperparathyroidism of renal origin: Secondary | ICD-10-CM | POA: Diagnosis not present

## 2022-07-08 ENCOUNTER — Telehealth: Payer: Self-pay | Admitting: Internal Medicine

## 2022-07-08 DIAGNOSIS — E1129 Type 2 diabetes mellitus with other diabetic kidney complication: Secondary | ICD-10-CM | POA: Diagnosis not present

## 2022-07-08 DIAGNOSIS — N186 End stage renal disease: Secondary | ICD-10-CM | POA: Diagnosis not present

## 2022-07-08 DIAGNOSIS — Z992 Dependence on renal dialysis: Secondary | ICD-10-CM | POA: Diagnosis not present

## 2022-07-08 DIAGNOSIS — N2581 Secondary hyperparathyroidism of renal origin: Secondary | ICD-10-CM | POA: Diagnosis not present

## 2022-07-08 DIAGNOSIS — D689 Coagulation defect, unspecified: Secondary | ICD-10-CM | POA: Diagnosis not present

## 2022-07-10 DIAGNOSIS — N2581 Secondary hyperparathyroidism of renal origin: Secondary | ICD-10-CM | POA: Diagnosis not present

## 2022-07-10 DIAGNOSIS — E1129 Type 2 diabetes mellitus with other diabetic kidney complication: Secondary | ICD-10-CM | POA: Diagnosis not present

## 2022-07-10 DIAGNOSIS — N186 End stage renal disease: Secondary | ICD-10-CM | POA: Diagnosis not present

## 2022-07-10 DIAGNOSIS — Z992 Dependence on renal dialysis: Secondary | ICD-10-CM | POA: Diagnosis not present

## 2022-07-10 DIAGNOSIS — D689 Coagulation defect, unspecified: Secondary | ICD-10-CM | POA: Diagnosis not present

## 2022-07-11 DIAGNOSIS — Z992 Dependence on renal dialysis: Secondary | ICD-10-CM | POA: Diagnosis not present

## 2022-07-11 DIAGNOSIS — E1122 Type 2 diabetes mellitus with diabetic chronic kidney disease: Secondary | ICD-10-CM | POA: Diagnosis not present

## 2022-07-11 DIAGNOSIS — N186 End stage renal disease: Secondary | ICD-10-CM | POA: Diagnosis not present

## 2022-07-13 DIAGNOSIS — N2581 Secondary hyperparathyroidism of renal origin: Secondary | ICD-10-CM | POA: Diagnosis not present

## 2022-07-13 DIAGNOSIS — D631 Anemia in chronic kidney disease: Secondary | ICD-10-CM | POA: Diagnosis not present

## 2022-07-13 DIAGNOSIS — N186 End stage renal disease: Secondary | ICD-10-CM | POA: Diagnosis not present

## 2022-07-13 DIAGNOSIS — D689 Coagulation defect, unspecified: Secondary | ICD-10-CM | POA: Diagnosis not present

## 2022-07-13 DIAGNOSIS — E1129 Type 2 diabetes mellitus with other diabetic kidney complication: Secondary | ICD-10-CM | POA: Diagnosis not present

## 2022-07-13 DIAGNOSIS — Z992 Dependence on renal dialysis: Secondary | ICD-10-CM | POA: Diagnosis not present

## 2022-07-15 DIAGNOSIS — D631 Anemia in chronic kidney disease: Secondary | ICD-10-CM | POA: Diagnosis not present

## 2022-07-15 DIAGNOSIS — N2581 Secondary hyperparathyroidism of renal origin: Secondary | ICD-10-CM | POA: Diagnosis not present

## 2022-07-15 DIAGNOSIS — Z992 Dependence on renal dialysis: Secondary | ICD-10-CM | POA: Diagnosis not present

## 2022-07-15 DIAGNOSIS — E1129 Type 2 diabetes mellitus with other diabetic kidney complication: Secondary | ICD-10-CM | POA: Diagnosis not present

## 2022-07-15 DIAGNOSIS — N186 End stage renal disease: Secondary | ICD-10-CM | POA: Diagnosis not present

## 2022-07-15 DIAGNOSIS — D689 Coagulation defect, unspecified: Secondary | ICD-10-CM | POA: Diagnosis not present

## 2022-07-17 DIAGNOSIS — E1129 Type 2 diabetes mellitus with other diabetic kidney complication: Secondary | ICD-10-CM | POA: Diagnosis not present

## 2022-07-17 DIAGNOSIS — D631 Anemia in chronic kidney disease: Secondary | ICD-10-CM | POA: Diagnosis not present

## 2022-07-17 DIAGNOSIS — N186 End stage renal disease: Secondary | ICD-10-CM | POA: Diagnosis not present

## 2022-07-17 DIAGNOSIS — Z992 Dependence on renal dialysis: Secondary | ICD-10-CM | POA: Diagnosis not present

## 2022-07-17 DIAGNOSIS — N2581 Secondary hyperparathyroidism of renal origin: Secondary | ICD-10-CM | POA: Diagnosis not present

## 2022-07-17 DIAGNOSIS — D689 Coagulation defect, unspecified: Secondary | ICD-10-CM | POA: Diagnosis not present

## 2022-07-19 ENCOUNTER — Telehealth: Payer: Self-pay

## 2022-07-19 DIAGNOSIS — E1129 Type 2 diabetes mellitus with other diabetic kidney complication: Secondary | ICD-10-CM

## 2022-07-19 NOTE — Telephone Encounter (Signed)
Pts pharmacy has stated they are on national back order for Dulaglutide (TRULICITY) 1.5 ZH/2.9JM SOPN. They put order on hold and stated you can send the rx to an other pharmacy or send in an alternative medication.

## 2022-07-19 NOTE — Telephone Encounter (Signed)
Ok to contact pt - does he have a different pharmacy to try sending the script?  thanks

## 2022-07-20 DIAGNOSIS — D689 Coagulation defect, unspecified: Secondary | ICD-10-CM | POA: Diagnosis not present

## 2022-07-20 DIAGNOSIS — N2581 Secondary hyperparathyroidism of renal origin: Secondary | ICD-10-CM | POA: Diagnosis not present

## 2022-07-20 DIAGNOSIS — E1129 Type 2 diabetes mellitus with other diabetic kidney complication: Secondary | ICD-10-CM | POA: Diagnosis not present

## 2022-07-20 DIAGNOSIS — N186 End stage renal disease: Secondary | ICD-10-CM | POA: Diagnosis not present

## 2022-07-20 DIAGNOSIS — Z992 Dependence on renal dialysis: Secondary | ICD-10-CM | POA: Diagnosis not present

## 2022-07-20 DIAGNOSIS — D631 Anemia in chronic kidney disease: Secondary | ICD-10-CM | POA: Diagnosis not present

## 2022-07-21 MED ORDER — TRULICITY 1.5 MG/0.5ML ~~LOC~~ SOAJ: SUBCUTANEOUS | 0 refills | Status: DC

## 2022-07-21 NOTE — Telephone Encounter (Signed)
Spoke with pts mother and she has stated to send the medication to the [facility] on file. I have sent this in.

## 2022-07-21 NOTE — Addendum Note (Signed)
Addended by: Marijean Heath R on: 07/21/2022 09:49 AM   Modules accepted: Orders

## 2022-07-22 DIAGNOSIS — E1129 Type 2 diabetes mellitus with other diabetic kidney complication: Secondary | ICD-10-CM | POA: Diagnosis not present

## 2022-07-22 DIAGNOSIS — D631 Anemia in chronic kidney disease: Secondary | ICD-10-CM | POA: Diagnosis not present

## 2022-07-22 DIAGNOSIS — N186 End stage renal disease: Secondary | ICD-10-CM | POA: Diagnosis not present

## 2022-07-22 DIAGNOSIS — N2581 Secondary hyperparathyroidism of renal origin: Secondary | ICD-10-CM | POA: Diagnosis not present

## 2022-07-22 DIAGNOSIS — Z992 Dependence on renal dialysis: Secondary | ICD-10-CM | POA: Diagnosis not present

## 2022-07-22 DIAGNOSIS — D689 Coagulation defect, unspecified: Secondary | ICD-10-CM | POA: Diagnosis not present

## 2022-07-24 DIAGNOSIS — Z992 Dependence on renal dialysis: Secondary | ICD-10-CM | POA: Diagnosis not present

## 2022-07-24 DIAGNOSIS — D631 Anemia in chronic kidney disease: Secondary | ICD-10-CM | POA: Diagnosis not present

## 2022-07-24 DIAGNOSIS — D689 Coagulation defect, unspecified: Secondary | ICD-10-CM | POA: Diagnosis not present

## 2022-07-24 DIAGNOSIS — N2581 Secondary hyperparathyroidism of renal origin: Secondary | ICD-10-CM | POA: Diagnosis not present

## 2022-07-24 DIAGNOSIS — E1129 Type 2 diabetes mellitus with other diabetic kidney complication: Secondary | ICD-10-CM | POA: Diagnosis not present

## 2022-07-24 DIAGNOSIS — N186 End stage renal disease: Secondary | ICD-10-CM | POA: Diagnosis not present

## 2022-07-27 DIAGNOSIS — D631 Anemia in chronic kidney disease: Secondary | ICD-10-CM | POA: Diagnosis not present

## 2022-07-27 DIAGNOSIS — Z992 Dependence on renal dialysis: Secondary | ICD-10-CM | POA: Diagnosis not present

## 2022-07-27 DIAGNOSIS — N2581 Secondary hyperparathyroidism of renal origin: Secondary | ICD-10-CM | POA: Diagnosis not present

## 2022-07-27 DIAGNOSIS — N186 End stage renal disease: Secondary | ICD-10-CM | POA: Diagnosis not present

## 2022-07-27 DIAGNOSIS — D689 Coagulation defect, unspecified: Secondary | ICD-10-CM | POA: Diagnosis not present

## 2022-07-27 DIAGNOSIS — E1129 Type 2 diabetes mellitus with other diabetic kidney complication: Secondary | ICD-10-CM | POA: Diagnosis not present

## 2022-07-29 DIAGNOSIS — N2581 Secondary hyperparathyroidism of renal origin: Secondary | ICD-10-CM | POA: Diagnosis not present

## 2022-07-29 DIAGNOSIS — E1129 Type 2 diabetes mellitus with other diabetic kidney complication: Secondary | ICD-10-CM | POA: Diagnosis not present

## 2022-07-29 DIAGNOSIS — N186 End stage renal disease: Secondary | ICD-10-CM | POA: Diagnosis not present

## 2022-07-29 DIAGNOSIS — D631 Anemia in chronic kidney disease: Secondary | ICD-10-CM | POA: Diagnosis not present

## 2022-07-29 DIAGNOSIS — D689 Coagulation defect, unspecified: Secondary | ICD-10-CM | POA: Diagnosis not present

## 2022-07-29 DIAGNOSIS — Z992 Dependence on renal dialysis: Secondary | ICD-10-CM | POA: Diagnosis not present

## 2022-07-31 DIAGNOSIS — D689 Coagulation defect, unspecified: Secondary | ICD-10-CM | POA: Diagnosis not present

## 2022-07-31 DIAGNOSIS — D631 Anemia in chronic kidney disease: Secondary | ICD-10-CM | POA: Diagnosis not present

## 2022-07-31 DIAGNOSIS — N186 End stage renal disease: Secondary | ICD-10-CM | POA: Diagnosis not present

## 2022-07-31 DIAGNOSIS — Z992 Dependence on renal dialysis: Secondary | ICD-10-CM | POA: Diagnosis not present

## 2022-07-31 DIAGNOSIS — E1129 Type 2 diabetes mellitus with other diabetic kidney complication: Secondary | ICD-10-CM | POA: Diagnosis not present

## 2022-07-31 DIAGNOSIS — N2581 Secondary hyperparathyroidism of renal origin: Secondary | ICD-10-CM | POA: Diagnosis not present

## 2022-08-03 ENCOUNTER — Telehealth: Payer: Self-pay | Admitting: Internal Medicine

## 2022-08-03 DIAGNOSIS — Z992 Dependence on renal dialysis: Secondary | ICD-10-CM | POA: Diagnosis not present

## 2022-08-03 DIAGNOSIS — N2581 Secondary hyperparathyroidism of renal origin: Secondary | ICD-10-CM | POA: Diagnosis not present

## 2022-08-03 DIAGNOSIS — D631 Anemia in chronic kidney disease: Secondary | ICD-10-CM | POA: Diagnosis not present

## 2022-08-03 DIAGNOSIS — E1129 Type 2 diabetes mellitus with other diabetic kidney complication: Secondary | ICD-10-CM | POA: Diagnosis not present

## 2022-08-03 DIAGNOSIS — N186 End stage renal disease: Secondary | ICD-10-CM | POA: Diagnosis not present

## 2022-08-03 DIAGNOSIS — D689 Coagulation defect, unspecified: Secondary | ICD-10-CM | POA: Diagnosis not present

## 2022-08-03 NOTE — Telephone Encounter (Signed)
Caller & Relationship to patient:  [place]   Call back number: [phone]   Date of last office visit:   Date of next office visit:      Medication(s) to be refilled:  Actos 15 mg. - generic        Preferred Pharmacy:

## 2022-08-03 NOTE — Telephone Encounter (Signed)
Patient requested refill too soon

## 2022-08-05 DIAGNOSIS — D689 Coagulation defect, unspecified: Secondary | ICD-10-CM | POA: Diagnosis not present

## 2022-08-05 DIAGNOSIS — N2581 Secondary hyperparathyroidism of renal origin: Secondary | ICD-10-CM | POA: Diagnosis not present

## 2022-08-05 DIAGNOSIS — E1129 Type 2 diabetes mellitus with other diabetic kidney complication: Secondary | ICD-10-CM | POA: Diagnosis not present

## 2022-08-05 DIAGNOSIS — N186 End stage renal disease: Secondary | ICD-10-CM | POA: Diagnosis not present

## 2022-08-05 DIAGNOSIS — Z992 Dependence on renal dialysis: Secondary | ICD-10-CM | POA: Diagnosis not present

## 2022-08-05 DIAGNOSIS — D631 Anemia in chronic kidney disease: Secondary | ICD-10-CM | POA: Diagnosis not present

## 2022-08-06 ENCOUNTER — Other Ambulatory Visit: Payer: Self-pay | Admitting: Internal Medicine

## 2022-08-06 NOTE — Telephone Encounter (Signed)
[facility] called back to refill medication. They said they'd like to get a jump on it since he has no more refills.

## 2022-08-07 DIAGNOSIS — D631 Anemia in chronic kidney disease: Secondary | ICD-10-CM | POA: Diagnosis not present

## 2022-08-07 DIAGNOSIS — Z992 Dependence on renal dialysis: Secondary | ICD-10-CM | POA: Diagnosis not present

## 2022-08-07 DIAGNOSIS — D689 Coagulation defect, unspecified: Secondary | ICD-10-CM | POA: Diagnosis not present

## 2022-08-07 DIAGNOSIS — N186 End stage renal disease: Secondary | ICD-10-CM | POA: Diagnosis not present

## 2022-08-07 DIAGNOSIS — E1129 Type 2 diabetes mellitus with other diabetic kidney complication: Secondary | ICD-10-CM | POA: Diagnosis not present

## 2022-08-07 DIAGNOSIS — N2581 Secondary hyperparathyroidism of renal origin: Secondary | ICD-10-CM | POA: Diagnosis not present

## 2022-08-07 NOTE — Telephone Encounter (Signed)
Please refill as per office routine med refill policy (all routine meds to be refilled for 3 mo or monthly (per pt preference) up to one year from last visit, then month to month grace period for 3 mo, then further med refills will have to be denied)

## 2022-08-09 ENCOUNTER — Ambulatory Visit (INDEPENDENT_AMBULATORY_CARE_PROVIDER_SITE_OTHER): Payer: Medicare Other | Admitting: Podiatry

## 2022-08-09 ENCOUNTER — Encounter: Payer: Self-pay | Admitting: Podiatry

## 2022-08-09 DIAGNOSIS — N186 End stage renal disease: Secondary | ICD-10-CM

## 2022-08-09 DIAGNOSIS — B351 Tinea unguium: Secondary | ICD-10-CM | POA: Diagnosis not present

## 2022-08-09 DIAGNOSIS — E1142 Type 2 diabetes mellitus with diabetic polyneuropathy: Secondary | ICD-10-CM

## 2022-08-09 DIAGNOSIS — M79676 Pain in unspecified toe(s): Secondary | ICD-10-CM

## 2022-08-09 DIAGNOSIS — Q828 Other specified congenital malformations of skin: Secondary | ICD-10-CM | POA: Diagnosis not present

## 2022-08-09 NOTE — Progress Notes (Signed)
This patient returns to my office for at risk foot care.  This patient requires this care by a professional since this patient will be at risk due to having  ESRD, CKD and diabetes.  This patient is unable to cut nails himself since the patient cannot reach his nails.These nails are painful walking and wearing shoes.  This patient presents for at risk foot care today.  General Appearance  Alert, conversant and in no acute stress.  Vascular  Dorsalis pedis and posterior tibial  pulses are palpable  bilaterally.  Capillary return is within normal limits  bilaterally. Temperature is within normal limits  bilaterally.  Neurologic  Senn-Weinstein monofilament wire test diminished   bilaterally. Muscle power within normal limits bilaterally.  Nails Thick disfigured discolored nails with subungual debris  from hallux to fifth toes bilaterally. No evidence of bacterial infection or drainage bilaterally.  Orthopedic  No limitations of motion  feet .  No crepitus or effusions noted.  No bony pathology or digital deformities noted.  Skin  normotropic skin  noted bilaterally.  No signs of infections or ulcers noted.   Porokeratosis sub 4  left  symptomatic.  Onychomycosis  Pain in right toes  Pain in left toes  Porokeratosis sub 4 left foot.  Consent was obtained for treatment procedures.   Mechanical debridement of nails 1-5  bilaterally performed with a nail nipper.  Filed with dremel without incident.  Debride porokeratosis with # 15 blade and dremel tool.   Return office visit   9 weeks                  Told patient to return for periodic foot care and evaluation due to potential at risk complications.   Gardiner Barefoot DPM

## 2022-08-09 NOTE — Telephone Encounter (Signed)
Done erx 

## 2022-08-10 ENCOUNTER — Encounter: Payer: Self-pay | Admitting: Podiatry

## 2022-08-10 DIAGNOSIS — E1129 Type 2 diabetes mellitus with other diabetic kidney complication: Secondary | ICD-10-CM | POA: Diagnosis not present

## 2022-08-10 DIAGNOSIS — N2581 Secondary hyperparathyroidism of renal origin: Secondary | ICD-10-CM | POA: Diagnosis not present

## 2022-08-10 DIAGNOSIS — Z992 Dependence on renal dialysis: Secondary | ICD-10-CM | POA: Diagnosis not present

## 2022-08-10 DIAGNOSIS — D689 Coagulation defect, unspecified: Secondary | ICD-10-CM | POA: Diagnosis not present

## 2022-08-10 DIAGNOSIS — D631 Anemia in chronic kidney disease: Secondary | ICD-10-CM | POA: Diagnosis not present

## 2022-08-10 DIAGNOSIS — N186 End stage renal disease: Secondary | ICD-10-CM | POA: Diagnosis not present

## 2022-08-11 DIAGNOSIS — N186 End stage renal disease: Secondary | ICD-10-CM | POA: Diagnosis not present

## 2022-08-11 DIAGNOSIS — Z992 Dependence on renal dialysis: Secondary | ICD-10-CM | POA: Diagnosis not present

## 2022-08-11 DIAGNOSIS — E1122 Type 2 diabetes mellitus with diabetic chronic kidney disease: Secondary | ICD-10-CM | POA: Diagnosis not present

## 2022-08-12 ENCOUNTER — Encounter: Payer: Self-pay | Admitting: Podiatry

## 2022-08-12 DIAGNOSIS — N186 End stage renal disease: Secondary | ICD-10-CM | POA: Diagnosis not present

## 2022-08-12 DIAGNOSIS — E1129 Type 2 diabetes mellitus with other diabetic kidney complication: Secondary | ICD-10-CM | POA: Diagnosis not present

## 2022-08-12 DIAGNOSIS — D631 Anemia in chronic kidney disease: Secondary | ICD-10-CM | POA: Diagnosis not present

## 2022-08-12 DIAGNOSIS — N2581 Secondary hyperparathyroidism of renal origin: Secondary | ICD-10-CM | POA: Diagnosis not present

## 2022-08-12 DIAGNOSIS — Z992 Dependence on renal dialysis: Secondary | ICD-10-CM | POA: Diagnosis not present

## 2022-08-12 DIAGNOSIS — D689 Coagulation defect, unspecified: Secondary | ICD-10-CM | POA: Diagnosis not present

## 2022-08-14 DIAGNOSIS — D631 Anemia in chronic kidney disease: Secondary | ICD-10-CM | POA: Diagnosis not present

## 2022-08-14 DIAGNOSIS — N2581 Secondary hyperparathyroidism of renal origin: Secondary | ICD-10-CM | POA: Diagnosis not present

## 2022-08-14 DIAGNOSIS — E1129 Type 2 diabetes mellitus with other diabetic kidney complication: Secondary | ICD-10-CM | POA: Diagnosis not present

## 2022-08-14 DIAGNOSIS — N186 End stage renal disease: Secondary | ICD-10-CM | POA: Diagnosis not present

## 2022-08-14 DIAGNOSIS — D689 Coagulation defect, unspecified: Secondary | ICD-10-CM | POA: Diagnosis not present

## 2022-08-14 DIAGNOSIS — Z992 Dependence on renal dialysis: Secondary | ICD-10-CM | POA: Diagnosis not present

## 2022-08-17 DIAGNOSIS — Z992 Dependence on renal dialysis: Secondary | ICD-10-CM | POA: Diagnosis not present

## 2022-08-17 DIAGNOSIS — N186 End stage renal disease: Secondary | ICD-10-CM | POA: Diagnosis not present

## 2022-08-17 DIAGNOSIS — D689 Coagulation defect, unspecified: Secondary | ICD-10-CM | POA: Diagnosis not present

## 2022-08-17 DIAGNOSIS — N2581 Secondary hyperparathyroidism of renal origin: Secondary | ICD-10-CM | POA: Diagnosis not present

## 2022-08-17 DIAGNOSIS — E1129 Type 2 diabetes mellitus with other diabetic kidney complication: Secondary | ICD-10-CM | POA: Diagnosis not present

## 2022-08-17 DIAGNOSIS — D631 Anemia in chronic kidney disease: Secondary | ICD-10-CM | POA: Diagnosis not present

## 2022-08-19 DIAGNOSIS — D631 Anemia in chronic kidney disease: Secondary | ICD-10-CM | POA: Diagnosis not present

## 2022-08-19 DIAGNOSIS — N2581 Secondary hyperparathyroidism of renal origin: Secondary | ICD-10-CM | POA: Diagnosis not present

## 2022-08-19 DIAGNOSIS — D689 Coagulation defect, unspecified: Secondary | ICD-10-CM | POA: Diagnosis not present

## 2022-08-19 DIAGNOSIS — N186 End stage renal disease: Secondary | ICD-10-CM | POA: Diagnosis not present

## 2022-08-19 DIAGNOSIS — E1129 Type 2 diabetes mellitus with other diabetic kidney complication: Secondary | ICD-10-CM | POA: Diagnosis not present

## 2022-08-19 DIAGNOSIS — Z992 Dependence on renal dialysis: Secondary | ICD-10-CM | POA: Diagnosis not present

## 2022-08-21 DIAGNOSIS — N2581 Secondary hyperparathyroidism of renal origin: Secondary | ICD-10-CM | POA: Diagnosis not present

## 2022-08-21 DIAGNOSIS — D631 Anemia in chronic kidney disease: Secondary | ICD-10-CM | POA: Diagnosis not present

## 2022-08-21 DIAGNOSIS — N186 End stage renal disease: Secondary | ICD-10-CM | POA: Diagnosis not present

## 2022-08-21 DIAGNOSIS — D689 Coagulation defect, unspecified: Secondary | ICD-10-CM | POA: Diagnosis not present

## 2022-08-21 DIAGNOSIS — E1129 Type 2 diabetes mellitus with other diabetic kidney complication: Secondary | ICD-10-CM | POA: Diagnosis not present

## 2022-08-21 DIAGNOSIS — Z992 Dependence on renal dialysis: Secondary | ICD-10-CM | POA: Diagnosis not present

## 2022-08-23 ENCOUNTER — Telehealth: Payer: Self-pay | Admitting: Internal Medicine

## 2022-08-23 NOTE — Telephone Encounter (Signed)
Pharmacy Tech called from: [facility] - [place], [place] - [address]  letting the Doctor know Rx Dulaglutide (TRULICITY) 1.5 0000000 is on a manufacturer back hold.

## 2022-08-24 DIAGNOSIS — D631 Anemia in chronic kidney disease: Secondary | ICD-10-CM | POA: Diagnosis not present

## 2022-08-24 DIAGNOSIS — N2581 Secondary hyperparathyroidism of renal origin: Secondary | ICD-10-CM | POA: Diagnosis not present

## 2022-08-24 DIAGNOSIS — N186 End stage renal disease: Secondary | ICD-10-CM | POA: Diagnosis not present

## 2022-08-24 DIAGNOSIS — Z992 Dependence on renal dialysis: Secondary | ICD-10-CM | POA: Diagnosis not present

## 2022-08-24 DIAGNOSIS — E1129 Type 2 diabetes mellitus with other diabetic kidney complication: Secondary | ICD-10-CM | POA: Diagnosis not present

## 2022-08-24 DIAGNOSIS — D689 Coagulation defect, unspecified: Secondary | ICD-10-CM | POA: Diagnosis not present

## 2022-08-26 DIAGNOSIS — N2581 Secondary hyperparathyroidism of renal origin: Secondary | ICD-10-CM | POA: Diagnosis not present

## 2022-08-26 DIAGNOSIS — D631 Anemia in chronic kidney disease: Secondary | ICD-10-CM | POA: Diagnosis not present

## 2022-08-26 DIAGNOSIS — D689 Coagulation defect, unspecified: Secondary | ICD-10-CM | POA: Diagnosis not present

## 2022-08-26 DIAGNOSIS — Z992 Dependence on renal dialysis: Secondary | ICD-10-CM | POA: Diagnosis not present

## 2022-08-26 DIAGNOSIS — E1129 Type 2 diabetes mellitus with other diabetic kidney complication: Secondary | ICD-10-CM | POA: Diagnosis not present

## 2022-08-26 DIAGNOSIS — N186 End stage renal disease: Secondary | ICD-10-CM | POA: Diagnosis not present

## 2022-08-28 DIAGNOSIS — D689 Coagulation defect, unspecified: Secondary | ICD-10-CM | POA: Diagnosis not present

## 2022-08-28 DIAGNOSIS — N186 End stage renal disease: Secondary | ICD-10-CM | POA: Diagnosis not present

## 2022-08-28 DIAGNOSIS — E1129 Type 2 diabetes mellitus with other diabetic kidney complication: Secondary | ICD-10-CM | POA: Diagnosis not present

## 2022-08-28 DIAGNOSIS — N2581 Secondary hyperparathyroidism of renal origin: Secondary | ICD-10-CM | POA: Diagnosis not present

## 2022-08-28 DIAGNOSIS — Z992 Dependence on renal dialysis: Secondary | ICD-10-CM | POA: Diagnosis not present

## 2022-08-28 DIAGNOSIS — D631 Anemia in chronic kidney disease: Secondary | ICD-10-CM | POA: Diagnosis not present

## 2022-08-31 DIAGNOSIS — D689 Coagulation defect, unspecified: Secondary | ICD-10-CM | POA: Diagnosis not present

## 2022-08-31 DIAGNOSIS — Z992 Dependence on renal dialysis: Secondary | ICD-10-CM | POA: Diagnosis not present

## 2022-08-31 DIAGNOSIS — N2581 Secondary hyperparathyroidism of renal origin: Secondary | ICD-10-CM | POA: Diagnosis not present

## 2022-08-31 DIAGNOSIS — N186 End stage renal disease: Secondary | ICD-10-CM | POA: Diagnosis not present

## 2022-08-31 DIAGNOSIS — D631 Anemia in chronic kidney disease: Secondary | ICD-10-CM | POA: Diagnosis not present

## 2022-08-31 DIAGNOSIS — E1129 Type 2 diabetes mellitus with other diabetic kidney complication: Secondary | ICD-10-CM | POA: Diagnosis not present

## 2022-09-02 DIAGNOSIS — D631 Anemia in chronic kidney disease: Secondary | ICD-10-CM | POA: Diagnosis not present

## 2022-09-02 DIAGNOSIS — N2581 Secondary hyperparathyroidism of renal origin: Secondary | ICD-10-CM | POA: Diagnosis not present

## 2022-09-02 DIAGNOSIS — Z992 Dependence on renal dialysis: Secondary | ICD-10-CM | POA: Diagnosis not present

## 2022-09-02 DIAGNOSIS — E1129 Type 2 diabetes mellitus with other diabetic kidney complication: Secondary | ICD-10-CM | POA: Diagnosis not present

## 2022-09-02 DIAGNOSIS — D689 Coagulation defect, unspecified: Secondary | ICD-10-CM | POA: Diagnosis not present

## 2022-09-02 DIAGNOSIS — N186 End stage renal disease: Secondary | ICD-10-CM | POA: Diagnosis not present

## 2022-09-04 DIAGNOSIS — N2581 Secondary hyperparathyroidism of renal origin: Secondary | ICD-10-CM | POA: Diagnosis not present

## 2022-09-04 DIAGNOSIS — N186 End stage renal disease: Secondary | ICD-10-CM | POA: Diagnosis not present

## 2022-09-04 DIAGNOSIS — Z992 Dependence on renal dialysis: Secondary | ICD-10-CM | POA: Diagnosis not present

## 2022-09-04 DIAGNOSIS — D631 Anemia in chronic kidney disease: Secondary | ICD-10-CM | POA: Diagnosis not present

## 2022-09-04 DIAGNOSIS — D689 Coagulation defect, unspecified: Secondary | ICD-10-CM | POA: Diagnosis not present

## 2022-09-04 DIAGNOSIS — E1129 Type 2 diabetes mellitus with other diabetic kidney complication: Secondary | ICD-10-CM | POA: Diagnosis not present

## 2022-09-07 DIAGNOSIS — D631 Anemia in chronic kidney disease: Secondary | ICD-10-CM | POA: Diagnosis not present

## 2022-09-07 DIAGNOSIS — D689 Coagulation defect, unspecified: Secondary | ICD-10-CM | POA: Diagnosis not present

## 2022-09-07 DIAGNOSIS — N2581 Secondary hyperparathyroidism of renal origin: Secondary | ICD-10-CM | POA: Diagnosis not present

## 2022-09-07 DIAGNOSIS — E1129 Type 2 diabetes mellitus with other diabetic kidney complication: Secondary | ICD-10-CM | POA: Diagnosis not present

## 2022-09-07 DIAGNOSIS — N186 End stage renal disease: Secondary | ICD-10-CM | POA: Diagnosis not present

## 2022-09-07 DIAGNOSIS — Z992 Dependence on renal dialysis: Secondary | ICD-10-CM | POA: Diagnosis not present

## 2022-09-09 DIAGNOSIS — N2581 Secondary hyperparathyroidism of renal origin: Secondary | ICD-10-CM | POA: Diagnosis not present

## 2022-09-09 DIAGNOSIS — N186 End stage renal disease: Secondary | ICD-10-CM | POA: Diagnosis not present

## 2022-09-09 DIAGNOSIS — E1122 Type 2 diabetes mellitus with diabetic chronic kidney disease: Secondary | ICD-10-CM | POA: Diagnosis not present

## 2022-09-09 DIAGNOSIS — Z992 Dependence on renal dialysis: Secondary | ICD-10-CM | POA: Diagnosis not present

## 2022-09-09 DIAGNOSIS — D631 Anemia in chronic kidney disease: Secondary | ICD-10-CM | POA: Diagnosis not present

## 2022-09-09 DIAGNOSIS — D689 Coagulation defect, unspecified: Secondary | ICD-10-CM | POA: Diagnosis not present

## 2022-09-09 DIAGNOSIS — E1129 Type 2 diabetes mellitus with other diabetic kidney complication: Secondary | ICD-10-CM | POA: Diagnosis not present

## 2022-09-11 DIAGNOSIS — Z992 Dependence on renal dialysis: Secondary | ICD-10-CM | POA: Diagnosis not present

## 2022-09-11 DIAGNOSIS — N2581 Secondary hyperparathyroidism of renal origin: Secondary | ICD-10-CM | POA: Diagnosis not present

## 2022-09-11 DIAGNOSIS — E1129 Type 2 diabetes mellitus with other diabetic kidney complication: Secondary | ICD-10-CM | POA: Diagnosis not present

## 2022-09-11 DIAGNOSIS — D689 Coagulation defect, unspecified: Secondary | ICD-10-CM | POA: Diagnosis not present

## 2022-09-11 DIAGNOSIS — N186 End stage renal disease: Secondary | ICD-10-CM | POA: Diagnosis not present

## 2022-09-11 DIAGNOSIS — D631 Anemia in chronic kidney disease: Secondary | ICD-10-CM | POA: Diagnosis not present

## 2022-09-14 DIAGNOSIS — E1129 Type 2 diabetes mellitus with other diabetic kidney complication: Secondary | ICD-10-CM | POA: Diagnosis not present

## 2022-09-14 DIAGNOSIS — D631 Anemia in chronic kidney disease: Secondary | ICD-10-CM | POA: Diagnosis not present

## 2022-09-14 DIAGNOSIS — N2581 Secondary hyperparathyroidism of renal origin: Secondary | ICD-10-CM | POA: Diagnosis not present

## 2022-09-14 DIAGNOSIS — N186 End stage renal disease: Secondary | ICD-10-CM | POA: Diagnosis not present

## 2022-09-14 DIAGNOSIS — D689 Coagulation defect, unspecified: Secondary | ICD-10-CM | POA: Diagnosis not present

## 2022-09-14 DIAGNOSIS — Z992 Dependence on renal dialysis: Secondary | ICD-10-CM | POA: Diagnosis not present

## 2022-09-16 DIAGNOSIS — N186 End stage renal disease: Secondary | ICD-10-CM | POA: Diagnosis not present

## 2022-09-16 DIAGNOSIS — D631 Anemia in chronic kidney disease: Secondary | ICD-10-CM | POA: Diagnosis not present

## 2022-09-16 DIAGNOSIS — N2581 Secondary hyperparathyroidism of renal origin: Secondary | ICD-10-CM | POA: Diagnosis not present

## 2022-09-16 DIAGNOSIS — Z992 Dependence on renal dialysis: Secondary | ICD-10-CM | POA: Diagnosis not present

## 2022-09-16 DIAGNOSIS — D689 Coagulation defect, unspecified: Secondary | ICD-10-CM | POA: Diagnosis not present

## 2022-09-16 DIAGNOSIS — E1129 Type 2 diabetes mellitus with other diabetic kidney complication: Secondary | ICD-10-CM | POA: Diagnosis not present

## 2022-09-18 DIAGNOSIS — E1129 Type 2 diabetes mellitus with other diabetic kidney complication: Secondary | ICD-10-CM | POA: Diagnosis not present

## 2022-09-18 DIAGNOSIS — N186 End stage renal disease: Secondary | ICD-10-CM | POA: Diagnosis not present

## 2022-09-18 DIAGNOSIS — D689 Coagulation defect, unspecified: Secondary | ICD-10-CM | POA: Diagnosis not present

## 2022-09-18 DIAGNOSIS — D631 Anemia in chronic kidney disease: Secondary | ICD-10-CM | POA: Diagnosis not present

## 2022-09-18 DIAGNOSIS — N2581 Secondary hyperparathyroidism of renal origin: Secondary | ICD-10-CM | POA: Diagnosis not present

## 2022-09-18 DIAGNOSIS — Z992 Dependence on renal dialysis: Secondary | ICD-10-CM | POA: Diagnosis not present

## 2022-09-21 DIAGNOSIS — N2581 Secondary hyperparathyroidism of renal origin: Secondary | ICD-10-CM | POA: Diagnosis not present

## 2022-09-21 DIAGNOSIS — D631 Anemia in chronic kidney disease: Secondary | ICD-10-CM | POA: Diagnosis not present

## 2022-09-21 DIAGNOSIS — Z992 Dependence on renal dialysis: Secondary | ICD-10-CM | POA: Diagnosis not present

## 2022-09-21 DIAGNOSIS — E1129 Type 2 diabetes mellitus with other diabetic kidney complication: Secondary | ICD-10-CM | POA: Diagnosis not present

## 2022-09-21 DIAGNOSIS — N186 End stage renal disease: Secondary | ICD-10-CM | POA: Diagnosis not present

## 2022-09-21 DIAGNOSIS — D689 Coagulation defect, unspecified: Secondary | ICD-10-CM | POA: Diagnosis not present

## 2022-09-23 DIAGNOSIS — N186 End stage renal disease: Secondary | ICD-10-CM | POA: Diagnosis not present

## 2022-09-23 DIAGNOSIS — Z992 Dependence on renal dialysis: Secondary | ICD-10-CM | POA: Diagnosis not present

## 2022-09-23 DIAGNOSIS — E1129 Type 2 diabetes mellitus with other diabetic kidney complication: Secondary | ICD-10-CM | POA: Diagnosis not present

## 2022-09-23 DIAGNOSIS — N2581 Secondary hyperparathyroidism of renal origin: Secondary | ICD-10-CM | POA: Diagnosis not present

## 2022-09-23 DIAGNOSIS — D689 Coagulation defect, unspecified: Secondary | ICD-10-CM | POA: Diagnosis not present

## 2022-09-23 DIAGNOSIS — D631 Anemia in chronic kidney disease: Secondary | ICD-10-CM | POA: Diagnosis not present

## 2022-09-25 DIAGNOSIS — D689 Coagulation defect, unspecified: Secondary | ICD-10-CM | POA: Diagnosis not present

## 2022-09-25 DIAGNOSIS — E1129 Type 2 diabetes mellitus with other diabetic kidney complication: Secondary | ICD-10-CM | POA: Diagnosis not present

## 2022-09-25 DIAGNOSIS — N2581 Secondary hyperparathyroidism of renal origin: Secondary | ICD-10-CM | POA: Diagnosis not present

## 2022-09-25 DIAGNOSIS — N186 End stage renal disease: Secondary | ICD-10-CM | POA: Diagnosis not present

## 2022-09-25 DIAGNOSIS — Z992 Dependence on renal dialysis: Secondary | ICD-10-CM | POA: Diagnosis not present

## 2022-09-25 DIAGNOSIS — D631 Anemia in chronic kidney disease: Secondary | ICD-10-CM | POA: Diagnosis not present

## 2022-09-28 DIAGNOSIS — Z992 Dependence on renal dialysis: Secondary | ICD-10-CM | POA: Diagnosis not present

## 2022-09-28 DIAGNOSIS — D631 Anemia in chronic kidney disease: Secondary | ICD-10-CM | POA: Diagnosis not present

## 2022-09-28 DIAGNOSIS — N2581 Secondary hyperparathyroidism of renal origin: Secondary | ICD-10-CM | POA: Diagnosis not present

## 2022-09-28 DIAGNOSIS — E1129 Type 2 diabetes mellitus with other diabetic kidney complication: Secondary | ICD-10-CM | POA: Diagnosis not present

## 2022-09-28 DIAGNOSIS — N186 End stage renal disease: Secondary | ICD-10-CM | POA: Diagnosis not present

## 2022-09-28 DIAGNOSIS — D689 Coagulation defect, unspecified: Secondary | ICD-10-CM | POA: Diagnosis not present

## 2022-09-30 DIAGNOSIS — D689 Coagulation defect, unspecified: Secondary | ICD-10-CM | POA: Diagnosis not present

## 2022-09-30 DIAGNOSIS — E1129 Type 2 diabetes mellitus with other diabetic kidney complication: Secondary | ICD-10-CM | POA: Diagnosis not present

## 2022-09-30 DIAGNOSIS — Z992 Dependence on renal dialysis: Secondary | ICD-10-CM | POA: Diagnosis not present

## 2022-09-30 DIAGNOSIS — D631 Anemia in chronic kidney disease: Secondary | ICD-10-CM | POA: Diagnosis not present

## 2022-09-30 DIAGNOSIS — N2581 Secondary hyperparathyroidism of renal origin: Secondary | ICD-10-CM | POA: Diagnosis not present

## 2022-09-30 DIAGNOSIS — N186 End stage renal disease: Secondary | ICD-10-CM | POA: Diagnosis not present

## 2022-10-02 DIAGNOSIS — D689 Coagulation defect, unspecified: Secondary | ICD-10-CM | POA: Diagnosis not present

## 2022-10-02 DIAGNOSIS — E1129 Type 2 diabetes mellitus with other diabetic kidney complication: Secondary | ICD-10-CM | POA: Diagnosis not present

## 2022-10-02 DIAGNOSIS — Z992 Dependence on renal dialysis: Secondary | ICD-10-CM | POA: Diagnosis not present

## 2022-10-02 DIAGNOSIS — D631 Anemia in chronic kidney disease: Secondary | ICD-10-CM | POA: Diagnosis not present

## 2022-10-02 DIAGNOSIS — N186 End stage renal disease: Secondary | ICD-10-CM | POA: Diagnosis not present

## 2022-10-02 DIAGNOSIS — N2581 Secondary hyperparathyroidism of renal origin: Secondary | ICD-10-CM | POA: Diagnosis not present

## 2022-10-05 DIAGNOSIS — N2581 Secondary hyperparathyroidism of renal origin: Secondary | ICD-10-CM | POA: Diagnosis not present

## 2022-10-05 DIAGNOSIS — D689 Coagulation defect, unspecified: Secondary | ICD-10-CM | POA: Diagnosis not present

## 2022-10-05 DIAGNOSIS — N186 End stage renal disease: Secondary | ICD-10-CM | POA: Diagnosis not present

## 2022-10-05 DIAGNOSIS — E1129 Type 2 diabetes mellitus with other diabetic kidney complication: Secondary | ICD-10-CM | POA: Diagnosis not present

## 2022-10-05 DIAGNOSIS — D631 Anemia in chronic kidney disease: Secondary | ICD-10-CM | POA: Diagnosis not present

## 2022-10-05 DIAGNOSIS — Z992 Dependence on renal dialysis: Secondary | ICD-10-CM | POA: Diagnosis not present

## 2022-10-06 ENCOUNTER — Telehealth: Payer: Self-pay

## 2022-10-06 NOTE — Telephone Encounter (Signed)
Contacted [facility] to schedule their annual wellness visit. Appointment made for 10/14/22.  Norton Blizzard, [place] ([facility])  [facility] [phone]

## 2022-10-07 DIAGNOSIS — D631 Anemia in chronic kidney disease: Secondary | ICD-10-CM | POA: Diagnosis not present

## 2022-10-07 DIAGNOSIS — E1129 Type 2 diabetes mellitus with other diabetic kidney complication: Secondary | ICD-10-CM | POA: Diagnosis not present

## 2022-10-07 DIAGNOSIS — N2581 Secondary hyperparathyroidism of renal origin: Secondary | ICD-10-CM | POA: Diagnosis not present

## 2022-10-07 DIAGNOSIS — N186 End stage renal disease: Secondary | ICD-10-CM | POA: Diagnosis not present

## 2022-10-07 DIAGNOSIS — D689 Coagulation defect, unspecified: Secondary | ICD-10-CM | POA: Diagnosis not present

## 2022-10-07 DIAGNOSIS — Z992 Dependence on renal dialysis: Secondary | ICD-10-CM | POA: Diagnosis not present

## 2022-10-09 ENCOUNTER — Other Ambulatory Visit: Payer: Self-pay | Admitting: Internal Medicine

## 2022-10-09 DIAGNOSIS — N186 End stage renal disease: Secondary | ICD-10-CM | POA: Diagnosis not present

## 2022-10-09 DIAGNOSIS — D689 Coagulation defect, unspecified: Secondary | ICD-10-CM | POA: Diagnosis not present

## 2022-10-09 DIAGNOSIS — N2581 Secondary hyperparathyroidism of renal origin: Secondary | ICD-10-CM | POA: Diagnosis not present

## 2022-10-09 DIAGNOSIS — Z992 Dependence on renal dialysis: Secondary | ICD-10-CM | POA: Diagnosis not present

## 2022-10-09 DIAGNOSIS — E1129 Type 2 diabetes mellitus with other diabetic kidney complication: Secondary | ICD-10-CM | POA: Diagnosis not present

## 2022-10-09 DIAGNOSIS — D631 Anemia in chronic kidney disease: Secondary | ICD-10-CM | POA: Diagnosis not present

## 2022-10-10 DIAGNOSIS — N186 End stage renal disease: Secondary | ICD-10-CM | POA: Diagnosis not present

## 2022-10-10 DIAGNOSIS — Z992 Dependence on renal dialysis: Secondary | ICD-10-CM | POA: Diagnosis not present

## 2022-10-10 DIAGNOSIS — E1122 Type 2 diabetes mellitus with diabetic chronic kidney disease: Secondary | ICD-10-CM | POA: Diagnosis not present

## 2022-10-11 ENCOUNTER — Encounter: Payer: Self-pay | Admitting: Podiatry

## 2022-10-11 ENCOUNTER — Ambulatory Visit (INDEPENDENT_AMBULATORY_CARE_PROVIDER_SITE_OTHER): Payer: Medicare Other | Admitting: Podiatry

## 2022-10-11 DIAGNOSIS — Q828 Other specified congenital malformations of skin: Secondary | ICD-10-CM | POA: Diagnosis not present

## 2022-10-11 DIAGNOSIS — E1142 Type 2 diabetes mellitus with diabetic polyneuropathy: Secondary | ICD-10-CM

## 2022-10-11 DIAGNOSIS — M79676 Pain in unspecified toe(s): Secondary | ICD-10-CM | POA: Diagnosis not present

## 2022-10-11 DIAGNOSIS — B351 Tinea unguium: Secondary | ICD-10-CM

## 2022-10-11 NOTE — Progress Notes (Signed)
This patient returns to my office for at risk foot care.  This patient requires this care by a professional since this patient will be at risk due to having  ESRD, CKD and diabetes.  This patient is unable to cut nails himself since the patient cannot reach his nails.These nails are painful walking and wearing shoes.  This patient presents for at risk foot care today.  General Appearance  Alert, conversant and in no acute stress.  Vascular  Dorsalis pedis and posterior tibial  pulses are palpable  bilaterally.  Capillary return is within normal limits  bilaterally. Temperature is within normal limits  bilaterally.  Neurologic  Senn-Weinstein monofilament wire test diminished   bilaterally. Muscle power within normal limits bilaterally.  Nails Thick disfigured discolored nails with subungual debris  from hallux to fifth toes bilaterally. No evidence of bacterial infection or drainage bilaterally.  Orthopedic  No limitations of motion  feet .  No crepitus or effusions noted.  No bony pathology or digital deformities noted.  Skin  normotropic skin  noted bilaterally.  No signs of infections or ulcers noted.   Porokeratosis sub 4  B/L.   symptomatic.  Onychomycosis  Pain in right toes  Pain in left toes  Porokeratosis sub 4 left foot.  Consent was obtained for treatment procedures.   Mechanical debridement of nails 1-5  bilaterally performed with a nail nipper.  Filed with dremel without incident.  Debride porokeratosis with # 15 blade and dremel tool.   Return office visit   9 weeks                  Told patient to return for periodic foot care and evaluation due to potential at risk complications.   Gardiner Barefoot DPM

## 2022-10-12 DIAGNOSIS — D631 Anemia in chronic kidney disease: Secondary | ICD-10-CM | POA: Diagnosis not present

## 2022-10-12 DIAGNOSIS — N186 End stage renal disease: Secondary | ICD-10-CM | POA: Diagnosis not present

## 2022-10-12 DIAGNOSIS — D689 Coagulation defect, unspecified: Secondary | ICD-10-CM | POA: Diagnosis not present

## 2022-10-12 DIAGNOSIS — E1129 Type 2 diabetes mellitus with other diabetic kidney complication: Secondary | ICD-10-CM | POA: Diagnosis not present

## 2022-10-12 DIAGNOSIS — Z992 Dependence on renal dialysis: Secondary | ICD-10-CM | POA: Diagnosis not present

## 2022-10-12 DIAGNOSIS — N2581 Secondary hyperparathyroidism of renal origin: Secondary | ICD-10-CM | POA: Diagnosis not present

## 2022-10-14 ENCOUNTER — Ambulatory Visit: Payer: Medicare Other | Admitting: Internal Medicine

## 2022-10-14 ENCOUNTER — Ambulatory Visit (INDEPENDENT_AMBULATORY_CARE_PROVIDER_SITE_OTHER): Payer: Medicare Other

## 2022-10-14 VITALS — Ht 72.0 in | Wt 182.0 lb

## 2022-10-14 DIAGNOSIS — N186 End stage renal disease: Secondary | ICD-10-CM | POA: Diagnosis not present

## 2022-10-14 DIAGNOSIS — E1129 Type 2 diabetes mellitus with other diabetic kidney complication: Secondary | ICD-10-CM | POA: Diagnosis not present

## 2022-10-14 DIAGNOSIS — Z0000 Encounter for general adult medical examination without abnormal findings: Secondary | ICD-10-CM | POA: Diagnosis not present

## 2022-10-14 DIAGNOSIS — D631 Anemia in chronic kidney disease: Secondary | ICD-10-CM | POA: Diagnosis not present

## 2022-10-14 DIAGNOSIS — Z992 Dependence on renal dialysis: Secondary | ICD-10-CM | POA: Diagnosis not present

## 2022-10-14 DIAGNOSIS — N2581 Secondary hyperparathyroidism of renal origin: Secondary | ICD-10-CM | POA: Diagnosis not present

## 2022-10-14 DIAGNOSIS — D689 Coagulation defect, unspecified: Secondary | ICD-10-CM | POA: Diagnosis not present

## 2022-10-14 NOTE — Progress Notes (Signed)
I connected with  Dagoberto Reef and mother on 10/14/22 by a audio enabled telemedicine application and verified that I am speaking with the correct person using two identifiers.  Patient Location: Home  Provider Location: Office/Clinic  I discussed the limitations of evaluation and management by telemedicine. The patient expressed understanding and agreed to proceed.  Subjective:   SABRI FETTEROLF is a 62 y.o. male who presents for Medicare Annual/Subsequent preventive examination.  Review of Systems     Cardiac Risk Factors include: advanced age (>34men, >20 women);diabetes mellitus;dyslipidemia;family history of premature cardiovascular disease;hypertension;male gender;sedentary lifestyle     Objective:    Today's Vitals   10/14/22 1306  Weight: 182 lb (82.6 kg)  Height: 6' (1.829 m)  PainSc: 0-No pain   Body mass index is 24.68 kg/m.     10/14/2022    1:09 PM 10/12/2021   10:49 AM 07/26/2018    8:42 AM 03/24/2018    1:31 PM 03/01/2018    7:34 AM 01/20/2018   10:54 AM 12/23/2017   11:15 AM  Advanced Directives  Does Patient Have a Medical Advance Directive? Yes No Yes Yes Yes Yes Yes  Type of Materials engineer of [place];Living will Healthcare Power of [place] of [place] of [place]  Does patient want to make changes to medical advance directive?       No - Patient declined  Copy of [place] in Chart? No - copy requested  No - copy requested Yes Yes  Yes  Would patient like information on creating a medical advance directive?  No - Patient declined No - Patient declined        Current Medications (verified) Outpatient Encounter Medications as of 10/14/2022  Medication Sig   acetaminophen (TYLENOL) 650 MG CR tablet Take by mouth.   aspirin 81 MG EC tablet Take by mouth.   cyanocobalamin 1000 MCG tablet Take by mouth.   diltiazem (CARDIZEM LA)  360 MG 24 hr tablet Take by mouth.   diphenhydrAMINE (BENADRYL) 25 mg capsule Take by mouth.   Dulaglutide (TRULICITY) 1.5 0000000 SOPN INJECT 0.5 ML INTO THE SKIN ONCE A WEEK   ferric citrate (AURYXIA) 1 GM 210 MG(Fe) tablet Take by mouth.   glucose blood (ONE TOUCH ULTRA TEST) test strip Use as instructed once daily E11.9   hydrALAZINE (APRESOLINE) 100 MG tablet Take by mouth.   ketoconazole (NIZORAL) 2 % cream Apply topically.   Lancets (ONETOUCH ULTRASOFT) lancets 1 each by Other route 2 (two) times daily. Use to check blood sugars once a day Dx e11.9   lovastatin (MEVACOR) 40 MG tablet Take 1 tablet (40 mg total) by mouth at bedtime.   metoprolol succinate (TOPROL-XL) 50 MG 24 hr tablet Take 1 tablet (50 mg total) by mouth daily. Take with or immediately following a meal.   Multiple Vitamin (MULTIVITAMIN ADULT PO) Take by mouth.   oxyCODONE-acetaminophen (PERCOCET) 5-325 MG tablet Take 1 tablet by mouth every 6 (six) hours as needed for severe pain.   pioglitazone (ACTOS) 15 MG tablet TAKE 1 TABLET BY MOUTH EVERY DAY   rosuvastatin (CRESTOR) 20 MG tablet Take 1 tablet (20 mg total) by mouth daily.   sitaGLIPtin (JANUVIA) 100 MG tablet Take by mouth.   No facility-administered encounter medications on file as of 10/14/2022.    Allergies (verified) Lipitor [atorvastatin]   History: Past Medical History:  Diagnosis Date   Alcohol abuse  09/18/2011   Allergic rhinitis, cause unspecified 09/20/2011   Anemia, unspecified 09/18/2011   Arthritis    Childhood asthma 09/20/2011   CKD (chronic kidney disease) stage 5, GFR less than 15 ml/min    Dialysis - T/Th/Sa   Colitis 09/18/2011   Dementia 09/18/2011   Diabetes mellitus    Diverticulosis of colon without hemorrhage 02/27/2014   Foot ulcer 09/18/2011   High cholesterol    HTN (hypertension) 09/18/2011   Hyperlipidemia 11/08/2011   Hypertension    Impaired glucose tolerance 09/18/2011   Pneumonia 09/18/2011   Pre-ulcerative corn or callous  11/20/2011   Stroke    left leg deficit   Type II or unspecified type diabetes mellitus without mention of complication, uncontrolled 09/20/2011   Wears glasses    Past Surgical History:  Procedure Laterality Date   AV FISTULA PLACEMENT Left 12/23/2017   Procedure: CREATION OF BASILIAC - CEPHALIC  ARTERIOVENOUS (AV) FISTULA  STAGE ONE;  Surgeon: Waynetta Sandy, MD;  Location: [place];  Service: Vascular;  Laterality: Left;   AV FISTULA PLACEMENT Left 07/26/2018   Procedure: INSERTION OF ARTERIOVENOUS (AV) GORE-TEX GRAFT ARM;  Surgeon: Waynetta Sandy, MD;  Location: [place];  Service: Vascular;  Laterality: Left;   [place] Left 03/01/2018   Procedure: BASILIC VEIN TRANSPOSITION SECOND STAGE;  Surgeon: Waynetta Sandy, MD;  Location: [facility] OR;  Service: Vascular;  Laterality: Left;   INSERTION OF DIALYSIS CATHETER Right 12/23/2017   Procedure: INSERTION OF PALINDROME  DIALYSIS CATHETER;  Surgeon: Waynetta Sandy, MD;  Location: [place];  Service: Vascular;  Laterality: Right;   THROMBECTOMY BRACHIAL ARTERY Left 07/26/2018   Procedure: THROMBECTOMY LEFT UPPER EXTREMITY;  Surgeon: Waynetta Sandy, MD;  Location: [place];  Service: Vascular;  Laterality: Left;   [place] Left 07/26/2018   Procedure: STENT LEFT AXILLARY VEIN;  Surgeon: Waynetta Sandy, MD;  Location[facility] OR;  Service: Vascular;  Laterality: Left;   Family History  Problem Relation Age of Onset   Prostate cancer Paternal Grandfather    Diabetes Mother    Heart disease Mother    Transient ischemic attack Mother    Diabetes Sister    Diabetes Maternal Grandfather    Colon cancer Neg Hx    Social History   Socioeconomic History   Marital status: Single    Spouse name: Not on file   Number of children: Not on file   Years of education: Not on file   Highest education level: Not on file  Occupational History   Not on file  Tobacco Use    Smoking status: Former    Packs/day: .5    Types: Cigarettes   Smokeless tobacco: Never  Vaping Use   Vaping Use: Never used  Substance and Sexual Activity   Alcohol use: Not Currently    Alcohol/week: 2.0 standard drinks of alcohol    Types: 2 Cans of beer per week   Drug use: No   Sexual activity: Not on file  Other Topics Concern   Not on file  Social History Narrative   Not on file   Social Determinants of Health   Financial Resource Strain: Low Risk  (10/14/2022)   Overall Financial Resource Strain (CARDIA)    Difficulty of Paying Living Expenses: Not hard at all  Food Insecurity: No Food Insecurity (10/14/2022)   Hunger Vital Sign    Worried About Running Out of Food in the Last Year: Never true  Ran Out of Food in the Last Year: Never true  Transportation Needs: No Transportation Needs (10/14/2022)   PRAPARE - Hydrologist (Medical): No    Lack of Transportation (Non-Medical): No  Physical Activity: Inactive (10/14/2022)   Exercise Vital Sign    Days of Exercise per Week: 0 days    Minutes of Exercise per Session: 0 min  Stress: No Stress Concern Present (10/14/2022)   [place]    Feeling of Stress : Not at all  Social Connections: Moderately Integrated (10/14/2022)   Social Connection and Isolation Panel [NHANES]    Frequency of Communication with Friends and Family: More than three times a week    Frequency of Social Gatherings with Friends and Family: More than three times a week    Attends Religious Services: More than 4 times per year    Active Member of [employer] or Organizations: Yes    Attends Music therapist: More than 4 times per year    Marital Status: Never married    Tobacco Counseling Counseling given: Not Answered   Clinical Intake:  Pre-visit preparation completed: Yes  Pain : No/denies pain Pain Score: 0-No pain     BMI - recorded:  24.68 Nutritional Status: BMI of 19-24  Normal Nutritional Risks: None Diabetes: Yes CBG done?: No (no) Did pt. bring in CBG monitor from home?: No  How often do you need to have someone help you when you read instructions, pamphlets, or other written materials from your doctor or pharmacy?: 5 - Always What is the last grade level you completed in school?: 11th grade  Nutrition Risk Assessment:  Has the patient had any N/V/D within the last 2 months?  No  Does the patient have any non-healing wounds?  No  Has the patient had any unintentional weight loss or weight gain?  No   Diabetes:  Is the patient diabetic?  Yes  If diabetic, was a CBG obtained today?  No  Did the patient bring in their glucometer from home?  No  How often do you monitor your CBG's? Patient refuses to check glucose..   Financial Strains and Diabetes Management:  Are you having any financial strains with the device, your supplies or your medication? No .  Does the patient want to be seen by Chronic Care Management for management of their diabetes?  No  Would the patient like to be referred to a Nutritionist or for Diabetic Management?  No   Diabetic Exams:  Diabetic Eye Exam: Completed 03/01/2022 Diabetic Foot Exam: Completed 10/11/2022   Interpreter Needed?: No  Information entered by :: Lisette Abu, LPN.   Activities of Daily Living    10/14/2022    1:12 PM  In your present state of health, do you have any difficulty performing the following activities:  Hearing? 0  Vision? 1  Difficulty concentrating or making decisions? 1  Walking or climbing stairs? 1  Dressing or bathing? 0  Doing errands, shopping? 1  Preparing Food and eating ? N  Using the Toilet? N  In the past six months, have you accidently leaked urine? N  Do you have problems with loss of bowel control? N  Managing your Medications? Y  Managing your Finances? Y  Housekeeping or managing your Housekeeping? Y    Patient Care  Team: Biagio Borg, MD as PCP - General (Internal Medicine) [place], P.A. as Consulting Physician (Ophthalmology) [place],  [place] Groat, Harrell Gave, MD as Consulting Physician (Ophthalmology)  Indicate any recent Medical Services you may have received from other than [facility] providers in the past year (date may be approximate).     Assessment:   This is a routine wellness examination for Juell.  Hearing/Vision screen Hearing Screening - Comments:: Denies hearing difficulties   Vision Screening - Comments:: Wears rx glasses - up to date with routine eye exams with Warden Fillers, MD.   Dietary issues and exercise activities discussed: Current Exercise Habits: The patient does not participate in regular exercise at present, Intensity: Not Applicable, Exercise limited by: psychological condition(s);neurologic condition(s)   Goals Addressed   None   Depression Screen    10/14/2022    1:12 PM 04/14/2022   10:31 AM 10/12/2021   10:52 AM 10/12/2021   10:10 AM 09/26/2020   10:49 AM 09/26/2020   10:12 AM 09/12/2019   11:04 AM  PHQ 2/9 Scores  PHQ - 2 Score 0 0 0 0 0 0 0  PHQ- 9 Score 0 2         Fall Risk    10/14/2022    1:10 PM 04/14/2022   10:31 AM 10/12/2021   10:52 AM 10/12/2021   10:10 AM 09/26/2020   10:49 AM  Fall Risk   Falls in the past year? 0 0 0  0  Number falls in past yr: 0  0 0   Injury with Fall? 0  0 0   Risk for fall due to : No Fall Risks No Fall Risks     Follow up Falls prevention discussed Falls evaluation completed       FALL RISK PREVENTION PERTAINING TO THE HOME:  Any stairs in or around the home? No  If so, are there any without handrails? No  Home free of loose throw rugs in walkways, pet beds, electrical cords, etc? Yes  Adequate lighting in your home to reduce risk of falls? Yes   ASSISTIVE DEVICES UTILIZED TO PREVENT FALLS:  Life alert? No  Use of a cane, walker or w/c? Yes  Grab bars in the bathroom? Yes   Shower chair or bench in shower? Yes  Elevated toilet seat or a handicapped toilet? Yes   TIMED UP AND GO:  Was the test performed? No . Telephonic Visit  Cognitive Function:    10/14/2022    1:16 PM  MMSE - Mini Mental State Exam  Not completed: Unable to complete        Immunizations Immunization History  Administered Date(s) Administered   Hepatitis B, ADULT 01/10/2018, 02/11/2018, 03/14/2018, 07/10/2018   Influenza,inj,Quad PF,6+ Mos 02/27/2014, 08/22/2015, 09/06/2018, 03/09/2019, 05/06/2020, 04/01/2021, 04/21/2021, 04/14/2022   Moderna Sars-Covid-2 Vaccination 09/20/2019, 10/25/2019   Pneumococcal Conjugate-13 09/06/2018, 10/31/2018   Pneumococcal Polysaccharide-23 08/20/2014, 08/22/2015   Tdap 09/05/2012    TDAP status: Due, Education has been provided regarding the importance of this vaccine. Advised may receive this vaccine at local pharmacy or Health Dept. Aware to provide a copy of the vaccination record if obtained from local pharmacy or Health Dept. Verbalized acceptance and understanding.  Flu Vaccine status: Up to date  Pneumococcal vaccine status: Up to date  Covid-19 vaccine status: Completed vaccines  Qualifies for Shingles Vaccine? Yes   Zostavax completed No   Shingrix Completed?: No.    Education has been provided regarding the importance of this vaccine. Patient has been advised to call insurance company to determine out of pocket expense if they have not  yet received this vaccine. Advised may also receive vaccine at local pharmacy or Health Dept. Verbalized acceptance and understanding.  Screening Tests Health Maintenance  Topic Date Due   Zoster Vaccines- Shingrix (1 of 2) Never done   DTaP/Tdap/Td (2 - Td or Tdap) 09/05/2022   HEMOGLOBIN A1C  10/14/2022   INFLUENZA VACCINE  02/10/2023   OPHTHALMOLOGY EXAM  03/02/2023   FOOT EXAM  10/11/2023   COLONOSCOPY (Pts 45-62yrs Insurance coverage will need to be confirmed)  10/12/2023   Medicare Annual  Wellness (AWV)  10/14/2023   Hepatitis C Screening  Completed   HIV Screening  Completed   HPV VACCINES  Aged Out   COVID-19 Vaccine  Discontinued    Health Maintenance  Health Maintenance Due  Topic Date Due   Zoster Vaccines- Shingrix (1 of 2) Never done   DTaP/Tdap/Td (2 - Td or Tdap) 09/05/2022   HEMOGLOBIN A1C  10/14/2022    Colorectal cancer screening: Type of screening: Colonoscopy. Completed 10/11/2013. Repeat every 10 years  Lung Cancer Screening: (Low Dose CT Chest recommended if Age 51-80 years, 30 pack-year currently smoking OR have quit w/in 15years.) does not qualify.   Lung Cancer Screening Referral: no  Additional Screening:  Hepatitis C Screening: does qualify; Completed 08/22/2015  Vision Screening: Recommended annual ophthalmology exams for early detection of glaucoma and other disorders of the eye. Is the patient up to date with their annual eye exam?  Yes  Who is the provider or what is the name of the office in which the patient attends annual eye exams? Warden Fillers, MD. If pt is not established with a provider, would they like to be referred to a provider to establish care? No .   Dental Screening: Recommended annual dental exams for proper oral hygiene  Community Resource Referral / Chronic Care Management: CRR required this visit?  No   CCM required this visit?  No      Plan:     I have personally reviewed and noted the following in the patient's chart:   Medical and social history Use of alcohol, tobacco or illicit drugs  Current medications and supplements including opioid prescriptions. Patient is not currently taking opioid prescriptions. Functional ability and status Nutritional status Physical activity Advanced directives List of other physicians Hospitalizations, surgeries, and ER visits in previous 12 months Vitals Screenings to include cognitive, depression, and falls Referrals and appointments  In addition, I have  reviewed and discussed with patient certain preventive protocols, quality metrics, and best practice recommendations. A written personalized care plan for preventive services as well as general preventive health recommendations were provided to patient.     Sheral Flow, LPN   [id number]   Nurse Notes:  Patient has current diagnosis of cognitive impairment.  Patient is followed by neurology for ongoing assessment. Patient is unable to complete screening 6CIT or MMSE.

## 2022-10-14 NOTE — Patient Instructions (Signed)
Christopher Lopez , Thank you for taking time to come for your Medicare Wellness Visit. I appreciate your ongoing commitment to your health goals. Please review the following plan we discussed and let me know if I can assist you in the future.   These are the goals we discussed:  Goals      Patient Stated     Patient declined health goal at this time.        This is a list of the screening recommended for you and due dates:  Health Maintenance  Topic Date Due   Zoster (Shingles) Vaccine (1 of 2) Never done   DTaP/Tdap/Td vaccine (2 - Td or Tdap) 09/05/2022   Hemoglobin A1C  10/14/2022   Flu Shot  02/10/2023   Eye exam for diabetics  03/02/2023   Complete foot exam   10/11/2023   Colon Cancer Screening  10/12/2023   Medicare Annual Wellness Visit  10/14/2023   Hepatitis C Screening: USPSTF Recommendation to screen - Ages 18-79 yo.  Completed   HIV Screening  Completed   HPV Vaccine  Aged Out   COVID-19 Vaccine  Discontinued    Advanced directives: Yes; patient has Power of Attorney.  Conditions/risks identified: Yes; Type II Diabetes   Next appointment: Follow up in one year for your annual wellness visit.  Preventive Care 40-64 Years, Male Preventive care refers to lifestyle choices and visits with your health care provider that can promote health and wellness. What does preventive care include? A yearly physical exam. This is also called an annual well check. Dental exams once or twice a year. Routine eye exams. Ask your health care provider how often you should have your eyes checked. Personal lifestyle choices, including: Daily care of your teeth and gums. Regular physical activity. Eating a healthy diet. Avoiding tobacco and drug use. Limiting alcohol use. Practicing safe sex. Taking low-dose aspirin every day starting at age 30. What happens during an annual well check? The services and screenings done by your health care provider during your annual well check will  depend on your age, overall health, lifestyle risk factors, and family history of disease. Counseling  Your health care provider may ask you questions about your: Alcohol use. Tobacco use. Drug use. Emotional well-being. Home and relationship well-being. Sexual activity. Eating habits. Work and work Statistician. Screening  You may have the following tests or measurements: Height, weight, and BMI. Blood pressure. Lipid and cholesterol levels. These may be checked every 5 years, or more frequently if you are over 24 years old. Skin check. Lung cancer screening. You may have this screening every year starting at age 67 if you have a 30-pack-year history of smoking and currently smoke or have quit within the past 15 years. Fecal occult blood test (FOBT) of the stool. You may have this test every year starting at age 24. Flexible sigmoidoscopy or colonoscopy. You may have a sigmoidoscopy every 5 years or a colonoscopy every 10 years starting at age 58. Prostate cancer screening. Recommendations will vary depending on your family history and other risks. Hepatitis C blood test. Hepatitis B blood test. Sexually transmitted disease (STD) testing. Diabetes screening. This is done by checking your blood sugar (glucose) after you have not eaten for a while (fasting). You may have this done every 1-3 years. Discuss your test results, treatment options, and if necessary, the need for more tests with your health care provider. Vaccines  Your health care provider may recommend certain vaccines, such as: Influenza  vaccine. This is recommended every year. Tetanus, diphtheria, and acellular pertussis (Tdap, Td) vaccine. You may need a Td booster every 10 years. Zoster vaccine. You may need this after age 15. Pneumococcal 13-valent conjugate (PCV13) vaccine. You may need this if you have certain conditions and have not been vaccinated. Pneumococcal polysaccharide (PPSV23) vaccine. You may need one or  two doses if you smoke cigarettes or if you have certain conditions. Talk to your health care provider about which screenings and vaccines you need and how often you need them. This information is not intended to replace advice given to you by your health care provider. Make sure you discuss any questions you have with your health care provider. Document Released: 07/25/2015 Document Revised: 03/17/2016 Document Reviewed: 04/29/2015 Elsevier Interactive Patient Education  2017 [place] Prevention in the Home Falls can cause injuries. They can happen to people of all ages. There are many things you can do to make your home safe and to help prevent falls. What can I do on the outside of my home? Regularly fix the edges of walkways and driveways and fix any cracks. Remove anything that might make you trip as you walk through a door, such as a raised step or threshold. Trim any bushes or trees on the path to your home. Use bright outdoor lighting. Clear any walking paths of anything that might make someone trip, such as rocks or tools. Regularly check to see if handrails are loose or broken. Make sure that both sides of any steps have handrails. Any raised decks and porches should have guardrails on the edges. Have any leaves, snow, or ice cleared regularly. Use sand or salt on walking paths during winter. Clean up any spills in your garage right away. This includes oil or grease spills. What can I do in the bathroom? Use night lights. Install grab bars by the toilet and in the tub and shower. Do not use towel bars as grab bars. Use non-skid mats or decals in the tub or shower. If you need to sit down in the shower, use a plastic, non-slip stool. Keep the floor dry. Clean up any water that spills on the floor as soon as it happens. Remove soap buildup in the tub or shower regularly. Attach bath mats securely with double-sided non-slip rug tape. Do not have throw rugs and other  things on the floor that can make you trip. What can I do in the bedroom? Use night lights. Make sure that you have a light by your bed that is easy to reach. Do not use any sheets or blankets that are too big for your bed. They should not hang down onto the floor. Have a firm chair that has side arms. You can use this for support while you get dressed. Do not have throw rugs and other things on the floor that can make you trip. What can I do in the kitchen? Clean up any spills right away. Avoid walking on wet floors. Keep items that you use a lot in easy-to-reach places. If you need to reach something above you, use a strong step stool that has a grab bar. Keep electrical cords out of the way. Do not use floor polish or wax that makes floors slippery. If you must use wax, use non-skid floor wax. Do not have throw rugs and other things on the floor that can make you trip. What can I do with my stairs? Do not leave any items on the  stairs. Make sure that there are handrails on both sides of the stairs and use them. Fix handrails that are broken or loose. Make sure that handrails are as long as the stairways. Check any carpeting to make sure that it is firmly attached to the stairs. Fix any carpet that is loose or worn. Avoid having throw rugs at the top or bottom of the stairs. If you do have throw rugs, attach them to the floor with carpet tape. Make sure that you have a light switch at the top of the stairs and the bottom of the stairs. If you do not have them, ask someone to add them for you. What else can I do to help prevent falls? Wear shoes that: Do not have high heels. Have rubber bottoms. Are comfortable and fit you well. Are closed at the toe. Do not wear sandals. If you use a stepladder: Make sure that it is fully opened. Do not climb a closed stepladder. Make sure that both sides of the stepladder are locked into place. Ask someone to hold it for you, if possible. Clearly  mark and make sure that you can see: Any grab bars or handrails. First and last steps. Where the edge of each step is. Use tools that help you move around (mobility aids) if they are needed. These include: Canes. Walkers. Scooters. Crutches. Turn on the lights when you go into a dark area. Replace any light bulbs as soon as they burn out. Set up your furniture so you have a clear path. Avoid moving your furniture around. If any of your floors are uneven, fix them. If there are any pets around you, be aware of where they are. Review your medicines with your doctor. Some medicines can make you feel dizzy. This can increase your chance of falling. Ask your doctor what other things that you can do to help prevent falls. This information is not intended to replace advice given to you by your health care provider. Make sure you discuss any questions you have with your health care provider. Document Released: 04/24/2009 Document Revised: 12/04/2015 Document Reviewed: 08/02/2014 Elsevier Interactive Patient Education  2017 [employer].

## 2022-10-15 ENCOUNTER — Telehealth: Payer: Self-pay | Admitting: Internal Medicine

## 2022-10-15 MED ORDER — BYDUREON BCISE 2 MG/0.85ML ~~LOC~~ AUIJ: 0.8500 mL | AUTO-INJECTOR | SUBCUTANEOUS | 1 refills | Status: DC

## 2022-10-15 NOTE — Telephone Encounter (Signed)
FYI Pharmacist called to let us know that  Dulaglutide (TRULICITY) 1.5 MG/0.5ML SOPN is on back order and they are unable to fill it.

## 2022-10-15 NOTE — Telephone Encounter (Signed)
Ok to try change to Bydureon 2 mg weekly - done erx to [facility]

## 2022-10-16 DIAGNOSIS — N2581 Secondary hyperparathyroidism of renal origin: Secondary | ICD-10-CM | POA: Diagnosis not present

## 2022-10-16 DIAGNOSIS — D631 Anemia in chronic kidney disease: Secondary | ICD-10-CM | POA: Diagnosis not present

## 2022-10-16 DIAGNOSIS — N186 End stage renal disease: Secondary | ICD-10-CM | POA: Diagnosis not present

## 2022-10-16 DIAGNOSIS — Z992 Dependence on renal dialysis: Secondary | ICD-10-CM | POA: Diagnosis not present

## 2022-10-16 DIAGNOSIS — D689 Coagulation defect, unspecified: Secondary | ICD-10-CM | POA: Diagnosis not present

## 2022-10-16 DIAGNOSIS — E1129 Type 2 diabetes mellitus with other diabetic kidney complication: Secondary | ICD-10-CM | POA: Diagnosis not present

## 2022-10-18 NOTE — Telephone Encounter (Signed)
Notified pt mom w/MD response.Marland KitchenRaechel Chute

## 2022-10-19 DIAGNOSIS — N2581 Secondary hyperparathyroidism of renal origin: Secondary | ICD-10-CM | POA: Diagnosis not present

## 2022-10-19 DIAGNOSIS — D631 Anemia in chronic kidney disease: Secondary | ICD-10-CM | POA: Diagnosis not present

## 2022-10-19 DIAGNOSIS — D689 Coagulation defect, unspecified: Secondary | ICD-10-CM | POA: Diagnosis not present

## 2022-10-19 DIAGNOSIS — E1129 Type 2 diabetes mellitus with other diabetic kidney complication: Secondary | ICD-10-CM | POA: Diagnosis not present

## 2022-10-19 DIAGNOSIS — N186 End stage renal disease: Secondary | ICD-10-CM | POA: Diagnosis not present

## 2022-10-19 DIAGNOSIS — Z992 Dependence on renal dialysis: Secondary | ICD-10-CM | POA: Diagnosis not present

## 2022-10-21 DIAGNOSIS — D689 Coagulation defect, unspecified: Secondary | ICD-10-CM | POA: Diagnosis not present

## 2022-10-21 DIAGNOSIS — N2581 Secondary hyperparathyroidism of renal origin: Secondary | ICD-10-CM | POA: Diagnosis not present

## 2022-10-21 DIAGNOSIS — D631 Anemia in chronic kidney disease: Secondary | ICD-10-CM | POA: Diagnosis not present

## 2022-10-21 DIAGNOSIS — Z992 Dependence on renal dialysis: Secondary | ICD-10-CM | POA: Diagnosis not present

## 2022-10-21 DIAGNOSIS — E1129 Type 2 diabetes mellitus with other diabetic kidney complication: Secondary | ICD-10-CM | POA: Diagnosis not present

## 2022-10-21 DIAGNOSIS — N186 End stage renal disease: Secondary | ICD-10-CM | POA: Diagnosis not present

## 2022-10-23 DIAGNOSIS — Z992 Dependence on renal dialysis: Secondary | ICD-10-CM | POA: Diagnosis not present

## 2022-10-23 DIAGNOSIS — D631 Anemia in chronic kidney disease: Secondary | ICD-10-CM | POA: Diagnosis not present

## 2022-10-23 DIAGNOSIS — D689 Coagulation defect, unspecified: Secondary | ICD-10-CM | POA: Diagnosis not present

## 2022-10-23 DIAGNOSIS — N2581 Secondary hyperparathyroidism of renal origin: Secondary | ICD-10-CM | POA: Diagnosis not present

## 2022-10-23 DIAGNOSIS — E1129 Type 2 diabetes mellitus with other diabetic kidney complication: Secondary | ICD-10-CM | POA: Diagnosis not present

## 2022-10-23 DIAGNOSIS — N186 End stage renal disease: Secondary | ICD-10-CM | POA: Diagnosis not present

## 2022-10-26 DIAGNOSIS — N2581 Secondary hyperparathyroidism of renal origin: Secondary | ICD-10-CM | POA: Diagnosis not present

## 2022-10-26 DIAGNOSIS — D689 Coagulation defect, unspecified: Secondary | ICD-10-CM | POA: Diagnosis not present

## 2022-10-26 DIAGNOSIS — N186 End stage renal disease: Secondary | ICD-10-CM | POA: Diagnosis not present

## 2022-10-26 DIAGNOSIS — D631 Anemia in chronic kidney disease: Secondary | ICD-10-CM | POA: Diagnosis not present

## 2022-10-26 DIAGNOSIS — Z992 Dependence on renal dialysis: Secondary | ICD-10-CM | POA: Diagnosis not present

## 2022-10-26 DIAGNOSIS — E1129 Type 2 diabetes mellitus with other diabetic kidney complication: Secondary | ICD-10-CM | POA: Diagnosis not present

## 2022-10-28 DIAGNOSIS — D689 Coagulation defect, unspecified: Secondary | ICD-10-CM | POA: Diagnosis not present

## 2022-10-28 DIAGNOSIS — N2581 Secondary hyperparathyroidism of renal origin: Secondary | ICD-10-CM | POA: Diagnosis not present

## 2022-10-28 DIAGNOSIS — N186 End stage renal disease: Secondary | ICD-10-CM | POA: Diagnosis not present

## 2022-10-28 DIAGNOSIS — D631 Anemia in chronic kidney disease: Secondary | ICD-10-CM | POA: Diagnosis not present

## 2022-10-28 DIAGNOSIS — E1129 Type 2 diabetes mellitus with other diabetic kidney complication: Secondary | ICD-10-CM | POA: Diagnosis not present

## 2022-10-28 DIAGNOSIS — Z992 Dependence on renal dialysis: Secondary | ICD-10-CM | POA: Diagnosis not present

## 2022-10-30 DIAGNOSIS — N2581 Secondary hyperparathyroidism of renal origin: Secondary | ICD-10-CM | POA: Diagnosis not present

## 2022-10-30 DIAGNOSIS — D631 Anemia in chronic kidney disease: Secondary | ICD-10-CM | POA: Diagnosis not present

## 2022-10-30 DIAGNOSIS — N186 End stage renal disease: Secondary | ICD-10-CM | POA: Diagnosis not present

## 2022-10-30 DIAGNOSIS — Z992 Dependence on renal dialysis: Secondary | ICD-10-CM | POA: Diagnosis not present

## 2022-10-30 DIAGNOSIS — D689 Coagulation defect, unspecified: Secondary | ICD-10-CM | POA: Diagnosis not present

## 2022-10-30 DIAGNOSIS — E1129 Type 2 diabetes mellitus with other diabetic kidney complication: Secondary | ICD-10-CM | POA: Diagnosis not present

## 2022-11-02 DIAGNOSIS — E1129 Type 2 diabetes mellitus with other diabetic kidney complication: Secondary | ICD-10-CM | POA: Diagnosis not present

## 2022-11-02 DIAGNOSIS — D631 Anemia in chronic kidney disease: Secondary | ICD-10-CM | POA: Diagnosis not present

## 2022-11-02 DIAGNOSIS — Z992 Dependence on renal dialysis: Secondary | ICD-10-CM | POA: Diagnosis not present

## 2022-11-02 DIAGNOSIS — N186 End stage renal disease: Secondary | ICD-10-CM | POA: Diagnosis not present

## 2022-11-02 DIAGNOSIS — D689 Coagulation defect, unspecified: Secondary | ICD-10-CM | POA: Diagnosis not present

## 2022-11-02 DIAGNOSIS — N2581 Secondary hyperparathyroidism of renal origin: Secondary | ICD-10-CM | POA: Diagnosis not present

## 2022-11-04 DIAGNOSIS — D689 Coagulation defect, unspecified: Secondary | ICD-10-CM | POA: Diagnosis not present

## 2022-11-04 DIAGNOSIS — E1129 Type 2 diabetes mellitus with other diabetic kidney complication: Secondary | ICD-10-CM | POA: Diagnosis not present

## 2022-11-04 DIAGNOSIS — Z992 Dependence on renal dialysis: Secondary | ICD-10-CM | POA: Diagnosis not present

## 2022-11-04 DIAGNOSIS — D631 Anemia in chronic kidney disease: Secondary | ICD-10-CM | POA: Diagnosis not present

## 2022-11-04 DIAGNOSIS — N2581 Secondary hyperparathyroidism of renal origin: Secondary | ICD-10-CM | POA: Diagnosis not present

## 2022-11-04 DIAGNOSIS — N186 End stage renal disease: Secondary | ICD-10-CM | POA: Diagnosis not present

## 2022-11-06 DIAGNOSIS — E1129 Type 2 diabetes mellitus with other diabetic kidney complication: Secondary | ICD-10-CM | POA: Diagnosis not present

## 2022-11-06 DIAGNOSIS — N2581 Secondary hyperparathyroidism of renal origin: Secondary | ICD-10-CM | POA: Diagnosis not present

## 2022-11-06 DIAGNOSIS — D689 Coagulation defect, unspecified: Secondary | ICD-10-CM | POA: Diagnosis not present

## 2022-11-06 DIAGNOSIS — N186 End stage renal disease: Secondary | ICD-10-CM | POA: Diagnosis not present

## 2022-11-06 DIAGNOSIS — Z992 Dependence on renal dialysis: Secondary | ICD-10-CM | POA: Diagnosis not present

## 2022-11-06 DIAGNOSIS — D631 Anemia in chronic kidney disease: Secondary | ICD-10-CM | POA: Diagnosis not present

## 2022-11-09 ENCOUNTER — Other Ambulatory Visit: Payer: Self-pay | Admitting: Internal Medicine

## 2022-11-09 DIAGNOSIS — N2581 Secondary hyperparathyroidism of renal origin: Secondary | ICD-10-CM | POA: Diagnosis not present

## 2022-11-09 DIAGNOSIS — E1122 Type 2 diabetes mellitus with diabetic chronic kidney disease: Secondary | ICD-10-CM | POA: Diagnosis not present

## 2022-11-09 DIAGNOSIS — D631 Anemia in chronic kidney disease: Secondary | ICD-10-CM | POA: Diagnosis not present

## 2022-11-09 DIAGNOSIS — Z992 Dependence on renal dialysis: Secondary | ICD-10-CM | POA: Diagnosis not present

## 2022-11-09 DIAGNOSIS — N186 End stage renal disease: Secondary | ICD-10-CM | POA: Diagnosis not present

## 2022-11-09 DIAGNOSIS — D689 Coagulation defect, unspecified: Secondary | ICD-10-CM | POA: Diagnosis not present

## 2022-11-09 DIAGNOSIS — E1129 Type 2 diabetes mellitus with other diabetic kidney complication: Secondary | ICD-10-CM | POA: Diagnosis not present

## 2022-11-09 MED ORDER — PIOGLITAZONE HCL 15 MG PO TABS: 15.0000 mg | ORAL_TABLET | Freq: Every day | ORAL | 1 refills | Status: DC

## 2022-11-11 DIAGNOSIS — N186 End stage renal disease: Secondary | ICD-10-CM | POA: Diagnosis not present

## 2022-11-11 DIAGNOSIS — D631 Anemia in chronic kidney disease: Secondary | ICD-10-CM | POA: Diagnosis not present

## 2022-11-11 DIAGNOSIS — E1129 Type 2 diabetes mellitus with other diabetic kidney complication: Secondary | ICD-10-CM | POA: Diagnosis not present

## 2022-11-11 DIAGNOSIS — N2581 Secondary hyperparathyroidism of renal origin: Secondary | ICD-10-CM | POA: Diagnosis not present

## 2022-11-11 DIAGNOSIS — D689 Coagulation defect, unspecified: Secondary | ICD-10-CM | POA: Diagnosis not present

## 2022-11-11 DIAGNOSIS — Z992 Dependence on renal dialysis: Secondary | ICD-10-CM | POA: Diagnosis not present

## 2022-11-13 DIAGNOSIS — E1129 Type 2 diabetes mellitus with other diabetic kidney complication: Secondary | ICD-10-CM | POA: Diagnosis not present

## 2022-11-13 DIAGNOSIS — D631 Anemia in chronic kidney disease: Secondary | ICD-10-CM | POA: Diagnosis not present

## 2022-11-13 DIAGNOSIS — D689 Coagulation defect, unspecified: Secondary | ICD-10-CM | POA: Diagnosis not present

## 2022-11-13 DIAGNOSIS — N186 End stage renal disease: Secondary | ICD-10-CM | POA: Diagnosis not present

## 2022-11-13 DIAGNOSIS — N2581 Secondary hyperparathyroidism of renal origin: Secondary | ICD-10-CM | POA: Diagnosis not present

## 2022-11-13 DIAGNOSIS — Z992 Dependence on renal dialysis: Secondary | ICD-10-CM | POA: Diagnosis not present

## 2022-11-16 DIAGNOSIS — N2581 Secondary hyperparathyroidism of renal origin: Secondary | ICD-10-CM | POA: Diagnosis not present

## 2022-11-16 DIAGNOSIS — N186 End stage renal disease: Secondary | ICD-10-CM | POA: Diagnosis not present

## 2022-11-16 DIAGNOSIS — D689 Coagulation defect, unspecified: Secondary | ICD-10-CM | POA: Diagnosis not present

## 2022-11-16 DIAGNOSIS — Z992 Dependence on renal dialysis: Secondary | ICD-10-CM | POA: Diagnosis not present

## 2022-11-16 DIAGNOSIS — E1129 Type 2 diabetes mellitus with other diabetic kidney complication: Secondary | ICD-10-CM | POA: Diagnosis not present

## 2022-11-16 DIAGNOSIS — D631 Anemia in chronic kidney disease: Secondary | ICD-10-CM | POA: Diagnosis not present

## 2022-11-17 ENCOUNTER — Ambulatory Visit: Payer: Medicare Other | Admitting: Internal Medicine

## 2022-11-18 DIAGNOSIS — N186 End stage renal disease: Secondary | ICD-10-CM | POA: Diagnosis not present

## 2022-11-18 DIAGNOSIS — D689 Coagulation defect, unspecified: Secondary | ICD-10-CM | POA: Diagnosis not present

## 2022-11-18 DIAGNOSIS — D631 Anemia in chronic kidney disease: Secondary | ICD-10-CM | POA: Diagnosis not present

## 2022-11-18 DIAGNOSIS — E1129 Type 2 diabetes mellitus with other diabetic kidney complication: Secondary | ICD-10-CM | POA: Diagnosis not present

## 2022-11-18 DIAGNOSIS — Z992 Dependence on renal dialysis: Secondary | ICD-10-CM | POA: Diagnosis not present

## 2022-11-18 DIAGNOSIS — N2581 Secondary hyperparathyroidism of renal origin: Secondary | ICD-10-CM | POA: Diagnosis not present

## 2022-11-20 DIAGNOSIS — N2581 Secondary hyperparathyroidism of renal origin: Secondary | ICD-10-CM | POA: Diagnosis not present

## 2022-11-20 DIAGNOSIS — Z992 Dependence on renal dialysis: Secondary | ICD-10-CM | POA: Diagnosis not present

## 2022-11-20 DIAGNOSIS — D689 Coagulation defect, unspecified: Secondary | ICD-10-CM | POA: Diagnosis not present

## 2022-11-20 DIAGNOSIS — N186 End stage renal disease: Secondary | ICD-10-CM | POA: Diagnosis not present

## 2022-11-20 DIAGNOSIS — D631 Anemia in chronic kidney disease: Secondary | ICD-10-CM | POA: Diagnosis not present

## 2022-11-20 DIAGNOSIS — E1129 Type 2 diabetes mellitus with other diabetic kidney complication: Secondary | ICD-10-CM | POA: Diagnosis not present

## 2022-11-23 DIAGNOSIS — N186 End stage renal disease: Secondary | ICD-10-CM | POA: Diagnosis not present

## 2022-11-23 DIAGNOSIS — E1129 Type 2 diabetes mellitus with other diabetic kidney complication: Secondary | ICD-10-CM | POA: Diagnosis not present

## 2022-11-23 DIAGNOSIS — D631 Anemia in chronic kidney disease: Secondary | ICD-10-CM | POA: Diagnosis not present

## 2022-11-23 DIAGNOSIS — N2581 Secondary hyperparathyroidism of renal origin: Secondary | ICD-10-CM | POA: Diagnosis not present

## 2022-11-23 DIAGNOSIS — D689 Coagulation defect, unspecified: Secondary | ICD-10-CM | POA: Diagnosis not present

## 2022-11-23 DIAGNOSIS — Z992 Dependence on renal dialysis: Secondary | ICD-10-CM | POA: Diagnosis not present

## 2022-11-24 ENCOUNTER — Encounter: Payer: Self-pay | Admitting: Vascular Surgery

## 2022-11-24 ENCOUNTER — Ambulatory Visit: Payer: Medicare Other | Admitting: Vascular Surgery

## 2022-11-24 VITALS — BP 151/79 | HR 98 | Temp 98.6°F | Resp 20 | Ht 72.0 in | Wt 175.0 lb

## 2022-11-24 DIAGNOSIS — N186 End stage renal disease: Secondary | ICD-10-CM

## 2022-11-24 NOTE — Progress Notes (Signed)
Patient ID: DEREX GARFIAS, male   DOB: Jun 06, 1961, 62 y.o.   MRN: [id number]  Reason for Consult: Follow-up   Referred by Darnell Level, MD  Subjective:     HPI:  Christopher Lopez is a 62 y.o. male with end-stage renal disease on dialysis Tuesdays, Thursdays and Saturdays via left arm AV graft which has had some thinning skin.  4 years ago he underwent conversion to the graft with stenting of the left axillary vein with Viabahn stent.  He is now having difficulty cannulation and also has a scab overlying the graft.  Patient does not take any blood thinners not having any pain in the arm. He has not had any bleeding issues with dialysis.  He has never had any right upper extremity access.  Past Medical History:  Diagnosis Date   Alcohol abuse 09/18/2011   Allergic rhinitis, cause unspecified 09/20/2011   Anemia, unspecified 09/18/2011   Arthritis    Childhood asthma 09/20/2011   CKD (chronic kidney disease) stage 5, GFR less than 15 ml/min (HCC)    Dialysis - T/Th/Sa   Colitis 09/18/2011   Dementia (HCC) 09/18/2011   Diabetes mellitus    Diverticulosis of colon without hemorrhage 02/27/2014   Foot ulcer (HCC) 09/18/2011   High cholesterol    HTN (hypertension) 09/18/2011   Hyperlipidemia 11/08/2011   Hypertension    Impaired glucose tolerance 09/18/2011   Pneumonia 09/18/2011   Pre-ulcerative corn or callous 11/20/2011   Stroke (HCC)    left leg deficit   Type II or unspecified type diabetes mellitus without mention of complication, uncontrolled 09/20/2011   Wears glasses    Family History  Problem Relation Age of Onset   Prostate cancer Paternal Grandfather    Diabetes Mother    Heart disease Mother    Transient ischemic attack Mother    Diabetes Sister    Diabetes Maternal Grandfather    Colon cancer Neg Hx    Past Surgical History:  Procedure Laterality Date   AV FISTULA PLACEMENT Left 12/23/2017   Procedure: CREATION OF BASILIAC - CEPHALIC  ARTERIOVENOUS (AV) FISTULA  STAGE ONE;   Surgeon: Maeola Harman, MD;  Location: [facility] OR;  Service: Vascular;  Laterality: Left;   AV FISTULA PLACEMENT Left 07/26/2018   Procedure: INSERTION OF ARTERIOVENOUS (AV) GORE-TEX GRAFT ARM;  Surgeon: Maeola Harman, MD;  Location: [facility];  Service: Vascular;  Laterality: Left;   BASCILIC VEIN TRANSPOSITION Left 03/01/2018   Procedure: BASILIC VEIN TRANSPOSITION SECOND STAGE;  Surgeon: Maeola Harman, MD;  Location: [facility] OR;  Service: Vascular;  Laterality: Left;   INSERTION OF DIALYSIS CATHETER Right 12/23/2017   Procedure: INSERTION OF PALINDROME  DIALYSIS CATHETER;  Surgeon: Maeola Harman, MD;  Location: [facility] OR;  Service: Vascular;  Laterality: Right;   THROMBECTOMY BRACHIAL ARTERY Left 07/26/2018   Procedure: THROMBECTOMY LEFT UPPER EXTREMITY;  Surgeon: Maeola Harman, MD;  Location: [facility] OR;  Service: Vascular;  Laterality: Left;   TONSILLECTOMY  1985   VEIN REPAIR Left 07/26/2018   Procedure: STENT LEFT AXILLARY VEIN;  Surgeon: Maeola Harman, MD;  Location: [facility] OR;  Service: Vascular;  Laterality: Left;    Short Social History:  Social History   Tobacco Use   Smoking status: Former    Packs/day: .5    Types: Cigarettes   Smokeless tobacco: Never  Substance Use Topics   Alcohol use: Not Currently    Alcohol/week: 2.0 standard drinks of alcohol    Types:  2 Cans of beer per week    Allergies  Allergen Reactions   Lipitor [Atorvastatin] Other (See Comments)    Current Outpatient Medications  Medication Sig Dispense Refill   acetaminophen (TYLENOL) 650 MG CR tablet Take by mouth.     aspirin 81 MG EC tablet Take by mouth.     cyanocobalamin 1000 MCG tablet Take by mouth.     diltiazem (CARDIZEM LA) 360 MG 24 hr tablet Take by mouth.     diphenhydrAMINE (BENADRYL) 25 mg capsule Take by mouth.     Exenatide ER (BYDUREON BCISE) 2 MG/0.85ML AUIJ Inject 0.85 mLs into the skin once a week. 10.2 mL 1   ferric citrate (AURYXIA)  1 GM 210 MG(Fe) tablet Take by mouth.     glucose blood (ONE TOUCH ULTRA TEST) test strip Use as instructed once daily E11.9 100 each 12   hydrALAZINE (APRESOLINE) 100 MG tablet Take by mouth.     ketoconazole (NIZORAL) 2 % cream Apply topically.     Lancets (ONETOUCH ULTRASOFT) lancets 1 each by Other route 2 (two) times daily. Use to check blood sugars once a day Dx e11.9 100 each 3   lovastatin (MEVACOR) 40 MG tablet Take 1 tablet (40 mg total) by mouth at bedtime. 90 tablet 2   metoprolol succinate (TOPROL-XL) 50 MG 24 hr tablet Take 1 tablet (50 mg total) by mouth daily. Take with or immediately following a meal. 90 tablet 3   Multiple Vitamin (MULTIVITAMIN ADULT PO) Take by mouth.     oxyCODONE-acetaminophen (PERCOCET) 5-325 MG tablet Take 1 tablet by mouth every 6 (six) hours as needed for severe pain. 20 tablet 0   pioglitazone (ACTOS) 15 MG tablet Take 1 tablet (15 mg total) by mouth daily. 90 tablet 1   rosuvastatin (CRESTOR) 20 MG tablet Take 1 tablet (20 mg total) by mouth daily. 90 tablet 3   sitaGLIPtin (JANUVIA) 100 MG tablet Take by mouth.     No current facility-administered medications for this visit.    Review of Systems  Constitutional:  Constitutional negative. HENT: HENT negative.  Eyes: Eyes negative.  Respiratory: Respiratory negative.  Cardiovascular: Cardiovascular negative.  GI: Gastrointestinal negative.  Musculoskeletal: Musculoskeletal negative.  Neurological: Neurological negative. Hematologic: Hematologic/lymphatic negative.  Psychiatric: Psychiatric negative.        Objective:  Objective   Vitals:   11/24/22 1048  BP: (!) 151/79  Pulse: 98  Resp: 20  Temp: 98.6 F (37 C)  SpO2: 99%  Weight: 175 lb (79.4 kg)  Height: 6' (1.829 m)   Body mass index is 23.73 kg/m.  Physical Exam HENT:     Head: Normocephalic.     Mouth/Throat:     Mouth: Mucous membranes are moist.  Eyes:     Pupils: Pupils are equal, round, and reactive to light.   Cardiovascular:     Pulses:          Radial pulses are 2+ on the right side and 2+ on the left side.  Abdominal:     General: Abdomen is flat.  Musculoskeletal:     Comments: Left upper arm AV fistula 2 areas of pseudoaneurysmal degeneration with thin skin although able to pinch the skin over the graft.  There appears to be thrombus in the pseudoaneurysms and the graft has pulsatility.  Skin:    General: Skin is warm.     Capillary Refill: Capillary refill takes less than 2 seconds.  Neurological:     General: No focal deficit  present.     Mental Status: He is alert.     Data: No studies today     Assessment/Plan:     62 year old male with end-stage renal disease on dialysis via left arm AV graft that has a stent in the axillary vein with pulsatility on today's exam.  They are having difficulty cannulating this is certainly understandable given the 2 areas of pseudoaneurysmal degeneration where there is also likely thrombus.  We discussed the options including transitioning to right arm access versus fistulogram of the left upper extremity and then possibly placing a new graft there if he has central patency.  Will perform fistulogram on a nondialysis day in the near future for further planning of left arm access.  If we are to stay on the left arm he will likely require catheter: Graft revision he demonstrates good understanding of this in the presence of his mother today.     Maeola Harman MD [facility]

## 2022-11-24 NOTE — H&P (View-Only) (Signed)
 Patient ID: Christopher Lopez, male   DOB: 09/28/1960, 62 y.o.   MRN: [id number]  Reason for Consult: Follow-up   Referred by Peeples, Samuel J, MD  Subjective:     HPI:  Christopher Lopez is a 62 y.o. male with end-stage renal disease on dialysis Tuesdays, Thursdays and Saturdays via left arm AV graft which has had some thinning skin.  4 years ago he underwent conversion to the graft with stenting of the left axillary vein with Viabahn stent.  He is now having difficulty cannulation and also has a scab overlying the graft.  Patient does not take any blood thinners not having any pain in the arm. He has not had any bleeding issues with dialysis.  He has never had any right upper extremity access.  Past Medical History:  Diagnosis Date   Alcohol abuse 09/18/2011   Allergic rhinitis, cause unspecified 09/20/2011   Anemia, unspecified 09/18/2011   Arthritis    Childhood asthma 09/20/2011   CKD (chronic kidney disease) stage 5, GFR less than 15 ml/min (HCC)    Dialysis - T/Th/Sa   Colitis 09/18/2011   Dementia (HCC) 09/18/2011   Diabetes mellitus    Diverticulosis of colon without hemorrhage 02/27/2014   Foot ulcer (HCC) 09/18/2011   High cholesterol    HTN (hypertension) 09/18/2011   Hyperlipidemia 11/08/2011   Hypertension    Impaired glucose tolerance 09/18/2011   Pneumonia 09/18/2011   Pre-ulcerative corn or callous 11/20/2011   Stroke (HCC)    left leg deficit   Type II or unspecified type diabetes mellitus without mention of complication, uncontrolled 09/20/2011   Wears glasses    Family History  Problem Relation Age of Onset   Prostate cancer Paternal Grandfather    Diabetes Mother    Heart disease Mother    Transient ischemic attack Mother    Diabetes Sister    Diabetes Maternal Grandfather    Colon cancer Neg Hx    Past Surgical History:  Procedure Laterality Date   AV FISTULA PLACEMENT Left 12/23/2017   Procedure: CREATION OF BASILIAC - CEPHALIC  ARTERIOVENOUS (AV) FISTULA  STAGE ONE;   Surgeon: Daouda Lonzo Christopher, MD;  Location: [facility] OR;  Service: Vascular;  Laterality: Left;   AV FISTULA PLACEMENT Left 07/26/2018   Procedure: INSERTION OF ARTERIOVENOUS (AV) GORE-TEX GRAFT ARM;  Surgeon: Taydon Nasworthy Christopher, MD;  Location: [facility] OR;  Service: Vascular;  Laterality: Left;   BASCILIC VEIN TRANSPOSITION Left 03/01/2018   Procedure: BASILIC VEIN TRANSPOSITION SECOND STAGE;  Surgeon: Blanche Scovell Christopher, MD;  Location: [facility] OR;  Service: Vascular;  Laterality: Left;   INSERTION OF DIALYSIS CATHETER Right 12/23/2017   Procedure: INSERTION OF PALINDROME  DIALYSIS CATHETER;  Surgeon: Kayd Launer Christopher, MD;  Location: [facility] OR;  Service: Vascular;  Laterality: Right;   THROMBECTOMY BRACHIAL ARTERY Left 07/26/2018   Procedure: THROMBECTOMY LEFT UPPER EXTREMITY;  Surgeon: Yug Loria Christopher, MD;  Location: [facility] OR;  Service: Vascular;  Laterality: Left;   TONSILLECTOMY  1985   VEIN REPAIR Left 07/26/2018   Procedure: STENT LEFT AXILLARY VEIN;  Surgeon: Artemis Koller Christopher, MD;  Location: [facility] OR;  Service: Vascular;  Laterality: Left;    Short Social History:  Social History   Tobacco Use   Smoking status: Former    Packs/day: .5    Types: Cigarettes   Smokeless tobacco: Never  Substance Use Topics   Alcohol use: Not Currently    Alcohol/week: 2.0 standard drinks of alcohol    Types:   2 Cans of beer per week    Allergies  Allergen Reactions   Lipitor [Atorvastatin] Other (See Comments)    Current Outpatient Medications  Medication Sig Dispense Refill   acetaminophen (TYLENOL) 650 MG CR tablet Take by mouth.     aspirin 81 MG EC tablet Take by mouth.     cyanocobalamin 1000 MCG tablet Take by mouth.     diltiazem (CARDIZEM LA) 360 MG 24 hr tablet Take by mouth.     diphenhydrAMINE (BENADRYL) 25 mg capsule Take by mouth.     Exenatide ER (BYDUREON BCISE) 2 MG/0.85ML AUIJ Inject 0.85 mLs into the skin once a week. 10.2 mL 1   ferric citrate (AURYXIA)  1 GM 210 MG(Fe) tablet Take by mouth.     glucose blood (ONE TOUCH ULTRA TEST) test strip Use as instructed once daily E11.9 100 each 12   hydrALAZINE (APRESOLINE) 100 MG tablet Take by mouth.     ketoconazole (NIZORAL) 2 % cream Apply topically.     Lancets (ONETOUCH ULTRASOFT) lancets 1 each by Other route 2 (two) times daily. Use to check blood sugars once a day Dx e11.9 100 each 3   lovastatin (MEVACOR) 40 MG tablet Take 1 tablet (40 mg total) by mouth at bedtime. 90 tablet 2   metoprolol succinate (TOPROL-XL) 50 MG 24 hr tablet Take 1 tablet (50 mg total) by mouth daily. Take with or immediately following a meal. 90 tablet 3   Multiple Vitamin (MULTIVITAMIN ADULT PO) Take by mouth.     oxyCODONE-acetaminophen (PERCOCET) 5-325 MG tablet Take 1 tablet by mouth every 6 (six) hours as needed for severe pain. 20 tablet 0   pioglitazone (ACTOS) 15 MG tablet Take 1 tablet (15 mg total) by mouth daily. 90 tablet 1   rosuvastatin (CRESTOR) 20 MG tablet Take 1 tablet (20 mg total) by mouth daily. 90 tablet 3   sitaGLIPtin (JANUVIA) 100 MG tablet Take by mouth.     No current facility-administered medications for this visit.    Review of Systems  Constitutional:  Constitutional negative. HENT: HENT negative.  Eyes: Eyes negative.  Respiratory: Respiratory negative.  Cardiovascular: Cardiovascular negative.  GI: Gastrointestinal negative.  Musculoskeletal: Musculoskeletal negative.  Neurological: Neurological negative. Hematologic: Hematologic/lymphatic negative.  Psychiatric: Psychiatric negative.        Objective:  Objective   Vitals:   11/24/22 1048  BP: (!) 151/79  Pulse: 98  Resp: 20  Temp: 98.6 F (37 C)  SpO2: 99%  Weight: 175 lb (79.4 kg)  Height: 6' (1.829 m)   Body mass index is 23.73 kg/m.  Physical Exam HENT:     Head: Normocephalic.     Mouth/Throat:     Mouth: Mucous membranes are moist.  Eyes:     Pupils: Pupils are equal, round, and reactive to light.   Cardiovascular:     Pulses:          Radial pulses are 2+ on the right side and 2+ on the left side.  Abdominal:     General: Abdomen is flat.  Musculoskeletal:     Comments: Left upper arm AV fistula 2 areas of pseudoaneurysmal degeneration with thin skin although able to pinch the skin over the graft.  There appears to be thrombus in the pseudoaneurysms and the graft has pulsatility.  Skin:    General: Skin is warm.     Capillary Refill: Capillary refill takes less than 2 seconds.  Neurological:     General: No focal deficit   present.     Mental Status: He is alert.     Data: No studies today     Assessment/Plan:     62-year-old male with end-stage renal disease on dialysis via left arm AV graft that has a stent in the axillary vein with pulsatility on today's exam.  They are having difficulty cannulating this is certainly understandable given the 2 areas of pseudoaneurysmal degeneration where there is also likely thrombus.  We discussed the options including transitioning to right arm access versus fistulogram of the left upper extremity and then possibly placing a new graft there if he has central patency.  Will perform fistulogram on a nondialysis day in the near future for further planning of left arm access.  If we are to stay on the left arm he will likely require catheter: Graft revision he demonstrates good understanding of this in the presence of his mother today.     Amelia Macken Christopher Kendria Halberg MD [facility] [place]   

## 2022-11-25 DIAGNOSIS — N2581 Secondary hyperparathyroidism of renal origin: Secondary | ICD-10-CM | POA: Diagnosis not present

## 2022-11-25 DIAGNOSIS — D689 Coagulation defect, unspecified: Secondary | ICD-10-CM | POA: Diagnosis not present

## 2022-11-25 DIAGNOSIS — D631 Anemia in chronic kidney disease: Secondary | ICD-10-CM | POA: Diagnosis not present

## 2022-11-25 DIAGNOSIS — Z992 Dependence on renal dialysis: Secondary | ICD-10-CM | POA: Diagnosis not present

## 2022-11-25 DIAGNOSIS — E1129 Type 2 diabetes mellitus with other diabetic kidney complication: Secondary | ICD-10-CM | POA: Diagnosis not present

## 2022-11-25 DIAGNOSIS — N186 End stage renal disease: Secondary | ICD-10-CM | POA: Diagnosis not present

## 2022-11-27 DIAGNOSIS — E1129 Type 2 diabetes mellitus with other diabetic kidney complication: Secondary | ICD-10-CM | POA: Diagnosis not present

## 2022-11-27 DIAGNOSIS — N2581 Secondary hyperparathyroidism of renal origin: Secondary | ICD-10-CM | POA: Diagnosis not present

## 2022-11-27 DIAGNOSIS — D631 Anemia in chronic kidney disease: Secondary | ICD-10-CM | POA: Diagnosis not present

## 2022-11-27 DIAGNOSIS — Z992 Dependence on renal dialysis: Secondary | ICD-10-CM | POA: Diagnosis not present

## 2022-11-27 DIAGNOSIS — N186 End stage renal disease: Secondary | ICD-10-CM | POA: Diagnosis not present

## 2022-11-27 DIAGNOSIS — D689 Coagulation defect, unspecified: Secondary | ICD-10-CM | POA: Diagnosis not present

## 2022-11-29 ENCOUNTER — Encounter: Payer: Self-pay | Admitting: Internal Medicine

## 2022-11-29 ENCOUNTER — Other Ambulatory Visit: Payer: Self-pay

## 2022-11-29 ENCOUNTER — Ambulatory Visit (INDEPENDENT_AMBULATORY_CARE_PROVIDER_SITE_OTHER): Payer: Medicare Other | Admitting: Internal Medicine

## 2022-11-29 VITALS — BP 136/82 | HR 100 | Temp 98.9°F | Ht 72.0 in | Wt 181.0 lb

## 2022-11-29 DIAGNOSIS — Z0001 Encounter for general adult medical examination with abnormal findings: Secondary | ICD-10-CM | POA: Diagnosis not present

## 2022-11-29 DIAGNOSIS — H6123 Impacted cerumen, bilateral: Secondary | ICD-10-CM | POA: Diagnosis not present

## 2022-11-29 DIAGNOSIS — I10 Essential (primary) hypertension: Secondary | ICD-10-CM | POA: Diagnosis not present

## 2022-11-29 DIAGNOSIS — E782 Mixed hyperlipidemia: Secondary | ICD-10-CM | POA: Diagnosis not present

## 2022-11-29 DIAGNOSIS — E1129 Type 2 diabetes mellitus with other diabetic kidney complication: Secondary | ICD-10-CM | POA: Diagnosis not present

## 2022-11-29 DIAGNOSIS — N186 End stage renal disease: Secondary | ICD-10-CM

## 2022-11-29 LAB — POCT GLYCOSYLATED HEMOGLOBIN (HGB A1C): Hemoglobin A1C: 6.4 % — AB (ref 4.0–5.6)

## 2022-11-29 MED ORDER — SODIUM CHLORIDE 0.9 % IV SOLN: 250.0000 mL | INTRAVENOUS | Status: DC | PRN

## 2022-11-29 MED ORDER — SODIUM CHLORIDE 0.9% FLUSH
3.0000 mL | Freq: Two times a day (BID) | INTRAVENOUS | Status: DC
Start: 2022-11-29 — End: 2023-11-02

## 2022-11-29 NOTE — Progress Notes (Signed)
Patient ID: Christopher Lopez, male   DOB: 1960/07/30, 62 y.o.   MRN: [id number]         Chief Complaint:: wellness exam and dm, ESRD on HD, bilateral wax impactions and reduced hearing, htn       HPI:  Christopher Lopez is a 62 y.o. male here for wellness exam; for shingrix and tdap at the pharmacy, o/w up to date                        Also Pt denies chest pain, increased sob or doe, wheezing, orthopnea, PND, increased LE swelling, palpitations, dizziness or syncope.   Pt denies polydipsia, polyuria, or new focal neuro s/s.    Pt denies fever, wt loss, night sweats, loss of appetite, or other constitutional symptoms  Does have reduced hearing and bilateral ear wax for several wks today.  Has frequent lab testing at dialysis, does not want lab today   Wt Readings from Last 3 Encounters:  11/29/22 181 lb (82.1 kg)  11/24/22 175 lb (79.4 kg)  10/14/22 182 lb (82.6 kg)   BP Readings from Last 3 Encounters:  11/29/22 136/82  11/24/22 (!) 151/79  04/14/22 128/74   Immunization History  Administered Date(s) Administered   Hepatitis B, ADULT 01/10/2018, 02/11/2018, 03/14/2018, 07/10/2018   Hepb-cpg 05/29/2022   Influenza,inj,Quad PF,6+ Mos 02/27/2014, 08/22/2015, 09/06/2018, 03/09/2019, 05/06/2020, 04/01/2021, 04/21/2021, 04/14/2022   Moderna Sars-Covid-2 Vaccination 09/20/2019, 10/25/2019   Pneumococcal Conjugate-13 09/06/2018, 10/31/2018   Pneumococcal Polysaccharide-23 08/20/2014, 08/22/2015   Tdap 09/05/2012   Health Maintenance Due  Topic Date Due   Zoster Vaccines- Shingrix (1 of 2) Never done   DTaP/Tdap/Td (2 - Td or Tdap) 09/05/2022   HEMOGLOBIN A1C  10/14/2022      Past Medical History:  Diagnosis Date   Alcohol abuse 09/18/2011   Allergic rhinitis, cause unspecified 09/20/2011   Anemia, unspecified 09/18/2011   Arthritis    Childhood asthma 09/20/2011   CKD (chronic kidney disease) stage 5, GFR less than 15 ml/min (HCC)    Dialysis - T/Th/Sa   Colitis 09/18/2011   Dementia (HCC)  09/18/2011   Diabetes mellitus    Diverticulosis of colon without hemorrhage 02/27/2014   Foot ulcer (HCC) 09/18/2011   High cholesterol    HTN (hypertension) 09/18/2011   Hyperlipidemia 11/08/2011   Hypertension    Impaired glucose tolerance 09/18/2011   Pneumonia 09/18/2011   Pre-ulcerative corn or callous 11/20/2011   Stroke (HCC)    left leg deficit   Type II or unspecified type diabetes mellitus without mention of complication, uncontrolled 09/20/2011   Wears glasses    Past Surgical History:  Procedure Laterality Date   AV FISTULA PLACEMENT Left 12/23/2017   Procedure: CREATION OF BASILIAC - CEPHALIC  ARTERIOVENOUS (AV) FISTULA  STAGE ONE;  Surgeon: Maeola Harman, MD;  Location: [facility] OR;  Service: Vascular;  Laterality: Left;   AV FISTULA PLACEMENT Left 07/26/2018   Procedure: INSERTION OF ARTERIOVENOUS (AV) GORE-TEX GRAFT ARM;  Surgeon: Maeola Harman, MD;  Location: [facility] OR;  Service: Vascular;  Laterality: Left;   BASCILIC VEIN TRANSPOSITION Left 03/01/2018   Procedure: BASILIC VEIN TRANSPOSITION SECOND STAGE;  Surgeon: Maeola Harman, MD;  Location: [facility] OR;  Service: Vascular;  Laterality: Left;   INSERTION OF DIALYSIS CATHETER Right 12/23/2017   Procedure: INSERTION OF PALINDROME  DIALYSIS CATHETER;  Surgeon: Maeola Harman, MD;  Location: [facility] OR;  Service: Vascular;  Laterality: Right;   THROMBECTOMY BRACHIAL  ARTERY Left 07/26/2018   Procedure: THROMBECTOMY LEFT UPPER EXTREMITY;  Surgeon: Maeola Harman, MD;  Location: [facility] OR;  Service: Vascular;  Laterality: Left;   TONSILLECTOMY  1985   VEIN REPAIR Left 07/26/2018   Procedure: STENT LEFT AXILLARY VEIN;  Surgeon: Maeola Harman, MD;  Location: [facility] OR;  Service: Vascular;  Laterality: Left;    reports that he has quit smoking. His smoking use included cigarettes. He smoked an average of .5 packs per day. He has never used smokeless tobacco. He reports that he does not currently use  alcohol after a past usage of about 2.0 standard drinks of alcohol per week. He reports that he does not use drugs. family history includes Diabetes in his maternal grandfather, mother, and sister; Heart disease in his mother; Prostate cancer in his paternal grandfather; Transient ischemic attack in his mother. Allergies  Allergen Reactions   Lipitor [Atorvastatin] Other (See Comments)   Current Outpatient Medications on File Prior to Visit  Medication Sig Dispense Refill   acetaminophen (TYLENOL) 650 MG CR tablet Take by mouth.     aspirin 81 MG EC tablet Take by mouth.     cyanocobalamin 1000 MCG tablet Take by mouth.     diltiazem (CARDIZEM LA) 360 MG 24 hr tablet Take by mouth.     diphenhydrAMINE (BENADRYL) 25 mg capsule Take by mouth.     Exenatide ER (BYDUREON BCISE) 2 MG/0.85ML AUIJ Inject 0.85 mLs into the skin once a week. 10.2 mL 1   ferric citrate (AURYXIA) 1 GM 210 MG(Fe) tablet Take by mouth.     glucose blood (ONE TOUCH ULTRA TEST) test strip Use as instructed once daily E11.9 100 each 12   hydrALAZINE (APRESOLINE) 100 MG tablet Take by mouth.     ketoconazole (NIZORAL) 2 % cream Apply topically.     Lancets (ONETOUCH ULTRASOFT) lancets 1 each by Other route 2 (two) times daily. Use to check blood sugars once a day Dx e11.9 100 each 3   lovastatin (MEVACOR) 40 MG tablet Take 1 tablet (40 mg total) by mouth at bedtime. 90 tablet 2   metoprolol succinate (TOPROL-XL) 50 MG 24 hr tablet Take 1 tablet (50 mg total) by mouth daily. Take with or immediately following a meal. 90 tablet 3   Multiple Vitamin (MULTIVITAMIN ADULT PO) Take by mouth.     oxyCODONE-acetaminophen (PERCOCET) 5-325 MG tablet Take 1 tablet by mouth every 6 (six) hours as needed for severe pain. 20 tablet 0   pioglitazone (ACTOS) 15 MG tablet Take 1 tablet (15 mg total) by mouth daily. 90 tablet 1   rosuvastatin (CRESTOR) 20 MG tablet Take 1 tablet (20 mg total) by mouth daily. 90 tablet 3   sitaGLIPtin  (JANUVIA) 100 MG tablet Take by mouth.     No current facility-administered medications on file prior to visit.        ROS:  All others reviewed and negative.  Objective        PE:  BP 136/82 (BP Location: Right Arm, Patient Position: Sitting, Cuff Size: Normal)   Pulse 100   Temp 98.9 F (37.2 C) (Oral)   Ht 6' (1.829 m)   Wt 181 lb (82.1 kg)   SpO2 99%   BMI 24.55 kg/m                 Constitutional: Pt appears in NAD               HENT: Head: NCAT.  Right Ear: External ear normal.                 Left Ear: External ear normal.                Eyes: . Pupils are equal, round, and reactive to light. Conjunctivae and EOM are normal               Nose: without d/c or deformity               Neck: Neck supple. Gross normal ROM               Cardiovascular: Normal rate and regular rhythm.                 Pulmonary/Chest: Effort normal and breath sounds without rales or wheezing.                Abd:  Soft, NT, ND, + BS, no organomegaly               Neurological: Pt is alert. At baseline orientation, motor grossly intact               Skin: Skin is warm. No rashes, no other new lesions, LE edema - none               Psychiatric: Pt behavior is normal without agitation   Micro: none  Cardiac tracings I have personally interpreted today:  none  Pertinent Radiological findings (summarize): none   Lab Results  Component Value Date   WBC 9.8 10/12/2021   HGB 11.1 (L) 10/12/2021   HCT 33.2 (L) 10/12/2021   PLT 263.0 10/12/2021   GLUCOSE 149 (H) 10/12/2021   CHOL 155 10/12/2021   TRIG 131.0 10/12/2021   HDL 34.90 (L) 10/12/2021   LDLDIRECT 131.0 08/11/2017   LDLCALC 94 10/12/2021   ALT 10 10/12/2021   AST 14 10/12/2021   NA 134 (L) 10/12/2021   K 4.6 10/12/2021   CL 93 (L) 10/12/2021   CREATININE 10.28 (HH) 10/12/2021   BUN 40 (H) 10/12/2021   CO2 29 10/12/2021   TSH 1.82 10/12/2021   PSA 0.73 10/12/2021   HGBA1C 6.3 (A) 04/14/2022   MICROALBUR 98.3 (H)  08/11/2017   POCT - A1c -  Hemoglobin A1C 4.0 - 5.6 % 6.4 Abnormal  6.3    Assessment/Plan:  Christopher Lopez is a 62 y.o. Black or African American [2] male with  has a past medical history of Alcohol abuse (09/18/2011), Allergic rhinitis, cause unspecified (09/20/2011), Anemia, unspecified (09/18/2011), Arthritis, Childhood asthma (09/20/2011), CKD (chronic kidney disease) stage 5, GFR less than 15 ml/min (HCC), Colitis (09/18/2011), Dementia (HCC) (09/18/2011), Diabetes mellitus, Diverticulosis of colon without hemorrhage (02/27/2014), Foot ulcer (HCC) (09/18/2011), High cholesterol, HTN (hypertension) (09/18/2011), Hyperlipidemia (11/08/2011), Hypertension, Impaired glucose tolerance (09/18/2011), Pneumonia (09/18/2011), Pre-ulcerative corn or callous (11/20/2011), Stroke (HCC), Type II or unspecified type diabetes mellitus without mention of complication, uncontrolled (09/20/2011), and Wears glasses.  Encounter for well adult exam with abnormal findings Age and sex appropriate education and counseling updated with regular exercise and diet Referrals for preventative services - none needed Immunizations addressed - for shingrix ant tdap at pharmacy Smoking counseling  - none needed Evidence for depression or other mood disorder - none significant Most recent labs reviewed. I have personally reviewed and have noted: 1) the patient's medical and social history 2) The patient's current medications and supplements 3) The patient's height, weight, and BMI have been recorded  in the chart   Essential (primary) hypertension BP Readings from Last 3 Encounters:  11/29/22 136/82  11/24/22 (!) 151/79  04/14/22 128/74   Stable, pt to continue medical treatment card 360 qd, toprol xl 50 qd, hydralazine 100 tid   Type 2 diabetes mellitus with other diabetic kidney complication (HCC) Lab Results  Component Value Date   HGBA1C 6.3 (A) 04/14/2022   Stable recently, for A1c today, with goal < 7, pt to continue  current medical treatment bydureon 2 mg weekly, actos 15 mg qd, januvia 100 qd    Hyperlipidemia, unspecified Lab Results  Component Value Date   LDLCALC 94 10/12/2021   Stable, pt to continue current statin crestor 20 qd   Bilateral hearing loss due to cerumen impaction Ceruminosis is noted.  Wax is removed by syringing and manual debridement. Instructions for home care to prevent wax buildup are given.  Followup: Return in about 6 months (around 06/01/2023).  Oliver Barre, MD 11/29/2022 12:10 PM [facility]

## 2022-11-29 NOTE — Assessment & Plan Note (Signed)
Age and sex appropriate education and counseling updated with regular exercise and diet Referrals for preventative services - none needed Immunizations addressed - for shingrix ant tdap at pharmacy Smoking counseling  - none needed Evidence for depression or other mood disorder - none significant Most recent labs reviewed. I have personally reviewed and have noted: 1) the patient's medical and social history 2) The patient's current medications and supplements 3) The patient's height, weight, and BMI have been recorded in the chart

## 2022-11-29 NOTE — Assessment & Plan Note (Addendum)
Lab Results  Component Value Date   HGBA1C 6.3 (A) 04/14/2022   Stable recently, for A1c today, with goal < 7, pt to continue current medical treatment bydureon 2 mg weekly, actos 15 mg qd, januvia 100 qd

## 2022-11-29 NOTE — Patient Instructions (Addendum)
Please have your Shingrix (shingles) shots done at Dialysis as you mentioned, as well as the Tdap tetanus shot  Your A1c was done today  Your ears were irrigated of wax today  Please continue all other medications as before, and refills have been done if requested.  Please have the pharmacy call with any other refills you may need.  Please continue your efforts at being more active, low cholesterol diet, and weight control.  You are otherwise up to date with prevention measures today.  Please keep your appointments with your specialists as you may have planned  Please make an Appointment to return in 6 months, or sooner if needed

## 2022-11-29 NOTE — Assessment & Plan Note (Signed)
Lab Results  Component Value Date   LDLCALC 94 10/12/2021   Stable, pt to continue current statin crestor 20 qd

## 2022-11-29 NOTE — Assessment & Plan Note (Signed)
Ceruminosis is noted.  Wax is removed by syringing and manual debridement. Instructions for home care to prevent wax buildup are given.  

## 2022-11-29 NOTE — Assessment & Plan Note (Signed)
BP Readings from Last 3 Encounters:  11/29/22 136/82  11/24/22 (!) 151/79  04/14/22 128/74   Stable, pt to continue medical treatment card 360 qd, toprol xl 50 qd, hydralazine 100 tid

## 2022-11-30 DIAGNOSIS — D631 Anemia in chronic kidney disease: Secondary | ICD-10-CM | POA: Diagnosis not present

## 2022-11-30 DIAGNOSIS — Z992 Dependence on renal dialysis: Secondary | ICD-10-CM | POA: Diagnosis not present

## 2022-11-30 DIAGNOSIS — E1129 Type 2 diabetes mellitus with other diabetic kidney complication: Secondary | ICD-10-CM | POA: Diagnosis not present

## 2022-11-30 DIAGNOSIS — N186 End stage renal disease: Secondary | ICD-10-CM | POA: Diagnosis not present

## 2022-11-30 DIAGNOSIS — N2581 Secondary hyperparathyroidism of renal origin: Secondary | ICD-10-CM | POA: Diagnosis not present

## 2022-11-30 DIAGNOSIS — D689 Coagulation defect, unspecified: Secondary | ICD-10-CM | POA: Diagnosis not present

## 2022-12-02 DIAGNOSIS — D631 Anemia in chronic kidney disease: Secondary | ICD-10-CM | POA: Diagnosis not present

## 2022-12-02 DIAGNOSIS — D689 Coagulation defect, unspecified: Secondary | ICD-10-CM | POA: Diagnosis not present

## 2022-12-02 DIAGNOSIS — N186 End stage renal disease: Secondary | ICD-10-CM | POA: Diagnosis not present

## 2022-12-02 DIAGNOSIS — E1129 Type 2 diabetes mellitus with other diabetic kidney complication: Secondary | ICD-10-CM | POA: Diagnosis not present

## 2022-12-02 DIAGNOSIS — Z992 Dependence on renal dialysis: Secondary | ICD-10-CM | POA: Diagnosis not present

## 2022-12-02 DIAGNOSIS — N2581 Secondary hyperparathyroidism of renal origin: Secondary | ICD-10-CM | POA: Diagnosis not present

## 2022-12-04 DIAGNOSIS — E1129 Type 2 diabetes mellitus with other diabetic kidney complication: Secondary | ICD-10-CM | POA: Diagnosis not present

## 2022-12-04 DIAGNOSIS — D631 Anemia in chronic kidney disease: Secondary | ICD-10-CM | POA: Diagnosis not present

## 2022-12-04 DIAGNOSIS — N186 End stage renal disease: Secondary | ICD-10-CM | POA: Diagnosis not present

## 2022-12-04 DIAGNOSIS — N2581 Secondary hyperparathyroidism of renal origin: Secondary | ICD-10-CM | POA: Diagnosis not present

## 2022-12-04 DIAGNOSIS — D689 Coagulation defect, unspecified: Secondary | ICD-10-CM | POA: Diagnosis not present

## 2022-12-04 DIAGNOSIS — Z992 Dependence on renal dialysis: Secondary | ICD-10-CM | POA: Diagnosis not present

## 2022-12-07 DIAGNOSIS — E1129 Type 2 diabetes mellitus with other diabetic kidney complication: Secondary | ICD-10-CM | POA: Diagnosis not present

## 2022-12-07 DIAGNOSIS — D689 Coagulation defect, unspecified: Secondary | ICD-10-CM | POA: Diagnosis not present

## 2022-12-07 DIAGNOSIS — N186 End stage renal disease: Secondary | ICD-10-CM | POA: Diagnosis not present

## 2022-12-07 DIAGNOSIS — N2581 Secondary hyperparathyroidism of renal origin: Secondary | ICD-10-CM | POA: Diagnosis not present

## 2022-12-07 DIAGNOSIS — Z992 Dependence on renal dialysis: Secondary | ICD-10-CM | POA: Diagnosis not present

## 2022-12-07 DIAGNOSIS — D631 Anemia in chronic kidney disease: Secondary | ICD-10-CM | POA: Diagnosis not present

## 2022-12-09 DIAGNOSIS — E1129 Type 2 diabetes mellitus with other diabetic kidney complication: Secondary | ICD-10-CM | POA: Diagnosis not present

## 2022-12-09 DIAGNOSIS — N186 End stage renal disease: Secondary | ICD-10-CM | POA: Diagnosis not present

## 2022-12-09 DIAGNOSIS — D631 Anemia in chronic kidney disease: Secondary | ICD-10-CM | POA: Diagnosis not present

## 2022-12-09 DIAGNOSIS — N2581 Secondary hyperparathyroidism of renal origin: Secondary | ICD-10-CM | POA: Diagnosis not present

## 2022-12-09 DIAGNOSIS — Z992 Dependence on renal dialysis: Secondary | ICD-10-CM | POA: Diagnosis not present

## 2022-12-09 DIAGNOSIS — D689 Coagulation defect, unspecified: Secondary | ICD-10-CM | POA: Diagnosis not present

## 2022-12-10 DIAGNOSIS — N186 End stage renal disease: Secondary | ICD-10-CM | POA: Diagnosis not present

## 2022-12-10 DIAGNOSIS — E1122 Type 2 diabetes mellitus with diabetic chronic kidney disease: Secondary | ICD-10-CM | POA: Diagnosis not present

## 2022-12-10 DIAGNOSIS — Z992 Dependence on renal dialysis: Secondary | ICD-10-CM | POA: Diagnosis not present

## 2022-12-11 DIAGNOSIS — D631 Anemia in chronic kidney disease: Secondary | ICD-10-CM | POA: Diagnosis not present

## 2022-12-11 DIAGNOSIS — D689 Coagulation defect, unspecified: Secondary | ICD-10-CM | POA: Diagnosis not present

## 2022-12-11 DIAGNOSIS — Z992 Dependence on renal dialysis: Secondary | ICD-10-CM | POA: Diagnosis not present

## 2022-12-11 DIAGNOSIS — E1129 Type 2 diabetes mellitus with other diabetic kidney complication: Secondary | ICD-10-CM | POA: Diagnosis not present

## 2022-12-11 DIAGNOSIS — N2581 Secondary hyperparathyroidism of renal origin: Secondary | ICD-10-CM | POA: Diagnosis not present

## 2022-12-11 DIAGNOSIS — N186 End stage renal disease: Secondary | ICD-10-CM | POA: Diagnosis not present

## 2022-12-13 ENCOUNTER — Ambulatory Visit (HOSPITAL_COMMUNITY)
Admission: RE | Admit: 2022-12-13 | Discharge: 2022-12-13 | Disposition: A | Discharge status: Home / Self Care | Payer: Medicare Other | Attending: Vascular Surgery | Admitting: Vascular Surgery

## 2022-12-13 ENCOUNTER — Encounter (HOSPITAL_COMMUNITY)
Admission: RE | Disposition: A | Discharge status: Home / Self Care | Payer: Self-pay | Source: Home / Self Care | Attending: Vascular Surgery

## 2022-12-13 DIAGNOSIS — Z87891 Personal history of nicotine dependence: Secondary | ICD-10-CM | POA: Insufficient documentation

## 2022-12-13 DIAGNOSIS — Z992 Dependence on renal dialysis: Secondary | ICD-10-CM | POA: Insufficient documentation

## 2022-12-13 DIAGNOSIS — Z8249 Family history of ischemic heart disease and other diseases of the circulatory system: Secondary | ICD-10-CM | POA: Diagnosis not present

## 2022-12-13 DIAGNOSIS — N186 End stage renal disease: Secondary | ICD-10-CM | POA: Insufficient documentation

## 2022-12-13 DIAGNOSIS — Y832 Surgical operation with anastomosis, bypass or graft as the cause of abnormal reaction of the patient, or of later complication, without mention of misadventure at the time of the procedure: Secondary | ICD-10-CM | POA: Insufficient documentation

## 2022-12-13 DIAGNOSIS — T82858A Stenosis of vascular prosthetic devices, implants and grafts, initial encounter: Secondary | ICD-10-CM | POA: Insufficient documentation

## 2022-12-13 DIAGNOSIS — I120 Hypertensive chronic kidney disease with stage 5 chronic kidney disease or end stage renal disease: Secondary | ICD-10-CM | POA: Insufficient documentation

## 2022-12-13 DIAGNOSIS — Z833 Family history of diabetes mellitus: Secondary | ICD-10-CM | POA: Diagnosis not present

## 2022-12-13 DIAGNOSIS — T82898A Other specified complication of vascular prosthetic devices, implants and grafts, initial encounter: Secondary | ICD-10-CM

## 2022-12-13 DIAGNOSIS — E1122 Type 2 diabetes mellitus with diabetic chronic kidney disease: Secondary | ICD-10-CM | POA: Insufficient documentation

## 2022-12-13 HISTORY — PX: PERIPHERAL VASCULAR BALLOON ANGIOPLASTY: CATH118281

## 2022-12-13 HISTORY — PX: A/V FISTULAGRAM: CATH118298

## 2022-12-13 LAB — GLUCOSE, CAPILLARY
Glucose-Capillary: 90 mg/dL (ref 70–99)
Glucose-Capillary: 78 mg/dL (ref 70–99)

## 2022-12-13 LAB — POCT I-STAT, CHEM 8
BUN: 20 mg/dL (ref 8–23)
Calcium, Ion: 1.18 mmol/L (ref 1.15–1.40)
Chloride: 93 mmol/L — ABNORMAL LOW (ref 98–111)
Creatinine, Ser: 10.4 mg/dL — ABNORMAL HIGH (ref 0.61–1.24)
Glucose, Bld: 91 mg/dL (ref 70–99)
HCT: 34 % — ABNORMAL LOW (ref 39.0–52.0)
Hemoglobin: 11.6 g/dL — ABNORMAL LOW (ref 13.0–17.0)
Potassium: 5 mmol/L (ref 3.5–5.1)
Sodium: 134 mmol/L — ABNORMAL LOW (ref 135–145)
TCO2: 34 mmol/L — ABNORMAL HIGH (ref 22–32)

## 2022-12-13 SURGERY — A/V FISTULAGRAM
Anesthesia: LOCAL | Laterality: Left

## 2022-12-13 MED ORDER — MIDAZOLAM HCL 2 MG/2ML IJ SOLN
INTRAMUSCULAR | Status: DC | PRN
  Administered 2022-12-13: 1 mg via INTRAVENOUS

## 2022-12-13 MED ORDER — FENTANYL CITRATE (PF) 100 MCG/2ML IJ SOLN
INTRAMUSCULAR | Status: DC | PRN
  Administered 2022-12-13: 50 ug via INTRAVENOUS

## 2022-12-13 MED ORDER — HEPARIN (PORCINE) IN NACL 1000-0.9 UT/500ML-% IV SOLN
INTRAVENOUS | Status: DC | PRN
  Administered 2022-12-13: 500 mL

## 2022-12-13 MED ORDER — IODIXANOL 320 MG/ML IV SOLN
INTRAVENOUS | Status: DC | PRN
  Administered 2022-12-13: 50 mL

## 2022-12-13 MED ORDER — LIDOCAINE HCL (PF) 1 % IJ SOLN
INTRAMUSCULAR | Status: AC
  Filled 2022-12-13: qty 30

## 2022-12-13 MED ORDER — LIDOCAINE HCL (PF) 1 % IJ SOLN
INTRAMUSCULAR | Status: DC | PRN
  Administered 2022-12-13: 10 mL

## 2022-12-13 MED ORDER — SODIUM CHLORIDE 0.9% FLUSH: 3.0000 mL | INTRAVENOUS | Status: DC | PRN

## 2022-12-13 MED ORDER — MIDAZOLAM HCL 2 MG/2ML IJ SOLN
INTRAMUSCULAR | Status: AC
  Filled 2022-12-13: qty 2

## 2022-12-13 MED ORDER — FENTANYL CITRATE (PF) 100 MCG/2ML IJ SOLN
INTRAMUSCULAR | Status: AC
  Filled 2022-12-13: qty 2

## 2022-12-13 SURGICAL SUPPLY — 18 items
BAG SNAP BAND KOVER 36X36 (MISCELLANEOUS) ×3 IMPLANT
BALLN LUTONIX AV 10X60X75 (BALLOONS) ×2
BALLN MUSTANG 10.0X40 75 (BALLOONS) ×2
BALLN MUSTANG 8.0X40 75 (BALLOONS) ×2
BALLOON LUTONIX AV 10X60X75 (BALLOONS) IMPLANT
BALLOON MUSTANG 10.0X40 75 (BALLOONS) IMPLANT
BALLOON MUSTANG 8.0X40 75 (BALLOONS) IMPLANT
COVER DOME SNAP 22 D (MISCELLANEOUS) ×3 IMPLANT
KIT ENCORE 26 ADVANTAGE (KITS) IMPLANT
KIT MICROPUNCTURE NIT STIFF (SHEATH) IMPLANT
PROTECTION STATION PRESSURIZED (MISCELLANEOUS) ×2
SHEATH PINNACLE R/O II 7F 4CM (SHEATH) IMPLANT
SHEATH PROBE COVER 6X72 (BAG) ×3 IMPLANT
STATION PROTECTION PRESSURIZED (MISCELLANEOUS) ×3 IMPLANT
STOPCOCK MORSE 400PSI 3WAY (MISCELLANEOUS) ×3 IMPLANT
TRAY PV CATH (CUSTOM PROCEDURE TRAY) ×3 IMPLANT
TUBING CIL FLEX 10 FLL-RA (TUBING) ×3 IMPLANT
WIRE BENTSON .035X145CM (WIRE) IMPLANT

## 2022-12-13 NOTE — Op Note (Signed)
    Patient name: Christopher Lopez MRN: [id number] DOB: March 04, 1961 Sex: male  12/13/2022 Pre-operative Diagnosis: End-stage renal disease, malfunction left arm AV graft Post-operative diagnosis:  Same Surgeon:  Luanna Salk. Randie Heinz, MD Procedure Performed: 1.  Ultrasound-guided cannulation left arm AV graft 2.  Left upper extremity fistulogram 3.  Drug-coated balloon angioplasty of left subclavian vein with 10 mm Lutonix and plain balloon angioplasty of in-stent restenosis left axillary vein with 8 mm balloon 4.  Moderate sedation with fentanyl and Versed for 24 minutes  Indications: 62 year old male with history of end-stage renal disease on dialysis via left arm AV graft which has had difficulty with cannulation and pulsatility on physical exam with 2 areas of pseudoaneurysmal degeneration.  Initially fistula that was converted to graft and is stenting of the outflow patient indicated for fistulogram with possible intervention.  Findings: The graft itself has 2 very large areas of pseudoaneurysm however was patent throughout initially was very pulsatile there was greater than 60% stenosis in all 4 covered stent and 60% stenosis at the level of the thoracic outlet.  Both of these were ballooned to less than 20% and the thoracic outlet was ballooned with a drug-coated balloon.  Plan will be to follow-up with patient in 2 to 3 weeks to monitor progression of AV graft function and will also obtain right upper extremity vein mapping at that time to consider right arm AV access.   Procedure:  The patient was identified in the holding area and taken to room 8.  The patient was then placed supine on the table and prepped and draped in the usual sterile fashion.  A time out was called.  Ultrasound was used to evaluate the left arm AV graft and this is noted to be patent.  The area was anesthetized with 1% lidocaine and cannulated with a micropuncture needle followed by wire and a micropuncture sheath.   Concomitantly we administered fentanyl and Versed as moderate sedation and his vital signs were monitored by bedside nursing throughout the case.  Extremity fistulogram was performed with the above findings we then placed a Bentson wire followed by a 7 [place] sheath.  We first performed balloon angioplasty of the subclavian vein axillary vein in-stent restenosis with 8 mm balloon and perform retrograde angiography with the balloon inflated and the stent which demonstrated patency of the arterial anastomosis.  Completion angiography demonstrated resolution of the stenosis and the stent there was still stenosis in the subclavian vein which was removed 10 mm balloon and then drug-coated balloon.  Completion demonstrated left.  Satisfied with this we remove the wire and the sheath was removed and the cannulation site was suture-ligated with 4.  Patient tolerated procedure without any complication.  Contrast: 50cc  Jimya Ciani C. Randie Heinz, MD [facility] Office: [phone] Pager: [phone]

## 2022-12-13 NOTE — Interval H&P Note (Signed)
History and Physical Interval Note:  12/13/2022 11:29 AM  Christopher Lopez  has presented today for surgery, with the diagnosis of end stage renal disease.  The various methods of treatment have been discussed with the patient and family. After consideration of risks, benefits and other options for treatment, the patient has consented to  Procedure(s): A/V Fistulagram (Left) as a surgical intervention.  The patient's history has been reviewed, patient examined, no change in status, stable for surgery.  I have reviewed the patient's chart and labs.  Questions were answered to the patient's satisfaction.     Lemar Livings

## 2022-12-14 ENCOUNTER — Encounter (HOSPITAL_COMMUNITY): Payer: Self-pay | Admitting: Vascular Surgery

## 2022-12-14 DIAGNOSIS — E1129 Type 2 diabetes mellitus with other diabetic kidney complication: Secondary | ICD-10-CM | POA: Diagnosis not present

## 2022-12-14 DIAGNOSIS — N186 End stage renal disease: Secondary | ICD-10-CM | POA: Diagnosis not present

## 2022-12-14 DIAGNOSIS — Z992 Dependence on renal dialysis: Secondary | ICD-10-CM | POA: Diagnosis not present

## 2022-12-14 DIAGNOSIS — D631 Anemia in chronic kidney disease: Secondary | ICD-10-CM | POA: Diagnosis not present

## 2022-12-14 DIAGNOSIS — D689 Coagulation defect, unspecified: Secondary | ICD-10-CM | POA: Diagnosis not present

## 2022-12-14 DIAGNOSIS — N2581 Secondary hyperparathyroidism of renal origin: Secondary | ICD-10-CM | POA: Diagnosis not present

## 2022-12-15 ENCOUNTER — Ambulatory Visit: Payer: Medicare Other | Admitting: Podiatry

## 2022-12-16 DIAGNOSIS — D631 Anemia in chronic kidney disease: Secondary | ICD-10-CM | POA: Diagnosis not present

## 2022-12-16 DIAGNOSIS — D689 Coagulation defect, unspecified: Secondary | ICD-10-CM | POA: Diagnosis not present

## 2022-12-16 DIAGNOSIS — N2581 Secondary hyperparathyroidism of renal origin: Secondary | ICD-10-CM | POA: Diagnosis not present

## 2022-12-16 DIAGNOSIS — E1129 Type 2 diabetes mellitus with other diabetic kidney complication: Secondary | ICD-10-CM | POA: Diagnosis not present

## 2022-12-16 DIAGNOSIS — N186 End stage renal disease: Secondary | ICD-10-CM | POA: Diagnosis not present

## 2022-12-16 DIAGNOSIS — Z992 Dependence on renal dialysis: Secondary | ICD-10-CM | POA: Diagnosis not present

## 2022-12-18 DIAGNOSIS — D631 Anemia in chronic kidney disease: Secondary | ICD-10-CM | POA: Diagnosis not present

## 2022-12-18 DIAGNOSIS — N186 End stage renal disease: Secondary | ICD-10-CM | POA: Diagnosis not present

## 2022-12-18 DIAGNOSIS — Z992 Dependence on renal dialysis: Secondary | ICD-10-CM | POA: Diagnosis not present

## 2022-12-18 DIAGNOSIS — E1129 Type 2 diabetes mellitus with other diabetic kidney complication: Secondary | ICD-10-CM | POA: Diagnosis not present

## 2022-12-18 DIAGNOSIS — D689 Coagulation defect, unspecified: Secondary | ICD-10-CM | POA: Diagnosis not present

## 2022-12-18 DIAGNOSIS — N2581 Secondary hyperparathyroidism of renal origin: Secondary | ICD-10-CM | POA: Diagnosis not present

## 2022-12-21 DIAGNOSIS — E1129 Type 2 diabetes mellitus with other diabetic kidney complication: Secondary | ICD-10-CM | POA: Diagnosis not present

## 2022-12-21 DIAGNOSIS — N186 End stage renal disease: Secondary | ICD-10-CM | POA: Diagnosis not present

## 2022-12-21 DIAGNOSIS — D689 Coagulation defect, unspecified: Secondary | ICD-10-CM | POA: Diagnosis not present

## 2022-12-21 DIAGNOSIS — D631 Anemia in chronic kidney disease: Secondary | ICD-10-CM | POA: Diagnosis not present

## 2022-12-21 DIAGNOSIS — Z992 Dependence on renal dialysis: Secondary | ICD-10-CM | POA: Diagnosis not present

## 2022-12-21 DIAGNOSIS — N2581 Secondary hyperparathyroidism of renal origin: Secondary | ICD-10-CM | POA: Diagnosis not present

## 2022-12-23 DIAGNOSIS — N186 End stage renal disease: Secondary | ICD-10-CM | POA: Diagnosis not present

## 2022-12-23 DIAGNOSIS — E1129 Type 2 diabetes mellitus with other diabetic kidney complication: Secondary | ICD-10-CM | POA: Diagnosis not present

## 2022-12-23 DIAGNOSIS — D689 Coagulation defect, unspecified: Secondary | ICD-10-CM | POA: Diagnosis not present

## 2022-12-23 DIAGNOSIS — D631 Anemia in chronic kidney disease: Secondary | ICD-10-CM | POA: Diagnosis not present

## 2022-12-23 DIAGNOSIS — N2581 Secondary hyperparathyroidism of renal origin: Secondary | ICD-10-CM | POA: Diagnosis not present

## 2022-12-23 DIAGNOSIS — Z992 Dependence on renal dialysis: Secondary | ICD-10-CM | POA: Diagnosis not present

## 2022-12-25 DIAGNOSIS — Z992 Dependence on renal dialysis: Secondary | ICD-10-CM | POA: Diagnosis not present

## 2022-12-25 DIAGNOSIS — D631 Anemia in chronic kidney disease: Secondary | ICD-10-CM | POA: Diagnosis not present

## 2022-12-25 DIAGNOSIS — D689 Coagulation defect, unspecified: Secondary | ICD-10-CM | POA: Diagnosis not present

## 2022-12-25 DIAGNOSIS — E1129 Type 2 diabetes mellitus with other diabetic kidney complication: Secondary | ICD-10-CM | POA: Diagnosis not present

## 2022-12-25 DIAGNOSIS — N2581 Secondary hyperparathyroidism of renal origin: Secondary | ICD-10-CM | POA: Diagnosis not present

## 2022-12-25 DIAGNOSIS — N186 End stage renal disease: Secondary | ICD-10-CM | POA: Diagnosis not present

## 2022-12-27 ENCOUNTER — Ambulatory Visit: Payer: Medicare Other | Admitting: Podiatry

## 2022-12-27 ENCOUNTER — Encounter: Payer: Self-pay | Admitting: Podiatry

## 2022-12-27 DIAGNOSIS — M79676 Pain in unspecified toe(s): Secondary | ICD-10-CM | POA: Diagnosis not present

## 2022-12-27 DIAGNOSIS — Q828 Other specified congenital malformations of skin: Secondary | ICD-10-CM

## 2022-12-27 DIAGNOSIS — B351 Tinea unguium: Secondary | ICD-10-CM

## 2022-12-27 DIAGNOSIS — E1142 Type 2 diabetes mellitus with diabetic polyneuropathy: Secondary | ICD-10-CM | POA: Diagnosis not present

## 2022-12-27 NOTE — Progress Notes (Signed)
This patient returns to my office for at risk foot care.  This patient requires this care by a professional since this patient will be at risk due to having  ESRD, CKD and diabetes.  This patient is unable to cut nails himself since the patient cannot reach his nails.These nails are painful walking and wearing shoes.  This patient presents for at risk foot care today.  General Appearance  Alert, conversant and in no acute stress.  Vascular  Dorsalis pedis and posterior tibial  pulses are palpable  bilaterally.  Capillary return is within normal limits  bilaterally. Temperature is within normal limits  bilaterally.  Neurologic  Senn-Weinstein monofilament wire test diminished   bilaterally. Muscle power within normal limits bilaterally.  Nails Thick disfigured discolored nails with subungual debris  from hallux to fifth toes bilaterally. No evidence of bacterial infection or drainage bilaterally.  Orthopedic  No limitations of motion  feet .  No crepitus or effusions noted.  No bony pathology or digital deformities noted.  Skin  normotropic skin  noted bilaterally.  No signs of infections or ulcers noted.   Porokeratosis sub 4  B/L.   symptomatic.  Onychomycosis  Pain in right toes  Pain in left toes  Porokeratosis sub 4 left foot.  Consent was obtained for treatment procedures.   Mechanical debridement of nails 1-5  bilaterally performed with a nail nipper.  Filed with dremel without incident.  Debride porokeratosis with # 15 blade and dremel tool.   Return office visit   9 weeks                  Told patient to return for periodic foot care and evaluation due to potential at risk complications.   Chrisette Man DPM  

## 2022-12-28 DIAGNOSIS — N2581 Secondary hyperparathyroidism of renal origin: Secondary | ICD-10-CM | POA: Diagnosis not present

## 2022-12-28 DIAGNOSIS — D631 Anemia in chronic kidney disease: Secondary | ICD-10-CM | POA: Diagnosis not present

## 2022-12-28 DIAGNOSIS — E1129 Type 2 diabetes mellitus with other diabetic kidney complication: Secondary | ICD-10-CM | POA: Diagnosis not present

## 2022-12-28 DIAGNOSIS — N186 End stage renal disease: Secondary | ICD-10-CM | POA: Diagnosis not present

## 2022-12-28 DIAGNOSIS — D689 Coagulation defect, unspecified: Secondary | ICD-10-CM | POA: Diagnosis not present

## 2022-12-28 DIAGNOSIS — Z992 Dependence on renal dialysis: Secondary | ICD-10-CM | POA: Diagnosis not present

## 2022-12-30 DIAGNOSIS — N2581 Secondary hyperparathyroidism of renal origin: Secondary | ICD-10-CM | POA: Diagnosis not present

## 2022-12-30 DIAGNOSIS — E1129 Type 2 diabetes mellitus with other diabetic kidney complication: Secondary | ICD-10-CM | POA: Diagnosis not present

## 2022-12-30 DIAGNOSIS — D631 Anemia in chronic kidney disease: Secondary | ICD-10-CM | POA: Diagnosis not present

## 2022-12-30 DIAGNOSIS — Z992 Dependence on renal dialysis: Secondary | ICD-10-CM | POA: Diagnosis not present

## 2022-12-30 DIAGNOSIS — D689 Coagulation defect, unspecified: Secondary | ICD-10-CM | POA: Diagnosis not present

## 2022-12-30 DIAGNOSIS — N186 End stage renal disease: Secondary | ICD-10-CM | POA: Diagnosis not present

## 2022-12-31 ENCOUNTER — Other Ambulatory Visit: Payer: Self-pay | Admitting: *Deleted

## 2022-12-31 DIAGNOSIS — N186 End stage renal disease: Secondary | ICD-10-CM

## 2023-01-01 DIAGNOSIS — Z992 Dependence on renal dialysis: Secondary | ICD-10-CM | POA: Diagnosis not present

## 2023-01-01 DIAGNOSIS — E1129 Type 2 diabetes mellitus with other diabetic kidney complication: Secondary | ICD-10-CM | POA: Diagnosis not present

## 2023-01-01 DIAGNOSIS — N186 End stage renal disease: Secondary | ICD-10-CM | POA: Diagnosis not present

## 2023-01-01 DIAGNOSIS — D631 Anemia in chronic kidney disease: Secondary | ICD-10-CM | POA: Diagnosis not present

## 2023-01-01 DIAGNOSIS — D689 Coagulation defect, unspecified: Secondary | ICD-10-CM | POA: Diagnosis not present

## 2023-01-01 DIAGNOSIS — N2581 Secondary hyperparathyroidism of renal origin: Secondary | ICD-10-CM | POA: Diagnosis not present

## 2023-01-04 DIAGNOSIS — N186 End stage renal disease: Secondary | ICD-10-CM | POA: Diagnosis not present

## 2023-01-04 DIAGNOSIS — D631 Anemia in chronic kidney disease: Secondary | ICD-10-CM | POA: Diagnosis not present

## 2023-01-04 DIAGNOSIS — N2581 Secondary hyperparathyroidism of renal origin: Secondary | ICD-10-CM | POA: Diagnosis not present

## 2023-01-04 DIAGNOSIS — E1129 Type 2 diabetes mellitus with other diabetic kidney complication: Secondary | ICD-10-CM | POA: Diagnosis not present

## 2023-01-04 DIAGNOSIS — D689 Coagulation defect, unspecified: Secondary | ICD-10-CM | POA: Diagnosis not present

## 2023-01-04 DIAGNOSIS — Z992 Dependence on renal dialysis: Secondary | ICD-10-CM | POA: Diagnosis not present

## 2023-01-06 DIAGNOSIS — N2581 Secondary hyperparathyroidism of renal origin: Secondary | ICD-10-CM | POA: Diagnosis not present

## 2023-01-06 DIAGNOSIS — Z992 Dependence on renal dialysis: Secondary | ICD-10-CM | POA: Diagnosis not present

## 2023-01-06 DIAGNOSIS — D689 Coagulation defect, unspecified: Secondary | ICD-10-CM | POA: Diagnosis not present

## 2023-01-06 DIAGNOSIS — N186 End stage renal disease: Secondary | ICD-10-CM | POA: Diagnosis not present

## 2023-01-06 DIAGNOSIS — E1129 Type 2 diabetes mellitus with other diabetic kidney complication: Secondary | ICD-10-CM | POA: Diagnosis not present

## 2023-01-06 DIAGNOSIS — D631 Anemia in chronic kidney disease: Secondary | ICD-10-CM | POA: Diagnosis not present

## 2023-01-08 ENCOUNTER — Other Ambulatory Visit: Payer: Self-pay | Admitting: Internal Medicine

## 2023-01-08 DIAGNOSIS — D689 Coagulation defect, unspecified: Secondary | ICD-10-CM | POA: Diagnosis not present

## 2023-01-08 DIAGNOSIS — D631 Anemia in chronic kidney disease: Secondary | ICD-10-CM | POA: Diagnosis not present

## 2023-01-08 DIAGNOSIS — N2581 Secondary hyperparathyroidism of renal origin: Secondary | ICD-10-CM | POA: Diagnosis not present

## 2023-01-08 DIAGNOSIS — E1129 Type 2 diabetes mellitus with other diabetic kidney complication: Secondary | ICD-10-CM | POA: Diagnosis not present

## 2023-01-08 DIAGNOSIS — Z992 Dependence on renal dialysis: Secondary | ICD-10-CM | POA: Diagnosis not present

## 2023-01-08 DIAGNOSIS — N186 End stage renal disease: Secondary | ICD-10-CM | POA: Diagnosis not present

## 2023-01-09 DIAGNOSIS — N186 End stage renal disease: Secondary | ICD-10-CM | POA: Diagnosis not present

## 2023-01-09 DIAGNOSIS — E1122 Type 2 diabetes mellitus with diabetic chronic kidney disease: Secondary | ICD-10-CM | POA: Diagnosis not present

## 2023-01-09 DIAGNOSIS — Z992 Dependence on renal dialysis: Secondary | ICD-10-CM | POA: Diagnosis not present

## 2023-01-11 DIAGNOSIS — E1129 Type 2 diabetes mellitus with other diabetic kidney complication: Secondary | ICD-10-CM | POA: Diagnosis not present

## 2023-01-11 DIAGNOSIS — S51802S Unspecified open wound of left forearm, sequela: Secondary | ICD-10-CM | POA: Diagnosis not present

## 2023-01-11 DIAGNOSIS — N186 End stage renal disease: Secondary | ICD-10-CM | POA: Diagnosis not present

## 2023-01-11 DIAGNOSIS — D631 Anemia in chronic kidney disease: Secondary | ICD-10-CM | POA: Diagnosis not present

## 2023-01-11 DIAGNOSIS — N2581 Secondary hyperparathyroidism of renal origin: Secondary | ICD-10-CM | POA: Diagnosis not present

## 2023-01-11 DIAGNOSIS — D689 Coagulation defect, unspecified: Secondary | ICD-10-CM | POA: Diagnosis not present

## 2023-01-11 DIAGNOSIS — Z992 Dependence on renal dialysis: Secondary | ICD-10-CM | POA: Diagnosis not present

## 2023-01-12 ENCOUNTER — Ambulatory Visit (HOSPITAL_COMMUNITY)
Admission: RE | Admit: 2023-01-12 | Discharge: 2023-01-12 | Disposition: A | Discharge status: Home / Self Care | Payer: Medicare Other | Source: Ambulatory Visit | Attending: Vascular Surgery | Admitting: Vascular Surgery

## 2023-01-12 ENCOUNTER — Encounter: Payer: Self-pay | Admitting: Vascular Surgery

## 2023-01-12 ENCOUNTER — Ambulatory Visit (INDEPENDENT_AMBULATORY_CARE_PROVIDER_SITE_OTHER): Payer: Medicare Other | Admitting: Vascular Surgery

## 2023-01-12 ENCOUNTER — Ambulatory Visit (INDEPENDENT_AMBULATORY_CARE_PROVIDER_SITE_OTHER)
Admission: RE | Admit: 2023-01-12 | Discharge: 2023-01-12 | Disposition: A | Discharge status: Home / Self Care | Payer: Medicare Other | Source: Ambulatory Visit | Attending: Vascular Surgery | Admitting: Vascular Surgery

## 2023-01-12 VITALS — BP 168/83 | HR 92 | Temp 98.7°F | Resp 20 | Ht 72.0 in | Wt 169.0 lb

## 2023-01-12 DIAGNOSIS — N186 End stage renal disease: Secondary | ICD-10-CM

## 2023-01-12 NOTE — Progress Notes (Signed)
Patient ID: [name], male   DOB: 07-22-1960, 62 y.o.   MRN: [id number]  Reason for Consult: Routine Post Op   Referred by Corwin Levins, MD  Subjective:     HPI:  Christopher Lopez is a 62 y.o. male history of end-stage renal disease history of left arm AV graft recently underwent balloon angioplasty of the outflow stent stenosis.  He states that graft is working well they have easily been able to cannulate it and he is getting good clearance without excessive dialysis times.  He is not having any pain in the left upper arm.  Past Medical History:  Diagnosis Date   Alcohol abuse 09/18/2011   Allergic rhinitis, cause unspecified 09/20/2011   Anemia, unspecified 09/18/2011   Arthritis    Childhood asthma 09/20/2011   CKD (chronic kidney disease) stage 5, GFR less than 15 ml/min (HCC)    Dialysis - T/Th/Sa   Colitis 09/18/2011   Dementia (HCC) 09/18/2011   Diabetes mellitus    Diverticulosis of colon without hemorrhage 02/27/2014   Foot ulcer (HCC) 09/18/2011   High cholesterol    HTN (hypertension) 09/18/2011   Hyperlipidemia 11/08/2011   Hypertension    Impaired glucose tolerance 09/18/2011   Pneumonia 09/18/2011   Pre-ulcerative corn or callous 11/20/2011   Stroke (HCC)    left leg deficit   Type II or unspecified type diabetes mellitus without mention of complication, uncontrolled 09/20/2011   Wears glasses    Family History  Problem Relation Age of Onset   Prostate cancer Paternal Grandfather    Diabetes Mother    Heart disease Mother    Transient ischemic attack Mother    Diabetes Sister    Diabetes Maternal Grandfather    Colon cancer Neg Hx    Past Surgical History:  Procedure Laterality Date   A/V FISTULAGRAM Left 12/13/2022   Procedure: A/V Fistulagram;  Surgeon: Maeola Harman, MD;  Location: [facility] INVASIVE CV LAB;  Service: Cardiovascular;  Laterality: Left;   AV FISTULA PLACEMENT Left 12/23/2017   Procedure: CREATION OF BASILIAC - CEPHALIC  ARTERIOVENOUS (AV)  FISTULA  STAGE ONE;  Surgeon: Maeola Harman, MD;  Location: [facility] OR;  Service: Vascular;  Laterality: Left;   AV FISTULA PLACEMENT Left 07/26/2018   Procedure: INSERTION OF ARTERIOVENOUS (AV) GORE-TEX GRAFT ARM;  Surgeon: Maeola Harman, MD;  Location: [facility] OR;  Service: Vascular;  Laterality: Left;   BASCILIC VEIN TRANSPOSITION Left 03/01/2018   Procedure: BASILIC VEIN TRANSPOSITION SECOND STAGE;  Surgeon: Maeola Harman, MD;  Location: [facility] OR;  Service: Vascular;  Laterality: Left;   INSERTION OF DIALYSIS CATHETER Right 12/23/2017   Procedure: INSERTION OF PALINDROME  DIALYSIS CATHETER;  Surgeon: Maeola Harman, MD;  Location: [facility] OR;  Service: Vascular;  Laterality: Right;   PERIPHERAL VASCULAR BALLOON ANGIOPLASTY  12/13/2022   Procedure: PERIPHERAL VASCULAR BALLOON ANGIOPLASTY;  Surgeon: Maeola Harman, MD;  Location: [facility] INVASIVE CV LAB;  Service: Cardiovascular;;   THROMBECTOMY BRACHIAL ARTERY Left 07/26/2018   Procedure: THROMBECTOMY LEFT UPPER EXTREMITY;  Surgeon: Maeola Harman, MD;  Location: [facility] OR;  Service: Vascular;  Laterality: Left;   TONSILLECTOMY  1985   VEIN REPAIR Left 07/26/2018   Procedure: STENT LEFT AXILLARY VEIN;  Surgeon: Maeola Harman, MD;  Location: [facility] OR;  Service: Vascular;  Laterality: Left;    Short Social History:  Social History   Tobacco Use   Smoking status: Former    Packs/day: .5    Types:  Cigarettes   Smokeless tobacco: Never  Substance Use Topics   Alcohol use: Not Currently    Alcohol/week: 2.0 standard drinks of alcohol    Types: 2 Cans of beer per week    Allergies  Allergen Reactions   Lipitor [Atorvastatin] Other (See Comments)    Current Outpatient Medications  Medication Sig Dispense Refill   acetaminophen (TYLENOL) 650 MG CR tablet Take 1,300 mg by mouth every 8 (eight) hours as needed for pain.     aspirin 81 MG EC tablet Take by mouth.     diphenhydrAMINE (BENADRYL)  25 mg capsule Take 25 mg by mouth daily as needed for itching.     Exenatide ER (BYDUREON BCISE) 2 MG/0.85ML AUIJ Inject 0.85 mLs into the skin once a week. 10.2 mL 1   glucose blood (ONE TOUCH ULTRA TEST) test strip Use as instructed once daily E11.9 100 each 12   Lancets (ONETOUCH ULTRASOFT) lancets 1 each by Other route 2 (two) times daily. Use to check blood sugars once a day Dx e11.9 100 each 3   metoprolol succinate (TOPROL-XL) 50 MG 24 hr tablet Take 1 tablet (50 mg total) by mouth daily. Take with or immediately following a meal. 90 tablet 3   pioglitazone (ACTOS) 15 MG tablet Take 1 tablet (15 mg total) by mouth daily. 90 tablet 1   rosuvastatin (CRESTOR) 20 MG tablet Take 1 tablet (20 mg total) by mouth daily. 90 tablet 3   VELPHORO 500 MG chewable tablet Chew 500 mg by mouth 3 (three) times daily with meals.     Current Facility-Administered Medications  Medication Dose Route Frequency Provider Last Rate Last Admin   0.9 %  sodium chloride infusion  250 mL Intravenous PRN Maeola Harman, MD       sodium chloride flush (NS) 0.9 % injection 3 mL  3 mL Intravenous Q12H Maeola Harman, MD        Review of Systems  Constitutional:  Constitutional negative. HENT: HENT negative.  Eyes: Eyes negative.  Respiratory: Respiratory negative.  Cardiovascular: Cardiovascular negative.  GI: Gastrointestinal negative.  Musculoskeletal: Musculoskeletal negative.  Skin: Skin negative.  Neurological: Neurological negative. Hematologic: Hematologic/lymphatic negative.  Psychiatric: Psychiatric negative.        Objective:  Objective   Vitals:   01/12/23 0822  BP: (!) 168/83  Pulse: 92  Resp: 20  Temp: 98.7 F (37.1 C)  SpO2: 98%  Weight: 169 lb (76.7 kg)  Height: 6' (1.829 m)   Body mass index is 22.92 kg/m.  Physical Exam HENT:     Head: Normocephalic.     Nose: Nose normal.  Eyes:     Pupils: Pupils are equal, round, and reactive to light.   Cardiovascular:     Rate and Rhythm: Normal rate.  Pulmonary:     Effort: Pulmonary effort is normal.  Abdominal:     General: Abdomen is flat.  Musculoskeletal:        General: Normal range of motion.     Cervical back: Normal range of motion.     Right lower leg: No edema.     Left lower leg: No edema.     Comments: Left upper arm loss of pigment at 2 pseudoaneurysm sites  Skin:    General: Skin is warm.     Capillary Refill: Capillary refill takes less than 2 seconds.  Neurological:     General: No focal deficit present.     Mental Status: He is alert.  Psychiatric:  Mood and Affect: Mood normal.        Thought Content: Thought content normal.        Judgment: Judgment normal.     Data: +-----------------+-------------+----------+-------------------------+  Right Cephalic   Diameter (cm)Depth (cm)        Findings           +-----------------+-------------+----------+-------------------------+  Shoulder            0.14                                          +-----------------+-------------+----------+-------------------------+  Prox upper arm       0.15                                          +-----------------+-------------+----------+-------------------------+  Mid upper arm        0.13                                          +-----------------+-------------+----------+-------------------------+  Dist upper arm       0.09                                          +-----------------+-------------+----------+-------------------------+  Antecubital fossa    0.11                                          +-----------------+-------------+----------+-------------------------+  Prox forearm         0.10               possible chronic thrombus  +-----------------+-------------+----------+-------------------------+  Mid forearm                             possible chronic thrombus   +-----------------+-------------+----------+-------------------------+  Dist forearm         0.13                                          +-----------------+-------------+----------+-------------------------+   +-----------------+-------------+----------+--------+  Right Basilic    Diameter (cm)Depth (cm)Findings  +-----------------+-------------+----------+--------+  Mid upper arm        0.18                         +-----------------+-------------+----------+--------+  Dist upper arm       0.20                         +-----------------+-------------+----------+--------+  Antecubital fossa    0.19                         +-----------------+-------------+----------+--------+   Summary: Right: Possible chronic thrombus in the forearm segment of the         cephalic vein, difficult to determine due to small size.  Patent basilic vein.    Right Pre-Dialysis Findings:  +-----------------------+----------+--------------------+---------+--------  -+  Location              PSV (cm/s)Intralum. Diam. (cm)Waveform  Comments   +-----------------------+----------+--------------------+---------+--------  -+  Brachial Antecub. [id number]        0.65                 triphasiccalcified  +-----------------------+----------+--------------------+---------+--------  -+  Radial Art at Wrist    73        0.34                triphasic            +-----------------------+----------+--------------------+---------+--------  -+  Ulnar Art at Wrist     88        0.21                triphasic            +-----------------------+----------+--------------------+---------+--------  -+         Assessment/Plan:    62 year old male with end-stage renal disease recent intervention outflow stent of the left arm AV graft further to pseudoaneurysms.  He states that now the graft is working well he is not having any issues with cannulation and has not had  any excessive bleeding times.  He really does not have any fistula options in the right upper extremity for the future.  Possibly in the future if he has issues we would consider interposition AV grafting and temporary tunneled dialysis catheter.  For now he can follow-up on an as-needed basis.     Maeola Harman MD [facility]

## 2023-01-13 DIAGNOSIS — N2581 Secondary hyperparathyroidism of renal origin: Secondary | ICD-10-CM | POA: Diagnosis not present

## 2023-01-13 DIAGNOSIS — S51802S Unspecified open wound of left forearm, sequela: Secondary | ICD-10-CM | POA: Diagnosis not present

## 2023-01-13 DIAGNOSIS — D631 Anemia in chronic kidney disease: Secondary | ICD-10-CM | POA: Diagnosis not present

## 2023-01-13 DIAGNOSIS — D689 Coagulation defect, unspecified: Secondary | ICD-10-CM | POA: Diagnosis not present

## 2023-01-13 DIAGNOSIS — E1129 Type 2 diabetes mellitus with other diabetic kidney complication: Secondary | ICD-10-CM | POA: Diagnosis not present

## 2023-01-13 DIAGNOSIS — N186 End stage renal disease: Secondary | ICD-10-CM | POA: Diagnosis not present

## 2023-01-13 DIAGNOSIS — Z992 Dependence on renal dialysis: Secondary | ICD-10-CM | POA: Diagnosis not present

## 2023-01-15 DIAGNOSIS — D631 Anemia in chronic kidney disease: Secondary | ICD-10-CM | POA: Diagnosis not present

## 2023-01-15 DIAGNOSIS — Z992 Dependence on renal dialysis: Secondary | ICD-10-CM | POA: Diagnosis not present

## 2023-01-15 DIAGNOSIS — N2581 Secondary hyperparathyroidism of renal origin: Secondary | ICD-10-CM | POA: Diagnosis not present

## 2023-01-15 DIAGNOSIS — E1129 Type 2 diabetes mellitus with other diabetic kidney complication: Secondary | ICD-10-CM | POA: Diagnosis not present

## 2023-01-15 DIAGNOSIS — D689 Coagulation defect, unspecified: Secondary | ICD-10-CM | POA: Diagnosis not present

## 2023-01-15 DIAGNOSIS — S51802S Unspecified open wound of left forearm, sequela: Secondary | ICD-10-CM | POA: Diagnosis not present

## 2023-01-15 DIAGNOSIS — N186 End stage renal disease: Secondary | ICD-10-CM | POA: Diagnosis not present

## 2023-01-18 DIAGNOSIS — D631 Anemia in chronic kidney disease: Secondary | ICD-10-CM | POA: Diagnosis not present

## 2023-01-18 DIAGNOSIS — D689 Coagulation defect, unspecified: Secondary | ICD-10-CM | POA: Diagnosis not present

## 2023-01-18 DIAGNOSIS — Z992 Dependence on renal dialysis: Secondary | ICD-10-CM | POA: Diagnosis not present

## 2023-01-18 DIAGNOSIS — S51802S Unspecified open wound of left forearm, sequela: Secondary | ICD-10-CM | POA: Diagnosis not present

## 2023-01-18 DIAGNOSIS — E1129 Type 2 diabetes mellitus with other diabetic kidney complication: Secondary | ICD-10-CM | POA: Diagnosis not present

## 2023-01-18 DIAGNOSIS — N2581 Secondary hyperparathyroidism of renal origin: Secondary | ICD-10-CM | POA: Diagnosis not present

## 2023-01-18 DIAGNOSIS — N186 End stage renal disease: Secondary | ICD-10-CM | POA: Diagnosis not present

## 2023-01-20 ENCOUNTER — Telehealth: Payer: Self-pay

## 2023-01-20 DIAGNOSIS — E1129 Type 2 diabetes mellitus with other diabetic kidney complication: Secondary | ICD-10-CM | POA: Diagnosis not present

## 2023-01-20 DIAGNOSIS — N2581 Secondary hyperparathyroidism of renal origin: Secondary | ICD-10-CM | POA: Diagnosis not present

## 2023-01-20 DIAGNOSIS — Z992 Dependence on renal dialysis: Secondary | ICD-10-CM | POA: Diagnosis not present

## 2023-01-20 DIAGNOSIS — S51802S Unspecified open wound of left forearm, sequela: Secondary | ICD-10-CM | POA: Diagnosis not present

## 2023-01-20 DIAGNOSIS — D689 Coagulation defect, unspecified: Secondary | ICD-10-CM | POA: Diagnosis not present

## 2023-01-20 DIAGNOSIS — N186 End stage renal disease: Secondary | ICD-10-CM | POA: Diagnosis not present

## 2023-01-20 DIAGNOSIS — D631 Anemia in chronic kidney disease: Secondary | ICD-10-CM | POA: Diagnosis not present

## 2023-01-20 NOTE — Telephone Encounter (Signed)
Caller: [name] at [facility]  Concern: access drainage, small amount of continuous clear non-odorous drainage since HD treatment Tuesday  Pt denies any other infection symptoms, no swelling, no pain, no redness  Location: left arm  Description:  Since Tuesday  Treatments:  Instructed pt to wash, cover with dressing, change dressing daily or when soiled, continue monitoring  Resolution: Instructed to call back if symptoms perist, HD center to take cx on Saturday  Next Appt: Pt instructed to call back tomorrow if no improvement or worsening

## 2023-01-21 ENCOUNTER — Telehealth: Payer: Self-pay

## 2023-01-21 DIAGNOSIS — S51802S Unspecified open wound of left forearm, sequela: Secondary | ICD-10-CM | POA: Insufficient documentation

## 2023-01-21 NOTE — Telephone Encounter (Signed)
Pt's brother called and just wanted to f/u with MD to let him know he is aware to keep area clean and dry and monitor it for any changes. Deniece Portela states it is still clear drainage and nothing has changed as this time and he was just wanting to inform us of that. No questions/concerns at this time.

## 2023-01-22 DIAGNOSIS — N186 End stage renal disease: Secondary | ICD-10-CM | POA: Diagnosis not present

## 2023-01-22 DIAGNOSIS — Z992 Dependence on renal dialysis: Secondary | ICD-10-CM | POA: Diagnosis not present

## 2023-01-22 DIAGNOSIS — S51802S Unspecified open wound of left forearm, sequela: Secondary | ICD-10-CM | POA: Diagnosis not present

## 2023-01-22 DIAGNOSIS — D631 Anemia in chronic kidney disease: Secondary | ICD-10-CM | POA: Diagnosis not present

## 2023-01-22 DIAGNOSIS — E1129 Type 2 diabetes mellitus with other diabetic kidney complication: Secondary | ICD-10-CM | POA: Diagnosis not present

## 2023-01-22 DIAGNOSIS — D689 Coagulation defect, unspecified: Secondary | ICD-10-CM | POA: Diagnosis not present

## 2023-01-22 DIAGNOSIS — N2581 Secondary hyperparathyroidism of renal origin: Secondary | ICD-10-CM | POA: Diagnosis not present

## 2023-01-25 DIAGNOSIS — S51802S Unspecified open wound of left forearm, sequela: Secondary | ICD-10-CM | POA: Diagnosis not present

## 2023-01-25 DIAGNOSIS — N186 End stage renal disease: Secondary | ICD-10-CM | POA: Diagnosis not present

## 2023-01-25 DIAGNOSIS — N2581 Secondary hyperparathyroidism of renal origin: Secondary | ICD-10-CM | POA: Diagnosis not present

## 2023-01-25 DIAGNOSIS — D631 Anemia in chronic kidney disease: Secondary | ICD-10-CM | POA: Diagnosis not present

## 2023-01-25 DIAGNOSIS — D689 Coagulation defect, unspecified: Secondary | ICD-10-CM | POA: Diagnosis not present

## 2023-01-25 DIAGNOSIS — Z992 Dependence on renal dialysis: Secondary | ICD-10-CM | POA: Diagnosis not present

## 2023-01-25 DIAGNOSIS — E1129 Type 2 diabetes mellitus with other diabetic kidney complication: Secondary | ICD-10-CM | POA: Diagnosis not present

## 2023-01-25 NOTE — Telephone Encounter (Signed)
[name] @ [facility] called stating that the pt is still having continued drainage and should be seen. Appt sch for tomorrow, 7/17, for a triage appt with PA.

## 2023-01-25 NOTE — Telephone Encounter (Signed)
Pt's brother, Christopher Lopez, called requesting a return call.  Reviewed pt's chart, returned call for clarification, two identifiers used. He stated that the pt was still having serosanguinous drainage, no other symptoms. The pt was leaving to go to HD. The HD center had taken a cx of the drainage on Saturday. Instructed him to get the results and have them evaluate it. If he needs an appt, HD center will call. Confirmed understanding.

## 2023-01-25 NOTE — Progress Notes (Unsigned)
HISTORY AND PHYSICAL     CC:  dialysis access Requesting Provider:  Corwin Levins, MD  HPI: This is a 62 y.o. male here for evaluation of his hemodialysis access.    Dialysis access history: -left 1st stage BVT 12/23/2017 Dr. Randie Heinz -left 2nd stage BVT 03/01/2018 Dr. Randie Heinz -LUA AVG, stent removal and thrombectomy left axillary and subclavian veins, stent left axillary vein 07/26/2018 Dr. Randie Heinz -fistulogram with drug coated balloon angioplasty of left SCV and plain balloon angioplasty of in stent restenosis left axillary vein 12/13/2022  Pt was last seen by Dr. Randie Heinz on 01/12/2023 and at that time, pt stated the graft was working well and had been easy to cannulate and getting good clearance.   He was not having any pain in the left arm.  He did not have any fistula options in the RUE for the future.  Possibly in the future if he has issues we would consider interposition AV grafting and temporary tunneled dialysis catheter. He was to follow up as needed.  The pt returns today for follow up and here with his brother.  Pt tells me that he has had clear drainage from the area on the lower aneurysmal part for about a week.  He states the fluid is clear and sticky.  No purulent drainage.  He has not had any fevers.  He states it is working well since his last fistulogram last month.  He states the dialysis techs are sticking around the worrisome area.    The pt is right hand dominant.    Pt is on dialysis.   Days of dialysis if applicable:  T/T/S    HD center:  [address]  location.   The pt is on a statin for cholesterol management.  The pt is on a daily aspirin.  Other AC:  none The pt is on BB for hypertension.  The pt is  on medication for diabetes.   Tobacco hx:  former  Past Medical History:  Diagnosis Date   Alcohol abuse 09/18/2011   Allergic rhinitis, cause unspecified 09/20/2011   Anemia, unspecified 09/18/2011   Arthritis    Childhood asthma 09/20/2011   CKD (chronic kidney disease)  stage 5, GFR less than 15 ml/min (HCC)    Dialysis - T/Th/Sa   Colitis 09/18/2011   Dementia (HCC) 09/18/2011   Diabetes mellitus    Diverticulosis of colon without hemorrhage 02/27/2014   Foot ulcer (HCC) 09/18/2011   High cholesterol    HTN (hypertension) 09/18/2011   Hyperlipidemia 11/08/2011   Hypertension    Impaired glucose tolerance 09/18/2011   Pneumonia 09/18/2011   Pre-ulcerative corn or callous 11/20/2011   Stroke (HCC)    left leg deficit   Type II or unspecified type diabetes mellitus without mention of complication, uncontrolled 09/20/2011   Wears glasses     Past Surgical History:  Procedure Laterality Date   A/V FISTULAGRAM Left 12/13/2022   Procedure: A/V Fistulagram;  Surgeon: Maeola Harman, MD;  Location: [facility] INVASIVE CV LAB;  Service: Cardiovascular;  Laterality: Left;   AV FISTULA PLACEMENT Left 12/23/2017   Procedure: CREATION OF BASILIAC - CEPHALIC  ARTERIOVENOUS (AV) FISTULA  STAGE ONE;  Surgeon: Maeola Harman, MD;  Location: [facility] OR;  Service: Vascular;  Laterality: Left;   AV FISTULA PLACEMENT Left 07/26/2018   Procedure: INSERTION OF ARTERIOVENOUS (AV) GORE-TEX GRAFT ARM;  Surgeon: Maeola Harman, MD;  Location: [facility] OR;  Service: Vascular;  Laterality: Left;   BASCILIC VEIN TRANSPOSITION Left  03/01/2018   Procedure: BASILIC VEIN TRANSPOSITION SECOND STAGE;  Surgeon: Maeola Harman, MD;  Location: [facility] OR;  Service: Vascular;  Laterality: Left;   INSERTION OF DIALYSIS CATHETER Right 12/23/2017   Procedure: INSERTION OF PALINDROME  DIALYSIS CATHETER;  Surgeon: Maeola Harman, MD;  Location: [facility] OR;  Service: Vascular;  Laterality: Right;   PERIPHERAL VASCULAR BALLOON ANGIOPLASTY  12/13/2022   Procedure: PERIPHERAL VASCULAR BALLOON ANGIOPLASTY;  Surgeon: Maeola Harman, MD;  Location: [facility] INVASIVE CV LAB;  Service: Cardiovascular;;   THROMBECTOMY BRACHIAL ARTERY Left 07/26/2018   Procedure: THROMBECTOMY LEFT UPPER  EXTREMITY;  Surgeon: Maeola Harman, MD;  Location: [facility] OR;  Service: Vascular;  Laterality: Left;   TONSILLECTOMY  1985   VEIN REPAIR Left 07/26/2018   Procedure: STENT LEFT AXILLARY VEIN;  Surgeon: Maeola Harman, MD;  Location: [facility] OR;  Service: Vascular;  Laterality: Left;    Allergies  Allergen Reactions   Lipitor [Atorvastatin] Other (See Comments)    Current Outpatient Medications  Medication Sig Dispense Refill   acetaminophen (TYLENOL) 650 MG CR tablet Take 1,300 mg by mouth every 8 (eight) hours as needed for pain.     aspirin 81 MG EC tablet Take by mouth.     diphenhydrAMINE (BENADRYL) 25 mg capsule Take 25 mg by mouth daily as needed for itching.     Exenatide ER (BYDUREON BCISE) 2 MG/0.85ML AUIJ Inject 0.85 mLs into the skin once a week. 10.2 mL 1   glucose blood (ONE TOUCH ULTRA TEST) test strip Use as instructed once daily E11.9 100 each 12   Lancets (ONETOUCH ULTRASOFT) lancets 1 each by Other route 2 (two) times daily. Use to check blood sugars once a day Dx e11.9 100 each 3   metoprolol succinate (TOPROL-XL) 50 MG 24 hr tablet Take 1 tablet (50 mg total) by mouth daily. Take with or immediately following a meal. 90 tablet 3   pioglitazone (ACTOS) 15 MG tablet Take 1 tablet (15 mg total) by mouth daily. 90 tablet 1   rosuvastatin (CRESTOR) 20 MG tablet Take 1 tablet (20 mg total) by mouth daily. 90 tablet 3   VELPHORO 500 MG chewable tablet Chew 500 mg by mouth 3 (three) times daily with meals.     Current Facility-Administered Medications  Medication Dose Route Frequency Provider Last Rate Last Admin   0.9 %  sodium chloride infusion  250 mL Intravenous PRN Maeola Harman, MD       sodium chloride flush (NS) 0.9 % injection 3 mL  3 mL Intravenous Q12H Maeola Harman, MD        Family History  Problem Relation Age of Onset   Prostate cancer Paternal Grandfather    Diabetes Mother    Heart disease Mother    Transient  ischemic attack Mother    Diabetes Sister    Diabetes Maternal Grandfather    Colon cancer Neg Hx     Social History   Socioeconomic History   Marital status: Single    Spouse name: Not on file   Number of children: Not on file   Years of education: Not on file   Highest education level: Not on file  Occupational History   Not on file  Tobacco Use   Smoking status: Former    Current packs/day: 0.50    Types: Cigarettes   Smokeless tobacco: Never  Vaping Use   Vaping status: Never Used  Substance and Sexual Activity   Alcohol use: Not Currently  Alcohol/week: 2.0 standard drinks of alcohol    Types: 2 Cans of beer per week   Drug use: No   Sexual activity: Not on file  Other Topics Concern   Not on file  Social History Narrative   Not on file   Social Determinants of Health   Financial Resource Strain: Low Risk  (10/14/2022)   Overall Financial Resource Strain (CARDIA)    Difficulty of Paying Living Expenses: Not hard at all  Food Insecurity: No Food Insecurity (10/14/2022)   Hunger Vital Sign    Worried About Running Out of Food in the Last Year: Never true    Ran Out of Food in the Last Year: Never true  Transportation Needs: No Transportation Needs (10/14/2022)   PRAPARE - Administrator, Civil Service (Medical): No    Lack of Transportation (Non-Medical): No  Physical Activity: Inactive (10/14/2022)   Exercise Vital Sign    Days of Exercise per Week: 0 days    Minutes of Exercise per Session: 0 min  Stress: No Stress Concern Present (10/14/2022)   [employer] of Occupational Health - Occupational Stress Questionnaire    Feeling of Stress : Not at all  Social Connections: Moderately Integrated (10/14/2022)   Social Connection and Isolation Panel [NHANES]    Frequency of Communication with Friends and Family: More than three times a week    Frequency of Social Gatherings with Friends and Family: More than three times a week    Attends Religious  Services: More than 4 times per year    Active Member of [employer] or Organizations: Yes    Attends Banker Meetings: More than 4 times per year    Marital Status: Never married  Intimate Partner Violence: Not At Risk (10/14/2022)   Humiliation, Afraid, Rape, and Kick questionnaire    Fear of Current or Ex-Partner: No    Emotionally Abused: No    Physically Abused: No    Sexually Abused: No     ROS: [x]  Positive   [ ]  Negative   [ ]  All sytems reviewed and are negative  Cardiac: []  chest pain/pressure []  SOB/DOE  Vascular: []  pain in legs while walking []  pain in feet when lying flat []  swelling in legs  Pulmonary: []  asthma []  wheezing  Neurologic: []  hx CVA/TIA  Hematologic: []  bleeding problems  GI []  GERD  GU: [x]  CKD/renal failure  [x]  HD---[]  M/W/F [x]  T/T/S  Psychiatric: []  hx of major depression  Integumentary: []  rashes []  ulcers  Constitutional: []  fever []  chills   PHYSICAL EXAMINATION:  Today's Vitals   01/26/23 0938  BP: (!) 164/83  Pulse: (!) 103  Temp: 98.3 F (36.8 C)  TempSrc: Temporal  SpO2: 98%  Weight: 164 lb (74.4 kg)   Body mass index is 22.24 kg/m.    General:  WDWN male in NAD Gait: Not observed HENT: WNL Pulmonary: normal non-labored breathing  Cardiac: regular Skin: without rashes Vascular Exam/Pulses:   Right Left  Radial 2+ (normal) 2+ (normal)   Extremities:  excellent thrill in graft left upper arm.  Musculoskeletal: no muscle wasting or atrophy  Neurologic: A&O X 3  Non-Invasive Vascular Imaging:   Upper Extremity Vein Mapping on 01/12/2023: +-----------------+-------------+----------+-------------------------+  Right Cephalic   Diameter (cm)Depth (cm)        Findings           +-----------------+-------------+----------+-------------------------+  Shoulder            0.14                                          +-----------------+-------------+----------+-------------------------+  Prox upper arm       0.15                                          +-----------------+-------------+----------+-------------------------+  Mid upper arm        0.13                                          +-----------------+-------------+----------+-------------------------+  Dist upper arm       0.09                                          +-----------------+-------------+----------+-------------------------+  Antecubital fossa    0.11                                          +-----------------+-------------+----------+-------------------------+  Prox forearm         0.10               possible chronic thrombus  +-----------------+-------------+----------+-------------------------+  Mid forearm                             possible chronic thrombus  +-----------------+-------------+----------+-------------------------+  Dist forearm         0.13                                          +-----------------+-------------+----------+-------------------------+   +-----------------+-------------+----------+--------+  Right Basilic    Diameter (cm)Depth (cm)Findings  +-----------------+-------------+----------+--------+  Mid upper arm        0.18                         +-----------------+-------------+----------+--------+  Dist upper arm       0.20                         +-----------------+-------------+----------+--------+  Antecubital fossa    0.19                         +-----------------+-------------+----------+--------+     ASSESSMENT/PLAN: 62 y.o. male with ESRD here for evaluation of his hemodialysis access with most recent hx of fistulogram with drug coated balloon angioplasty of left SCV and plain balloon angioplasty of in stent restenosis left axillary vein 12/13/2022   -pt has had clear drainage from distal aneurysmal area.  He most recently had some bloody ooze (not pressurized) from this area.  Pt seen with Dr. Randie Heinz and  will plan for revision with new graft tunneled laterally and will plan to place tunneled dialysis catheter at that time next week.  Discussed with pt and his brother that if he develops significant bleeding from the left arm graft, to hold pressure above and below the area and call 911.   -pt is on dialysis on T/T/S at [facility] [address] location. -discussed with pt that access does not last forever and will need intervention or  even new access at some point.  -pt is right hand dominant -pt is not on anticoagulation   Doreatha Massed, [facility] [phone]  Clinic MD:   Randie Heinz

## 2023-01-25 NOTE — H&P (View-Only) (Signed)
HISTORY AND PHYSICAL     CC:  dialysis access Requesting Provider:  Corwin Levins, MD  HPI: This is a 61 y.o. male here for evaluation of his hemodialysis access.    Dialysis access history: -left 1st stage BVT 12/23/2017 Dr. Randie Heinz -left 2nd stage BVT 03/01/2018 Dr. Randie Heinz -LUA AVG, stent removal and thrombectomy left axillary and subclavian veins, stent left axillary vein 07/26/2018 Dr. Randie Heinz -fistulogram with drug coated balloon angioplasty of left SCV and plain balloon angioplasty of in stent restenosis left axillary vein 12/13/2022  Pt was last seen by Dr. Randie Heinz on 01/12/2023 and at that time, pt stated the graft was working well and had been easy to cannulate and getting good clearance.   He was not having any pain in the left arm.  He did not have any fistula options in the RUE for the future.  Possibly in the future if he has issues we would consider interposition AV grafting and temporary tunneled dialysis catheter. He was to follow up as needed.  The pt returns today for follow up and here with his brother.  Pt tells me that he has had clear drainage from the area on the lower aneurysmal part for about a week.  He states the fluid is clear and sticky.  No purulent drainage.  He has not had any fevers.  He states it is working well since his last fistulogram last month.  He states the dialysis techs are sticking around the worrisome area.    The pt is right hand dominant.    Pt is on dialysis.   Days of dialysis if applicable:  T/T/S    HD center:  [address]  location.   The pt is on a statin for cholesterol management.  The pt is on a daily aspirin.  Other AC:  none The pt is on BB for hypertension.  The pt is  on medication for diabetes.   Tobacco hx:  former  Past Medical History:  Diagnosis Date   Alcohol abuse 09/18/2011   Allergic rhinitis, cause unspecified 09/20/2011   Anemia, unspecified 09/18/2011   Arthritis    Childhood asthma 09/20/2011   CKD (chronic kidney disease)  stage 5, GFR less than 15 ml/min (HCC)    Dialysis - T/Th/Sa   Colitis 09/18/2011   Dementia (HCC) 09/18/2011   Diabetes mellitus    Diverticulosis of colon without hemorrhage 02/27/2014   Foot ulcer (HCC) 09/18/2011   High cholesterol    HTN (hypertension) 09/18/2011   Hyperlipidemia 11/08/2011   Hypertension    Impaired glucose tolerance 09/18/2011   Pneumonia 09/18/2011   Pre-ulcerative corn or callous 11/20/2011   Stroke (HCC)    left leg deficit   Type II or unspecified type diabetes mellitus without mention of complication, uncontrolled 09/20/2011   Wears glasses     Past Surgical History:  Procedure Laterality Date   A/V FISTULAGRAM Left 12/13/2022   Procedure: A/V Fistulagram;  Surgeon: Maeola Harman, MD;  Location: [facility] INVASIVE CV LAB;  Service: Cardiovascular;  Laterality: Left;   AV FISTULA PLACEMENT Left 12/23/2017   Procedure: CREATION OF BASILIAC - CEPHALIC  ARTERIOVENOUS (AV) FISTULA  STAGE ONE;  Surgeon: Maeola Harman, MD;  Location: [facility] OR;  Service: Vascular;  Laterality: Left;   AV FISTULA PLACEMENT Left 07/26/2018   Procedure: INSERTION OF ARTERIOVENOUS (AV) GORE-TEX GRAFT ARM;  Surgeon: Maeola Harman, MD;  Location: [facility] OR;  Service: Vascular;  Laterality: Left;   BASCILIC VEIN TRANSPOSITION Left  03/01/2018   Procedure: BASILIC VEIN TRANSPOSITION SECOND STAGE;  Surgeon: Maeola Harman, MD;  Location: [facility] OR;  Service: Vascular;  Laterality: Left;   INSERTION OF DIALYSIS CATHETER Right 12/23/2017   Procedure: INSERTION OF PALINDROME  DIALYSIS CATHETER;  Surgeon: Maeola Harman, MD;  Location: [facility] OR;  Service: Vascular;  Laterality: Right;   PERIPHERAL VASCULAR BALLOON ANGIOPLASTY  12/13/2022   Procedure: PERIPHERAL VASCULAR BALLOON ANGIOPLASTY;  Surgeon: Maeola Harman, MD;  Location: [facility] INVASIVE CV LAB;  Service: Cardiovascular;;   THROMBECTOMY BRACHIAL ARTERY Left 07/26/2018   Procedure: THROMBECTOMY LEFT UPPER  EXTREMITY;  Surgeon: Maeola Harman, MD;  Location: [facility] OR;  Service: Vascular;  Laterality: Left;   TONSILLECTOMY  1985   VEIN REPAIR Left 07/26/2018   Procedure: STENT LEFT AXILLARY VEIN;  Surgeon: Maeola Harman, MD;  Location: [facility] OR;  Service: Vascular;  Laterality: Left;    Allergies  Allergen Reactions   Lipitor [Atorvastatin] Other (See Comments)    Current Outpatient Medications  Medication Sig Dispense Refill   acetaminophen (TYLENOL) 650 MG CR tablet Take 1,300 mg by mouth every 8 (eight) hours as needed for pain.     aspirin 81 MG EC tablet Take by mouth.     diphenhydrAMINE (BENADRYL) 25 mg capsule Take 25 mg by mouth daily as needed for itching.     Exenatide ER (BYDUREON BCISE) 2 MG/0.85ML AUIJ Inject 0.85 mLs into the skin once a week. 10.2 mL 1   glucose blood (ONE TOUCH ULTRA TEST) test strip Use as instructed once daily E11.9 100 each 12   Lancets (ONETOUCH ULTRASOFT) lancets 1 each by Other route 2 (two) times daily. Use to check blood sugars once a day Dx e11.9 100 each 3   metoprolol succinate (TOPROL-XL) 50 MG 24 hr tablet Take 1 tablet (50 mg total) by mouth daily. Take with or immediately following a meal. 90 tablet 3   pioglitazone (ACTOS) 15 MG tablet Take 1 tablet (15 mg total) by mouth daily. 90 tablet 1   rosuvastatin (CRESTOR) 20 MG tablet Take 1 tablet (20 mg total) by mouth daily. 90 tablet 3   VELPHORO 500 MG chewable tablet Chew 500 mg by mouth 3 (three) times daily with meals.     Current Facility-Administered Medications  Medication Dose Route Frequency Provider Last Rate Last Admin   0.9 %  sodium chloride infusion  250 mL Intravenous PRN Maeola Harman, MD       sodium chloride flush (NS) 0.9 % injection 3 mL  3 mL Intravenous Q12H Maeola Harman, MD        Family History  Problem Relation Age of Onset   Prostate cancer Paternal Grandfather    Diabetes Mother    Heart disease Mother    Transient  ischemic attack Mother    Diabetes Sister    Diabetes Maternal Grandfather    Colon cancer Neg Hx     Social History   Socioeconomic History   Marital status: Single    Spouse name: Not on file   Number of children: Not on file   Years of education: Not on file   Highest education level: Not on file  Occupational History   Not on file  Tobacco Use   Smoking status: Former    Current packs/day: 0.50    Types: Cigarettes   Smokeless tobacco: Never  Vaping Use   Vaping status: Never Used  Substance and Sexual Activity   Alcohol use: Not Currently  Alcohol/week: 2.0 standard drinks of alcohol    Types: 2 Cans of beer per week   Drug use: No   Sexual activity: Not on file  Other Topics Concern   Not on file  Social History Narrative   Not on file   Social Determinants of Health   Financial Resource Strain: Low Risk  (10/14/2022)   Overall Financial Resource Strain (CARDIA)    Difficulty of Paying Living Expenses: Not hard at all  Food Insecurity: No Food Insecurity (10/14/2022)   Hunger Vital Sign    Worried About Running Out of Food in the Last Year: Never true    Ran Out of Food in the Last Year: Never true  Transportation Needs: No Transportation Needs (10/14/2022)   PRAPARE - Administrator, Civil Service (Medical): No    Lack of Transportation (Non-Medical): No  Physical Activity: Inactive (10/14/2022)   Exercise Vital Sign    Days of Exercise per Week: 0 days    Minutes of Exercise per Session: 0 min  Stress: No Stress Concern Present (10/14/2022)   [employer] of Occupational Health - Occupational Stress Questionnaire    Feeling of Stress : Not at all  Social Connections: Moderately Integrated (10/14/2022)   Social Connection and Isolation Panel [NHANES]    Frequency of Communication with Friends and Family: More than three times a week    Frequency of Social Gatherings with Friends and Family: More than three times a week    Attends Religious  Services: More than 4 times per year    Active Member of [employer] or Organizations: Yes    Attends Banker Meetings: More than 4 times per year    Marital Status: Never married  Intimate Partner Violence: Not At Risk (10/14/2022)   Humiliation, Afraid, Rape, and Kick questionnaire    Fear of Current or Ex-Partner: No    Emotionally Abused: No    Physically Abused: No    Sexually Abused: No     ROS: [x]  Positive   [ ]  Negative   [ ]  All sytems reviewed and are negative  Cardiac: []  chest pain/pressure []  SOB/DOE  Vascular: []  pain in legs while walking []  pain in feet when lying flat []  swelling in legs  Pulmonary: []  asthma []  wheezing  Neurologic: []  hx CVA/TIA  Hematologic: []  bleeding problems  GI []  GERD  GU: [x]  CKD/renal failure  [x]  HD---[]  M/W/F [x]  T/T/S  Psychiatric: []  hx of major depression  Integumentary: []  rashes []  ulcers  Constitutional: []  fever []  chills   PHYSICAL EXAMINATION:  Today's Vitals   01/26/23 0938  BP: (!) 164/83  Pulse: (!) 103  Temp: 98.3 F (36.8 C)  TempSrc: Temporal  SpO2: 98%  Weight: 164 lb (74.4 kg)   Body mass index is 22.24 kg/m.    General:  WDWN male in NAD Gait: Not observed HENT: WNL Pulmonary: normal non-labored breathing  Cardiac: regular Skin: without rashes Vascular Exam/Pulses:   Right Left  Radial 2+ (normal) 2+ (normal)   Extremities:  excellent thrill in graft left upper arm.  Musculoskeletal: no muscle wasting or atrophy  Neurologic: A&O X 3  Non-Invasive Vascular Imaging:   Upper Extremity Vein Mapping on 01/12/2023: +-----------------+-------------+----------+-------------------------+  Right Cephalic   Diameter (cm)Depth (cm)        Findings           +-----------------+-------------+----------+-------------------------+  Shoulder            0.14                                          +-----------------+-------------+----------+-------------------------+  Prox upper arm       0.15                                          +-----------------+-------------+----------+-------------------------+  Mid upper arm        0.13                                          +-----------------+-------------+----------+-------------------------+  Dist upper arm       0.09                                          +-----------------+-------------+----------+-------------------------+  Antecubital fossa    0.11                                          +-----------------+-------------+----------+-------------------------+  Prox forearm         0.10               possible chronic thrombus  +-----------------+-------------+----------+-------------------------+  Mid forearm                             possible chronic thrombus  +-----------------+-------------+----------+-------------------------+  Dist forearm         0.13                                          +-----------------+-------------+----------+-------------------------+   +-----------------+-------------+----------+--------+  Right Basilic    Diameter (cm)Depth (cm)Findings  +-----------------+-------------+----------+--------+  Mid upper arm        0.18                         +-----------------+-------------+----------+--------+  Dist upper arm       0.20                         +-----------------+-------------+----------+--------+  Antecubital fossa    0.19                         +-----------------+-------------+----------+--------+     ASSESSMENT/PLAN: 62 y.o. male with ESRD here for evaluation of his hemodialysis access with most recent hx of fistulogram with drug coated balloon angioplasty of left SCV and plain balloon angioplasty of in stent restenosis left axillary vein 12/13/2022   -pt has had clear drainage from distal aneurysmal area.  He most recently had some bloody ooze (not pressurized) from this area.  Pt seen with Dr. Randie Heinz and  will plan for revision with new graft tunneled laterally and will plan to place tunneled dialysis catheter at that time next week.  Discussed with pt and his brother that if he develops significant bleeding from the left arm graft, to hold pressure above and below the area and call 911.   -pt is on dialysis on T/T/S at [facility] [address] location. -discussed with pt that access does not last forever and will need intervention or  even new access at some point.  -pt is right hand dominant -pt is not on anticoagulation   Doreatha Massed, [facility] [phone]  Clinic MD:   Randie Heinz

## 2023-01-26 ENCOUNTER — Ambulatory Visit (INDEPENDENT_AMBULATORY_CARE_PROVIDER_SITE_OTHER): Payer: Medicare Other | Admitting: Physician Assistant

## 2023-01-26 VITALS — BP 164/83 | HR 103 | Temp 98.3°F | Wt 164.0 lb

## 2023-01-26 DIAGNOSIS — N186 End stage renal disease: Secondary | ICD-10-CM

## 2023-01-27 ENCOUNTER — Other Ambulatory Visit: Payer: Self-pay

## 2023-01-27 DIAGNOSIS — E1129 Type 2 diabetes mellitus with other diabetic kidney complication: Secondary | ICD-10-CM | POA: Diagnosis not present

## 2023-01-27 DIAGNOSIS — N186 End stage renal disease: Secondary | ICD-10-CM

## 2023-01-27 DIAGNOSIS — N2581 Secondary hyperparathyroidism of renal origin: Secondary | ICD-10-CM | POA: Diagnosis not present

## 2023-01-27 DIAGNOSIS — D631 Anemia in chronic kidney disease: Secondary | ICD-10-CM | POA: Diagnosis not present

## 2023-01-27 DIAGNOSIS — Z992 Dependence on renal dialysis: Secondary | ICD-10-CM | POA: Diagnosis not present

## 2023-01-27 DIAGNOSIS — D689 Coagulation defect, unspecified: Secondary | ICD-10-CM | POA: Diagnosis not present

## 2023-01-27 DIAGNOSIS — S51802S Unspecified open wound of left forearm, sequela: Secondary | ICD-10-CM | POA: Diagnosis not present

## 2023-01-27 NOTE — Addendum Note (Signed)
Addended by: Primitivo Gauze on: 01/27/2023 02:29 PM   Modules accepted: Orders

## 2023-01-29 DIAGNOSIS — S51802S Unspecified open wound of left forearm, sequela: Secondary | ICD-10-CM | POA: Diagnosis not present

## 2023-01-29 DIAGNOSIS — Z992 Dependence on renal dialysis: Secondary | ICD-10-CM | POA: Diagnosis not present

## 2023-01-29 DIAGNOSIS — D689 Coagulation defect, unspecified: Secondary | ICD-10-CM | POA: Diagnosis not present

## 2023-01-29 DIAGNOSIS — E1129 Type 2 diabetes mellitus with other diabetic kidney complication: Secondary | ICD-10-CM | POA: Diagnosis not present

## 2023-01-29 DIAGNOSIS — D631 Anemia in chronic kidney disease: Secondary | ICD-10-CM | POA: Diagnosis not present

## 2023-01-29 DIAGNOSIS — N2581 Secondary hyperparathyroidism of renal origin: Secondary | ICD-10-CM | POA: Diagnosis not present

## 2023-01-29 DIAGNOSIS — N186 End stage renal disease: Secondary | ICD-10-CM | POA: Diagnosis not present

## 2023-02-01 DIAGNOSIS — D689 Coagulation defect, unspecified: Secondary | ICD-10-CM | POA: Diagnosis not present

## 2023-02-01 DIAGNOSIS — E1129 Type 2 diabetes mellitus with other diabetic kidney complication: Secondary | ICD-10-CM | POA: Diagnosis not present

## 2023-02-01 DIAGNOSIS — N2581 Secondary hyperparathyroidism of renal origin: Secondary | ICD-10-CM | POA: Diagnosis not present

## 2023-02-01 DIAGNOSIS — Z992 Dependence on renal dialysis: Secondary | ICD-10-CM | POA: Diagnosis not present

## 2023-02-01 DIAGNOSIS — D631 Anemia in chronic kidney disease: Secondary | ICD-10-CM | POA: Diagnosis not present

## 2023-02-01 DIAGNOSIS — N186 End stage renal disease: Secondary | ICD-10-CM | POA: Diagnosis not present

## 2023-02-01 DIAGNOSIS — S51802S Unspecified open wound of left forearm, sequela: Secondary | ICD-10-CM | POA: Diagnosis not present

## 2023-02-02 ENCOUNTER — Encounter (HOSPITAL_COMMUNITY): Payer: Self-pay | Admitting: Vascular Surgery

## 2023-02-02 ENCOUNTER — Other Ambulatory Visit: Payer: Self-pay

## 2023-02-02 NOTE — Progress Notes (Signed)
Anesthesia Chart Review: SAME DAY WORK-UP  Case: [id number] Date/Time: 02/04/23 1200   Procedures:      REVISION OF LEFT ARM ARTERIOVENOUS GORETEX GRAFT (Left)     INSERTION OF TUNNELED DIALYSIS CATHETER   Anesthesia type: Choice   Pre-op diagnosis: ESRD   [facility] ROOM 11 / [facility] OR   Surgeons: Maeola Harman, MD       DISCUSSION: Patient is a 62 year old male scheduled for the above procedure. Per [facility] APP evaluation on 01/26/23, LUE AVGG with clear drainage from the distal aneurysmal area with recent bloody ooze. Dr. Randie Heinz recommended revision with new graft tunneled laterally and placement of TDC. In the meantime, he and brother were notified to apply pressure and call 911 if he develops any significant bleeding from the left arm graft.  History includes smoking, HTN, DM, ESRD (TTS, [address] [facility]), anemia, alcohol abuse (sober since ~ 2017), hypercholesterolemia, CVA (LLE weakness), dementia.  He is followed by long term PCP Dr. Oliver Barre since at least 2013, last visit 11/29/22 for wellness exam.  A1c 6.4%.   Echo on 07/17/18 showed mild LVH, mildly reduced LV systolic function, EF 45 to 50%, mild diffuse hypokinesis with no identifiable regional variations, grade 1 diastolic dysfunction, trivial MR.  He was subsequently referred to cardiologist Nanetta Batty, MD and was evaluated on 08/23/18. He felt patient's mild LV dysfunction was multifactorial from long history of controlled hypertension as well as ethanol abuse, now sober. He had been on hemodialysis for about six months at that time. He did not order any additional testing, and as needed cardiology follow-up recommended.   He is a same day work-up. Anesthesia team to evaluate on the day of surgery.   VS: Ht 6' (1.829 m)   Wt 74.8 kg   BMI 22.38 kg/m  BP Readings from Last 3 Encounters:  01/26/23 (!) 164/83  01/12/23 (!) 168/83  12/13/22 (!) 158/104   Pulse Readings from Last 3 Encounters:  01/26/23 (!) 103   01/12/23 92  12/13/22 93    PROVIDERS: Corwin Levins, MD is PCP    LABS: For day of surgery. Most recent labs in [facility] include: Lab Results  Component Value Date   HGB 11.6 (L) 12/13/2022   HCT 34.0 (L) 12/13/2022   GLUCOSE 91 12/13/2022   NA 134 (L) 12/13/2022   K 5.0 12/13/2022   CL 93 (L) 12/13/2022   CREATININE 10.40 (H) 12/13/2022   BUN 20 12/13/2022   HGBA1C 6.4 (A) 11/29/2022     EKG: 12/13/22: Normal sinus rhythm Normal ECG When compared with ECG of 22-Nov-2017 15:50, PREVIOUS ECG IS PRESENT Since last tracing rate faster Confirmed by Nanetta Batty [phone]) on 12/13/2022 5:04:40 PM   CV: Echo 07/17/18: Study Conclusions  - Procedure narrative: Transthoracic echocardiography. Image    quality was adequate. Intravenous contrast (Definity) was    administered to opacify the LV.  - Left ventricle: The cavity size was normal. Wall thickness was    increased in a pattern of mild LVH. Systolic function was mildly    reduced. The estimated ejection fraction was in the range of 45%    to 50%. Mild diffuse hypokinesis with no identifiable regional    variations. There was an increased relative contribution of    atrial contraction to ventricular filling. Doppler parameters are    consistent with abnormal left ventricular relaxation (grade 1    diastolic dysfunction).  - Mitral valve: There was trivial regurgitation.  US Carotid 07/17/18: Summary:  - Right Carotid: Velocities in the right ICA are consistent with a 1-39% stenosis.  - Left Carotid: The extracranial vessels were near-normal with only minimal  wall thickening or plaque.  - Vertebrals:  Bilateral vertebral arteries demonstrate antegrade flow.  - Subclavians: Right subclavian artery flow was disturbed. Normal flow hemodynamics were seen in the left subclavian artery.    Past Medical History:  Diagnosis Date   Alcohol abuse 09/18/2011   Allergic rhinitis, cause unspecified 09/20/2011   Anemia, unspecified  09/18/2011   Arthritis    Childhood asthma 09/20/2011   CKD (chronic kidney disease) stage 5, GFR less than 15 ml/min (HCC)    Dialysis - T/Th/Sa   Colitis 09/18/2011   Dementia (HCC) 09/18/2011   Diabetes mellitus    Diverticulosis of colon without hemorrhage 02/27/2014   Foot ulcer (HCC) 09/18/2011   High cholesterol    HTN (hypertension) 09/18/2011   Hyperlipidemia 11/08/2011   Hypertension    Impaired glucose tolerance 09/18/2011   Pneumonia 09/18/2011   Pre-ulcerative corn or callous 11/20/2011   Stroke (HCC)    left leg deficit   Type II or unspecified type diabetes mellitus without mention of complication, uncontrolled 09/20/2011   Wears glasses     Past Surgical History:  Procedure Laterality Date   A/V FISTULAGRAM Left 12/13/2022   Procedure: A/V Fistulagram;  Surgeon: Maeola Harman, MD;  Location: [facility] INVASIVE CV LAB;  Service: Cardiovascular;  Laterality: Left;   AV FISTULA PLACEMENT Left 12/23/2017   Procedure: CREATION OF BASILIAC - CEPHALIC  ARTERIOVENOUS (AV) FISTULA  STAGE ONE;  Surgeon: Maeola Harman, MD;  Location: [facility] OR;  Service: Vascular;  Laterality: Left;   AV FISTULA PLACEMENT Left 07/26/2018   Procedure: INSERTION OF ARTERIOVENOUS (AV) GORE-TEX GRAFT ARM;  Surgeon: Maeola Harman, MD;  Location: [facility] OR;  Service: Vascular;  Laterality: Left;   BASCILIC VEIN TRANSPOSITION Left 03/01/2018   Procedure: BASILIC VEIN TRANSPOSITION SECOND STAGE;  Surgeon: Maeola Harman, MD;  Location: [facility] OR;  Service: Vascular;  Laterality: Left;   INSERTION OF DIALYSIS CATHETER Right 12/23/2017   Procedure: INSERTION OF PALINDROME  DIALYSIS CATHETER;  Surgeon: Maeola Harman, MD;  Location: [facility] OR;  Service: Vascular;  Laterality: Right;   PERIPHERAL VASCULAR BALLOON ANGIOPLASTY  12/13/2022   Procedure: PERIPHERAL VASCULAR BALLOON ANGIOPLASTY;  Surgeon: Maeola Harman, MD;  Location: [facility] INVASIVE CV LAB;  Service:  Cardiovascular;;   THROMBECTOMY BRACHIAL ARTERY Left 07/26/2018   Procedure: THROMBECTOMY LEFT UPPER EXTREMITY;  Surgeon: Maeola Harman, MD;  Location: [facility] OR;  Service: Vascular;  Laterality: Left;   TONSILLECTOMY  1985   VEIN REPAIR Left 07/26/2018   Procedure: STENT LEFT AXILLARY VEIN;  Surgeon: Maeola Harman, MD;  Location: [facility] OR;  Service: Vascular;  Laterality: Left;    MEDICATIONS:  0.9 %  sodium chloride infusion   sodium chloride flush (NS) 0.9 % injection 3 mL    acetaminophen (TYLENOL) 650 MG CR tablet   aspirin 81 MG EC tablet   diphenhydrAMINE (BENADRYL) 25 mg capsule   Exenatide ER (BYDUREON BCISE) 2 MG/0.85ML AUIJ   metoprolol succinate (TOPROL-XL) 50 MG 24 hr tablet   pioglitazone (ACTOS) 15 MG tablet   rosuvastatin (CRESTOR) 20 MG tablet   VELPHORO 500 MG chewable tablet   glucose blood (ONE TOUCH ULTRA TEST) test strip   Lancets (ONETOUCH ULTRASOFT) lancets    Shonna Chock, PA-C Surgical Short Stay/Anesthesiology [facility] Phone [phone] [facility] Phone [phone])  [phone] 02/02/2023 6:55 PM

## 2023-02-02 NOTE — Anesthesia Preprocedure Evaluation (Addendum)
Anesthesia Evaluation  Patient identified by MRN, date of birth, ID band Patient awake    Reviewed: Allergy & Precautions, H&P , NPO status , Patient's Chart, lab work & pertinent test results  Airway Mallampati: II   Neck ROM: full    Dental   Pulmonary asthma , Current Smoker   breath sounds clear to auscultation       Cardiovascular hypertension,  Rhythm:regular Rate:Normal     Neuro/Psych  PSYCHIATRIC DISORDERS     Dementia CVA    GI/Hepatic   Endo/Other  diabetes, Type 2    Renal/GU ESRF and DialysisRenal disease     Musculoskeletal  (+) Arthritis ,    Abdominal   Peds  Hematology   Anesthesia Other Findings   Reproductive/Obstetrics                             Anesthesia Physical Anesthesia Plan  ASA: 3  Anesthesia Plan: General   Post-op Pain Management:    Induction: Intravenous  PONV Risk Score and Plan: 1 and Ondansetron, Dexamethasone and Treatment may vary due to age or medical condition  Airway Management Planned: LMA  Additional Equipment:   Intra-op Plan:   Post-operative Plan: Extubation in OR  Informed Consent: I have reviewed the patients History and Physical, chart, labs and discussed the procedure including the risks, benefits and alternatives for the proposed anesthesia with the patient or authorized representative who has indicated his/her understanding and acceptance.     Dental advisory given  Plan Discussed with: CRNA, Anesthesiologist and Surgeon  Anesthesia Plan Comments: (PAT note written 02/02/2023 by Shonna Chock, PA-C.  )       Anesthesia Quick Evaluation

## 2023-02-02 NOTE — Progress Notes (Signed)
PCP -  Dr. Oliver Barre Cardiologist - Dr. Nanetta Batty  Chest x-ray - n/a EKG - 12-13-22 Stress Test -  ECHO - 07-17-2018 Cardiac Cath - n/a  Sleep Study - n/a  Last dose of GLP1 agonist-   GLP1 instructions: last dose Sunday 01-30-23  NPO  Anesthesia review: yes  Patient denies shortness of breath, fever, cough and chest pain via phone call.

## 2023-02-03 DIAGNOSIS — E1129 Type 2 diabetes mellitus with other diabetic kidney complication: Secondary | ICD-10-CM | POA: Diagnosis not present

## 2023-02-03 DIAGNOSIS — N186 End stage renal disease: Secondary | ICD-10-CM | POA: Diagnosis not present

## 2023-02-03 DIAGNOSIS — D689 Coagulation defect, unspecified: Secondary | ICD-10-CM | POA: Diagnosis not present

## 2023-02-03 DIAGNOSIS — D631 Anemia in chronic kidney disease: Secondary | ICD-10-CM | POA: Diagnosis not present

## 2023-02-03 DIAGNOSIS — S51802S Unspecified open wound of left forearm, sequela: Secondary | ICD-10-CM | POA: Diagnosis not present

## 2023-02-03 DIAGNOSIS — Z992 Dependence on renal dialysis: Secondary | ICD-10-CM | POA: Diagnosis not present

## 2023-02-03 DIAGNOSIS — N2581 Secondary hyperparathyroidism of renal origin: Secondary | ICD-10-CM | POA: Diagnosis not present

## 2023-02-04 ENCOUNTER — Encounter (HOSPITAL_COMMUNITY): Payer: Self-pay | Admitting: Vascular Surgery

## 2023-02-04 ENCOUNTER — Other Ambulatory Visit: Payer: Self-pay

## 2023-02-04 ENCOUNTER — Ambulatory Visit (HOSPITAL_BASED_OUTPATIENT_CLINIC_OR_DEPARTMENT_OTHER): Payer: Medicare Other | Admitting: Vascular Surgery

## 2023-02-04 ENCOUNTER — Ambulatory Visit (HOSPITAL_COMMUNITY): Payer: Medicare Other

## 2023-02-04 ENCOUNTER — Ambulatory Visit (HOSPITAL_COMMUNITY)
Admission: RE | Admit: 2023-02-04 | Discharge: 2023-02-04 | Disposition: A | Discharge status: Home / Self Care | Payer: Medicare Other | Source: Home / Self Care | Attending: Vascular Surgery | Admitting: Vascular Surgery

## 2023-02-04 ENCOUNTER — Encounter (HOSPITAL_COMMUNITY)
Admission: RE | Disposition: A | Discharge status: Home / Self Care | Payer: Self-pay | Source: Home / Self Care | Attending: Vascular Surgery

## 2023-02-04 ENCOUNTER — Ambulatory Visit (HOSPITAL_COMMUNITY): Payer: Medicare Other | Admitting: Vascular Surgery

## 2023-02-04 DIAGNOSIS — Y838 Other surgical procedures as the cause of abnormal reaction of the patient, or of later complication, without mention of misadventure at the time of the procedure: Secondary | ICD-10-CM | POA: Insufficient documentation

## 2023-02-04 DIAGNOSIS — Z87891 Personal history of nicotine dependence: Secondary | ICD-10-CM | POA: Diagnosis not present

## 2023-02-04 DIAGNOSIS — T82898A Other specified complication of vascular prosthetic devices, implants and grafts, initial encounter: Secondary | ICD-10-CM | POA: Diagnosis not present

## 2023-02-04 DIAGNOSIS — Z992 Dependence on renal dialysis: Secondary | ICD-10-CM | POA: Insufficient documentation

## 2023-02-04 DIAGNOSIS — J45909 Unspecified asthma, uncomplicated: Secondary | ICD-10-CM | POA: Insufficient documentation

## 2023-02-04 DIAGNOSIS — Z79899 Other long term (current) drug therapy: Secondary | ICD-10-CM | POA: Insufficient documentation

## 2023-02-04 DIAGNOSIS — F0390 Unspecified dementia without behavioral disturbance: Secondary | ICD-10-CM | POA: Insufficient documentation

## 2023-02-04 DIAGNOSIS — E1122 Type 2 diabetes mellitus with diabetic chronic kidney disease: Secondary | ICD-10-CM | POA: Insufficient documentation

## 2023-02-04 DIAGNOSIS — I120 Hypertensive chronic kidney disease with stage 5 chronic kidney disease or end stage renal disease: Secondary | ICD-10-CM | POA: Insufficient documentation

## 2023-02-04 DIAGNOSIS — E785 Hyperlipidemia, unspecified: Secondary | ICD-10-CM | POA: Insufficient documentation

## 2023-02-04 DIAGNOSIS — F17210 Nicotine dependence, cigarettes, uncomplicated: Secondary | ICD-10-CM | POA: Diagnosis not present

## 2023-02-04 DIAGNOSIS — E7800 Pure hypercholesterolemia, unspecified: Secondary | ICD-10-CM | POA: Diagnosis not present

## 2023-02-04 DIAGNOSIS — T82510A Breakdown (mechanical) of surgically created arteriovenous fistula, initial encounter: Secondary | ICD-10-CM | POA: Insufficient documentation

## 2023-02-04 DIAGNOSIS — N186 End stage renal disease: Secondary | ICD-10-CM | POA: Diagnosis not present

## 2023-02-04 DIAGNOSIS — M1990 Unspecified osteoarthritis, unspecified site: Secondary | ICD-10-CM | POA: Diagnosis not present

## 2023-02-04 DIAGNOSIS — Z452 Encounter for adjustment and management of vascular access device: Secondary | ICD-10-CM | POA: Diagnosis not present

## 2023-02-04 HISTORY — PX: REVISION OF ARTERIOVENOUS GORETEX GRAFT: SHX6073

## 2023-02-04 HISTORY — PX: MASS EXCISION: SHX2000

## 2023-02-04 HISTORY — PX: INSERTION OF DIALYSIS CATHETER: SHX1324

## 2023-02-04 LAB — POCT I-STAT, CHEM 8
BUN: 13 mg/dL (ref 8–23)
Calcium, Ion: 0.99 mmol/L — ABNORMAL LOW (ref 1.15–1.40)
Chloride: 95 mmol/L — ABNORMAL LOW (ref 98–111)
Creatinine, Ser: 7.1 mg/dL — ABNORMAL HIGH (ref 0.61–1.24)
Glucose, Bld: 82 mg/dL (ref 70–99)
HCT: 36 % — ABNORMAL LOW (ref 39.0–52.0)
Hemoglobin: 12.2 g/dL — ABNORMAL LOW (ref 13.0–17.0)
Potassium: 4.1 mmol/L (ref 3.5–5.1)
Sodium: 139 mmol/L (ref 135–145)
TCO2: 32 mmol/L (ref 22–32)

## 2023-02-04 LAB — GLUCOSE, CAPILLARY
Glucose-Capillary: 74 mg/dL (ref 70–99)
Glucose-Capillary: 93 mg/dL (ref 70–99)

## 2023-02-04 SURGERY — REVISION OF ARTERIOVENOUS GORETEX GRAFT
Anesthesia: General | Laterality: Left

## 2023-02-04 MED ORDER — 0.9 % SODIUM CHLORIDE (POUR BTL) OPTIME
TOPICAL | Status: DC | PRN
  Administered 2023-02-04: 1000 mL

## 2023-02-04 MED ORDER — OXYCODONE HCL 5 MG PO TABS: 5.0000 mg | ORAL_TABLET | Freq: Once | ORAL | Status: DC | PRN

## 2023-02-04 MED ORDER — LIDOCAINE 2% (20 MG/ML) 5 ML SYRINGE
INTRAMUSCULAR | Status: DC | PRN
  Administered 2023-02-04: 60 mg via INTRAVENOUS

## 2023-02-04 MED ORDER — ONDANSETRON HCL 4 MG/2ML IJ SOLN: 4.0000 mg | Freq: Four times a day (QID) | INTRAMUSCULAR | Status: DC | PRN

## 2023-02-04 MED ORDER — ORAL CARE MOUTH RINSE: 15.0000 mL | Freq: Once | OROMUCOSAL | Status: AC

## 2023-02-04 MED ORDER — MIDAZOLAM HCL 2 MG/2ML IJ SOLN
INTRAMUSCULAR | Status: AC
  Filled 2023-02-04: qty 2

## 2023-02-04 MED ORDER — CHLORHEXIDINE GLUCONATE 4 % EX SOLN: 60.0000 mL | Freq: Once | CUTANEOUS | Status: DC

## 2023-02-04 MED ORDER — PROPOFOL 10 MG/ML IV BOLUS
INTRAVENOUS | Status: DC | PRN
  Administered 2023-02-04: 200 mg via INTRAVENOUS
  Administered 2023-02-04: 30 mg via INTRAVENOUS
  Administered 2023-02-04: 50 mg via INTRAVENOUS

## 2023-02-04 MED ORDER — CEFAZOLIN SODIUM-DEXTROSE 2-4 GM/100ML-% IV SOLN
INTRAVENOUS | Status: AC
  Filled 2023-02-04: qty 100

## 2023-02-04 MED ORDER — HEPARIN 6000 UNIT IRRIGATION SOLUTION
Status: DC | PRN
  Administered 2023-02-04: 1

## 2023-02-04 MED ORDER — ONDANSETRON HCL 4 MG/2ML IJ SOLN
INTRAMUSCULAR | Status: DC | PRN
  Administered 2023-02-04: 4 mg via INTRAVENOUS

## 2023-02-04 MED ORDER — HEPARIN SODIUM (PORCINE) 1000 UNIT/ML IJ SOLN
INTRAMUSCULAR | Status: AC
  Filled 2023-02-04: qty 10

## 2023-02-04 MED ORDER — OXYCODONE-ACETAMINOPHEN 5-325 MG PO TABS: 1.0000 | ORAL_TABLET | Freq: Four times a day (QID) | ORAL | 0 refills | Status: DC | PRN

## 2023-02-04 MED ORDER — FENTANYL CITRATE (PF) 100 MCG/2ML IJ SOLN
25.0000 ug | INTRAMUSCULAR | Status: DC | PRN
  Administered 2023-02-04: 50 ug via INTRAVENOUS

## 2023-02-04 MED ORDER — DEXMEDETOMIDINE HCL IN NACL 200 MCG/50ML IV SOLN
INTRAVENOUS | Status: DC | PRN
  Administered 2023-02-04: 8 ug via INTRAVENOUS

## 2023-02-04 MED ORDER — CHLORHEXIDINE GLUCONATE 0.12 % MT SOLN
OROMUCOSAL | Status: AC
  Administered 2023-02-04: 15 mL via OROMUCOSAL
  Filled 2023-02-04: qty 15

## 2023-02-04 MED ORDER — PROPOFOL 10 MG/ML IV BOLUS
INTRAVENOUS | Status: AC
  Filled 2023-02-04: qty 20

## 2023-02-04 MED ORDER — CEFAZOLIN SODIUM-DEXTROSE 2-4 GM/100ML-% IV SOLN
2.0000 g | INTRAVENOUS | Status: AC
  Administered 2023-02-04: 2 g via INTRAVENOUS

## 2023-02-04 MED ORDER — CHLORHEXIDINE GLUCONATE 0.12 % MT SOLN: 15.0000 mL | Freq: Once | OROMUCOSAL | Status: AC

## 2023-02-04 MED ORDER — SODIUM CHLORIDE 0.9 % IV SOLN: INTRAVENOUS | Status: DC

## 2023-02-04 MED ORDER — FENTANYL CITRATE (PF) 250 MCG/5ML IJ SOLN
INTRAMUSCULAR | Status: DC | PRN
  Administered 2023-02-04 (×2): 25 ug via INTRAVENOUS
  Administered 2023-02-04 (×2): 50 ug via INTRAVENOUS

## 2023-02-04 MED ORDER — HEPARIN SODIUM (PORCINE) 1000 UNIT/ML IJ SOLN
INTRAMUSCULAR | Status: DC | PRN
  Administered 2023-02-04: 3200 [IU]

## 2023-02-04 MED ORDER — DEXAMETHASONE SODIUM PHOSPHATE 10 MG/ML IJ SOLN
INTRAMUSCULAR | Status: DC | PRN
  Administered 2023-02-04: 5 mg via INTRAVENOUS

## 2023-02-04 MED ORDER — FENTANYL CITRATE (PF) 100 MCG/2ML IJ SOLN
INTRAMUSCULAR | Status: AC
  Filled 2023-02-04: qty 2

## 2023-02-04 MED ORDER — PROPOFOL 500 MG/50ML IV EMUL
INTRAVENOUS | Status: DC | PRN
  Administered 2023-02-04: 50 ug/kg/min via INTRAVENOUS

## 2023-02-04 MED ORDER — HEPARIN 6000 UNIT IRRIGATION SOLUTION
Status: AC
  Filled 2023-02-04: qty 500

## 2023-02-04 MED ORDER — OXYCODONE HCL 5 MG/5ML PO SOLN: 5.0000 mg | Freq: Once | ORAL | Status: DC | PRN

## 2023-02-04 MED ORDER — FENTANYL CITRATE (PF) 250 MCG/5ML IJ SOLN
INTRAMUSCULAR | Status: AC
  Filled 2023-02-04: qty 5

## 2023-02-04 MED ORDER — PHENYLEPHRINE 80 MCG/ML (10ML) SYRINGE FOR IV PUSH (FOR BLOOD PRESSURE SUPPORT)
PREFILLED_SYRINGE | INTRAVENOUS | Status: DC | PRN
  Administered 2023-02-04 (×2): 160 ug via INTRAVENOUS
  Administered 2023-02-04: 80 ug via INTRAVENOUS

## 2023-02-04 SURGICAL SUPPLY — 58 items
ADH SKN CLS APL DERMABOND .7 (GAUZE/BANDAGES/DRESSINGS) ×4
ARMBAND PINK RESTRICT EXTREMIT (MISCELLANEOUS) ×6 IMPLANT
BAG COUNTER SPONGE SURGICOUNT (BAG) ×3 IMPLANT
BAG DECANTER FOR FLEXI CONT (MISCELLANEOUS) ×3 IMPLANT
BAG SPNG CNTER NS LX DISP (BAG) ×2
BIOPATCH RED 1 DISK 7.0 (GAUZE/BANDAGES/DRESSINGS) ×3 IMPLANT
CANISTER SUCT 3000ML PPV (MISCELLANEOUS) ×3 IMPLANT
CATH PALINDROME-P 19CM W/VT (CATHETERS) IMPLANT
CATH PALINDROME-P 23CM W/VT (CATHETERS) IMPLANT
CATH PALINDROME-P 28CM W/VT (CATHETERS) IMPLANT
CLIP LIGATING EXTRA MED SLVR (CLIP) ×3 IMPLANT
CLIP LIGATING EXTRA SM BLUE (MISCELLANEOUS) ×3 IMPLANT
COVER PROBE W GEL 5X96 (DRAPES) ×3 IMPLANT
COVER SURGICAL LIGHT HANDLE (MISCELLANEOUS) ×3 IMPLANT
DERMABOND ADVANCED .7 DNX12 (GAUZE/BANDAGES/DRESSINGS) ×3 IMPLANT
DRAPE C-ARM 42X72 X-RAY (DRAPES) ×3 IMPLANT
DRAPE CHEST BREAST 15X10 FENES (DRAPES) ×3 IMPLANT
ELECT REM PT RETURN 9FT ADLT (ELECTROSURGICAL) ×2
ELECTRODE REM PT RTRN 9FT ADLT (ELECTROSURGICAL) ×3 IMPLANT
GAUZE 4X4 16PLY ~~LOC~~+RFID DBL (SPONGE) ×3 IMPLANT
GLOVE BIO SURGEON STRL SZ7.5 (GLOVE) ×3 IMPLANT
GOWN STRL REUS W/ TWL LRG LVL3 (GOWN DISPOSABLE) ×6 IMPLANT
GOWN STRL REUS W/ TWL XL LVL3 (GOWN DISPOSABLE) ×3 IMPLANT
GOWN STRL REUS W/TWL LRG LVL3 (GOWN DISPOSABLE) ×4
GOWN STRL REUS W/TWL XL LVL3 (GOWN DISPOSABLE) ×2
GRAFT VASCULAR 7X40 (Vascular Products) IMPLANT
INSERT FOGARTY SM (MISCELLANEOUS) ×3 IMPLANT
KIT BASIN OR (CUSTOM PROCEDURE TRAY) ×3 IMPLANT
KIT PALINDROME-P 55CM (CATHETERS) IMPLANT
KIT TURNOVER KIT B (KITS) ×3 IMPLANT
NDL 18GX1X1/2 (RX/OR ONLY) (NEEDLE) ×3 IMPLANT
NDL HYPO 25GX1X1/2 BEV (NEEDLE) ×3 IMPLANT
NEEDLE 18GX1X1/2 (RX/OR ONLY) (NEEDLE) ×2
NEEDLE HYPO 25GX1X1/2 BEV (NEEDLE) ×2
NS IRRIG 1000ML POUR BTL (IV SOLUTION) ×3 IMPLANT
PACK BASIC III (CUSTOM PROCEDURE TRAY) ×2
PACK CV ACCESS (CUSTOM PROCEDURE TRAY) ×3 IMPLANT
PACK SRG BSC III STRL LF ECLPS (CUSTOM PROCEDURE TRAY) ×3 IMPLANT
PAD ARMBOARD 7.5X6 YLW CONV (MISCELLANEOUS) ×6 IMPLANT
POWDER SURGICEL 3.0 GRAM (HEMOSTASIS) IMPLANT
SOAP 2 % CHG 4 OZ (WOUND CARE) ×3 IMPLANT
SUT ETHILON 3 0 PS 1 (SUTURE) ×3 IMPLANT
SUT GORETEX 6.0 TH-9 30 IN (SUTURE) ×3 IMPLANT
SUT GORETEX CV-6TTC-13 36IN (SUTURE) ×3 IMPLANT
SUT MNCRL AB 4-0 PS2 18 (SUTURE) ×3 IMPLANT
SUT PROLENE 5 0 C 1 24 (SUTURE) IMPLANT
SUT PROLENE 6 0 BV (SUTURE) ×6 IMPLANT
SUT SILK 2 0 SH (SUTURE) IMPLANT
SUT VIC AB 3-0 SH 27 (SUTURE) ×6
SUT VIC AB 3-0 SH 27X BRD (SUTURE) ×6 IMPLANT
SYR 10ML LL (SYRINGE) ×3 IMPLANT
SYR 20ML LL LF (SYRINGE) ×6 IMPLANT
SYR 5ML LL (SYRINGE) ×3 IMPLANT
SYR CONTROL 10ML LL (SYRINGE) ×3 IMPLANT
TOWEL GREEN STERILE (TOWEL DISPOSABLE) ×3 IMPLANT
TOWEL GREEN STERILE FF (TOWEL DISPOSABLE) ×6 IMPLANT
UNDERPAD 30X36 HEAVY ABSORB (UNDERPADS AND DIAPERS) ×3 IMPLANT
WATER STERILE IRR 1000ML POUR (IV SOLUTION) ×3 IMPLANT

## 2023-02-04 NOTE — Transfer of Care (Signed)
Immediate Anesthesia Transfer of Care Note  Patient: Christopher Lopez  Procedure(s) Performed: REVISION OF LEFT ARM ARTERIOVENOUS GORETEX GRAFT USING 7mm GORETEX STRETCH GRAFT (Left) INSERTION OF TUNNELED DIALYSIS CATHETER (Left) EXCISION PSEUDOANEURYSM LEFT ARM  Patient Location: PACU  Anesthesia Type:General  Level of Consciousness: drowsy and responds to stimulation  Airway & Oxygen Therapy: Patient Spontanous Breathing and Patient connected to face mask oxygen  Post-op Assessment: Report given to RN and Post -op Vital signs reviewed and stable  Post vital signs: Reviewed and stable  Last Vitals:  Vitals Value Taken Time  BP 139/81 02/04/23 1427  Temp    Pulse 86 02/04/23 1429  Resp 8 02/04/23 1429  SpO2 100 % 02/04/23 1429  Vitals shown include unfiled device data.  Last Pain:  Vitals:   02/04/23 0955  TempSrc:   PainSc: 0-No pain         Complications: No notable events documented.

## 2023-02-04 NOTE — Interval H&P Note (Signed)
History and Physical Interval Note:  02/04/2023 11:25 AM  Christopher Lopez  has presented today for surgery, with the diagnosis of ESRD.  The various methods of treatment have been discussed with the patient and family. After consideration of risks, benefits and other options for treatment, the patient has consented to  Procedure(s): REVISION OF LEFT ARM ARTERIOVENOUS GORETEX GRAFT (Left) INSERTION OF TUNNELED DIALYSIS CATHETER (N/A) as a surgical intervention.  The patient's history has been reviewed, patient examined, no change in status, stable for surgery.  I have reviewed the patient's chart and labs.  Questions were answered to the patient's satisfaction.     Lemar Livings

## 2023-02-04 NOTE — Op Note (Signed)
Patient name: Christopher Lopez MRN: [id number] DOB: Nov 14, 1960 Sex: male  02/04/2023 Pre-operative Diagnosis: End-stage renal disease, malfunctioning left arm AV fistula Post-operative diagnosis:  Same Surgeon:  Luanna Salk. Randie Heinz, MD Assistant: Nathanial Rancher, PA Procedure Performed: 1.  Revision left arm AV graft with interposition 7 mm stretch PTFE 2.  Excision left upper arm pseudoaneurysm 3.  Placement of 19 cm left IJ tunneled dialysis catheter with ultrasound and fluoroscopic guidance  Indications: 62 year old male on dialysis via left arm AV graft that has had issues with pseudoaneurysms and excessive bleeding as well as serous drainage from the pseudoaneurysm sites.  He is now indicated for revision of the AV graft conversion to new AV graft.  Experience assistant was necessary to facilitate exposure of the AV graft and performing new anastomosis.  Findings: Near the arterial anastomosis there was a very large pseudoaneurysm that was excised and was dense calcification within the pseudoaneurysm cavity and also significant chronic thrombus that was all removed.  The graft was identified there was no evidence of neck of the pseudoaneurysm there was an area where the graft had previously been sutured which was made aware of the pseudoaneurysm arose from.  He started a new graft tunneled laterally to the old graft and both proximally and distally and at completion there was a very strong thrill in the graft and a strong Doppler signal distally in the axillary vein and also palpable radial artery pulse at the wrist confirmed with Doppler.   Procedure:  The patient was identified in the holding area and taken to the operating was placed supine operative table and LMA anesthesia was induced.  He was sterilely prepped and draped in the left upper extremity bilateral neck and chest area in the usual fashion, antibiotics were administered timeout was called.  We began using ultrasound the right IJ  appears very diminutive and the left IJ was then cannulated with direct ultrasound visualization with an 18-gauge needle followed by wire passed centrally with fluoroscopic guidance.  19 cm catheter was tunneled from a counterincision.  The wire tract was serially dilated and introducer sheath was placed with direct fluoroscopic visualization and the catheter was then placed into the SVC.  There was noted to have smooth curves with areas of kinking.  It was flushed with heparinized saline and locked with concentrated heparinized port.  This was fixed in the skin with 3-0 nylon suture in the neck access site was closed with 4-0 Monocryl and Dermabond was placed at both sites.  Attention was then turned to the left upper extremity.  Ultrasound was used to identify what appeared to be a very large pseudoaneurysm like to see the graft within the pseudoaneurysm near the antecubitum.  I also identified the graft towards the axillary incision.  First open to be antecubital incision and dissected down to a very large pseudoaneurysm cavity which was filled with calcium and chronic thrombus.  This was all removed.  I excised the pseudoaneurysm cavity back until I can see the graft.  I did not see any opening leading into the pseudoaneurysm cavity and there was no bleeding.  The axillary incision was then opened dissected down to the graft and traced this for several centimeters placed a vessel around this.  Similarly a vessel loop was placed around the graft and the antecubital incision.  A new graft was tunneled laterally to the old graft.  At this time I clamped the graft near the arterial anastomosis as well as the venous  anastomosis.  Transected a few centimeters past the arterial anastomosis.  The new graft was sewn end-to-end with 6-0 Prolene suture.  Upon completion we then flushed through the graft and then flushed with heparinized saline and clamped the new graft with Fogarty Hydro grip clamp.  The graft was then  transected in the axilla and the graft was trimmed to size and sewn end-to-end with 6-0 Prolene suture again.  Prior completion without flushing all directions.  Appointment patient has very strong thrill in the graft confirmed with Doppler in the axillary vein and a palpable radial artery pulse at the wrist also confirmed with Doppler.  We then dissected back in resected as much of the old graft as possible.  Only irrigated and hemostasis was obtained and we closed in layers with Vicryl and Monocryl.  Patient was awakened from anesthesia having tolerated the procedure without any complication.  All counts were correct at operation.  EBL: 100 cc    Nevah Dalal C. Randie Heinz, MD [facility] [place] Office: [phone] Pager: [phone]

## 2023-02-04 NOTE — Discharge Instructions (Signed)
[facility]  Discharge Instructions  AV Fistula or Graft Surgery for Dialysis Access  Please refer to the following instructions for your post-procedure care. Your surgeon or physician assistant will discuss any changes with you.  Activity  You may drive the day following your surgery, if you are comfortable and no longer taking prescription pain medication. Resume full activity as the soreness in your incision resolves.  Bathing/Showering  You may shower after you go home. Keep your incision dry for 48 hours. Do not soak in a bathtub, hot tub, or swim until the incision heals completely. You may not shower if you have a hemodialysis catheter.  Incision Care  Clean your incision with mild soap and water after 48 hours. Pat the area dry with a clean towel. You do not need a bandage unless otherwise instructed. Do not apply any ointments or creams to your incision. You may have skin glue on your incision. Do not peel it off. It will come off on its own in about one week. Your arm may swell a bit after surgery. To reduce swelling use pillows to elevate your arm so it is above your heart. Your doctor will tell you if you need to lightly wrap your arm with an ACE bandage.  Diet  Resume your normal diet. There are not special food restrictions following this procedure. In order to heal from your surgery, it is CRITICAL to get adequate nutrition. Your body requires vitamins, minerals, and protein. Vegetables are the best source of vitamins and minerals. Vegetables also provide the perfect balance of protein. Processed food has little nutritional value, so try to avoid this.  Medications  Resume taking all of your medications. If your incision is causing pain, you may take over-the counter pain relievers such as acetaminophen (Tylenol). If you were prescribed a stronger pain medication, please be aware these medications can cause nausea and constipation. Prevent  nausea by taking the medication with a snack or meal. Avoid constipation by drinking plenty of fluids and eating foods with high amount of fiber, such as fruits, vegetables, and grains.  Do not take Tylenol if you are taking prescription pain medications.  Follow up Your surgeon may want to see you in the office following your access surgery. If so, this will be arranged at the time of your surgery.  Please call us immediately for any of the following conditions:  Increased pain, redness, drainage (pus) from your incision site Fever of 101 degrees or higher Severe or worsening pain at your incision site Hand pain or numbness.  Reduce your risk of vascular disease:  Stop smoking. If you would like help, call [facility] at 1-800-QUIT-NOW (612-559-7684) or [place] at [phone]  Manage your cholesterol Maintain a desired weight Control your diabetes Keep your blood pressure down  Dialysis  It will take several weeks to several months for your new dialysis access to be ready for use. Your surgeon will determine when it is okay to use it. Your nephrologist will continue to direct your dialysis. You can continue to use your Permcath until your new access is ready for use.   02/04/2023 Christopher Lopez [id number] 09/09/1960  Surgeon(s): Maeola Harman, MD  Procedure(s): REVISION OF LEFT ARM ARTERIOVENOUS GORETEX GRAFT USING 7mm GORETEX STRETCH GRAFT INSERTION OF TUNNELED DIALYSIS CATHETER EXCISION PSEUDOANEURYSM LEFT ARM   May stick graft immediately   May stick graft on designated area only:   X Do not stick Left AV Graft  for 4 weeks    If you have any questions, please call the office at [phone].

## 2023-02-04 NOTE — Anesthesia Procedure Notes (Signed)
Procedure Name: LMA Insertion Date/Time: 02/04/2023 12:41 PM  Performed by: Loleta Raschelle Wisenbaker, CRNAPre-anesthesia Checklist: Patient identified, Patient being monitored, Timeout performed, Emergency Drugs available and Suction available Patient Re-evaluated:Patient Re-evaluated prior to induction Oxygen Delivery Method: Circle system utilized Preoxygenation: Pre-oxygenation with 100% oxygen Induction Type: IV induction Ventilation: Mask ventilation without difficulty and Oral airway inserted - appropriate to patient size LMA: LMA inserted LMA Size: 4.0 Tube type: Oral Number of attempts: 1 Placement Confirmation: positive ETCO2 and breath sounds checked- equal and bilateral Tube secured with: Tape Dental Injury: Teeth and Oropharynx as per pre-operative assessment

## 2023-02-05 ENCOUNTER — Encounter (HOSPITAL_COMMUNITY): Payer: Self-pay | Admitting: Vascular Surgery

## 2023-02-05 DIAGNOSIS — N186 End stage renal disease: Secondary | ICD-10-CM | POA: Diagnosis not present

## 2023-02-05 DIAGNOSIS — S51802S Unspecified open wound of left forearm, sequela: Secondary | ICD-10-CM | POA: Diagnosis not present

## 2023-02-05 DIAGNOSIS — D631 Anemia in chronic kidney disease: Secondary | ICD-10-CM | POA: Diagnosis not present

## 2023-02-05 DIAGNOSIS — N2581 Secondary hyperparathyroidism of renal origin: Secondary | ICD-10-CM | POA: Diagnosis not present

## 2023-02-05 DIAGNOSIS — E1129 Type 2 diabetes mellitus with other diabetic kidney complication: Secondary | ICD-10-CM | POA: Diagnosis not present

## 2023-02-05 DIAGNOSIS — Z992 Dependence on renal dialysis: Secondary | ICD-10-CM | POA: Diagnosis not present

## 2023-02-05 DIAGNOSIS — D689 Coagulation defect, unspecified: Secondary | ICD-10-CM | POA: Diagnosis not present

## 2023-02-05 NOTE — Anesthesia Postprocedure Evaluation (Signed)
Anesthesia Post Note  Patient: Christopher Lopez  Procedure(Lopez) Performed: REVISION OF LEFT ARM ARTERIOVENOUS GORETEX GRAFT USING 7mm GORETEX STRETCH GRAFT (Left) INSERTION OF TUNNELED DIALYSIS CATHETER (Left) EXCISION PSEUDOANEURYSM LEFT ARM     Patient location during evaluation: PACU Anesthesia Type: General Level of consciousness: awake and alert Pain management: pain level controlled Vital Signs Assessment: post-procedure vital signs reviewed and stable Respiratory status: spontaneous breathing, nonlabored ventilation, respiratory function stable and patient connected to nasal cannula oxygen Cardiovascular status: blood pressure returned to baseline and stable Postop Assessment: no apparent nausea or vomiting Anesthetic complications: no   No notable events documented.  Last Vitals:  Vitals:   02/04/23 1500 02/04/23 1515  BP: 136/82 127/75  Pulse: 83 81  Resp: 10 10  Temp:  36.6 C  SpO2: 95% 96%    Last Pain:  Vitals:   02/04/23 1515  TempSrc:   PainSc: 3                  Christopher Lopez

## 2023-02-08 DIAGNOSIS — Z992 Dependence on renal dialysis: Secondary | ICD-10-CM | POA: Diagnosis not present

## 2023-02-08 DIAGNOSIS — D689 Coagulation defect, unspecified: Secondary | ICD-10-CM | POA: Diagnosis not present

## 2023-02-08 DIAGNOSIS — N186 End stage renal disease: Secondary | ICD-10-CM | POA: Diagnosis not present

## 2023-02-08 DIAGNOSIS — S51802S Unspecified open wound of left forearm, sequela: Secondary | ICD-10-CM | POA: Diagnosis not present

## 2023-02-08 DIAGNOSIS — D631 Anemia in chronic kidney disease: Secondary | ICD-10-CM | POA: Diagnosis not present

## 2023-02-08 DIAGNOSIS — E1129 Type 2 diabetes mellitus with other diabetic kidney complication: Secondary | ICD-10-CM | POA: Diagnosis not present

## 2023-02-08 DIAGNOSIS — N2581 Secondary hyperparathyroidism of renal origin: Secondary | ICD-10-CM | POA: Diagnosis not present

## 2023-02-09 DIAGNOSIS — N186 End stage renal disease: Secondary | ICD-10-CM | POA: Diagnosis not present

## 2023-02-09 DIAGNOSIS — E1122 Type 2 diabetes mellitus with diabetic chronic kidney disease: Secondary | ICD-10-CM | POA: Diagnosis not present

## 2023-02-09 DIAGNOSIS — Z992 Dependence on renal dialysis: Secondary | ICD-10-CM | POA: Diagnosis not present

## 2023-02-10 DIAGNOSIS — E1129 Type 2 diabetes mellitus with other diabetic kidney complication: Secondary | ICD-10-CM | POA: Diagnosis not present

## 2023-02-10 DIAGNOSIS — N2581 Secondary hyperparathyroidism of renal origin: Secondary | ICD-10-CM | POA: Diagnosis not present

## 2023-02-10 DIAGNOSIS — D689 Coagulation defect, unspecified: Secondary | ICD-10-CM | POA: Diagnosis not present

## 2023-02-10 DIAGNOSIS — Z992 Dependence on renal dialysis: Secondary | ICD-10-CM | POA: Diagnosis not present

## 2023-02-10 DIAGNOSIS — D631 Anemia in chronic kidney disease: Secondary | ICD-10-CM | POA: Diagnosis not present

## 2023-02-10 DIAGNOSIS — N186 End stage renal disease: Secondary | ICD-10-CM | POA: Diagnosis not present

## 2023-02-12 DIAGNOSIS — N186 End stage renal disease: Secondary | ICD-10-CM | POA: Diagnosis not present

## 2023-02-12 DIAGNOSIS — E1129 Type 2 diabetes mellitus with other diabetic kidney complication: Secondary | ICD-10-CM | POA: Diagnosis not present

## 2023-02-12 DIAGNOSIS — D689 Coagulation defect, unspecified: Secondary | ICD-10-CM | POA: Diagnosis not present

## 2023-02-12 DIAGNOSIS — N2581 Secondary hyperparathyroidism of renal origin: Secondary | ICD-10-CM | POA: Diagnosis not present

## 2023-02-12 DIAGNOSIS — D631 Anemia in chronic kidney disease: Secondary | ICD-10-CM | POA: Diagnosis not present

## 2023-02-12 DIAGNOSIS — Z992 Dependence on renal dialysis: Secondary | ICD-10-CM | POA: Diagnosis not present

## 2023-02-15 DIAGNOSIS — D631 Anemia in chronic kidney disease: Secondary | ICD-10-CM | POA: Diagnosis not present

## 2023-02-15 DIAGNOSIS — N186 End stage renal disease: Secondary | ICD-10-CM | POA: Diagnosis not present

## 2023-02-15 DIAGNOSIS — Z992 Dependence on renal dialysis: Secondary | ICD-10-CM | POA: Diagnosis not present

## 2023-02-15 DIAGNOSIS — E1129 Type 2 diabetes mellitus with other diabetic kidney complication: Secondary | ICD-10-CM | POA: Diagnosis not present

## 2023-02-15 DIAGNOSIS — D689 Coagulation defect, unspecified: Secondary | ICD-10-CM | POA: Diagnosis not present

## 2023-02-15 DIAGNOSIS — N2581 Secondary hyperparathyroidism of renal origin: Secondary | ICD-10-CM | POA: Diagnosis not present

## 2023-02-17 DIAGNOSIS — N186 End stage renal disease: Secondary | ICD-10-CM | POA: Diagnosis not present

## 2023-02-17 DIAGNOSIS — Z992 Dependence on renal dialysis: Secondary | ICD-10-CM | POA: Diagnosis not present

## 2023-02-17 DIAGNOSIS — D689 Coagulation defect, unspecified: Secondary | ICD-10-CM | POA: Diagnosis not present

## 2023-02-17 DIAGNOSIS — N2581 Secondary hyperparathyroidism of renal origin: Secondary | ICD-10-CM | POA: Diagnosis not present

## 2023-02-17 DIAGNOSIS — D631 Anemia in chronic kidney disease: Secondary | ICD-10-CM | POA: Diagnosis not present

## 2023-02-17 DIAGNOSIS — E1129 Type 2 diabetes mellitus with other diabetic kidney complication: Secondary | ICD-10-CM | POA: Diagnosis not present

## 2023-02-19 DIAGNOSIS — D689 Coagulation defect, unspecified: Secondary | ICD-10-CM | POA: Diagnosis not present

## 2023-02-19 DIAGNOSIS — E1129 Type 2 diabetes mellitus with other diabetic kidney complication: Secondary | ICD-10-CM | POA: Diagnosis not present

## 2023-02-19 DIAGNOSIS — D631 Anemia in chronic kidney disease: Secondary | ICD-10-CM | POA: Diagnosis not present

## 2023-02-19 DIAGNOSIS — Z992 Dependence on renal dialysis: Secondary | ICD-10-CM | POA: Diagnosis not present

## 2023-02-19 DIAGNOSIS — N2581 Secondary hyperparathyroidism of renal origin: Secondary | ICD-10-CM | POA: Diagnosis not present

## 2023-02-19 DIAGNOSIS — N186 End stage renal disease: Secondary | ICD-10-CM | POA: Diagnosis not present

## 2023-02-22 DIAGNOSIS — E1129 Type 2 diabetes mellitus with other diabetic kidney complication: Secondary | ICD-10-CM | POA: Diagnosis not present

## 2023-02-22 DIAGNOSIS — N2581 Secondary hyperparathyroidism of renal origin: Secondary | ICD-10-CM | POA: Diagnosis not present

## 2023-02-22 DIAGNOSIS — D689 Coagulation defect, unspecified: Secondary | ICD-10-CM | POA: Diagnosis not present

## 2023-02-22 DIAGNOSIS — N186 End stage renal disease: Secondary | ICD-10-CM | POA: Diagnosis not present

## 2023-02-22 DIAGNOSIS — Z992 Dependence on renal dialysis: Secondary | ICD-10-CM | POA: Diagnosis not present

## 2023-02-22 DIAGNOSIS — D631 Anemia in chronic kidney disease: Secondary | ICD-10-CM | POA: Diagnosis not present

## 2023-02-24 DIAGNOSIS — N186 End stage renal disease: Secondary | ICD-10-CM | POA: Diagnosis not present

## 2023-02-24 DIAGNOSIS — D689 Coagulation defect, unspecified: Secondary | ICD-10-CM | POA: Diagnosis not present

## 2023-02-24 DIAGNOSIS — D631 Anemia in chronic kidney disease: Secondary | ICD-10-CM | POA: Diagnosis not present

## 2023-02-24 DIAGNOSIS — Z992 Dependence on renal dialysis: Secondary | ICD-10-CM | POA: Diagnosis not present

## 2023-02-24 DIAGNOSIS — E1129 Type 2 diabetes mellitus with other diabetic kidney complication: Secondary | ICD-10-CM | POA: Diagnosis not present

## 2023-02-24 DIAGNOSIS — N2581 Secondary hyperparathyroidism of renal origin: Secondary | ICD-10-CM | POA: Diagnosis not present

## 2023-02-26 DIAGNOSIS — D631 Anemia in chronic kidney disease: Secondary | ICD-10-CM | POA: Diagnosis not present

## 2023-02-26 DIAGNOSIS — E1129 Type 2 diabetes mellitus with other diabetic kidney complication: Secondary | ICD-10-CM | POA: Diagnosis not present

## 2023-02-26 DIAGNOSIS — D689 Coagulation defect, unspecified: Secondary | ICD-10-CM | POA: Diagnosis not present

## 2023-02-26 DIAGNOSIS — N2581 Secondary hyperparathyroidism of renal origin: Secondary | ICD-10-CM | POA: Diagnosis not present

## 2023-02-26 DIAGNOSIS — N186 End stage renal disease: Secondary | ICD-10-CM | POA: Diagnosis not present

## 2023-02-26 DIAGNOSIS — Z992 Dependence on renal dialysis: Secondary | ICD-10-CM | POA: Diagnosis not present

## 2023-03-01 DIAGNOSIS — Z992 Dependence on renal dialysis: Secondary | ICD-10-CM | POA: Diagnosis not present

## 2023-03-01 DIAGNOSIS — D689 Coagulation defect, unspecified: Secondary | ICD-10-CM | POA: Diagnosis not present

## 2023-03-01 DIAGNOSIS — E1129 Type 2 diabetes mellitus with other diabetic kidney complication: Secondary | ICD-10-CM | POA: Diagnosis not present

## 2023-03-01 DIAGNOSIS — D631 Anemia in chronic kidney disease: Secondary | ICD-10-CM | POA: Diagnosis not present

## 2023-03-01 DIAGNOSIS — N2581 Secondary hyperparathyroidism of renal origin: Secondary | ICD-10-CM | POA: Diagnosis not present

## 2023-03-01 DIAGNOSIS — N186 End stage renal disease: Secondary | ICD-10-CM | POA: Diagnosis not present

## 2023-03-03 DIAGNOSIS — N2581 Secondary hyperparathyroidism of renal origin: Secondary | ICD-10-CM | POA: Diagnosis not present

## 2023-03-03 DIAGNOSIS — Z992 Dependence on renal dialysis: Secondary | ICD-10-CM | POA: Diagnosis not present

## 2023-03-03 DIAGNOSIS — E1129 Type 2 diabetes mellitus with other diabetic kidney complication: Secondary | ICD-10-CM | POA: Diagnosis not present

## 2023-03-03 DIAGNOSIS — D689 Coagulation defect, unspecified: Secondary | ICD-10-CM | POA: Diagnosis not present

## 2023-03-03 DIAGNOSIS — N186 End stage renal disease: Secondary | ICD-10-CM | POA: Diagnosis not present

## 2023-03-03 DIAGNOSIS — D631 Anemia in chronic kidney disease: Secondary | ICD-10-CM | POA: Diagnosis not present

## 2023-03-05 DIAGNOSIS — E1129 Type 2 diabetes mellitus with other diabetic kidney complication: Secondary | ICD-10-CM | POA: Diagnosis not present

## 2023-03-05 DIAGNOSIS — Z992 Dependence on renal dialysis: Secondary | ICD-10-CM | POA: Diagnosis not present

## 2023-03-05 DIAGNOSIS — N2581 Secondary hyperparathyroidism of renal origin: Secondary | ICD-10-CM | POA: Diagnosis not present

## 2023-03-05 DIAGNOSIS — D631 Anemia in chronic kidney disease: Secondary | ICD-10-CM | POA: Diagnosis not present

## 2023-03-05 DIAGNOSIS — N186 End stage renal disease: Secondary | ICD-10-CM | POA: Diagnosis not present

## 2023-03-05 DIAGNOSIS — D689 Coagulation defect, unspecified: Secondary | ICD-10-CM | POA: Diagnosis not present

## 2023-03-08 DIAGNOSIS — D631 Anemia in chronic kidney disease: Secondary | ICD-10-CM | POA: Diagnosis not present

## 2023-03-08 DIAGNOSIS — N2581 Secondary hyperparathyroidism of renal origin: Secondary | ICD-10-CM | POA: Diagnosis not present

## 2023-03-08 DIAGNOSIS — E1129 Type 2 diabetes mellitus with other diabetic kidney complication: Secondary | ICD-10-CM | POA: Diagnosis not present

## 2023-03-08 DIAGNOSIS — Z992 Dependence on renal dialysis: Secondary | ICD-10-CM | POA: Diagnosis not present

## 2023-03-08 DIAGNOSIS — N186 End stage renal disease: Secondary | ICD-10-CM | POA: Diagnosis not present

## 2023-03-08 DIAGNOSIS — D689 Coagulation defect, unspecified: Secondary | ICD-10-CM | POA: Diagnosis not present

## 2023-03-09 ENCOUNTER — Ambulatory Visit (INDEPENDENT_AMBULATORY_CARE_PROVIDER_SITE_OTHER): Payer: Medicare Other | Admitting: Physician Assistant

## 2023-03-09 VITALS — BP 164/85 | HR 85 | Temp 98.7°F | Wt 165.0 lb

## 2023-03-09 DIAGNOSIS — N186 End stage renal disease: Secondary | ICD-10-CM

## 2023-03-09 NOTE — Progress Notes (Signed)
  POST OPERATIVE OFFICE NOTE    CC:  F/u for surgery  HPI:  This is a 62 y.o. male who is s/p  Procedure Performed: 1.  Revision left arm AV graft with interposition 7 mm stretch PTFE 2.  Excision left upper arm pseudoaneurysm 3.  Placement of 19 cm left IJ tunneled dialysis catheter with ultrasound and fluoroscopic guidance   Pt returns today for follow up.  Pt states he has less edema in the left UE and no symptoms of steal.  He denies loss of sensation, motor or coolness.     Allergies  Allergen Reactions   Lipitor [Atorvastatin] Other (See Comments)    pain    Current Outpatient Medications  Medication Sig Dispense Refill   acetaminophen (TYLENOL) 650 MG CR tablet Take 1,300 mg by mouth every 8 (eight) hours as needed for pain.     aspirin 81 MG EC tablet Take 81 mg by mouth daily.     diphenhydrAMINE (BENADRYL) 25 mg capsule Take 75 mg by mouth daily as needed for itching.     Exenatide ER (BYDUREON BCISE) 2 MG/0.85ML AUIJ Inject 0.85 mLs into the skin once a week. 10.2 mL 1   glucose blood (ONE TOUCH ULTRA TEST) test strip Use as instructed once daily E11.9 100 each 12   Lancets (ONETOUCH ULTRASOFT) lancets 1 each by Other route 2 (two) times daily. Use to check blood sugars once a day Dx e11.9 100 each 3   metoprolol succinate (TOPROL-XL) 50 MG 24 hr tablet Take 1 tablet (50 mg total) by mouth daily. Take with or immediately following a meal. 90 tablet 3   pioglitazone (ACTOS) 15 MG tablet Take 1 tablet (15 mg total) by mouth daily. 90 tablet 1   rosuvastatin (CRESTOR) 20 MG tablet Take 1 tablet (20 mg total) by mouth daily. 90 tablet 3   VELPHORO 500 MG chewable tablet Chew 1,000 mg by mouth 2 (two) times daily.     Current Facility-Administered Medications  Medication Dose Route Frequency Provider Last Rate Last Admin   0.9 %  sodium chloride infusion  250 mL Intravenous PRN Maeola Harman, MD       sodium chloride flush (NS) 0.9 % injection 3 mL  3 mL  Intravenous Q12H Maeola Harman, MD         ROS:  See HPI  Physical Exam:    Incision:  Well healed  Extremities:   palpable/audible thrill in the graft. Palapble radial artery, compartments are soft, motor intact. Still has edema in the left UE including the hand.  He also has a working left internal jugular TDC.      Assessment/Plan:  This is a 62 y.o. male who is s/p Procedure Performed: 1.  Revision left arm AV graft with interposition 7 mm stretch PTFE 2.  Excision left upper arm pseudoaneurysm 3.  Placement of 19 cm left IJ tunneled dialysis catheter with ultrasound and fluoroscopic guidance   -The graft may be used as of 03/15/23 and as soon as it is working well the [facility] may be removed.  Removal of the [facility] may improve the edema in the left UE significantly.  He has concerns he will call our office.    Mosetta Pigeon PA-C [facility] [phone]   Clinic MD:  Randie Heinz

## 2023-03-10 DIAGNOSIS — Z992 Dependence on renal dialysis: Secondary | ICD-10-CM | POA: Diagnosis not present

## 2023-03-10 DIAGNOSIS — D631 Anemia in chronic kidney disease: Secondary | ICD-10-CM | POA: Diagnosis not present

## 2023-03-10 DIAGNOSIS — N2581 Secondary hyperparathyroidism of renal origin: Secondary | ICD-10-CM | POA: Diagnosis not present

## 2023-03-10 DIAGNOSIS — E1129 Type 2 diabetes mellitus with other diabetic kidney complication: Secondary | ICD-10-CM | POA: Diagnosis not present

## 2023-03-10 DIAGNOSIS — D689 Coagulation defect, unspecified: Secondary | ICD-10-CM | POA: Diagnosis not present

## 2023-03-10 DIAGNOSIS — N186 End stage renal disease: Secondary | ICD-10-CM | POA: Diagnosis not present

## 2023-03-12 DIAGNOSIS — D689 Coagulation defect, unspecified: Secondary | ICD-10-CM | POA: Diagnosis not present

## 2023-03-12 DIAGNOSIS — E1122 Type 2 diabetes mellitus with diabetic chronic kidney disease: Secondary | ICD-10-CM | POA: Diagnosis not present

## 2023-03-12 DIAGNOSIS — D631 Anemia in chronic kidney disease: Secondary | ICD-10-CM | POA: Diagnosis not present

## 2023-03-12 DIAGNOSIS — Z992 Dependence on renal dialysis: Secondary | ICD-10-CM | POA: Diagnosis not present

## 2023-03-12 DIAGNOSIS — N186 End stage renal disease: Secondary | ICD-10-CM | POA: Diagnosis not present

## 2023-03-12 DIAGNOSIS — E1129 Type 2 diabetes mellitus with other diabetic kidney complication: Secondary | ICD-10-CM | POA: Diagnosis not present

## 2023-03-12 DIAGNOSIS — N2581 Secondary hyperparathyroidism of renal origin: Secondary | ICD-10-CM | POA: Diagnosis not present

## 2023-03-15 DIAGNOSIS — N186 End stage renal disease: Secondary | ICD-10-CM | POA: Diagnosis not present

## 2023-03-15 DIAGNOSIS — D509 Iron deficiency anemia, unspecified: Secondary | ICD-10-CM | POA: Diagnosis not present

## 2023-03-15 DIAGNOSIS — D689 Coagulation defect, unspecified: Secondary | ICD-10-CM | POA: Diagnosis not present

## 2023-03-15 DIAGNOSIS — E1129 Type 2 diabetes mellitus with other diabetic kidney complication: Secondary | ICD-10-CM | POA: Diagnosis not present

## 2023-03-15 DIAGNOSIS — D631 Anemia in chronic kidney disease: Secondary | ICD-10-CM | POA: Diagnosis not present

## 2023-03-15 DIAGNOSIS — N2581 Secondary hyperparathyroidism of renal origin: Secondary | ICD-10-CM | POA: Diagnosis not present

## 2023-03-15 DIAGNOSIS — Z992 Dependence on renal dialysis: Secondary | ICD-10-CM | POA: Diagnosis not present

## 2023-03-17 DIAGNOSIS — D689 Coagulation defect, unspecified: Secondary | ICD-10-CM | POA: Diagnosis not present

## 2023-03-17 DIAGNOSIS — N2581 Secondary hyperparathyroidism of renal origin: Secondary | ICD-10-CM | POA: Diagnosis not present

## 2023-03-17 DIAGNOSIS — Z992 Dependence on renal dialysis: Secondary | ICD-10-CM | POA: Diagnosis not present

## 2023-03-17 DIAGNOSIS — D631 Anemia in chronic kidney disease: Secondary | ICD-10-CM | POA: Diagnosis not present

## 2023-03-17 DIAGNOSIS — D509 Iron deficiency anemia, unspecified: Secondary | ICD-10-CM | POA: Diagnosis not present

## 2023-03-17 DIAGNOSIS — N186 End stage renal disease: Secondary | ICD-10-CM | POA: Diagnosis not present

## 2023-03-17 DIAGNOSIS — E1129 Type 2 diabetes mellitus with other diabetic kidney complication: Secondary | ICD-10-CM | POA: Diagnosis not present

## 2023-03-19 DIAGNOSIS — D631 Anemia in chronic kidney disease: Secondary | ICD-10-CM | POA: Diagnosis not present

## 2023-03-19 DIAGNOSIS — D509 Iron deficiency anemia, unspecified: Secondary | ICD-10-CM | POA: Diagnosis not present

## 2023-03-19 DIAGNOSIS — D689 Coagulation defect, unspecified: Secondary | ICD-10-CM | POA: Diagnosis not present

## 2023-03-19 DIAGNOSIS — N2581 Secondary hyperparathyroidism of renal origin: Secondary | ICD-10-CM | POA: Diagnosis not present

## 2023-03-19 DIAGNOSIS — N186 End stage renal disease: Secondary | ICD-10-CM | POA: Diagnosis not present

## 2023-03-19 DIAGNOSIS — Z992 Dependence on renal dialysis: Secondary | ICD-10-CM | POA: Diagnosis not present

## 2023-03-19 DIAGNOSIS — E1129 Type 2 diabetes mellitus with other diabetic kidney complication: Secondary | ICD-10-CM | POA: Diagnosis not present

## 2023-03-22 DIAGNOSIS — D631 Anemia in chronic kidney disease: Secondary | ICD-10-CM | POA: Diagnosis not present

## 2023-03-22 DIAGNOSIS — N2581 Secondary hyperparathyroidism of renal origin: Secondary | ICD-10-CM | POA: Diagnosis not present

## 2023-03-22 DIAGNOSIS — D689 Coagulation defect, unspecified: Secondary | ICD-10-CM | POA: Diagnosis not present

## 2023-03-22 DIAGNOSIS — Z992 Dependence on renal dialysis: Secondary | ICD-10-CM | POA: Diagnosis not present

## 2023-03-22 DIAGNOSIS — D509 Iron deficiency anemia, unspecified: Secondary | ICD-10-CM | POA: Diagnosis not present

## 2023-03-22 DIAGNOSIS — N186 End stage renal disease: Secondary | ICD-10-CM | POA: Diagnosis not present

## 2023-03-22 DIAGNOSIS — E1129 Type 2 diabetes mellitus with other diabetic kidney complication: Secondary | ICD-10-CM | POA: Diagnosis not present

## 2023-03-24 ENCOUNTER — Other Ambulatory Visit: Payer: Self-pay | Admitting: Internal Medicine

## 2023-03-24 ENCOUNTER — Other Ambulatory Visit: Payer: Self-pay

## 2023-03-24 DIAGNOSIS — D689 Coagulation defect, unspecified: Secondary | ICD-10-CM | POA: Diagnosis not present

## 2023-03-24 DIAGNOSIS — N186 End stage renal disease: Secondary | ICD-10-CM | POA: Diagnosis not present

## 2023-03-24 DIAGNOSIS — E1129 Type 2 diabetes mellitus with other diabetic kidney complication: Secondary | ICD-10-CM | POA: Diagnosis not present

## 2023-03-24 DIAGNOSIS — Z992 Dependence on renal dialysis: Secondary | ICD-10-CM | POA: Diagnosis not present

## 2023-03-24 DIAGNOSIS — D509 Iron deficiency anemia, unspecified: Secondary | ICD-10-CM | POA: Diagnosis not present

## 2023-03-24 DIAGNOSIS — D631 Anemia in chronic kidney disease: Secondary | ICD-10-CM | POA: Diagnosis not present

## 2023-03-24 DIAGNOSIS — N2581 Secondary hyperparathyroidism of renal origin: Secondary | ICD-10-CM | POA: Diagnosis not present

## 2023-03-26 DIAGNOSIS — D631 Anemia in chronic kidney disease: Secondary | ICD-10-CM | POA: Diagnosis not present

## 2023-03-26 DIAGNOSIS — D509 Iron deficiency anemia, unspecified: Secondary | ICD-10-CM | POA: Diagnosis not present

## 2023-03-26 DIAGNOSIS — N186 End stage renal disease: Secondary | ICD-10-CM | POA: Diagnosis not present

## 2023-03-26 DIAGNOSIS — Z992 Dependence on renal dialysis: Secondary | ICD-10-CM | POA: Diagnosis not present

## 2023-03-26 DIAGNOSIS — D689 Coagulation defect, unspecified: Secondary | ICD-10-CM | POA: Diagnosis not present

## 2023-03-26 DIAGNOSIS — N2581 Secondary hyperparathyroidism of renal origin: Secondary | ICD-10-CM | POA: Diagnosis not present

## 2023-03-26 DIAGNOSIS — E1129 Type 2 diabetes mellitus with other diabetic kidney complication: Secondary | ICD-10-CM | POA: Diagnosis not present

## 2023-03-29 DIAGNOSIS — E1129 Type 2 diabetes mellitus with other diabetic kidney complication: Secondary | ICD-10-CM | POA: Diagnosis not present

## 2023-03-29 DIAGNOSIS — D631 Anemia in chronic kidney disease: Secondary | ICD-10-CM | POA: Diagnosis not present

## 2023-03-29 DIAGNOSIS — D509 Iron deficiency anemia, unspecified: Secondary | ICD-10-CM | POA: Diagnosis not present

## 2023-03-29 DIAGNOSIS — D689 Coagulation defect, unspecified: Secondary | ICD-10-CM | POA: Diagnosis not present

## 2023-03-29 DIAGNOSIS — Z992 Dependence on renal dialysis: Secondary | ICD-10-CM | POA: Diagnosis not present

## 2023-03-29 DIAGNOSIS — N186 End stage renal disease: Secondary | ICD-10-CM | POA: Diagnosis not present

## 2023-03-29 DIAGNOSIS — N2581 Secondary hyperparathyroidism of renal origin: Secondary | ICD-10-CM | POA: Diagnosis not present

## 2023-03-31 DIAGNOSIS — D631 Anemia in chronic kidney disease: Secondary | ICD-10-CM | POA: Diagnosis not present

## 2023-03-31 DIAGNOSIS — D509 Iron deficiency anemia, unspecified: Secondary | ICD-10-CM | POA: Diagnosis not present

## 2023-03-31 DIAGNOSIS — N2581 Secondary hyperparathyroidism of renal origin: Secondary | ICD-10-CM | POA: Diagnosis not present

## 2023-03-31 DIAGNOSIS — Z992 Dependence on renal dialysis: Secondary | ICD-10-CM | POA: Diagnosis not present

## 2023-03-31 DIAGNOSIS — N186 End stage renal disease: Secondary | ICD-10-CM | POA: Diagnosis not present

## 2023-03-31 DIAGNOSIS — D689 Coagulation defect, unspecified: Secondary | ICD-10-CM | POA: Diagnosis not present

## 2023-03-31 DIAGNOSIS — E1129 Type 2 diabetes mellitus with other diabetic kidney complication: Secondary | ICD-10-CM | POA: Diagnosis not present

## 2023-04-02 DIAGNOSIS — D631 Anemia in chronic kidney disease: Secondary | ICD-10-CM | POA: Diagnosis not present

## 2023-04-02 DIAGNOSIS — Z992 Dependence on renal dialysis: Secondary | ICD-10-CM | POA: Diagnosis not present

## 2023-04-02 DIAGNOSIS — D689 Coagulation defect, unspecified: Secondary | ICD-10-CM | POA: Diagnosis not present

## 2023-04-02 DIAGNOSIS — N2581 Secondary hyperparathyroidism of renal origin: Secondary | ICD-10-CM | POA: Diagnosis not present

## 2023-04-02 DIAGNOSIS — E1129 Type 2 diabetes mellitus with other diabetic kidney complication: Secondary | ICD-10-CM | POA: Diagnosis not present

## 2023-04-02 DIAGNOSIS — D509 Iron deficiency anemia, unspecified: Secondary | ICD-10-CM | POA: Diagnosis not present

## 2023-04-02 DIAGNOSIS — N186 End stage renal disease: Secondary | ICD-10-CM | POA: Diagnosis not present

## 2023-04-04 DIAGNOSIS — H35033 Hypertensive retinopathy, bilateral: Secondary | ICD-10-CM | POA: Diagnosis not present

## 2023-04-04 DIAGNOSIS — E119 Type 2 diabetes mellitus without complications: Secondary | ICD-10-CM | POA: Diagnosis not present

## 2023-04-04 DIAGNOSIS — H5703 Miosis: Secondary | ICD-10-CM | POA: Diagnosis not present

## 2023-04-04 DIAGNOSIS — H2513 Age-related nuclear cataract, bilateral: Secondary | ICD-10-CM | POA: Diagnosis not present

## 2023-04-04 DIAGNOSIS — H34211 Partial retinal artery occlusion, right eye: Secondary | ICD-10-CM | POA: Diagnosis not present

## 2023-04-04 LAB — HM DIABETES EYE EXAM

## 2023-04-05 DIAGNOSIS — D631 Anemia in chronic kidney disease: Secondary | ICD-10-CM | POA: Diagnosis not present

## 2023-04-05 DIAGNOSIS — D689 Coagulation defect, unspecified: Secondary | ICD-10-CM | POA: Diagnosis not present

## 2023-04-05 DIAGNOSIS — N2581 Secondary hyperparathyroidism of renal origin: Secondary | ICD-10-CM | POA: Diagnosis not present

## 2023-04-05 DIAGNOSIS — N186 End stage renal disease: Secondary | ICD-10-CM | POA: Diagnosis not present

## 2023-04-05 DIAGNOSIS — E1129 Type 2 diabetes mellitus with other diabetic kidney complication: Secondary | ICD-10-CM | POA: Diagnosis not present

## 2023-04-05 DIAGNOSIS — Z992 Dependence on renal dialysis: Secondary | ICD-10-CM | POA: Diagnosis not present

## 2023-04-05 DIAGNOSIS — D509 Iron deficiency anemia, unspecified: Secondary | ICD-10-CM | POA: Diagnosis not present

## 2023-04-07 DIAGNOSIS — N186 End stage renal disease: Secondary | ICD-10-CM | POA: Diagnosis not present

## 2023-04-07 DIAGNOSIS — D689 Coagulation defect, unspecified: Secondary | ICD-10-CM | POA: Diagnosis not present

## 2023-04-07 DIAGNOSIS — D631 Anemia in chronic kidney disease: Secondary | ICD-10-CM | POA: Diagnosis not present

## 2023-04-07 DIAGNOSIS — Z992 Dependence on renal dialysis: Secondary | ICD-10-CM | POA: Diagnosis not present

## 2023-04-07 DIAGNOSIS — N2581 Secondary hyperparathyroidism of renal origin: Secondary | ICD-10-CM | POA: Diagnosis not present

## 2023-04-07 DIAGNOSIS — D509 Iron deficiency anemia, unspecified: Secondary | ICD-10-CM | POA: Diagnosis not present

## 2023-04-07 DIAGNOSIS — E1129 Type 2 diabetes mellitus with other diabetic kidney complication: Secondary | ICD-10-CM | POA: Diagnosis not present

## 2023-04-09 DIAGNOSIS — D689 Coagulation defect, unspecified: Secondary | ICD-10-CM | POA: Diagnosis not present

## 2023-04-09 DIAGNOSIS — N2581 Secondary hyperparathyroidism of renal origin: Secondary | ICD-10-CM | POA: Diagnosis not present

## 2023-04-09 DIAGNOSIS — Z992 Dependence on renal dialysis: Secondary | ICD-10-CM | POA: Diagnosis not present

## 2023-04-09 DIAGNOSIS — E1129 Type 2 diabetes mellitus with other diabetic kidney complication: Secondary | ICD-10-CM | POA: Diagnosis not present

## 2023-04-09 DIAGNOSIS — D509 Iron deficiency anemia, unspecified: Secondary | ICD-10-CM | POA: Diagnosis not present

## 2023-04-09 DIAGNOSIS — D631 Anemia in chronic kidney disease: Secondary | ICD-10-CM | POA: Diagnosis not present

## 2023-04-09 DIAGNOSIS — N186 End stage renal disease: Secondary | ICD-10-CM | POA: Diagnosis not present

## 2023-04-11 ENCOUNTER — Ambulatory Visit (INDEPENDENT_AMBULATORY_CARE_PROVIDER_SITE_OTHER): Payer: Medicare Other | Admitting: Podiatry

## 2023-04-11 ENCOUNTER — Encounter: Payer: Self-pay | Admitting: Podiatry

## 2023-04-11 DIAGNOSIS — B351 Tinea unguium: Secondary | ICD-10-CM | POA: Diagnosis not present

## 2023-04-11 DIAGNOSIS — M79676 Pain in unspecified toe(s): Secondary | ICD-10-CM

## 2023-04-11 DIAGNOSIS — Z992 Dependence on renal dialysis: Secondary | ICD-10-CM | POA: Diagnosis not present

## 2023-04-11 DIAGNOSIS — E1142 Type 2 diabetes mellitus with diabetic polyneuropathy: Secondary | ICD-10-CM

## 2023-04-11 DIAGNOSIS — E1122 Type 2 diabetes mellitus with diabetic chronic kidney disease: Secondary | ICD-10-CM | POA: Diagnosis not present

## 2023-04-11 DIAGNOSIS — N186 End stage renal disease: Secondary | ICD-10-CM | POA: Diagnosis not present

## 2023-04-11 DIAGNOSIS — Q828 Other specified congenital malformations of skin: Secondary | ICD-10-CM | POA: Diagnosis not present

## 2023-04-11 NOTE — Progress Notes (Signed)
This patient returns to my office for at risk foot care.  This patient requires this care by a professional since this patient will be at risk due to having  ESRD, CKD and diabetes.  This patient is unable to cut nails himself since the patient cannot reach his nails.These nails are painful walking and wearing shoes.  This patient presents for at risk foot care today.  General Appearance  Alert, conversant and in no acute stress.  Vascular  Dorsalis pedis and posterior tibial  pulses are palpable  bilaterally.  Capillary return is within normal limits  bilaterally. Temperature is within normal limits  bilaterally.  Neurologic  Senn-Weinstein monofilament wire test diminished   bilaterally. Muscle power within normal limits bilaterally.  Nails Thick disfigured discolored nails with subungual debris  from hallux to fifth toes bilaterally. No evidence of bacterial infection or drainage bilaterally.  Orthopedic  No limitations of motion  feet .  No crepitus or effusions noted.  No bony pathology or digital deformities noted.  Skin  normotropic skin  noted bilaterally.  No signs of infections or ulcers noted.   Porokeratosis sub 4  B/L.   symptomatic.  Onychomycosis  Pain in right toes  Pain in left toes  Porokeratosis sub 4 left foot.  Consent was obtained for treatment procedures.   Mechanical debridement of nails 1-5  bilaterally performed with a nail nipper.  Filed with dremel without incident.  Debride porokeratosis with # 15 blade and dremel tool.   Return office visit   9 weeks                  Told patient to return for periodic foot care and evaluation due to potential at risk complications.   Gardiner Barefoot DPM

## 2023-04-12 DIAGNOSIS — N2581 Secondary hyperparathyroidism of renal origin: Secondary | ICD-10-CM | POA: Diagnosis not present

## 2023-04-12 DIAGNOSIS — T8249XA Other complication of vascular dialysis catheter, initial encounter: Secondary | ICD-10-CM | POA: Diagnosis not present

## 2023-04-12 DIAGNOSIS — Z992 Dependence on renal dialysis: Secondary | ICD-10-CM | POA: Diagnosis not present

## 2023-04-12 DIAGNOSIS — D509 Iron deficiency anemia, unspecified: Secondary | ICD-10-CM | POA: Diagnosis not present

## 2023-04-12 DIAGNOSIS — N186 End stage renal disease: Secondary | ICD-10-CM | POA: Diagnosis not present

## 2023-04-12 DIAGNOSIS — D689 Coagulation defect, unspecified: Secondary | ICD-10-CM | POA: Diagnosis not present

## 2023-04-12 DIAGNOSIS — E1129 Type 2 diabetes mellitus with other diabetic kidney complication: Secondary | ICD-10-CM | POA: Diagnosis not present

## 2023-04-12 DIAGNOSIS — D631 Anemia in chronic kidney disease: Secondary | ICD-10-CM | POA: Diagnosis not present

## 2023-04-14 DIAGNOSIS — E1129 Type 2 diabetes mellitus with other diabetic kidney complication: Secondary | ICD-10-CM | POA: Diagnosis not present

## 2023-04-14 DIAGNOSIS — D689 Coagulation defect, unspecified: Secondary | ICD-10-CM | POA: Diagnosis not present

## 2023-04-14 DIAGNOSIS — D631 Anemia in chronic kidney disease: Secondary | ICD-10-CM | POA: Diagnosis not present

## 2023-04-14 DIAGNOSIS — N186 End stage renal disease: Secondary | ICD-10-CM | POA: Diagnosis not present

## 2023-04-14 DIAGNOSIS — D509 Iron deficiency anemia, unspecified: Secondary | ICD-10-CM | POA: Diagnosis not present

## 2023-04-14 DIAGNOSIS — T8249XA Other complication of vascular dialysis catheter, initial encounter: Secondary | ICD-10-CM | POA: Diagnosis not present

## 2023-04-14 DIAGNOSIS — N2581 Secondary hyperparathyroidism of renal origin: Secondary | ICD-10-CM | POA: Diagnosis not present

## 2023-04-14 DIAGNOSIS — Z992 Dependence on renal dialysis: Secondary | ICD-10-CM | POA: Diagnosis not present

## 2023-04-16 DIAGNOSIS — D631 Anemia in chronic kidney disease: Secondary | ICD-10-CM | POA: Diagnosis not present

## 2023-04-16 DIAGNOSIS — Z992 Dependence on renal dialysis: Secondary | ICD-10-CM | POA: Diagnosis not present

## 2023-04-16 DIAGNOSIS — N2581 Secondary hyperparathyroidism of renal origin: Secondary | ICD-10-CM | POA: Diagnosis not present

## 2023-04-16 DIAGNOSIS — T8249XA Other complication of vascular dialysis catheter, initial encounter: Secondary | ICD-10-CM | POA: Diagnosis not present

## 2023-04-16 DIAGNOSIS — N186 End stage renal disease: Secondary | ICD-10-CM | POA: Diagnosis not present

## 2023-04-16 DIAGNOSIS — D689 Coagulation defect, unspecified: Secondary | ICD-10-CM | POA: Diagnosis not present

## 2023-04-16 DIAGNOSIS — D509 Iron deficiency anemia, unspecified: Secondary | ICD-10-CM | POA: Diagnosis not present

## 2023-04-16 DIAGNOSIS — E1129 Type 2 diabetes mellitus with other diabetic kidney complication: Secondary | ICD-10-CM | POA: Diagnosis not present

## 2023-04-18 DIAGNOSIS — Z992 Dependence on renal dialysis: Secondary | ICD-10-CM | POA: Diagnosis not present

## 2023-04-18 DIAGNOSIS — Z452 Encounter for adjustment and management of vascular access device: Secondary | ICD-10-CM | POA: Diagnosis not present

## 2023-04-18 DIAGNOSIS — N186 End stage renal disease: Secondary | ICD-10-CM | POA: Diagnosis not present

## 2023-04-19 DIAGNOSIS — D689 Coagulation defect, unspecified: Secondary | ICD-10-CM | POA: Diagnosis not present

## 2023-04-19 DIAGNOSIS — T8249XA Other complication of vascular dialysis catheter, initial encounter: Secondary | ICD-10-CM | POA: Diagnosis not present

## 2023-04-19 DIAGNOSIS — N186 End stage renal disease: Secondary | ICD-10-CM | POA: Diagnosis not present

## 2023-04-19 DIAGNOSIS — E1129 Type 2 diabetes mellitus with other diabetic kidney complication: Secondary | ICD-10-CM | POA: Diagnosis not present

## 2023-04-19 DIAGNOSIS — D509 Iron deficiency anemia, unspecified: Secondary | ICD-10-CM | POA: Diagnosis not present

## 2023-04-19 DIAGNOSIS — N2581 Secondary hyperparathyroidism of renal origin: Secondary | ICD-10-CM | POA: Diagnosis not present

## 2023-04-19 DIAGNOSIS — Z992 Dependence on renal dialysis: Secondary | ICD-10-CM | POA: Diagnosis not present

## 2023-04-19 DIAGNOSIS — D631 Anemia in chronic kidney disease: Secondary | ICD-10-CM | POA: Diagnosis not present

## 2023-04-21 DIAGNOSIS — T8249XA Other complication of vascular dialysis catheter, initial encounter: Secondary | ICD-10-CM | POA: Diagnosis not present

## 2023-04-21 DIAGNOSIS — N2581 Secondary hyperparathyroidism of renal origin: Secondary | ICD-10-CM | POA: Diagnosis not present

## 2023-04-21 DIAGNOSIS — D631 Anemia in chronic kidney disease: Secondary | ICD-10-CM | POA: Diagnosis not present

## 2023-04-21 DIAGNOSIS — Z992 Dependence on renal dialysis: Secondary | ICD-10-CM | POA: Diagnosis not present

## 2023-04-21 DIAGNOSIS — D689 Coagulation defect, unspecified: Secondary | ICD-10-CM | POA: Diagnosis not present

## 2023-04-21 DIAGNOSIS — D509 Iron deficiency anemia, unspecified: Secondary | ICD-10-CM | POA: Diagnosis not present

## 2023-04-21 DIAGNOSIS — E1129 Type 2 diabetes mellitus with other diabetic kidney complication: Secondary | ICD-10-CM | POA: Diagnosis not present

## 2023-04-21 DIAGNOSIS — N186 End stage renal disease: Secondary | ICD-10-CM | POA: Diagnosis not present

## 2023-04-23 DIAGNOSIS — D631 Anemia in chronic kidney disease: Secondary | ICD-10-CM | POA: Diagnosis not present

## 2023-04-23 DIAGNOSIS — N186 End stage renal disease: Secondary | ICD-10-CM | POA: Diagnosis not present

## 2023-04-23 DIAGNOSIS — T8249XA Other complication of vascular dialysis catheter, initial encounter: Secondary | ICD-10-CM | POA: Diagnosis not present

## 2023-04-23 DIAGNOSIS — Z992 Dependence on renal dialysis: Secondary | ICD-10-CM | POA: Diagnosis not present

## 2023-04-23 DIAGNOSIS — D689 Coagulation defect, unspecified: Secondary | ICD-10-CM | POA: Diagnosis not present

## 2023-04-23 DIAGNOSIS — D509 Iron deficiency anemia, unspecified: Secondary | ICD-10-CM | POA: Diagnosis not present

## 2023-04-23 DIAGNOSIS — N2581 Secondary hyperparathyroidism of renal origin: Secondary | ICD-10-CM | POA: Diagnosis not present

## 2023-04-23 DIAGNOSIS — E1129 Type 2 diabetes mellitus with other diabetic kidney complication: Secondary | ICD-10-CM | POA: Diagnosis not present

## 2023-04-26 DIAGNOSIS — N186 End stage renal disease: Secondary | ICD-10-CM | POA: Diagnosis not present

## 2023-04-26 DIAGNOSIS — N2581 Secondary hyperparathyroidism of renal origin: Secondary | ICD-10-CM | POA: Diagnosis not present

## 2023-04-26 DIAGNOSIS — Z992 Dependence on renal dialysis: Secondary | ICD-10-CM | POA: Diagnosis not present

## 2023-04-26 DIAGNOSIS — E1129 Type 2 diabetes mellitus with other diabetic kidney complication: Secondary | ICD-10-CM | POA: Diagnosis not present

## 2023-04-26 DIAGNOSIS — D509 Iron deficiency anemia, unspecified: Secondary | ICD-10-CM | POA: Diagnosis not present

## 2023-04-26 DIAGNOSIS — D689 Coagulation defect, unspecified: Secondary | ICD-10-CM | POA: Diagnosis not present

## 2023-04-26 DIAGNOSIS — D631 Anemia in chronic kidney disease: Secondary | ICD-10-CM | POA: Diagnosis not present

## 2023-04-26 DIAGNOSIS — T8249XA Other complication of vascular dialysis catheter, initial encounter: Secondary | ICD-10-CM | POA: Diagnosis not present

## 2023-04-28 DIAGNOSIS — T8249XA Other complication of vascular dialysis catheter, initial encounter: Secondary | ICD-10-CM | POA: Diagnosis not present

## 2023-04-28 DIAGNOSIS — E1129 Type 2 diabetes mellitus with other diabetic kidney complication: Secondary | ICD-10-CM | POA: Diagnosis not present

## 2023-04-28 DIAGNOSIS — Z992 Dependence on renal dialysis: Secondary | ICD-10-CM | POA: Diagnosis not present

## 2023-04-28 DIAGNOSIS — N2581 Secondary hyperparathyroidism of renal origin: Secondary | ICD-10-CM | POA: Diagnosis not present

## 2023-04-28 DIAGNOSIS — D689 Coagulation defect, unspecified: Secondary | ICD-10-CM | POA: Diagnosis not present

## 2023-04-28 DIAGNOSIS — D509 Iron deficiency anemia, unspecified: Secondary | ICD-10-CM | POA: Diagnosis not present

## 2023-04-28 DIAGNOSIS — D631 Anemia in chronic kidney disease: Secondary | ICD-10-CM | POA: Diagnosis not present

## 2023-04-28 DIAGNOSIS — N186 End stage renal disease: Secondary | ICD-10-CM | POA: Diagnosis not present

## 2023-04-30 DIAGNOSIS — D631 Anemia in chronic kidney disease: Secondary | ICD-10-CM | POA: Diagnosis not present

## 2023-04-30 DIAGNOSIS — Z992 Dependence on renal dialysis: Secondary | ICD-10-CM | POA: Diagnosis not present

## 2023-04-30 DIAGNOSIS — E1129 Type 2 diabetes mellitus with other diabetic kidney complication: Secondary | ICD-10-CM | POA: Diagnosis not present

## 2023-04-30 DIAGNOSIS — N2581 Secondary hyperparathyroidism of renal origin: Secondary | ICD-10-CM | POA: Diagnosis not present

## 2023-04-30 DIAGNOSIS — N186 End stage renal disease: Secondary | ICD-10-CM | POA: Diagnosis not present

## 2023-04-30 DIAGNOSIS — D689 Coagulation defect, unspecified: Secondary | ICD-10-CM | POA: Diagnosis not present

## 2023-04-30 DIAGNOSIS — D509 Iron deficiency anemia, unspecified: Secondary | ICD-10-CM | POA: Diagnosis not present

## 2023-04-30 DIAGNOSIS — T8249XA Other complication of vascular dialysis catheter, initial encounter: Secondary | ICD-10-CM | POA: Diagnosis not present

## 2023-05-02 DIAGNOSIS — T7840XA Allergy, unspecified, initial encounter: Secondary | ICD-10-CM | POA: Insufficient documentation

## 2023-05-02 DIAGNOSIS — T829XXA Unspecified complication of cardiac and vascular prosthetic device, implant and graft, initial encounter: Secondary | ICD-10-CM | POA: Insufficient documentation

## 2023-05-02 DIAGNOSIS — T782XXA Anaphylactic shock, unspecified, initial encounter: Secondary | ICD-10-CM | POA: Insufficient documentation

## 2023-05-03 DIAGNOSIS — D509 Iron deficiency anemia, unspecified: Secondary | ICD-10-CM | POA: Diagnosis not present

## 2023-05-03 DIAGNOSIS — D631 Anemia in chronic kidney disease: Secondary | ICD-10-CM | POA: Diagnosis not present

## 2023-05-03 DIAGNOSIS — E1129 Type 2 diabetes mellitus with other diabetic kidney complication: Secondary | ICD-10-CM | POA: Diagnosis not present

## 2023-05-03 DIAGNOSIS — T8249XA Other complication of vascular dialysis catheter, initial encounter: Secondary | ICD-10-CM | POA: Diagnosis not present

## 2023-05-03 DIAGNOSIS — D689 Coagulation defect, unspecified: Secondary | ICD-10-CM | POA: Diagnosis not present

## 2023-05-03 DIAGNOSIS — N186 End stage renal disease: Secondary | ICD-10-CM | POA: Diagnosis not present

## 2023-05-03 DIAGNOSIS — N2581 Secondary hyperparathyroidism of renal origin: Secondary | ICD-10-CM | POA: Diagnosis not present

## 2023-05-03 DIAGNOSIS — Z992 Dependence on renal dialysis: Secondary | ICD-10-CM | POA: Diagnosis not present

## 2023-05-05 DIAGNOSIS — D509 Iron deficiency anemia, unspecified: Secondary | ICD-10-CM | POA: Diagnosis not present

## 2023-05-05 DIAGNOSIS — R519 Headache, unspecified: Secondary | ICD-10-CM | POA: Insufficient documentation

## 2023-05-05 DIAGNOSIS — E1129 Type 2 diabetes mellitus with other diabetic kidney complication: Secondary | ICD-10-CM | POA: Diagnosis not present

## 2023-05-05 DIAGNOSIS — T8249XA Other complication of vascular dialysis catheter, initial encounter: Secondary | ICD-10-CM | POA: Diagnosis not present

## 2023-05-05 DIAGNOSIS — D689 Coagulation defect, unspecified: Secondary | ICD-10-CM | POA: Diagnosis not present

## 2023-05-05 DIAGNOSIS — D631 Anemia in chronic kidney disease: Secondary | ICD-10-CM | POA: Diagnosis not present

## 2023-05-05 DIAGNOSIS — N2581 Secondary hyperparathyroidism of renal origin: Secondary | ICD-10-CM | POA: Diagnosis not present

## 2023-05-05 DIAGNOSIS — R197 Diarrhea, unspecified: Secondary | ICD-10-CM | POA: Insufficient documentation

## 2023-05-05 DIAGNOSIS — Z992 Dependence on renal dialysis: Secondary | ICD-10-CM | POA: Diagnosis not present

## 2023-05-05 DIAGNOSIS — N186 End stage renal disease: Secondary | ICD-10-CM | POA: Diagnosis not present

## 2023-05-05 DIAGNOSIS — R509 Fever, unspecified: Secondary | ICD-10-CM | POA: Insufficient documentation

## 2023-05-07 DIAGNOSIS — D509 Iron deficiency anemia, unspecified: Secondary | ICD-10-CM | POA: Diagnosis not present

## 2023-05-07 DIAGNOSIS — T8249XA Other complication of vascular dialysis catheter, initial encounter: Secondary | ICD-10-CM | POA: Diagnosis not present

## 2023-05-07 DIAGNOSIS — D631 Anemia in chronic kidney disease: Secondary | ICD-10-CM | POA: Diagnosis not present

## 2023-05-07 DIAGNOSIS — N2581 Secondary hyperparathyroidism of renal origin: Secondary | ICD-10-CM | POA: Diagnosis not present

## 2023-05-07 DIAGNOSIS — Z992 Dependence on renal dialysis: Secondary | ICD-10-CM | POA: Diagnosis not present

## 2023-05-07 DIAGNOSIS — E1129 Type 2 diabetes mellitus with other diabetic kidney complication: Secondary | ICD-10-CM | POA: Diagnosis not present

## 2023-05-07 DIAGNOSIS — N186 End stage renal disease: Secondary | ICD-10-CM | POA: Diagnosis not present

## 2023-05-07 DIAGNOSIS — D689 Coagulation defect, unspecified: Secondary | ICD-10-CM | POA: Diagnosis not present

## 2023-05-10 DIAGNOSIS — D689 Coagulation defect, unspecified: Secondary | ICD-10-CM | POA: Diagnosis not present

## 2023-05-10 DIAGNOSIS — N186 End stage renal disease: Secondary | ICD-10-CM | POA: Diagnosis not present

## 2023-05-10 DIAGNOSIS — E1129 Type 2 diabetes mellitus with other diabetic kidney complication: Secondary | ICD-10-CM | POA: Diagnosis not present

## 2023-05-10 DIAGNOSIS — Z992 Dependence on renal dialysis: Secondary | ICD-10-CM | POA: Diagnosis not present

## 2023-05-10 DIAGNOSIS — D631 Anemia in chronic kidney disease: Secondary | ICD-10-CM | POA: Diagnosis not present

## 2023-05-10 DIAGNOSIS — T8249XA Other complication of vascular dialysis catheter, initial encounter: Secondary | ICD-10-CM | POA: Diagnosis not present

## 2023-05-10 DIAGNOSIS — N2581 Secondary hyperparathyroidism of renal origin: Secondary | ICD-10-CM | POA: Diagnosis not present

## 2023-05-10 DIAGNOSIS — D509 Iron deficiency anemia, unspecified: Secondary | ICD-10-CM | POA: Diagnosis not present

## 2023-05-12 DIAGNOSIS — D631 Anemia in chronic kidney disease: Secondary | ICD-10-CM | POA: Diagnosis not present

## 2023-05-12 DIAGNOSIS — Z992 Dependence on renal dialysis: Secondary | ICD-10-CM | POA: Diagnosis not present

## 2023-05-12 DIAGNOSIS — E1122 Type 2 diabetes mellitus with diabetic chronic kidney disease: Secondary | ICD-10-CM | POA: Diagnosis not present

## 2023-05-12 DIAGNOSIS — T8249XA Other complication of vascular dialysis catheter, initial encounter: Secondary | ICD-10-CM | POA: Diagnosis not present

## 2023-05-12 DIAGNOSIS — E1129 Type 2 diabetes mellitus with other diabetic kidney complication: Secondary | ICD-10-CM | POA: Diagnosis not present

## 2023-05-12 DIAGNOSIS — N2581 Secondary hyperparathyroidism of renal origin: Secondary | ICD-10-CM | POA: Diagnosis not present

## 2023-05-12 DIAGNOSIS — D689 Coagulation defect, unspecified: Secondary | ICD-10-CM | POA: Diagnosis not present

## 2023-05-12 DIAGNOSIS — N186 End stage renal disease: Secondary | ICD-10-CM | POA: Diagnosis not present

## 2023-05-12 DIAGNOSIS — D509 Iron deficiency anemia, unspecified: Secondary | ICD-10-CM | POA: Diagnosis not present

## 2023-05-14 DIAGNOSIS — N2581 Secondary hyperparathyroidism of renal origin: Secondary | ICD-10-CM | POA: Diagnosis not present

## 2023-05-14 DIAGNOSIS — T8249XA Other complication of vascular dialysis catheter, initial encounter: Secondary | ICD-10-CM | POA: Diagnosis not present

## 2023-05-14 DIAGNOSIS — Z992 Dependence on renal dialysis: Secondary | ICD-10-CM | POA: Diagnosis not present

## 2023-05-14 DIAGNOSIS — D689 Coagulation defect, unspecified: Secondary | ICD-10-CM | POA: Diagnosis not present

## 2023-05-14 DIAGNOSIS — N186 End stage renal disease: Secondary | ICD-10-CM | POA: Diagnosis not present

## 2023-05-14 DIAGNOSIS — D631 Anemia in chronic kidney disease: Secondary | ICD-10-CM | POA: Diagnosis not present

## 2023-05-14 DIAGNOSIS — E1129 Type 2 diabetes mellitus with other diabetic kidney complication: Secondary | ICD-10-CM | POA: Diagnosis not present

## 2023-05-14 DIAGNOSIS — D509 Iron deficiency anemia, unspecified: Secondary | ICD-10-CM | POA: Diagnosis not present

## 2023-05-17 DIAGNOSIS — D689 Coagulation defect, unspecified: Secondary | ICD-10-CM | POA: Diagnosis not present

## 2023-05-17 DIAGNOSIS — T8249XA Other complication of vascular dialysis catheter, initial encounter: Secondary | ICD-10-CM | POA: Diagnosis not present

## 2023-05-17 DIAGNOSIS — E1129 Type 2 diabetes mellitus with other diabetic kidney complication: Secondary | ICD-10-CM | POA: Diagnosis not present

## 2023-05-17 DIAGNOSIS — D509 Iron deficiency anemia, unspecified: Secondary | ICD-10-CM | POA: Diagnosis not present

## 2023-05-17 DIAGNOSIS — D631 Anemia in chronic kidney disease: Secondary | ICD-10-CM | POA: Diagnosis not present

## 2023-05-17 DIAGNOSIS — N186 End stage renal disease: Secondary | ICD-10-CM | POA: Diagnosis not present

## 2023-05-17 DIAGNOSIS — N2581 Secondary hyperparathyroidism of renal origin: Secondary | ICD-10-CM | POA: Diagnosis not present

## 2023-05-17 DIAGNOSIS — Z992 Dependence on renal dialysis: Secondary | ICD-10-CM | POA: Diagnosis not present

## 2023-05-19 DIAGNOSIS — D631 Anemia in chronic kidney disease: Secondary | ICD-10-CM | POA: Diagnosis not present

## 2023-05-19 DIAGNOSIS — N2581 Secondary hyperparathyroidism of renal origin: Secondary | ICD-10-CM | POA: Diagnosis not present

## 2023-05-19 DIAGNOSIS — D689 Coagulation defect, unspecified: Secondary | ICD-10-CM | POA: Diagnosis not present

## 2023-05-19 DIAGNOSIS — T8249XA Other complication of vascular dialysis catheter, initial encounter: Secondary | ICD-10-CM | POA: Diagnosis not present

## 2023-05-19 DIAGNOSIS — D509 Iron deficiency anemia, unspecified: Secondary | ICD-10-CM | POA: Diagnosis not present

## 2023-05-19 DIAGNOSIS — Z992 Dependence on renal dialysis: Secondary | ICD-10-CM | POA: Diagnosis not present

## 2023-05-19 DIAGNOSIS — E1129 Type 2 diabetes mellitus with other diabetic kidney complication: Secondary | ICD-10-CM | POA: Diagnosis not present

## 2023-05-19 DIAGNOSIS — N186 End stage renal disease: Secondary | ICD-10-CM | POA: Diagnosis not present

## 2023-05-21 DIAGNOSIS — D631 Anemia in chronic kidney disease: Secondary | ICD-10-CM | POA: Diagnosis not present

## 2023-05-21 DIAGNOSIS — E1129 Type 2 diabetes mellitus with other diabetic kidney complication: Secondary | ICD-10-CM | POA: Diagnosis not present

## 2023-05-21 DIAGNOSIS — Z992 Dependence on renal dialysis: Secondary | ICD-10-CM | POA: Diagnosis not present

## 2023-05-21 DIAGNOSIS — N186 End stage renal disease: Secondary | ICD-10-CM | POA: Diagnosis not present

## 2023-05-21 DIAGNOSIS — D509 Iron deficiency anemia, unspecified: Secondary | ICD-10-CM | POA: Diagnosis not present

## 2023-05-21 DIAGNOSIS — D689 Coagulation defect, unspecified: Secondary | ICD-10-CM | POA: Diagnosis not present

## 2023-05-21 DIAGNOSIS — T8249XA Other complication of vascular dialysis catheter, initial encounter: Secondary | ICD-10-CM | POA: Diagnosis not present

## 2023-05-21 DIAGNOSIS — N2581 Secondary hyperparathyroidism of renal origin: Secondary | ICD-10-CM | POA: Diagnosis not present

## 2023-05-23 ENCOUNTER — Ambulatory Visit (INDEPENDENT_AMBULATORY_CARE_PROVIDER_SITE_OTHER): Payer: Medicare Other

## 2023-05-23 ENCOUNTER — Ambulatory Visit: Payer: Medicare Other | Admitting: Internal Medicine

## 2023-05-23 ENCOUNTER — Other Ambulatory Visit: Payer: Self-pay | Admitting: Internal Medicine

## 2023-05-23 ENCOUNTER — Encounter: Payer: Self-pay | Admitting: Internal Medicine

## 2023-05-23 VITALS — BP 140/72 | HR 96 | Temp 98.5°F | Ht 72.0 in | Wt 149.8 lb

## 2023-05-23 DIAGNOSIS — R634 Abnormal weight loss: Secondary | ICD-10-CM

## 2023-05-23 DIAGNOSIS — I10 Essential (primary) hypertension: Secondary | ICD-10-CM | POA: Diagnosis not present

## 2023-05-23 DIAGNOSIS — E782 Mixed hyperlipidemia: Secondary | ICD-10-CM | POA: Diagnosis not present

## 2023-05-23 DIAGNOSIS — Z720 Tobacco use: Secondary | ICD-10-CM | POA: Diagnosis not present

## 2023-05-23 DIAGNOSIS — Z23 Encounter for immunization: Secondary | ICD-10-CM | POA: Diagnosis not present

## 2023-05-23 DIAGNOSIS — F17200 Nicotine dependence, unspecified, uncomplicated: Secondary | ICD-10-CM

## 2023-05-23 DIAGNOSIS — E1129 Type 2 diabetes mellitus with other diabetic kidney complication: Secondary | ICD-10-CM | POA: Diagnosis not present

## 2023-05-23 LAB — POCT GLYCOSYLATED HEMOGLOBIN (HGB A1C): Hemoglobin A1C: 5.4 % (ref 4.0–5.6)

## 2023-05-23 MED ORDER — AMLODIPINE BESYLATE 2.5 MG PO TABS: 2.5000 mg | ORAL_TABLET | Freq: Every day | ORAL | 3 refills | Status: DC

## 2023-05-23 NOTE — Assessment & Plan Note (Addendum)
Lab Results  Component Value Date   HGBA1C 5.4 05/23/2023   Overcontrolled in light or recent wt loss,, pt to continue current medical treatment bydureon 0.85 weekly, to d/c actos 15

## 2023-05-23 NOTE — Patient Instructions (Addendum)
You had the flu shot today  Your A1c was done today; ok to stop the pioglitazone  Please continue all other medications as before, and refills have been done if requested.  Please have the pharmacy call with any other refills you may need.  Please continue your efforts at being more active, low cholesterol diet, and weight control.  Please keep your appointments with your specialists as you may have planned  Please go to the XRAY Department in the first floor for the x-ray testing  You will be contacted by phone if any changes need to be made immediately.  Otherwise, you will receive a letter about your results with an explanation, but please check with MyChart first.  Please make an Appointment to return in 6 months, or sooner if needed

## 2023-05-23 NOTE — Assessment & Plan Note (Signed)
Pt counsled to quit, pt not ready °

## 2023-05-23 NOTE — Assessment & Plan Note (Signed)
Etiology unclear, for cxr today in light of smoking

## 2023-05-23 NOTE — Assessment & Plan Note (Signed)
BP Readings from Last 3 Encounters:  05/23/23 (!) 140/72  03/09/23 (!) 164/85  02/04/23 127/75   uncontrolled, pt to continue medical treatment toprol xl 50 every day, and add amlodipine 2.5 mg qd

## 2023-05-23 NOTE — Progress Notes (Signed)
Patient ID: Christopher Lopez, male   DOB: Feb 20, 1961, 62 y.o.   MRN: [id number]        Chief Complaint: follow up HTN, smoker, wt loss, DM       HPI:  Christopher Lopez is a 62 y.o. male here overall doing ok,  Pt denies chest pain, increased sob or doe, wheezing, orthopnea, PND, increased LE swelling, palpitations, dizziness or syncope.   Pt denies polydipsia, polyuria, or new focal neuro s/s.    Pt denies fever, night sweats, loss of appetite, or other constitutional symptoms  but has lost significant wt recently for unclear reason. Still smoking.  Denies worsening depressive symptoms, suicidal ideation, or panic; Remains on HD   Due for flu shot Wt Readings from Last 3 Encounters:  05/23/23 149 lb 12.8 oz (67.9 kg)  03/09/23 165 lb (74.8 kg)  02/04/23 175 lb (79.4 kg)   BP Readings from Last 3 Encounters:  05/23/23 (!) 140/72  03/09/23 (!) 164/85  02/04/23 127/75         Past Medical History:  Diagnosis Date   Alcohol abuse 09/18/2011   Allergic rhinitis, cause unspecified 09/20/2011   Anemia, unspecified 09/18/2011   Arthritis    Childhood asthma 09/20/2011   CKD (chronic kidney disease) stage 5, GFR less than 15 ml/min (HCC)    Dialysis - T/Th/Sa   Colitis 09/18/2011   Dementia (HCC) 09/18/2011   Diabetes mellitus    Diverticulosis of colon without hemorrhage 02/27/2014   Foot ulcer (HCC) 09/18/2011   High cholesterol    HTN (hypertension) 09/18/2011   Hyperlipidemia 11/08/2011   Hypertension    Impaired glucose tolerance 09/18/2011   Pneumonia 09/18/2011   Pre-ulcerative corn or callous 11/20/2011   Stroke (HCC)    left leg deficit   Type II or unspecified type diabetes mellitus without mention of complication, uncontrolled 09/20/2011   Wears glasses    Past Surgical History:  Procedure Laterality Date   A/V FISTULAGRAM Left 12/13/2022   Procedure: A/V Fistulagram;  Surgeon: Maeola Harman, MD;  Location: [facility] INVASIVE CV LAB;  Service: Cardiovascular;   Laterality: Left;   AV FISTULA PLACEMENT Left 12/23/2017   Procedure: CREATION OF BASILIAC - CEPHALIC  ARTERIOVENOUS (AV) FISTULA  STAGE ONE;  Surgeon: Maeola Harman, MD;  Location: [facility] OR;  Service: Vascular;  Laterality: Left;   AV FISTULA PLACEMENT Left 07/26/2018   Procedure: INSERTION OF ARTERIOVENOUS (AV) GORE-TEX GRAFT ARM;  Surgeon: Maeola Harman, MD;  Location: [facility] OR;  Service: Vascular;  Laterality: Left;   BASCILIC VEIN TRANSPOSITION Left 03/01/2018   Procedure: BASILIC VEIN TRANSPOSITION SECOND STAGE;  Surgeon: Maeola Harman, MD;  Location: [facility] OR;  Service: Vascular;  Laterality: Left;   INSERTION OF DIALYSIS CATHETER Right 12/23/2017   Procedure: INSERTION OF PALINDROME  DIALYSIS CATHETER;  Surgeon: Maeola Harman, MD;  Location: [facility] OR;  Service: Vascular;  Laterality: Right;   INSERTION OF DIALYSIS CATHETER Left 02/04/2023   Procedure: INSERTION OF TUNNELED DIALYSIS CATHETER;  Surgeon: Maeola Harman, MD;  Location: [facility] OR;  Service: Vascular;  Laterality: Left;   MASS EXCISION  02/04/2023   Procedure: EXCISION PSEUDOANEURYSM LEFT ARM;  Surgeon: Maeola Harman, MD;  Location: [facility] OR;  Service: Vascular;;   PERIPHERAL VASCULAR BALLOON ANGIOPLASTY  12/13/2022   Procedure: PERIPHERAL VASCULAR BALLOON ANGIOPLASTY;  Surgeon: Maeola Harman, MD;  Location: [facility] INVASIVE CV LAB;  Service: Cardiovascular;;   REVISION OF ARTERIOVENOUS GORETEX GRAFT Left 02/04/2023   Procedure: REVISION  OF LEFT ARM ARTERIOVENOUS GORETEX GRAFT USING 7mm GORETEX STRETCH GRAFT;  Surgeon: Maeola Harman, MD;  Location: [facility] OR;  Service: Vascular;  Laterality: Left;   THROMBECTOMY BRACHIAL ARTERY Left 07/26/2018   Procedure: THROMBECTOMY LEFT UPPER EXTREMITY;  Surgeon: Maeola Harman, MD;  Location: [facility] OR;  Service: Vascular;  Laterality: Left;   TONSILLECTOMY  1985   VEIN REPAIR Left 07/26/2018   Procedure: STENT LEFT AXILLARY  VEIN;  Surgeon: Maeola Harman, MD;  Location: [facility] OR;  Service: Vascular;  Laterality: Left;    reports that he has been smoking cigarettes. He has never used smokeless tobacco. He reports that he does not currently use alcohol after a past usage of about 2.0 standard drinks of alcohol per week. He reports that he does not use drugs. family history includes Diabetes in his maternal grandfather, mother, and sister; Heart disease in his mother; Prostate cancer in his paternal grandfather; Transient ischemic attack in his mother. Allergies  Allergen Reactions   Lipitor [Atorvastatin] Other (See Comments)    pain   Current Outpatient Medications on File Prior to Visit  Medication Sig Dispense Refill   acetaminophen (TYLENOL) 650 MG CR tablet Take 1,300 mg by mouth every 8 (eight) hours as needed for pain.     aspirin 81 MG EC tablet Take 81 mg by mouth daily.     diphenhydrAMINE (BENADRYL) 25 mg capsule Take 75 mg by mouth daily as needed for itching.     Exenatide ER (BYDUREON BCISE) 2 MG/0.85ML AUIJ Inject 0.85 mLs into the skin once a week. 10.2 mL 1   glucose blood (ONE TOUCH ULTRA TEST) test strip Use as instructed once daily E11.9 100 each 12   Lancets (ONETOUCH ULTRASOFT) lancets 1 each by Other route 2 (two) times daily. Use to check blood sugars once a day Dx e11.9 100 each 3   metoprolol succinate (TOPROL-XL) 50 MG 24 hr tablet TAKE 1 TABLET BY MOUTH EVERY DAY  WITH OR IMMEDIATELY FOLLOWING A MEAL 90 tablet 3   rosuvastatin (CRESTOR) 20 MG tablet TAKE 1 TABLET BY MOUTH EVERY DAY 90 tablet 3   VELPHORO 500 MG chewable tablet Chew 1,000 mg by mouth 2 (two) times daily.     Current Facility-Administered Medications on File Prior to Visit  Medication Dose Route Frequency Provider Last Rate Last Admin   0.9 %  sodium chloride infusion  250 mL Intravenous PRN Maeola Harman, MD       sodium chloride flush (NS) 0.9 % injection 3 mL  3 mL Intravenous Q12H Maeola Harman, MD            ROS:  All others reviewed and negative.  Objective        PE:  BP (!) 140/72   Pulse 96   Temp 98.5 F (36.9 C) (Oral)   Ht 6' (1.829 m)   Wt 149 lb 12.8 oz (67.9 kg)   SpO2 99%   BMI 20.32 kg/m                 Constitutional: Pt appears in NAD               HENT: Head: NCAT.                Right Ear: External ear normal.                 Left Ear: External ear normal.  Eyes: . Pupils are equal, round, and reactive to light. Conjunctivae and EOM are normal               Nose: without d/c or deformity               Neck: Neck supple. Gross normal ROM               Cardiovascular: Normal rate and regular rhythm.                 Pulmonary/Chest: Effort normal and breath sounds without rales or wheezing.                Abd:  Soft, NT, ND, + BS, no organomegaly               Neurological: Pt is alert. At baseline orientation, motor grossly intact               Skin: Skin is warm. No rashes, no other new lesions, LE edema - none               Psychiatric: Pt behavior is normal without agitation   Micro: none  Cardiac tracings I have personally interpreted today:  none  Pertinent Radiological findings (summarize): none   Lab Results  Component Value Date   WBC 9.8 10/12/2021   HGB 12.2 (L) 02/04/2023   HCT 36.0 (L) 02/04/2023   PLT 263.0 10/12/2021   GLUCOSE 82 02/04/2023   CHOL 155 10/12/2021   TRIG 131.0 10/12/2021   HDL 34.90 (L) 10/12/2021   LDLDIRECT 131.0 08/11/2017   LDLCALC 94 10/12/2021   ALT 10 10/12/2021   AST 14 10/12/2021   NA 139 02/04/2023   K 4.1 02/04/2023   CL 95 (L) 02/04/2023   CREATININE 7.10 (H) 02/04/2023   BUN 13 02/04/2023   CO2 29 10/12/2021   TSH 1.82 10/12/2021   PSA 0.73 10/12/2021   HGBA1C 5.4 05/23/2023   MICROALBUR 98.3 (H) 08/11/2017   Hemoglobin A1C 4.0 - 5.6 % 5.4 6.4 Abnormal  6.3 Abnormal    Assessment/Plan:  Christopher Lopez is a 61 y.o. Black or African American [2] male with  has a  past medical history of Alcohol abuse (09/18/2011), Allergic rhinitis, cause unspecified (09/20/2011), Anemia, unspecified (09/18/2011), Arthritis, Childhood asthma (09/20/2011), CKD (chronic kidney disease) stage 5, GFR less than 15 ml/min (HCC), Colitis (09/18/2011), Dementia (HCC) (09/18/2011), Diabetes mellitus, Diverticulosis of colon without hemorrhage (02/27/2014), Foot ulcer (HCC) (09/18/2011), High cholesterol, HTN (hypertension) (09/18/2011), Hyperlipidemia (11/08/2011), Hypertension, Impaired glucose tolerance (09/18/2011), Pneumonia (09/18/2011), Pre-ulcerative corn or callous (11/20/2011), Stroke (HCC), Type II or unspecified type diabetes mellitus without mention of complication, uncontrolled (09/20/2011), and Wears glasses.  Type 2 diabetes mellitus with other diabetic kidney complication (HCC) Lab Results  Component Value Date   HGBA1C 5.4 05/23/2023   Overcontrolled in light or recent wt loss,, pt to continue current medical treatment bydureon 0.85 weekly, to d/c actos 15   Hyperlipidemia, unspecified Lab Results  Component Value Date   LDLCALC 94 10/12/2021   Uncontrolled, pt to continue crestor 20 every day, declines other change today   Essential (primary) hypertension BP Readings from Last 3 Encounters:  05/23/23 (!) 140/72  03/09/23 (!) 164/85  02/04/23 127/75   uncontrolled, pt to continue medical treatment toprol xl 50 every day, and add amlodipine 2.5 mg qd   Tobacco abuse Pt counsled to quit, pt not ready  Weight loss Etiology unclear, for cxr today in light of smoking  Followup: Return in about 6 months (around 11/20/2023).  Oliver Barre, MD 05/23/2023 1:00 PM [facility] Internal Medicine

## 2023-05-23 NOTE — Assessment & Plan Note (Signed)
Lab Results  Component Value Date   LDLCALC 94 10/12/2021   Uncontrolled, pt to continue crestor 20 every day, declines other change today

## 2023-05-24 ENCOUNTER — Ambulatory Visit: Payer: Medicare Other

## 2023-05-24 DIAGNOSIS — D509 Iron deficiency anemia, unspecified: Secondary | ICD-10-CM | POA: Diagnosis not present

## 2023-05-24 DIAGNOSIS — D631 Anemia in chronic kidney disease: Secondary | ICD-10-CM | POA: Diagnosis not present

## 2023-05-24 DIAGNOSIS — N186 End stage renal disease: Secondary | ICD-10-CM | POA: Diagnosis not present

## 2023-05-24 DIAGNOSIS — E1129 Type 2 diabetes mellitus with other diabetic kidney complication: Secondary | ICD-10-CM | POA: Diagnosis not present

## 2023-05-24 DIAGNOSIS — T8249XA Other complication of vascular dialysis catheter, initial encounter: Secondary | ICD-10-CM | POA: Diagnosis not present

## 2023-05-24 DIAGNOSIS — D689 Coagulation defect, unspecified: Secondary | ICD-10-CM | POA: Diagnosis not present

## 2023-05-24 DIAGNOSIS — N2581 Secondary hyperparathyroidism of renal origin: Secondary | ICD-10-CM | POA: Diagnosis not present

## 2023-05-24 DIAGNOSIS — Z992 Dependence on renal dialysis: Secondary | ICD-10-CM | POA: Diagnosis not present

## 2023-05-26 DIAGNOSIS — N186 End stage renal disease: Secondary | ICD-10-CM | POA: Diagnosis not present

## 2023-05-26 DIAGNOSIS — D509 Iron deficiency anemia, unspecified: Secondary | ICD-10-CM | POA: Diagnosis not present

## 2023-05-26 DIAGNOSIS — E1129 Type 2 diabetes mellitus with other diabetic kidney complication: Secondary | ICD-10-CM | POA: Diagnosis not present

## 2023-05-26 DIAGNOSIS — N2581 Secondary hyperparathyroidism of renal origin: Secondary | ICD-10-CM | POA: Diagnosis not present

## 2023-05-26 DIAGNOSIS — Z992 Dependence on renal dialysis: Secondary | ICD-10-CM | POA: Diagnosis not present

## 2023-05-26 DIAGNOSIS — D631 Anemia in chronic kidney disease: Secondary | ICD-10-CM | POA: Diagnosis not present

## 2023-05-26 DIAGNOSIS — D689 Coagulation defect, unspecified: Secondary | ICD-10-CM | POA: Diagnosis not present

## 2023-05-26 DIAGNOSIS — T8249XA Other complication of vascular dialysis catheter, initial encounter: Secondary | ICD-10-CM | POA: Diagnosis not present

## 2023-05-28 DIAGNOSIS — D509 Iron deficiency anemia, unspecified: Secondary | ICD-10-CM | POA: Diagnosis not present

## 2023-05-28 DIAGNOSIS — E1129 Type 2 diabetes mellitus with other diabetic kidney complication: Secondary | ICD-10-CM | POA: Diagnosis not present

## 2023-05-28 DIAGNOSIS — T8249XA Other complication of vascular dialysis catheter, initial encounter: Secondary | ICD-10-CM | POA: Diagnosis not present

## 2023-05-28 DIAGNOSIS — D689 Coagulation defect, unspecified: Secondary | ICD-10-CM | POA: Diagnosis not present

## 2023-05-28 DIAGNOSIS — Z992 Dependence on renal dialysis: Secondary | ICD-10-CM | POA: Diagnosis not present

## 2023-05-28 DIAGNOSIS — N2581 Secondary hyperparathyroidism of renal origin: Secondary | ICD-10-CM | POA: Diagnosis not present

## 2023-05-28 DIAGNOSIS — N186 End stage renal disease: Secondary | ICD-10-CM | POA: Diagnosis not present

## 2023-05-28 DIAGNOSIS — D631 Anemia in chronic kidney disease: Secondary | ICD-10-CM | POA: Diagnosis not present

## 2023-05-31 DIAGNOSIS — Z992 Dependence on renal dialysis: Secondary | ICD-10-CM | POA: Diagnosis not present

## 2023-05-31 DIAGNOSIS — N186 End stage renal disease: Secondary | ICD-10-CM | POA: Diagnosis not present

## 2023-05-31 DIAGNOSIS — T8249XA Other complication of vascular dialysis catheter, initial encounter: Secondary | ICD-10-CM | POA: Diagnosis not present

## 2023-05-31 DIAGNOSIS — N2581 Secondary hyperparathyroidism of renal origin: Secondary | ICD-10-CM | POA: Diagnosis not present

## 2023-05-31 DIAGNOSIS — E1129 Type 2 diabetes mellitus with other diabetic kidney complication: Secondary | ICD-10-CM | POA: Diagnosis not present

## 2023-05-31 DIAGNOSIS — D689 Coagulation defect, unspecified: Secondary | ICD-10-CM | POA: Diagnosis not present

## 2023-05-31 DIAGNOSIS — D631 Anemia in chronic kidney disease: Secondary | ICD-10-CM | POA: Diagnosis not present

## 2023-05-31 DIAGNOSIS — D509 Iron deficiency anemia, unspecified: Secondary | ICD-10-CM | POA: Diagnosis not present

## 2023-06-02 DIAGNOSIS — E1129 Type 2 diabetes mellitus with other diabetic kidney complication: Secondary | ICD-10-CM | POA: Diagnosis not present

## 2023-06-02 DIAGNOSIS — N186 End stage renal disease: Secondary | ICD-10-CM | POA: Diagnosis not present

## 2023-06-02 DIAGNOSIS — D509 Iron deficiency anemia, unspecified: Secondary | ICD-10-CM | POA: Diagnosis not present

## 2023-06-02 DIAGNOSIS — Z992 Dependence on renal dialysis: Secondary | ICD-10-CM | POA: Diagnosis not present

## 2023-06-02 DIAGNOSIS — N2581 Secondary hyperparathyroidism of renal origin: Secondary | ICD-10-CM | POA: Diagnosis not present

## 2023-06-02 DIAGNOSIS — T8249XA Other complication of vascular dialysis catheter, initial encounter: Secondary | ICD-10-CM | POA: Diagnosis not present

## 2023-06-02 DIAGNOSIS — D689 Coagulation defect, unspecified: Secondary | ICD-10-CM | POA: Diagnosis not present

## 2023-06-02 DIAGNOSIS — D631 Anemia in chronic kidney disease: Secondary | ICD-10-CM | POA: Diagnosis not present

## 2023-06-04 DIAGNOSIS — D689 Coagulation defect, unspecified: Secondary | ICD-10-CM | POA: Diagnosis not present

## 2023-06-04 DIAGNOSIS — D509 Iron deficiency anemia, unspecified: Secondary | ICD-10-CM | POA: Diagnosis not present

## 2023-06-04 DIAGNOSIS — Z992 Dependence on renal dialysis: Secondary | ICD-10-CM | POA: Diagnosis not present

## 2023-06-04 DIAGNOSIS — N186 End stage renal disease: Secondary | ICD-10-CM | POA: Diagnosis not present

## 2023-06-04 DIAGNOSIS — D631 Anemia in chronic kidney disease: Secondary | ICD-10-CM | POA: Diagnosis not present

## 2023-06-04 DIAGNOSIS — N2581 Secondary hyperparathyroidism of renal origin: Secondary | ICD-10-CM | POA: Diagnosis not present

## 2023-06-04 DIAGNOSIS — T8249XA Other complication of vascular dialysis catheter, initial encounter: Secondary | ICD-10-CM | POA: Diagnosis not present

## 2023-06-04 DIAGNOSIS — E1129 Type 2 diabetes mellitus with other diabetic kidney complication: Secondary | ICD-10-CM | POA: Diagnosis not present

## 2023-06-06 DIAGNOSIS — T8249XA Other complication of vascular dialysis catheter, initial encounter: Secondary | ICD-10-CM | POA: Diagnosis not present

## 2023-06-06 DIAGNOSIS — D631 Anemia in chronic kidney disease: Secondary | ICD-10-CM | POA: Diagnosis not present

## 2023-06-06 DIAGNOSIS — N186 End stage renal disease: Secondary | ICD-10-CM | POA: Diagnosis not present

## 2023-06-06 DIAGNOSIS — N2581 Secondary hyperparathyroidism of renal origin: Secondary | ICD-10-CM | POA: Diagnosis not present

## 2023-06-06 DIAGNOSIS — E1129 Type 2 diabetes mellitus with other diabetic kidney complication: Secondary | ICD-10-CM | POA: Diagnosis not present

## 2023-06-06 DIAGNOSIS — Z992 Dependence on renal dialysis: Secondary | ICD-10-CM | POA: Diagnosis not present

## 2023-06-06 DIAGNOSIS — D689 Coagulation defect, unspecified: Secondary | ICD-10-CM | POA: Diagnosis not present

## 2023-06-06 DIAGNOSIS — D509 Iron deficiency anemia, unspecified: Secondary | ICD-10-CM | POA: Diagnosis not present

## 2023-06-08 DIAGNOSIS — Z992 Dependence on renal dialysis: Secondary | ICD-10-CM | POA: Diagnosis not present

## 2023-06-08 DIAGNOSIS — D631 Anemia in chronic kidney disease: Secondary | ICD-10-CM | POA: Diagnosis not present

## 2023-06-08 DIAGNOSIS — E1129 Type 2 diabetes mellitus with other diabetic kidney complication: Secondary | ICD-10-CM | POA: Diagnosis not present

## 2023-06-08 DIAGNOSIS — N2581 Secondary hyperparathyroidism of renal origin: Secondary | ICD-10-CM | POA: Diagnosis not present

## 2023-06-08 DIAGNOSIS — D689 Coagulation defect, unspecified: Secondary | ICD-10-CM | POA: Diagnosis not present

## 2023-06-08 DIAGNOSIS — N186 End stage renal disease: Secondary | ICD-10-CM | POA: Diagnosis not present

## 2023-06-08 DIAGNOSIS — T8249XA Other complication of vascular dialysis catheter, initial encounter: Secondary | ICD-10-CM | POA: Diagnosis not present

## 2023-06-08 DIAGNOSIS — D509 Iron deficiency anemia, unspecified: Secondary | ICD-10-CM | POA: Diagnosis not present

## 2023-06-10 ENCOUNTER — Other Ambulatory Visit: Payer: Self-pay | Admitting: Internal Medicine

## 2023-06-11 DIAGNOSIS — T8249XA Other complication of vascular dialysis catheter, initial encounter: Secondary | ICD-10-CM | POA: Diagnosis not present

## 2023-06-11 DIAGNOSIS — D509 Iron deficiency anemia, unspecified: Secondary | ICD-10-CM | POA: Diagnosis not present

## 2023-06-11 DIAGNOSIS — D631 Anemia in chronic kidney disease: Secondary | ICD-10-CM | POA: Diagnosis not present

## 2023-06-11 DIAGNOSIS — N186 End stage renal disease: Secondary | ICD-10-CM | POA: Diagnosis not present

## 2023-06-11 DIAGNOSIS — D689 Coagulation defect, unspecified: Secondary | ICD-10-CM | POA: Diagnosis not present

## 2023-06-11 DIAGNOSIS — N2581 Secondary hyperparathyroidism of renal origin: Secondary | ICD-10-CM | POA: Diagnosis not present

## 2023-06-11 DIAGNOSIS — E1122 Type 2 diabetes mellitus with diabetic chronic kidney disease: Secondary | ICD-10-CM | POA: Diagnosis not present

## 2023-06-11 DIAGNOSIS — Z992 Dependence on renal dialysis: Secondary | ICD-10-CM | POA: Diagnosis not present

## 2023-06-11 DIAGNOSIS — E1129 Type 2 diabetes mellitus with other diabetic kidney complication: Secondary | ICD-10-CM | POA: Diagnosis not present

## 2023-06-13 ENCOUNTER — Ambulatory Visit (INDEPENDENT_AMBULATORY_CARE_PROVIDER_SITE_OTHER): Payer: Medicare Other | Admitting: Podiatry

## 2023-06-13 ENCOUNTER — Encounter: Payer: Self-pay | Admitting: Podiatry

## 2023-06-13 ENCOUNTER — Other Ambulatory Visit: Payer: Self-pay

## 2023-06-13 DIAGNOSIS — B351 Tinea unguium: Secondary | ICD-10-CM

## 2023-06-13 DIAGNOSIS — Q828 Other specified congenital malformations of skin: Secondary | ICD-10-CM

## 2023-06-13 DIAGNOSIS — M79676 Pain in unspecified toe(s): Secondary | ICD-10-CM | POA: Diagnosis not present

## 2023-06-13 DIAGNOSIS — N186 End stage renal disease: Secondary | ICD-10-CM

## 2023-06-13 DIAGNOSIS — E1142 Type 2 diabetes mellitus with diabetic polyneuropathy: Secondary | ICD-10-CM

## 2023-06-13 NOTE — Progress Notes (Signed)
This patient returns to my office for at risk foot care.  This patient requires this care by a professional since this patient will be at risk due to having  ESRD, CKD and diabetes.  This patient is unable to cut nails himself since the patient cannot reach his nails.These nails are painful walking and wearing shoes.  This patient presents for at risk foot care today.  General Appearance  Alert, conversant and in no acute stress.  Vascular  Dorsalis pedis and posterior tibial  pulses are palpable  bilaterally.  Capillary return is within normal limits  bilaterally. Temperature is within normal limits  bilaterally.  Neurologic  Senn-Weinstein monofilament wire test diminished   bilaterally. Muscle power within normal limits bilaterally.  Nails Thick disfigured discolored nails with subungual debris  from hallux to fifth toes bilaterally. No evidence of bacterial infection or drainage bilaterally.  Orthopedic  No limitations of motion  feet .  No crepitus or effusions noted.  No bony pathology or digital deformities noted.  Skin  normotropic skin  noted bilaterally.  No signs of infections or ulcers noted.   Porokeratosis sub 4  B/L.   symptomatic.  Onychomycosis  Pain in right toes  Pain in left toes  Porokeratosis sub 4 left foot.  Consent was obtained for treatment procedures.   Mechanical debridement of nails 1-5  bilaterally performed with a nail nipper.  Filed with dremel without incident.  Debride porokeratosis with # 15 blade and dremel tool.   Return office visit   10  weeks                  Told patient to return for periodic foot care and evaluation due to potential at risk complications.   Helane Gunther DPM

## 2023-06-14 DIAGNOSIS — D631 Anemia in chronic kidney disease: Secondary | ICD-10-CM | POA: Diagnosis not present

## 2023-06-14 DIAGNOSIS — D689 Coagulation defect, unspecified: Secondary | ICD-10-CM | POA: Diagnosis not present

## 2023-06-14 DIAGNOSIS — E1129 Type 2 diabetes mellitus with other diabetic kidney complication: Secondary | ICD-10-CM | POA: Diagnosis not present

## 2023-06-14 DIAGNOSIS — N2581 Secondary hyperparathyroidism of renal origin: Secondary | ICD-10-CM | POA: Diagnosis not present

## 2023-06-14 DIAGNOSIS — D509 Iron deficiency anemia, unspecified: Secondary | ICD-10-CM | POA: Diagnosis not present

## 2023-06-14 DIAGNOSIS — N186 End stage renal disease: Secondary | ICD-10-CM | POA: Diagnosis not present

## 2023-06-14 DIAGNOSIS — Z992 Dependence on renal dialysis: Secondary | ICD-10-CM | POA: Diagnosis not present

## 2023-06-16 DIAGNOSIS — E1129 Type 2 diabetes mellitus with other diabetic kidney complication: Secondary | ICD-10-CM | POA: Diagnosis not present

## 2023-06-16 DIAGNOSIS — D689 Coagulation defect, unspecified: Secondary | ICD-10-CM | POA: Diagnosis not present

## 2023-06-16 DIAGNOSIS — N2581 Secondary hyperparathyroidism of renal origin: Secondary | ICD-10-CM | POA: Diagnosis not present

## 2023-06-16 DIAGNOSIS — Z992 Dependence on renal dialysis: Secondary | ICD-10-CM | POA: Diagnosis not present

## 2023-06-16 DIAGNOSIS — N186 End stage renal disease: Secondary | ICD-10-CM | POA: Diagnosis not present

## 2023-06-16 DIAGNOSIS — D509 Iron deficiency anemia, unspecified: Secondary | ICD-10-CM | POA: Diagnosis not present

## 2023-06-16 DIAGNOSIS — D631 Anemia in chronic kidney disease: Secondary | ICD-10-CM | POA: Diagnosis not present

## 2023-06-18 DIAGNOSIS — D689 Coagulation defect, unspecified: Secondary | ICD-10-CM | POA: Diagnosis not present

## 2023-06-18 DIAGNOSIS — E1129 Type 2 diabetes mellitus with other diabetic kidney complication: Secondary | ICD-10-CM | POA: Diagnosis not present

## 2023-06-18 DIAGNOSIS — N2581 Secondary hyperparathyroidism of renal origin: Secondary | ICD-10-CM | POA: Diagnosis not present

## 2023-06-18 DIAGNOSIS — Z992 Dependence on renal dialysis: Secondary | ICD-10-CM | POA: Diagnosis not present

## 2023-06-18 DIAGNOSIS — D509 Iron deficiency anemia, unspecified: Secondary | ICD-10-CM | POA: Diagnosis not present

## 2023-06-18 DIAGNOSIS — D631 Anemia in chronic kidney disease: Secondary | ICD-10-CM | POA: Diagnosis not present

## 2023-06-18 DIAGNOSIS — N186 End stage renal disease: Secondary | ICD-10-CM | POA: Diagnosis not present

## 2023-06-21 DIAGNOSIS — D689 Coagulation defect, unspecified: Secondary | ICD-10-CM | POA: Diagnosis not present

## 2023-06-21 DIAGNOSIS — N2581 Secondary hyperparathyroidism of renal origin: Secondary | ICD-10-CM | POA: Diagnosis not present

## 2023-06-21 DIAGNOSIS — E1129 Type 2 diabetes mellitus with other diabetic kidney complication: Secondary | ICD-10-CM | POA: Diagnosis not present

## 2023-06-21 DIAGNOSIS — N186 End stage renal disease: Secondary | ICD-10-CM | POA: Diagnosis not present

## 2023-06-21 DIAGNOSIS — D631 Anemia in chronic kidney disease: Secondary | ICD-10-CM | POA: Diagnosis not present

## 2023-06-21 DIAGNOSIS — Z992 Dependence on renal dialysis: Secondary | ICD-10-CM | POA: Diagnosis not present

## 2023-06-21 DIAGNOSIS — D509 Iron deficiency anemia, unspecified: Secondary | ICD-10-CM | POA: Diagnosis not present

## 2023-06-23 DIAGNOSIS — N2581 Secondary hyperparathyroidism of renal origin: Secondary | ICD-10-CM | POA: Diagnosis not present

## 2023-06-23 DIAGNOSIS — Z992 Dependence on renal dialysis: Secondary | ICD-10-CM | POA: Diagnosis not present

## 2023-06-23 DIAGNOSIS — D509 Iron deficiency anemia, unspecified: Secondary | ICD-10-CM | POA: Diagnosis not present

## 2023-06-23 DIAGNOSIS — D689 Coagulation defect, unspecified: Secondary | ICD-10-CM | POA: Diagnosis not present

## 2023-06-23 DIAGNOSIS — N186 End stage renal disease: Secondary | ICD-10-CM | POA: Diagnosis not present

## 2023-06-23 DIAGNOSIS — E1129 Type 2 diabetes mellitus with other diabetic kidney complication: Secondary | ICD-10-CM | POA: Diagnosis not present

## 2023-06-23 DIAGNOSIS — D631 Anemia in chronic kidney disease: Secondary | ICD-10-CM | POA: Diagnosis not present

## 2023-06-25 DIAGNOSIS — E1129 Type 2 diabetes mellitus with other diabetic kidney complication: Secondary | ICD-10-CM | POA: Diagnosis not present

## 2023-06-25 DIAGNOSIS — D689 Coagulation defect, unspecified: Secondary | ICD-10-CM | POA: Diagnosis not present

## 2023-06-25 DIAGNOSIS — N2581 Secondary hyperparathyroidism of renal origin: Secondary | ICD-10-CM | POA: Diagnosis not present

## 2023-06-25 DIAGNOSIS — D631 Anemia in chronic kidney disease: Secondary | ICD-10-CM | POA: Diagnosis not present

## 2023-06-25 DIAGNOSIS — Z992 Dependence on renal dialysis: Secondary | ICD-10-CM | POA: Diagnosis not present

## 2023-06-25 DIAGNOSIS — N186 End stage renal disease: Secondary | ICD-10-CM | POA: Diagnosis not present

## 2023-06-25 DIAGNOSIS — D509 Iron deficiency anemia, unspecified: Secondary | ICD-10-CM | POA: Diagnosis not present

## 2023-06-28 DIAGNOSIS — Z992 Dependence on renal dialysis: Secondary | ICD-10-CM | POA: Diagnosis not present

## 2023-06-28 DIAGNOSIS — D509 Iron deficiency anemia, unspecified: Secondary | ICD-10-CM | POA: Diagnosis not present

## 2023-06-28 DIAGNOSIS — N186 End stage renal disease: Secondary | ICD-10-CM | POA: Diagnosis not present

## 2023-06-28 DIAGNOSIS — N2581 Secondary hyperparathyroidism of renal origin: Secondary | ICD-10-CM | POA: Diagnosis not present

## 2023-06-28 DIAGNOSIS — D689 Coagulation defect, unspecified: Secondary | ICD-10-CM | POA: Diagnosis not present

## 2023-06-28 DIAGNOSIS — D631 Anemia in chronic kidney disease: Secondary | ICD-10-CM | POA: Diagnosis not present

## 2023-06-28 DIAGNOSIS — E1129 Type 2 diabetes mellitus with other diabetic kidney complication: Secondary | ICD-10-CM | POA: Diagnosis not present

## 2023-06-30 DIAGNOSIS — N2581 Secondary hyperparathyroidism of renal origin: Secondary | ICD-10-CM | POA: Diagnosis not present

## 2023-06-30 DIAGNOSIS — D689 Coagulation defect, unspecified: Secondary | ICD-10-CM | POA: Diagnosis not present

## 2023-06-30 DIAGNOSIS — N186 End stage renal disease: Secondary | ICD-10-CM | POA: Diagnosis not present

## 2023-06-30 DIAGNOSIS — D509 Iron deficiency anemia, unspecified: Secondary | ICD-10-CM | POA: Diagnosis not present

## 2023-06-30 DIAGNOSIS — D631 Anemia in chronic kidney disease: Secondary | ICD-10-CM | POA: Diagnosis not present

## 2023-06-30 DIAGNOSIS — Z992 Dependence on renal dialysis: Secondary | ICD-10-CM | POA: Diagnosis not present

## 2023-06-30 DIAGNOSIS — E1129 Type 2 diabetes mellitus with other diabetic kidney complication: Secondary | ICD-10-CM | POA: Diagnosis not present

## 2023-07-02 DIAGNOSIS — N2581 Secondary hyperparathyroidism of renal origin: Secondary | ICD-10-CM | POA: Diagnosis not present

## 2023-07-02 DIAGNOSIS — Z992 Dependence on renal dialysis: Secondary | ICD-10-CM | POA: Diagnosis not present

## 2023-07-02 DIAGNOSIS — D689 Coagulation defect, unspecified: Secondary | ICD-10-CM | POA: Diagnosis not present

## 2023-07-02 DIAGNOSIS — E1129 Type 2 diabetes mellitus with other diabetic kidney complication: Secondary | ICD-10-CM | POA: Diagnosis not present

## 2023-07-02 DIAGNOSIS — D631 Anemia in chronic kidney disease: Secondary | ICD-10-CM | POA: Diagnosis not present

## 2023-07-02 DIAGNOSIS — D509 Iron deficiency anemia, unspecified: Secondary | ICD-10-CM | POA: Diagnosis not present

## 2023-07-02 DIAGNOSIS — N186 End stage renal disease: Secondary | ICD-10-CM | POA: Diagnosis not present

## 2023-07-04 DIAGNOSIS — D631 Anemia in chronic kidney disease: Secondary | ICD-10-CM | POA: Diagnosis not present

## 2023-07-04 DIAGNOSIS — N2581 Secondary hyperparathyroidism of renal origin: Secondary | ICD-10-CM | POA: Diagnosis not present

## 2023-07-04 DIAGNOSIS — D509 Iron deficiency anemia, unspecified: Secondary | ICD-10-CM | POA: Diagnosis not present

## 2023-07-04 DIAGNOSIS — D689 Coagulation defect, unspecified: Secondary | ICD-10-CM | POA: Diagnosis not present

## 2023-07-04 DIAGNOSIS — Z992 Dependence on renal dialysis: Secondary | ICD-10-CM | POA: Diagnosis not present

## 2023-07-04 DIAGNOSIS — N186 End stage renal disease: Secondary | ICD-10-CM | POA: Diagnosis not present

## 2023-07-04 DIAGNOSIS — E1129 Type 2 diabetes mellitus with other diabetic kidney complication: Secondary | ICD-10-CM | POA: Diagnosis not present

## 2023-07-07 DIAGNOSIS — N2581 Secondary hyperparathyroidism of renal origin: Secondary | ICD-10-CM | POA: Diagnosis not present

## 2023-07-07 DIAGNOSIS — Z992 Dependence on renal dialysis: Secondary | ICD-10-CM | POA: Diagnosis not present

## 2023-07-07 DIAGNOSIS — E1129 Type 2 diabetes mellitus with other diabetic kidney complication: Secondary | ICD-10-CM | POA: Diagnosis not present

## 2023-07-07 DIAGNOSIS — D631 Anemia in chronic kidney disease: Secondary | ICD-10-CM | POA: Diagnosis not present

## 2023-07-07 DIAGNOSIS — D689 Coagulation defect, unspecified: Secondary | ICD-10-CM | POA: Diagnosis not present

## 2023-07-07 DIAGNOSIS — N186 End stage renal disease: Secondary | ICD-10-CM | POA: Diagnosis not present

## 2023-07-07 DIAGNOSIS — D509 Iron deficiency anemia, unspecified: Secondary | ICD-10-CM | POA: Diagnosis not present

## 2023-07-09 DIAGNOSIS — D509 Iron deficiency anemia, unspecified: Secondary | ICD-10-CM | POA: Diagnosis not present

## 2023-07-09 DIAGNOSIS — D689 Coagulation defect, unspecified: Secondary | ICD-10-CM | POA: Diagnosis not present

## 2023-07-09 DIAGNOSIS — N2581 Secondary hyperparathyroidism of renal origin: Secondary | ICD-10-CM | POA: Diagnosis not present

## 2023-07-09 DIAGNOSIS — D631 Anemia in chronic kidney disease: Secondary | ICD-10-CM | POA: Diagnosis not present

## 2023-07-09 DIAGNOSIS — E1129 Type 2 diabetes mellitus with other diabetic kidney complication: Secondary | ICD-10-CM | POA: Diagnosis not present

## 2023-07-09 DIAGNOSIS — Z992 Dependence on renal dialysis: Secondary | ICD-10-CM | POA: Diagnosis not present

## 2023-07-09 DIAGNOSIS — N186 End stage renal disease: Secondary | ICD-10-CM | POA: Diagnosis not present

## 2023-07-11 DIAGNOSIS — D689 Coagulation defect, unspecified: Secondary | ICD-10-CM | POA: Diagnosis not present

## 2023-07-11 DIAGNOSIS — D631 Anemia in chronic kidney disease: Secondary | ICD-10-CM | POA: Diagnosis not present

## 2023-07-11 DIAGNOSIS — N2581 Secondary hyperparathyroidism of renal origin: Secondary | ICD-10-CM | POA: Diagnosis not present

## 2023-07-11 DIAGNOSIS — N186 End stage renal disease: Secondary | ICD-10-CM | POA: Diagnosis not present

## 2023-07-11 DIAGNOSIS — Z992 Dependence on renal dialysis: Secondary | ICD-10-CM | POA: Diagnosis not present

## 2023-07-11 DIAGNOSIS — D509 Iron deficiency anemia, unspecified: Secondary | ICD-10-CM | POA: Diagnosis not present

## 2023-07-11 DIAGNOSIS — E1129 Type 2 diabetes mellitus with other diabetic kidney complication: Secondary | ICD-10-CM | POA: Diagnosis not present

## 2023-07-12 DIAGNOSIS — N186 End stage renal disease: Secondary | ICD-10-CM | POA: Diagnosis not present

## 2023-07-12 DIAGNOSIS — Z992 Dependence on renal dialysis: Secondary | ICD-10-CM | POA: Diagnosis not present

## 2023-07-12 DIAGNOSIS — E1122 Type 2 diabetes mellitus with diabetic chronic kidney disease: Secondary | ICD-10-CM | POA: Diagnosis not present

## 2023-07-14 DIAGNOSIS — N186 End stage renal disease: Secondary | ICD-10-CM | POA: Diagnosis not present

## 2023-07-14 DIAGNOSIS — D509 Iron deficiency anemia, unspecified: Secondary | ICD-10-CM | POA: Diagnosis not present

## 2023-07-14 DIAGNOSIS — D631 Anemia in chronic kidney disease: Secondary | ICD-10-CM | POA: Diagnosis not present

## 2023-07-14 DIAGNOSIS — E1129 Type 2 diabetes mellitus with other diabetic kidney complication: Secondary | ICD-10-CM | POA: Diagnosis not present

## 2023-07-14 DIAGNOSIS — D689 Coagulation defect, unspecified: Secondary | ICD-10-CM | POA: Diagnosis not present

## 2023-07-14 DIAGNOSIS — N2581 Secondary hyperparathyroidism of renal origin: Secondary | ICD-10-CM | POA: Diagnosis not present

## 2023-07-14 DIAGNOSIS — Z992 Dependence on renal dialysis: Secondary | ICD-10-CM | POA: Diagnosis not present

## 2023-07-16 DIAGNOSIS — D631 Anemia in chronic kidney disease: Secondary | ICD-10-CM | POA: Diagnosis not present

## 2023-07-16 DIAGNOSIS — Z992 Dependence on renal dialysis: Secondary | ICD-10-CM | POA: Diagnosis not present

## 2023-07-16 DIAGNOSIS — N2581 Secondary hyperparathyroidism of renal origin: Secondary | ICD-10-CM | POA: Diagnosis not present

## 2023-07-16 DIAGNOSIS — E1129 Type 2 diabetes mellitus with other diabetic kidney complication: Secondary | ICD-10-CM | POA: Diagnosis not present

## 2023-07-16 DIAGNOSIS — D509 Iron deficiency anemia, unspecified: Secondary | ICD-10-CM | POA: Diagnosis not present

## 2023-07-16 DIAGNOSIS — N186 End stage renal disease: Secondary | ICD-10-CM | POA: Diagnosis not present

## 2023-07-16 DIAGNOSIS — D689 Coagulation defect, unspecified: Secondary | ICD-10-CM | POA: Diagnosis not present

## 2023-07-19 DIAGNOSIS — D509 Iron deficiency anemia, unspecified: Secondary | ICD-10-CM | POA: Diagnosis not present

## 2023-07-19 DIAGNOSIS — Z992 Dependence on renal dialysis: Secondary | ICD-10-CM | POA: Diagnosis not present

## 2023-07-19 DIAGNOSIS — D689 Coagulation defect, unspecified: Secondary | ICD-10-CM | POA: Diagnosis not present

## 2023-07-19 DIAGNOSIS — N186 End stage renal disease: Secondary | ICD-10-CM | POA: Diagnosis not present

## 2023-07-19 DIAGNOSIS — D631 Anemia in chronic kidney disease: Secondary | ICD-10-CM | POA: Diagnosis not present

## 2023-07-19 DIAGNOSIS — E1129 Type 2 diabetes mellitus with other diabetic kidney complication: Secondary | ICD-10-CM | POA: Diagnosis not present

## 2023-07-19 DIAGNOSIS — N2581 Secondary hyperparathyroidism of renal origin: Secondary | ICD-10-CM | POA: Diagnosis not present

## 2023-07-21 DIAGNOSIS — D509 Iron deficiency anemia, unspecified: Secondary | ICD-10-CM | POA: Diagnosis not present

## 2023-07-21 DIAGNOSIS — D631 Anemia in chronic kidney disease: Secondary | ICD-10-CM | POA: Diagnosis not present

## 2023-07-21 DIAGNOSIS — N186 End stage renal disease: Secondary | ICD-10-CM | POA: Diagnosis not present

## 2023-07-21 DIAGNOSIS — Z992 Dependence on renal dialysis: Secondary | ICD-10-CM | POA: Diagnosis not present

## 2023-07-21 DIAGNOSIS — E1129 Type 2 diabetes mellitus with other diabetic kidney complication: Secondary | ICD-10-CM | POA: Diagnosis not present

## 2023-07-21 DIAGNOSIS — D689 Coagulation defect, unspecified: Secondary | ICD-10-CM | POA: Diagnosis not present

## 2023-07-21 DIAGNOSIS — N2581 Secondary hyperparathyroidism of renal origin: Secondary | ICD-10-CM | POA: Diagnosis not present

## 2023-07-26 DIAGNOSIS — Z992 Dependence on renal dialysis: Secondary | ICD-10-CM | POA: Diagnosis not present

## 2023-07-26 DIAGNOSIS — D509 Iron deficiency anemia, unspecified: Secondary | ICD-10-CM | POA: Diagnosis not present

## 2023-07-26 DIAGNOSIS — E1129 Type 2 diabetes mellitus with other diabetic kidney complication: Secondary | ICD-10-CM | POA: Diagnosis not present

## 2023-07-26 DIAGNOSIS — N186 End stage renal disease: Secondary | ICD-10-CM | POA: Diagnosis not present

## 2023-07-26 DIAGNOSIS — D689 Coagulation defect, unspecified: Secondary | ICD-10-CM | POA: Diagnosis not present

## 2023-07-26 DIAGNOSIS — N2581 Secondary hyperparathyroidism of renal origin: Secondary | ICD-10-CM | POA: Diagnosis not present

## 2023-07-26 DIAGNOSIS — D631 Anemia in chronic kidney disease: Secondary | ICD-10-CM | POA: Diagnosis not present

## 2023-07-28 DIAGNOSIS — N2581 Secondary hyperparathyroidism of renal origin: Secondary | ICD-10-CM | POA: Diagnosis not present

## 2023-07-28 DIAGNOSIS — D631 Anemia in chronic kidney disease: Secondary | ICD-10-CM | POA: Diagnosis not present

## 2023-07-28 DIAGNOSIS — E1129 Type 2 diabetes mellitus with other diabetic kidney complication: Secondary | ICD-10-CM | POA: Diagnosis not present

## 2023-07-28 DIAGNOSIS — D509 Iron deficiency anemia, unspecified: Secondary | ICD-10-CM | POA: Diagnosis not present

## 2023-07-28 DIAGNOSIS — Z992 Dependence on renal dialysis: Secondary | ICD-10-CM | POA: Diagnosis not present

## 2023-07-28 DIAGNOSIS — N186 End stage renal disease: Secondary | ICD-10-CM | POA: Diagnosis not present

## 2023-07-28 DIAGNOSIS — D689 Coagulation defect, unspecified: Secondary | ICD-10-CM | POA: Diagnosis not present

## 2023-07-30 DIAGNOSIS — N186 End stage renal disease: Secondary | ICD-10-CM | POA: Diagnosis not present

## 2023-07-30 DIAGNOSIS — E1129 Type 2 diabetes mellitus with other diabetic kidney complication: Secondary | ICD-10-CM | POA: Diagnosis not present

## 2023-07-30 DIAGNOSIS — D509 Iron deficiency anemia, unspecified: Secondary | ICD-10-CM | POA: Diagnosis not present

## 2023-07-30 DIAGNOSIS — N2581 Secondary hyperparathyroidism of renal origin: Secondary | ICD-10-CM | POA: Diagnosis not present

## 2023-07-30 DIAGNOSIS — D631 Anemia in chronic kidney disease: Secondary | ICD-10-CM | POA: Diagnosis not present

## 2023-07-30 DIAGNOSIS — Z992 Dependence on renal dialysis: Secondary | ICD-10-CM | POA: Diagnosis not present

## 2023-07-30 DIAGNOSIS — D689 Coagulation defect, unspecified: Secondary | ICD-10-CM | POA: Diagnosis not present

## 2023-08-02 DIAGNOSIS — Z992 Dependence on renal dialysis: Secondary | ICD-10-CM | POA: Diagnosis not present

## 2023-08-02 DIAGNOSIS — N2581 Secondary hyperparathyroidism of renal origin: Secondary | ICD-10-CM | POA: Diagnosis not present

## 2023-08-02 DIAGNOSIS — D689 Coagulation defect, unspecified: Secondary | ICD-10-CM | POA: Diagnosis not present

## 2023-08-02 DIAGNOSIS — D631 Anemia in chronic kidney disease: Secondary | ICD-10-CM | POA: Diagnosis not present

## 2023-08-02 DIAGNOSIS — N186 End stage renal disease: Secondary | ICD-10-CM | POA: Diagnosis not present

## 2023-08-02 DIAGNOSIS — D509 Iron deficiency anemia, unspecified: Secondary | ICD-10-CM | POA: Diagnosis not present

## 2023-08-02 DIAGNOSIS — E1129 Type 2 diabetes mellitus with other diabetic kidney complication: Secondary | ICD-10-CM | POA: Diagnosis not present

## 2023-08-04 DIAGNOSIS — D689 Coagulation defect, unspecified: Secondary | ICD-10-CM | POA: Diagnosis not present

## 2023-08-04 DIAGNOSIS — D509 Iron deficiency anemia, unspecified: Secondary | ICD-10-CM | POA: Diagnosis not present

## 2023-08-04 DIAGNOSIS — D631 Anemia in chronic kidney disease: Secondary | ICD-10-CM | POA: Diagnosis not present

## 2023-08-04 DIAGNOSIS — Z992 Dependence on renal dialysis: Secondary | ICD-10-CM | POA: Diagnosis not present

## 2023-08-04 DIAGNOSIS — N186 End stage renal disease: Secondary | ICD-10-CM | POA: Diagnosis not present

## 2023-08-04 DIAGNOSIS — N2581 Secondary hyperparathyroidism of renal origin: Secondary | ICD-10-CM | POA: Diagnosis not present

## 2023-08-04 DIAGNOSIS — E1129 Type 2 diabetes mellitus with other diabetic kidney complication: Secondary | ICD-10-CM | POA: Diagnosis not present

## 2023-08-06 DIAGNOSIS — N186 End stage renal disease: Secondary | ICD-10-CM | POA: Diagnosis not present

## 2023-08-06 DIAGNOSIS — Z992 Dependence on renal dialysis: Secondary | ICD-10-CM | POA: Diagnosis not present

## 2023-08-06 DIAGNOSIS — E1129 Type 2 diabetes mellitus with other diabetic kidney complication: Secondary | ICD-10-CM | POA: Diagnosis not present

## 2023-08-06 DIAGNOSIS — D689 Coagulation defect, unspecified: Secondary | ICD-10-CM | POA: Diagnosis not present

## 2023-08-06 DIAGNOSIS — D509 Iron deficiency anemia, unspecified: Secondary | ICD-10-CM | POA: Diagnosis not present

## 2023-08-06 DIAGNOSIS — D631 Anemia in chronic kidney disease: Secondary | ICD-10-CM | POA: Diagnosis not present

## 2023-08-06 DIAGNOSIS — N2581 Secondary hyperparathyroidism of renal origin: Secondary | ICD-10-CM | POA: Diagnosis not present

## 2023-08-09 DIAGNOSIS — E1129 Type 2 diabetes mellitus with other diabetic kidney complication: Secondary | ICD-10-CM | POA: Diagnosis not present

## 2023-08-09 DIAGNOSIS — N186 End stage renal disease: Secondary | ICD-10-CM | POA: Diagnosis not present

## 2023-08-09 DIAGNOSIS — Z992 Dependence on renal dialysis: Secondary | ICD-10-CM | POA: Diagnosis not present

## 2023-08-09 DIAGNOSIS — N2581 Secondary hyperparathyroidism of renal origin: Secondary | ICD-10-CM | POA: Diagnosis not present

## 2023-08-09 DIAGNOSIS — D689 Coagulation defect, unspecified: Secondary | ICD-10-CM | POA: Diagnosis not present

## 2023-08-09 DIAGNOSIS — D631 Anemia in chronic kidney disease: Secondary | ICD-10-CM | POA: Diagnosis not present

## 2023-08-09 DIAGNOSIS — D509 Iron deficiency anemia, unspecified: Secondary | ICD-10-CM | POA: Diagnosis not present

## 2023-08-11 DIAGNOSIS — N2581 Secondary hyperparathyroidism of renal origin: Secondary | ICD-10-CM | POA: Diagnosis not present

## 2023-08-11 DIAGNOSIS — D509 Iron deficiency anemia, unspecified: Secondary | ICD-10-CM | POA: Diagnosis not present

## 2023-08-11 DIAGNOSIS — Z992 Dependence on renal dialysis: Secondary | ICD-10-CM | POA: Diagnosis not present

## 2023-08-11 DIAGNOSIS — E1129 Type 2 diabetes mellitus with other diabetic kidney complication: Secondary | ICD-10-CM | POA: Diagnosis not present

## 2023-08-11 DIAGNOSIS — D631 Anemia in chronic kidney disease: Secondary | ICD-10-CM | POA: Diagnosis not present

## 2023-08-11 DIAGNOSIS — D689 Coagulation defect, unspecified: Secondary | ICD-10-CM | POA: Diagnosis not present

## 2023-08-11 DIAGNOSIS — N186 End stage renal disease: Secondary | ICD-10-CM | POA: Diagnosis not present

## 2023-08-12 DIAGNOSIS — E1122 Type 2 diabetes mellitus with diabetic chronic kidney disease: Secondary | ICD-10-CM | POA: Diagnosis not present

## 2023-08-12 DIAGNOSIS — N186 End stage renal disease: Secondary | ICD-10-CM | POA: Diagnosis not present

## 2023-08-12 DIAGNOSIS — Z992 Dependence on renal dialysis: Secondary | ICD-10-CM | POA: Diagnosis not present

## 2023-08-13 DIAGNOSIS — Z992 Dependence on renal dialysis: Secondary | ICD-10-CM | POA: Diagnosis not present

## 2023-08-13 DIAGNOSIS — D509 Iron deficiency anemia, unspecified: Secondary | ICD-10-CM | POA: Diagnosis not present

## 2023-08-13 DIAGNOSIS — N186 End stage renal disease: Secondary | ICD-10-CM | POA: Diagnosis not present

## 2023-08-13 DIAGNOSIS — D631 Anemia in chronic kidney disease: Secondary | ICD-10-CM | POA: Diagnosis not present

## 2023-08-13 DIAGNOSIS — E1129 Type 2 diabetes mellitus with other diabetic kidney complication: Secondary | ICD-10-CM | POA: Diagnosis not present

## 2023-08-13 DIAGNOSIS — N2581 Secondary hyperparathyroidism of renal origin: Secondary | ICD-10-CM | POA: Diagnosis not present

## 2023-08-13 DIAGNOSIS — D689 Coagulation defect, unspecified: Secondary | ICD-10-CM | POA: Diagnosis not present

## 2023-08-16 DIAGNOSIS — E1129 Type 2 diabetes mellitus with other diabetic kidney complication: Secondary | ICD-10-CM | POA: Diagnosis not present

## 2023-08-16 DIAGNOSIS — N2581 Secondary hyperparathyroidism of renal origin: Secondary | ICD-10-CM | POA: Diagnosis not present

## 2023-08-16 DIAGNOSIS — Z992 Dependence on renal dialysis: Secondary | ICD-10-CM | POA: Diagnosis not present

## 2023-08-16 DIAGNOSIS — D689 Coagulation defect, unspecified: Secondary | ICD-10-CM | POA: Diagnosis not present

## 2023-08-16 DIAGNOSIS — D509 Iron deficiency anemia, unspecified: Secondary | ICD-10-CM | POA: Diagnosis not present

## 2023-08-16 DIAGNOSIS — N186 End stage renal disease: Secondary | ICD-10-CM | POA: Diagnosis not present

## 2023-08-16 DIAGNOSIS — D631 Anemia in chronic kidney disease: Secondary | ICD-10-CM | POA: Diagnosis not present

## 2023-08-18 DIAGNOSIS — D509 Iron deficiency anemia, unspecified: Secondary | ICD-10-CM | POA: Diagnosis not present

## 2023-08-18 DIAGNOSIS — Z992 Dependence on renal dialysis: Secondary | ICD-10-CM | POA: Diagnosis not present

## 2023-08-18 DIAGNOSIS — D689 Coagulation defect, unspecified: Secondary | ICD-10-CM | POA: Diagnosis not present

## 2023-08-18 DIAGNOSIS — N2581 Secondary hyperparathyroidism of renal origin: Secondary | ICD-10-CM | POA: Diagnosis not present

## 2023-08-18 DIAGNOSIS — D631 Anemia in chronic kidney disease: Secondary | ICD-10-CM | POA: Diagnosis not present

## 2023-08-18 DIAGNOSIS — N186 End stage renal disease: Secondary | ICD-10-CM | POA: Diagnosis not present

## 2023-08-18 DIAGNOSIS — E1129 Type 2 diabetes mellitus with other diabetic kidney complication: Secondary | ICD-10-CM | POA: Diagnosis not present

## 2023-08-20 DIAGNOSIS — Z992 Dependence on renal dialysis: Secondary | ICD-10-CM | POA: Diagnosis not present

## 2023-08-20 DIAGNOSIS — D509 Iron deficiency anemia, unspecified: Secondary | ICD-10-CM | POA: Diagnosis not present

## 2023-08-20 DIAGNOSIS — N186 End stage renal disease: Secondary | ICD-10-CM | POA: Diagnosis not present

## 2023-08-20 DIAGNOSIS — D689 Coagulation defect, unspecified: Secondary | ICD-10-CM | POA: Diagnosis not present

## 2023-08-20 DIAGNOSIS — E1129 Type 2 diabetes mellitus with other diabetic kidney complication: Secondary | ICD-10-CM | POA: Diagnosis not present

## 2023-08-20 DIAGNOSIS — N2581 Secondary hyperparathyroidism of renal origin: Secondary | ICD-10-CM | POA: Diagnosis not present

## 2023-08-20 DIAGNOSIS — D631 Anemia in chronic kidney disease: Secondary | ICD-10-CM | POA: Diagnosis not present

## 2023-08-23 DIAGNOSIS — E1129 Type 2 diabetes mellitus with other diabetic kidney complication: Secondary | ICD-10-CM | POA: Diagnosis not present

## 2023-08-23 DIAGNOSIS — N2581 Secondary hyperparathyroidism of renal origin: Secondary | ICD-10-CM | POA: Diagnosis not present

## 2023-08-23 DIAGNOSIS — N186 End stage renal disease: Secondary | ICD-10-CM | POA: Diagnosis not present

## 2023-08-23 DIAGNOSIS — D631 Anemia in chronic kidney disease: Secondary | ICD-10-CM | POA: Diagnosis not present

## 2023-08-23 DIAGNOSIS — D689 Coagulation defect, unspecified: Secondary | ICD-10-CM | POA: Diagnosis not present

## 2023-08-23 DIAGNOSIS — D509 Iron deficiency anemia, unspecified: Secondary | ICD-10-CM | POA: Diagnosis not present

## 2023-08-23 DIAGNOSIS — Z992 Dependence on renal dialysis: Secondary | ICD-10-CM | POA: Diagnosis not present

## 2023-08-25 DIAGNOSIS — D689 Coagulation defect, unspecified: Secondary | ICD-10-CM | POA: Diagnosis not present

## 2023-08-25 DIAGNOSIS — Z992 Dependence on renal dialysis: Secondary | ICD-10-CM | POA: Diagnosis not present

## 2023-08-25 DIAGNOSIS — D509 Iron deficiency anemia, unspecified: Secondary | ICD-10-CM | POA: Diagnosis not present

## 2023-08-25 DIAGNOSIS — D631 Anemia in chronic kidney disease: Secondary | ICD-10-CM | POA: Diagnosis not present

## 2023-08-25 DIAGNOSIS — N2581 Secondary hyperparathyroidism of renal origin: Secondary | ICD-10-CM | POA: Diagnosis not present

## 2023-08-25 DIAGNOSIS — N186 End stage renal disease: Secondary | ICD-10-CM | POA: Diagnosis not present

## 2023-08-25 DIAGNOSIS — E1129 Type 2 diabetes mellitus with other diabetic kidney complication: Secondary | ICD-10-CM | POA: Diagnosis not present

## 2023-08-27 DIAGNOSIS — Z992 Dependence on renal dialysis: Secondary | ICD-10-CM | POA: Diagnosis not present

## 2023-08-27 DIAGNOSIS — D509 Iron deficiency anemia, unspecified: Secondary | ICD-10-CM | POA: Diagnosis not present

## 2023-08-27 DIAGNOSIS — E1129 Type 2 diabetes mellitus with other diabetic kidney complication: Secondary | ICD-10-CM | POA: Diagnosis not present

## 2023-08-27 DIAGNOSIS — D689 Coagulation defect, unspecified: Secondary | ICD-10-CM | POA: Diagnosis not present

## 2023-08-27 DIAGNOSIS — N186 End stage renal disease: Secondary | ICD-10-CM | POA: Diagnosis not present

## 2023-08-27 DIAGNOSIS — N2581 Secondary hyperparathyroidism of renal origin: Secondary | ICD-10-CM | POA: Diagnosis not present

## 2023-08-27 DIAGNOSIS — D631 Anemia in chronic kidney disease: Secondary | ICD-10-CM | POA: Diagnosis not present

## 2023-08-29 ENCOUNTER — Encounter: Payer: Self-pay | Admitting: Podiatry

## 2023-08-29 ENCOUNTER — Ambulatory Visit (INDEPENDENT_AMBULATORY_CARE_PROVIDER_SITE_OTHER): Payer: Medicare Other | Admitting: Podiatry

## 2023-08-29 DIAGNOSIS — M79676 Pain in unspecified toe(s): Secondary | ICD-10-CM | POA: Diagnosis not present

## 2023-08-29 DIAGNOSIS — E1142 Type 2 diabetes mellitus with diabetic polyneuropathy: Secondary | ICD-10-CM

## 2023-08-29 DIAGNOSIS — B351 Tinea unguium: Secondary | ICD-10-CM

## 2023-08-29 DIAGNOSIS — Q828 Other specified congenital malformations of skin: Secondary | ICD-10-CM | POA: Diagnosis not present

## 2023-08-29 NOTE — Progress Notes (Signed)
This patient returns to my office for at risk foot care.  This patient requires this care by a professional since this patient will be at risk due to having  ESRD, CKD and diabetes.  This patient is unable to cut nails himself since the patient cannot reach his nails.These nails are painful walking and wearing shoes.  This patient presents for at risk foot care today.  General Appearance  Alert, conversant and in no acute stress.  Vascular  Dorsalis pedis and posterior tibial  pulses are palpable  bilaterally.  Capillary return is within normal limits  bilaterally. Temperature is within normal limits  bilaterally.  Neurologic  Senn-Weinstein monofilament wire test diminished   bilaterally. Muscle power within normal limits bilaterally.  Nails Thick disfigured discolored nails with subungual debris  from hallux to fifth toes bilaterally. No evidence of bacterial infection or drainage bilaterally.  Orthopedic  No limitations of motion  feet .  No crepitus or effusions noted.  No bony pathology or digital deformities noted.  Skin  normotropic skin  noted bilaterally.  No signs of infections or ulcers noted.   Porokeratosis sub 4  B/L.   symptomatic.  Onychomycosis  Pain in right toes  Pain in left toes  Porokeratosis sub 4 left foot.  Consent was obtained for treatment procedures.   Mechanical debridement of nails 1-5  bilaterally performed with a nail nipper.  Filed with dremel without incident.  Debride porokeratosis with # 15 blade and dremel tool.   Return office visit   9 weeks                  Told patient to return for periodic foot care and evaluation due to potential at risk complications.   Gardiner Barefoot DPM

## 2023-08-30 DIAGNOSIS — D689 Coagulation defect, unspecified: Secondary | ICD-10-CM | POA: Diagnosis not present

## 2023-08-30 DIAGNOSIS — N186 End stage renal disease: Secondary | ICD-10-CM | POA: Diagnosis not present

## 2023-08-30 DIAGNOSIS — Z992 Dependence on renal dialysis: Secondary | ICD-10-CM | POA: Diagnosis not present

## 2023-08-30 DIAGNOSIS — D509 Iron deficiency anemia, unspecified: Secondary | ICD-10-CM | POA: Diagnosis not present

## 2023-08-30 DIAGNOSIS — E1129 Type 2 diabetes mellitus with other diabetic kidney complication: Secondary | ICD-10-CM | POA: Diagnosis not present

## 2023-08-30 DIAGNOSIS — D631 Anemia in chronic kidney disease: Secondary | ICD-10-CM | POA: Diagnosis not present

## 2023-08-30 DIAGNOSIS — N2581 Secondary hyperparathyroidism of renal origin: Secondary | ICD-10-CM | POA: Diagnosis not present

## 2023-09-01 DIAGNOSIS — D631 Anemia in chronic kidney disease: Secondary | ICD-10-CM | POA: Diagnosis not present

## 2023-09-01 DIAGNOSIS — N2581 Secondary hyperparathyroidism of renal origin: Secondary | ICD-10-CM | POA: Diagnosis not present

## 2023-09-01 DIAGNOSIS — N186 End stage renal disease: Secondary | ICD-10-CM | POA: Diagnosis not present

## 2023-09-01 DIAGNOSIS — D509 Iron deficiency anemia, unspecified: Secondary | ICD-10-CM | POA: Diagnosis not present

## 2023-09-01 DIAGNOSIS — Z992 Dependence on renal dialysis: Secondary | ICD-10-CM | POA: Diagnosis not present

## 2023-09-01 DIAGNOSIS — D689 Coagulation defect, unspecified: Secondary | ICD-10-CM | POA: Diagnosis not present

## 2023-09-01 DIAGNOSIS — E1129 Type 2 diabetes mellitus with other diabetic kidney complication: Secondary | ICD-10-CM | POA: Diagnosis not present

## 2023-09-03 DIAGNOSIS — N2581 Secondary hyperparathyroidism of renal origin: Secondary | ICD-10-CM | POA: Diagnosis not present

## 2023-09-03 DIAGNOSIS — E1129 Type 2 diabetes mellitus with other diabetic kidney complication: Secondary | ICD-10-CM | POA: Diagnosis not present

## 2023-09-03 DIAGNOSIS — N186 End stage renal disease: Secondary | ICD-10-CM | POA: Diagnosis not present

## 2023-09-03 DIAGNOSIS — Z992 Dependence on renal dialysis: Secondary | ICD-10-CM | POA: Diagnosis not present

## 2023-09-03 DIAGNOSIS — D631 Anemia in chronic kidney disease: Secondary | ICD-10-CM | POA: Diagnosis not present

## 2023-09-03 DIAGNOSIS — D509 Iron deficiency anemia, unspecified: Secondary | ICD-10-CM | POA: Diagnosis not present

## 2023-09-03 DIAGNOSIS — D689 Coagulation defect, unspecified: Secondary | ICD-10-CM | POA: Diagnosis not present

## 2023-09-06 DIAGNOSIS — E1129 Type 2 diabetes mellitus with other diabetic kidney complication: Secondary | ICD-10-CM | POA: Diagnosis not present

## 2023-09-06 DIAGNOSIS — D689 Coagulation defect, unspecified: Secondary | ICD-10-CM | POA: Diagnosis not present

## 2023-09-06 DIAGNOSIS — Z992 Dependence on renal dialysis: Secondary | ICD-10-CM | POA: Diagnosis not present

## 2023-09-06 DIAGNOSIS — N2581 Secondary hyperparathyroidism of renal origin: Secondary | ICD-10-CM | POA: Diagnosis not present

## 2023-09-06 DIAGNOSIS — D509 Iron deficiency anemia, unspecified: Secondary | ICD-10-CM | POA: Diagnosis not present

## 2023-09-06 DIAGNOSIS — N186 End stage renal disease: Secondary | ICD-10-CM | POA: Diagnosis not present

## 2023-09-06 DIAGNOSIS — D631 Anemia in chronic kidney disease: Secondary | ICD-10-CM | POA: Diagnosis not present

## 2023-09-08 DIAGNOSIS — D631 Anemia in chronic kidney disease: Secondary | ICD-10-CM | POA: Diagnosis not present

## 2023-09-08 DIAGNOSIS — Z992 Dependence on renal dialysis: Secondary | ICD-10-CM | POA: Diagnosis not present

## 2023-09-08 DIAGNOSIS — E1129 Type 2 diabetes mellitus with other diabetic kidney complication: Secondary | ICD-10-CM | POA: Diagnosis not present

## 2023-09-08 DIAGNOSIS — D689 Coagulation defect, unspecified: Secondary | ICD-10-CM | POA: Diagnosis not present

## 2023-09-08 DIAGNOSIS — N2581 Secondary hyperparathyroidism of renal origin: Secondary | ICD-10-CM | POA: Diagnosis not present

## 2023-09-08 DIAGNOSIS — N186 End stage renal disease: Secondary | ICD-10-CM | POA: Diagnosis not present

## 2023-09-08 DIAGNOSIS — D509 Iron deficiency anemia, unspecified: Secondary | ICD-10-CM | POA: Diagnosis not present

## 2023-09-09 DIAGNOSIS — N186 End stage renal disease: Secondary | ICD-10-CM | POA: Diagnosis not present

## 2023-09-09 DIAGNOSIS — Z992 Dependence on renal dialysis: Secondary | ICD-10-CM | POA: Diagnosis not present

## 2023-09-09 DIAGNOSIS — E1122 Type 2 diabetes mellitus with diabetic chronic kidney disease: Secondary | ICD-10-CM | POA: Diagnosis not present

## 2023-09-10 DIAGNOSIS — Z992 Dependence on renal dialysis: Secondary | ICD-10-CM | POA: Diagnosis not present

## 2023-09-10 DIAGNOSIS — D509 Iron deficiency anemia, unspecified: Secondary | ICD-10-CM | POA: Diagnosis not present

## 2023-09-10 DIAGNOSIS — N186 End stage renal disease: Secondary | ICD-10-CM | POA: Diagnosis not present

## 2023-09-10 DIAGNOSIS — N2581 Secondary hyperparathyroidism of renal origin: Secondary | ICD-10-CM | POA: Diagnosis not present

## 2023-09-10 DIAGNOSIS — E1129 Type 2 diabetes mellitus with other diabetic kidney complication: Secondary | ICD-10-CM | POA: Diagnosis not present

## 2023-09-10 DIAGNOSIS — D689 Coagulation defect, unspecified: Secondary | ICD-10-CM | POA: Diagnosis not present

## 2023-09-13 DIAGNOSIS — E1129 Type 2 diabetes mellitus with other diabetic kidney complication: Secondary | ICD-10-CM | POA: Diagnosis not present

## 2023-09-13 DIAGNOSIS — Z992 Dependence on renal dialysis: Secondary | ICD-10-CM | POA: Diagnosis not present

## 2023-09-13 DIAGNOSIS — N186 End stage renal disease: Secondary | ICD-10-CM | POA: Diagnosis not present

## 2023-09-13 DIAGNOSIS — D689 Coagulation defect, unspecified: Secondary | ICD-10-CM | POA: Diagnosis not present

## 2023-09-13 DIAGNOSIS — D509 Iron deficiency anemia, unspecified: Secondary | ICD-10-CM | POA: Diagnosis not present

## 2023-09-13 DIAGNOSIS — N2581 Secondary hyperparathyroidism of renal origin: Secondary | ICD-10-CM | POA: Diagnosis not present

## 2023-09-15 DIAGNOSIS — Z992 Dependence on renal dialysis: Secondary | ICD-10-CM | POA: Diagnosis not present

## 2023-09-15 DIAGNOSIS — D509 Iron deficiency anemia, unspecified: Secondary | ICD-10-CM | POA: Diagnosis not present

## 2023-09-15 DIAGNOSIS — E1129 Type 2 diabetes mellitus with other diabetic kidney complication: Secondary | ICD-10-CM | POA: Diagnosis not present

## 2023-09-15 DIAGNOSIS — N2581 Secondary hyperparathyroidism of renal origin: Secondary | ICD-10-CM | POA: Diagnosis not present

## 2023-09-15 DIAGNOSIS — D689 Coagulation defect, unspecified: Secondary | ICD-10-CM | POA: Diagnosis not present

## 2023-09-15 DIAGNOSIS — N186 End stage renal disease: Secondary | ICD-10-CM | POA: Diagnosis not present

## 2023-09-17 DIAGNOSIS — D689 Coagulation defect, unspecified: Secondary | ICD-10-CM | POA: Diagnosis not present

## 2023-09-17 DIAGNOSIS — N186 End stage renal disease: Secondary | ICD-10-CM | POA: Diagnosis not present

## 2023-09-17 DIAGNOSIS — E1129 Type 2 diabetes mellitus with other diabetic kidney complication: Secondary | ICD-10-CM | POA: Diagnosis not present

## 2023-09-17 DIAGNOSIS — D509 Iron deficiency anemia, unspecified: Secondary | ICD-10-CM | POA: Diagnosis not present

## 2023-09-17 DIAGNOSIS — N2581 Secondary hyperparathyroidism of renal origin: Secondary | ICD-10-CM | POA: Diagnosis not present

## 2023-09-17 DIAGNOSIS — Z992 Dependence on renal dialysis: Secondary | ICD-10-CM | POA: Diagnosis not present

## 2023-09-20 DIAGNOSIS — Z992 Dependence on renal dialysis: Secondary | ICD-10-CM | POA: Diagnosis not present

## 2023-09-20 DIAGNOSIS — E1129 Type 2 diabetes mellitus with other diabetic kidney complication: Secondary | ICD-10-CM | POA: Diagnosis not present

## 2023-09-20 DIAGNOSIS — D509 Iron deficiency anemia, unspecified: Secondary | ICD-10-CM | POA: Diagnosis not present

## 2023-09-20 DIAGNOSIS — N186 End stage renal disease: Secondary | ICD-10-CM | POA: Diagnosis not present

## 2023-09-20 DIAGNOSIS — D689 Coagulation defect, unspecified: Secondary | ICD-10-CM | POA: Diagnosis not present

## 2023-09-20 DIAGNOSIS — N2581 Secondary hyperparathyroidism of renal origin: Secondary | ICD-10-CM | POA: Diagnosis not present

## 2023-09-22 DIAGNOSIS — N2581 Secondary hyperparathyroidism of renal origin: Secondary | ICD-10-CM | POA: Diagnosis not present

## 2023-09-22 DIAGNOSIS — N186 End stage renal disease: Secondary | ICD-10-CM | POA: Diagnosis not present

## 2023-09-22 DIAGNOSIS — D689 Coagulation defect, unspecified: Secondary | ICD-10-CM | POA: Diagnosis not present

## 2023-09-22 DIAGNOSIS — Z992 Dependence on renal dialysis: Secondary | ICD-10-CM | POA: Diagnosis not present

## 2023-09-22 DIAGNOSIS — D509 Iron deficiency anemia, unspecified: Secondary | ICD-10-CM | POA: Diagnosis not present

## 2023-09-22 DIAGNOSIS — E1129 Type 2 diabetes mellitus with other diabetic kidney complication: Secondary | ICD-10-CM | POA: Diagnosis not present

## 2023-09-24 DIAGNOSIS — Z992 Dependence on renal dialysis: Secondary | ICD-10-CM | POA: Diagnosis not present

## 2023-09-24 DIAGNOSIS — N186 End stage renal disease: Secondary | ICD-10-CM | POA: Diagnosis not present

## 2023-09-24 DIAGNOSIS — N2581 Secondary hyperparathyroidism of renal origin: Secondary | ICD-10-CM | POA: Diagnosis not present

## 2023-09-24 DIAGNOSIS — D689 Coagulation defect, unspecified: Secondary | ICD-10-CM | POA: Diagnosis not present

## 2023-09-24 DIAGNOSIS — D509 Iron deficiency anemia, unspecified: Secondary | ICD-10-CM | POA: Diagnosis not present

## 2023-09-24 DIAGNOSIS — E1129 Type 2 diabetes mellitus with other diabetic kidney complication: Secondary | ICD-10-CM | POA: Diagnosis not present

## 2023-09-27 DIAGNOSIS — N186 End stage renal disease: Secondary | ICD-10-CM | POA: Diagnosis not present

## 2023-09-27 DIAGNOSIS — Z992 Dependence on renal dialysis: Secondary | ICD-10-CM | POA: Diagnosis not present

## 2023-09-27 DIAGNOSIS — N2581 Secondary hyperparathyroidism of renal origin: Secondary | ICD-10-CM | POA: Diagnosis not present

## 2023-09-27 DIAGNOSIS — E1129 Type 2 diabetes mellitus with other diabetic kidney complication: Secondary | ICD-10-CM | POA: Diagnosis not present

## 2023-09-27 DIAGNOSIS — D509 Iron deficiency anemia, unspecified: Secondary | ICD-10-CM | POA: Diagnosis not present

## 2023-09-27 DIAGNOSIS — D689 Coagulation defect, unspecified: Secondary | ICD-10-CM | POA: Diagnosis not present

## 2023-09-29 DIAGNOSIS — N186 End stage renal disease: Secondary | ICD-10-CM | POA: Diagnosis not present

## 2023-09-29 DIAGNOSIS — D689 Coagulation defect, unspecified: Secondary | ICD-10-CM | POA: Diagnosis not present

## 2023-09-29 DIAGNOSIS — Z992 Dependence on renal dialysis: Secondary | ICD-10-CM | POA: Diagnosis not present

## 2023-09-29 DIAGNOSIS — N2581 Secondary hyperparathyroidism of renal origin: Secondary | ICD-10-CM | POA: Diagnosis not present

## 2023-09-29 DIAGNOSIS — E1129 Type 2 diabetes mellitus with other diabetic kidney complication: Secondary | ICD-10-CM | POA: Diagnosis not present

## 2023-09-29 DIAGNOSIS — D509 Iron deficiency anemia, unspecified: Secondary | ICD-10-CM | POA: Diagnosis not present

## 2023-10-01 DIAGNOSIS — N2581 Secondary hyperparathyroidism of renal origin: Secondary | ICD-10-CM | POA: Diagnosis not present

## 2023-10-01 DIAGNOSIS — D689 Coagulation defect, unspecified: Secondary | ICD-10-CM | POA: Diagnosis not present

## 2023-10-01 DIAGNOSIS — D509 Iron deficiency anemia, unspecified: Secondary | ICD-10-CM | POA: Diagnosis not present

## 2023-10-01 DIAGNOSIS — E1129 Type 2 diabetes mellitus with other diabetic kidney complication: Secondary | ICD-10-CM | POA: Diagnosis not present

## 2023-10-01 DIAGNOSIS — Z992 Dependence on renal dialysis: Secondary | ICD-10-CM | POA: Diagnosis not present

## 2023-10-01 DIAGNOSIS — N186 End stage renal disease: Secondary | ICD-10-CM | POA: Diagnosis not present

## 2023-10-04 DIAGNOSIS — E1129 Type 2 diabetes mellitus with other diabetic kidney complication: Secondary | ICD-10-CM | POA: Diagnosis not present

## 2023-10-04 DIAGNOSIS — Z992 Dependence on renal dialysis: Secondary | ICD-10-CM | POA: Diagnosis not present

## 2023-10-04 DIAGNOSIS — D509 Iron deficiency anemia, unspecified: Secondary | ICD-10-CM | POA: Diagnosis not present

## 2023-10-04 DIAGNOSIS — N186 End stage renal disease: Secondary | ICD-10-CM | POA: Diagnosis not present

## 2023-10-04 DIAGNOSIS — N2581 Secondary hyperparathyroidism of renal origin: Secondary | ICD-10-CM | POA: Diagnosis not present

## 2023-10-04 DIAGNOSIS — D689 Coagulation defect, unspecified: Secondary | ICD-10-CM | POA: Diagnosis not present

## 2023-10-06 DIAGNOSIS — D509 Iron deficiency anemia, unspecified: Secondary | ICD-10-CM | POA: Diagnosis not present

## 2023-10-06 DIAGNOSIS — D689 Coagulation defect, unspecified: Secondary | ICD-10-CM | POA: Diagnosis not present

## 2023-10-06 DIAGNOSIS — E1129 Type 2 diabetes mellitus with other diabetic kidney complication: Secondary | ICD-10-CM | POA: Diagnosis not present

## 2023-10-06 DIAGNOSIS — N2581 Secondary hyperparathyroidism of renal origin: Secondary | ICD-10-CM | POA: Diagnosis not present

## 2023-10-06 DIAGNOSIS — N186 End stage renal disease: Secondary | ICD-10-CM | POA: Diagnosis not present

## 2023-10-06 DIAGNOSIS — Z992 Dependence on renal dialysis: Secondary | ICD-10-CM | POA: Diagnosis not present

## 2023-10-08 DIAGNOSIS — Z992 Dependence on renal dialysis: Secondary | ICD-10-CM | POA: Diagnosis not present

## 2023-10-08 DIAGNOSIS — D509 Iron deficiency anemia, unspecified: Secondary | ICD-10-CM | POA: Diagnosis not present

## 2023-10-08 DIAGNOSIS — N186 End stage renal disease: Secondary | ICD-10-CM | POA: Diagnosis not present

## 2023-10-08 DIAGNOSIS — N2581 Secondary hyperparathyroidism of renal origin: Secondary | ICD-10-CM | POA: Diagnosis not present

## 2023-10-08 DIAGNOSIS — E1129 Type 2 diabetes mellitus with other diabetic kidney complication: Secondary | ICD-10-CM | POA: Diagnosis not present

## 2023-10-08 DIAGNOSIS — D689 Coagulation defect, unspecified: Secondary | ICD-10-CM | POA: Diagnosis not present

## 2023-10-10 DIAGNOSIS — E1122 Type 2 diabetes mellitus with diabetic chronic kidney disease: Secondary | ICD-10-CM | POA: Diagnosis not present

## 2023-10-10 DIAGNOSIS — N186 End stage renal disease: Secondary | ICD-10-CM | POA: Diagnosis not present

## 2023-10-10 DIAGNOSIS — Z992 Dependence on renal dialysis: Secondary | ICD-10-CM | POA: Diagnosis not present

## 2023-10-11 DIAGNOSIS — E1129 Type 2 diabetes mellitus with other diabetic kidney complication: Secondary | ICD-10-CM | POA: Diagnosis not present

## 2023-10-11 DIAGNOSIS — D509 Iron deficiency anemia, unspecified: Secondary | ICD-10-CM | POA: Diagnosis not present

## 2023-10-11 DIAGNOSIS — N186 End stage renal disease: Secondary | ICD-10-CM | POA: Diagnosis not present

## 2023-10-11 DIAGNOSIS — D631 Anemia in chronic kidney disease: Secondary | ICD-10-CM | POA: Diagnosis not present

## 2023-10-11 DIAGNOSIS — N2581 Secondary hyperparathyroidism of renal origin: Secondary | ICD-10-CM | POA: Diagnosis not present

## 2023-10-11 DIAGNOSIS — D689 Coagulation defect, unspecified: Secondary | ICD-10-CM | POA: Diagnosis not present

## 2023-10-11 DIAGNOSIS — Z992 Dependence on renal dialysis: Secondary | ICD-10-CM | POA: Diagnosis not present

## 2023-10-13 DIAGNOSIS — D509 Iron deficiency anemia, unspecified: Secondary | ICD-10-CM | POA: Diagnosis not present

## 2023-10-13 DIAGNOSIS — E1129 Type 2 diabetes mellitus with other diabetic kidney complication: Secondary | ICD-10-CM | POA: Diagnosis not present

## 2023-10-13 DIAGNOSIS — Z992 Dependence on renal dialysis: Secondary | ICD-10-CM | POA: Diagnosis not present

## 2023-10-13 DIAGNOSIS — N2581 Secondary hyperparathyroidism of renal origin: Secondary | ICD-10-CM | POA: Diagnosis not present

## 2023-10-13 DIAGNOSIS — N186 End stage renal disease: Secondary | ICD-10-CM | POA: Diagnosis not present

## 2023-10-13 DIAGNOSIS — D689 Coagulation defect, unspecified: Secondary | ICD-10-CM | POA: Diagnosis not present

## 2023-10-13 DIAGNOSIS — D631 Anemia in chronic kidney disease: Secondary | ICD-10-CM | POA: Diagnosis not present

## 2023-10-14 ENCOUNTER — Ambulatory Visit: Payer: Medicare Other

## 2023-10-14 VITALS — Ht 72.0 in | Wt 157.0 lb

## 2023-10-14 DIAGNOSIS — Z0000 Encounter for general adult medical examination without abnormal findings: Secondary | ICD-10-CM | POA: Diagnosis not present

## 2023-10-14 DIAGNOSIS — Z1211 Encounter for screening for malignant neoplasm of colon: Secondary | ICD-10-CM | POA: Diagnosis not present

## 2023-10-14 DIAGNOSIS — Z122 Encounter for screening for malignant neoplasm of respiratory organs: Secondary | ICD-10-CM | POA: Diagnosis not present

## 2023-10-14 NOTE — Progress Notes (Addendum)
 Subjective:   Christopher Lopez is a 63 y.o. who presents for a Medicare Wellness preventive visit.  Visit Complete: Virtual I connected with  [name] on 10/14/23 by a audio enabled telemedicine application and verified that I am speaking with the correct person using two identifiers.  Patient Location: Home  Provider Location: Office/Clinic  I discussed the limitations of evaluation and management by telemedicine. The patient expressed understanding and agreed to proceed.  Vital Signs: Because this visit was a virtual/telehealth visit, some criteria may be missing or patient reported. Any vitals not documented were not able to be obtained and vitals that have been documented are patient reported.  VideoDeclined- This patient declined Librarian, academic. Therefore the visit was completed with audio only.  Persons Participating in Visit: Patient assisted by Mother, [name].  AWV Questionnaire: No: Patient Medicare AWV questionnaire was not completed prior to this visit.  Cardiac Risk Factors include: advanced age (>45men, >36 women);diabetes mellitus;dyslipidemia;hypertension;male gender     Objective:    Today's Vitals   10/14/23 1019  Weight: 157 lb (71.2 kg)  Height: 6' (1.829 m)   Body mass index is 21.29 kg/m.     10/14/2023   10:17 AM 02/04/2023    9:52 AM 12/13/2022   12:05 PM 10/14/2022    1:09 PM 10/12/2021   10:49 AM 07/26/2018    8:42 AM 03/24/2018    1:31 PM  Advanced Directives  Does Patient Have a Medical Advance Directive? No Yes Yes Yes No Yes Yes  Type of Forensic scientist Power of [employer] Power of [place];Living will Healthcare Power of Attorney  Does patient want to make changes to medical advance directive?  No - Patient declined No - Patient declined      Copy of Healthcare Power of Attorney in Chart?  No - copy requested No - copy  requested No - copy requested  No - copy requested Yes  Would patient like information on creating a medical advance directive? No - Patient declined    No - Patient declined No - Patient declined     Current Medications (verified) Outpatient Encounter Medications as of 10/14/2023  Medication Sig   acetaminophen (TYLENOL) 650 MG CR tablet Take 1,300 mg by mouth every 8 (eight) hours as needed for pain.   amLODipine (NORVASC) 2.5 MG tablet Take 1 tablet (2.5 mg total) by mouth daily.   aspirin 81 MG EC tablet Take 81 mg by mouth daily.   BYDUREON BCISE 2 MG/0.85ML AUIJ INJECT 0.85 MLS INTO THE SKIN ONCE A WEEK   diphenhydrAMINE (BENADRYL) 25 mg capsule Take 75 mg by mouth daily as needed for itching.   glucose blood (ONE TOUCH ULTRA TEST) test strip Use as instructed once daily E11.9   Lancets (ONETOUCH ULTRASOFT) lancets 1 each by Other route 2 (two) times daily. Use to check blood sugars once a day Dx e11.9   losartan (COZAAR) 50 MG tablet Take 50 mg by mouth daily.   metoprolol succinate (TOPROL-XL) 50 MG 24 hr tablet TAKE 1 TABLET BY MOUTH EVERY DAY  WITH OR IMMEDIATELY FOLLOWING A MEAL   rosuvastatin (CRESTOR) 20 MG tablet TAKE 1 TABLET BY MOUTH EVERY DAY   VELPHORO 500 MG chewable tablet Chew 1,000 mg by mouth 2 (two) times daily.   Facility-Administered Encounter Medications as of 10/14/2023  Medication   0.9 %  sodium chloride infusion   sodium  chloride flush (NS) 0.9 % injection 3 mL    Allergies (verified) Lipitor [atorvastatin]   History: Past Medical History:  Diagnosis Date   Alcohol abuse 09/18/2011   Allergic rhinitis, cause unspecified 09/20/2011   Anemia, unspecified 09/18/2011   Arthritis    Childhood asthma 09/20/2011   CKD (chronic kidney disease) stage 5, GFR less than 15 ml/min (HCC)    Dialysis - T/Th/Sa   Colitis 09/18/2011   Dementia (HCC) 09/18/2011   Diabetes mellitus    Diverticulosis of colon without hemorrhage 02/27/2014   Foot ulcer (HCC)  09/18/2011   High cholesterol    HTN (hypertension) 09/18/2011   Hyperlipidemia 11/08/2011   Hypertension    Impaired glucose tolerance 09/18/2011   Pneumonia 09/18/2011   Pre-ulcerative corn or callous 11/20/2011   Stroke (HCC)    left leg deficit   Type II or unspecified type diabetes mellitus without mention of complication, uncontrolled 09/20/2011   Wears glasses    Past Surgical History:  Procedure Laterality Date   A/V FISTULAGRAM Left 12/13/2022   Procedure: A/V Fistulagram;  Surgeon: Maeola Harman, MD;  Location: [facility] INVASIVE CV LAB;  Service: Cardiovascular;  Laterality: Left;   AV FISTULA PLACEMENT Left 12/23/2017   Procedure: CREATION OF BASILIAC - CEPHALIC  ARTERIOVENOUS (AV) FISTULA  STAGE ONE;  Surgeon: Maeola Harman, MD;  Location: [facility] OR;  Service: Vascular;  Laterality: Left;   AV FISTULA PLACEMENT Left 07/26/2018   Procedure: INSERTION OF ARTERIOVENOUS (AV) GORE-TEX GRAFT ARM;  Surgeon: Maeola Harman, MD;  Location: [facility] OR;  Service: Vascular;  Laterality: Left;   BASCILIC VEIN TRANSPOSITION Left 03/01/2018   Procedure: BASILIC VEIN TRANSPOSITION SECOND STAGE;  Surgeon: Maeola Harman, MD;  Location: [facility] OR;  Service: Vascular;  Laterality: Left;   INSERTION OF DIALYSIS CATHETER Right 12/23/2017   Procedure: INSERTION OF PALINDROME  DIALYSIS CATHETER;  Surgeon: Maeola Harman, MD;  Location: [facility] OR;  Service: Vascular;  Laterality: Right;   INSERTION OF DIALYSIS CATHETER Left 02/04/2023   Procedure: INSERTION OF TUNNELED DIALYSIS CATHETER;  Surgeon: Maeola Harman, MD;  Location: [facility] OR;  Service: Vascular;  Laterality: Left;   MASS EXCISION  02/04/2023   Procedure: EXCISION PSEUDOANEURYSM LEFT ARM;  Surgeon: Maeola Harman, MD;  Location: [facility] OR;  Service: Vascular;;   PERIPHERAL VASCULAR BALLOON ANGIOPLASTY  12/13/2022   Procedure: PERIPHERAL VASCULAR BALLOON ANGIOPLASTY;  Surgeon: Maeola Harman, MD;  Location: [facility] INVASIVE CV LAB;  Service: Cardiovascular;;   REVISION OF ARTERIOVENOUS GORETEX GRAFT Left 02/04/2023   Procedure: REVISION OF LEFT ARM ARTERIOVENOUS GORETEX GRAFT USING 7mm GORETEX STRETCH GRAFT;  Surgeon: Maeola Harman, MD;  Location: [facility] OR;  Service: Vascular;  Laterality: Left;   THROMBECTOMY BRACHIAL ARTERY Left 07/26/2018   Procedure: THROMBECTOMY LEFT UPPER EXTREMITY;  Surgeon: Maeola Harman, MD;  Location: [facility] OR;  Service: Vascular;  Laterality: Left;   TONSILLECTOMY  1985   VEIN REPAIR Left 07/26/2018   Procedure: STENT LEFT AXILLARY VEIN;  Surgeon: Maeola Harman, MD;  Location: [facility] OR;  Service: Vascular;  Laterality: Left;   Family History  Problem Relation Age of Onset   Prostate cancer Paternal Grandfather    Diabetes Mother    Heart disease Mother    Transient ischemic attack Mother    Diabetes Sister    Diabetes Maternal Grandfather    Colon cancer Neg Hx    Social History   Socioeconomic History   Marital status: Single    Spouse  name: Not on file   Number of children: Not on file   Years of education: Not on file   Highest education level: Not on file  Occupational History   Not on file  Tobacco Use   Smoking status: Every Day    Current packs/day: 0.25    Average packs/day: 0.3 packs/day for 52.3 years (13.1 ttl pk-yrs)    Types: Cigarettes    Start date: 07/13/1971    Passive exposure: Current   Smokeless tobacco: Never   Tobacco comments:    Pt declines to quit as of 10/14/2023.  Vaping Use   Vaping status: Never Used  Substance and Sexual Activity   Alcohol use: Not Currently    Alcohol/week: 2.0 standard drinks of alcohol    Types: 2 Cans of beer per week    Comment: quit years ago   Drug use: No   Sexual activity: Not on file  Other Topics Concern   Not on file  Social History Narrative   Single   Social Drivers of Health   Financial Resource Strain: Low Risk  (10/14/2023)   Overall  Financial Resource Strain (CARDIA)    Difficulty of Paying Living Expenses: Not hard at all  Food Insecurity: No Food Insecurity (10/14/2023)   Hunger Vital Sign    Worried About Running Out of Food in the Last Year: Never true    Ran Out of Food in the Last Year: Never true  Transportation Needs: No Transportation Needs (10/14/2023)   PRAPARE - Administrator, Civil Service (Medical): No    Lack of Transportation (Non-Medical): No  Physical Activity: Inactive (10/14/2023)   Exercise Vital Sign    Days of Exercise per Week: 0 days    Minutes of Exercise per Session: 0 min  Stress: No Stress Concern Present (10/14/2023)   [employer] of Occupational Health - Occupational Stress Questionnaire    Feeling of Stress : Not at all  Social Connections: Socially Isolated (10/14/2023)   Social Connection and Isolation Panel [NHANES]    Frequency of Communication with Friends and Family: Never    Frequency of Social Gatherings with Friends and Family: Never    Attends Religious Services: Never    Database administrator or Organizations: No    Attends Banker Meetings: Never    Marital Status: Never married    Tobacco Counseling Ready to quit: No Counseling given: No Tobacco comments: Pt declines to quit as of 10/14/2023. Referral to [facility] Pulmonology for a Lung Cancer Screening test (current smoker).   Clinical Intake:  Pre-visit preparation completed: Yes  Pain : No/denies pain     BMI - recorded: 21.29 Nutritional Status: BMI of 19-24  Normal Nutritional Risks: None Diabetes: Yes CBG done?: No Did pt. bring in CBG monitor from home?: No  Lab Results  Component Value Date   HGBA1C 5.4 05/23/2023   HGBA1C 6.4 (A) 11/29/2022   HGBA1C 6.3 (A) 04/14/2022     How often do you need to have someone help you when you read instructions, pamphlets, or other written materials from your doctor or pharmacy?: 1 - Never  Interpreter Needed?: No  Information  entered by :: Hassell Halim, CMA   Activities of Daily Living     10/14/2023   10:23 AM 02/04/2023    9:57 AM  In your present state of health, do you have any difficulty performing the following activities:  Hearing? 0   Vision? 0   Difficulty concentrating  or making decisions? 0   Walking or climbing stairs? 0   Dressing or bathing? 0   Doing errands, shopping? 0 0  Preparing Food and eating ? N   Using the Toilet? N   In the past six months, have you accidently leaked urine? N   Do you have problems with loss of bowel control? N   Managing your Medications? N   Managing your Finances? N   Housekeeping or managing your Housekeeping? N     Patient Care Team: Corwin Levins, MD as PCP - General (Internal Medicine) Ginette Otto, [facility] Adam Phenix, MD as Consulting Physician (Ophthalmology) Helane Gunther, DPM as Consulting Physician (Podiatry)  Indicate any recent Medical Services you may have received from other than [facility] providers in the past year (date may be approximate).     Assessment:   This is a routine wellness examination for Deontre.  Hearing/Vision screen Hearing Screening - Comments:: Denies hearing difficulties   Vision Screening - Comments:: Wears rx glasses - up to date with routine eye exams with Dr Sallye Lat   Goals Addressed               This Visit's Progress     Patient Stated (pt-stated)        Patient stated he plans to stay active.       Depression Screen     10/14/2023   10:27 AM 11/29/2022   11:16 AM 10/14/2022    1:12 PM 04/14/2022   10:31 AM 10/12/2021   10:52 AM 10/12/2021   10:10 AM 09/26/2020   10:49 AM  PHQ 2/9 Scores  PHQ - 2 Score 0 0 0 0 0 0 0  PHQ- 9 Score 0 0 0 2       Fall Risk     10/14/2023   10:28 AM 05/23/2023   10:54 AM 11/29/2022   11:16 AM 10/14/2022    1:10 PM 04/14/2022   10:31 AM  Fall Risk   Falls in the past year? 0 0 0 0 0  Number falls in past yr: 0 0 0 0   Injury with Fall?  0 0 0 0   Risk for fall due to : No Fall Risks No Fall Risks No Fall Risks No Fall Risks No Fall Risks  Follow up Falls prevention discussed;Falls evaluation completed Falls evaluation completed Falls evaluation completed Falls prevention discussed Falls evaluation completed    MEDICARE RISK AT HOME:  Medicare Risk at Home Any stairs in or around the home?: No If so, are there any without handrails?: No Home free of loose throw rugs in walkways, pet beds, electrical cords, etc?: Yes Adequate lighting in your home to reduce risk of falls?: Yes Life alert?: No Use of a cane, walker or w/c?: No Grab bars in the bathroom?: Yes Shower chair or bench in shower?: Yes Elevated toilet seat or a handicapped toilet?: Yes  TIMED UP AND GO:  Was the test performed?  No  Cognitive Function: 6CIT completed    10/14/2022    1:16 PM  MMSE - Mini Mental State Exam  Not completed: Unable to complete        10/14/2023   10:28 AM  6CIT Screen  What Year? 0 points  What month? 0 points  What time? 0 points  Count back from 20 0 points  Months in reverse 2 points  Repeat phrase 0 points  Total Score 2 points    Immunizations Immunization  History  Administered Date(s) Administered   Fluad Trivalent(High Dose 65+) 05/23/2023   Hepatitis B, ADULT 01/10/2018, 02/11/2018, 03/14/2018, 07/10/2018   Hepb-cpg 05/29/2022   Influenza,inj,Quad PF,6+ Mos 02/27/2014, 08/22/2015, 09/06/2018, 03/09/2019, 05/06/2020, 04/01/2021, 04/21/2021, 04/14/2022   Moderna Sars-Covid-2 Vaccination 09/20/2019, 10/25/2019   Pneumococcal Conjugate-13 09/06/2018, 10/31/2018   Pneumococcal Polysaccharide-23 08/20/2014, 08/22/2015   Tdap 09/05/2012    Screening Tests Health Maintenance  Topic Date Due   Zoster Vaccines- Shingrix (1 of 2) Never done   DTaP/Tdap/Td (2 - Td or Tdap) 09/05/2022   Colonoscopy  10/12/2023   HEMOGLOBIN A1C  11/20/2023   INFLUENZA VACCINE  02/10/2024   OPHTHALMOLOGY EXAM  04/03/2024    FOOT EXAM  05/22/2024   Pneumococcal Vaccine 18-75 Years old (4 of 4 - PPSV23 or PCV20) 09/09/2024   Medicare Annual Wellness (AWV)  10/13/2024   Hepatitis C Screening  Completed   HIV Screening  Completed   HPV VACCINES  Aged Out   COVID-19 Vaccine  Discontinued    Health Maintenance  Health Maintenance Due  Topic Date Due   Zoster Vaccines- Shingrix (1 of 2) Never done   DTaP/Tdap/Td (2 - Td or Tdap) 09/05/2022   Colonoscopy  10/12/2023   Health Maintenance Items Addressed: 10/14/2023 Referral sent to GI for colonoscopy Referral sent to [facility] Pulmonology for a Lung Cancer Screening test.  Additional Screening:  Vision Screening: Recommended annual ophthalmology exams for early detection of glaucoma and other disorders of the eye.  Dental Screening: Recommended annual dental exams for proper oral hygiene  Community Resource Referral / Chronic Care Management: CRR required this visit?  No   CCM required this visit?  No     Plan:     I have personally reviewed and noted the following in the patient's chart:   Medical and social history Use of alcohol, tobacco or illicit drugs  Current medications and supplements including opioid prescriptions. Patient is not currently taking opioid prescriptions. Functional ability and status Nutritional status Physical activity Advanced directives List of other physicians Hospitalizations, surgeries, and ER visits in previous 12 months Vitals Screenings to include cognitive, depression, and falls Referrals and appointments  In addition, I have reviewed and discussed with patient certain preventive protocols, quality metrics, and best practice recommendations. A written personalized care plan for preventive services as well as general preventive health recommendations were provided to patient.     Darreld Mclean, CMA   10/14/2023   After Visit Summary: (MyChart) Due to this being a telephonic visit, the after visit summary with  patients personalized plan was offered to patient via MyChart   Notes: Nothing significant to report at this time.

## 2023-10-14 NOTE — Addendum Note (Signed)
 Addended by: Darreld Mclean on: 10/14/2023 11:31 AM   Modules accepted: Orders

## 2023-10-14 NOTE — Patient Instructions (Addendum)
 Mr. Christopher Lopez , Thank you for taking time to come for your Medicare Wellness Visit. I appreciate your ongoing commitment to your health goals. Please review the following plan we discussed and let me know if I can assist you in the future.   Referrals/Orders/Follow-Ups/Clinician Recommendations: Aim for 30 minutes of exercise or brisk walking, 6-8 glasses of water, and 5 servings of fruits and vegetables each day.   This is a list of the screening recommended for you and due dates:  Health Maintenance  Topic Date Due   Zoster (Shingles) Vaccine (1 of 2) Never done   DTaP/Tdap/Td vaccine (2 - Td or Tdap) 09/05/2022   Colon Cancer Screening  10/12/2023   Hemoglobin A1C  11/20/2023   Flu Shot  02/10/2024   Eye exam for diabetics  04/03/2024   Complete foot exam   05/22/2024   Pneumococcal Vaccination (4 of 4 - PPSV23 or PCV20) 09/09/2024   Medicare Annual Wellness Visit  10/13/2024   Hepatitis C Screening  Completed   HIV Screening  Completed   HPV Vaccine  Aged Out   COVID-19 Vaccine  Discontinued    Advanced directives: (Declined) Advance directive discussed with you today. Even though you declined this today, please call our office should you change your mind, and we can give you the proper paperwork for you to fill out.  Next Medicare Annual Wellness Visit scheduled for next year: Yes

## 2023-10-15 DIAGNOSIS — E1129 Type 2 diabetes mellitus with other diabetic kidney complication: Secondary | ICD-10-CM | POA: Diagnosis not present

## 2023-10-15 DIAGNOSIS — D689 Coagulation defect, unspecified: Secondary | ICD-10-CM | POA: Diagnosis not present

## 2023-10-15 DIAGNOSIS — Z992 Dependence on renal dialysis: Secondary | ICD-10-CM | POA: Diagnosis not present

## 2023-10-15 DIAGNOSIS — D631 Anemia in chronic kidney disease: Secondary | ICD-10-CM | POA: Diagnosis not present

## 2023-10-15 DIAGNOSIS — N186 End stage renal disease: Secondary | ICD-10-CM | POA: Diagnosis not present

## 2023-10-15 DIAGNOSIS — N2581 Secondary hyperparathyroidism of renal origin: Secondary | ICD-10-CM | POA: Diagnosis not present

## 2023-10-15 DIAGNOSIS — D509 Iron deficiency anemia, unspecified: Secondary | ICD-10-CM | POA: Diagnosis not present

## 2023-10-18 DIAGNOSIS — D631 Anemia in chronic kidney disease: Secondary | ICD-10-CM | POA: Diagnosis not present

## 2023-10-18 DIAGNOSIS — Z992 Dependence on renal dialysis: Secondary | ICD-10-CM | POA: Diagnosis not present

## 2023-10-18 DIAGNOSIS — N186 End stage renal disease: Secondary | ICD-10-CM | POA: Diagnosis not present

## 2023-10-18 DIAGNOSIS — D689 Coagulation defect, unspecified: Secondary | ICD-10-CM | POA: Diagnosis not present

## 2023-10-18 DIAGNOSIS — N2581 Secondary hyperparathyroidism of renal origin: Secondary | ICD-10-CM | POA: Diagnosis not present

## 2023-10-18 DIAGNOSIS — D509 Iron deficiency anemia, unspecified: Secondary | ICD-10-CM | POA: Diagnosis not present

## 2023-10-18 DIAGNOSIS — E1129 Type 2 diabetes mellitus with other diabetic kidney complication: Secondary | ICD-10-CM | POA: Diagnosis not present

## 2023-10-20 DIAGNOSIS — N186 End stage renal disease: Secondary | ICD-10-CM | POA: Diagnosis not present

## 2023-10-20 DIAGNOSIS — N2581 Secondary hyperparathyroidism of renal origin: Secondary | ICD-10-CM | POA: Diagnosis not present

## 2023-10-20 DIAGNOSIS — D631 Anemia in chronic kidney disease: Secondary | ICD-10-CM | POA: Diagnosis not present

## 2023-10-20 DIAGNOSIS — E1129 Type 2 diabetes mellitus with other diabetic kidney complication: Secondary | ICD-10-CM | POA: Diagnosis not present

## 2023-10-20 DIAGNOSIS — Z992 Dependence on renal dialysis: Secondary | ICD-10-CM | POA: Diagnosis not present

## 2023-10-20 DIAGNOSIS — D689 Coagulation defect, unspecified: Secondary | ICD-10-CM | POA: Diagnosis not present

## 2023-10-20 DIAGNOSIS — D509 Iron deficiency anemia, unspecified: Secondary | ICD-10-CM | POA: Diagnosis not present

## 2023-10-22 DIAGNOSIS — D631 Anemia in chronic kidney disease: Secondary | ICD-10-CM | POA: Diagnosis not present

## 2023-10-22 DIAGNOSIS — Z992 Dependence on renal dialysis: Secondary | ICD-10-CM | POA: Diagnosis not present

## 2023-10-22 DIAGNOSIS — E1129 Type 2 diabetes mellitus with other diabetic kidney complication: Secondary | ICD-10-CM | POA: Diagnosis not present

## 2023-10-22 DIAGNOSIS — D689 Coagulation defect, unspecified: Secondary | ICD-10-CM | POA: Diagnosis not present

## 2023-10-22 DIAGNOSIS — N186 End stage renal disease: Secondary | ICD-10-CM | POA: Diagnosis not present

## 2023-10-22 DIAGNOSIS — D509 Iron deficiency anemia, unspecified: Secondary | ICD-10-CM | POA: Diagnosis not present

## 2023-10-22 DIAGNOSIS — N2581 Secondary hyperparathyroidism of renal origin: Secondary | ICD-10-CM | POA: Diagnosis not present

## 2023-10-24 ENCOUNTER — Other Ambulatory Visit: Payer: Self-pay

## 2023-10-24 ENCOUNTER — Other Ambulatory Visit: Payer: Self-pay | Admitting: Internal Medicine

## 2023-10-25 DIAGNOSIS — E1129 Type 2 diabetes mellitus with other diabetic kidney complication: Secondary | ICD-10-CM | POA: Diagnosis not present

## 2023-10-25 DIAGNOSIS — D689 Coagulation defect, unspecified: Secondary | ICD-10-CM | POA: Diagnosis not present

## 2023-10-25 DIAGNOSIS — D631 Anemia in chronic kidney disease: Secondary | ICD-10-CM | POA: Diagnosis not present

## 2023-10-25 DIAGNOSIS — N2581 Secondary hyperparathyroidism of renal origin: Secondary | ICD-10-CM | POA: Diagnosis not present

## 2023-10-25 DIAGNOSIS — N186 End stage renal disease: Secondary | ICD-10-CM | POA: Diagnosis not present

## 2023-10-25 DIAGNOSIS — Z992 Dependence on renal dialysis: Secondary | ICD-10-CM | POA: Diagnosis not present

## 2023-10-25 DIAGNOSIS — D509 Iron deficiency anemia, unspecified: Secondary | ICD-10-CM | POA: Diagnosis not present

## 2023-10-26 ENCOUNTER — Ambulatory Visit: Payer: Medicare Other | Admitting: Podiatry

## 2023-10-26 ENCOUNTER — Telehealth: Payer: Self-pay | Admitting: Internal Medicine

## 2023-10-26 NOTE — Telephone Encounter (Signed)
 Copied from CRM [id number]. Topic: Clinical - Prescription Issue >> Oct 26, 2023  2:48 PM Melissa C wrote: Reason for CRM: Patient's mother is calling as patient's BYDUREON BCISE 2 MG/0.85ML AUIJ used to be $0 now it is over $390 which they cannot afford. She is calling for assistance with medication or to see if an alternative can be called in for him. Please advise with patient's mother Christopher Lopez.

## 2023-10-27 DIAGNOSIS — N2581 Secondary hyperparathyroidism of renal origin: Secondary | ICD-10-CM | POA: Diagnosis not present

## 2023-10-27 DIAGNOSIS — N186 End stage renal disease: Secondary | ICD-10-CM | POA: Diagnosis not present

## 2023-10-27 DIAGNOSIS — D689 Coagulation defect, unspecified: Secondary | ICD-10-CM | POA: Diagnosis not present

## 2023-10-27 DIAGNOSIS — E1129 Type 2 diabetes mellitus with other diabetic kidney complication: Secondary | ICD-10-CM | POA: Diagnosis not present

## 2023-10-27 DIAGNOSIS — D509 Iron deficiency anemia, unspecified: Secondary | ICD-10-CM | POA: Diagnosis not present

## 2023-10-27 DIAGNOSIS — Z992 Dependence on renal dialysis: Secondary | ICD-10-CM | POA: Diagnosis not present

## 2023-10-27 DIAGNOSIS — D631 Anemia in chronic kidney disease: Secondary | ICD-10-CM | POA: Diagnosis not present

## 2023-10-27 MED ORDER — TRULICITY 1.5 MG/0.5ML ~~LOC~~ SOAJ: 1.5000 mg | SUBCUTANEOUS | 3 refills | Status: DC

## 2023-10-27 NOTE — Addendum Note (Signed)
 Addended by: Roslyn Coombe on: 10/27/2023 12:08 PM   Modules accepted: Orders

## 2023-10-27 NOTE — Telephone Encounter (Signed)
 Ok to try change bydureon to trulicity 1.5 mg - once weekly

## 2023-10-28 ENCOUNTER — Telehealth: Payer: Self-pay | Admitting: Internal Medicine

## 2023-10-28 NOTE — Telephone Encounter (Signed)
  Chief Complaint: Medication Question  Additional Notes: Spoke with patients mother regarding medication change. Advised Trulicity  was ordered as replacement yesterday 10/27/23. Patients mother advised patient has taken this in the past so they are familiar with how it works. Advised to call back if any further questions on use once medication arrives. Patients mother verbalized understanding.     Copied from CRM [id number]. Topic: Clinical - Prescription Issue >> Oct 26, 2023  2:48 PM Melissa C wrote: Reason for CRM: Patient's mother is calling as patient's BYDUREON  BCISE 2 MG/0.85ML AUIJ used to be $0 now it is over $390 which they cannot afford. She is calling for assistance with medication or to see if an alternative can be called in for him. Please advise with patient's mother Orlean. >> Oct 28, 2023  9:05 AM Eliezer H wrote: Patient's mother called this morning regarding a CRM that was created on Wednesday concerning her son's Bydureon  BCise 2 mg/0.85 mL prescription. She stated that last year the medication cost $0, but it is now over $300, which they are unable to afford. She is requesting if the provider can prescribe an alternative medication, as they have not received any follow-up or response to the previous message left on Wednesday  Mother Callback Number: [phone]

## 2023-10-29 DIAGNOSIS — D509 Iron deficiency anemia, unspecified: Secondary | ICD-10-CM | POA: Diagnosis not present

## 2023-10-29 DIAGNOSIS — D689 Coagulation defect, unspecified: Secondary | ICD-10-CM | POA: Diagnosis not present

## 2023-10-29 DIAGNOSIS — N2581 Secondary hyperparathyroidism of renal origin: Secondary | ICD-10-CM | POA: Diagnosis not present

## 2023-10-29 DIAGNOSIS — Z992 Dependence on renal dialysis: Secondary | ICD-10-CM | POA: Diagnosis not present

## 2023-10-29 DIAGNOSIS — N186 End stage renal disease: Secondary | ICD-10-CM | POA: Diagnosis not present

## 2023-10-29 DIAGNOSIS — E1129 Type 2 diabetes mellitus with other diabetic kidney complication: Secondary | ICD-10-CM | POA: Diagnosis not present

## 2023-10-29 DIAGNOSIS — D631 Anemia in chronic kidney disease: Secondary | ICD-10-CM | POA: Diagnosis not present

## 2023-11-01 DIAGNOSIS — D509 Iron deficiency anemia, unspecified: Secondary | ICD-10-CM | POA: Diagnosis not present

## 2023-11-01 DIAGNOSIS — N186 End stage renal disease: Secondary | ICD-10-CM | POA: Diagnosis not present

## 2023-11-01 DIAGNOSIS — D631 Anemia in chronic kidney disease: Secondary | ICD-10-CM | POA: Diagnosis not present

## 2023-11-01 DIAGNOSIS — Z992 Dependence on renal dialysis: Secondary | ICD-10-CM | POA: Diagnosis not present

## 2023-11-01 DIAGNOSIS — E1129 Type 2 diabetes mellitus with other diabetic kidney complication: Secondary | ICD-10-CM | POA: Diagnosis not present

## 2023-11-01 DIAGNOSIS — N2581 Secondary hyperparathyroidism of renal origin: Secondary | ICD-10-CM | POA: Diagnosis not present

## 2023-11-01 DIAGNOSIS — D689 Coagulation defect, unspecified: Secondary | ICD-10-CM | POA: Diagnosis not present

## 2023-11-01 NOTE — Telephone Encounter (Signed)
 Pt's Mother, Monroe [place], called back & stated that per [facility], they haven't received a prescription for Trulicity . She requested for Dr. Autry Legions to send it to [facility] on [address]. Please advise.

## 2023-11-02 ENCOUNTER — Other Ambulatory Visit: Payer: Self-pay | Admitting: Internal Medicine

## 2023-11-02 ENCOUNTER — Other Ambulatory Visit: Payer: Self-pay

## 2023-11-02 ENCOUNTER — Telehealth: Payer: Self-pay | Admitting: Internal Medicine

## 2023-11-02 MED ORDER — TRULICITY 1.5 MG/0.5ML ~~LOC~~ SOAJ: 1.5000 mg | SUBCUTANEOUS | 3 refills | Status: DC

## 2023-11-02 NOTE — Telephone Encounter (Signed)
 Copied from CRM [id number]. Topic: Clinical - Prescription Issue >> Nov 02, 2023  9:15 AM Orien Bird wrote: Reason for CRM: Patient mother stated the Dulaglutide  (TRULICITY ) 1.5 MG/0.5ML SOAJ medication needs to go to [facility] because [facility] stated they can't deliver it.    [facility] - [place], [place] - [address] [place] [address] Phone: [phone] Fax: [phone]

## 2023-11-02 NOTE — Telephone Encounter (Signed)
Script sent in for patient. 

## 2023-11-02 NOTE — Telephone Encounter (Signed)
 It has been sent to [facility] on [address].

## 2023-11-02 NOTE — Telephone Encounter (Signed)
 Ok to let pt know -   [facility] says they are unable to stock the trulicity  right now, so I sent a new script to [address].

## 2023-11-03 DIAGNOSIS — N186 End stage renal disease: Secondary | ICD-10-CM | POA: Diagnosis not present

## 2023-11-03 DIAGNOSIS — E1129 Type 2 diabetes mellitus with other diabetic kidney complication: Secondary | ICD-10-CM | POA: Diagnosis not present

## 2023-11-03 DIAGNOSIS — D689 Coagulation defect, unspecified: Secondary | ICD-10-CM | POA: Diagnosis not present

## 2023-11-03 DIAGNOSIS — D509 Iron deficiency anemia, unspecified: Secondary | ICD-10-CM | POA: Diagnosis not present

## 2023-11-03 DIAGNOSIS — D631 Anemia in chronic kidney disease: Secondary | ICD-10-CM | POA: Diagnosis not present

## 2023-11-03 DIAGNOSIS — Z992 Dependence on renal dialysis: Secondary | ICD-10-CM | POA: Diagnosis not present

## 2023-11-03 DIAGNOSIS — N2581 Secondary hyperparathyroidism of renal origin: Secondary | ICD-10-CM | POA: Diagnosis not present

## 2023-11-05 DIAGNOSIS — D631 Anemia in chronic kidney disease: Secondary | ICD-10-CM | POA: Diagnosis not present

## 2023-11-05 DIAGNOSIS — N2581 Secondary hyperparathyroidism of renal origin: Secondary | ICD-10-CM | POA: Diagnosis not present

## 2023-11-05 DIAGNOSIS — D689 Coagulation defect, unspecified: Secondary | ICD-10-CM | POA: Diagnosis not present

## 2023-11-05 DIAGNOSIS — D509 Iron deficiency anemia, unspecified: Secondary | ICD-10-CM | POA: Diagnosis not present

## 2023-11-05 DIAGNOSIS — N186 End stage renal disease: Secondary | ICD-10-CM | POA: Diagnosis not present

## 2023-11-05 DIAGNOSIS — Z992 Dependence on renal dialysis: Secondary | ICD-10-CM | POA: Diagnosis not present

## 2023-11-05 DIAGNOSIS — E1129 Type 2 diabetes mellitus with other diabetic kidney complication: Secondary | ICD-10-CM | POA: Diagnosis not present

## 2023-11-08 DIAGNOSIS — D689 Coagulation defect, unspecified: Secondary | ICD-10-CM | POA: Diagnosis not present

## 2023-11-08 DIAGNOSIS — N186 End stage renal disease: Secondary | ICD-10-CM | POA: Diagnosis not present

## 2023-11-08 DIAGNOSIS — Z992 Dependence on renal dialysis: Secondary | ICD-10-CM | POA: Diagnosis not present

## 2023-11-08 DIAGNOSIS — D509 Iron deficiency anemia, unspecified: Secondary | ICD-10-CM | POA: Diagnosis not present

## 2023-11-08 DIAGNOSIS — E1129 Type 2 diabetes mellitus with other diabetic kidney complication: Secondary | ICD-10-CM | POA: Diagnosis not present

## 2023-11-08 DIAGNOSIS — D631 Anemia in chronic kidney disease: Secondary | ICD-10-CM | POA: Diagnosis not present

## 2023-11-08 DIAGNOSIS — N2581 Secondary hyperparathyroidism of renal origin: Secondary | ICD-10-CM | POA: Diagnosis not present

## 2023-11-09 DIAGNOSIS — E1122 Type 2 diabetes mellitus with diabetic chronic kidney disease: Secondary | ICD-10-CM | POA: Diagnosis not present

## 2023-11-09 DIAGNOSIS — N186 End stage renal disease: Secondary | ICD-10-CM | POA: Diagnosis not present

## 2023-11-09 DIAGNOSIS — Z992 Dependence on renal dialysis: Secondary | ICD-10-CM | POA: Diagnosis not present

## 2023-11-10 DIAGNOSIS — D509 Iron deficiency anemia, unspecified: Secondary | ICD-10-CM | POA: Diagnosis not present

## 2023-11-10 DIAGNOSIS — E1129 Type 2 diabetes mellitus with other diabetic kidney complication: Secondary | ICD-10-CM | POA: Diagnosis not present

## 2023-11-10 DIAGNOSIS — D689 Coagulation defect, unspecified: Secondary | ICD-10-CM | POA: Diagnosis not present

## 2023-11-10 DIAGNOSIS — N2581 Secondary hyperparathyroidism of renal origin: Secondary | ICD-10-CM | POA: Diagnosis not present

## 2023-11-10 DIAGNOSIS — Z992 Dependence on renal dialysis: Secondary | ICD-10-CM | POA: Diagnosis not present

## 2023-11-10 DIAGNOSIS — N186 End stage renal disease: Secondary | ICD-10-CM | POA: Diagnosis not present

## 2023-11-10 DIAGNOSIS — D631 Anemia in chronic kidney disease: Secondary | ICD-10-CM | POA: Diagnosis not present

## 2023-11-12 DIAGNOSIS — D631 Anemia in chronic kidney disease: Secondary | ICD-10-CM | POA: Diagnosis not present

## 2023-11-12 DIAGNOSIS — Z992 Dependence on renal dialysis: Secondary | ICD-10-CM | POA: Diagnosis not present

## 2023-11-12 DIAGNOSIS — D509 Iron deficiency anemia, unspecified: Secondary | ICD-10-CM | POA: Diagnosis not present

## 2023-11-12 DIAGNOSIS — N2581 Secondary hyperparathyroidism of renal origin: Secondary | ICD-10-CM | POA: Diagnosis not present

## 2023-11-12 DIAGNOSIS — E1129 Type 2 diabetes mellitus with other diabetic kidney complication: Secondary | ICD-10-CM | POA: Diagnosis not present

## 2023-11-12 DIAGNOSIS — D689 Coagulation defect, unspecified: Secondary | ICD-10-CM | POA: Diagnosis not present

## 2023-11-12 DIAGNOSIS — N186 End stage renal disease: Secondary | ICD-10-CM | POA: Diagnosis not present

## 2023-11-15 DIAGNOSIS — E1129 Type 2 diabetes mellitus with other diabetic kidney complication: Secondary | ICD-10-CM | POA: Diagnosis not present

## 2023-11-15 DIAGNOSIS — D509 Iron deficiency anemia, unspecified: Secondary | ICD-10-CM | POA: Diagnosis not present

## 2023-11-15 DIAGNOSIS — D631 Anemia in chronic kidney disease: Secondary | ICD-10-CM | POA: Diagnosis not present

## 2023-11-15 DIAGNOSIS — D689 Coagulation defect, unspecified: Secondary | ICD-10-CM | POA: Diagnosis not present

## 2023-11-15 DIAGNOSIS — N186 End stage renal disease: Secondary | ICD-10-CM | POA: Diagnosis not present

## 2023-11-15 DIAGNOSIS — Z992 Dependence on renal dialysis: Secondary | ICD-10-CM | POA: Diagnosis not present

## 2023-11-15 DIAGNOSIS — N2581 Secondary hyperparathyroidism of renal origin: Secondary | ICD-10-CM | POA: Diagnosis not present

## 2023-11-17 DIAGNOSIS — D631 Anemia in chronic kidney disease: Secondary | ICD-10-CM | POA: Diagnosis not present

## 2023-11-17 DIAGNOSIS — Z992 Dependence on renal dialysis: Secondary | ICD-10-CM | POA: Diagnosis not present

## 2023-11-17 DIAGNOSIS — N2581 Secondary hyperparathyroidism of renal origin: Secondary | ICD-10-CM | POA: Diagnosis not present

## 2023-11-17 DIAGNOSIS — D509 Iron deficiency anemia, unspecified: Secondary | ICD-10-CM | POA: Diagnosis not present

## 2023-11-17 DIAGNOSIS — E1129 Type 2 diabetes mellitus with other diabetic kidney complication: Secondary | ICD-10-CM | POA: Diagnosis not present

## 2023-11-17 DIAGNOSIS — N186 End stage renal disease: Secondary | ICD-10-CM | POA: Diagnosis not present

## 2023-11-17 DIAGNOSIS — D689 Coagulation defect, unspecified: Secondary | ICD-10-CM | POA: Diagnosis not present

## 2023-11-19 DIAGNOSIS — D689 Coagulation defect, unspecified: Secondary | ICD-10-CM | POA: Diagnosis not present

## 2023-11-19 DIAGNOSIS — D509 Iron deficiency anemia, unspecified: Secondary | ICD-10-CM | POA: Diagnosis not present

## 2023-11-19 DIAGNOSIS — N2581 Secondary hyperparathyroidism of renal origin: Secondary | ICD-10-CM | POA: Diagnosis not present

## 2023-11-19 DIAGNOSIS — N186 End stage renal disease: Secondary | ICD-10-CM | POA: Diagnosis not present

## 2023-11-19 DIAGNOSIS — E1129 Type 2 diabetes mellitus with other diabetic kidney complication: Secondary | ICD-10-CM | POA: Diagnosis not present

## 2023-11-19 DIAGNOSIS — D631 Anemia in chronic kidney disease: Secondary | ICD-10-CM | POA: Diagnosis not present

## 2023-11-19 DIAGNOSIS — Z992 Dependence on renal dialysis: Secondary | ICD-10-CM | POA: Diagnosis not present

## 2023-11-21 ENCOUNTER — Ambulatory Visit: Payer: Medicare Other | Admitting: Internal Medicine

## 2023-11-21 ENCOUNTER — Encounter: Payer: Self-pay | Admitting: Podiatry

## 2023-11-21 ENCOUNTER — Ambulatory Visit (INDEPENDENT_AMBULATORY_CARE_PROVIDER_SITE_OTHER): Admitting: Podiatry

## 2023-11-21 DIAGNOSIS — B351 Tinea unguium: Secondary | ICD-10-CM | POA: Diagnosis not present

## 2023-11-21 DIAGNOSIS — E1142 Type 2 diabetes mellitus with diabetic polyneuropathy: Secondary | ICD-10-CM

## 2023-11-21 DIAGNOSIS — Q828 Other specified congenital malformations of skin: Secondary | ICD-10-CM | POA: Diagnosis not present

## 2023-11-21 DIAGNOSIS — M79676 Pain in unspecified toe(s): Secondary | ICD-10-CM | POA: Diagnosis not present

## 2023-11-21 NOTE — Progress Notes (Signed)
This patient returns to my office for at risk foot care.  This patient requires this care by a professional since this patient will be at risk due to having  ESRD, CKD and diabetes.  This patient is unable to cut nails himself since the patient cannot reach his nails.These nails are painful walking and wearing shoes.  This patient presents for at risk foot care today.  General Appearance  Alert, conversant and in no acute stress.  Vascular  Dorsalis pedis and posterior tibial  pulses are palpable  bilaterally.  Capillary return is within normal limits  bilaterally. Temperature is within normal limits  bilaterally.  Neurologic  Senn-Weinstein monofilament wire test diminished   bilaterally. Muscle power within normal limits bilaterally.  Nails Thick disfigured discolored nails with subungual debris  from hallux to fifth toes bilaterally. No evidence of bacterial infection or drainage bilaterally.  Orthopedic  No limitations of motion  feet .  No crepitus or effusions noted.  No bony pathology or digital deformities noted.  Skin  normotropic skin  noted bilaterally.  No signs of infections or ulcers noted.   Porokeratosis sub 4  B/L.   symptomatic.  Onychomycosis  Pain in right toes  Pain in left toes  Porokeratosis sub 4 left foot.  Consent was obtained for treatment procedures.   Mechanical debridement of nails 1-5  bilaterally performed with a nail nipper.  Filed with dremel without incident.  Debride porokeratosis with # 15 blade and dremel tool.   Return office visit   9 weeks                  Told patient to return for periodic foot care and evaluation due to potential at risk complications.   Gardiner Barefoot DPM

## 2023-11-22 DIAGNOSIS — D631 Anemia in chronic kidney disease: Secondary | ICD-10-CM | POA: Diagnosis not present

## 2023-11-22 DIAGNOSIS — N2581 Secondary hyperparathyroidism of renal origin: Secondary | ICD-10-CM | POA: Diagnosis not present

## 2023-11-22 DIAGNOSIS — D689 Coagulation defect, unspecified: Secondary | ICD-10-CM | POA: Diagnosis not present

## 2023-11-22 DIAGNOSIS — Z992 Dependence on renal dialysis: Secondary | ICD-10-CM | POA: Diagnosis not present

## 2023-11-22 DIAGNOSIS — N186 End stage renal disease: Secondary | ICD-10-CM | POA: Diagnosis not present

## 2023-11-22 DIAGNOSIS — D509 Iron deficiency anemia, unspecified: Secondary | ICD-10-CM | POA: Diagnosis not present

## 2023-11-22 DIAGNOSIS — E1129 Type 2 diabetes mellitus with other diabetic kidney complication: Secondary | ICD-10-CM | POA: Diagnosis not present

## 2023-11-24 DIAGNOSIS — N186 End stage renal disease: Secondary | ICD-10-CM | POA: Diagnosis not present

## 2023-11-24 DIAGNOSIS — D631 Anemia in chronic kidney disease: Secondary | ICD-10-CM | POA: Diagnosis not present

## 2023-11-24 DIAGNOSIS — Z992 Dependence on renal dialysis: Secondary | ICD-10-CM | POA: Diagnosis not present

## 2023-11-24 DIAGNOSIS — N2581 Secondary hyperparathyroidism of renal origin: Secondary | ICD-10-CM | POA: Diagnosis not present

## 2023-11-24 DIAGNOSIS — D689 Coagulation defect, unspecified: Secondary | ICD-10-CM | POA: Diagnosis not present

## 2023-11-24 DIAGNOSIS — D509 Iron deficiency anemia, unspecified: Secondary | ICD-10-CM | POA: Diagnosis not present

## 2023-11-24 DIAGNOSIS — E1129 Type 2 diabetes mellitus with other diabetic kidney complication: Secondary | ICD-10-CM | POA: Diagnosis not present

## 2023-11-26 DIAGNOSIS — D689 Coagulation defect, unspecified: Secondary | ICD-10-CM | POA: Diagnosis not present

## 2023-11-26 DIAGNOSIS — D631 Anemia in chronic kidney disease: Secondary | ICD-10-CM | POA: Diagnosis not present

## 2023-11-26 DIAGNOSIS — N2581 Secondary hyperparathyroidism of renal origin: Secondary | ICD-10-CM | POA: Diagnosis not present

## 2023-11-26 DIAGNOSIS — E1129 Type 2 diabetes mellitus with other diabetic kidney complication: Secondary | ICD-10-CM | POA: Diagnosis not present

## 2023-11-26 DIAGNOSIS — N186 End stage renal disease: Secondary | ICD-10-CM | POA: Diagnosis not present

## 2023-11-26 DIAGNOSIS — D509 Iron deficiency anemia, unspecified: Secondary | ICD-10-CM | POA: Diagnosis not present

## 2023-11-26 DIAGNOSIS — Z992 Dependence on renal dialysis: Secondary | ICD-10-CM | POA: Diagnosis not present

## 2023-11-28 ENCOUNTER — Telehealth: Payer: Self-pay | Admitting: Internal Medicine

## 2023-11-28 ENCOUNTER — Ambulatory Visit: Payer: Self-pay

## 2023-11-28 ENCOUNTER — Ambulatory Visit (INDEPENDENT_AMBULATORY_CARE_PROVIDER_SITE_OTHER): Admitting: Internal Medicine

## 2023-11-28 ENCOUNTER — Encounter: Payer: Self-pay | Admitting: Internal Medicine

## 2023-11-28 VITALS — BP 130/72 | HR 90 | Temp 98.6°F | Ht 72.0 in | Wt 149.0 lb

## 2023-11-28 DIAGNOSIS — Z0000 Encounter for general adult medical examination without abnormal findings: Secondary | ICD-10-CM | POA: Diagnosis not present

## 2023-11-28 DIAGNOSIS — Z0001 Encounter for general adult medical examination with abnormal findings: Secondary | ICD-10-CM

## 2023-11-28 DIAGNOSIS — E782 Mixed hyperlipidemia: Secondary | ICD-10-CM

## 2023-11-28 DIAGNOSIS — H6121 Impacted cerumen, right ear: Secondary | ICD-10-CM | POA: Diagnosis not present

## 2023-11-28 DIAGNOSIS — F0390 Unspecified dementia without behavioral disturbance: Secondary | ICD-10-CM

## 2023-11-28 DIAGNOSIS — Z7985 Long-term (current) use of injectable non-insulin antidiabetic drugs: Secondary | ICD-10-CM

## 2023-11-28 DIAGNOSIS — Z1211 Encounter for screening for malignant neoplasm of colon: Secondary | ICD-10-CM

## 2023-11-28 DIAGNOSIS — Z23 Encounter for immunization: Secondary | ICD-10-CM

## 2023-11-28 DIAGNOSIS — E1129 Type 2 diabetes mellitus with other diabetic kidney complication: Secondary | ICD-10-CM | POA: Diagnosis not present

## 2023-11-28 DIAGNOSIS — I10 Essential (primary) hypertension: Secondary | ICD-10-CM

## 2023-11-28 LAB — CBC WITH DIFFERENTIAL/PLATELET
Basophils Absolute: 0.1 10*3/uL (ref 0.0–0.1)
Basophils Relative: 0.9 % (ref 0.0–3.0)
Eosinophils Absolute: 0.2 10*3/uL (ref 0.0–0.7)
Eosinophils Relative: 2.4 % (ref 0.0–5.0)
HCT: 36.1 % — ABNORMAL LOW (ref 39.0–52.0)
Hemoglobin: 12 g/dL — ABNORMAL LOW (ref 13.0–17.0)
Lymphocytes Relative: 21.3 % (ref 12.0–46.0)
Lymphs Abs: 2.1 10*3/uL (ref 0.7–4.0)
MCHC: 33.3 g/dL (ref 30.0–36.0)
MCV: 101.4 fl — ABNORMAL HIGH (ref 78.0–100.0)
Monocytes Absolute: 0.7 10*3/uL (ref 0.1–1.0)
Monocytes Relative: 7.1 % (ref 3.0–12.0)
Neutro Abs: 6.7 10*3/uL (ref 1.4–7.7)
Neutrophils Relative %: 68.3 % (ref 43.0–77.0)
Platelets: 239 10*3/uL (ref 150.0–400.0)
RBC: 3.56 Mil/uL — ABNORMAL LOW (ref 4.22–5.81)
RDW: 14.4 % (ref 11.5–15.5)
WBC: 9.9 10*3/uL (ref 4.0–10.5)

## 2023-11-28 LAB — LIPID PANEL
Cholesterol: 171 mg/dL (ref 0–200)
HDL: 50.2 mg/dL (ref >39.00)
LDL Cholesterol: 104 mg/dL — ABNORMAL HIGH (ref 0–99)
NonHDL: 120.78
Total CHOL/HDL Ratio: 3
Triglycerides: 84 mg/dL (ref 0.0–149.0)
VLDL: 16.8 mg/dL (ref 0.0–40.0)

## 2023-11-28 LAB — BASIC METABOLIC PANEL WITH GFR
BUN: 32 mg/dL — ABNORMAL HIGH (ref 6–23)
CO2: 33 meq/L — ABNORMAL HIGH (ref 19–32)
Calcium: 9.9 mg/dL (ref 8.4–10.5)
Chloride: 90 meq/L — ABNORMAL LOW (ref 96–112)
Creatinine, Ser: 10.61 mg/dL (ref 0.40–1.50)
GFR: 4.68 mL/min — CL (ref >60.00)
Glucose, Bld: 77 mg/dL (ref 70–99)
Potassium: 5.1 meq/L (ref 3.5–5.1)
Sodium: 135 meq/L (ref 135–145)

## 2023-11-28 LAB — HEPATIC FUNCTION PANEL
ALT: 7 U/L (ref 0–53)
AST: 12 U/L (ref 0–37)
Albumin: 4.2 g/dL (ref 3.5–5.2)
Alkaline Phosphatase: 67 U/L (ref 39–117)
Bilirubin, Direct: 0.1 mg/dL (ref 0.0–0.3)
Total Bilirubin: 0.7 mg/dL (ref 0.2–1.2)
Total Protein: 8 g/dL (ref 6.0–8.3)

## 2023-11-28 LAB — HEMOGLOBIN A1C: Hgb A1c MFr Bld: 5.5 % (ref 4.6–6.5)

## 2023-11-28 NOTE — Progress Notes (Signed)
 Patient ID: Christopher Lopez, male   DOB: 08-13-1960, 63 y.o.   MRN: [id number]         Chief Complaint:: wellness exam and dementia, dm, htn, hld       HPI:  Christopher Lopez is a 63 y.o. male here for wellness exam; for tdap and shingrix at pharmacy, due for colonoscopy, for prevnar 20 o/w up to date                        Also has right ear wax impaction with reduced hearing.  Pt denies chest pain, increased sob or doe, wheezing, orthopnea, PND, increased LE swelling, palpitations, dizziness or syncope.   Pt denies polydipsia, polyuria, or new focal neuro s/s.    Pt denies fever, wt loss, night sweats, loss of appetite, or other constitutional symptoms     Wt Readings from Last 3 Encounters:  11/28/23 149 lb (67.6 kg)  10/14/23 157 lb (71.2 kg)  05/23/23 149 lb 12.8 oz (67.9 kg)   BP Readings from Last 3 Encounters:  11/28/23 130/72  05/23/23 (!) 140/72  03/09/23 (!) 164/85   Immunization History  Administered Date(s) Administered   Fluad Trivalent(High Dose 65+) 05/23/2023   Hepatitis B, ADULT 01/10/2018, 02/11/2018, 03/14/2018, 07/10/2018   Hepb-cpg 05/29/2022   Influenza,inj,Quad PF,6+ Mos 02/27/2014, 08/22/2015, 09/06/2018, 03/09/2019, 05/06/2020, 04/01/2021, 04/21/2021, 04/14/2022   Moderna Sars-Covid-2 Vaccination 09/20/2019, 10/25/2019   PNEUMOCOCCAL CONJUGATE-20 11/28/2023   Pneumococcal Conjugate-13 09/06/2018, 10/31/2018   Pneumococcal Polysaccharide-23 08/20/2014, 08/22/2015   Tdap 09/05/2012   Health Maintenance Due  Topic Date Due   Zoster Vaccines- Shingrix (1 of 2) Never done   DTaP/Tdap/Td (2 - Td or Tdap) 09/05/2022   Colonoscopy  10/12/2023      Past Medical History:  Diagnosis Date   Alcohol abuse 09/18/2011   Allergic rhinitis, cause unspecified 09/20/2011   Anemia, unspecified 09/18/2011   Arthritis    Childhood asthma 09/20/2011   CKD (chronic kidney disease) stage 5, GFR less than 15 ml/min (HCC)    Dialysis - T/Th/Sa   Colitis 09/18/2011    Dementia (HCC) 09/18/2011   Diabetes mellitus    Diverticulosis of colon without hemorrhage 02/27/2014   Foot ulcer (HCC) 09/18/2011   High cholesterol    HTN (hypertension) 09/18/2011   Hyperlipidemia 11/08/2011   Hypertension    Impaired glucose tolerance 09/18/2011   Pneumonia 09/18/2011   Pre-ulcerative corn or callous 11/20/2011   Stroke (HCC)    left leg deficit   Type II or unspecified type diabetes mellitus without mention of complication, uncontrolled 09/20/2011   Wears glasses    Past Surgical History:  Procedure Laterality Date   A/V FISTULAGRAM Left 12/13/2022   Procedure: A/V Fistulagram;  Surgeon: Adine Hoof, MD;  Location: [facility] INVASIVE CV LAB;  Service: Cardiovascular;  Laterality: Left;   AV FISTULA PLACEMENT Left 12/23/2017   Procedure: CREATION OF BASILIAC - CEPHALIC  ARTERIOVENOUS (AV) FISTULA  STAGE ONE;  Surgeon: Adine Hoof, MD;  Location: [facility] OR;  Service: Vascular;  Laterality: Left;   AV FISTULA PLACEMENT Left 07/26/2018   Procedure: INSERTION OF ARTERIOVENOUS (AV) GORE-TEX GRAFT ARM;  Surgeon: Adine Hoof, MD;  Location: [facility] OR;  Service: Vascular;  Laterality: Left;   BASCILIC VEIN TRANSPOSITION Left 03/01/2018   Procedure: BASILIC VEIN TRANSPOSITION SECOND STAGE;  Surgeon: Adine Hoof, MD;  Location: [facility] OR;  Service: Vascular;  Laterality: Left;   INSERTION OF DIALYSIS CATHETER Right 12/23/2017  Procedure: INSERTION OF PALINDROME  DIALYSIS CATHETER;  Surgeon: Adine Hoof, MD;  Location: [facility] OR;  Service: Vascular;  Laterality: Right;   INSERTION OF DIALYSIS CATHETER Left 02/04/2023   Procedure: INSERTION OF TUNNELED DIALYSIS CATHETER;  Surgeon: Adine Hoof, MD;  Location: [facility] OR;  Service: Vascular;  Laterality: Left;   MASS EXCISION  02/04/2023   Procedure: EXCISION PSEUDOANEURYSM LEFT ARM;  Surgeon: Adine Hoof, MD;  Location: [facility] OR;  Service: Vascular;;   PERIPHERAL  VASCULAR BALLOON ANGIOPLASTY  12/13/2022   Procedure: PERIPHERAL VASCULAR BALLOON ANGIOPLASTY;  Surgeon: Adine Hoof, MD;  Location: [facility] INVASIVE CV LAB;  Service: Cardiovascular;;   REVISION OF ARTERIOVENOUS GORETEX GRAFT Left 02/04/2023   Procedure: REVISION OF LEFT ARM ARTERIOVENOUS GORETEX GRAFT USING 7mm GORETEX STRETCH GRAFT;  Surgeon: Adine Hoof, MD;  Location: [facility] OR;  Service: Vascular;  Laterality: Left;   THROMBECTOMY BRACHIAL ARTERY Left 07/26/2018   Procedure: THROMBECTOMY LEFT UPPER EXTREMITY;  Surgeon: Adine Hoof, MD;  Location: [facility] OR;  Service: Vascular;  Laterality: Left;   TONSILLECTOMY  1985   VEIN REPAIR Left 07/26/2018   Procedure: STENT LEFT AXILLARY VEIN;  Surgeon: Adine Hoof, MD;  Location: [facility] OR;  Service: Vascular;  Laterality: Left;    reports that he has been smoking cigarettes. He started smoking about 52 years ago. He has a 13.1 pack-year smoking history. He has been exposed to tobacco smoke. He has never used smokeless tobacco. He reports that he does not currently use alcohol after a past usage of about 2.0 standard drinks of alcohol per week. He reports that he does not use drugs. family history includes Diabetes in his maternal grandfather, mother, and sister; Heart disease in his mother; Prostate cancer in his paternal grandfather; Transient ischemic attack in his mother. Allergies  Allergen Reactions   Lipitor [Atorvastatin] Other (See Comments)    pain   Current Outpatient Medications on File Prior to Visit  Medication Sig Dispense Refill   acetaminophen  (TYLENOL ) 650 MG CR tablet Take 1,300 mg by mouth every 8 (eight) hours as needed for pain.     amLODipine  (NORVASC ) 2.5 MG tablet Take 1 tablet (2.5 mg total) by mouth daily. 90 tablet 3   aspirin  81 MG EC tablet Take 81 mg by mouth daily.     diphenhydrAMINE (BENADRYL) 25 mg capsule Take 75 mg by mouth daily as needed for itching.     Doxercalciferol   (HECTOROL  IV) Doxercalciferol  (Hectorol )     Doxercalciferol  (HECTOROL  IV) 6 mcg.     Dulaglutide  (TRULICITY ) 1.5 MG/0.5ML SOAJ Inject 1.5 mg into the skin once a week. 6 mL 3   glucose blood (ONE TOUCH ULTRA TEST) test strip Use as instructed once daily E11.9 100 each 12   Lancets (ONETOUCH ULTRASOFT) lancets 1 each by Other route 2 (two) times daily. Use to check blood sugars once a day Dx e11.9 100 each 3   losartan (COZAAR) 50 MG tablet Take 50 mg by mouth daily.     Methoxy PEG-Epoetin  Beta (MIRCERA IJ) 75 mcg.     metoprolol  succinate (TOPROL -XL) 50 MG 24 hr tablet TAKE 1 TABLET BY MOUTH EVERY DAY  WITH OR IMMEDIATELY FOLLOWING A MEAL 90 tablet 3   rosuvastatin  (CRESTOR ) 20 MG tablet TAKE 1 TABLET BY MOUTH EVERY DAY 90 tablet 3   SENSIPAR  30 MG tablet Take 30 mg by mouth daily.     VELPHORO 500 MG chewable tablet Chew 1,000 mg by mouth 2 (two)  times daily.     No current facility-administered medications on file prior to visit.        ROS:  All others reviewed and negative.  Objective        PE:  BP 130/72 (BP Location: Right Arm, Patient Position: Sitting, Cuff Size: Normal)   Pulse 90   Temp 98.6 F (37 C) (Oral)   Ht 6' (1.829 m)   Wt 149 lb (67.6 kg)   SpO2 98%   BMI 20.21 kg/m                 Constitutional: Pt appears in NAD               HENT: Head: NCAT.                Right Ear: External ear normal.  Right ext canal impaction resolved                Left Ear: External ear normal.                Eyes: . Pupils are equal, round, and reactive to light. Conjunctivae and EOM are normal               Nose: without d/c or deformity               Neck: Neck supple. Gross normal ROM               Cardiovascular: Normal rate and regular rhythm.                 Pulmonary/Chest: Effort normal and breath sounds without rales or wheezing.                Abd:  Soft, NT, ND, + BS, no organomegaly               Neurological: Pt is alert. At baseline orientation, motor grossly  intact               Skin: Skin is warm. No rashes, no other new lesions, LE edema - none               Psychiatric: Pt behavior is normal without agitation   Micro: none  Cardiac tracings I have personally interpreted today:  none  Pertinent Radiological findings (summarize): none   Lab Results  Component Value Date   WBC 9.9 11/28/2023   HGB 12.0 (L) 11/28/2023   HCT 36.1 (L) 11/28/2023   PLT 239.0 11/28/2023   GLUCOSE 77 11/28/2023   CHOL 171 11/28/2023   TRIG 84.0 11/28/2023   HDL 50.20 11/28/2023   LDLDIRECT 131.0 08/11/2017   LDLCALC 104 (H) 11/28/2023   ALT 7 11/28/2023   AST 12 11/28/2023   NA 135 11/28/2023   K 5.1 11/28/2023   CL 90 (L) 11/28/2023   CREATININE 10.61 (HH) 11/28/2023   BUN 32 (H) 11/28/2023   CO2 33 (H) 11/28/2023   TSH 1.80 11/28/2023   PSA 0.73 10/12/2021   HGBA1C 5.5 11/28/2023   MICROALBUR 98.3 (H) 08/11/2017   Assessment/Plan:  Christopher Lopez is a 63 y.o. Black or African American [2] male with  has a past medical history of Alcohol abuse (09/18/2011), Allergic rhinitis, cause unspecified (09/20/2011), Anemia, unspecified (09/18/2011), Arthritis, Childhood asthma (09/20/2011), CKD (chronic kidney disease) stage 5, GFR less than 15 ml/min (HCC), Colitis (09/18/2011), Dementia (HCC) (09/18/2011), Diabetes mellitus, Diverticulosis of colon without hemorrhage (02/27/2014), Foot ulcer (HCC) (09/18/2011), High cholesterol,  HTN (hypertension) (09/18/2011), Hyperlipidemia (11/08/2011), Hypertension, Impaired glucose tolerance (09/18/2011), Pneumonia (09/18/2011), Pre-ulcerative corn or callous (11/20/2011), Stroke (HCC), Type II or unspecified type diabetes mellitus without mention of complication, uncontrolled (09/20/2011), and Wears glasses.  Encounter for well adult exam with abnormal findings Age and sex appropriate education and counseling updated with regular exercise and diet Referrals for preventative services - for colonoscopy Immunizations  addressed - for prevar 20 today,, for shingrix at pharmacy Smoking counseling  - none needed Evidence for depression or other mood disorder - none significant Most recent labs reviewed. I have personally reviewed and have noted: 1) the patient's medical and social history 2) The patient's current medications and supplements 3) The patient's height, weight, and BMI have been recorded in the chart   Dementia without behavioral disturbance (HCC) Here with brother for support, overall stable without worsening behavioral  Essential (primary) hypertension BP Readings from Last 3 Encounters:  11/28/23 130/72  05/23/23 (!) 140/72  03/09/23 (!) 164/85   Stable, pt to continue medical treatment norvasc  2.5 every day, losartan 50 every day, toprol  xl 50 qd   Type 2 diabetes mellitus with other diabetic kidney complication (HCC) Lab Results  Component Value Date   HGBA1C 5.5 11/28/2023   Stable, pt to continue current medical treatment trulicity  1.5 mg    Hyperlipidemia, unspecified Lab Results  Component Value Date   LDLCALC 104 (H) 11/28/2023   Uncontrolled,, pt to continue current statin crestor  20 mg, declines change today   Cerumen impaction Resolved with tx, hearing improved Followup: Return in about 6 months (around 05/30/2024).  Rosalia Colonel, MD 11/29/2023 7:08 PM [facility]

## 2023-11-28 NOTE — Patient Instructions (Addendum)
 You had the Prevnar 20 pneumonia shot today  Your right ear was cleared of wax today  Please continue all other medications as before, and refills have been done if requested.  Please have the pharmacy call with any other refills you may need.  Please continue your efforts at being more active, low cholesterol diet, and weight control.  You are otherwise up to date with prevention measures today.  Please keep your appointments with your specialists as you may have planned  You will be contacted regarding the referral for: colonoscopy  Please go to the LAB at the blood drawing area for the tests to be done  You will be contacted by phone if any changes need to be made immediately.  Otherwise, you will receive a letter about your results with an explanation, but please check with MyChart first.  Please make an Appointment to return in 6 months, or sooner if needed

## 2023-11-28 NOTE — Telephone Encounter (Addendum)
 Hope, from [facility] lab with Critical labs: Creatinine 10.61 GFR 4.68 [facility] Dr Plotnikov contacted at 1715, given critical results.   Copied from CRM [id number]. Topic: Clinical - Lab/Test Results >> Nov 28, 2023  5:08 PM Chrystal Crape R wrote: Lab has stat results

## 2023-11-28 NOTE — Telephone Encounter (Signed)
 On call: reported Creat 10.61, GFR 4.68 He is in CRF in stage 5 on HD. Not a new problem. F/u w/Dr Anell Baptist w/HD

## 2023-11-29 ENCOUNTER — Ambulatory Visit: Payer: Self-pay | Admitting: Internal Medicine

## 2023-11-29 ENCOUNTER — Encounter: Payer: Self-pay | Admitting: Internal Medicine

## 2023-11-29 DIAGNOSIS — N2581 Secondary hyperparathyroidism of renal origin: Secondary | ICD-10-CM | POA: Diagnosis not present

## 2023-11-29 DIAGNOSIS — D631 Anemia in chronic kidney disease: Secondary | ICD-10-CM | POA: Diagnosis not present

## 2023-11-29 DIAGNOSIS — E1129 Type 2 diabetes mellitus with other diabetic kidney complication: Secondary | ICD-10-CM | POA: Diagnosis not present

## 2023-11-29 DIAGNOSIS — N186 End stage renal disease: Secondary | ICD-10-CM | POA: Diagnosis not present

## 2023-11-29 DIAGNOSIS — D509 Iron deficiency anemia, unspecified: Secondary | ICD-10-CM | POA: Diagnosis not present

## 2023-11-29 DIAGNOSIS — H6120 Impacted cerumen, unspecified ear: Secondary | ICD-10-CM | POA: Insufficient documentation

## 2023-11-29 DIAGNOSIS — Z992 Dependence on renal dialysis: Secondary | ICD-10-CM | POA: Diagnosis not present

## 2023-11-29 DIAGNOSIS — D689 Coagulation defect, unspecified: Secondary | ICD-10-CM | POA: Diagnosis not present

## 2023-11-29 LAB — TSH: TSH: 1.8 u[IU]/mL (ref 0.35–5.50)

## 2023-11-29 LAB — VITAMIN B12: Vitamin B-12: 171 pg/mL — ABNORMAL LOW (ref 211–911)

## 2023-11-29 NOTE — Assessment & Plan Note (Signed)
 Lab Results  Component Value Date   HGBA1C 5.5 11/28/2023   Stable, pt to continue current medical treatment trulicity  1.5 mg

## 2023-11-29 NOTE — Assessment & Plan Note (Signed)
 Here with brother for support, overall stable without worsening behavioral

## 2023-11-29 NOTE — Assessment & Plan Note (Signed)
 Lab Results  Component Value Date   LDLCALC 104 (H) 11/28/2023   Uncontrolled,, pt to continue current statin crestor  20 mg, declines change today

## 2023-11-29 NOTE — Assessment & Plan Note (Signed)
 Age and sex appropriate education and counseling updated with regular exercise and diet Referrals for preventative services - for colonoscopy Immunizations addressed - for prevar 20 today,, for shingrix at pharmacy Smoking counseling  - none needed Evidence for depression or other mood disorder - none significant Most recent labs reviewed. I have personally reviewed and have noted: 1) the patient's medical and social history 2) The patient's current medications and supplements 3) The patient's height, weight, and BMI have been recorded in the chart

## 2023-11-29 NOTE — Assessment & Plan Note (Signed)
 Resolved with tx, hearing improved

## 2023-11-29 NOTE — Assessment & Plan Note (Signed)
 BP Readings from Last 3 Encounters:  11/28/23 130/72  05/23/23 (!) 140/72  03/09/23 (!) 164/85   Stable, pt to continue medical treatment norvasc  2.5 every day, losartan 50 every day, toprol  xl 50 qd

## 2023-11-30 ENCOUNTER — Ambulatory Visit: Admitting: Internal Medicine

## 2023-12-01 DIAGNOSIS — D631 Anemia in chronic kidney disease: Secondary | ICD-10-CM | POA: Diagnosis not present

## 2023-12-01 DIAGNOSIS — D509 Iron deficiency anemia, unspecified: Secondary | ICD-10-CM | POA: Diagnosis not present

## 2023-12-01 DIAGNOSIS — E1129 Type 2 diabetes mellitus with other diabetic kidney complication: Secondary | ICD-10-CM | POA: Diagnosis not present

## 2023-12-01 DIAGNOSIS — N186 End stage renal disease: Secondary | ICD-10-CM | POA: Diagnosis not present

## 2023-12-01 DIAGNOSIS — Z992 Dependence on renal dialysis: Secondary | ICD-10-CM | POA: Diagnosis not present

## 2023-12-01 DIAGNOSIS — D689 Coagulation defect, unspecified: Secondary | ICD-10-CM | POA: Diagnosis not present

## 2023-12-01 DIAGNOSIS — N2581 Secondary hyperparathyroidism of renal origin: Secondary | ICD-10-CM | POA: Diagnosis not present

## 2023-12-03 DIAGNOSIS — D631 Anemia in chronic kidney disease: Secondary | ICD-10-CM | POA: Diagnosis not present

## 2023-12-03 DIAGNOSIS — D689 Coagulation defect, unspecified: Secondary | ICD-10-CM | POA: Diagnosis not present

## 2023-12-03 DIAGNOSIS — E1129 Type 2 diabetes mellitus with other diabetic kidney complication: Secondary | ICD-10-CM | POA: Diagnosis not present

## 2023-12-03 DIAGNOSIS — Z992 Dependence on renal dialysis: Secondary | ICD-10-CM | POA: Diagnosis not present

## 2023-12-03 DIAGNOSIS — N186 End stage renal disease: Secondary | ICD-10-CM | POA: Diagnosis not present

## 2023-12-03 DIAGNOSIS — D509 Iron deficiency anemia, unspecified: Secondary | ICD-10-CM | POA: Diagnosis not present

## 2023-12-03 DIAGNOSIS — N2581 Secondary hyperparathyroidism of renal origin: Secondary | ICD-10-CM | POA: Diagnosis not present

## 2023-12-06 DIAGNOSIS — N2581 Secondary hyperparathyroidism of renal origin: Secondary | ICD-10-CM | POA: Diagnosis not present

## 2023-12-06 DIAGNOSIS — E1129 Type 2 diabetes mellitus with other diabetic kidney complication: Secondary | ICD-10-CM | POA: Diagnosis not present

## 2023-12-06 DIAGNOSIS — D631 Anemia in chronic kidney disease: Secondary | ICD-10-CM | POA: Diagnosis not present

## 2023-12-06 DIAGNOSIS — N186 End stage renal disease: Secondary | ICD-10-CM | POA: Diagnosis not present

## 2023-12-06 DIAGNOSIS — Z992 Dependence on renal dialysis: Secondary | ICD-10-CM | POA: Diagnosis not present

## 2023-12-06 DIAGNOSIS — D509 Iron deficiency anemia, unspecified: Secondary | ICD-10-CM | POA: Diagnosis not present

## 2023-12-06 DIAGNOSIS — D689 Coagulation defect, unspecified: Secondary | ICD-10-CM | POA: Diagnosis not present

## 2023-12-08 DIAGNOSIS — E1129 Type 2 diabetes mellitus with other diabetic kidney complication: Secondary | ICD-10-CM | POA: Diagnosis not present

## 2023-12-08 DIAGNOSIS — D689 Coagulation defect, unspecified: Secondary | ICD-10-CM | POA: Diagnosis not present

## 2023-12-08 DIAGNOSIS — N2581 Secondary hyperparathyroidism of renal origin: Secondary | ICD-10-CM | POA: Diagnosis not present

## 2023-12-08 DIAGNOSIS — D631 Anemia in chronic kidney disease: Secondary | ICD-10-CM | POA: Diagnosis not present

## 2023-12-08 DIAGNOSIS — Z992 Dependence on renal dialysis: Secondary | ICD-10-CM | POA: Diagnosis not present

## 2023-12-08 DIAGNOSIS — N186 End stage renal disease: Secondary | ICD-10-CM | POA: Diagnosis not present

## 2023-12-08 DIAGNOSIS — D509 Iron deficiency anemia, unspecified: Secondary | ICD-10-CM | POA: Diagnosis not present

## 2023-12-10 DIAGNOSIS — E1129 Type 2 diabetes mellitus with other diabetic kidney complication: Secondary | ICD-10-CM | POA: Diagnosis not present

## 2023-12-10 DIAGNOSIS — N2581 Secondary hyperparathyroidism of renal origin: Secondary | ICD-10-CM | POA: Diagnosis not present

## 2023-12-10 DIAGNOSIS — D509 Iron deficiency anemia, unspecified: Secondary | ICD-10-CM | POA: Diagnosis not present

## 2023-12-10 DIAGNOSIS — D689 Coagulation defect, unspecified: Secondary | ICD-10-CM | POA: Diagnosis not present

## 2023-12-10 DIAGNOSIS — D631 Anemia in chronic kidney disease: Secondary | ICD-10-CM | POA: Diagnosis not present

## 2023-12-10 DIAGNOSIS — Z992 Dependence on renal dialysis: Secondary | ICD-10-CM | POA: Diagnosis not present

## 2023-12-10 DIAGNOSIS — N186 End stage renal disease: Secondary | ICD-10-CM | POA: Diagnosis not present

## 2023-12-10 DIAGNOSIS — E1122 Type 2 diabetes mellitus with diabetic chronic kidney disease: Secondary | ICD-10-CM | POA: Diagnosis not present

## 2023-12-13 DIAGNOSIS — D689 Coagulation defect, unspecified: Secondary | ICD-10-CM | POA: Diagnosis not present

## 2023-12-13 DIAGNOSIS — E1129 Type 2 diabetes mellitus with other diabetic kidney complication: Secondary | ICD-10-CM | POA: Diagnosis not present

## 2023-12-13 DIAGNOSIS — Z992 Dependence on renal dialysis: Secondary | ICD-10-CM | POA: Diagnosis not present

## 2023-12-13 DIAGNOSIS — D509 Iron deficiency anemia, unspecified: Secondary | ICD-10-CM | POA: Diagnosis not present

## 2023-12-13 DIAGNOSIS — N2581 Secondary hyperparathyroidism of renal origin: Secondary | ICD-10-CM | POA: Diagnosis not present

## 2023-12-13 DIAGNOSIS — N186 End stage renal disease: Secondary | ICD-10-CM | POA: Diagnosis not present

## 2023-12-13 DIAGNOSIS — D631 Anemia in chronic kidney disease: Secondary | ICD-10-CM | POA: Diagnosis not present

## 2023-12-15 DIAGNOSIS — E1129 Type 2 diabetes mellitus with other diabetic kidney complication: Secondary | ICD-10-CM | POA: Diagnosis not present

## 2023-12-15 DIAGNOSIS — Z992 Dependence on renal dialysis: Secondary | ICD-10-CM | POA: Diagnosis not present

## 2023-12-15 DIAGNOSIS — D509 Iron deficiency anemia, unspecified: Secondary | ICD-10-CM | POA: Diagnosis not present

## 2023-12-15 DIAGNOSIS — D631 Anemia in chronic kidney disease: Secondary | ICD-10-CM | POA: Diagnosis not present

## 2023-12-15 DIAGNOSIS — N2581 Secondary hyperparathyroidism of renal origin: Secondary | ICD-10-CM | POA: Diagnosis not present

## 2023-12-15 DIAGNOSIS — D689 Coagulation defect, unspecified: Secondary | ICD-10-CM | POA: Diagnosis not present

## 2023-12-15 DIAGNOSIS — N186 End stage renal disease: Secondary | ICD-10-CM | POA: Diagnosis not present

## 2023-12-17 DIAGNOSIS — Z992 Dependence on renal dialysis: Secondary | ICD-10-CM | POA: Diagnosis not present

## 2023-12-17 DIAGNOSIS — D689 Coagulation defect, unspecified: Secondary | ICD-10-CM | POA: Diagnosis not present

## 2023-12-17 DIAGNOSIS — D631 Anemia in chronic kidney disease: Secondary | ICD-10-CM | POA: Diagnosis not present

## 2023-12-17 DIAGNOSIS — E1129 Type 2 diabetes mellitus with other diabetic kidney complication: Secondary | ICD-10-CM | POA: Diagnosis not present

## 2023-12-17 DIAGNOSIS — D509 Iron deficiency anemia, unspecified: Secondary | ICD-10-CM | POA: Diagnosis not present

## 2023-12-17 DIAGNOSIS — N186 End stage renal disease: Secondary | ICD-10-CM | POA: Diagnosis not present

## 2023-12-17 DIAGNOSIS — N2581 Secondary hyperparathyroidism of renal origin: Secondary | ICD-10-CM | POA: Diagnosis not present

## 2023-12-19 DIAGNOSIS — D509 Iron deficiency anemia, unspecified: Secondary | ICD-10-CM | POA: Diagnosis not present

## 2023-12-19 DIAGNOSIS — N186 End stage renal disease: Secondary | ICD-10-CM | POA: Diagnosis not present

## 2023-12-19 DIAGNOSIS — D631 Anemia in chronic kidney disease: Secondary | ICD-10-CM | POA: Diagnosis not present

## 2023-12-19 DIAGNOSIS — Z992 Dependence on renal dialysis: Secondary | ICD-10-CM | POA: Diagnosis not present

## 2023-12-19 DIAGNOSIS — E1129 Type 2 diabetes mellitus with other diabetic kidney complication: Secondary | ICD-10-CM | POA: Diagnosis not present

## 2023-12-19 DIAGNOSIS — D689 Coagulation defect, unspecified: Secondary | ICD-10-CM | POA: Diagnosis not present

## 2023-12-19 DIAGNOSIS — N2581 Secondary hyperparathyroidism of renal origin: Secondary | ICD-10-CM | POA: Diagnosis not present

## 2023-12-20 DIAGNOSIS — N2581 Secondary hyperparathyroidism of renal origin: Secondary | ICD-10-CM | POA: Diagnosis not present

## 2023-12-20 DIAGNOSIS — Z992 Dependence on renal dialysis: Secondary | ICD-10-CM | POA: Diagnosis not present

## 2023-12-20 DIAGNOSIS — D689 Coagulation defect, unspecified: Secondary | ICD-10-CM | POA: Diagnosis not present

## 2023-12-20 DIAGNOSIS — D509 Iron deficiency anemia, unspecified: Secondary | ICD-10-CM | POA: Diagnosis not present

## 2023-12-20 DIAGNOSIS — D631 Anemia in chronic kidney disease: Secondary | ICD-10-CM | POA: Diagnosis not present

## 2023-12-20 DIAGNOSIS — E1129 Type 2 diabetes mellitus with other diabetic kidney complication: Secondary | ICD-10-CM | POA: Diagnosis not present

## 2023-12-20 DIAGNOSIS — N186 End stage renal disease: Secondary | ICD-10-CM | POA: Diagnosis not present

## 2023-12-22 DIAGNOSIS — N2581 Secondary hyperparathyroidism of renal origin: Secondary | ICD-10-CM | POA: Diagnosis not present

## 2023-12-22 DIAGNOSIS — N186 End stage renal disease: Secondary | ICD-10-CM | POA: Diagnosis not present

## 2023-12-22 DIAGNOSIS — D631 Anemia in chronic kidney disease: Secondary | ICD-10-CM | POA: Diagnosis not present

## 2023-12-22 DIAGNOSIS — Z992 Dependence on renal dialysis: Secondary | ICD-10-CM | POA: Diagnosis not present

## 2023-12-22 DIAGNOSIS — D509 Iron deficiency anemia, unspecified: Secondary | ICD-10-CM | POA: Diagnosis not present

## 2023-12-22 DIAGNOSIS — D689 Coagulation defect, unspecified: Secondary | ICD-10-CM | POA: Diagnosis not present

## 2023-12-22 DIAGNOSIS — E1129 Type 2 diabetes mellitus with other diabetic kidney complication: Secondary | ICD-10-CM | POA: Diagnosis not present

## 2023-12-24 DIAGNOSIS — D631 Anemia in chronic kidney disease: Secondary | ICD-10-CM | POA: Diagnosis not present

## 2023-12-24 DIAGNOSIS — N186 End stage renal disease: Secondary | ICD-10-CM | POA: Diagnosis not present

## 2023-12-24 DIAGNOSIS — D509 Iron deficiency anemia, unspecified: Secondary | ICD-10-CM | POA: Diagnosis not present

## 2023-12-24 DIAGNOSIS — Z992 Dependence on renal dialysis: Secondary | ICD-10-CM | POA: Diagnosis not present

## 2023-12-24 DIAGNOSIS — N2581 Secondary hyperparathyroidism of renal origin: Secondary | ICD-10-CM | POA: Diagnosis not present

## 2023-12-24 DIAGNOSIS — E1129 Type 2 diabetes mellitus with other diabetic kidney complication: Secondary | ICD-10-CM | POA: Diagnosis not present

## 2023-12-24 DIAGNOSIS — D689 Coagulation defect, unspecified: Secondary | ICD-10-CM | POA: Diagnosis not present

## 2023-12-27 DIAGNOSIS — N2581 Secondary hyperparathyroidism of renal origin: Secondary | ICD-10-CM | POA: Diagnosis not present

## 2023-12-27 DIAGNOSIS — Z992 Dependence on renal dialysis: Secondary | ICD-10-CM | POA: Diagnosis not present

## 2023-12-27 DIAGNOSIS — E1129 Type 2 diabetes mellitus with other diabetic kidney complication: Secondary | ICD-10-CM | POA: Diagnosis not present

## 2023-12-27 DIAGNOSIS — D509 Iron deficiency anemia, unspecified: Secondary | ICD-10-CM | POA: Diagnosis not present

## 2023-12-27 DIAGNOSIS — D631 Anemia in chronic kidney disease: Secondary | ICD-10-CM | POA: Diagnosis not present

## 2023-12-27 DIAGNOSIS — N186 End stage renal disease: Secondary | ICD-10-CM | POA: Diagnosis not present

## 2023-12-27 DIAGNOSIS — D689 Coagulation defect, unspecified: Secondary | ICD-10-CM | POA: Diagnosis not present

## 2023-12-29 DIAGNOSIS — D631 Anemia in chronic kidney disease: Secondary | ICD-10-CM | POA: Diagnosis not present

## 2023-12-29 DIAGNOSIS — N186 End stage renal disease: Secondary | ICD-10-CM | POA: Diagnosis not present

## 2023-12-29 DIAGNOSIS — D509 Iron deficiency anemia, unspecified: Secondary | ICD-10-CM | POA: Diagnosis not present

## 2023-12-29 DIAGNOSIS — N2581 Secondary hyperparathyroidism of renal origin: Secondary | ICD-10-CM | POA: Diagnosis not present

## 2023-12-29 DIAGNOSIS — E1129 Type 2 diabetes mellitus with other diabetic kidney complication: Secondary | ICD-10-CM | POA: Diagnosis not present

## 2023-12-29 DIAGNOSIS — D689 Coagulation defect, unspecified: Secondary | ICD-10-CM | POA: Diagnosis not present

## 2023-12-29 DIAGNOSIS — Z992 Dependence on renal dialysis: Secondary | ICD-10-CM | POA: Diagnosis not present

## 2023-12-31 DIAGNOSIS — N2581 Secondary hyperparathyroidism of renal origin: Secondary | ICD-10-CM | POA: Diagnosis not present

## 2023-12-31 DIAGNOSIS — D509 Iron deficiency anemia, unspecified: Secondary | ICD-10-CM | POA: Diagnosis not present

## 2023-12-31 DIAGNOSIS — D631 Anemia in chronic kidney disease: Secondary | ICD-10-CM | POA: Diagnosis not present

## 2023-12-31 DIAGNOSIS — E1129 Type 2 diabetes mellitus with other diabetic kidney complication: Secondary | ICD-10-CM | POA: Diagnosis not present

## 2023-12-31 DIAGNOSIS — N186 End stage renal disease: Secondary | ICD-10-CM | POA: Diagnosis not present

## 2023-12-31 DIAGNOSIS — Z992 Dependence on renal dialysis: Secondary | ICD-10-CM | POA: Diagnosis not present

## 2023-12-31 DIAGNOSIS — D689 Coagulation defect, unspecified: Secondary | ICD-10-CM | POA: Diagnosis not present

## 2024-01-02 ENCOUNTER — Ambulatory Visit: Admitting: Podiatry

## 2024-01-03 DIAGNOSIS — D509 Iron deficiency anemia, unspecified: Secondary | ICD-10-CM | POA: Diagnosis not present

## 2024-01-03 DIAGNOSIS — N2581 Secondary hyperparathyroidism of renal origin: Secondary | ICD-10-CM | POA: Diagnosis not present

## 2024-01-03 DIAGNOSIS — D631 Anemia in chronic kidney disease: Secondary | ICD-10-CM | POA: Diagnosis not present

## 2024-01-03 DIAGNOSIS — N186 End stage renal disease: Secondary | ICD-10-CM | POA: Diagnosis not present

## 2024-01-03 DIAGNOSIS — E1129 Type 2 diabetes mellitus with other diabetic kidney complication: Secondary | ICD-10-CM | POA: Diagnosis not present

## 2024-01-03 DIAGNOSIS — Z992 Dependence on renal dialysis: Secondary | ICD-10-CM | POA: Diagnosis not present

## 2024-01-03 DIAGNOSIS — D689 Coagulation defect, unspecified: Secondary | ICD-10-CM | POA: Diagnosis not present

## 2024-01-05 DIAGNOSIS — D689 Coagulation defect, unspecified: Secondary | ICD-10-CM | POA: Diagnosis not present

## 2024-01-05 DIAGNOSIS — N2581 Secondary hyperparathyroidism of renal origin: Secondary | ICD-10-CM | POA: Diagnosis not present

## 2024-01-05 DIAGNOSIS — E1129 Type 2 diabetes mellitus with other diabetic kidney complication: Secondary | ICD-10-CM | POA: Diagnosis not present

## 2024-01-05 DIAGNOSIS — D631 Anemia in chronic kidney disease: Secondary | ICD-10-CM | POA: Diagnosis not present

## 2024-01-05 DIAGNOSIS — Z992 Dependence on renal dialysis: Secondary | ICD-10-CM | POA: Diagnosis not present

## 2024-01-05 DIAGNOSIS — N186 End stage renal disease: Secondary | ICD-10-CM | POA: Diagnosis not present

## 2024-01-05 DIAGNOSIS — D509 Iron deficiency anemia, unspecified: Secondary | ICD-10-CM | POA: Diagnosis not present

## 2024-01-07 DIAGNOSIS — N186 End stage renal disease: Secondary | ICD-10-CM | POA: Diagnosis not present

## 2024-01-07 DIAGNOSIS — N2581 Secondary hyperparathyroidism of renal origin: Secondary | ICD-10-CM | POA: Diagnosis not present

## 2024-01-07 DIAGNOSIS — E1129 Type 2 diabetes mellitus with other diabetic kidney complication: Secondary | ICD-10-CM | POA: Diagnosis not present

## 2024-01-07 DIAGNOSIS — D509 Iron deficiency anemia, unspecified: Secondary | ICD-10-CM | POA: Diagnosis not present

## 2024-01-07 DIAGNOSIS — Z992 Dependence on renal dialysis: Secondary | ICD-10-CM | POA: Diagnosis not present

## 2024-01-07 DIAGNOSIS — D631 Anemia in chronic kidney disease: Secondary | ICD-10-CM | POA: Diagnosis not present

## 2024-01-07 DIAGNOSIS — D689 Coagulation defect, unspecified: Secondary | ICD-10-CM | POA: Diagnosis not present

## 2024-01-09 DIAGNOSIS — Z992 Dependence on renal dialysis: Secondary | ICD-10-CM | POA: Diagnosis not present

## 2024-01-09 DIAGNOSIS — N186 End stage renal disease: Secondary | ICD-10-CM | POA: Diagnosis not present

## 2024-01-09 DIAGNOSIS — E1122 Type 2 diabetes mellitus with diabetic chronic kidney disease: Secondary | ICD-10-CM | POA: Diagnosis not present

## 2024-01-10 DIAGNOSIS — N2581 Secondary hyperparathyroidism of renal origin: Secondary | ICD-10-CM | POA: Diagnosis not present

## 2024-01-10 DIAGNOSIS — D689 Coagulation defect, unspecified: Secondary | ICD-10-CM | POA: Diagnosis not present

## 2024-01-10 DIAGNOSIS — E8770 Fluid overload, unspecified: Secondary | ICD-10-CM | POA: Diagnosis not present

## 2024-01-10 DIAGNOSIS — D509 Iron deficiency anemia, unspecified: Secondary | ICD-10-CM | POA: Diagnosis not present

## 2024-01-10 DIAGNOSIS — D631 Anemia in chronic kidney disease: Secondary | ICD-10-CM | POA: Diagnosis not present

## 2024-01-10 DIAGNOSIS — N186 End stage renal disease: Secondary | ICD-10-CM | POA: Diagnosis not present

## 2024-01-10 DIAGNOSIS — Z992 Dependence on renal dialysis: Secondary | ICD-10-CM | POA: Diagnosis not present

## 2024-01-10 DIAGNOSIS — E1129 Type 2 diabetes mellitus with other diabetic kidney complication: Secondary | ICD-10-CM | POA: Diagnosis not present

## 2024-01-12 DIAGNOSIS — N186 End stage renal disease: Secondary | ICD-10-CM | POA: Diagnosis not present

## 2024-01-12 DIAGNOSIS — E8770 Fluid overload, unspecified: Secondary | ICD-10-CM | POA: Diagnosis not present

## 2024-01-12 DIAGNOSIS — D509 Iron deficiency anemia, unspecified: Secondary | ICD-10-CM | POA: Diagnosis not present

## 2024-01-12 DIAGNOSIS — N2581 Secondary hyperparathyroidism of renal origin: Secondary | ICD-10-CM | POA: Diagnosis not present

## 2024-01-12 DIAGNOSIS — D689 Coagulation defect, unspecified: Secondary | ICD-10-CM | POA: Diagnosis not present

## 2024-01-12 DIAGNOSIS — E1129 Type 2 diabetes mellitus with other diabetic kidney complication: Secondary | ICD-10-CM | POA: Diagnosis not present

## 2024-01-12 DIAGNOSIS — D631 Anemia in chronic kidney disease: Secondary | ICD-10-CM | POA: Diagnosis not present

## 2024-01-12 DIAGNOSIS — Z992 Dependence on renal dialysis: Secondary | ICD-10-CM | POA: Diagnosis not present

## 2024-01-14 DIAGNOSIS — N2581 Secondary hyperparathyroidism of renal origin: Secondary | ICD-10-CM | POA: Diagnosis not present

## 2024-01-14 DIAGNOSIS — D689 Coagulation defect, unspecified: Secondary | ICD-10-CM | POA: Diagnosis not present

## 2024-01-14 DIAGNOSIS — Z992 Dependence on renal dialysis: Secondary | ICD-10-CM | POA: Diagnosis not present

## 2024-01-14 DIAGNOSIS — D509 Iron deficiency anemia, unspecified: Secondary | ICD-10-CM | POA: Diagnosis not present

## 2024-01-14 DIAGNOSIS — N186 End stage renal disease: Secondary | ICD-10-CM | POA: Diagnosis not present

## 2024-01-14 DIAGNOSIS — D631 Anemia in chronic kidney disease: Secondary | ICD-10-CM | POA: Diagnosis not present

## 2024-01-14 DIAGNOSIS — E1129 Type 2 diabetes mellitus with other diabetic kidney complication: Secondary | ICD-10-CM | POA: Diagnosis not present

## 2024-01-14 DIAGNOSIS — E8770 Fluid overload, unspecified: Secondary | ICD-10-CM | POA: Diagnosis not present

## 2024-01-17 DIAGNOSIS — N186 End stage renal disease: Secondary | ICD-10-CM | POA: Diagnosis not present

## 2024-01-17 DIAGNOSIS — Z992 Dependence on renal dialysis: Secondary | ICD-10-CM | POA: Diagnosis not present

## 2024-01-17 DIAGNOSIS — D631 Anemia in chronic kidney disease: Secondary | ICD-10-CM | POA: Diagnosis not present

## 2024-01-17 DIAGNOSIS — D689 Coagulation defect, unspecified: Secondary | ICD-10-CM | POA: Diagnosis not present

## 2024-01-17 DIAGNOSIS — E8770 Fluid overload, unspecified: Secondary | ICD-10-CM | POA: Diagnosis not present

## 2024-01-17 DIAGNOSIS — D509 Iron deficiency anemia, unspecified: Secondary | ICD-10-CM | POA: Diagnosis not present

## 2024-01-17 DIAGNOSIS — E1129 Type 2 diabetes mellitus with other diabetic kidney complication: Secondary | ICD-10-CM | POA: Diagnosis not present

## 2024-01-17 DIAGNOSIS — N2581 Secondary hyperparathyroidism of renal origin: Secondary | ICD-10-CM | POA: Diagnosis not present

## 2024-01-19 DIAGNOSIS — Z992 Dependence on renal dialysis: Secondary | ICD-10-CM | POA: Diagnosis not present

## 2024-01-19 DIAGNOSIS — D689 Coagulation defect, unspecified: Secondary | ICD-10-CM | POA: Diagnosis not present

## 2024-01-19 DIAGNOSIS — E1129 Type 2 diabetes mellitus with other diabetic kidney complication: Secondary | ICD-10-CM | POA: Diagnosis not present

## 2024-01-19 DIAGNOSIS — N2581 Secondary hyperparathyroidism of renal origin: Secondary | ICD-10-CM | POA: Diagnosis not present

## 2024-01-19 DIAGNOSIS — N186 End stage renal disease: Secondary | ICD-10-CM | POA: Diagnosis not present

## 2024-01-19 DIAGNOSIS — D509 Iron deficiency anemia, unspecified: Secondary | ICD-10-CM | POA: Diagnosis not present

## 2024-01-19 DIAGNOSIS — E8770 Fluid overload, unspecified: Secondary | ICD-10-CM | POA: Diagnosis not present

## 2024-01-19 DIAGNOSIS — D631 Anemia in chronic kidney disease: Secondary | ICD-10-CM | POA: Diagnosis not present

## 2024-01-21 DIAGNOSIS — Z992 Dependence on renal dialysis: Secondary | ICD-10-CM | POA: Diagnosis not present

## 2024-01-21 DIAGNOSIS — N186 End stage renal disease: Secondary | ICD-10-CM | POA: Diagnosis not present

## 2024-01-21 DIAGNOSIS — D689 Coagulation defect, unspecified: Secondary | ICD-10-CM | POA: Diagnosis not present

## 2024-01-21 DIAGNOSIS — E1129 Type 2 diabetes mellitus with other diabetic kidney complication: Secondary | ICD-10-CM | POA: Diagnosis not present

## 2024-01-21 DIAGNOSIS — E8770 Fluid overload, unspecified: Secondary | ICD-10-CM | POA: Diagnosis not present

## 2024-01-21 DIAGNOSIS — D631 Anemia in chronic kidney disease: Secondary | ICD-10-CM | POA: Diagnosis not present

## 2024-01-21 DIAGNOSIS — D509 Iron deficiency anemia, unspecified: Secondary | ICD-10-CM | POA: Diagnosis not present

## 2024-01-21 DIAGNOSIS — N2581 Secondary hyperparathyroidism of renal origin: Secondary | ICD-10-CM | POA: Diagnosis not present

## 2024-01-24 DIAGNOSIS — D509 Iron deficiency anemia, unspecified: Secondary | ICD-10-CM | POA: Diagnosis not present

## 2024-01-24 DIAGNOSIS — D689 Coagulation defect, unspecified: Secondary | ICD-10-CM | POA: Diagnosis not present

## 2024-01-24 DIAGNOSIS — E8770 Fluid overload, unspecified: Secondary | ICD-10-CM | POA: Diagnosis not present

## 2024-01-24 DIAGNOSIS — D631 Anemia in chronic kidney disease: Secondary | ICD-10-CM | POA: Diagnosis not present

## 2024-01-24 DIAGNOSIS — E1129 Type 2 diabetes mellitus with other diabetic kidney complication: Secondary | ICD-10-CM | POA: Diagnosis not present

## 2024-01-24 DIAGNOSIS — Z992 Dependence on renal dialysis: Secondary | ICD-10-CM | POA: Diagnosis not present

## 2024-01-24 DIAGNOSIS — N186 End stage renal disease: Secondary | ICD-10-CM | POA: Diagnosis not present

## 2024-01-24 DIAGNOSIS — N2581 Secondary hyperparathyroidism of renal origin: Secondary | ICD-10-CM | POA: Diagnosis not present

## 2024-01-25 DIAGNOSIS — D631 Anemia in chronic kidney disease: Secondary | ICD-10-CM | POA: Diagnosis not present

## 2024-01-25 DIAGNOSIS — N186 End stage renal disease: Secondary | ICD-10-CM | POA: Diagnosis not present

## 2024-01-25 DIAGNOSIS — E1129 Type 2 diabetes mellitus with other diabetic kidney complication: Secondary | ICD-10-CM | POA: Diagnosis not present

## 2024-01-25 DIAGNOSIS — N2581 Secondary hyperparathyroidism of renal origin: Secondary | ICD-10-CM | POA: Diagnosis not present

## 2024-01-25 DIAGNOSIS — Z992 Dependence on renal dialysis: Secondary | ICD-10-CM | POA: Diagnosis not present

## 2024-01-25 DIAGNOSIS — D509 Iron deficiency anemia, unspecified: Secondary | ICD-10-CM | POA: Diagnosis not present

## 2024-01-25 DIAGNOSIS — E8770 Fluid overload, unspecified: Secondary | ICD-10-CM | POA: Diagnosis not present

## 2024-01-25 DIAGNOSIS — D689 Coagulation defect, unspecified: Secondary | ICD-10-CM | POA: Diagnosis not present

## 2024-01-26 DIAGNOSIS — E1129 Type 2 diabetes mellitus with other diabetic kidney complication: Secondary | ICD-10-CM | POA: Diagnosis not present

## 2024-01-26 DIAGNOSIS — D689 Coagulation defect, unspecified: Secondary | ICD-10-CM | POA: Diagnosis not present

## 2024-01-26 DIAGNOSIS — D631 Anemia in chronic kidney disease: Secondary | ICD-10-CM | POA: Diagnosis not present

## 2024-01-26 DIAGNOSIS — N2581 Secondary hyperparathyroidism of renal origin: Secondary | ICD-10-CM | POA: Diagnosis not present

## 2024-01-26 DIAGNOSIS — Z992 Dependence on renal dialysis: Secondary | ICD-10-CM | POA: Diagnosis not present

## 2024-01-26 DIAGNOSIS — D509 Iron deficiency anemia, unspecified: Secondary | ICD-10-CM | POA: Diagnosis not present

## 2024-01-26 DIAGNOSIS — N186 End stage renal disease: Secondary | ICD-10-CM | POA: Diagnosis not present

## 2024-01-26 DIAGNOSIS — E8770 Fluid overload, unspecified: Secondary | ICD-10-CM | POA: Diagnosis not present

## 2024-01-28 DIAGNOSIS — N186 End stage renal disease: Secondary | ICD-10-CM | POA: Diagnosis not present

## 2024-01-28 DIAGNOSIS — E8770 Fluid overload, unspecified: Secondary | ICD-10-CM | POA: Diagnosis not present

## 2024-01-28 DIAGNOSIS — D689 Coagulation defect, unspecified: Secondary | ICD-10-CM | POA: Diagnosis not present

## 2024-01-28 DIAGNOSIS — Z992 Dependence on renal dialysis: Secondary | ICD-10-CM | POA: Diagnosis not present

## 2024-01-28 DIAGNOSIS — D631 Anemia in chronic kidney disease: Secondary | ICD-10-CM | POA: Diagnosis not present

## 2024-01-28 DIAGNOSIS — E1129 Type 2 diabetes mellitus with other diabetic kidney complication: Secondary | ICD-10-CM | POA: Diagnosis not present

## 2024-01-28 DIAGNOSIS — D509 Iron deficiency anemia, unspecified: Secondary | ICD-10-CM | POA: Diagnosis not present

## 2024-01-28 DIAGNOSIS — N2581 Secondary hyperparathyroidism of renal origin: Secondary | ICD-10-CM | POA: Diagnosis not present

## 2024-01-30 ENCOUNTER — Ambulatory Visit: Admitting: Podiatry

## 2024-01-31 DIAGNOSIS — N186 End stage renal disease: Secondary | ICD-10-CM | POA: Diagnosis not present

## 2024-01-31 DIAGNOSIS — E1129 Type 2 diabetes mellitus with other diabetic kidney complication: Secondary | ICD-10-CM | POA: Diagnosis not present

## 2024-01-31 DIAGNOSIS — D631 Anemia in chronic kidney disease: Secondary | ICD-10-CM | POA: Diagnosis not present

## 2024-01-31 DIAGNOSIS — E8770 Fluid overload, unspecified: Secondary | ICD-10-CM | POA: Diagnosis not present

## 2024-01-31 DIAGNOSIS — D689 Coagulation defect, unspecified: Secondary | ICD-10-CM | POA: Diagnosis not present

## 2024-01-31 DIAGNOSIS — Z992 Dependence on renal dialysis: Secondary | ICD-10-CM | POA: Diagnosis not present

## 2024-01-31 DIAGNOSIS — N2581 Secondary hyperparathyroidism of renal origin: Secondary | ICD-10-CM | POA: Diagnosis not present

## 2024-01-31 DIAGNOSIS — D509 Iron deficiency anemia, unspecified: Secondary | ICD-10-CM | POA: Diagnosis not present

## 2024-02-02 DIAGNOSIS — E8770 Fluid overload, unspecified: Secondary | ICD-10-CM | POA: Diagnosis not present

## 2024-02-02 DIAGNOSIS — D509 Iron deficiency anemia, unspecified: Secondary | ICD-10-CM | POA: Diagnosis not present

## 2024-02-02 DIAGNOSIS — D631 Anemia in chronic kidney disease: Secondary | ICD-10-CM | POA: Diagnosis not present

## 2024-02-02 DIAGNOSIS — D689 Coagulation defect, unspecified: Secondary | ICD-10-CM | POA: Diagnosis not present

## 2024-02-02 DIAGNOSIS — Z992 Dependence on renal dialysis: Secondary | ICD-10-CM | POA: Diagnosis not present

## 2024-02-02 DIAGNOSIS — N186 End stage renal disease: Secondary | ICD-10-CM | POA: Diagnosis not present

## 2024-02-02 DIAGNOSIS — E1129 Type 2 diabetes mellitus with other diabetic kidney complication: Secondary | ICD-10-CM | POA: Diagnosis not present

## 2024-02-02 DIAGNOSIS — N2581 Secondary hyperparathyroidism of renal origin: Secondary | ICD-10-CM | POA: Diagnosis not present

## 2024-02-04 DIAGNOSIS — D689 Coagulation defect, unspecified: Secondary | ICD-10-CM | POA: Diagnosis not present

## 2024-02-04 DIAGNOSIS — Z992 Dependence on renal dialysis: Secondary | ICD-10-CM | POA: Diagnosis not present

## 2024-02-04 DIAGNOSIS — E8770 Fluid overload, unspecified: Secondary | ICD-10-CM | POA: Diagnosis not present

## 2024-02-04 DIAGNOSIS — D631 Anemia in chronic kidney disease: Secondary | ICD-10-CM | POA: Diagnosis not present

## 2024-02-04 DIAGNOSIS — D509 Iron deficiency anemia, unspecified: Secondary | ICD-10-CM | POA: Diagnosis not present

## 2024-02-04 DIAGNOSIS — N186 End stage renal disease: Secondary | ICD-10-CM | POA: Diagnosis not present

## 2024-02-04 DIAGNOSIS — N2581 Secondary hyperparathyroidism of renal origin: Secondary | ICD-10-CM | POA: Diagnosis not present

## 2024-02-04 DIAGNOSIS — E1129 Type 2 diabetes mellitus with other diabetic kidney complication: Secondary | ICD-10-CM | POA: Diagnosis not present

## 2024-02-06 ENCOUNTER — Ambulatory Visit (INDEPENDENT_AMBULATORY_CARE_PROVIDER_SITE_OTHER): Admitting: Podiatry

## 2024-02-06 ENCOUNTER — Encounter: Payer: Self-pay | Admitting: Podiatry

## 2024-02-06 DIAGNOSIS — N186 End stage renal disease: Secondary | ICD-10-CM

## 2024-02-06 DIAGNOSIS — B351 Tinea unguium: Secondary | ICD-10-CM

## 2024-02-06 DIAGNOSIS — E1142 Type 2 diabetes mellitus with diabetic polyneuropathy: Secondary | ICD-10-CM

## 2024-02-06 DIAGNOSIS — M79676 Pain in unspecified toe(s): Secondary | ICD-10-CM | POA: Diagnosis not present

## 2024-02-06 DIAGNOSIS — Q828 Other specified congenital malformations of skin: Secondary | ICD-10-CM

## 2024-02-06 NOTE — Progress Notes (Signed)
This patient returns to my office for at risk foot care.  This patient requires this care by a professional since this patient will be at risk due to having  ESRD, CKD and diabetes.  This patient is unable to cut nails himself since the patient cannot reach his nails.These nails are painful walking and wearing shoes.  This patient presents for at risk foot care today.  General Appearance  Alert, conversant and in no acute stress.  Vascular  Dorsalis pedis and posterior tibial  pulses are palpable  bilaterally.  Capillary return is within normal limits  bilaterally. Temperature is within normal limits  bilaterally.  Neurologic  Senn-Weinstein monofilament wire test diminished   bilaterally. Muscle power within normal limits bilaterally.  Nails Thick disfigured discolored nails with subungual debris  from hallux to fifth toes bilaterally. No evidence of bacterial infection or drainage bilaterally.  Orthopedic  No limitations of motion  feet .  No crepitus or effusions noted.  No bony pathology or digital deformities noted.  Skin  normotropic skin  noted bilaterally.  No signs of infections or ulcers noted.   Porokeratosis sub 4  B/L.   symptomatic.  Onychomycosis  Pain in right toes  Pain in left toes  Porokeratosis sub 4 left foot.  Consent was obtained for treatment procedures.   Mechanical debridement of nails 1-5  bilaterally performed with a nail nipper.  Filed with dremel without incident.  Debride porokeratosis with # 15 blade and dremel tool.   Return office visit   9 weeks                  Told patient to return for periodic foot care and evaluation due to potential at risk complications.   Gardiner Barefoot DPM

## 2024-02-07 DIAGNOSIS — E1129 Type 2 diabetes mellitus with other diabetic kidney complication: Secondary | ICD-10-CM | POA: Diagnosis not present

## 2024-02-07 DIAGNOSIS — E8770 Fluid overload, unspecified: Secondary | ICD-10-CM | POA: Diagnosis not present

## 2024-02-07 DIAGNOSIS — D689 Coagulation defect, unspecified: Secondary | ICD-10-CM | POA: Diagnosis not present

## 2024-02-07 DIAGNOSIS — D509 Iron deficiency anemia, unspecified: Secondary | ICD-10-CM | POA: Diagnosis not present

## 2024-02-07 DIAGNOSIS — D631 Anemia in chronic kidney disease: Secondary | ICD-10-CM | POA: Diagnosis not present

## 2024-02-07 DIAGNOSIS — Z992 Dependence on renal dialysis: Secondary | ICD-10-CM | POA: Diagnosis not present

## 2024-02-07 DIAGNOSIS — N2581 Secondary hyperparathyroidism of renal origin: Secondary | ICD-10-CM | POA: Diagnosis not present

## 2024-02-07 DIAGNOSIS — N186 End stage renal disease: Secondary | ICD-10-CM | POA: Diagnosis not present

## 2024-02-08 DIAGNOSIS — N2581 Secondary hyperparathyroidism of renal origin: Secondary | ICD-10-CM | POA: Diagnosis not present

## 2024-02-08 DIAGNOSIS — E1129 Type 2 diabetes mellitus with other diabetic kidney complication: Secondary | ICD-10-CM | POA: Diagnosis not present

## 2024-02-08 DIAGNOSIS — D509 Iron deficiency anemia, unspecified: Secondary | ICD-10-CM | POA: Diagnosis not present

## 2024-02-08 DIAGNOSIS — D689 Coagulation defect, unspecified: Secondary | ICD-10-CM | POA: Diagnosis not present

## 2024-02-08 DIAGNOSIS — N186 End stage renal disease: Secondary | ICD-10-CM | POA: Diagnosis not present

## 2024-02-08 DIAGNOSIS — Z992 Dependence on renal dialysis: Secondary | ICD-10-CM | POA: Diagnosis not present

## 2024-02-08 DIAGNOSIS — E8770 Fluid overload, unspecified: Secondary | ICD-10-CM | POA: Insufficient documentation

## 2024-02-08 DIAGNOSIS — D631 Anemia in chronic kidney disease: Secondary | ICD-10-CM | POA: Diagnosis not present

## 2024-02-09 DIAGNOSIS — D689 Coagulation defect, unspecified: Secondary | ICD-10-CM | POA: Diagnosis not present

## 2024-02-09 DIAGNOSIS — Z992 Dependence on renal dialysis: Secondary | ICD-10-CM | POA: Diagnosis not present

## 2024-02-09 DIAGNOSIS — E1129 Type 2 diabetes mellitus with other diabetic kidney complication: Secondary | ICD-10-CM | POA: Diagnosis not present

## 2024-02-09 DIAGNOSIS — N186 End stage renal disease: Secondary | ICD-10-CM | POA: Diagnosis not present

## 2024-02-09 DIAGNOSIS — D631 Anemia in chronic kidney disease: Secondary | ICD-10-CM | POA: Diagnosis not present

## 2024-02-09 DIAGNOSIS — E8770 Fluid overload, unspecified: Secondary | ICD-10-CM | POA: Diagnosis not present

## 2024-02-09 DIAGNOSIS — D509 Iron deficiency anemia, unspecified: Secondary | ICD-10-CM | POA: Diagnosis not present

## 2024-02-09 DIAGNOSIS — N2581 Secondary hyperparathyroidism of renal origin: Secondary | ICD-10-CM | POA: Diagnosis not present

## 2024-02-09 DIAGNOSIS — E1122 Type 2 diabetes mellitus with diabetic chronic kidney disease: Secondary | ICD-10-CM | POA: Diagnosis not present

## 2024-02-11 DIAGNOSIS — E1129 Type 2 diabetes mellitus with other diabetic kidney complication: Secondary | ICD-10-CM | POA: Diagnosis not present

## 2024-02-11 DIAGNOSIS — D631 Anemia in chronic kidney disease: Secondary | ICD-10-CM | POA: Diagnosis not present

## 2024-02-11 DIAGNOSIS — N186 End stage renal disease: Secondary | ICD-10-CM | POA: Diagnosis not present

## 2024-02-11 DIAGNOSIS — D689 Coagulation defect, unspecified: Secondary | ICD-10-CM | POA: Diagnosis not present

## 2024-02-11 DIAGNOSIS — N2581 Secondary hyperparathyroidism of renal origin: Secondary | ICD-10-CM | POA: Diagnosis not present

## 2024-02-11 DIAGNOSIS — D509 Iron deficiency anemia, unspecified: Secondary | ICD-10-CM | POA: Diagnosis not present

## 2024-02-11 DIAGNOSIS — Z992 Dependence on renal dialysis: Secondary | ICD-10-CM | POA: Diagnosis not present

## 2024-02-14 DIAGNOSIS — D509 Iron deficiency anemia, unspecified: Secondary | ICD-10-CM | POA: Diagnosis not present

## 2024-02-16 DIAGNOSIS — D631 Anemia in chronic kidney disease: Secondary | ICD-10-CM | POA: Diagnosis not present

## 2024-02-18 DIAGNOSIS — N186 End stage renal disease: Secondary | ICD-10-CM | POA: Diagnosis not present

## 2024-02-20 DIAGNOSIS — D509 Iron deficiency anemia, unspecified: Secondary | ICD-10-CM | POA: Diagnosis not present

## 2024-02-20 DIAGNOSIS — N2581 Secondary hyperparathyroidism of renal origin: Secondary | ICD-10-CM | POA: Diagnosis not present

## 2024-02-20 DIAGNOSIS — D689 Coagulation defect, unspecified: Secondary | ICD-10-CM | POA: Diagnosis not present

## 2024-02-20 DIAGNOSIS — Z992 Dependence on renal dialysis: Secondary | ICD-10-CM | POA: Diagnosis not present

## 2024-02-20 DIAGNOSIS — N186 End stage renal disease: Secondary | ICD-10-CM | POA: Diagnosis not present

## 2024-02-20 DIAGNOSIS — D631 Anemia in chronic kidney disease: Secondary | ICD-10-CM | POA: Diagnosis not present

## 2024-02-21 ENCOUNTER — Other Ambulatory Visit: Payer: Self-pay | Admitting: Internal Medicine

## 2024-02-21 DIAGNOSIS — E1129 Type 2 diabetes mellitus with other diabetic kidney complication: Secondary | ICD-10-CM | POA: Diagnosis not present

## 2024-02-21 DIAGNOSIS — N186 End stage renal disease: Secondary | ICD-10-CM | POA: Diagnosis not present

## 2024-02-21 DIAGNOSIS — D689 Coagulation defect, unspecified: Secondary | ICD-10-CM | POA: Diagnosis not present

## 2024-02-21 DIAGNOSIS — D631 Anemia in chronic kidney disease: Secondary | ICD-10-CM | POA: Diagnosis not present

## 2024-02-21 DIAGNOSIS — N2581 Secondary hyperparathyroidism of renal origin: Secondary | ICD-10-CM | POA: Diagnosis not present

## 2024-02-21 DIAGNOSIS — Z992 Dependence on renal dialysis: Secondary | ICD-10-CM | POA: Diagnosis not present

## 2024-02-21 NOTE — Telephone Encounter (Signed)
 Copied from CRM [id number]. Topic: Clinical - Medication Refill >> Feb 21, 2024  2:15 PM Mesmerise C wrote: Medication:  Dulaglutide  (TRULICITY ) 1.5 MG/0.5ML SOAJ    Has the patient contacted their pharmacy? Yes (Agent: If no, request that the patient contact the pharmacy for the refill. If patient does not wish to contact the pharmacy document the reason why and proceed with request.) (Agent: If yes, when and what did the pharmacy advise?)  This is the patient's preferred pharmacy:   [facility] [address] [place], [place] - [address] [place] [address] Phone: [phone] Fax: [phone]   Is this the correct pharmacy for this prescription? Yes If no, delete pharmacy and type the correct one.   Has the prescription been filled recently? No  Is the patient out of the medication? No  Has the patient been seen for an appointment in the last year OR does the patient have an upcoming appointment? Yes  Can we respond through MyChart? No  Agent: Please be advised that Rx refills may take up to 3 business days. We ask that you follow-up with your pharmacy.

## 2024-02-22 MED ORDER — TRULICITY 1.5 MG/0.5ML ~~LOC~~ SOAJ: 1.5000 mg | SUBCUTANEOUS | 3 refills | Status: DC

## 2024-02-23 DIAGNOSIS — E1129 Type 2 diabetes mellitus with other diabetic kidney complication: Secondary | ICD-10-CM | POA: Diagnosis not present

## 2024-02-25 DIAGNOSIS — N2581 Secondary hyperparathyroidism of renal origin: Secondary | ICD-10-CM | POA: Diagnosis not present

## 2024-02-28 DIAGNOSIS — N186 End stage renal disease: Secondary | ICD-10-CM | POA: Diagnosis not present

## 2024-02-28 DIAGNOSIS — Z992 Dependence on renal dialysis: Secondary | ICD-10-CM | POA: Diagnosis not present

## 2024-02-28 DIAGNOSIS — E1129 Type 2 diabetes mellitus with other diabetic kidney complication: Secondary | ICD-10-CM | POA: Diagnosis not present

## 2024-02-28 DIAGNOSIS — D631 Anemia in chronic kidney disease: Secondary | ICD-10-CM | POA: Diagnosis not present

## 2024-02-28 DIAGNOSIS — D689 Coagulation defect, unspecified: Secondary | ICD-10-CM | POA: Diagnosis not present

## 2024-02-28 DIAGNOSIS — N2581 Secondary hyperparathyroidism of renal origin: Secondary | ICD-10-CM | POA: Diagnosis not present

## 2024-02-28 DIAGNOSIS — D509 Iron deficiency anemia, unspecified: Secondary | ICD-10-CM | POA: Diagnosis not present

## 2024-03-01 DIAGNOSIS — Z992 Dependence on renal dialysis: Secondary | ICD-10-CM | POA: Diagnosis not present

## 2024-03-06 NOTE — Telephone Encounter (Unsigned)
 Copied from CRM #[id number]. Topic: Clinical - Medication Question >> Mar 06, 2024 11:12 AM Terri G wrote: Reason for CRM: Patient's mom Rockie called to see where his prescription was and I advised her that the medication  Dulaglutide  (TRULICITY ) 1.5 MG/0.5ML SOAJ was discontinued. Ms.Shirly wants someone to call her to let her know why it was. Callback number [phone]

## 2024-03-08 DIAGNOSIS — N186 End stage renal disease: Secondary | ICD-10-CM | POA: Diagnosis not present

## 2024-03-09 ENCOUNTER — Other Ambulatory Visit: Payer: Self-pay

## 2024-03-09 MED ORDER — TRULICITY 1.5 MG/0.5ML ~~LOC~~ SOAJ: 1.5000 mg | SUBCUTANEOUS | 3 refills | Status: AC

## 2024-03-09 NOTE — Telephone Encounter (Unsigned)
 Copied from CRM [id number]. Topic: Clinical - Prescription Issue >> Mar 09, 2024  9:47 AM Charolett L wrote: Reason for CRM: Patient mother called in and stated that the medication Dulaglutide  (TRULICITY ) 1.5 MG/0.5ML SOAJ needs to be substituted because its discontinued and hes needs the sub asap because he only has 1 day left for this medication. Mother needs a call back

## 2024-03-09 NOTE — Telephone Encounter (Signed)
 Called and spoke with patient's mom. Informed her that we had put a prescription into one of the mail order pharmacies on 08/13. They informed me this had not yet been received. They advised me to send it to [place] instead. Informed them to reach out to the office if med is not received within next week

## 2024-03-10 DIAGNOSIS — N186 End stage renal disease: Secondary | ICD-10-CM | POA: Diagnosis not present

## 2024-03-10 DIAGNOSIS — E1129 Type 2 diabetes mellitus with other diabetic kidney complication: Secondary | ICD-10-CM | POA: Diagnosis not present

## 2024-03-10 DIAGNOSIS — D509 Iron deficiency anemia, unspecified: Secondary | ICD-10-CM | POA: Diagnosis not present

## 2024-03-10 DIAGNOSIS — N2581 Secondary hyperparathyroidism of renal origin: Secondary | ICD-10-CM | POA: Diagnosis not present

## 2024-03-10 DIAGNOSIS — D689 Coagulation defect, unspecified: Secondary | ICD-10-CM | POA: Diagnosis not present

## 2024-03-10 DIAGNOSIS — Z992 Dependence on renal dialysis: Secondary | ICD-10-CM | POA: Diagnosis not present

## 2024-03-10 DIAGNOSIS — D631 Anemia in chronic kidney disease: Secondary | ICD-10-CM | POA: Diagnosis not present

## 2024-03-11 DIAGNOSIS — Z992 Dependence on renal dialysis: Secondary | ICD-10-CM | POA: Diagnosis not present

## 2024-03-11 DIAGNOSIS — N186 End stage renal disease: Secondary | ICD-10-CM | POA: Diagnosis not present

## 2024-03-11 DIAGNOSIS — E1122 Type 2 diabetes mellitus with diabetic chronic kidney disease: Secondary | ICD-10-CM | POA: Diagnosis not present

## 2024-03-13 DIAGNOSIS — E1129 Type 2 diabetes mellitus with other diabetic kidney complication: Secondary | ICD-10-CM | POA: Diagnosis not present

## 2024-03-13 DIAGNOSIS — D689 Coagulation defect, unspecified: Secondary | ICD-10-CM | POA: Diagnosis not present

## 2024-03-13 DIAGNOSIS — N186 End stage renal disease: Secondary | ICD-10-CM | POA: Diagnosis not present

## 2024-03-13 DIAGNOSIS — N2581 Secondary hyperparathyroidism of renal origin: Secondary | ICD-10-CM | POA: Diagnosis not present

## 2024-03-13 DIAGNOSIS — Z992 Dependence on renal dialysis: Secondary | ICD-10-CM | POA: Diagnosis not present

## 2024-03-13 DIAGNOSIS — D509 Iron deficiency anemia, unspecified: Secondary | ICD-10-CM | POA: Diagnosis not present

## 2024-03-15 DIAGNOSIS — E1129 Type 2 diabetes mellitus with other diabetic kidney complication: Secondary | ICD-10-CM | POA: Diagnosis not present

## 2024-03-15 DIAGNOSIS — D689 Coagulation defect, unspecified: Secondary | ICD-10-CM | POA: Diagnosis not present

## 2024-03-15 DIAGNOSIS — D509 Iron deficiency anemia, unspecified: Secondary | ICD-10-CM | POA: Diagnosis not present

## 2024-03-15 DIAGNOSIS — N186 End stage renal disease: Secondary | ICD-10-CM | POA: Diagnosis not present

## 2024-03-15 DIAGNOSIS — Z992 Dependence on renal dialysis: Secondary | ICD-10-CM | POA: Diagnosis not present

## 2024-03-15 DIAGNOSIS — N2581 Secondary hyperparathyroidism of renal origin: Secondary | ICD-10-CM | POA: Diagnosis not present

## 2024-03-17 DIAGNOSIS — N186 End stage renal disease: Secondary | ICD-10-CM | POA: Diagnosis not present

## 2024-03-17 DIAGNOSIS — D509 Iron deficiency anemia, unspecified: Secondary | ICD-10-CM | POA: Diagnosis not present

## 2024-03-17 DIAGNOSIS — E1129 Type 2 diabetes mellitus with other diabetic kidney complication: Secondary | ICD-10-CM | POA: Diagnosis not present

## 2024-03-17 DIAGNOSIS — Z992 Dependence on renal dialysis: Secondary | ICD-10-CM | POA: Diagnosis not present

## 2024-03-17 DIAGNOSIS — N2581 Secondary hyperparathyroidism of renal origin: Secondary | ICD-10-CM | POA: Diagnosis not present

## 2024-03-17 DIAGNOSIS — D689 Coagulation defect, unspecified: Secondary | ICD-10-CM | POA: Diagnosis not present

## 2024-03-20 DIAGNOSIS — D689 Coagulation defect, unspecified: Secondary | ICD-10-CM | POA: Diagnosis not present

## 2024-03-20 DIAGNOSIS — D509 Iron deficiency anemia, unspecified: Secondary | ICD-10-CM | POA: Diagnosis not present

## 2024-03-20 DIAGNOSIS — N186 End stage renal disease: Secondary | ICD-10-CM | POA: Diagnosis not present

## 2024-03-20 DIAGNOSIS — Z992 Dependence on renal dialysis: Secondary | ICD-10-CM | POA: Diagnosis not present

## 2024-03-20 DIAGNOSIS — N2581 Secondary hyperparathyroidism of renal origin: Secondary | ICD-10-CM | POA: Diagnosis not present

## 2024-03-20 DIAGNOSIS — E1129 Type 2 diabetes mellitus with other diabetic kidney complication: Secondary | ICD-10-CM | POA: Diagnosis not present

## 2024-03-22 DIAGNOSIS — D509 Iron deficiency anemia, unspecified: Secondary | ICD-10-CM | POA: Diagnosis not present

## 2024-03-22 DIAGNOSIS — D689 Coagulation defect, unspecified: Secondary | ICD-10-CM | POA: Diagnosis not present

## 2024-03-22 DIAGNOSIS — Z992 Dependence on renal dialysis: Secondary | ICD-10-CM | POA: Diagnosis not present

## 2024-03-22 DIAGNOSIS — E1129 Type 2 diabetes mellitus with other diabetic kidney complication: Secondary | ICD-10-CM | POA: Diagnosis not present

## 2024-03-22 DIAGNOSIS — N186 End stage renal disease: Secondary | ICD-10-CM | POA: Diagnosis not present

## 2024-03-22 DIAGNOSIS — N2581 Secondary hyperparathyroidism of renal origin: Secondary | ICD-10-CM | POA: Diagnosis not present

## 2024-03-24 DIAGNOSIS — E1129 Type 2 diabetes mellitus with other diabetic kidney complication: Secondary | ICD-10-CM | POA: Diagnosis not present

## 2024-03-24 DIAGNOSIS — N186 End stage renal disease: Secondary | ICD-10-CM | POA: Diagnosis not present

## 2024-03-24 DIAGNOSIS — N2581 Secondary hyperparathyroidism of renal origin: Secondary | ICD-10-CM | POA: Diagnosis not present

## 2024-03-24 DIAGNOSIS — Z992 Dependence on renal dialysis: Secondary | ICD-10-CM | POA: Diagnosis not present

## 2024-03-24 DIAGNOSIS — D509 Iron deficiency anemia, unspecified: Secondary | ICD-10-CM | POA: Diagnosis not present

## 2024-03-24 DIAGNOSIS — D689 Coagulation defect, unspecified: Secondary | ICD-10-CM | POA: Diagnosis not present

## 2024-03-27 DIAGNOSIS — D509 Iron deficiency anemia, unspecified: Secondary | ICD-10-CM | POA: Diagnosis not present

## 2024-03-27 DIAGNOSIS — D689 Coagulation defect, unspecified: Secondary | ICD-10-CM | POA: Diagnosis not present

## 2024-03-27 DIAGNOSIS — E1129 Type 2 diabetes mellitus with other diabetic kidney complication: Secondary | ICD-10-CM | POA: Diagnosis not present

## 2024-03-27 DIAGNOSIS — Z992 Dependence on renal dialysis: Secondary | ICD-10-CM | POA: Diagnosis not present

## 2024-03-27 DIAGNOSIS — N186 End stage renal disease: Secondary | ICD-10-CM | POA: Diagnosis not present

## 2024-03-27 DIAGNOSIS — N2581 Secondary hyperparathyroidism of renal origin: Secondary | ICD-10-CM | POA: Diagnosis not present

## 2024-03-29 DIAGNOSIS — D689 Coagulation defect, unspecified: Secondary | ICD-10-CM | POA: Diagnosis not present

## 2024-03-29 DIAGNOSIS — E1129 Type 2 diabetes mellitus with other diabetic kidney complication: Secondary | ICD-10-CM | POA: Diagnosis not present

## 2024-03-29 DIAGNOSIS — D509 Iron deficiency anemia, unspecified: Secondary | ICD-10-CM | POA: Diagnosis not present

## 2024-03-29 DIAGNOSIS — N2581 Secondary hyperparathyroidism of renal origin: Secondary | ICD-10-CM | POA: Diagnosis not present

## 2024-03-29 DIAGNOSIS — Z992 Dependence on renal dialysis: Secondary | ICD-10-CM | POA: Diagnosis not present

## 2024-03-29 DIAGNOSIS — N186 End stage renal disease: Secondary | ICD-10-CM | POA: Diagnosis not present

## 2024-03-31 DIAGNOSIS — Z992 Dependence on renal dialysis: Secondary | ICD-10-CM | POA: Diagnosis not present

## 2024-03-31 DIAGNOSIS — N2581 Secondary hyperparathyroidism of renal origin: Secondary | ICD-10-CM | POA: Diagnosis not present

## 2024-03-31 DIAGNOSIS — N186 End stage renal disease: Secondary | ICD-10-CM | POA: Diagnosis not present

## 2024-03-31 DIAGNOSIS — D509 Iron deficiency anemia, unspecified: Secondary | ICD-10-CM | POA: Diagnosis not present

## 2024-03-31 DIAGNOSIS — D689 Coagulation defect, unspecified: Secondary | ICD-10-CM | POA: Diagnosis not present

## 2024-03-31 DIAGNOSIS — E1129 Type 2 diabetes mellitus with other diabetic kidney complication: Secondary | ICD-10-CM | POA: Diagnosis not present

## 2024-04-03 DIAGNOSIS — N2581 Secondary hyperparathyroidism of renal origin: Secondary | ICD-10-CM | POA: Diagnosis not present

## 2024-04-03 DIAGNOSIS — E1129 Type 2 diabetes mellitus with other diabetic kidney complication: Secondary | ICD-10-CM | POA: Diagnosis not present

## 2024-04-03 DIAGNOSIS — N186 End stage renal disease: Secondary | ICD-10-CM | POA: Diagnosis not present

## 2024-04-03 DIAGNOSIS — Z992 Dependence on renal dialysis: Secondary | ICD-10-CM | POA: Diagnosis not present

## 2024-04-03 DIAGNOSIS — D509 Iron deficiency anemia, unspecified: Secondary | ICD-10-CM | POA: Diagnosis not present

## 2024-04-03 DIAGNOSIS — D689 Coagulation defect, unspecified: Secondary | ICD-10-CM | POA: Diagnosis not present

## 2024-04-05 DIAGNOSIS — E1129 Type 2 diabetes mellitus with other diabetic kidney complication: Secondary | ICD-10-CM | POA: Diagnosis not present

## 2024-04-05 DIAGNOSIS — N186 End stage renal disease: Secondary | ICD-10-CM | POA: Diagnosis not present

## 2024-04-05 DIAGNOSIS — Z992 Dependence on renal dialysis: Secondary | ICD-10-CM | POA: Diagnosis not present

## 2024-04-05 DIAGNOSIS — D509 Iron deficiency anemia, unspecified: Secondary | ICD-10-CM | POA: Diagnosis not present

## 2024-04-05 DIAGNOSIS — D689 Coagulation defect, unspecified: Secondary | ICD-10-CM | POA: Diagnosis not present

## 2024-04-05 DIAGNOSIS — N2581 Secondary hyperparathyroidism of renal origin: Secondary | ICD-10-CM | POA: Diagnosis not present

## 2024-04-07 DIAGNOSIS — N2581 Secondary hyperparathyroidism of renal origin: Secondary | ICD-10-CM | POA: Diagnosis not present

## 2024-04-07 DIAGNOSIS — Z992 Dependence on renal dialysis: Secondary | ICD-10-CM | POA: Diagnosis not present

## 2024-04-07 DIAGNOSIS — E1129 Type 2 diabetes mellitus with other diabetic kidney complication: Secondary | ICD-10-CM | POA: Diagnosis not present

## 2024-04-07 DIAGNOSIS — D509 Iron deficiency anemia, unspecified: Secondary | ICD-10-CM | POA: Diagnosis not present

## 2024-04-07 DIAGNOSIS — N186 End stage renal disease: Secondary | ICD-10-CM | POA: Diagnosis not present

## 2024-04-07 DIAGNOSIS — D689 Coagulation defect, unspecified: Secondary | ICD-10-CM | POA: Diagnosis not present

## 2024-04-10 DIAGNOSIS — E1129 Type 2 diabetes mellitus with other diabetic kidney complication: Secondary | ICD-10-CM | POA: Diagnosis not present

## 2024-04-10 DIAGNOSIS — D689 Coagulation defect, unspecified: Secondary | ICD-10-CM | POA: Diagnosis not present

## 2024-04-10 DIAGNOSIS — D509 Iron deficiency anemia, unspecified: Secondary | ICD-10-CM | POA: Diagnosis not present

## 2024-04-10 DIAGNOSIS — N186 End stage renal disease: Secondary | ICD-10-CM | POA: Diagnosis not present

## 2024-04-10 DIAGNOSIS — Z992 Dependence on renal dialysis: Secondary | ICD-10-CM | POA: Diagnosis not present

## 2024-04-10 DIAGNOSIS — E1122 Type 2 diabetes mellitus with diabetic chronic kidney disease: Secondary | ICD-10-CM | POA: Diagnosis not present

## 2024-04-10 DIAGNOSIS — N2581 Secondary hyperparathyroidism of renal origin: Secondary | ICD-10-CM | POA: Diagnosis not present

## 2024-04-11 ENCOUNTER — Encounter: Payer: Self-pay | Admitting: Podiatry

## 2024-04-11 ENCOUNTER — Ambulatory Visit (INDEPENDENT_AMBULATORY_CARE_PROVIDER_SITE_OTHER): Admitting: Podiatry

## 2024-04-11 ENCOUNTER — Encounter: Payer: Self-pay | Admitting: Vascular Surgery

## 2024-04-11 ENCOUNTER — Ambulatory Visit: Admitting: Podiatry

## 2024-04-11 ENCOUNTER — Encounter: Payer: Self-pay | Admitting: *Deleted

## 2024-04-11 ENCOUNTER — Other Ambulatory Visit: Payer: Self-pay | Admitting: *Deleted

## 2024-04-11 ENCOUNTER — Ambulatory Visit: Attending: Vascular Surgery | Admitting: Vascular Surgery

## 2024-04-11 VITALS — BP 130/71 | Ht 72.0 in | Wt 163.0 lb

## 2024-04-11 DIAGNOSIS — E1142 Type 2 diabetes mellitus with diabetic polyneuropathy: Secondary | ICD-10-CM

## 2024-04-11 DIAGNOSIS — N186 End stage renal disease: Secondary | ICD-10-CM

## 2024-04-11 DIAGNOSIS — T82590A Other mechanical complication of surgically created arteriovenous fistula, initial encounter: Secondary | ICD-10-CM

## 2024-04-11 DIAGNOSIS — Q828 Other specified congenital malformations of skin: Secondary | ICD-10-CM

## 2024-04-11 DIAGNOSIS — M79676 Pain in unspecified toe(s): Secondary | ICD-10-CM | POA: Diagnosis not present

## 2024-04-11 DIAGNOSIS — B351 Tinea unguium: Secondary | ICD-10-CM | POA: Diagnosis not present

## 2024-04-11 NOTE — H&P (View-Only) (Signed)
 Patient ID: Christopher Lopez, male   DOB: 15-Nov-1960, 63 y.o.   MRN: [id number]  Reason for Consult: Follow-up   Referred by Norleen Lynwood ORN, MD  Subjective:     HPI:  Christopher Lopez is a 63 y.o. male history of end-stage renal disease on dialysis via left upper extremity AV graft.  This was most recently revised in July 2024.  He has had some increased bleeding at completion of dialysis now with concern for ulceration.  He states the ulcer has been there for several weeks at least.  No fevers or chills.  Past Medical History:  Diagnosis Date   Alcohol abuse 09/18/2011   Allergic rhinitis, cause unspecified 09/20/2011   Anemia, unspecified 09/18/2011   Arthritis    Childhood asthma 09/20/2011   CKD (chronic kidney disease) stage 5, GFR less than 15 ml/min (HCC)    Dialysis - T/Th/Sa   Colitis 09/18/2011   Dementia (HCC) 09/18/2011   Diabetes mellitus    Diverticulosis of colon without hemorrhage 02/27/2014   Foot ulcer (HCC) 09/18/2011   High cholesterol    HTN (hypertension) 09/18/2011   Hyperlipidemia 11/08/2011   Hypertension    Impaired glucose tolerance 09/18/2011   Pneumonia 09/18/2011   Pre-ulcerative corn or callous 11/20/2011   Stroke (HCC)    left leg deficit   Type II or unspecified type diabetes mellitus without mention of complication, uncontrolled 09/20/2011   Wears glasses    Family History  Problem Relation Age of Onset   Prostate cancer Paternal Grandfather    Diabetes Mother    Heart disease Mother    Transient ischemic attack Mother    Diabetes Sister    Diabetes Maternal Grandfather    Colon cancer Neg Hx    Past Surgical History:  Procedure Laterality Date   A/V FISTULAGRAM Left 12/13/2022   Procedure: A/V Fistulagram;  Surgeon: Sheree Penne Bruckner, MD;  Location: [facility] INVASIVE CV LAB;  Service: Cardiovascular;  Laterality: Left;   AV FISTULA PLACEMENT Left 12/23/2017   Procedure: CREATION OF BASILIAC - CEPHALIC  ARTERIOVENOUS (AV) FISTULA   STAGE ONE;  Surgeon: Sheree Penne Bruckner, MD;  Location: [facility] OR;  Service: Vascular;  Laterality: Left;   AV FISTULA PLACEMENT Left 07/26/2018   Procedure: INSERTION OF ARTERIOVENOUS (AV) GORE-TEX GRAFT ARM;  Surgeon: Sheree Penne Bruckner, MD;  Location: [facility] OR;  Service: Vascular;  Laterality: Left;   BASCILIC VEIN TRANSPOSITION Left 03/01/2018   Procedure: BASILIC VEIN TRANSPOSITION SECOND STAGE;  Surgeon: Sheree Penne Bruckner, MD;  Location: [facility] OR;  Service: Vascular;  Laterality: Left;   INSERTION OF DIALYSIS CATHETER Right 12/23/2017   Procedure: INSERTION OF PALINDROME  DIALYSIS CATHETER;  Surgeon: Sheree Penne Bruckner, MD;  Location: [facility] OR;  Service: Vascular;  Laterality: Right;   INSERTION OF DIALYSIS CATHETER Left 02/04/2023   Procedure: INSERTION OF TUNNELED DIALYSIS CATHETER;  Surgeon: Sheree Penne Bruckner, MD;  Location: [facility] OR;  Service: Vascular;  Laterality: Left;   MASS EXCISION  02/04/2023   Procedure: EXCISION PSEUDOANEURYSM LEFT ARM;  Surgeon: Sheree Penne Bruckner, MD;  Location: [facility] OR;  Service: Vascular;;   PERIPHERAL VASCULAR BALLOON ANGIOPLASTY  12/13/2022   Procedure: PERIPHERAL VASCULAR BALLOON ANGIOPLASTY;  Surgeon: Sheree Penne Bruckner, MD;  Location: [facility] INVASIVE CV LAB;  Service: Cardiovascular;;   REVISION OF ARTERIOVENOUS GORETEX GRAFT Left 02/04/2023   Procedure: REVISION OF LEFT ARM ARTERIOVENOUS GORETEX GRAFT USING 7mm GORETEX STRETCH GRAFT;  Surgeon: Sheree Penne Bruckner, MD;  Location: [facility] OR;  Service: Vascular;  Laterality: Left;   THROMBECTOMY BRACHIAL ARTERY Left 07/26/2018   Procedure: THROMBECTOMY LEFT UPPER EXTREMITY;  Surgeon: Sheree Penne Bruckner, MD;  Location: [facility] OR;  Service: Vascular;  Laterality: Left;   TONSILLECTOMY  1985   VEIN REPAIR Left 07/26/2018   Procedure: STENT LEFT AXILLARY VEIN;  Surgeon: Sheree Penne Bruckner, MD;  Location: [facility] OR;  Service: Vascular;  Laterality: Left;    Short Social History:   Social History   Tobacco Use   Smoking status: Every Day    Current packs/day: 0.25    Average packs/day: 0.3 packs/day for 52.7 years (13.2 ttl pk-yrs)    Types: Cigarettes    Start date: 07/13/1971    Passive exposure: Current   Smokeless tobacco: Never   Tobacco comments:    Pt declines to quit as of 10/14/2023.  Substance Use Topics   Alcohol use: Not Currently    Alcohol/week: 2.0 standard drinks of alcohol    Types: 2 Cans of beer per week    Comment: quit years ago    Allergies  Allergen Reactions   Lipitor [Atorvastatin] Other (See Comments)    pain    Current Outpatient Medications  Medication Sig Dispense Refill   acetaminophen  (TYLENOL ) 650 MG CR tablet Take 1,300 mg by mouth every 8 (eight) hours as needed for pain.     amLODipine  (NORVASC ) 2.5 MG tablet Take 1 tablet (2.5 mg total) by mouth daily. 90 tablet 3   aspirin  81 MG EC tablet Take 81 mg by mouth daily.     diphenhydrAMINE (BENADRYL) 25 mg capsule Take 75 mg by mouth daily as needed for itching.     Doxercalciferol  (HECTOROL  IV) Doxercalciferol  (Hectorol )     Doxercalciferol  (HECTOROL  IV) 6 mcg.     Dulaglutide  (TRULICITY ) 1.5 MG/0.5ML SOAJ Inject 1.5 mg into the skin once a week. 6 mL 3   glucose blood (ONE TOUCH ULTRA TEST) test strip Use as instructed once daily E11.9 100 each 12   Lancets (ONETOUCH ULTRASOFT) lancets 1 each by Other route 2 (two) times daily. Use to check blood sugars once a day Dx e11.9 100 each 3   losartan (COZAAR) 50 MG tablet Take 50 mg by mouth daily.     Methoxy PEG-Epoetin  Beta (MIRCERA IJ) 75 mcg.     metoprolol  succinate (TOPROL -XL) 50 MG 24 hr tablet TAKE 1 TABLET BY MOUTH EVERY DAY  WITH OR IMMEDIATELY FOLLOWING A MEAL 90 tablet 3   rosuvastatin  (CRESTOR ) 20 MG tablet TAKE 1 TABLET BY MOUTH EVERY DAY 90 tablet 3   SENSIPAR  30 MG tablet Take 30 mg by mouth daily.     VELPHORO 500 MG chewable tablet Chew 1,000 mg by mouth 2 (two) times daily.     No current  facility-administered medications for this visit.    Review of Systems  Constitutional:  Constitutional negative. HENT: HENT negative.  Eyes: Eyes negative.  Respiratory: Respiratory negative.  Cardiovascular: Cardiovascular negative.  GI: Gastrointestinal negative.  Musculoskeletal: Musculoskeletal negative.  Skin: Skin negative.  Neurological: Neurological negative. Hematologic: Positive for bruises/bleeds easily.  Psychiatric: Psychiatric negative.        Objective:  Objective   Vitals:   04/11/24 1506  BP: 130/71  Weight: 163 lb (73.9 kg)  Height: 6' (1.829 m)   Body mass index is 22.11 kg/m.  Physical Exam HENT:     Head: Normocephalic.     Nose: Nose normal.     Mouth/Throat:     Mouth: Mucous membranes are moist.  Cardiovascular:     Rate and Rhythm: Normal rate.  Pulmonary:     Effort: Pulmonary effort is normal.  Neurological:     Mental Status: He is alert.     Data: No new studies     Assessment/Plan:     63 year old male with ulceration of his left upper arm AV graft as noted above.  He also has pulsatility in the graft.  Plan will be for fistulogram with revision of the graft and possible placement of TDC on nondialysis day in the very near future.  We discussed the risk particularly of requiring the graft to be ligated if we cannot salvage which would render him catheter dependent for at least a short time.  He demonstrates good understanding we will get him scheduled in the near future.  He is okay to use the graft tomorrow for dialysis but needs to avoid cannulation near the area of concern.     Penne Lonni Colorado MD [facility]

## 2024-04-11 NOTE — Progress Notes (Signed)
This patient returns to my office for at risk foot care.  This patient requires this care by a professional since this patient will be at risk due to having  ESRD, CKD and diabetes.  This patient is unable to cut nails himself since the patient cannot reach his nails.These nails are painful walking and wearing shoes.  This patient presents for at risk foot care today.  General Appearance  Alert, conversant and in no acute stress.  Vascular  Dorsalis pedis and posterior tibial  pulses are palpable  bilaterally.  Capillary return is within normal limits  bilaterally. Temperature is within normal limits  bilaterally.  Neurologic  Senn-Weinstein monofilament wire test diminished   bilaterally. Muscle power within normal limits bilaterally.  Nails Thick disfigured discolored nails with subungual debris  from hallux to fifth toes bilaterally. No evidence of bacterial infection or drainage bilaterally.  Orthopedic  No limitations of motion  feet .  No crepitus or effusions noted.  No bony pathology or digital deformities noted.  Skin  normotropic skin  noted bilaterally.  No signs of infections or ulcers noted.   Porokeratosis sub 4  B/L.   symptomatic.  Onychomycosis  Pain in right toes  Pain in left toes  Porokeratosis sub 4 left foot.  Consent was obtained for treatment procedures.   Mechanical debridement of nails 1-5  bilaterally performed with a nail nipper.  Filed with dremel without incident.  Debride porokeratosis with # 15 blade and dremel tool.   Return office visit   9 weeks                  Told patient to return for periodic foot care and evaluation due to potential at risk complications.   Gardiner Barefoot DPM

## 2024-04-11 NOTE — Progress Notes (Signed)
 Patient ID: Christopher Lopez, male   DOB: 15-Nov-1960, 63 y.o.   MRN: [id number]  Reason for Consult: Follow-up   Referred by Norleen Lynwood ORN, MD  Subjective:     HPI:  Christopher Lopez is a 63 y.o. male history of end-stage renal disease on dialysis via left upper extremity AV graft.  This was most recently revised in July 2024.  He has had some increased bleeding at completion of dialysis now with concern for ulceration.  He states the ulcer has been there for several weeks at least.  No fevers or chills.  Past Medical History:  Diagnosis Date   Alcohol abuse 09/18/2011   Allergic rhinitis, cause unspecified 09/20/2011   Anemia, unspecified 09/18/2011   Arthritis    Childhood asthma 09/20/2011   CKD (chronic kidney disease) stage 5, GFR less than 15 ml/min (HCC)    Dialysis - T/Th/Sa   Colitis 09/18/2011   Dementia (HCC) 09/18/2011   Diabetes mellitus    Diverticulosis of colon without hemorrhage 02/27/2014   Foot ulcer (HCC) 09/18/2011   High cholesterol    HTN (hypertension) 09/18/2011   Hyperlipidemia 11/08/2011   Hypertension    Impaired glucose tolerance 09/18/2011   Pneumonia 09/18/2011   Pre-ulcerative corn or callous 11/20/2011   Stroke (HCC)    left leg deficit   Type II or unspecified type diabetes mellitus without mention of complication, uncontrolled 09/20/2011   Wears glasses    Family History  Problem Relation Age of Onset   Prostate cancer Paternal Grandfather    Diabetes Mother    Heart disease Mother    Transient ischemic attack Mother    Diabetes Sister    Diabetes Maternal Grandfather    Colon cancer Neg Hx    Past Surgical History:  Procedure Laterality Date   A/V FISTULAGRAM Left 12/13/2022   Procedure: A/V Fistulagram;  Surgeon: Sheree Penne Bruckner, MD;  Location: [facility] INVASIVE CV LAB;  Service: Cardiovascular;  Laterality: Left;   AV FISTULA PLACEMENT Left 12/23/2017   Procedure: CREATION OF BASILIAC - CEPHALIC  ARTERIOVENOUS (AV) FISTULA   STAGE ONE;  Surgeon: Sheree Penne Bruckner, MD;  Location: [facility] OR;  Service: Vascular;  Laterality: Left;   AV FISTULA PLACEMENT Left 07/26/2018   Procedure: INSERTION OF ARTERIOVENOUS (AV) GORE-TEX GRAFT ARM;  Surgeon: Sheree Penne Bruckner, MD;  Location: [facility] OR;  Service: Vascular;  Laterality: Left;   BASCILIC VEIN TRANSPOSITION Left 03/01/2018   Procedure: BASILIC VEIN TRANSPOSITION SECOND STAGE;  Surgeon: Sheree Penne Bruckner, MD;  Location: [facility] OR;  Service: Vascular;  Laterality: Left;   INSERTION OF DIALYSIS CATHETER Right 12/23/2017   Procedure: INSERTION OF PALINDROME  DIALYSIS CATHETER;  Surgeon: Sheree Penne Bruckner, MD;  Location: [facility] OR;  Service: Vascular;  Laterality: Right;   INSERTION OF DIALYSIS CATHETER Left 02/04/2023   Procedure: INSERTION OF TUNNELED DIALYSIS CATHETER;  Surgeon: Sheree Penne Bruckner, MD;  Location: [facility] OR;  Service: Vascular;  Laterality: Left;   MASS EXCISION  02/04/2023   Procedure: EXCISION PSEUDOANEURYSM LEFT ARM;  Surgeon: Sheree Penne Bruckner, MD;  Location: [facility] OR;  Service: Vascular;;   PERIPHERAL VASCULAR BALLOON ANGIOPLASTY  12/13/2022   Procedure: PERIPHERAL VASCULAR BALLOON ANGIOPLASTY;  Surgeon: Sheree Penne Bruckner, MD;  Location: [facility] INVASIVE CV LAB;  Service: Cardiovascular;;   REVISION OF ARTERIOVENOUS GORETEX GRAFT Left 02/04/2023   Procedure: REVISION OF LEFT ARM ARTERIOVENOUS GORETEX GRAFT USING 7mm GORETEX STRETCH GRAFT;  Surgeon: Sheree Penne Bruckner, MD;  Location: [facility] OR;  Service: Vascular;  Laterality: Left;   THROMBECTOMY BRACHIAL ARTERY Left 07/26/2018   Procedure: THROMBECTOMY LEFT UPPER EXTREMITY;  Surgeon: Sheree Penne Bruckner, MD;  Location: [facility] OR;  Service: Vascular;  Laterality: Left;   TONSILLECTOMY  1985   VEIN REPAIR Left 07/26/2018   Procedure: STENT LEFT AXILLARY VEIN;  Surgeon: Sheree Penne Bruckner, MD;  Location: [facility] OR;  Service: Vascular;  Laterality: Left;    Short Social History:   Social History   Tobacco Use   Smoking status: Every Day    Current packs/day: 0.25    Average packs/day: 0.3 packs/day for 52.7 years (13.2 ttl pk-yrs)    Types: Cigarettes    Start date: 07/13/1971    Passive exposure: Current   Smokeless tobacco: Never   Tobacco comments:    Pt declines to quit as of 10/14/2023.  Substance Use Topics   Alcohol use: Not Currently    Alcohol/week: 2.0 standard drinks of alcohol    Types: 2 Cans of beer per week    Comment: quit years ago    Allergies  Allergen Reactions   Lipitor [Atorvastatin] Other (See Comments)    pain    Current Outpatient Medications  Medication Sig Dispense Refill   acetaminophen  (TYLENOL ) 650 MG CR tablet Take 1,300 mg by mouth every 8 (eight) hours as needed for pain.     amLODipine  (NORVASC ) 2.5 MG tablet Take 1 tablet (2.5 mg total) by mouth daily. 90 tablet 3   aspirin  81 MG EC tablet Take 81 mg by mouth daily.     diphenhydrAMINE (BENADRYL) 25 mg capsule Take 75 mg by mouth daily as needed for itching.     Doxercalciferol  (HECTOROL  IV) Doxercalciferol  (Hectorol )     Doxercalciferol  (HECTOROL  IV) 6 mcg.     Dulaglutide  (TRULICITY ) 1.5 MG/0.5ML SOAJ Inject 1.5 mg into the skin once a week. 6 mL 3   glucose blood (ONE TOUCH ULTRA TEST) test strip Use as instructed once daily E11.9 100 each 12   Lancets (ONETOUCH ULTRASOFT) lancets 1 each by Other route 2 (two) times daily. Use to check blood sugars once a day Dx e11.9 100 each 3   losartan (COZAAR) 50 MG tablet Take 50 mg by mouth daily.     Methoxy PEG-Epoetin  Beta (MIRCERA IJ) 75 mcg.     metoprolol  succinate (TOPROL -XL) 50 MG 24 hr tablet TAKE 1 TABLET BY MOUTH EVERY DAY  WITH OR IMMEDIATELY FOLLOWING A MEAL 90 tablet 3   rosuvastatin  (CRESTOR ) 20 MG tablet TAKE 1 TABLET BY MOUTH EVERY DAY 90 tablet 3   SENSIPAR  30 MG tablet Take 30 mg by mouth daily.     VELPHORO 500 MG chewable tablet Chew 1,000 mg by mouth 2 (two) times daily.     No current  facility-administered medications for this visit.    Review of Systems  Constitutional:  Constitutional negative. HENT: HENT negative.  Eyes: Eyes negative.  Respiratory: Respiratory negative.  Cardiovascular: Cardiovascular negative.  GI: Gastrointestinal negative.  Musculoskeletal: Musculoskeletal negative.  Skin: Skin negative.  Neurological: Neurological negative. Hematologic: Positive for bruises/bleeds easily.  Psychiatric: Psychiatric negative.        Objective:  Objective   Vitals:   04/11/24 1506  BP: 130/71  Weight: 163 lb (73.9 kg)  Height: 6' (1.829 m)   Body mass index is 22.11 kg/m.  Physical Exam HENT:     Head: Normocephalic.     Nose: Nose normal.     Mouth/Throat:     Mouth: Mucous membranes are moist.  Cardiovascular:     Rate and Rhythm: Normal rate.  Pulmonary:     Effort: Pulmonary effort is normal.  Neurological:     Mental Status: He is alert.     Data: No new studies     Assessment/Plan:     63 year old male with ulceration of his left upper arm AV graft as noted above.  He also has pulsatility in the graft.  Plan will be for fistulogram with revision of the graft and possible placement of TDC on nondialysis day in the very near future.  We discussed the risk particularly of requiring the graft to be ligated if we cannot salvage which would render him catheter dependent for at least a short time.  He demonstrates good understanding we will get him scheduled in the near future.  He is okay to use the graft tomorrow for dialysis but needs to avoid cannulation near the area of concern.     Penne Lonni Colorado MD [facility]

## 2024-04-12 ENCOUNTER — Encounter (HOSPITAL_COMMUNITY): Payer: Self-pay | Admitting: Vascular Surgery

## 2024-04-12 ENCOUNTER — Other Ambulatory Visit: Payer: Self-pay

## 2024-04-12 NOTE — Anesthesia Preprocedure Evaluation (Signed)
 Anesthesia Evaluation  Patient identified by MRN, date of birth, ID band Patient awake    Reviewed: Allergy & Precautions, NPO status , Patient's Chart, lab work & pertinent test results  History of Anesthesia Complications Negative for: history of anesthetic complications  Airway Mallampati: II  TM Distance: >3 FB Neck ROM: Full    Dental no notable dental hx.    Pulmonary asthma , Current Smoker   Pulmonary exam normal        Cardiovascular hypertension, Pt. on medications Normal cardiovascular exam     Neuro/Psych       Dementia CVA (left leg weak), Residual Symptoms    GI/Hepatic negative GI ROS, Neg liver ROS,,,  Endo/Other  diabetes (on Trulicity ), Type 2    Renal/GU Dialysis and ESRFRenal disease (HD T/Th/Sa)     Musculoskeletal  (+) Arthritis ,    Abdominal   Peds  Hematology  (+) Blood dyscrasia, anemia   Anesthesia Other Findings   Reproductive/Obstetrics                              Anesthesia Physical Anesthesia Plan  ASA: 4 and emergent  Anesthesia Plan: MAC   Post-op Pain Management: Tylenol  PO (pre-op )*   Induction:   PONV Risk Score and Plan: 2 and Treatment may vary due to age or medical condition, Ondansetron , Midazolam  and Propofol  infusion  Airway Management Planned: Natural Airway and Simple Face Mask  Additional Equipment: None  Intra-op Plan:   Post-operative Plan:   Informed Consent: I have reviewed the patients History and Physical, chart, labs and discussed the procedure including the risks, benefits and alternatives for the proposed anesthesia with the patient or authorized representative who has indicated his/her understanding and acceptance.       Plan Discussed with: CRNA  Anesthesia Plan Comments: (K 6.0 then 6.5 on recheck this morning (BNP sent as well). Discussed with Dr. Sheree, patient has fistula that is at risk for major bleed due to  ulceration so will proceed with [facility] placement and ligation of fistula under sedation. Will give insulin /dextrose  to temporize until patient can be dialyzed. Lawence, MD)         Anesthesia Quick Evaluation

## 2024-04-12 NOTE — Progress Notes (Addendum)
 PCP - Norleen Lynwood ORN, MD  Cardiologist -    PPM/ICD - denies Device Orders - n/a Rep Notified - n/a  Chest x-ray - 06-09-23 EKG - DOS Stress Test - denies ECHO - 07-17-2018 Cardiac Cath - denies  CPAP - denies  GLP-1 -Dulaglutide  (TRULICITY ) Last dose 04-08-24  DM -Per patient's brother MD monitors A1c. Last A1c on 11-28-23 was 5.5  Blood Thinner Instructions: denies Aspirin  Instructions: Per patient's brother was told to hold DOS  ERAS Protcol - NPO  COVID TEST- n/a  Anesthesia review: Yes Hx of HTN, CKD, DM, Dementia, HTN. Patient lives with his brother Gerre and mom is legal guardian. Per patient's brother he has some left sided weakness from stroke  Patient verbally denies any shortness of breath, fever, cough and chest pain during phone call   -------------  SDW INSTRUCTIONS given:  Your procedure is scheduled on April 13, 2024.  Report to [facility] Main Entrance A at 5:30 A.M., and check in at the Admitting office.  Call this number if you have problems the morning of surgery:  [phone]   Remember:  Do not eat or drink after midnight the night before your surgery      Take these medicines the morning of surgery with A SIP OF WATER  acetaminophen  (TYLENOL )  amLODipine  (NORVASC )  cinacalcet  (SENSIPAR )  rosuvastatin  (CRESTOR )    As of today, STOP taking any Aspirin  (unless otherwise instructed by your surgeon) Aleve, Naproxen, Ibuprofen, Motrin, Advil, Goody's, BC's, all herbal medications, fish oil, and all vitamins.                      Do not wear jewelry, make up, or nail polish            Do not wear lotions, powders, perfumes/colognes, or deodorant.            Do not shave 48 hours prior to surgery.  Men may shave face and neck.            Do not bring valuables to the hospital.            [facility] is not responsible for any belongings or valuables.  Do NOT Smoke (Tobacco/Vaping) 24 hours prior to your procedure If you use a CPAP at  night, you may bring all equipment for your overnight stay.   Contacts, glasses, dentures or bridgework may not be worn into surgery.      For patients admitted to the hospital, discharge time will be determined by your treatment team.   Patients discharged the day of surgery will not be allowed to drive home, and someone needs to stay with them for 24 hours.    Special instructions:   [facility]- Preparing For Surgery  Before surgery, you can play an important role. Because skin is not sterile, your skin needs to be as free of germs as possible. You can reduce the number of germs on your skin by washing with CHG (chlorahexidine gluconate) Soap before surgery.  CHG is an antiseptic cleaner which kills germs and bonds with the skin to continue killing germs even after washing.    Oral Hygiene is also important to reduce your risk of infection.  Remember - BRUSH YOUR TEETH THE MORNING OF SURGERY WITH YOUR REGULAR TOOTHPASTE  Please do not use if you have an allergy to CHG or antibacterial soaps. If your skin becomes reddened/irritated stop using the CHG.  Do not shave (including legs and  underarms) for at least 48 hours prior to first CHG shower. It is OK to shave your face.  Please follow these instructions carefully.   Shower the NIGHT BEFORE SURGERY and the MORNING OF SURGERY with DIAL  Soap.   Pat yourself dry with a CLEAN TOWEL.  Wear CLEAN PAJAMAS to bed the night before surgery  Place CLEAN SHEETS on your bed the night of your first shower and DO NOT SLEEP WITH PETS.   Day of Surgery: Please shower morning of surgery  Wear Clean/Comfortable clothing the morning of surgery Do not apply any deodorants/lotions.   Remember to brush your teeth WITH YOUR REGULAR TOOTHPASTE.   Questions were answered. Patient verbalized understanding of instructions.

## 2024-04-13 ENCOUNTER — Ambulatory Visit (HOSPITAL_COMMUNITY): Admitting: Physician Assistant

## 2024-04-13 ENCOUNTER — Encounter (HOSPITAL_COMMUNITY): Payer: Self-pay | Admitting: Vascular Surgery

## 2024-04-13 ENCOUNTER — Other Ambulatory Visit: Payer: Self-pay

## 2024-04-13 ENCOUNTER — Ambulatory Visit (HOSPITAL_COMMUNITY)

## 2024-04-13 ENCOUNTER — Encounter (HOSPITAL_COMMUNITY): Admitting: Physician Assistant

## 2024-04-13 ENCOUNTER — Observation Stay (HOSPITAL_COMMUNITY)
Admission: RE | Admit: 2024-04-13 | Discharge: 2024-04-13 | Disposition: A | Discharge status: Home / Self Care | Attending: Vascular Surgery | Admitting: Vascular Surgery

## 2024-04-13 ENCOUNTER — Encounter (HOSPITAL_COMMUNITY): Admission: RE | Disposition: A | Payer: Self-pay | Source: Home / Self Care | Attending: Vascular Surgery

## 2024-04-13 ENCOUNTER — Observation Stay (HOSPITAL_COMMUNITY)

## 2024-04-13 DIAGNOSIS — Z8673 Personal history of transient ischemic attack (TIA), and cerebral infarction without residual deficits: Secondary | ICD-10-CM | POA: Insufficient documentation

## 2024-04-13 DIAGNOSIS — I120 Hypertensive chronic kidney disease with stage 5 chronic kidney disease or end stage renal disease: Secondary | ICD-10-CM | POA: Insufficient documentation

## 2024-04-13 DIAGNOSIS — Z794 Long term (current) use of insulin: Secondary | ICD-10-CM | POA: Insufficient documentation

## 2024-04-13 DIAGNOSIS — T82898A Other specified complication of vascular prosthetic devices, implants and grafts, initial encounter: Secondary | ICD-10-CM | POA: Diagnosis not present

## 2024-04-13 DIAGNOSIS — N186 End stage renal disease: Principal | ICD-10-CM | POA: Insufficient documentation

## 2024-04-13 DIAGNOSIS — Y832 Surgical operation with anastomosis, bypass or graft as the cause of abnormal reaction of the patient, or of later complication, without mention of misadventure at the time of the procedure: Secondary | ICD-10-CM | POA: Diagnosis not present

## 2024-04-13 DIAGNOSIS — T82868A Thrombosis of vascular prosthetic devices, implants and grafts, initial encounter: Secondary | ICD-10-CM

## 2024-04-13 DIAGNOSIS — Z7982 Long term (current) use of aspirin: Secondary | ICD-10-CM | POA: Diagnosis not present

## 2024-04-13 DIAGNOSIS — T82511A Breakdown (mechanical) of surgically created arteriovenous shunt, initial encounter: Secondary | ICD-10-CM | POA: Diagnosis not present

## 2024-04-13 DIAGNOSIS — J45909 Unspecified asthma, uncomplicated: Secondary | ICD-10-CM | POA: Diagnosis not present

## 2024-04-13 DIAGNOSIS — Z7985 Long-term (current) use of injectable non-insulin antidiabetic drugs: Secondary | ICD-10-CM | POA: Insufficient documentation

## 2024-04-13 DIAGNOSIS — F0390 Unspecified dementia without behavioral disturbance: Secondary | ICD-10-CM | POA: Insufficient documentation

## 2024-04-13 DIAGNOSIS — E1122 Type 2 diabetes mellitus with diabetic chronic kidney disease: Secondary | ICD-10-CM | POA: Diagnosis not present

## 2024-04-13 DIAGNOSIS — F17210 Nicotine dependence, cigarettes, uncomplicated: Secondary | ICD-10-CM

## 2024-04-13 DIAGNOSIS — Z79899 Other long term (current) drug therapy: Secondary | ICD-10-CM | POA: Diagnosis not present

## 2024-04-13 DIAGNOSIS — Z992 Dependence on renal dialysis: Secondary | ICD-10-CM | POA: Diagnosis not present

## 2024-04-13 HISTORY — PX: INSERTION OF DIALYSIS CATHETER: SHX1324

## 2024-04-13 HISTORY — PX: REVISION OF ARTERIOVENOUS GORETEX GRAFT: SHX6073

## 2024-04-13 LAB — POCT I-STAT, CHEM 8
BUN: 41 mg/dL — ABNORMAL HIGH (ref 8–23)
BUN: 38 mg/dL — ABNORMAL HIGH (ref 8–23)
BUN: 38 mg/dL — ABNORMAL HIGH (ref 8–23)
Calcium, Ion: 1 mmol/L — ABNORMAL LOW (ref 1.15–1.40)
Calcium, Ion: 1.03 mmol/L — ABNORMAL LOW (ref 1.15–1.40)
Calcium, Ion: 1.16 mmol/L (ref 1.15–1.40)
Chloride: 96 mmol/L — ABNORMAL LOW (ref 98–111)
Chloride: 94 mmol/L — ABNORMAL LOW (ref 98–111)
Chloride: 94 mmol/L — ABNORMAL LOW (ref 98–111)
Creatinine, Ser: 10.6 mg/dL — ABNORMAL HIGH (ref 0.61–1.24)
Creatinine, Ser: 11.2 mg/dL — ABNORMAL HIGH (ref 0.61–1.24)
Creatinine, Ser: 10.3 mg/dL — ABNORMAL HIGH (ref 0.61–1.24)
Glucose, Bld: 99 mg/dL (ref 70–99)
Glucose, Bld: 107 mg/dL — ABNORMAL HIGH (ref 70–99)
Glucose, Bld: 118 mg/dL — ABNORMAL HIGH (ref 70–99)
HCT: 35 % — ABNORMAL LOW (ref 39.0–52.0)
HCT: 34 % — ABNORMAL LOW (ref 39.0–52.0)
HCT: 34 % — ABNORMAL LOW (ref 39.0–52.0)
Hemoglobin: 11.9 g/dL — ABNORMAL LOW (ref 13.0–17.0)
Hemoglobin: 11.6 g/dL — ABNORMAL LOW (ref 13.0–17.0)
Hemoglobin: 11.6 g/dL — ABNORMAL LOW (ref 13.0–17.0)
Potassium: 6 mmol/L — ABNORMAL HIGH (ref 3.5–5.1)
Potassium: 6.5 mmol/L (ref 3.5–5.1)
Potassium: 5.4 mmol/L — ABNORMAL HIGH (ref 3.5–5.1)
Sodium: 131 mmol/L — ABNORMAL LOW (ref 135–145)
Sodium: 131 mmol/L — ABNORMAL LOW (ref 135–145)
Sodium: 133 mmol/L — ABNORMAL LOW (ref 135–145)
TCO2: 27 mmol/L (ref 22–32)
TCO2: 27 mmol/L (ref 22–32)
TCO2: 27 mmol/L (ref 22–32)

## 2024-04-13 LAB — GLUCOSE, CAPILLARY
Glucose-Capillary: 106 mg/dL — ABNORMAL HIGH (ref 70–99)
Glucose-Capillary: 113 mg/dL — ABNORMAL HIGH (ref 70–99)
Glucose-Capillary: 148 mg/dL — ABNORMAL HIGH (ref 70–99)
Glucose-Capillary: 161 mg/dL — ABNORMAL HIGH (ref 70–99)
Glucose-Capillary: 123 mg/dL — ABNORMAL HIGH (ref 70–99)
Glucose-Capillary: 51 mg/dL — ABNORMAL LOW (ref 70–99)
Glucose-Capillary: 198 mg/dL — ABNORMAL HIGH (ref 70–99)
Glucose-Capillary: 83 mg/dL (ref 70–99)

## 2024-04-13 LAB — BASIC METABOLIC PANEL WITH GFR
Anion gap: 16 — ABNORMAL HIGH (ref 5–15)
BUN: 36 mg/dL — ABNORMAL HIGH (ref 8–23)
CO2: 26 mmol/L (ref 22–32)
Calcium: 9.4 mg/dL (ref 8.9–10.3)
Chloride: 92 mmol/L — ABNORMAL LOW (ref 98–111)
Creatinine, Ser: 10.32 mg/dL — ABNORMAL HIGH (ref 0.61–1.24)
GFR, Estimated: 5 mL/min — ABNORMAL LOW (ref >60)
Glucose, Bld: 107 mg/dL — ABNORMAL HIGH (ref 70–99)
Potassium: 6.5 mmol/L (ref 3.5–5.1)
Sodium: 134 mmol/L — ABNORMAL LOW (ref 135–145)

## 2024-04-13 LAB — HEPATITIS B SURFACE ANTIGEN: Hepatitis B Surface Ag: NONREACTIVE

## 2024-04-13 SURGERY — INSERTION OF DIALYSIS CATHETER
Anesthesia: General | Site: Chest | Laterality: Left

## 2024-04-13 MED ORDER — PENTAFLUOROPROP-TETRAFLUOROETH EX AERO: 1.0000 | INHALATION_SPRAY | CUTANEOUS | Status: DC | PRN

## 2024-04-13 MED ORDER — ACETAMINOPHEN 650 MG RE SUPP: 325.0000 mg | RECTAL | Status: DC | PRN

## 2024-04-13 MED ORDER — ONDANSETRON HCL 4 MG/2ML IJ SOLN: 4.0000 mg | Freq: Four times a day (QID) | INTRAMUSCULAR | Status: DC | PRN

## 2024-04-13 MED ORDER — SODIUM CHLORIDE 0.9 % IV SOLN: INTRAVENOUS | Status: DC

## 2024-04-13 MED ORDER — DROPERIDOL 2.5 MG/ML IJ SOLN
0.6250 mg | Freq: Once | INTRAMUSCULAR | Status: DC | PRN
Start: 2024-04-13 — End: 2024-04-13

## 2024-04-13 MED ORDER — OXYCODONE-ACETAMINOPHEN 5-325 MG PO TABS: 1.0000 | ORAL_TABLET | Freq: Four times a day (QID) | ORAL | 0 refills | Status: DC | PRN

## 2024-04-13 MED ORDER — INSULIN ASPART 100 UNIT/ML IJ SOLN: 0.0000 [IU] | INTRAMUSCULAR | Status: DC | PRN

## 2024-04-13 MED ORDER — SUCROFERRIC OXYHYDROXIDE 500 MG PO CHEW: 1500.0000 mg | CHEWABLE_TABLET | Freq: Every day | ORAL | Status: DC

## 2024-04-13 MED ORDER — ALTEPLASE 2 MG IJ SOLR: 2.0000 mg | Freq: Once | INTRAMUSCULAR | Status: DC | PRN

## 2024-04-13 MED ORDER — INSULIN ASPART 100 UNIT/ML IV SOLN
5.0000 [IU] | Freq: Once | INTRAVENOUS | Status: AC
  Administered 2024-04-13: 5 [IU] via INTRAVENOUS
  Filled 2024-04-13: qty 0.05

## 2024-04-13 MED ORDER — GUAIFENESIN-DM 100-10 MG/5ML PO SYRP: 15.0000 mL | ORAL_SOLUTION | ORAL | Status: DC | PRN

## 2024-04-13 MED ORDER — OXYCODONE HCL 5 MG PO TABS: 5.0000 mg | ORAL_TABLET | Freq: Once | ORAL | Status: DC | PRN

## 2024-04-13 MED ORDER — PANTOPRAZOLE SODIUM 40 MG PO TBEC
40.0000 mg | DELAYED_RELEASE_TABLET | Freq: Every day | ORAL | Status: DC
  Administered 2024-04-13: 40 mg via ORAL
  Filled 2024-04-13: qty 1

## 2024-04-13 MED ORDER — CALCIUM GLUCONATE-NACL 1-0.675 GM/50ML-% IV SOLN
1.0000 g | Freq: Once | INTRAVENOUS | Status: AC
  Administered 2024-04-13: 1 g via INTRAVENOUS
  Filled 2024-04-13: qty 50

## 2024-04-13 MED ORDER — ASPIRIN 81 MG PO TBEC
81.0000 mg | DELAYED_RELEASE_TABLET | Freq: Every day | ORAL | Status: DC
  Administered 2024-04-13: 81 mg via ORAL
  Filled 2024-04-13: qty 1

## 2024-04-13 MED ORDER — HEPARIN 6000 UNIT IRRIGATION SOLUTION
Status: AC
  Filled 2024-04-13: qty 500

## 2024-04-13 MED ORDER — CHLORHEXIDINE GLUCONATE 4 % EX SOLN: 60.0000 mL | Freq: Once | CUTANEOUS | Status: DC

## 2024-04-13 MED ORDER — PROPOFOL 500 MG/50ML IV EMUL
INTRAVENOUS | Status: DC | PRN
  Administered 2024-04-13: 30 ug/kg/min via INTRAVENOUS

## 2024-04-13 MED ORDER — LOSARTAN POTASSIUM 50 MG PO TABS
50.0000 mg | ORAL_TABLET | Freq: Every day | ORAL | Status: DC
Start: 2024-04-13 — End: 2024-04-14

## 2024-04-13 MED ORDER — MIDAZOLAM HCL 2 MG/2ML IJ SOLN
INTRAMUSCULAR | Status: DC | PRN
  Administered 2024-04-13 (×2): 1 mg via INTRAVENOUS

## 2024-04-13 MED ORDER — AMLODIPINE BESYLATE 5 MG PO TABS: 2.5000 mg | ORAL_TABLET | Freq: Every day | ORAL | Status: DC

## 2024-04-13 MED ORDER — INSULIN ASPART 100 UNIT/ML IJ SOLN: 0.0000 [IU] | Freq: Three times a day (TID) | INTRAMUSCULAR | Status: DC

## 2024-04-13 MED ORDER — METOPROLOL SUCCINATE ER 50 MG PO TB24: 50.0000 mg | ORAL_TABLET | Freq: Every day | ORAL | Status: DC

## 2024-04-13 MED ORDER — FENTANYL CITRATE (PF) 250 MCG/5ML IJ SOLN
INTRAMUSCULAR | Status: DC | PRN
  Administered 2024-04-13: 25 ug via INTRAVENOUS
  Administered 2024-04-13: 50 ug via INTRAVENOUS
  Administered 2024-04-13: 25 ug via INTRAVENOUS

## 2024-04-13 MED ORDER — PROPOFOL 10 MG/ML IV BOLUS
INTRAVENOUS | Status: AC
  Filled 2024-04-13: qty 20

## 2024-04-13 MED ORDER — LIDOCAINE-PRILOCAINE 2.5-2.5 % EX CREA: 1.0000 | TOPICAL_CREAM | CUTANEOUS | Status: DC | PRN

## 2024-04-13 MED ORDER — HEPARIN SODIUM (PORCINE) 1000 UNIT/ML DIALYSIS: 1000.0000 [IU] | INTRAMUSCULAR | Status: DC | PRN

## 2024-04-13 MED ORDER — LIDOCAINE HCL (PF) 1 % IJ SOLN
INTRAMUSCULAR | Status: AC
Start: 2024-04-13 — End: 2024-04-13
  Filled 2024-04-13: qty 30

## 2024-04-13 MED ORDER — LIDOCAINE-EPINEPHRINE (PF) 1 %-1:200000 IJ SOLN
INTRAMUSCULAR | Status: AC
  Filled 2024-04-13: qty 30

## 2024-04-13 MED ORDER — HYDROMORPHONE HCL 1 MG/ML IJ SOLN: 0.5000 mg | INTRAMUSCULAR | Status: DC | PRN

## 2024-04-13 MED ORDER — METOPROLOL SUCCINATE ER 25 MG PO TB24
ORAL_TABLET | ORAL | Status: AC
  Administered 2024-04-13: 50 mg via ORAL
  Filled 2024-04-13: qty 2

## 2024-04-13 MED ORDER — HEPARIN SODIUM (PORCINE) 1000 UNIT/ML IJ SOLN
INTRAMUSCULAR | Status: AC
  Filled 2024-04-13: qty 10

## 2024-04-13 MED ORDER — DEXTROSE 50 % IV SOLN
1.0000 | Freq: Once | INTRAVENOUS | Status: AC
  Administered 2024-04-13: 25 g via INTRAVENOUS

## 2024-04-13 MED ORDER — ANTICOAGULANT SODIUM CITRATE 4% (200MG/5ML) IV SOLN: 5.0000 mL | Status: DC | PRN

## 2024-04-13 MED ORDER — LACTATED RINGERS IV SOLN: INTRAVENOUS | Status: DC

## 2024-04-13 MED ORDER — NEPRO/CARBSTEADY PO LIQD: 237.0000 mL | ORAL | Status: DC | PRN

## 2024-04-13 MED ORDER — ORAL CARE MOUTH RINSE: 15.0000 mL | Freq: Once | OROMUCOSAL | Status: AC

## 2024-04-13 MED ORDER — ACETAMINOPHEN 325 MG PO TABS: 325.0000 mg | ORAL_TABLET | ORAL | Status: DC | PRN

## 2024-04-13 MED ORDER — POLYETHYLENE GLYCOL 3350 17 G PO PACK: 17.0000 g | PACK | Freq: Every day | ORAL | Status: DC | PRN

## 2024-04-13 MED ORDER — LIDOCAINE-EPINEPHRINE (PF) 1 %-1:200000 IJ SOLN
INTRAMUSCULAR | Status: AC
Start: 2024-04-13 — End: 2024-04-13
  Filled 2024-04-13: qty 30

## 2024-04-13 MED ORDER — FENTANYL CITRATE (PF) 100 MCG/2ML IJ SOLN: 25.0000 ug | INTRAMUSCULAR | Status: DC | PRN

## 2024-04-13 MED ORDER — ACETAMINOPHEN 10 MG/ML IV SOLN
1000.0000 mg | Freq: Once | INTRAVENOUS | Status: DC | PRN
Start: 2024-04-13 — End: 2024-04-13

## 2024-04-13 MED ORDER — CHLORHEXIDINE GLUCONATE CLOTH 2 % EX PADS: 6.0000 | MEDICATED_PAD | Freq: Every day | CUTANEOUS | Status: DC

## 2024-04-13 MED ORDER — CHLORHEXIDINE GLUCONATE 0.12 % MT SOLN
15.0000 mL | Freq: Once | OROMUCOSAL | Status: AC
  Administered 2024-04-13: 15 mL via OROMUCOSAL
  Filled 2024-04-13: qty 15

## 2024-04-13 MED ORDER — DIPHENHYDRAMINE HCL 25 MG PO CAPS: 50.0000 mg | ORAL_CAPSULE | Freq: Every day | ORAL | Status: DC | PRN

## 2024-04-13 MED ORDER — ALUM & MAG HYDROXIDE-SIMETH 200-200-20 MG/5ML PO SUSP: 15.0000 mL | ORAL | Status: DC | PRN

## 2024-04-13 MED ORDER — OXYCODONE-ACETAMINOPHEN 5-325 MG PO TABS: 1.0000 | ORAL_TABLET | ORAL | Status: DC | PRN

## 2024-04-13 MED ORDER — 0.9 % SODIUM CHLORIDE (POUR BTL) OPTIME
TOPICAL | Status: DC | PRN
  Administered 2024-04-13: 1000 mL

## 2024-04-13 MED ORDER — OXYCODONE HCL 5 MG/5ML PO SOLN: 5.0000 mg | Freq: Once | ORAL | Status: DC | PRN

## 2024-04-13 MED ORDER — LABETALOL HCL 5 MG/ML IV SOLN: 10.0000 mg | INTRAVENOUS | Status: DC | PRN

## 2024-04-13 MED ORDER — MIDAZOLAM HCL 2 MG/2ML IJ SOLN
INTRAMUSCULAR | Status: AC
  Filled 2024-04-13: qty 2

## 2024-04-13 MED ORDER — PHENOL 1.4 % MT LIQD: 1.0000 | OROMUCOSAL | Status: DC | PRN

## 2024-04-13 MED ORDER — CEFAZOLIN SODIUM-DEXTROSE 2-4 GM/100ML-% IV SOLN
2.0000 g | INTRAVENOUS | Status: AC
  Administered 2024-04-13: 2 g via INTRAVENOUS
  Filled 2024-04-13: qty 100

## 2024-04-13 MED ORDER — LIDOCAINE-EPINEPHRINE (PF) 1 %-1:200000 IJ SOLN
INTRAMUSCULAR | Status: DC | PRN
  Administered 2024-04-13: 40 mL

## 2024-04-13 MED ORDER — CINACALCET HCL 30 MG PO TABS
120.0000 mg | ORAL_TABLET | Freq: Every day | ORAL | Status: DC
Start: 2024-04-14 — End: 2024-04-14

## 2024-04-13 MED ORDER — HYDRALAZINE HCL 20 MG/ML IJ SOLN: 5.0000 mg | INTRAMUSCULAR | Status: DC | PRN

## 2024-04-13 MED ORDER — HEPARIN 6000 UNIT IRRIGATION SOLUTION
Status: DC | PRN
  Administered 2024-04-13: 1

## 2024-04-13 MED ORDER — METOPROLOL TARTRATE 5 MG/5ML IV SOLN: 2.0000 mg | INTRAVENOUS | Status: DC | PRN

## 2024-04-13 MED ORDER — ROSUVASTATIN CALCIUM 20 MG PO TABS: 20.0000 mg | ORAL_TABLET | Freq: Every day | ORAL | Status: DC

## 2024-04-13 MED ORDER — LIDOCAINE HCL (PF) 1 % IJ SOLN: 5.0000 mL | INTRAMUSCULAR | Status: DC | PRN

## 2024-04-13 MED ORDER — FENTANYL CITRATE (PF) 250 MCG/5ML IJ SOLN
INTRAMUSCULAR | Status: AC
  Filled 2024-04-13: qty 5

## 2024-04-13 MED ORDER — HEPARIN SODIUM (PORCINE) 1000 UNIT/ML IJ SOLN
INTRAMUSCULAR | Status: DC | PRN
  Administered 2024-04-13: 3800 [IU]

## 2024-04-13 SURGICAL SUPPLY — 45 items
ARMBAND PINK RESTRICT EXTREMIT (MISCELLANEOUS) ×6 IMPLANT
BAG COUNTER SPONGE SURGICOUNT (BAG) ×3 IMPLANT
BAG DECANTER FOR FLEXI CONT (MISCELLANEOUS) ×3 IMPLANT
BIOPATCH RED 1 DISK 7.0 (GAUZE/BANDAGES/DRESSINGS) ×3 IMPLANT
BNDG ELASTIC 4INX 5YD STR LF (GAUZE/BANDAGES/DRESSINGS) IMPLANT
BNDG ELASTIC 6INX 5YD STR LF (GAUZE/BANDAGES/DRESSINGS) IMPLANT
BNDG GAUZE DERMACEA FLUFF 4 (GAUZE/BANDAGES/DRESSINGS) IMPLANT
CANISTER SUCTION 3000ML PPV (SUCTIONS) ×3 IMPLANT
CATH PALINDROME-P 23 W/VT (CATHETERS) IMPLANT
CATH PALINDROME-P 28CM W/VT (CATHETERS) IMPLANT
CLIP LIGATING EXTRA MED SLVR (CLIP) ×3 IMPLANT
CLIP LIGATING EXTRA SM BLUE (MISCELLANEOUS) ×3 IMPLANT
COVER PROBE W GEL 5X96 (DRAPES) ×3 IMPLANT
DERMABOND ADVANCED .7 DNX12 (GAUZE/BANDAGES/DRESSINGS) ×3 IMPLANT
DRAPE C-ARM 42X72 X-RAY (DRAPES) ×3 IMPLANT
DRAPE SURG ORHT 6 SPLT 77X108 (DRAPES) IMPLANT
ELECTRODE REM PT RTRN 9FT ADLT (ELECTROSURGICAL) ×3 IMPLANT
GAUZE PAD ABD 8X10 STRL (GAUZE/BANDAGES/DRESSINGS) IMPLANT
GAUZE XEROFORM 1X8 LF (GAUZE/BANDAGES/DRESSINGS) IMPLANT
GLOVE BIO SURGEON STRL SZ7.5 (GLOVE) ×3 IMPLANT
GOWN STRL REUS W/ TWL LRG LVL3 (GOWN DISPOSABLE) ×6 IMPLANT
GOWN STRL REUS W/ TWL XL LVL3 (GOWN DISPOSABLE) ×3 IMPLANT
KIT BASIN OR (CUSTOM PROCEDURE TRAY) ×3 IMPLANT
KIT TURNOVER KIT B (KITS) ×3 IMPLANT
NDL 18GX1X1/2 (RX/OR ONLY) (NEEDLE) ×3 IMPLANT
NEEDLE 18GX1X1/2 (RX/OR ONLY) (NEEDLE) ×2 IMPLANT
PACK CV ACCESS (CUSTOM PROCEDURE TRAY) ×3 IMPLANT
PAD ARMBOARD POSITIONER FOAM (MISCELLANEOUS) ×6 IMPLANT
POWDER SURGICEL 3.0 GRAM (HEMOSTASIS) IMPLANT
SET MICROPUNCTURE 5F STIFF (MISCELLANEOUS) IMPLANT
SOLN 0.9% NACL 1000 ML (IV SOLUTION) ×2 IMPLANT
SOLN 0.9% NACL POUR BTL 1000ML (IV SOLUTION) ×3 IMPLANT
SOLN STERILE WATER 1000 ML (IV SOLUTION) ×2 IMPLANT
SOLN STERILE WATER BTL 1000 ML (IV SOLUTION) ×3 IMPLANT
STAPLER SKIN PROX 35W (STAPLE) IMPLANT
SUT ETHILON 3 0 PS 1 (SUTURE) ×3 IMPLANT
SUT MNCRL AB 4-0 PS2 18 (SUTURE) ×3 IMPLANT
SUT PROLENE 5 0 C 1 36 (SUTURE) IMPLANT
SUT PROLENE 6 0 BV (SUTURE) ×6 IMPLANT
SUT VIC AB 3-0 SH 27X BRD (SUTURE) ×6 IMPLANT
SYR 5ML LL (SYRINGE) ×3 IMPLANT
SYR CONTROL 10ML LL (SYRINGE) ×3 IMPLANT
TOWEL GREEN STERILE (TOWEL DISPOSABLE) ×3 IMPLANT
TOWEL GREEN STERILE FF (TOWEL DISPOSABLE) ×6 IMPLANT
UNDERPAD 30X36 HEAVY ABSORB (UNDERPADS AND DIAPERS) ×3 IMPLANT

## 2024-04-13 NOTE — Plan of Care (Signed)
 Patient admitted to floor this am, left unit afternoon for dialysis, returned to unit around 1715. Pt is A&Ox4, HD access site clean/dry/Intact. Patient has orders to be discharge to home today. No acute distress noted.

## 2024-04-13 NOTE — Discharge Instructions (Signed)

## 2024-04-13 NOTE — Op Note (Signed)
    Patient name: Christopher Lopez MRN: [id number] DOB: Mar 02, 1961 Sex: male  04/13/2024 Pre-operative Diagnosis: End-stage renal disease, malfunctioning left arm AV graft Post-operative diagnosis:  Same Surgeon:  Penne JAYSON. Sheree, MD Assistant: Adina Sender, PA Procedure Performed: 1.  Placement left IJ 23 cm tunneled dialysis catheter with ultrasound and fluoroscopic guidance 2.  Ligation and excision left upper extremity AV graft  Indications: 63 year old male with history of end-stage renal disease has been dialyzing via left upper arm AV graft.  He received a full treatment dialysis yesterday now presents with potassium of 6.5.  He has an area of pulsatility that appears to be communicating directly to the graft with concern for rupture.  Given elevated potassium levels and areas of dysfunction of the graft we have planned for ligation with placement of tunneled catheter.  Findings: The left IJ was patent.  Tunneled dialysis catheter was placed to the SVC atrial junction.  The graft was completely degenerated in 2 areas this was totally excised and ligated proximally and distally.  The tissue did appear healthy and was closed primarily.   Procedure:  The patient was identified in the holding area and taken to the operating room where he is placed supine upper table and MAC anesthesia was induced.  He was sterilely prepped and draped in the bilateral neck and left upper extremity usual fashion, antibiotics were administered and a timeout was called.  We began using ultrasound to identify the left internal jugular vein the area of the expected catheter placement was anesthetized 1% lidocaine  with epinephrine  and we then cannulated the left IJ with a micropuncture needle followed by wire and the sheath.  From a counterincision I then tunneled a 23 cm catheter.  Using fluoroscopic guidance we then directed the J-wire into the IVC.  I then dilated the wire tract and placed the introducer sheath.  The  catheter was then placed the SVC atrial junction.  It was flushed with heparinized saline affixed to the skin with 3-0 nylon suture the neck incision was closed with 4-0 Monocryl and sterile dressing was applied.  Catheter was locked with 1.9 cc of concentrated heparin  in both ports.  We then turned our attention to the left upper extremity.  I made a longitudinal incision proximally distally over the graft and dissected down and obtained proximal and distal control and clamped the graft.  We then excised the large pseudoaneurysmal area where there was thrombus and very unhealthy tissue.  We excised back to healthy tissue obtained hemostasis with cautery.  We then oversewed the graft proximally and distally with running 5-0 Prolene suture in mattress fashion.  The wound was thoroughly irrigated and I closed tissue over the graft with interrupted Vicryl suture.  The skin was closed with staples and a sterile dressing was applied.  The patient was awakened from anesthesia having tolerated procedure without any complication.  All counts were correct at completion.  EBL: 50 cc    Elwanda Moger C. Sheree, MD [facility] Office: [phone] Pager: [phone]

## 2024-04-13 NOTE — Discharge Planning (Signed)
 [facility] Patient Discharge Orders- [facility] CLINIC: [place]  Patient's name: Christopher Lopez Admit/DC Dates: 04/13/2024 - 04/13/2024  Discharge Diagnoses: Ligation and excision of left arm AVG  Hyperkalemia. Received dialysis off schedule     Outpatient Dialysis Orders:  No change to outpatient dialysis orders   Anemia Aranesp: Given: --   Date of last dose/amount: --   PRBC's Given: -- Date/# of units: --   Recent Labs  Lab 04/13/24 0655 04/13/24 0949  HGB 11.6* 11.6*  K 6.5*  6.5* 5.4*  CALCIUM  9.4  --     Access intervention/Change:  -Excision of left arm AVG and placement of left internal jugular TDC by Dr.Cain on 04/13/24   Medications: -IV Antibiotics: ---  OTHER/APPTS   Completed by: Maisie Ronnald Acosta PA-C   D/C Meds to be reconciled by nurse after every discharge.    Reviewed by: MD:______ RN_______

## 2024-04-13 NOTE — Inpatient Diabetes Management (Signed)
 Inpatient Diabetes Program Recommendations  AACE/ADA: New Consensus Statement on Inpatient Glycemic Control (2015)  Target Ranges:  Prepandial:   less than 140 mg/dL      Peak postprandial:   less than 180 mg/dL (1-2 hours)      Critically ill patients:  140 - 180 mg/dL   Lab Results  Component Value Date   GLUCAP 198 (H) 04/13/2024   HGBA1C 5.5 11/28/2023    Review of Glycemic Control  Latest Reference Range & Units 04/13/24 06:04 04/13/24 07:57 04/13/24 08:30 04/13/24 08:56 04/13/24 09:33 04/13/24 11:50 04/13/24 13:11  Glucose-Capillary 70 - 99 mg/dL 893 (H) 886 (H) 838 (H) 148 (H) 123 (H) 51 (L) 198 (H)   Diabetes history: DM 2 Outpatient Diabetes medications: trulicity  1.5 mg weekly Current orders for Inpatient glycemic control:  Novolog  0-15 units tid   Note: hypoglycemia on current insulin  sliding scale. Pt has elevated renal function and is more sensitive to insulin  and insulin  stacking  Inpatient Diabetes Program Recommendations:    -  reduce Novolog  to Novolog  0-6 units tid + add hs scale  Thanks,  Clotilda Bull RN, MSN, BC-ADM Inpatient Diabetes Coordinator Team Pager [phone] (8a-5p)

## 2024-04-13 NOTE — Transfer of Care (Signed)
 Immediate Anesthesia Transfer of Care Note  Patient: Christopher Lopez  Procedure(s) Performed: INSERTION OF DIALYSIS CATHETER USING PALINDROME 23CM CATHETER (Left: Chest) EXCISION AND LIGATION OF ARTERIOVENOUS GORETEX GRAFT (Left: Arm Upper)  Patient Location: PACU  Anesthesia Type:MAC  Level of Consciousness: awake, alert , and oriented  Airway & Oxygen Therapy: Patient Spontanous Breathing and Patient connected to nasal cannula oxygen  Post-op Assessment: Report given to RN and Post -op Vital signs reviewed and stable  Post vital signs: Reviewed and stable  Last Vitals:  Vitals Value Taken Time  BP 112/61 04/13/24 09:30  Temp 37.1 C 04/13/24 09:30  Pulse 86 04/13/24 09:36  Resp 10 04/13/24 09:36  SpO2 99 % 04/13/24 09:36  Vitals shown include unfiled device data.  Last Pain:  Vitals:   04/13/24 0623  TempSrc:   PainSc: 0-No pain         Complications: No notable events documented.

## 2024-04-13 NOTE — Progress Notes (Signed)
 Pt receives out-pt HD at [facility] clinic,TTS, 1120 chair time. Will continue to assist as needed.   Marky Buresh Dialysis Navigator [phone]

## 2024-04-13 NOTE — Procedures (Signed)
 S: pt seen in HD unit, just getting started. Pt stable, no distress.   Vitals:   04/13/24 1031 04/13/24 1241 04/13/24 1247 04/13/24 1300  BP: (!) 140/71 126/65 117/62 121/72  Pulse: 67 71 68 77  Resp: 12 12 11 18   Temp: 97.7 F (36.5 C) (!) 97.2 F (36.2 C)    TempSrc: Oral     SpO2: 98% 99% 100% 97%  Weight:      Height: 6' (1.829 m)       Recent Labs  Lab 04/13/24 0655 04/13/24 0949  HGB 11.6* 11.6*  CALCIUM  9.4  --   CREATININE 10.32*  11.20* 10.30*  K 6.5*  6.5* 5.4*    Inpatient medications:  amLODipine   2.5 mg Oral Daily   aspirin  EC  81 mg Oral Daily   Chlorhexidine  Gluconate Cloth  6 each Topical Q0600   [START ON 04/14/2024] cinacalcet   120 mg Oral Q breakfast   insulin  aspart  0-15 Units Subcutaneous TID WC   losartan  50 mg Oral Daily   [START ON 04/14/2024] metoprolol  succinate  50 mg Oral Daily   pantoprazole  40 mg Oral Daily   rosuvastatin   20 mg Oral Daily   [START ON 04/14/2024] sucroferric oxyhydroxide  1,500 mg Oral Q breakfast    anticoagulant sodium citrate     acetaminophen  **OR** acetaminophen , alteplase , alum & mag hydroxide-simeth, anticoagulant sodium citrate, diphenhydrAMINE, feeding supplement (NEPRO CARB STEADY), guaiFENesin-dextromethorphan, heparin , hydrALAZINE , HYDROmorphone (DILAUDID) injection, labetalol , lidocaine  (PF), lidocaine -prilocaine, metoprolol  tartrate, ondansetron , oxyCODONE -acetaminophen , pentafluoroprop-tetrafluoroeth, phenol, polyethylene glycol  I was present at the procedure, reviewed the HD regimen and made appropriate changes.   Myer Fret MD  [facility] 04/13/2024, 1:20 PM

## 2024-04-13 NOTE — Consult Note (Signed)
 Renal Service Consult Note [facility] Lamar JONETTA Fret, MD  Patient: Christopher Lopez Date: 04/13/2024 Requesting Physician: Dr. Sheree  Reason for Consult: ESRD pt sp ligation of AV graft and placement new TDC, has hyperkalemia  HPI: The patient is a 63 y.o. year-old w/ PMH as below who presented to [facility] for risk of AV graft rupture based on exam. Pt was taken to OR where the AV graft was completely degenerated in 2 areas, this was excised and the graft was ligated proximally and distally. Tissues appeared healthy and the wound was closed primarily. Today's K+ came back at 6.5. We are asked to see for hyperkalemia and possible need for HD.     Pt seen in HD unit. Has been on HD for about 4 yrs. Goes to [facility] on [address] in [place]. Denies any SOB.    ROS - denies CP, no joint pain, no HA, no blurry vision, no rash, no diarrhea, no nausea/ vomiting   Past Medical History  Past Medical History:  Diagnosis Date   Alcohol abuse 09/18/2011   Allergic rhinitis, cause unspecified 09/20/2011   Anemia, unspecified 09/18/2011   Arthritis    Childhood asthma 09/20/2011   CKD (chronic kidney disease) stage 5, GFR less than 15 ml/min (HCC)    Dialysis - T/Th/Sa   Colitis 09/18/2011   Dementia (HCC) 09/18/2011   Diabetes mellitus    Diverticulosis of colon without hemorrhage 02/27/2014   Foot ulcer (HCC) 09/18/2011   High cholesterol    HTN (hypertension) 09/18/2011   Hyperlipidemia 11/08/2011   Hypertension    Impaired glucose tolerance 09/18/2011   Pneumonia 09/18/2011   Pre-ulcerative corn or callous 11/20/2011   Stroke (HCC)    left leg deficit   Type II or unspecified type diabetes mellitus without mention of complication, uncontrolled 09/20/2011   Wears glasses    Past Surgical History  Past Surgical History:  Procedure Laterality Date   A/V FISTULAGRAM Left 12/13/2022   Procedure: A/V Fistulagram;  Surgeon: Sheree Penne Bruckner, MD;  Location: [facility]  INVASIVE CV LAB;  Service: Cardiovascular;  Laterality: Left;   AV FISTULA PLACEMENT Left 12/23/2017   Procedure: CREATION OF BASILIAC - CEPHALIC  ARTERIOVENOUS (AV) FISTULA  STAGE ONE;  Surgeon: Sheree Penne Bruckner, MD;  Location: [facility] OR;  Service: Vascular;  Laterality: Left;   AV FISTULA PLACEMENT Left 07/26/2018   Procedure: INSERTION OF ARTERIOVENOUS (AV) GORE-TEX GRAFT ARM;  Surgeon: Sheree Penne Bruckner, MD;  Location: [facility] OR;  Service: Vascular;  Laterality: Left;   BASCILIC VEIN TRANSPOSITION Left 03/01/2018   Procedure: BASILIC VEIN TRANSPOSITION SECOND STAGE;  Surgeon: Sheree Penne Bruckner, MD;  Location: [facility] OR;  Service: Vascular;  Laterality: Left;   INSERTION OF DIALYSIS CATHETER Right 12/23/2017   Procedure: INSERTION OF PALINDROME  DIALYSIS CATHETER;  Surgeon: Sheree Penne Bruckner, MD;  Location: [facility] OR;  Service: Vascular;  Laterality: Right;   INSERTION OF DIALYSIS CATHETER Left 02/04/2023   Procedure: INSERTION OF TUNNELED DIALYSIS CATHETER;  Surgeon: Sheree Penne Bruckner, MD;  Location: [facility] OR;  Service: Vascular;  Laterality: Left;   MASS EXCISION  02/04/2023   Procedure: EXCISION PSEUDOANEURYSM LEFT ARM;  Surgeon: Sheree Penne Bruckner, MD;  Location: [facility] OR;  Service: Vascular;;   PERIPHERAL VASCULAR BALLOON ANGIOPLASTY  12/13/2022   Procedure: PERIPHERAL VASCULAR BALLOON ANGIOPLASTY;  Surgeon: Sheree Penne Bruckner, MD;  Location: [facility] INVASIVE CV LAB;  Service: Cardiovascular;;   REVISION OF ARTERIOVENOUS GORETEX GRAFT Left 02/04/2023   Procedure: REVISION OF LEFT  ARM ARTERIOVENOUS GORETEX GRAFT USING 7mm GORETEX STRETCH GRAFT;  Surgeon: Sheree Penne Bruckner, MD;  Location: [facility] OR;  Service: Vascular;  Laterality: Left;   THROMBECTOMY BRACHIAL ARTERY Left 07/26/2018   Procedure: THROMBECTOMY LEFT UPPER EXTREMITY;  Surgeon: Sheree Penne Bruckner, MD;  Location: [facility] OR;  Service: Vascular;  Laterality: Left;   TONSILLECTOMY  1985   VEIN REPAIR Left  07/26/2018   Procedure: STENT LEFT AXILLARY VEIN;  Surgeon: Sheree Penne Bruckner, MD;  Location: [facility] OR;  Service: Vascular;  Laterality: Left;   Family History  Family History  Problem Relation Age of Onset   Prostate cancer Paternal Grandfather    Diabetes Mother    Heart disease Mother    Transient ischemic attack Mother    Diabetes Sister    Diabetes Maternal Grandfather    Colon cancer Neg Hx    Social History  reports that he has been smoking cigarettes. He started smoking about 52 years ago. He has a 13.2 pack-year smoking history. He has been exposed to tobacco smoke. He has never used smokeless tobacco. He reports that he does not currently use alcohol after a past usage of about 2.0 standard drinks of alcohol per week. He reports that he does not use drugs. Allergies  Allergies  Allergen Reactions   Lipitor [Atorvastatin] Other (See Comments)    pain   Home medications Prior to Admission medications   Medication Sig Start Date End Date Taking? Authorizing Provider  acetaminophen  (TYLENOL ) 650 MG CR tablet Take 1,300 mg by mouth every 8 (eight) hours as needed for pain. 12/29/17  Yes [provider]  amLODipine  (NORVASC ) 2.5 MG tablet Take 1 tablet (2.5 mg total) by mouth daily. 05/23/23 05/22/24 Yes Norleen Lynwood ORN, MD  aspirin  81 MG EC tablet Take 81 mg by mouth daily. 12/29/17  Yes [provider]  cinacalcet  (SENSIPAR ) 60 MG tablet Take 120 mg by mouth daily. 09/13/23  Yes [provider]  diphenhydrAMINE (BENADRYL) 25 mg capsule Take 75 mg by mouth daily as needed for itching. 07/31/19  Yes [provider]  Doxercalciferol  (HECTOROL  IV) Doxercalciferol  (Hectorol ) 10/15/23 10/13/24 Yes [provider]  Dulaglutide  (TRULICITY ) 1.5 MG/0.5ML SOAJ Inject 1.5 mg into the skin once a week. 03/09/24  Yes Norleen Lynwood ORN, MD  losartan (COZAAR) 50 MG tablet Take 50 mg by mouth daily.   Yes [provider]  Methoxy PEG-Epoetin  Beta  (MIRCERA IJ) 75 mcg. 11/15/23 11/13/24 Yes [provider]  metoprolol  succinate (TOPROL -XL) 50 MG 24 hr tablet TAKE 1 TABLET BY MOUTH EVERY DAY  WITH OR IMMEDIATELY FOLLOWING A MEAL 03/24/23  Yes Norleen Lynwood ORN, MD  rosuvastatin  (CRESTOR ) 20 MG tablet TAKE 1 TABLET BY MOUTH EVERY DAY 03/24/23  Yes Norleen Lynwood ORN, MD  VELPHORO 500 MG chewable tablet Chew 1,500 mg by mouth daily. 09/20/22  Yes [provider]  glucose blood (ONE TOUCH ULTRA TEST) test strip Use as instructed once daily E11.9 10/26/17   Norleen Lynwood ORN, MD  Lancets [facility] ULTRASOFT) lancets 1 each by Other route 2 (two) times daily. Use to check blood sugars once a day Dx e11.9 10/26/17   Norleen Lynwood ORN, MD     Vitals:   04/13/24 1000 04/13/24 1031 04/13/24 1241 04/13/24 1247  BP: (!) 106/58 (!) 140/71 126/65 117/62  Pulse: 76 67 71 68  Resp: (!) 8 12 12 11   Temp:  97.7 F (36.5 C) (!) 97.2 F (36.2 C)   TempSrc:  Oral  SpO2: 98% 98% 99% 100%  Weight:      Height:  6' (1.829 m)     Exam Gen alert, no distress, on RA, stable Sclera anicteric, throat clear  No jvd or bruits Chest clear bilat to bases RRR no MRG Abd soft ntnd no mass or ascites +bs Ext no  LE or UE edema MS L arm wrapped Neuro is alert, Ox 3 , nf    L chest new TDC   Home bp meds: Norvasc  2.5 every day Losartan 50mg  every day Metoprolol  xl 50mg  every day  Assessment/ Plan: Hyperkalemia: got temporizing measures post-op, plan is for 3 hr HD w/ of low K+ bath. Pt can be dc'd after dialysis.  ESRD: on HD TTS. HD today off schedule for #1.  HTN: bp's are stable, resuming home meds prn Volume: no vol excess on exam, on RA. UF goal 2.5L.  Anemia of esrd: Hb 11-12 here, no esa needs.      Christopher Fret  MD [facility] 04/13/2024, 1:09 PM  Recent Labs  Lab 04/13/24 0655 04/13/24 0949  HGB 11.6* 11.6*  CALCIUM  9.4  --   CREATININE 10.32*  11.20* 10.30*  K 6.5*  6.5* 5.4*   Inpatient medications:  amLODipine   2.5 mg Oral Daily    aspirin  EC  81 mg Oral Daily   Chlorhexidine  Gluconate Cloth  6 each Topical Q0600   [START ON 04/14/2024] cinacalcet   120 mg Oral Q breakfast   insulin  aspart  0-15 Units Subcutaneous TID WC   losartan  50 mg Oral Daily   [START ON 04/14/2024] metoprolol  succinate  50 mg Oral Daily   pantoprazole  40 mg Oral Daily   rosuvastatin   20 mg Oral Daily   [START ON 04/14/2024] sucroferric oxyhydroxide  1,500 mg Oral Q breakfast    anticoagulant sodium citrate     acetaminophen  **OR** acetaminophen , alteplase , alum & mag hydroxide-simeth, anticoagulant sodium citrate, diphenhydrAMINE, feeding supplement (NEPRO CARB STEADY), guaiFENesin-dextromethorphan, heparin , hydrALAZINE , HYDROmorphone (DILAUDID) injection, labetalol , lidocaine  (PF), lidocaine -prilocaine, metoprolol  tartrate, ondansetron , oxyCODONE -acetaminophen , pentafluoroprop-tetrafluoroeth, phenol, polyethylene glycol

## 2024-04-13 NOTE — Interval H&P Note (Signed)
 History and Physical Interval Note:  04/13/2024 7:17 AM  Christopher Lopez  has presented today for surgery, with the diagnosis of ESRD.  The various methods of treatment have been discussed with the patient and family. After consideration of risks, benefits and other options for treatment, the patient has consented to  Procedure(s): INSERTION OF DIALYSIS CATHETER (N/A) REVISION OF ARTERIOVENOUS GORETEX GRAFT (Left) as a surgical intervention.  The patient's history has been reviewed, patient examined, no change in status, stable for surgery.  I have reviewed the patient's chart and labs.  Questions were answered to the patient's satisfaction.     [name]

## 2024-04-13 NOTE — Anesthesia Postprocedure Evaluation (Signed)
 Anesthesia Post Note  Patient: Christopher Lopez  Procedure(s) Performed: INSERTION OF DIALYSIS CATHETER USING PALINDROME 23CM CATHETER (Left: Chest) EXCISION AND LIGATION OF ARTERIOVENOUS GORETEX GRAFT (Left: Arm Upper)     Patient location during evaluation: PACU Anesthesia Type: General Level of consciousness: awake and alert Pain management: pain level controlled Vital Signs Assessment: post-procedure vital signs reviewed and stable Respiratory status: spontaneous breathing, nonlabored ventilation and respiratory function stable Cardiovascular status: blood pressure returned to baseline Postop Assessment: no apparent nausea or vomiting Anesthetic complications: no Comments: Repeat K after insulin  in PACU is 5.4. Surgeon planning to consult nephrology regarding whether patient will need dialysis as inpatient before his scheduled outpatient dialysis tomorrow. Lawence, MD   No notable events documented.  Last Vitals:  Vitals:   04/13/24 1000 04/13/24 1031  BP: (!) 106/58 (!) 140/71  Pulse: 76 67  Resp: (!) 8 12  Temp:  36.5 C  SpO2: 98% 98%    Last Pain:  Vitals:   04/13/24 1032  TempSrc:   PainSc: 0-No pain                 Vertell Row

## 2024-04-13 NOTE — Progress Notes (Signed)
   04/13/24 1600  Vitals  Temp 98.3 F (36.8 C)  Pulse Rate 81  Resp 12  BP 124/65  SpO2 100 %  O2 Device Room Air  Weight 73.9 kg  Type of Weight Post-Dialysis  Post Treatment  Dialyzer Clearance Lightly streaked  Hemodialysis Intake (mL) 0 mL  Liters Processed 72  Fluid Removed (mL) 0 mL  Tolerated HD Treatment Yes   Received patient in bed to unit.  Alert and oriented.  Informed consent signed and in chart.   TX duration:3.0 per dx order  Patient tolerated well.  Transported back to the room  Alert, without acute distress.  Hand-off given to patient's nurse.   Access used: [facility] Access issues: lines reversed at the onset of the tx  Total UF removed: 0 Medication(s) given: none   Christopher Lopez [facility]

## 2024-04-14 ENCOUNTER — Encounter (HOSPITAL_COMMUNITY): Payer: Self-pay | Admitting: Vascular Surgery

## 2024-04-14 NOTE — Discharge Summary (Signed)
  Discharge Summary  Patient ID: Christopher Lopez [id number] 63 y.o. 1961/04/04  Admit date: 04/13/2024  Discharge date and time: 04/13/2024  8:00 PM   Admitting Physician: Christopher Lonni Colorado, MD   Discharge Physician: same  Admission Diagnoses: End stage renal disease (HCC) [N18.6] ESRD (end stage renal disease) (HCC) [N18.6]  Discharge Diagnoses: same  Admission Condition: fair  Discharged Condition: fair  Indication for Admission: post op care; dialysis  Hospital Course: Christopher Lopez is a 63 year old male who was brought in as an outpatient and underwent left arm AV graft ligation and excision as well as left IJ TDC placement by Dr. Colorado on 04/13/2024.  Perioperatively he was hyperkalemic.  Nephrology was consulted to arrange inpatient hemodialysis after surgery.  He tolerated his HD treatment and preferred to be discharged home with his brother.  He will follow-up in the office with a right arm vein mapping and for staple removal in several weeks.  He was discharged home in stable condition.  Consults: nephrology  Treatments: surgery: L arm AVG ligation and excision and TDC placement 04/13/24  Discharge Exam: L hand well perfused; palpable radial pulse; dressing left in place L arm Vitals:   04/13/24 1545 04/13/24 1600  BP: 120/64 124/65  Pulse: 82 81  Resp: 13 12  Temp:  98.3 F (36.8 C)  SpO2: 99% 100%     Disposition: Discharge disposition: 01-Home or Self Care       Patient Instructions:  Allergies as of 04/13/2024       Reactions   Lipitor [atorvastatin] Other (See Comments)   pain        Medication List     TAKE these medications    acetaminophen  650 MG CR tablet Commonly known as: TYLENOL  Take 1,300 mg by mouth every 8 (eight) hours as needed for pain.   amLODipine  2.5 MG tablet Commonly known as: NORVASC  Take 1 tablet (2.5 mg total) by mouth daily.   aspirin  EC 81 MG tablet Take 81 mg by mouth daily.   cinacalcet  60 MG  tablet Commonly known as: SENSIPAR  Take 120 mg by mouth daily.   diphenhydrAMINE 25 mg capsule Commonly known as: BENADRYL Take 75 mg by mouth daily as needed for itching.   glucose blood test strip Commonly known as: ONE TOUCH ULTRA TEST Use as instructed once daily E11.9   HECTOROL  IV Doxercalciferol  (Hectorol )   losartan 50 MG tablet Commonly known as: COZAAR Take 50 mg by mouth daily.   metoprolol  succinate 50 MG 24 hr tablet Commonly known as: TOPROL -XL TAKE 1 TABLET BY MOUTH EVERY DAY  WITH OR IMMEDIATELY FOLLOWING A MEAL   MIRCERA IJ 75 mcg.   onetouch ultrasoft lancets 1 each by Other route 2 (two) times daily. Use to check blood sugars once a day Dx e11.9   oxyCODONE -acetaminophen  5-325 MG tablet Commonly known as: PERCOCET/ROXICET Take 1 tablet by mouth every 6 (six) hours as needed for moderate pain (pain score 4-6).   rosuvastatin  20 MG tablet Commonly known as: CRESTOR  TAKE 1 TABLET BY MOUTH EVERY DAY   Trulicity  1.5 MG/0.5ML Soaj Generic drug: Dulaglutide  Inject 1.5 mg into the skin once a week.   Velphoro 500 MG chewable tablet Generic drug: sucroferric oxyhydroxide Chew 1,500 mg by mouth daily.       Activity: activity as tolerated Diet: renal diet Wound Care: keep wound clean and dry  Follow-up with [facility] in 3 weeks.  SignedBETHA Donnice Sender, PA-C 04/14/2024 8:07 AM [facility] Office: [phone]

## 2024-04-15 ENCOUNTER — Telehealth: Payer: Self-pay | Admitting: Nephrology

## 2024-04-15 NOTE — Telephone Encounter (Signed)
 Transition of Care Contact from Inpatient Facility   Date of Discharge: 04/13/24 Date of Contact: 04/15/24 Method of contact: phone Talked to patient   Patient contacted to discuss transition of care form recent hospitaliztion. Patient was admitted to [facility] from 10/3 to 04/13/24 with the discharge diagnosis of hyperkalemia,  ligation and excision of LU AVG.    Medication changes were reviewed.  Patient will follow up with is outpatient dialysis center 04/17/24.  Other follow up needs include none identified.   Manuelita Labella, PA-C [employer]

## 2024-04-16 NOTE — Progress Notes (Signed)
 Late note entry 04/16/24 0846 D/c noted, contacted out-pt HD [place] [facility] to inform of pt d/c and to anticipate Tuesday. No further assistance needed.   Lavanda Devonn Giampietro Dialysis navigator [phone]

## 2024-04-17 ENCOUNTER — Ambulatory Visit: Payer: Self-pay | Admitting: Internal Medicine

## 2024-04-17 LAB — HEPATITIS B SURFACE ANTIBODY, QUANTITATIVE: Hep B S AB Quant (Post): 129 m[IU]/mL

## 2024-04-20 ENCOUNTER — Other Ambulatory Visit: Payer: Self-pay | Admitting: Internal Medicine

## 2024-04-24 ENCOUNTER — Other Ambulatory Visit: Payer: Self-pay | Admitting: Vascular Surgery

## 2024-04-24 DIAGNOSIS — T82590A Other mechanical complication of surgically created arteriovenous fistula, initial encounter: Secondary | ICD-10-CM

## 2024-04-24 DIAGNOSIS — N186 End stage renal disease: Secondary | ICD-10-CM

## 2024-05-02 ENCOUNTER — Ambulatory Visit: Admitting: Vascular Surgery

## 2024-05-16 ENCOUNTER — Ambulatory Visit (HOSPITAL_BASED_OUTPATIENT_CLINIC_OR_DEPARTMENT_OTHER)
Admit: 2024-05-16 | Discharge: 2024-05-16 | Disposition: A | Discharge status: Home / Self Care | Attending: Vascular Surgery | Admitting: Vascular Surgery

## 2024-05-16 ENCOUNTER — Ambulatory Visit (HOSPITAL_COMMUNITY)
Admission: RE | Admit: 2024-05-16 | Discharge: 2024-05-16 | Disposition: A | Discharge status: Home / Self Care | Source: Ambulatory Visit | Attending: Vascular Surgery | Admitting: Vascular Surgery

## 2024-05-16 ENCOUNTER — Ambulatory Visit (INDEPENDENT_AMBULATORY_CARE_PROVIDER_SITE_OTHER): Admitting: Physician Assistant

## 2024-05-16 VITALS — BP 169/90 | HR 103 | Temp 98.7°F | Wt 162.9 lb

## 2024-05-16 DIAGNOSIS — N186 End stage renal disease: Secondary | ICD-10-CM | POA: Diagnosis present

## 2024-05-16 DIAGNOSIS — T82590A Other mechanical complication of surgically created arteriovenous fistula, initial encounter: Secondary | ICD-10-CM | POA: Diagnosis present

## 2024-05-16 NOTE — Progress Notes (Signed)
 POST OPERATIVE OFFICE NOTE    CC:  F/u for surgery  HPI: Christopher Lopez is a 63 y.o. male who is here for postop visit.  He recently underwent placement of a left IJ TDC and ligation and excision of a left upper extremity AV graft on 04/13/2024 by Dr. Sheree.  This was done for a degenerated AV graft with risk of rupture.  He returns today for follow-up.  He has no complaints at today's office visit.  He denies any issues with his left arm incision such as drainage, tenderness, or dehiscence.  He denies any weakness or numbness in the left hand.  He has been dialyzing successfully through his left IJ TDC.  He dialyzes on T/Th/Sat.   Allergies  Allergen Reactions   Lipitor [Atorvastatin] Other (See Comments)    pain    Current Outpatient Medications  Medication Sig Dispense Refill   acetaminophen  (TYLENOL ) 650 MG CR tablet Take 1,300 mg by mouth every 8 (eight) hours as needed for pain.     amLODipine  (NORVASC ) 2.5 MG tablet Take 1 tablet (2.5 mg total) by mouth daily. 90 tablet 3   aspirin  81 MG EC tablet Take 81 mg by mouth daily.     cinacalcet  (SENSIPAR ) 60 MG tablet Take 120 mg by mouth daily.     diphenhydrAMINE (BENADRYL) 25 mg capsule Take 75 mg by mouth daily as needed for itching.     Doxercalciferol  (HECTOROL  IV) Doxercalciferol  (Hectorol )     Dulaglutide  (TRULICITY ) 1.5 MG/0.5ML SOAJ Inject 1.5 mg into the skin once a week. 6 mL 3   glucose blood (ONE TOUCH ULTRA TEST) test strip Use as instructed once daily E11.9 100 each 12   Lancets (ONETOUCH ULTRASOFT) lancets 1 each by Other route 2 (two) times daily. Use to check blood sugars once a day Dx e11.9 100 each 3   losartan (COZAAR) 50 MG tablet Take 50 mg by mouth daily.     Methoxy PEG-Epoetin  Beta (MIRCERA IJ) 75 mcg.     metoprolol  succinate (TOPROL -XL) 50 MG 24 hr tablet TAKE 1 TABLET BY MOUTH EVERY DAY  WITH OR IMMEDIATELY FOLLOWING A MEAL 90 tablet 3   oxyCODONE -acetaminophen  (PERCOCET/ROXICET) 5-325 MG tablet Take 1  tablet by mouth every 6 (six) hours as needed for moderate pain (pain score 4-6). 15 tablet 0   rosuvastatin  (CRESTOR ) 20 MG tablet TAKE 1 TABLET BY MOUTH EVERY DAY 90 tablet 3   VELPHORO 500 MG chewable tablet Chew 1,500 mg by mouth daily.     No current facility-administered medications for this visit.     ROS:  See HPI  Physical Exam:  Incision: Left upper arm incision well-healed without signs of infection or dehiscence, all staples removed Extremities: Palpable radial pulses bilaterally Neuro: Intact motor and sensation of bilateral upper extremities  Studies: Vein mapping RUE: +-----------------+-------------+----------+-------------------------+  Right Cephalic   Diameter (cm)Depth (cm)        Findings           +-----------------+-------------+----------+-------------------------+  Shoulder            0.16        0.73                              +-----------------+-------------+----------+-------------------------+  Prox upper arm       0.15        0.66                              +-----------------+-------------+----------+-------------------------+  Mid upper arm        0.14        0.55                              +-----------------+-------------+----------+-------------------------+  Dist upper arm       0.11        0.47                              +-----------------+-------------+----------+-------------------------+  Antecubital fossa    0.14        0.50                              +-----------------+-------------+----------+-------------------------+  Prox forearm         0.11        0.31   possible chronic thrombus  +-----------------+-------------+----------+-------------------------+  Mid forearm          0.08        0.39   possible chronic thrombus  +-----------------+-------------+----------+-------------------------+  Dist forearm         0.11        0.40                               +-----------------+-------------+----------+-------------------------+   +-----------------+-------------+----------+--------+  Right Basilic    Diameter (cm)Depth (cm)Findings  +-----------------+-------------+----------+--------+  Mid upper arm        0.23        0.71             +-----------------+-------------+----------+--------+  Dist upper arm       0.24        0.53             +-----------------+-------------+----------+--------+  Antecubital fossa    0.20        0.41             +-----------------+-------------+----------+--------+     Assessment/Plan:  This is a 63 y.o. male who is here for postop visit  - The patient recently underwent placement of a left IJ TDC and ligation and excision of a left upper arm AV graft.  His left upper arm incision is well-healed without signs of infection or dehiscence.  I have removed all of his staples today -He is successfully dialyzing on Tuesdays, Thursdays, and Saturdays via left IJ TDC -Bilateral upper extremities remain well-perfused with palpable radial pulses.  He has intact motor and sensation of bilateral upper extremities -The patient appears to have a very small right cephalic vein that is not suitable for fistula creation.  His right basilic vein also appears marginal and likely unsuitable for fistula creation.  I have discussed with the patient that further dialysis access in the right upper extremity would likely be creation of an arteriovenous graft.  We will reevaluate his veins on the right in the OR.  He is agreeable to this. - I have discussed the risk versus benefits of surgery including risk of infection, bleeding, future revision, and steal.  He is agreeable to pursue further access in the right arm.  We will plan for right upper extremity arteriovenous fistula versus graft creation within the next couple of weeks with Dr. Sheree.   [name], PA-C [facility] [phone]   Clinic MD:  Sheree

## 2024-05-16 NOTE — H&P (View-Only) (Signed)
 POST OPERATIVE OFFICE NOTE    CC:  F/u for surgery  HPI: Christopher Lopez is a 63 y.o. male who is here for postop visit.  He recently underwent placement of a left IJ TDC and ligation and excision of a left upper extremity AV graft on 04/13/2024 by Dr. Sheree.  This was done for a degenerated AV graft with risk of rupture.  He returns today for follow-up.  He has no complaints at today's office visit.  He denies any issues with his left arm incision such as drainage, tenderness, or dehiscence.  He denies any weakness or numbness in the left hand.  He has been dialyzing successfully through his left IJ TDC.  He dialyzes on T/Th/Sat.   Allergies  Allergen Reactions   Lipitor [Atorvastatin] Other (See Comments)    pain    Current Outpatient Medications  Medication Sig Dispense Refill   acetaminophen  (TYLENOL ) 650 MG CR tablet Take 1,300 mg by mouth every 8 (eight) hours as needed for pain.     amLODipine  (NORVASC ) 2.5 MG tablet Take 1 tablet (2.5 mg total) by mouth daily. 90 tablet 3   aspirin  81 MG EC tablet Take 81 mg by mouth daily.     cinacalcet  (SENSIPAR ) 60 MG tablet Take 120 mg by mouth daily.     diphenhydrAMINE (BENADRYL) 25 mg capsule Take 75 mg by mouth daily as needed for itching.     Doxercalciferol  (HECTOROL  IV) Doxercalciferol  (Hectorol )     Dulaglutide  (TRULICITY ) 1.5 MG/0.5ML SOAJ Inject 1.5 mg into the skin once a week. 6 mL 3   glucose blood (ONE TOUCH ULTRA TEST) test strip Use as instructed once daily E11.9 100 each 12   Lancets (ONETOUCH ULTRASOFT) lancets 1 each by Other route 2 (two) times daily. Use to check blood sugars once a day Dx e11.9 100 each 3   losartan (COZAAR) 50 MG tablet Take 50 mg by mouth daily.     Methoxy PEG-Epoetin  Beta (MIRCERA IJ) 75 mcg.     metoprolol  succinate (TOPROL -XL) 50 MG 24 hr tablet TAKE 1 TABLET BY MOUTH EVERY DAY  WITH OR IMMEDIATELY FOLLOWING A MEAL 90 tablet 3   oxyCODONE -acetaminophen  (PERCOCET/ROXICET) 5-325 MG tablet Take 1  tablet by mouth every 6 (six) hours as needed for moderate pain (pain score 4-6). 15 tablet 0   rosuvastatin  (CRESTOR ) 20 MG tablet TAKE 1 TABLET BY MOUTH EVERY DAY 90 tablet 3   VELPHORO 500 MG chewable tablet Chew 1,500 mg by mouth daily.     No current facility-administered medications for this visit.     ROS:  See HPI  Physical Exam:  Incision: Left upper arm incision well-healed without signs of infection or dehiscence, all staples removed Extremities: Palpable radial pulses bilaterally Neuro: Intact motor and sensation of bilateral upper extremities  Studies: Vein mapping RUE: +-----------------+-------------+----------+-------------------------+  Right Cephalic   Diameter (cm)Depth (cm)        Findings           +-----------------+-------------+----------+-------------------------+  Shoulder            0.16        0.73                              +-----------------+-------------+----------+-------------------------+  Prox upper arm       0.15        0.66                              +-----------------+-------------+----------+-------------------------+  Mid upper arm        0.14        0.55                              +-----------------+-------------+----------+-------------------------+  Dist upper arm       0.11        0.47                              +-----------------+-------------+----------+-------------------------+  Antecubital fossa    0.14        0.50                              +-----------------+-------------+----------+-------------------------+  Prox forearm         0.11        0.31   possible chronic thrombus  +-----------------+-------------+----------+-------------------------+  Mid forearm          0.08        0.39   possible chronic thrombus  +-----------------+-------------+----------+-------------------------+  Dist forearm         0.11        0.40                               +-----------------+-------------+----------+-------------------------+   +-----------------+-------------+----------+--------+  Right Basilic    Diameter (cm)Depth (cm)Findings  +-----------------+-------------+----------+--------+  Mid upper arm        0.23        0.71             +-----------------+-------------+----------+--------+  Dist upper arm       0.24        0.53             +-----------------+-------------+----------+--------+  Antecubital fossa    0.20        0.41             +-----------------+-------------+----------+--------+     Assessment/Plan:  This is a 63 y.o. male who is here for postop visit  - The patient recently underwent placement of a left IJ TDC and ligation and excision of a left upper arm AV graft.  His left upper arm incision is well-healed without signs of infection or dehiscence.  I have removed all of his staples today -He is successfully dialyzing on Tuesdays, Thursdays, and Saturdays via left IJ TDC -Bilateral upper extremities remain well-perfused with palpable radial pulses.  He has intact motor and sensation of bilateral upper extremities -The patient appears to have a very small right cephalic vein that is not suitable for fistula creation.  His right basilic vein also appears marginal and likely unsuitable for fistula creation.  I have discussed with the patient that further dialysis access in the right upper extremity would likely be creation of an arteriovenous graft.  We will reevaluate his veins on the right in the OR.  He is agreeable to this. - I have discussed the risk versus benefits of surgery including risk of infection, bleeding, future revision, and steal.  He is agreeable to pursue further access in the right arm.  We will plan for right upper extremity arteriovenous fistula versus graft creation within the next couple of weeks with Dr. Sheree.   Christopher Holster, PA-C [facility] [phone]   Clinic MD:  Sheree

## 2024-05-17 ENCOUNTER — Other Ambulatory Visit: Payer: Self-pay

## 2024-05-17 DIAGNOSIS — N186 End stage renal disease: Secondary | ICD-10-CM

## 2024-05-23 ENCOUNTER — Encounter (HOSPITAL_COMMUNITY): Payer: Self-pay | Admitting: Vascular Surgery

## 2024-05-23 ENCOUNTER — Other Ambulatory Visit: Payer: Self-pay

## 2024-05-23 NOTE — Progress Notes (Addendum)
 PCP - Norleen Lynwood ORN, MD  Cardiologist - denies  PPM/ICD - denies Device Orders - n/a Rep Notified - n/a  Chest x-ray - 04-13-24 EKG - 04-13-24 Stress Test - denies ECHO - 07-17-2018 Cardiac Cath - denies  CPAP - denies  GLP-1 -Dulaglutide  (TRULICITY ) Last dose 05-13-24  Fasting Blood Sugar - Per patient's brother blood sugar ranges from 120-130 Checks Blood Sugar daily  Blood Thinner Instructions: denies Aspirin  Instructions: n/a  ERAS Protcol - NPO  COVID TEST- n/a  Anesthesia review: no  Information obtained via brother who is his legal guardian  Patient verbally denies any shortness of breath, fever, cough and chest pain during phone call   -------------  SDW INSTRUCTIONS given:  Your procedure is scheduled on May 28, 2024.  Report to [facility] Main Entrance A at 10:45 A.M., and check in at the Admitting office.  Call this number if you have problems the morning of surgery:  [phone]   Remember:  Do not eat or drink after midnight the night before your surgery      Take these medicines the morning of surgery with A SIP OF WATER  acetaminophen  (TYLENOL )  amLODipine  (NORVASC )  aspirin   cinacalcet  (SENSIPAR )  metoprolol  succinate (TOPROL -XL)  rosuvastatin  (CRESTOR )   As of today, STOP taking any  (unless otherwise instructed by your surgeon) Aleve, Naproxen, Ibuprofen, Motrin, Advil, Goody's, BC's, all herbal medications, fish oil, and all vitamins.                      Do not wear jewelry, make up, or nail polish            Do not wear lotions, powders, perfumes/colognes, or deodorant.            Do not shave 48 hours prior to surgery.  Men may shave face and neck.            Do not bring valuables to the hospital.            [facility] is not responsible for any belongings or valuables.  Do NOT Smoke (Tobacco/Vaping) 24 hours prior to your procedure If you use a CPAP at night, you may bring all equipment for your overnight stay.   Contacts,  glasses, dentures or bridgework may not be worn into surgery.      For patients admitted to the hospital, discharge time will be determined by your treatment team.   Patients discharged the day of surgery will not be allowed to drive home, and someone needs to stay with them for 24 hours.    Special instructions:   [place]- Preparing For Surgery  Before surgery, you can play an important role. Because skin is not sterile, your skin needs to be as free of germs as possible. You can reduce the number of germs on your skin by washing with CHG (chlorahexidine gluconate) Soap before surgery.  CHG is an antiseptic cleaner which kills germs and bonds with the skin to continue killing germs even after washing.    Oral Hygiene is also important to reduce your risk of infection.  Remember - BRUSH YOUR TEETH THE MORNING OF SURGERY WITH YOUR REGULAR TOOTHPASTE  Please do not use if you have an allergy to CHG or antibacterial soaps. If your skin becomes reddened/irritated stop using the CHG.  Do not shave (including legs and underarms) for at least 48 hours prior to first CHG shower. It is OK to shave your face.  Please follow these instructions carefully.   Shower the NIGHT BEFORE SURGERY and the MORNING OF SURGERY with DIAL  Soap.   Pat yourself dry with a CLEAN TOWEL.  Wear CLEAN PAJAMAS to bed the night before surgery  Place CLEAN SHEETS on your bed the night of your first shower and DO NOT SLEEP WITH PETS.   Day of Surgery: Please shower morning of surgery  Wear Clean/Comfortable clothing the morning of surgery Do not apply any deodorants/lotions.   Remember to brush your teeth WITH YOUR REGULAR TOOTHPASTE.   Questions were answered. Patient verbalized understanding of instructions.

## 2024-05-24 NOTE — Progress Notes (Signed)
------------------------------------------  [facility] PROCEDURAL EXPEDITER NOTE-------------------------------------------------  Patient Name: Christopher Lopez Patient DOB: September 30, 1960 Today's Date: @TODAY @   Chart reviewed:  Yes  Documentation gaps: n/a Orders in place:  Yes  Communication with surgical team if no orders: n/a Labs, test, and orders reviewed: yes Requires surgical clearance:  No What type of clearance: n/a Clearance received: n/a Patient status:pre op   Barriers noted:n/a  Intervention provided by [facility] team: n/a Barrier resolved:  Yes   Ronal Bald, RN [employer]

## 2024-05-25 ENCOUNTER — Encounter (HOSPITAL_COMMUNITY): Payer: Self-pay | Admitting: Certified Registered Nurse Anesthetist

## 2024-05-25 ENCOUNTER — Encounter (HOSPITAL_COMMUNITY): Payer: Self-pay | Admitting: Vascular Surgery

## 2024-05-25 ENCOUNTER — Other Ambulatory Visit: Payer: Self-pay

## 2024-05-25 ENCOUNTER — Encounter (HOSPITAL_COMMUNITY): Admission: RE | Disposition: A | Discharge status: Home / Self Care | Payer: Self-pay | Source: Home / Self Care

## 2024-05-25 ENCOUNTER — Ambulatory Visit (HOSPITAL_COMMUNITY)
Admission: RE | Admit: 2024-05-25 | Discharge: 2024-05-25 | Disposition: A | Discharge status: Home / Self Care | Attending: Vascular Surgery | Admitting: Vascular Surgery

## 2024-05-25 DIAGNOSIS — N186 End stage renal disease: Secondary | ICD-10-CM | POA: Insufficient documentation

## 2024-05-25 DIAGNOSIS — F17210 Nicotine dependence, cigarettes, uncomplicated: Secondary | ICD-10-CM | POA: Insufficient documentation

## 2024-05-25 DIAGNOSIS — I120 Hypertensive chronic kidney disease with stage 5 chronic kidney disease or end stage renal disease: Secondary | ICD-10-CM | POA: Insufficient documentation

## 2024-05-25 DIAGNOSIS — E875 Hyperkalemia: Secondary | ICD-10-CM | POA: Insufficient documentation

## 2024-05-25 DIAGNOSIS — Z992 Dependence on renal dialysis: Secondary | ICD-10-CM | POA: Diagnosis not present

## 2024-05-25 DIAGNOSIS — E1122 Type 2 diabetes mellitus with diabetic chronic kidney disease: Secondary | ICD-10-CM | POA: Diagnosis not present

## 2024-05-25 DIAGNOSIS — Z538 Procedure and treatment not carried out for other reasons: Secondary | ICD-10-CM | POA: Insufficient documentation

## 2024-05-25 DIAGNOSIS — Z7985 Long-term (current) use of injectable non-insulin antidiabetic drugs: Secondary | ICD-10-CM | POA: Insufficient documentation

## 2024-05-25 DIAGNOSIS — D631 Anemia in chronic kidney disease: Secondary | ICD-10-CM | POA: Diagnosis not present

## 2024-05-25 LAB — POCT I-STAT, CHEM 8
BUN: 31 mg/dL — ABNORMAL HIGH (ref 8–23)
BUN: 25 mg/dL — ABNORMAL HIGH (ref 8–23)
Calcium, Ion: 1.14 mmol/L — ABNORMAL LOW (ref 1.15–1.40)
Calcium, Ion: 1.18 mmol/L (ref 1.15–1.40)
Chloride: 98 mmol/L (ref 98–111)
Chloride: 97 mmol/L — ABNORMAL LOW (ref 98–111)
Creatinine, Ser: 9 mg/dL — ABNORMAL HIGH (ref 0.61–1.24)
Creatinine, Ser: 9 mg/dL — ABNORMAL HIGH (ref 0.61–1.24)
Glucose, Bld: 104 mg/dL — ABNORMAL HIGH (ref 70–99)
Glucose, Bld: 99 mg/dL (ref 70–99)
HCT: 32 % — ABNORMAL LOW (ref 39.0–52.0)
HCT: 33 % — ABNORMAL LOW (ref 39.0–52.0)
Hemoglobin: 10.9 g/dL — ABNORMAL LOW (ref 13.0–17.0)
Hemoglobin: 11.2 g/dL — ABNORMAL LOW (ref 13.0–17.0)
Potassium: 7 mmol/L (ref 3.5–5.1)
Potassium: 6.9 mmol/L (ref 3.5–5.1)
Sodium: 134 mmol/L — ABNORMAL LOW (ref 135–145)
Sodium: 134 mmol/L — ABNORMAL LOW (ref 135–145)
TCO2: 31 mmol/L (ref 22–32)
TCO2: 30 mmol/L (ref 22–32)

## 2024-05-25 LAB — BASIC METABOLIC PANEL WITH GFR
Anion gap: 12 (ref 5–15)
BUN: 22 mg/dL (ref 8–23)
CO2: 29 mmol/L (ref 22–32)
Calcium: 10 mg/dL (ref 8.9–10.3)
Chloride: 95 mmol/L — ABNORMAL LOW (ref 98–111)
Creatinine, Ser: 9.14 mg/dL — ABNORMAL HIGH (ref 0.61–1.24)
GFR, Estimated: 6 mL/min — ABNORMAL LOW (ref >60)
Glucose, Bld: 98 mg/dL (ref 70–99)
Potassium: 6.8 mmol/L (ref 3.5–5.1)
Sodium: 136 mmol/L (ref 135–145)

## 2024-05-25 LAB — GLUCOSE, CAPILLARY
Glucose-Capillary: 99 mg/dL (ref 70–99)
Glucose-Capillary: 92 mg/dL (ref 70–99)
Glucose-Capillary: 98 mg/dL (ref 70–99)

## 2024-05-25 SURGERY — ARTERIOVENOUS (AV) FISTULA CREATION
Anesthesia: Choice | Laterality: Right

## 2024-05-25 MED ORDER — SODIUM ZIRCONIUM CYCLOSILICATE 10 G PO PACK
10.0000 g | PACK | ORAL | Status: AC
  Administered 2024-05-25: 10 g via ORAL
  Filled 2024-05-25: qty 1

## 2024-05-25 MED ORDER — CHLORHEXIDINE GLUCONATE 4 % EX SOLN: 60.0000 mL | Freq: Once | CUTANEOUS | Status: DC

## 2024-05-25 MED ORDER — CEFAZOLIN SODIUM-DEXTROSE 2-4 GM/100ML-% IV SOLN: 2.0000 g | INTRAVENOUS | Status: DC

## 2024-05-25 MED ORDER — ORAL CARE MOUTH RINSE: 15.0000 mL | Freq: Once | OROMUCOSAL | Status: DC

## 2024-05-25 MED ORDER — INSULIN ASPART 100 UNIT/ML IJ SOLN: 0.0000 [IU] | INTRAMUSCULAR | Status: DC | PRN

## 2024-05-25 MED ORDER — CHLORHEXIDINE GLUCONATE 0.12 % MT SOLN: 15.0000 mL | Freq: Once | OROMUCOSAL | Status: DC

## 2024-05-25 MED ORDER — METOPROLOL TARTRATE 5 MG/5ML IV SOLN
5.0000 mg | Freq: Once | INTRAVENOUS | Status: AC
  Administered 2024-05-25: 5 mg via INTRAVENOUS

## 2024-05-25 MED ORDER — SODIUM CHLORIDE 0.9 % IV SOLN: INTRAVENOUS | Status: DC

## 2024-05-25 NOTE — Progress Notes (Signed)
 K 7.0 and 6.9 on repeat ISTAT from two different sites. Dr. Patrisha made aware and stated that surgery is cancelled. Dr. Sheree notified. Family is requesting that dialysis be set up in the hospital today- Dr. Sheree made aware and is calling nephrology to see if we are able to do this.

## 2024-05-25 NOTE — ED Triage Notes (Signed)
 Pt came from dialysis today c/o high heart rate. Pt had 2L taken during dialysis and developed a high heart rate after. Pt was told to come to ED. Pt lives at home.

## 2024-05-25 NOTE — Progress Notes (Signed)
    Patient potassium returned at 6.9.  Discussed with nephrology and will need dialysis today and operation will be canceled.  Avigayil Ton C. Sheree, MD [facility] [place] Office: [phone] Pager: [phone]

## 2024-05-25 NOTE — Interval H&P Note (Signed)
 History and Physical Interval Note:  05/25/2024 11:11 AM  Christopher Lopez  has presented today for surgery, with the diagnosis of ESRD.  The various methods of treatment have been discussed with the patient and family. After consideration of risks, benefits and other options for treatment, the patient has consented to  Procedure(s): ARTERIOVENOUS (AV) FISTULA CREATION (Right) INSERTION, GRAFT, ARTERIOVENOUS, UPPER EXTREMITY (Right) as a surgical intervention.  The patient's history has been reviewed, patient examined, no change in status, stable for surgery.  I have reviewed the patient's chart and labs.  Questions were answered to the patient's satisfaction.     [name]

## 2024-05-25 NOTE — ED Provider Notes (Signed)
 [place] EMERGENCY DEPARTMENT AT [facility] Provider Note   CSN: [id number] Arrival date & time: 05/25/24  2201     Patient presents with: No chief complaint on file.   Christopher Lopez is a 63 y.o. male.   63 year old male with a history of diabetes, hypertension, hyperlipidemia, CVA (left lower extremity deficit), CKD, dementia, alcohol abuse presents to the emergency department from the inpatient floor.  He presented today for surgery for AV fistula creation and preop screening labs were notable for hyperkalemia.  His surgery was subsequently canceled and he was transferred to hemodialysis.  Also received 1 dose of Lokelma.  Was unable to be discharged from the hemodialysis floor and, therefore, was transferred to the emergency department for medical clearance.  The patient has no active complaints and denies chest pain, shortness of breath, lightheadedness, nausea, vomiting.  The history is provided by the patient and medical records. No language interpreter was used.       Prior to Admission medications   Medication Sig Start Date End Date Taking? Authorizing Provider  acetaminophen  (TYLENOL ) 500 MG tablet Take 1,000 mg by mouth every 6 (six) hours as needed for mild pain (pain score 1-3).   Yes [provider]  amLODipine  (NORVASC ) 2.5 MG tablet Take 1 tablet (2.5 mg total) by mouth daily. 05/23/23 05/22/24 Yes Norleen Lynwood ORN, MD  aspirin  81 MG EC tablet Take 81 mg by mouth daily. 12/29/17  Yes [provider]  cinacalcet  (SENSIPAR ) 60 MG tablet Take 60 mg by mouth 2 (two) times daily. 09/13/23  Yes [provider]  diphenhydrAMINE (BENADRYL) 25 mg capsule Take 75 mg by mouth daily as needed for itching. 07/31/19  Yes [provider]  Doxercalciferol  (HECTOROL  IV) Inject 10 mcg into the vein 3 (three) times a week. During dialysis (Tuesdays, Thursdays, and Saturdays) 10/15/23 10/13/24 Yes [provider]  Dulaglutide  (TRULICITY ) 1.5  MG/0.5ML SOAJ Inject 1.5 mg into the skin once a week. Patient taking differently: Inject 1.5 mg into the skin once a week. Sundays 03/09/24  Yes Norleen Lynwood ORN, MD  losartan (COZAAR) 50 MG tablet Take 50 mg by mouth daily.   Yes [provider]  Methoxy PEG-Epoetin  Beta (MIRCERA IJ) Inject 50 mcg as directed See admin instructions. Every 4 weeks during dialysis 11/15/23 11/13/24 Yes [provider]  metoprolol  succinate (TOPROL -XL) 50 MG 24 hr tablet TAKE 1 TABLET BY MOUTH EVERY DAY  WITH OR IMMEDIATELY FOLLOWING A MEAL 04/23/24  Yes Norleen Lynwood ORN, MD  rosuvastatin  (CRESTOR ) 20 MG tablet TAKE 1 TABLET BY MOUTH EVERY DAY 03/24/23  Yes Norleen Lynwood ORN, MD  VELPHORO 500 MG chewable tablet Chew 500 mg by mouth 3 (three) times daily with meals. 09/20/22  Yes [provider]  glucose blood (ONE TOUCH ULTRA TEST) test strip Use as instructed once daily E11.9 10/26/17   Norleen Lynwood ORN, MD  Lancets [facility] ULTRASOFT) lancets 1 each by Other route 2 (two) times daily. Use to check blood sugars once a day Dx e11.9 10/26/17   Norleen Lynwood ORN, MD    Allergies: Lipitor [atorvastatin]    Review of Systems Ten systems reviewed and are negative for acute change, except as noted in the HPI.    Updated Vital Signs BP 129/78 (BP Location: Left Wrist)   Pulse 93   Temp 98.2 F (36.8 C) (Oral)   Resp 14   Ht 6' (1.829 m)   Wt 78.5 kg   SpO2 100%  BMI 23.47 kg/m   Physical Exam Vitals and nursing note reviewed.  Constitutional:      General: He is not in acute distress.    Appearance: He is well-developed. He is not diaphoretic.     Comments: Nontoxic-appearing and in no acute distress  HENT:     Head: Normocephalic and atraumatic.  Eyes:     General: No scleral icterus.    Conjunctiva/sclera: Conjunctivae normal.  Cardiovascular:     Rate and Rhythm: Normal rate and regular rhythm.     Pulses: Normal pulses.     Comments: Not tachycardic as previously documented Pulmonary:      Effort: Pulmonary effort is normal. No respiratory distress.     Comments: Respirations even and unlabored Musculoskeletal:        General: Normal range of motion.     Cervical back: Normal range of motion.  Skin:    General: Skin is warm and dry.     Coloration: Skin is not pale.     Findings: No erythema or rash.  Neurological:     Mental Status: He is alert and oriented to person, place, and time.  Psychiatric:        Behavior: Behavior normal.     (all labs ordered are listed, but only abnormal results are displayed) Labs Reviewed  BASIC METABOLIC PANEL WITH GFR - Abnormal; Notable for the following components:      Result Value   Potassium 6.8 (*)    Chloride 95 (*)    Creatinine, Ser 9.14 (*)    GFR, Estimated 6 (*)    All other components within normal limits  POCT I-STAT, CHEM 8 - Abnormal; Notable for the following components:   Sodium 134 (*)    Potassium 7.0 (*)    BUN 31 (*)    Creatinine, Ser 9.00 (*)    Glucose, Bld 104 (*)    Calcium , Ion 1.14 (*)    Hemoglobin 10.9 (*)    HCT 32.0 (*)    All other components within normal limits  POCT I-STAT, CHEM 8 - Abnormal; Notable for the following components:   Sodium 134 (*)    Potassium 6.9 (*)    Chloride 97 (*)    BUN 25 (*)    Creatinine, Ser 9.00 (*)    Hemoglobin 11.2 (*)    HCT 33.0 (*)    All other components within normal limits  GLUCOSE, CAPILLARY  GLUCOSE, CAPILLARY  GLUCOSE, CAPILLARY  HEPATITIS B SURFACE ANTIGEN  HEPATITIS B SURFACE ANTIBODY, QUANTITATIVE    EKG: None  Radiology: No results found.   Procedures   Medications Ordered in the ED  sodium zirconium cyclosilicate (LOKELMA) packet 10 g (10 g Oral Given 05/25/24 1547)  metoprolol  tartrate (LOPRESSOR ) injection 5 mg (5 mg Intravenous Given 05/25/24 2053)                                    Medical Decision Making  This patient presents to the ED for concern of medical clearance, this involves an extensive number of  treatment options, and is a complaint that carries with it a high risk of complications and morbidity.     Co morbidities that complicate the patient evaluation  ESRD DM HTN Dementia   Additional history obtained:  Additional history obtained from inpatient nephrology notes External records from outside source obtained and reviewed including prior discharge summaries   Cardiac  Monitoring:  The patient was maintained on a cardiac monitor.  I personally viewed and interpreted the cardiac monitored which showed an underlying rhythm of: NSR   Medicines ordered and prescription drug management:  I have reviewed the patients home medicines and have made adjustments as needed   Test Considered:  Repeat potassium for trending.   Problem List / ED Course:  Transferred from HD floor for medical clearance.  No acute complaints. Reassuring vital signs. Has already undergone dialysis for hyperkalemia; also given Lokelma x1. No further emergent intervention deemed necessary at this time. Stable for discharge.   Social Determinants of Health:  Dementia   Dispostion:  After consideration of the diagnostic results and the patients response to treatment, I feel that the patent would benefit from ongoing outpatient f/u with VVS for eventual creation of new AV fistula. Return precautions discussed and provided. Patient discharged in stable condition with no unaddressed concerns.       Final diagnoses:  ESRD on dialysis [facility])    ED Discharge Orders     None          Keith Sor, PA-C 05/25/24 2241    Simon Lavonia SAILOR, MD 05/25/24 [phone]

## 2024-05-25 NOTE — ED Notes (Signed)
 Pt passed off to brother and nephew @23 :15 by this NT

## 2024-05-25 NOTE — Progress Notes (Signed)
   05/25/24 2115  During Treatment Monitoring  Blood Flow Rate (mL/min) 400 mL/min  Arterial Pressure (mmHg) 200 mmHg  Venous Pressure (mmHg) 200 mmHg  TMP (mmHg) 9 mmHg  Ultrafiltration Rate (mL/min) 920 mL/min  Dialysate Flow Rate (mL/min) 300 ml/min  Dialysate Potassium Concentration 2  Dialysate Calcium  Concentration 2.5  HD Safety Checks Performed Yes  Intra-Hemodialysis Comments Tx completed  Post Treatment  Dialyzer Clearance Lightly streaked  Liters Processed 60  Fluid Removed (mL) 2000 mL  Tolerated HD Treatment Yes  Post-Hemodialysis Comments  (metropolol administered due to HR 120)  Hemodialysis Catheter Left Internal jugular Double lumen Permanent (Tunneled)  Placement Date/Time: 04/13/24 0854   Placed prior to admission: No  Serial / Lot #: [id number]  Expiration Date: 12/09/28  Time Out: Correct patient;Correct site;Correct procedure  Maximum sterile barrier precautions: Hand hygiene;Cap;Sterile probe cov...  Site Condition No complications  Blue Lumen Status Flushed;Heparin  locked  Red Lumen Status Flushed;Heparin  locked  Catheter fill solution Heparin  1000 units/ml  Catheter fill volume (Arterial) 1.6 cc  Catheter fill volume (Venous) 1.6  Dressing Type Transparent  Dressing Status Antimicrobial disc/dressing in place  Interventions New dressing  Drainage Description None  Dressing Change Due 06/01/24  Post treatment catheter status Capped and Clamped   Heart rate resolve to NSR

## 2024-05-25 NOTE — Interval H&P Note (Signed)
 History and Physical Interval Note:  05/25/2024 11:10 AM  Christopher Lopez  has presented today for surgery, with the diagnosis of ESRD.  The various methods of treatment have been discussed with the patient and family. After consideration of risks, benefits and other options for treatment, the patient has consented to  Procedure(s): ARTERIOVENOUS (AV) FISTULA CREATION (Right) INSERTION, GRAFT, ARTERIOVENOUS, UPPER EXTREMITY (Right) as a surgical intervention.  The patient's history has been reviewed, patient examined, no change in status, stable for surgery.  I have reviewed the patient's chart and labs.  Questions were answered to the patient's satisfaction.     Christopher Lopez

## 2024-05-25 NOTE — ED Notes (Signed)
 This RN spoke to legal guardian, Dohn Stclair, about pts discharge. Legal guardian at ED entrance to receive pt after discharge.

## 2024-05-25 NOTE — Anesthesia Preprocedure Evaluation (Signed)
 Anesthesia Evaluation    Reviewed: Allergy & Precautions, Patient's Chart, lab work & pertinent test results  Airway        Dental   Pulmonary neg pulmonary ROS, Current Smoker          Cardiovascular hypertension, Pt. on home beta blockers and Pt. on medications      Neuro/Psych  Headaches CVA    GI/Hepatic negative GI ROS,,,(+)     substance abuse    Endo/Other  diabetes  Patient on GLP-1 Agonist  Renal/GU ESRFRenal disease     Musculoskeletal   Abdominal   Peds  Hematology negative hematology ROS (+)   Anesthesia Other Findings ESRD  Reproductive/Obstetrics                              Anesthesia Physical Anesthesia Plan Anesthesia Quick Evaluation

## 2024-05-25 NOTE — Progress Notes (Signed)
 Nephrology on-call Note: 63 y/o male whose AVF surgery was cancelled because of hyperkalemia. Pt underwent dialysis this evening. The HR improved with metoprolol . The RN said he can't discharge the patient from dialysis and there was some difficulty sending pt back to ER for evaluation before discharge.  I have called Dr. Sheree (attending on the chart) who said that the pt can go home after HD. I also called [facility] ER charge nurse about the situation that the pt needs to come to ER for evaluation before dc. I have informed above information to HD nurse. Dr. Dolan.

## 2024-05-25 NOTE — Consult Note (Signed)
 Renal Service Consult Note [facility] Lamar JONETTA Fret, MD  Patient: Christopher Lopez Date: 05/25/2024 Requesting Physician: Dr. Sheree  Reason for Consult: ESRD pt w/ hyperkalemia HPI: The patient is a 63 y.o. year-old w/ PMH as below who presented for elective HD access surgery but was found to have high K+ and the surgery was called off. We were asked to see the patient. The patient needs dialysis. He is on HD now upstairs. K+ was 6.9, EKG was w/o any QRS widening.    Pt seen in [facility]. No c/o's at this time. Doesn't drink OJ, nor take any tomato products, but loves white potato products. The same thing happened in early October here and he needed emergent HD then also for high K+ after an access procedure.    ROS - denies CP, no joint pain, no HA, no blurry vision, no rash, no diarrhea, no nausea/ vomiting   Past Medical History  Past Medical History:  Diagnosis Date   Alcohol abuse 09/18/2011   Allergic rhinitis, cause unspecified 09/20/2011   Anemia, unspecified 09/18/2011   Arthritis    Childhood asthma 09/20/2011   CKD (chronic kidney disease) stage 5, GFR less than 15 ml/min (HCC)    Dialysis - T/Th/Sa   Colitis 09/18/2011   Dementia (HCC) 09/18/2011   Diabetes mellitus    Diverticulosis of colon without hemorrhage 02/27/2014   Foot ulcer (HCC) 09/18/2011   High cholesterol    HTN (hypertension) 09/18/2011   Hyperlipidemia 11/08/2011   Hypertension    Impaired glucose tolerance 09/18/2011   Pneumonia 09/18/2011   Pre-ulcerative corn or callous 11/20/2011   Stroke (HCC)    left leg deficit   Type II or unspecified type diabetes mellitus without mention of complication, uncontrolled 09/20/2011   Wears glasses    Past Surgical History  Past Surgical History:  Procedure Laterality Date   A/V FISTULAGRAM Left 12/13/2022   Procedure: A/V Fistulagram;  Surgeon: Sheree Penne Bruckner, MD;  Location: [facility] INVASIVE CV LAB;  Service: Cardiovascular;   Laterality: Left;   AV FISTULA PLACEMENT Left 12/23/2017   Procedure: CREATION OF BASILIAC - CEPHALIC  ARTERIOVENOUS (AV) FISTULA  STAGE ONE;  Surgeon: Sheree Penne Bruckner, MD;  Location: [facility] OR;  Service: Vascular;  Laterality: Left;   AV FISTULA PLACEMENT Left 07/26/2018   Procedure: INSERTION OF ARTERIOVENOUS (AV) GORE-TEX GRAFT ARM;  Surgeon: Sheree Penne Bruckner, MD;  Location: [facility] OR;  Service: Vascular;  Laterality: Left;   BASCILIC VEIN TRANSPOSITION Left 03/01/2018   Procedure: BASILIC VEIN TRANSPOSITION SECOND STAGE;  Surgeon: Sheree Penne Bruckner, MD;  Location: [facility] OR;  Service: Vascular;  Laterality: Left;   INSERTION OF DIALYSIS CATHETER Right 12/23/2017   Procedure: INSERTION OF PALINDROME  DIALYSIS CATHETER;  Surgeon: Sheree Penne Bruckner, MD;  Location[facility] OR;  Service: Vascular;  Laterality: Right;   INSERTION OF DIALYSIS CATHETER Left 02/04/2023   Procedure: INSERTION OF TUNNELED DIALYSIS CATHETER;  Surgeon: Sheree Penne Bruckner, MD;  Location: [facility] OR;  Service: Vascular;  Laterality: Left;   INSERTION OF DIALYSIS CATHETER Left 04/13/2024   Procedure: INSERTION OF DIALYSIS CATHETER USING PALINDROME 23CM CATHETER;  Surgeon: Sheree Penne Bruckner, MD;  Location: [facility] OR;  Service: Vascular;  Laterality: Left;   MASS EXCISION  02/04/2023   Procedure: EXCISION PSEUDOANEURYSM LEFT ARM;  Surgeon: Sheree Penne Bruckner, MD;  Location: [facility] OR;  Service: Vascular;;   PERIPHERAL VASCULAR BALLOON ANGIOPLASTY  12/13/2022   Procedure: PERIPHERAL VASCULAR BALLOON ANGIOPLASTY;  Surgeon: Sheree Penne Bruckner, MD;  Location: [facility] INVASIVE CV LAB;  Service: Cardiovascular;;   REVISION OF ARTERIOVENOUS GORETEX GRAFT Left 02/04/2023   Procedure: REVISION OF LEFT ARM ARTERIOVENOUS GORETEX GRAFT USING 7mm GORETEX STRETCH GRAFT;  Surgeon: Sheree Penne Bruckner, MD;  Location: [facility] OR;  Service: Vascular;  Laterality: Left;   REVISION OF ARTERIOVENOUS GORETEX GRAFT Left 04/13/2024    Procedure: EXCISION AND LIGATION OF ARTERIOVENOUS GORETEX GRAFT;  Surgeon: Sheree Penne Bruckner, MD;  Location: [facility] OR;  Service: Vascular;  Laterality: Left;   THROMBECTOMY BRACHIAL ARTERY Left 07/26/2018   Procedure: THROMBECTOMY LEFT UPPER EXTREMITY;  Surgeon: Sheree Penne Bruckner, MD;  Location: [facility] OR;  Service: Vascular;  Laterality: Left;   TONSILLECTOMY  1985   VEIN REPAIR Left 07/26/2018   Procedure: STENT LEFT AXILLARY VEIN;  Surgeon: Sheree Penne Bruckner, MD;  Location: [facility] OR;  Service: Vascular;  Laterality: Left;   Family History  Family History  Problem Relation Age of Onset   Prostate cancer Paternal Grandfather    Diabetes Mother    Heart disease Mother    Transient ischemic attack Mother    Diabetes Sister    Diabetes Maternal Grandfather    Colon cancer Neg Hx    Social History  reports that he has been smoking cigarettes. He started smoking about 52 years ago. He has a 13.2 pack-year smoking history. He has been exposed to tobacco smoke. He has never used smokeless tobacco. He reports that he does not currently use alcohol after a past usage of about 2.0 standard drinks of alcohol per week. He reports that he does not use drugs. Allergies  Allergies  Allergen Reactions   Lipitor [Atorvastatin] Other (See Comments)    Myalgia   Home medications Prior to Admission medications   Medication Sig Start Date End Date Taking? Authorizing Provider  acetaminophen  (TYLENOL ) 500 MG tablet Take 1,000 mg by mouth every 6 (six) hours as needed for mild pain (pain score 1-3).   Yes [provider]  amLODipine  (NORVASC ) 2.5 MG tablet Take 1 tablet (2.5 mg total) by mouth daily. 05/23/23 05/22/24 Yes Norleen Lynwood ORN, MD  aspirin  81 MG EC tablet Take 81 mg by mouth daily. 12/29/17  Yes [provider]  cinacalcet  (SENSIPAR ) 60 MG tablet Take 60 mg by mouth 2 (two) times daily. 09/13/23  Yes [provider]  diphenhydrAMINE (BENADRYL) 25 mg capsule  Take 75 mg by mouth daily as needed for itching. 07/31/19  Yes [provider]  Doxercalciferol  (HECTOROL  IV) Inject 10 mcg into the vein 3 (three) times a week. During dialysis (Tuesdays, Thursdays, and Saturdays) 10/15/23 10/13/24 Yes [provider]  Dulaglutide  (TRULICITY ) 1.5 MG/0.5ML SOAJ Inject 1.5 mg into the skin once a week. Patient taking differently: Inject 1.5 mg into the skin once a week. Sundays 03/09/24  Yes Norleen Lynwood ORN, MD  losartan (COZAAR) 50 MG tablet Take 50 mg by mouth daily.   Yes [provider]  Methoxy PEG-Epoetin  Beta (MIRCERA IJ) Inject 50 mcg as directed See admin instructions. Every 4 weeks during dialysis 11/15/23 11/13/24 Yes [provider]  metoprolol  succinate (TOPROL -XL) 50 MG 24 hr tablet TAKE 1 TABLET BY MOUTH EVERY DAY  WITH OR IMMEDIATELY FOLLOWING A MEAL 04/23/24  Yes Norleen Lynwood ORN, MD  rosuvastatin  (CRESTOR ) 20 MG tablet TAKE 1 TABLET BY MOUTH EVERY DAY 03/24/23  Yes Norleen Lynwood ORN, MD  VELPHORO 500 MG chewable tablet Chew 500 mg by mouth 3 (three) times daily with meals. 09/20/22  Yes [provider]  glucose blood (ONE TOUCH ULTRA TEST) test strip Use as instructed once daily E11.9 10/26/17   Norleen Lynwood ORN, MD  Lancets [facility] ULTRASOFT) lancets 1 each by Other route 2 (two) times daily. Use to check blood sugars once a day Dx e11.9 10/26/17   Norleen Lynwood ORN, MD     Vitals:   05/23/24 1634 05/25/24 1053  BP:  (!) 148/66  Pulse:  98  Resp:  18  Temp:  98.9 F (37.2 C)  TempSrc:  Oral  SpO2:  99%  Weight: 73.9 kg 78.5 kg  Height: 6' (1.829 m) 6' (1.829 m)   Exam Gen alert, no distress Sclera anicteric, throat clear  No jvd or bruits Chest clear bilat to bases RRR no MRG Abd soft ntnd no mass or ascites +bs Ext no LE or UE edema, no other edema Neuro is alert, Ox 3 , nf    RIJ TDC   OP HD: TTS HD    Assessment/ Plan: Hyperkalemia: K+ 6.9- 7.1, checked more than once. HD access surgery was postponed.  Plan if for HD this evening using low K+ bath. He should still go to his HD tomorrow. He will be transferred back to short stay after HD completed.  ESRD: on HD TTS. Had HD yesterday. Extra HD today for ^^K+.  Volume: no gross volume overload. 2 L UF goal.  Anemia of esrd: Hb 10-12 here, stable.        Myer Fret  MD [facility] 05/25/2024, 5:28 PM  Recent Labs  Lab 05/25/24 1116 05/25/24 1134 05/25/24 1136  HGB 10.9* 11.2*  --   CALCIUM   --   --  10.0  CREATININE 9.00* 9.00* 9.14*  K 7.0* 6.9* 6.8*   Inpatient medications:  chlorhexidine   60 mL Topical Once   And   [START ON 05/26/2024] chlorhexidine   60 mL Topical Once   chlorhexidine   15 mL Mouth/Throat Once   Or   mouth rinse  15 mL Mouth Rinse Once    sodium chloride  10 mL/hr at 05/25/24 1143    ceFAZolin  (ANCEF ) IV     insulin  aspart

## 2024-05-25 NOTE — Progress Notes (Addendum)
 Aware that Christopher Lopez had K 7 pre-op  and therefore AVF surgery cancelled.  I have placed ED orders for dialysis today and 1X dose Lokelma.  HD unit aware and will bring him up for dialysis as soon as possible - will likely be 4:30-5ish pending any delays in HD unit.  After dialysis, he should be good to discharge home.  Izetta Boehringer, PA-C [employer] Pager [phone]

## 2024-06-01 ENCOUNTER — Ambulatory Visit: Payer: Self-pay | Admitting: Internal Medicine

## 2024-06-01 ENCOUNTER — Ambulatory Visit: Admitting: Internal Medicine

## 2024-06-01 ENCOUNTER — Other Ambulatory Visit (HOSPITAL_COMMUNITY): Payer: Self-pay

## 2024-06-01 ENCOUNTER — Ambulatory Visit

## 2024-06-01 ENCOUNTER — Telehealth: Payer: Self-pay

## 2024-06-01 ENCOUNTER — Encounter: Payer: Self-pay | Admitting: Internal Medicine

## 2024-06-01 VITALS — BP 140/82 | HR 105 | Temp 98.5°F | Ht 72.0 in | Wt 159.0 lb

## 2024-06-01 DIAGNOSIS — M545 Low back pain, unspecified: Secondary | ICD-10-CM

## 2024-06-01 DIAGNOSIS — M549 Dorsalgia, unspecified: Secondary | ICD-10-CM | POA: Diagnosis not present

## 2024-06-01 DIAGNOSIS — E1129 Type 2 diabetes mellitus with other diabetic kidney complication: Secondary | ICD-10-CM | POA: Diagnosis not present

## 2024-06-01 DIAGNOSIS — I10 Essential (primary) hypertension: Secondary | ICD-10-CM | POA: Diagnosis not present

## 2024-06-01 DIAGNOSIS — Z7985 Long-term (current) use of injectable non-insulin antidiabetic drugs: Secondary | ICD-10-CM

## 2024-06-01 DIAGNOSIS — E782 Mixed hyperlipidemia: Secondary | ICD-10-CM | POA: Diagnosis not present

## 2024-06-01 MED ORDER — TRAMADOL HCL 50 MG PO TABS: 50.0000 mg | ORAL_TABLET | Freq: Four times a day (QID) | ORAL | 0 refills | Status: DC | PRN

## 2024-06-01 MED ORDER — CYCLOBENZAPRINE HCL 5 MG PO TABS
5.0000 mg | ORAL_TABLET | Freq: Three times a day (TID) | ORAL | 1 refills | Status: AC | PRN
Start: 2024-06-01 — End: ?

## 2024-06-01 NOTE — Assessment & Plan Note (Signed)
 BP Readings from Last 3 Encounters:  06/01/24 (!) 140/82  05/25/24 129/78  05/16/24 (!) 169/90   Uncontrolled mild, pt to continue medical treatment losartan  50 mg every day, amlodipine  2.,5 mg every day, declines other change for now

## 2024-06-01 NOTE — Telephone Encounter (Signed)
 Pharmacy Patient Advocate Encounter  Received notification from [employer] that Prior Authorization for Tramadol  50 has been APPROVED from 06/01/24 to 07/01/24. Ran test claim, Copay is $1.28. This test claim was processed through [facility]- copay amounts may vary at other pharmacies due to pharmacy/plan contracts, or as the patient moves through the different stages of their insurance plan.   PA #/Case ID/Reference #: # [id number]

## 2024-06-01 NOTE — Assessment & Plan Note (Signed)
 Moderate persistent, for tramadol  prn, for c spine xray, also can't r/o right radicultiis- consider MRI for any worsening

## 2024-06-01 NOTE — Assessment & Plan Note (Addendum)
 C/wmsk spasm - for muscle relaxer prn, also ls spine xray,  to f/u any worsening symptoms or concerns

## 2024-06-01 NOTE — Progress Notes (Signed)
 Patient ID: Christopher Lopez, male   DOB: [birth year], 63 y.o.   MRN: [id number]        Chief Complaint: follow up neck pain, low back pain, dm, hld, htn       HPI:  Christopher Lopez is a 63 y.o. male here with family, c/o right post lat neck pain with radiation to the right upper arm, mild, intermittent and not assoc with UE weakness or loss of grip.  Also has lower back pain with right sore area, but no radicular pain.  Pt denies chest pain, increased sob or doe, wheezing, orthopnea, PND, increased LE swelling, palpitations, dizziness or syncope.   Pt denies polydipsia, polyuria, or new focal neuro s/s.          Wt Readings from Last 3 Encounters:  06/01/24 159 lb (72.1 kg)  05/25/24 173 lb 1 oz (78.5 kg)  05/16/24 162 lb 14.4 oz (73.9 kg)   BP Readings from Last 3 Encounters:  06/01/24 (!) 140/82  05/25/24 129/78  05/16/24 (!) 169/90         Past Medical History:  Diagnosis Date   Alcohol abuse 09/18/2011   Allergic rhinitis, cause unspecified 09/20/2011   Anemia, unspecified 09/18/2011   Arthritis    Childhood asthma 09/20/2011   CKD (chronic kidney disease) stage 5, GFR less than 15 ml/min (HCC)    Dialysis - T/Th/Sa   Colitis 09/18/2011   Dementia (HCC) 09/18/2011   Diabetes mellitus    Diverticulosis of colon without hemorrhage 02/27/2014   Foot ulcer (HCC) 09/18/2011   High cholesterol    HTN (hypertension) 09/18/2011   Hyperlipidemia 11/08/2011   Hypertension    Impaired glucose tolerance 09/18/2011   Pneumonia 09/18/2011   Pre-ulcerative corn or callous 11/20/2011   Stroke (HCC)    left leg deficit   Type II or unspecified type diabetes mellitus without mention of complication, uncontrolled 09/20/2011   Wears glasses    Past Surgical History:  Procedure Laterality Date   A/V FISTULAGRAM Left 12/13/2022   Procedure: A/V Fistulagram;  Surgeon: Sheree Penne Bruckner, MD;  Location: [facility] INVASIVE CV LAB;  Service: Cardiovascular;  Laterality: Left;   AV FISTULA  PLACEMENT Left 12/23/2017   Procedure: CREATION OF BASILIAC - CEPHALIC  ARTERIOVENOUS (AV) FISTULA  STAGE ONE;  Surgeon: Sheree Penne Bruckner, MD;  Location: [facility] OR;  Service: Vascular;  Laterality: Left;   AV FISTULA PLACEMENT Left 07/26/2018   Procedure: INSERTION OF ARTERIOVENOUS (AV) GORE-TEX GRAFT ARM;  Surgeon: Sheree Penne Bruckner, MD;  Location: [facility] OR;  Service: Vascular;  Laterality: Left;   BASCILIC VEIN TRANSPOSITION Left 03/01/2018   Procedure: BASILIC VEIN TRANSPOSITION SECOND STAGE;  Surgeon: Sheree Penne Bruckner, MD;  Location: [facility] OR;  Service: Vascular;  Laterality: Left;   INSERTION OF DIALYSIS CATHETER Right 12/23/2017   Procedure: INSERTION OF PALINDROME  DIALYSIS CATHETER;  Surgeon: Sheree Penne Bruckner, MD;  Location: [facility] OR;  Service: Vascular;  Laterality: Right;   INSERTION OF DIALYSIS CATHETER Left 02/04/2023   Procedure: INSERTION OF TUNNELED DIALYSIS CATHETER;  Surgeon: Sheree Penne Bruckner, MD;  Location: [facility] OR;  Service: Vascular;  Laterality: Left;   INSERTION OF DIALYSIS CATHETER Left 04/13/2024   Procedure: INSERTION OF DIALYSIS CATHETER USING PALINDROME 23CM CATHETER;  Surgeon: Sheree Penne Bruckner, MD;  Location: [facility] OR;  Service: Vascular;  Laterality: Left;   MASS EXCISION  02/04/2023   Procedure: EXCISION PSEUDOANEURYSM LEFT ARM;  Surgeon: Sheree Penne Bruckner, MD;  Location: [facility] OR;  Service: Vascular;;  PERIPHERAL VASCULAR BALLOON ANGIOPLASTY  12/13/2022   Procedure: PERIPHERAL VASCULAR BALLOON ANGIOPLASTY;  Surgeon: Sheree Penne Bruckner, MD;  Location: [facility] INVASIVE CV LAB;  Service: Cardiovascular;;   REVISION OF ARTERIOVENOUS GORETEX GRAFT Left 02/04/2023   Procedure: REVISION OF LEFT ARM ARTERIOVENOUS GORETEX GRAFT USING 7mm GORETEX STRETCH GRAFT;  Surgeon: Sheree Penne Bruckner, MD;  Location: [facility] OR;  Service: Vascular;  Laterality: Left;   REVISION OF ARTERIOVENOUS GORETEX GRAFT Left 04/13/2024   Procedure: EXCISION AND  LIGATION OF ARTERIOVENOUS GORETEX GRAFT;  Surgeon: Sheree Penne Bruckner, MD;  Location: [facility] OR;  Service: Vascular;  Laterality: Left;   THROMBECTOMY BRACHIAL ARTERY Left 07/26/2018   Procedure: THROMBECTOMY LEFT UPPER EXTREMITY;  Surgeon: Sheree Penne Bruckner, MD;  Location: [facility] OR;  Service: Vascular;  Laterality: Left;   TONSILLECTOMY  1985   VEIN REPAIR Left 07/26/2018   Procedure: STENT LEFT AXILLARY VEIN;  Surgeon: Sheree Penne Bruckner, MD;  Location: [facility] OR;  Service: Vascular;  Laterality: Left;    reports that he has been smoking cigarettes. He started smoking about 52 years ago. He has a 13.2 pack-year smoking history. He has been exposed to tobacco smoke. He has never used smokeless tobacco. He reports that he does not currently use alcohol after a past usage of about 2.0 standard drinks of alcohol per week. He reports that he does not use drugs. family history includes Diabetes in his maternal grandfather, mother, and sister; Heart disease in his mother; Prostate cancer in his paternal grandfather; Transient ischemic attack in his mother. Allergies  Allergen Reactions   Lipitor [Atorvastatin] Other (See Comments)    Myalgia   Current Outpatient Medications on File Prior to Visit  Medication Sig Dispense Refill   acetaminophen  (TYLENOL ) 500 MG tablet Take 1,000 mg by mouth every 6 (six) hours as needed for mild pain (pain score 1-3).     aspirin  81 MG EC tablet Take 81 mg by mouth daily.     cinacalcet  (SENSIPAR ) 60 MG tablet Take 60 mg by mouth 2 (two) times daily.     diphenhydrAMINE  (BENADRYL ) 25 mg capsule Take 75 mg by mouth daily as needed for itching.     Doxercalciferol  (HECTOROL  IV) Inject 10 mcg into the vein 3 (three) times a week. During dialysis (Tuesdays, Thursdays, and Saturdays)     Dulaglutide  (TRULICITY ) 1.5 MG/0.5ML SOAJ Inject 1.5 mg into the skin once a week. (Patient taking differently: Inject 1.5 mg into the skin once a week. Sundays) 6 mL 3   glucose  blood (ONE TOUCH ULTRA TEST) test strip Use as instructed once daily E11.9 100 each 12   Lancets (ONETOUCH ULTRASOFT) lancets 1 each by Other route 2 (two) times daily. Use to check blood sugars once a day Dx e11.9 100 each 3   LOKELMA  5 g packet      losartan  (COZAAR ) 50 MG tablet Take 50 mg by mouth daily.     Methoxy PEG-Epoetin  Beta (MIRCERA IJ) Inject 50 mcg as directed See admin instructions. Every 4 weeks during dialysis     metoprolol  succinate (TOPROL -XL) 50 MG 24 hr tablet TAKE 1 TABLET BY MOUTH EVERY DAY  WITH OR IMMEDIATELY FOLLOWING A MEAL 90 tablet 3   rosuvastatin  (CRESTOR ) 20 MG tablet TAKE 1 TABLET BY MOUTH EVERY DAY 90 tablet 3   VELPHORO 500 MG chewable tablet Chew 500 mg by mouth 3 (three) times daily with meals.     amLODipine  (NORVASC ) 2.5 MG tablet Take 1 tablet (2.5 mg total) by mouth  daily. 90 tablet 3   No current facility-administered medications on file prior to visit.        ROS:  All others reviewed and negative.  Objective        PE:  BP (!) 140/82 (BP Location: Right Arm, Patient Position: Sitting, Cuff Size: Normal)   Pulse (!) 105   Temp 98.5 F (36.9 C) (Oral)   Ht 6' (1.829 m)   Wt 159 lb (72.1 kg)   SpO2 100%   BMI 21.56 kg/m                 Constitutional: Pt appears in NAD               HENT: Head: NCAT.                Right Ear: External ear normal.                 Left Ear: External ear normal.                Eyes: . Pupils are equal, round, and reactive to light. Conjunctivae and EOM are normal               Nose: without d/c or deformity               Neck: Neck supple. Gross normal ROM and tender spasm right upper trapezoid, and also has right lumbar paraspinal tender spasm               Cardiovascular: Normal rate and regular rhythm.                 Pulmonary/Chest: Effort normal and breath sounds without rales or wheezing.                Abd:  Soft, NT, ND, + BS, no organomegaly               Neurological: Pt is alert. At baseline  orientation, motor grossly intact               Skin: Skin is warm. No rashes, no other new lesions, LE edema - none               Psychiatric: Pt behavior is normal without agitation   Micro: none  Cardiac tracings I have personally interpreted today:  none  Pertinent Radiological findings (summarize): none   Lab Results  Component Value Date   WBC 9.9 11/28/2023   HGB 11.2 (L) 05/25/2024   HCT 33.0 (L) 05/25/2024   PLT 239.0 11/28/2023   GLUCOSE 98 05/25/2024   CHOL 171 11/28/2023   TRIG 84.0 11/28/2023   HDL 50.20 11/28/2023   LDLDIRECT 131.0 08/11/2017   LDLCALC 104 (H) 11/28/2023   ALT 7 11/28/2023   AST 12 11/28/2023   NA 136 05/25/2024   K 6.8 (HH) 05/25/2024   CL 95 (L) 05/25/2024   CREATININE 9.14 (H) 05/25/2024   BUN 22 05/25/2024   CO2 29 05/25/2024   TSH 1.80 11/28/2023   PSA 0.73 10/12/2021   HGBA1C 5.5 11/28/2023   Assessment/Plan:  Christopher Lopez is a 63 y.o. Black or African American [2] male with  has a past medical history of Alcohol abuse (09/18/2011), Allergic rhinitis, cause unspecified (09/20/2011), Anemia, unspecified (09/18/2011), Arthritis, Childhood asthma (09/20/2011), CKD (chronic kidney disease) stage 5, GFR less than 15 ml/min (HCC), Colitis (09/18/2011), Dementia (HCC) (09/18/2011), Diabetes mellitus, Diverticulosis of colon without hemorrhage (02/27/2014), Foot ulcer (  HCC) (09/18/2011), High cholesterol, HTN (hypertension) (09/18/2011), Hyperlipidemia (11/08/2011), Hypertension, Impaired glucose tolerance (09/18/2011), Pneumonia (09/18/2011), Pre-ulcerative corn or callous (11/20/2011), Stroke (HCC), Type II or unspecified type diabetes mellitus without mention of complication, uncontrolled (09/20/2011), and Wears glasses.  Type 2 diabetes mellitus with other diabetic kidney complication (HCC) With dm kidney failure ESRD with HD - tue, thur, sat;  Lab Results  Component Value Date   CREATININE 9.14 (H) 05/25/2024   Stable overall, cont to  avoid nephrotoxins Lab Results  Component Value Date   HGBA1C 5.5 11/28/2023   Stable, pt to continue current medical treatment trulicity  1.5 weekly   Hyperlipidemia, unspecified Lab Results  Component Value Date   LDLCALC 104 (H) 11/28/2023   Uncontrolled, pt to continue current statin crestor  20 mg every day and lower chol diet, declines further change for now   Essential (primary) hypertension BP Readings from Last 3 Encounters:  06/01/24 (!) 140/82  05/25/24 129/78  05/16/24 (!) 169/90   Uncontrolled mild, pt to continue medical treatment losartan  50 mg every day, amlodipine  2.,5 mg every day, declines other change for now   Acute left-sided low back pain without sciatica C/wmsk spasm - for muscle relaxer prn, also ls spine xray,  to f/u any worsening symptoms or concerns  Upper back pain on right side Moderate persistent, for tramadol  prn, for c spine xray, also can't r/o right radicultiis- consider MRI for any worsening  Followup: Return in about 6 months (around 11/29/2024).  Lynwood Rush, MD 06/01/2024 8:54 PM [facility] [place] [facility] Internal Medicine

## 2024-06-01 NOTE — Patient Instructions (Addendum)
 Please take all new medication as prescribed - the tramadol  as needed for pain, and muscle relaxer as needed for the right upper back, and left lower back spasms  Remember it is ok to use the pain medicine at night only if that is the worst time of pain  Please continue all other medications as before, and refills have been done if requested.  Please have the pharmacy call with any other refills you may need.  Please continue your efforts at being more active, low cholesterol diet, and weight control.  You are otherwise up to date with prevention measures today.  Please keep your appointments with your specialists as you may have planned - HD on T Thur Sat  Please go to the XRAY Department in the first floor for the x-ray testing  You will be contacted by phone if any changes need to be made immediately.  Otherwise, you will receive a letter about your results with an explanation, but please check with MyChart first.  Please make an Appointment to return in 6 months, or sooner if needed

## 2024-06-01 NOTE — Assessment & Plan Note (Signed)
 Lab Results  Component Value Date   LDLCALC 104 (H) 11/28/2023   Uncontrolled, pt to continue current statin crestor  20 mg every day and lower chol diet, declines further change for now

## 2024-06-01 NOTE — Telephone Encounter (Signed)
 Pharmacy Patient Advocate Encounter   Received notification from Onbase that prior authorization for Tramadol  50 is required/requested.   Insurance verification completed.   The patient is insured through [facility].   Per test claim: PA required; PA submitted to above mentioned insurance via Latent Key/confirmation #/EOC [facility] Status is pending

## 2024-06-01 NOTE — Assessment & Plan Note (Signed)
 With dm kidney failure ESRD with HD - tue, thur, sat;  Lab Results  Component Value Date   CREATININE 9.14 (H) 05/25/2024   Stable overall, cont to avoid nephrotoxins Lab Results  Component Value Date   HGBA1C 5.5 11/28/2023   Stable, pt to continue current medical treatment trulicity  1.5 weekly

## 2024-06-05 ENCOUNTER — Other Ambulatory Visit: Payer: Self-pay

## 2024-06-05 DIAGNOSIS — N186 End stage renal disease: Secondary | ICD-10-CM

## 2024-06-14 ENCOUNTER — Other Ambulatory Visit: Payer: Self-pay | Admitting: Internal Medicine

## 2024-06-14 ENCOUNTER — Other Ambulatory Visit: Payer: Self-pay

## 2024-06-20 ENCOUNTER — Ambulatory Visit: Admitting: Podiatry

## 2024-06-20 ENCOUNTER — Encounter: Payer: Self-pay | Admitting: Podiatry

## 2024-06-20 DIAGNOSIS — Q828 Other specified congenital malformations of skin: Secondary | ICD-10-CM

## 2024-06-20 DIAGNOSIS — B351 Tinea unguium: Secondary | ICD-10-CM

## 2024-06-20 DIAGNOSIS — E1142 Type 2 diabetes mellitus with diabetic polyneuropathy: Secondary | ICD-10-CM | POA: Diagnosis not present

## 2024-06-20 DIAGNOSIS — N186 End stage renal disease: Secondary | ICD-10-CM

## 2024-06-20 DIAGNOSIS — M79676 Pain in unspecified toe(s): Secondary | ICD-10-CM

## 2024-06-20 NOTE — Progress Notes (Signed)
This patient returns to my office for at risk foot care.  This patient requires this care by a professional since this patient will be at risk due to having  ESRD, CKD and diabetes.  This patient is unable to cut nails himself since the patient cannot reach his nails.These nails are painful walking and wearing shoes.  This patient presents for at risk foot care today.  General Appearance  Alert, conversant and in no acute stress.  Vascular  Dorsalis pedis and posterior tibial  pulses are palpable  bilaterally.  Capillary return is within normal limits  bilaterally. Temperature is within normal limits  bilaterally.  Neurologic  Senn-Weinstein monofilament wire test diminished   bilaterally. Muscle power within normal limits bilaterally.  Nails Thick disfigured discolored nails with subungual debris  from hallux to fifth toes bilaterally. No evidence of bacterial infection or drainage bilaterally.  Orthopedic  No limitations of motion  feet .  No crepitus or effusions noted.  No bony pathology or digital deformities noted.  Skin  normotropic skin  noted bilaterally.  No signs of infections or ulcers noted.   Porokeratosis sub 4  B/L.   symptomatic.  Onychomycosis  Pain in right toes  Pain in left toes  Porokeratosis sub 4 left foot.  Consent was obtained for treatment procedures.   Mechanical debridement of nails 1-5  bilaterally performed with a nail nipper.  Filed with dremel without incident.  Debride porokeratosis with # 15 blade and dremel tool.   Return office visit   9 weeks                  Told patient to return for periodic foot care and evaluation due to potential at risk complications.   Gardiner Barefoot DPM

## 2024-06-26 ENCOUNTER — Encounter: Payer: Self-pay | Admitting: *Deleted

## 2024-06-26 NOTE — Progress Notes (Signed)
 KELLIS MCADAM                                          MRN: [id number]   06/26/2024   The [facility] Quality Team Specialist reviewed this patient medical record for the purposes of chart review for care gap closure. The following were reviewed: abstraction for care gap closure-glycemic status assessment.    [facility] Quality Team

## 2024-07-11 NOTE — Progress Notes (Signed)
 [name]                                          MRN: [id number]   07/11/2024   The [facility] Quality Team Specialist reviewed this patient medical record for the purposes of chart review for care gap closure. The following were reviewed: chart review for care gap closure-glycemic status assessment.    [facility] Quality Team

## 2024-07-25 ENCOUNTER — Other Ambulatory Visit: Payer: Self-pay

## 2024-07-25 ENCOUNTER — Telehealth: Payer: Self-pay

## 2024-07-25 ENCOUNTER — Encounter (HOSPITAL_COMMUNITY): Payer: Self-pay | Admitting: Vascular Surgery

## 2024-07-25 NOTE — Telephone Encounter (Signed)
 Copied from CRM [id number]. Topic: Medical Record Request - Other >> Jul 25, 2024  9:02 AM Suzen RAMAN wrote: Reason for CRM: Patient called to check on the status of paperwork being received by the office. Per patient he mailed paperwork from the [facility] authority that needed to be completed by the provider back on 07/16/24.    Please contact patient with an update on this when available [phone]

## 2024-07-25 NOTE — Progress Notes (Signed)
 PCP - Dr Lynwood Rush Cardiologist - none  Chest x-ray - 04/13/24 EKG - 05/25/24 Stress Test - n/a ECHO - 07/17/18 Cardiac Cath - n/a  ICD Pacemaker/Loop - n/a  Sleep Study -  n/a  Diabetes Type 2 Does not check blood sugar.   Trulicity  instructions: Hold for 7 days prior to procedure. Last dose was on 07/15/24.     Blood Thinner Instructions:  n/a  Aspirin  Instructions: Continue per MD  NPO  Anesthesia review: Yes  STOP now taking any Aspirin  (unless otherwise instructed by your surgeon), Aleve, Naproxen, Ibuprofen, Motrin, Advil, Goody's, BC's, all herbal medications, fish oil, and all vitamins.   Coronavirus Screening Do you have any of the following symptoms:  Cough yes/no: No Fever (>100.44F)  yes/no: No Runny nose yes/no: No Sore throat yes/no: No Difficulty breathing/shortness of breath  yes/no: No  Have you traveled in the last 14 days and where? yes/no: No  Patient's Brother Gerre verbalized understanding of instructions that were given via phone.

## 2024-07-26 NOTE — Progress Notes (Addendum)
 Anesthesia Chart Review: Same day workup  64 year old male with pertinent history including current smoker, HTN, NIDDM2, ESRD via left IJ TDC on TTS (recently underwent ligation and excision of LUE AV graft 04/13/2024), anemia, alcohol abuse (sober since ~ 2017), hypercholesterolemia, CVA (LLE weakness), dementia.   Echo on 07/17/18 showed mild LVH, mildly reduced LV systolic function, EF 45 to 50%, mild diffuse hypokinesis with no identifiable regional variations, grade 1 diastolic dysfunction, trivial MR.  He was subsequently referred to cardiologist Dr. Court and was evaluated on 08/23/18. He felt patient's mild LV dysfunction was multifactorial from long history of controlled hypertension as well as ethanol abuse, now sober. He had been on hemodialysis for about six months at that time. He did not order any additional testing, and as needed cardiology follow-up recommended.   Patient previously presented for AV fistula creation on 05/25/2024 but was found to be hyperkalemic at that time with potassium 6.9.  Surgery was canceled and he was admitted for dialysis.  Reports last dose Trulicity  07/15/2024.  He will need day of surgery labs and evaluation.  Echo 07/17/18: Study Conclusions  - Procedure narrative: Transthoracic echocardiography. Image    quality was adequate. Intravenous contrast (Definity ) was    administered to opacify the LV.  - Left ventricle: The cavity size was normal. Wall thickness was    increased in a pattern of mild LVH. Systolic function was mildly    reduced. The estimated ejection fraction was in the range of 45%    to 50%. Mild diffuse hypokinesis with no identifiable regional    variations. There was an increased relative contribution of    atrial contraction to ventricular filling. Doppler parameters are    consistent with abnormal left ventricular relaxation (grade 1    diastolic dysfunction).  - Mitral valve: There was trivial regurgitation.      Lynwood Geofm RIGGERS [facility] Short Stay Center/Anesthesiology Phone [phone] 07/26/2024 10:33 AM

## 2024-07-26 NOTE — Anesthesia Preprocedure Evaluation (Addendum)
 "                                  Anesthesia Evaluation  Patient identified by MRN, date of birth, ID band Patient awake    Reviewed: Allergy & Precautions, H&P , NPO status , Patient's Chart, lab work & pertinent test results  Airway Mallampati: II  TM Distance: >3 FB Neck ROM: Full    Dental  (+) Dental Advisory Given   Pulmonary asthma , Current Smoker and Patient abstained from smoking.   Pulmonary exam normal breath sounds clear to auscultation       Cardiovascular hypertension, Pt. on medications negative cardio ROS Normal cardiovascular exam Rhythm:Regular Rate:Normal     Neuro/Psych  Headaches      Dementia CVA, Residual Symptoms  negative psych ROS   GI/Hepatic negative GI ROS, Neg liver ROS,,,  Endo/Other  negative endocrine ROSdiabetes, Type 2    Renal/GU ESRF and DialysisRenal disease  negative genitourinary   Musculoskeletal  (+) Arthritis ,    Abdominal   Peds negative pediatric ROS (+)  Hematology negative hematology ROS (+)   Anesthesia Other Findings   Reproductive/Obstetrics negative OB ROS                              Anesthesia Physical Anesthesia Plan  ASA: 4  Anesthesia Plan: General   Post-op Pain Management:    Induction: Intravenous  PONV Risk Score and Plan: 1 and Ondansetron  and Treatment may vary due to age or medical condition  Airway Management Planned: LMA  Additional Equipment:   Intra-op Plan:   Post-operative Plan: Extubation in OR  Informed Consent: I have reviewed the patients History and Physical, chart, labs and discussed the procedure including the risks, benefits and alternatives for the proposed anesthesia with the patient or authorized representative who has indicated his/her understanding and acceptance.     Dental advisory given  Plan Discussed with: CRNA  Anesthesia Plan Comments: (PAT note by Lynwood Hope, PA-C: 64 year old male with pertinent history  including current smoker, HTN, NIDDM2, ESRD via left IJ TDC on TTS (recently underwent ligation and excision of LUE AV graft 04/13/2024), anemia, alcohol abuse (sober since ~ 2017), hypercholesterolemia, CVA (LLE weakness), dementia.   Echo on 07/17/18 showed mild LVH, mildly reduced LV systolic function, EF 45 to 50%, mild diffuse hypokinesis with no identifiable regional variations, grade 1 diastolic dysfunction, trivial MR.  He was subsequently referred to cardiologist Dr. Court and was evaluated on 08/23/18. He felt patient's mild LV dysfunction was multifactorial from long history of controlled hypertension as well as ethanol abuse, now sober. He had been on hemodialysis for about six months at that time. He did not order any additional testing, and as needed cardiology follow-up recommended.   Patient previously presented for AV fistula creation on 05/25/2024 but was found to be hyperkalemic at that time with potassium 6.9.  Surgery was canceled and he was admitted for dialysis.  Reports last dose Trulicity  07/15/2024.  He will need day of surgery labs and evaluation.  Echo 07/17/18: Study Conclusions  - Procedure narrative: Transthoracic echocardiography. Image  quality was adequate. Intravenous contrast (Definity ) was  administered to opacify the LV.  - Left ventricle: The cavity size was normal. Wall thickness was  increased in a pattern of mild LVH. Systolic function was mildly  reduced. The estimated ejection fraction was  in the range of 45%  to 50%. Mild diffuse hypokinesis with no identifiable regional  variations. There was an increased relative contribution of  atrial contraction to ventricular filling. Doppler parameters are  consistent with abnormal left ventricular relaxation (grade 1  diastolic dysfunction).  - Mitral valve: There was trivial regurgitation.    )         Anesthesia Quick Evaluation  "

## 2024-07-26 NOTE — Progress Notes (Signed)
 Patient was called to be informed that the surgery time for tomorrow was changed to 11:54. Patient was instructed to be at the hospital at 09:25. Patient verbalized understanding.

## 2024-07-26 NOTE — Telephone Encounter (Signed)
Will be on the look out for the paperwork

## 2024-07-27 ENCOUNTER — Ambulatory Visit (HOSPITAL_BASED_OUTPATIENT_CLINIC_OR_DEPARTMENT_OTHER): Admitting: Physician Assistant

## 2024-07-27 ENCOUNTER — Encounter (HOSPITAL_COMMUNITY)
Admission: RE | Disposition: A | Discharge status: Home / Self Care | Payer: Self-pay | Source: Home / Self Care | Attending: Vascular Surgery

## 2024-07-27 ENCOUNTER — Encounter (HOSPITAL_COMMUNITY): Payer: Self-pay | Admitting: Vascular Surgery

## 2024-07-27 ENCOUNTER — Other Ambulatory Visit: Payer: Self-pay

## 2024-07-27 ENCOUNTER — Ambulatory Visit (HOSPITAL_COMMUNITY)
Admission: RE | Admit: 2024-07-27 | Discharge: 2024-07-27 | Disposition: A | Discharge status: Home / Self Care | Attending: Vascular Surgery | Admitting: Vascular Surgery

## 2024-07-27 ENCOUNTER — Encounter (HOSPITAL_COMMUNITY): Admitting: Physician Assistant

## 2024-07-27 DIAGNOSIS — N186 End stage renal disease: Secondary | ICD-10-CM

## 2024-07-27 DIAGNOSIS — Z992 Dependence on renal dialysis: Secondary | ICD-10-CM | POA: Diagnosis not present

## 2024-07-27 DIAGNOSIS — Z7985 Long-term (current) use of injectable non-insulin antidiabetic drugs: Secondary | ICD-10-CM | POA: Insufficient documentation

## 2024-07-27 DIAGNOSIS — E7800 Pure hypercholesterolemia, unspecified: Secondary | ICD-10-CM | POA: Insufficient documentation

## 2024-07-27 DIAGNOSIS — I120 Hypertensive chronic kidney disease with stage 5 chronic kidney disease or end stage renal disease: Secondary | ICD-10-CM | POA: Insufficient documentation

## 2024-07-27 DIAGNOSIS — E1122 Type 2 diabetes mellitus with diabetic chronic kidney disease: Secondary | ICD-10-CM

## 2024-07-27 DIAGNOSIS — F17210 Nicotine dependence, cigarettes, uncomplicated: Secondary | ICD-10-CM | POA: Diagnosis not present

## 2024-07-27 DIAGNOSIS — I69954 Hemiplegia and hemiparesis following unspecified cerebrovascular disease affecting left non-dominant side: Secondary | ICD-10-CM | POA: Diagnosis not present

## 2024-07-27 HISTORY — PX: AV FISTULA PLACEMENT: SHX1204

## 2024-07-27 HISTORY — PX: INSERTION OF ARTERIOVENOUS (AV) ARTEGRAFT ARM: SHX6779

## 2024-07-27 LAB — POCT I-STAT, CHEM 8
BUN: 22 mg/dL (ref 8–23)
BUN: 30 mg/dL — ABNORMAL HIGH (ref 8–23)
Calcium, Ion: 0.96 mmol/L — ABNORMAL LOW (ref 1.15–1.40)
Calcium, Ion: 1.09 mmol/L — ABNORMAL LOW (ref 1.15–1.40)
Chloride: 95 mmol/L — ABNORMAL LOW (ref 98–111)
Chloride: 98 mmol/L (ref 98–111)
Creatinine, Ser: 9.2 mg/dL — ABNORMAL HIGH (ref 0.61–1.24)
Creatinine, Ser: 9.2 mg/dL — ABNORMAL HIGH (ref 0.61–1.24)
Glucose, Bld: 78 mg/dL (ref 70–99)
Glucose, Bld: 78 mg/dL (ref 70–99)
HCT: 35 % — ABNORMAL LOW (ref 39.0–52.0)
HCT: 37 % — ABNORMAL LOW (ref 39.0–52.0)
Hemoglobin: 11.9 g/dL — ABNORMAL LOW (ref 13.0–17.0)
Hemoglobin: 12.6 g/dL — ABNORMAL LOW (ref 13.0–17.0)
Potassium: 6.4 mmol/L (ref 3.5–5.1)
Potassium: 8.1 mmol/L (ref 3.5–5.1)
Sodium: 130 mmol/L — ABNORMAL LOW (ref 135–145)
Sodium: 134 mmol/L — ABNORMAL LOW (ref 135–145)
TCO2: 27 mmol/L (ref 22–32)
TCO2: 27 mmol/L (ref 22–32)

## 2024-07-27 LAB — GLUCOSE, CAPILLARY
Glucose-Capillary: 86 mg/dL (ref 70–99)
Glucose-Capillary: 109 mg/dL — ABNORMAL HIGH (ref 70–99)

## 2024-07-27 MED ORDER — ONDANSETRON HCL 4 MG/2ML IJ SOLN
INTRAMUSCULAR | Status: DC | PRN
  Administered 2024-07-27: 4 mg via INTRAVENOUS

## 2024-07-27 MED ORDER — SODIUM CHLORIDE 0.9 % IV SOLN: INTRAVENOUS | Status: DC

## 2024-07-27 MED ORDER — LIDOCAINE 2% (20 MG/ML) 5 ML SYRINGE
INTRAMUSCULAR | Status: AC
  Filled 2024-07-27: qty 5

## 2024-07-27 MED ORDER — FENTANYL CITRATE (PF) 250 MCG/5ML IJ SOLN
INTRAMUSCULAR | Status: DC | PRN
  Administered 2024-07-27 (×2): 50 ug via INTRAVENOUS

## 2024-07-27 MED ORDER — OXYCODONE HCL 5 MG/5ML PO SOLN: 5.0000 mg | Freq: Once | ORAL | Status: AC | PRN

## 2024-07-27 MED ORDER — ORAL CARE MOUTH RINSE: 15.0000 mL | Freq: Once | OROMUCOSAL | Status: AC

## 2024-07-27 MED ORDER — CEFAZOLIN SODIUM-DEXTROSE 2-4 GM/100ML-% IV SOLN
2.0000 g | INTRAVENOUS | Status: AC
  Administered 2024-07-27: 2 g via INTRAVENOUS
  Filled 2024-07-27: qty 100

## 2024-07-27 MED ORDER — HEMOSTATIC AGENTS (NO CHARGE) OPTIME
TOPICAL | Status: DC | PRN
  Administered 2024-07-27: 1 via TOPICAL

## 2024-07-27 MED ORDER — HYDROMORPHONE HCL 1 MG/ML IJ SOLN
0.2500 mg | INTRAMUSCULAR | Status: DC | PRN
  Administered 2024-07-27: 0.25 mg via INTRAVENOUS

## 2024-07-27 MED ORDER — INSULIN ASPART 100 UNIT/ML IJ SOLN: 0.0000 [IU] | INTRAMUSCULAR | Status: DC | PRN

## 2024-07-27 MED ORDER — FENTANYL CITRATE (PF) 100 MCG/2ML IJ SOLN
INTRAMUSCULAR | Status: AC
  Filled 2024-07-27: qty 2

## 2024-07-27 MED ORDER — 0.9 % SODIUM CHLORIDE (POUR BTL) OPTIME
TOPICAL | Status: DC | PRN
  Administered 2024-07-27: 1000 mL

## 2024-07-27 MED ORDER — HYDROMORPHONE HCL 1 MG/ML IJ SOLN
INTRAMUSCULAR | Status: AC
  Filled 2024-07-27: qty 1

## 2024-07-27 MED ORDER — PROPOFOL 10 MG/ML IV BOLUS
INTRAVENOUS | Status: DC | PRN
  Administered 2024-07-27: 150 mg via INTRAVENOUS
  Administered 2024-07-27 (×2): 50 mg via INTRAVENOUS

## 2024-07-27 MED ORDER — SUGAMMADEX SODIUM 200 MG/2ML IV SOLN
INTRAVENOUS | Status: DC | PRN
  Administered 2024-07-27 (×2): 200 mg via INTRAVENOUS

## 2024-07-27 MED ORDER — HEPARIN 6000 UNIT IRRIGATION SOLUTION
Status: AC
  Filled 2024-07-27: qty 500

## 2024-07-27 MED ORDER — CHLORHEXIDINE GLUCONATE 0.12 % MT SOLN
15.0000 mL | Freq: Once | OROMUCOSAL | Status: AC
  Administered 2024-07-27: 15 mL via OROMUCOSAL
  Filled 2024-07-27: qty 15

## 2024-07-27 MED ORDER — CALCIUM GLUCONATE 10 % IV SOLN
INTRAVENOUS | Status: DC | PRN
  Administered 2024-07-27: 200 mg via INTRAVENOUS

## 2024-07-27 MED ORDER — ESMOLOL HCL 100 MG/10ML IV SOLN
INTRAVENOUS | Status: DC | PRN
  Administered 2024-07-27: 15 mg via INTRAVENOUS

## 2024-07-27 MED ORDER — CHLORHEXIDINE GLUCONATE 4 % EX SOLN: 60.0000 mL | Freq: Once | CUTANEOUS | Status: DC

## 2024-07-27 MED ORDER — OXYCODONE HCL 5 MG PO TABS
ORAL_TABLET | ORAL | Status: AC
  Filled 2024-07-27: qty 1

## 2024-07-27 MED ORDER — AMISULPRIDE (ANTIEMETIC) 5 MG/2ML IV SOLN: 10.0000 mg | Freq: Once | INTRAVENOUS | Status: DC | PRN

## 2024-07-27 MED ORDER — OXYCODONE HCL 5 MG PO TABS
5.0000 mg | ORAL_TABLET | Freq: Once | ORAL | Status: AC | PRN
  Administered 2024-07-27: 5 mg via ORAL

## 2024-07-27 MED ORDER — PROPOFOL 10 MG/ML IV BOLUS
INTRAVENOUS | Status: AC
  Filled 2024-07-27: qty 20

## 2024-07-27 MED ORDER — TRAMADOL HCL 50 MG PO TABS: 50.0000 mg | ORAL_TABLET | Freq: Four times a day (QID) | ORAL | 0 refills | Status: AC | PRN

## 2024-07-27 MED ORDER — LABETALOL HCL 5 MG/ML IV SOLN
INTRAVENOUS | Status: DC | PRN
  Administered 2024-07-27: 5 mg via INTRAVENOUS

## 2024-07-27 MED ORDER — ROCURONIUM BROMIDE 10 MG/ML (PF) SYRINGE
PREFILLED_SYRINGE | INTRAVENOUS | Status: DC | PRN
  Administered 2024-07-27: 20 mg via INTRAVENOUS
  Administered 2024-07-27: 50 mg via INTRAVENOUS

## 2024-07-27 MED ORDER — LIDOCAINE 2% (20 MG/ML) 5 ML SYRINGE
INTRAMUSCULAR | Status: DC | PRN
  Administered 2024-07-27: 60 mg via INTRAVENOUS

## 2024-07-27 NOTE — Discharge Instructions (Signed)
 "  [facility]  Discharge Instructions  AV Fistula or Graft Surgery for Dialysis Access  Please refer to the following instructions for your post-procedure care. Your surgeon or physician assistant will discuss any changes with you.  Activity  You may drive the day following your surgery, if you are comfortable and no longer taking prescription pain medication. Resume full activity as the soreness in your incision resolves.  Bathing/Showering  You may shower after you go home. Keep your incision dry for 48 hours. Do not soak in a bathtub, hot tub, or swim until the incision heals completely. You may not shower if you have a hemodialysis catheter.  Incision Care  Clean your incision with mild soap and water after 48 hours. Pat the area dry with a clean towel. You do not need a bandage unless otherwise instructed. Do not apply any ointments or creams to your incision. You may have skin glue on your incision. Do not peel it off. It will come off on its own in about one week. Your arm may swell a bit after surgery. To reduce swelling use pillows to elevate your arm so it is above your heart. Your doctor will tell you if you need to lightly wrap your arm with an ACE bandage.  Diet  Resume your normal diet. There are not special food restrictions following this procedure. In order to heal from your surgery, it is CRITICAL to get adequate nutrition. Your body requires vitamins, minerals, and protein. Vegetables are the best source of vitamins and minerals. Vegetables also provide the perfect balance of protein. Processed food has little nutritional value, so try to avoid this.  Medications  Resume taking all of your medications. If your incision is causing pain, you may take over-the counter pain relievers such as acetaminophen  (Tylenol ). If you were prescribed a stronger pain medication, please be aware these medications can cause nausea and constipation. Prevent  nausea by taking the medication with a snack or meal. Avoid constipation by drinking plenty of fluids and eating foods with high amount of fiber, such as fruits, vegetables, and grains.  Do not take Tylenol  if you are taking prescription pain medications.  Follow up Your surgeon may want to see you in the office following your access surgery. If so, this will be arranged at the time of your surgery.  Please call us  immediately for any of the following conditions:  Increased pain, redness, drainage (pus) from your incision site Fever of 101 degrees or higher Severe or worsening pain at your incision site Hand pain or numbness.  Reduce your risk of vascular disease:  Stop smoking. If you would like help, call [facility] at 1-800-QUIT-NOW (7122706206) or [facility] at 913-719-2086  Manage your cholesterol Maintain a desired weight Control your diabetes Keep your blood pressure down  Dialysis  It will take several weeks to several months for your new dialysis access to be ready for use. Your surgeon will determine when it is okay to use it. Your nephrologist will continue to direct your dialysis. You can continue to use your Permcath until your new access is ready for use.   07/27/2024 Christopher Lopez [id number] [birth year]  Surgeon(s): Sheree Penne Bruckner, MD  Procedures: ARTERIOVENOUS (AV) FISTULA CREATION INSERTION, GRAFT, ARTERIOVENOUS, UPPER EXTREMITY   May stick graft immediately   May stick graft on designated area only:   X Do not stick right AV fistula for 12 weeks    If you have any questions,  please call the office at [phone]. "

## 2024-07-27 NOTE — Transfer of Care (Signed)
 Immediate Anesthesia Transfer of Care Note  Patient: Christopher Lopez  Procedure(s) Performed: ARTERIOVENOUS (AV) FISTULA CREATION (Right) INSERTION, GRAFT, ARTERIOVENOUS, UPPER EXTREMITY (Right)  Patient Location: PACU  Anesthesia Type:General  Level of Consciousness: drowsy  Airway & Oxygen Therapy: Patient Spontanous Breathing  Post-op Assessment: Report given to RN  Post vital signs: Reviewed and stable  Last Vitals:  Vitals Value Taken Time  BP 169/88 07/27/24 12:30  Temp 36.7 C 07/27/24 12:28  Pulse 97 07/27/24 12:33  Resp 21 07/27/24 12:33  SpO2 96 % 07/27/24 12:33  Vitals shown include unfiled device data.  Last Pain:  Vitals:   07/27/24 1230  TempSrc:   PainSc: 4          Complications: No notable events documented.

## 2024-07-27 NOTE — Anesthesia Postprocedure Evaluation (Signed)
"   Anesthesia Post Note  Patient: Christopher Lopez  Procedure(s) Performed: ARTERIOVENOUS (AV) FISTULA CREATION (Right) INSERTION, GRAFT, ARTERIOVENOUS, UPPER EXTREMITY (Right)     Patient location during evaluation: PACU Anesthesia Type: General Level of consciousness: awake and alert Pain management: pain level controlled Vital Signs Assessment: post-procedure vital signs reviewed and stable Respiratory status: spontaneous breathing, nonlabored ventilation and respiratory function stable Cardiovascular status: blood pressure returned to baseline and stable Postop Assessment: no apparent nausea or vomiting Anesthetic complications: no   No notable events documented.  Last Vitals:  Vitals:   07/27/24 1330 07/27/24 1334  BP: (!) 178/96 (!) 185/93  Pulse: 96   Resp: 13   Temp: 36.8 C   SpO2: 97%     Last Pain:  Vitals:   07/27/24 1330  TempSrc:   PainSc: 4                  Butler Levander Pinal      "

## 2024-07-27 NOTE — H&P (Signed)
 "   HPI: Christopher Lopez is a 64 y.o. male who is here for postop visit.  He recently underwent placement of a left IJ TDC and ligation and excision of a left upper extremity AV graft on 04/13/2024 by Dr. Sheree.  This was done for a degenerated AV graft with risk of rupture.   He returns today for follow-up.  He has no complaints at today's office visit.  He denies any issues with his left arm incision such as drainage, tenderness, or dehiscence.  He denies any weakness or numbness in the left hand.  He has been dialyzing successfully through his left IJ TDC.   He dialyzes on T/Th/Sat.     Allergies       Allergies  Allergen Reactions   Lipitor [Atorvastatin] Other (See Comments)      pain              Current Outpatient Medications  Medication Sig Dispense Refill   acetaminophen  (TYLENOL ) 650 MG CR tablet Take 1,300 mg by mouth every 8 (eight) hours as needed for pain.       amLODipine  (NORVASC ) 2.5 MG tablet Take 1 tablet (2.5 mg total) by mouth daily. 90 tablet 3   aspirin  81 MG EC tablet Take 81 mg by mouth daily.       cinacalcet  (SENSIPAR ) 60 MG tablet Take 120 mg by mouth daily.       diphenhydrAMINE  (BENADRYL ) 25 mg capsule Take 75 mg by mouth daily as needed for itching.       Doxercalciferol  (HECTOROL  IV) Doxercalciferol  (Hectorol )       Dulaglutide  (TRULICITY ) 1.5 MG/0.5ML SOAJ Inject 1.5 mg into the skin once a week. 6 mL 3   glucose blood (ONE TOUCH ULTRA TEST) test strip Use as instructed once daily E11.9 100 each 12   Lancets (ONETOUCH ULTRASOFT) lancets 1 each by Other route 2 (two) times daily. Use to check blood sugars once a day Dx e11.9 100 each 3   losartan  (COZAAR ) 50 MG tablet Take 50 mg by mouth daily.       Methoxy PEG-Epoetin  Beta (MIRCERA IJ) 75 mcg.       metoprolol  succinate (TOPROL -XL) 50 MG 24 hr tablet TAKE 1 TABLET BY MOUTH EVERY DAY  WITH OR IMMEDIATELY FOLLOWING A MEAL 90 tablet 3   oxyCODONE -acetaminophen  (PERCOCET/ROXICET) 5-325 MG tablet Take 1  tablet by mouth every 6 (six) hours as needed for moderate pain (pain score 4-6). 15 tablet 0   rosuvastatin  (CRESTOR ) 20 MG tablet TAKE 1 TABLET BY MOUTH EVERY DAY 90 tablet 3   VELPHORO 500 MG chewable tablet Chew 1,500 mg by mouth daily.          No current facility-administered medications for this visit.         ROS:  See HPI   Physical Exam:   Vitals:   07/27/24 0929  BP: 133/88  Pulse: (!) 104  Resp: 18  Temp: 99.3 F (37.4 C)  SpO2: 98%    Incision: Left upper arm incision well-healed without signs of infection or dehiscence, all staples removed Extremities: Palpable radial pulses bilaterally Neuro: Intact motor and sensation of bilateral upper extremities   Studies: Vein mapping RUE: +-----------------+-------------+----------+-------------------------+  Right Cephalic   Diameter (cm)Depth (cm)        Findings           +-----------------+-------------+----------+-------------------------+  Shoulder            0.16  0.73                              +-----------------+-------------+----------+-------------------------+  Prox upper arm       0.15        0.66                              +-----------------+-------------+----------+-------------------------+  Mid upper arm        0.14        0.55                              +-----------------+-------------+----------+-------------------------+  Dist upper arm       0.11        0.47                              +-----------------+-------------+----------+-------------------------+  Antecubital fossa    0.14        0.50                              +-----------------+-------------+----------+-------------------------+  Prox forearm         0.11        0.31   possible chronic thrombus  +-----------------+-------------+----------+-------------------------+  Mid forearm          0.08        0.39   possible chronic thrombus   +-----------------+-------------+----------+-------------------------+  Dist forearm         0.11        0.40                              +-----------------+-------------+----------+-------------------------+   +-----------------+-------------+----------+--------+  Right Basilic    Diameter (cm)Depth (cm)Findings  +-----------------+-------------+----------+--------+  Mid upper arm        0.23        0.71             +-----------------+-------------+----------+--------+  Dist upper arm       0.24        0.53             +-----------------+-------------+----------+--------+  Antecubital fossa    0.20        0.41             +-----------------+-------------+----------+--------+        Assessment/Plan:  This is a 64 y.o. male who is status post left IJ [facility] placement and ligation and excision of left lower extremity AV graft.  We have discussed proceeding with right upper extremity access he has marginal vein for fistula and we discussed that he will more than likely require graft.  He demonstrates good understanding in the presence of his brother consent was signed for AV fistula versus graft today in the OR.  Valisa Karpel C. Sheree, MD [facility] [place] Office: [phone] Pager: [phone]    "

## 2024-07-27 NOTE — Anesthesia Procedure Notes (Signed)
 Procedure Name: Intubation Date/Time: 07/27/2024 11:10 AM  Performed by: Cleotilde Butler Dade, MDPre-anesthesia Checklist: Patient identified, Emergency Drugs available, Suction available and Patient being monitored Patient Re-evaluated:Patient Re-evaluated prior to induction Oxygen Delivery Method: Circle System Utilized Preoxygenation: Pre-oxygenation with 100% oxygen Induction Type: IV induction Ventilation: Mask ventilation without difficulty Laryngoscope Size: Mac and 4 Grade View: Grade II Tube type: Oral Number of attempts: 2 (second attempt by MD with bougie successful) Airway Equipment and Method: Stylet and Oral airway Placement Confirmation: ETT inserted through vocal cords under direct vision, positive ETCO2 and breath sounds checked- equal and bilateral Secured at: 21 cm Tube secured with: Tape Dental Injury: Teeth and Oropharynx as per pre-operative assessment  Difficulty Due To: Difficult Airway- due to anterior larynx

## 2024-07-27 NOTE — Op Note (Signed)
" ° ° °  Patient name: Christopher Lopez MRN: [id number] DOB: 29-Dec-1960 Sex: male  07/27/2024 Pre-operative Diagnosis: End-stage renal disease Post-operative diagnosis:  Same Surgeon:  Penne C. Sheree, MD  Assistant: Curry Damme, PA Procedure Performed: Right brachial artery to basilic vein AV fistula creation   Indications: 64 year old male with history of end-stage renal disease previously on dialysis via left upper arm AV graft which has been ligated and excised.  He is now dialyzing on Tuesdays, Thursdays and Saturdays via tunneled dialysis catheter.  He is indicated for right upper extremity fistula versus graft creation.  Given the complexity of the case,  the assistant was necessary in order to expedite the procedure and safely perform the technical aspects of the operation.  The assistant provided traction and countertraction to assist with exposure of the artery and vein.  They also assisted with suture ligation of multiple venous branches.  They played a critical role in the anastomosis. These skills, especially following the Prolene suture for the anastomosis, could not have been adequately performed by a scrub tech assistant.    Findings: The right basilic vein above the antecubitum measured 3 mm and was dilated to 3-1/2 mm and brachial artery measured measured 5 mm and was free of disease.  At completion there was very strong thrill in the basilic vein which did join the deep system in the mid upper arm and there was a palpable right radial pulse.   Procedure:  The patient was identified in the holding area and taken to the operating room where he was placed supine operative table and general anesthesia was induced.  He was sterilely prepped and draped in the right upper extremity in the usual fashion, antibiotics were administered and a timeout was called.  Using ultrasound we evaluated his right upper extremity veins.  The cephalic vein of the antecubitum measured 3 mm but was very  diminutive above this.  The basilic vein measured 3 mm and did join the deep system in the mid upper arm but appeared suitable for fistula creation.  A transverse incision was created between the basilic vein and the palpable brachial pulse.  We dissected out of the vein divided branches between ties and marked if orientation.  We dissected through the deep fascia to the very large brachial artery encircled this with vessel loop.  The vein was then tied off distally and transected and spatulated and dilated to 3.5 mm and flushed with heparinized saline and clamped.  The artery was clamped distally and proximally opened longitudinally and flushed with heparinized saline distally only given his large size.  The vein was spatulated and sewn end to side with 6-0 Prolene suture.  Prior to completion we allow flushing in all directions and then released the clamps and placed 1 repair suture.  There was a very strong thrill in the fistula confirmed with Doppler no palpable radial artery pulse the wrist also confirmed with Doppler.  The wound was then irrigated and hemostasis was obtained.  The wound was closed with Vicryl and Monocryl and Dermabond at the skin level.  He was awakened from anesthesia having tolerated the procedure well without immediate complication.  All counts were correct at completion.  EBL: 20 cc    Wojciech Willetts C. Sheree, MD [facility] [place] Office: [phone] Pager: [phone]   "

## 2024-07-27 NOTE — Progress Notes (Signed)
 Notified Butler Pinal of Istat result (K 6.7). Will continue with surgery. Dr. Sheree notified as well.

## 2024-07-28 ENCOUNTER — Encounter (HOSPITAL_COMMUNITY): Payer: Self-pay | Admitting: Vascular Surgery

## 2024-08-09 ENCOUNTER — Other Ambulatory Visit: Payer: Self-pay | Admitting: Vascular Surgery

## 2024-08-09 DIAGNOSIS — N186 End stage renal disease: Secondary | ICD-10-CM

## 2024-08-14 NOTE — Telephone Encounter (Unsigned)
 Copied from CRM #[id number]. Topic: General - Other >> Aug 14, 2024  4:54 PM Delon DASEN wrote: Reason for CRM: need an update on the paperwork that was sent in for  [facility]- please call [phone]

## 2024-08-15 ENCOUNTER — Ambulatory Visit: Payer: Self-pay

## 2024-08-15 NOTE — Telephone Encounter (Signed)
 FYI Only or Action Required?: FYI only for provider: ED advised.  Patient was last seen in primary care on 06/01/2024 by Norleen Lynwood ORN, MD.  Called Nurse Triage reporting Nausea.  Symptoms began several weeks ago.  Interventions attempted: Nothing.  Symptoms are: gradually worsening.  Triage Disposition: Go to ED Now (Notify PCP)  Patient/caregiver understands and will follow disposition?: Unsure  Reason for Disposition  Unable to walk, or can only walk with assistance (e.g., requires support)  Answer Assessment - Initial Assessment Questions Pt's brother Cal) calling to report the pt has been having nausea, constantly going to the bathroom, only consuming warm liquids (coffee and soup) to soothe stomach, losing weight, losing balance when he walks - onset 3 weeks. Pt normally ambulates with cane, but has required assistance lately d/t pt feeling very weak. Pt vomited 1x last week and 1x the week before. Gerre states the pt has been receiving dialysis for 13 years - dialysis 3x week.   Per protocol, pt advised to go to the ED - Gerre states he will try to get the pt to the ED today or tomorrow. Writer advised that the pt go to the ED today - Garland verbalizes understanding.   1. NAUSEA SEVERITY: How bad is the nausea? (e.g., mild, moderate, severe; dehydration, weight loss)     Moderate  2. ONSET: When did the nausea begin?     3 weeks ago 3. VOMITING: Any vomiting? If Yes, ask: How many times today?     Yes - 1x last week, and 1x the week before 4. RECURRENT SYMPTOM: Have you had nausea before? If Yes, ask: When was the last time? What happened that time?     N/a 5. CAUSE: What do you think is causing the nausea?     N/a  Protocols used: Nausea-A-AH  Reason for Triage: has been dialysis over 13 years, appetite changed drastically, stomach issues, hard to keep food down, can only keep down warm liquid food, losing weight, and losing balance.

## 2024-08-16 NOTE — Telephone Encounter (Signed)
 Called and spoke with Pt representative & let him know we never received any paperwork from this patient & I let him know since Dr.Daquon is retired he would need to see another provider to get the forms filled out, Pt representative would like to know if he can be scheduled for an acute visit to have the forms filled out by another provider I stated I would ask my manager and reach back out to him.

## 2024-08-22 ENCOUNTER — Ambulatory Visit: Admitting: Podiatry

## 2024-09-05 ENCOUNTER — Encounter

## 2024-09-05 ENCOUNTER — Ambulatory Visit (HOSPITAL_COMMUNITY)

## 2024-10-17 ENCOUNTER — Ambulatory Visit

## 2024-11-19 ENCOUNTER — Encounter: Admitting: Family Medicine
